# Patient Record
Sex: Female | Born: 1940
Health system: Southern US, Community
[De-identification: ages and names within clinical notes are randomized; demographics above are authoritative.]

## PROBLEM LIST (undated history)

## (undated) DIAGNOSIS — E119 Type 2 diabetes mellitus without complications: Secondary | ICD-10-CM

## (undated) DIAGNOSIS — D649 Anemia, unspecified: Secondary | ICD-10-CM

## (undated) DIAGNOSIS — I2510 Atherosclerotic heart disease of native coronary artery without angina pectoris: Secondary | ICD-10-CM

## (undated) DIAGNOSIS — I219 Acute myocardial infarction, unspecified: Secondary | ICD-10-CM

## (undated) DIAGNOSIS — N186 End stage renal disease: Secondary | ICD-10-CM

## (undated) DIAGNOSIS — E785 Hyperlipidemia, unspecified: Secondary | ICD-10-CM

## (undated) DIAGNOSIS — Z992 Dependence on renal dialysis: Secondary | ICD-10-CM

## (undated) DIAGNOSIS — I10 Essential (primary) hypertension: Secondary | ICD-10-CM

## (undated) DIAGNOSIS — I639 Cerebral infarction, unspecified: Secondary | ICD-10-CM

## (undated) DIAGNOSIS — I509 Heart failure, unspecified: Secondary | ICD-10-CM

## (undated) HISTORY — DX: End stage renal disease: N18.6

## (undated) HISTORY — PX: APPENDECTOMY: SHX54

## (undated) HISTORY — DX: Dependence on renal dialysis: Z99.2

---
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
## 2012-07-17 ENCOUNTER — Inpatient Hospital Stay (HOSPITAL_COMMUNITY): Payer: Medicare Other

## 2012-07-17 ENCOUNTER — Encounter (HOSPITAL_COMMUNITY): Payer: Self-pay | Admitting: Emergency Medicine

## 2012-07-17 ENCOUNTER — Inpatient Hospital Stay (HOSPITAL_COMMUNITY)
Admission: EM | Admit: 2012-07-17 | Discharge: 2012-07-22 | DRG: 481 | Disposition: A | Discharge status: Skilled Nursing Facility | Payer: Medicare Other | Attending: Internal Medicine | Admitting: Internal Medicine

## 2012-07-17 ENCOUNTER — Emergency Department (HOSPITAL_COMMUNITY): Payer: Medicare Other

## 2012-07-17 DIAGNOSIS — I129 Hypertensive chronic kidney disease with stage 1 through stage 4 chronic kidney disease, or unspecified chronic kidney disease: Secondary | ICD-10-CM | POA: Diagnosis present

## 2012-07-17 DIAGNOSIS — N390 Urinary tract infection, site not specified: Secondary | ICD-10-CM | POA: Diagnosis present

## 2012-07-17 DIAGNOSIS — Z8673 Personal history of transient ischemic attack (TIA), and cerebral infarction without residual deficits: Secondary | ICD-10-CM

## 2012-07-17 DIAGNOSIS — S72001A Fracture of unspecified part of neck of right femur, initial encounter for closed fracture: Secondary | ICD-10-CM

## 2012-07-17 DIAGNOSIS — Y92009 Unspecified place in unspecified non-institutional (private) residence as the place of occurrence of the external cause: Secondary | ICD-10-CM

## 2012-07-17 DIAGNOSIS — E876 Hypokalemia: Secondary | ICD-10-CM | POA: Diagnosis not present

## 2012-07-17 DIAGNOSIS — E875 Hyperkalemia: Secondary | ICD-10-CM | POA: Diagnosis present

## 2012-07-17 DIAGNOSIS — D631 Anemia in chronic kidney disease: Secondary | ICD-10-CM | POA: Diagnosis present

## 2012-07-17 DIAGNOSIS — S72141A Displaced intertrochanteric fracture of right femur, initial encounter for closed fracture: Secondary | ICD-10-CM

## 2012-07-17 DIAGNOSIS — R55 Syncope and collapse: Secondary | ICD-10-CM

## 2012-07-17 DIAGNOSIS — W19XXXA Unspecified fall, initial encounter: Secondary | ICD-10-CM | POA: Diagnosis present

## 2012-07-17 DIAGNOSIS — S72143A Displaced intertrochanteric fracture of unspecified femur, initial encounter for closed fracture: Principal | ICD-10-CM | POA: Diagnosis present

## 2012-07-17 DIAGNOSIS — S72009A Fracture of unspecified part of neck of unspecified femur, initial encounter for closed fracture: Secondary | ICD-10-CM

## 2012-07-17 DIAGNOSIS — S72001D Fracture of unspecified part of neck of right femur, subsequent encounter for closed fracture with routine healing: Secondary | ICD-10-CM

## 2012-07-17 DIAGNOSIS — W010XXA Fall on same level from slipping, tripping and stumbling without subsequent striking against object, initial encounter: Secondary | ICD-10-CM

## 2012-07-17 DIAGNOSIS — B961 Klebsiella pneumoniae [K. pneumoniae] as the cause of diseases classified elsewhere: Secondary | ICD-10-CM | POA: Diagnosis present

## 2012-07-17 DIAGNOSIS — I679 Cerebrovascular disease, unspecified: Secondary | ICD-10-CM | POA: Diagnosis present

## 2012-07-17 DIAGNOSIS — I10 Essential (primary) hypertension: Secondary | ICD-10-CM | POA: Diagnosis present

## 2012-07-17 DIAGNOSIS — E119 Type 2 diabetes mellitus without complications: Secondary | ICD-10-CM | POA: Diagnosis present

## 2012-07-17 DIAGNOSIS — N189 Chronic kidney disease, unspecified: Secondary | ICD-10-CM | POA: Diagnosis present

## 2012-07-17 HISTORY — DX: Essential (primary) hypertension: I10

## 2012-07-17 HISTORY — DX: Type 2 diabetes mellitus without complications: E11.9

## 2012-07-17 LAB — CBC
HCT: 29.6 % — ABNORMAL LOW (ref 36.0–46.0)
MCHC: 34.8 g/dL (ref 30.0–36.0)
MCV: 83.4 fL (ref 78.0–100.0)
RDW: 13.2 % (ref 11.5–15.5)
WBC: 6.8 10*3/uL (ref 4.0–10.5)

## 2012-07-17 LAB — PROTIME-INR
INR: 1.03 (ref 0.00–1.49)
Prothrombin Time: 13.4 seconds (ref 11.6–15.2)

## 2012-07-17 LAB — URINALYSIS, ROUTINE W REFLEX MICROSCOPIC
Glucose, UA: NEGATIVE mg/dL
Specific Gravity, Urine: 1.02 (ref 1.005–1.030)

## 2012-07-17 LAB — COMPREHENSIVE METABOLIC PANEL
Albumin: 3.4 g/dL — ABNORMAL LOW (ref 3.5–5.2)
BUN: 20 mg/dL (ref 6–23)
Chloride: 102 mEq/L (ref 96–112)
Creatinine, Ser: 1.75 mg/dL — ABNORMAL HIGH (ref 0.50–1.10)
Total Bilirubin: 0.6 mg/dL (ref 0.3–1.2)

## 2012-07-17 LAB — GLUCOSE, CAPILLARY: Glucose-Capillary: 128 mg/dL — ABNORMAL HIGH (ref 70–99)

## 2012-07-17 LAB — URINE MICROSCOPIC-ADD ON

## 2012-07-17 LAB — TROPONIN I: Troponin I: <0.3 ng/mL (ref <0.30)

## 2012-07-17 MED ORDER — POTASSIUM CHLORIDE IN NACL 20-0.9 MEQ/L-% IV SOLN
INTRAVENOUS | Status: DC
  Administered 2012-07-17 – 2012-07-21 (×5): via INTRAVENOUS

## 2012-07-17 MED ORDER — GLIMEPIRIDE 2 MG PO TABS
2.0000 mg | ORAL_TABLET | Freq: Every day | ORAL | Status: DC
  Administered 2012-07-18 – 2012-07-22 (×5): 2 mg via ORAL
  Filled 2012-07-17 (×8): qty 1

## 2012-07-17 MED ORDER — MORPHINE SULFATE 2 MG/ML IJ SOLN
2.0000 mg | Freq: Once | INTRAMUSCULAR | Status: AC
Start: 2012-07-17 — End: 2012-07-17
  Administered 2012-07-17: 2 mg via INTRAVENOUS
  Filled 2012-07-17: qty 1

## 2012-07-17 MED ORDER — ONDANSETRON HCL 4 MG/2ML IJ SOLN
4.0000 mg | Freq: Once | INTRAMUSCULAR | Status: AC
  Administered 2012-07-17: 4 mg via INTRAVENOUS
  Filled 2012-07-17: qty 2

## 2012-07-17 MED ORDER — HEPARIN SODIUM (PORCINE) 5000 UNIT/ML IJ SOLN
5000.0000 [IU] | Freq: Three times a day (TID) | INTRAMUSCULAR | Status: DC
  Administered 2012-07-17 – 2012-07-18 (×3): 5000 [IU] via SUBCUTANEOUS
  Filled 2012-07-17 (×3): qty 1

## 2012-07-17 MED ORDER — INSULIN ASPART 100 UNIT/ML ~~LOC~~ SOLN: 0.0000 [IU] | Freq: Every day | SUBCUTANEOUS | Status: DC

## 2012-07-17 MED ORDER — AMLODIPINE BESYLATE 5 MG PO TABS
5.0000 mg | ORAL_TABLET | Freq: Every day | ORAL | Status: DC
  Administered 2012-07-18 – 2012-07-22 (×5): 5 mg via ORAL
  Filled 2012-07-17 (×5): qty 1

## 2012-07-17 MED ORDER — LABETALOL HCL 200 MG PO TABS
100.0000 mg | ORAL_TABLET | Freq: Two times a day (BID) | ORAL | Status: DC
  Administered 2012-07-17 – 2012-07-22 (×10): 100 mg via ORAL
  Filled 2012-07-17 (×14): qty 1

## 2012-07-17 MED ORDER — MORPHINE SULFATE 2 MG/ML IJ SOLN
2.0000 mg | Freq: Once | INTRAMUSCULAR | Status: AC
  Administered 2012-07-17: 2 mg via INTRAVENOUS
  Filled 2012-07-17 (×2): qty 1

## 2012-07-17 MED ORDER — OXYCODONE HCL 5 MG PO TABS
5.0000 mg | ORAL_TABLET | ORAL | Status: DC | PRN
  Administered 2012-07-18 (×3): 5 mg via ORAL
  Filled 2012-07-17 (×2): qty 1

## 2012-07-17 MED ORDER — MORPHINE SULFATE 2 MG/ML IJ SOLN
2.0000 mg | INTRAMUSCULAR | Status: DC | PRN
  Administered 2012-07-18 – 2012-07-21 (×8): 2 mg via INTRAVENOUS
  Filled 2012-07-17 (×8): qty 1

## 2012-07-17 MED ORDER — SODIUM CHLORIDE 0.9 % IJ SOLN
3.0000 mL | Freq: Two times a day (BID) | INTRAMUSCULAR | Status: DC
  Administered 2012-07-19 – 2012-07-22 (×6): 3 mL via INTRAVENOUS
  Filled 2012-07-17 (×2): qty 3

## 2012-07-17 MED ORDER — INSULIN ASPART 100 UNIT/ML ~~LOC~~ SOLN
0.0000 [IU] | Freq: Three times a day (TID) | SUBCUTANEOUS | Status: DC
  Administered 2012-07-18: 2 [IU] via SUBCUTANEOUS
  Administered 2012-07-18: 3 [IU] via SUBCUTANEOUS
  Administered 2012-07-19: 2 [IU] via SUBCUTANEOUS
  Administered 2012-07-19: 3 [IU] via SUBCUTANEOUS
  Administered 2012-07-21 – 2012-07-22 (×3): 2 [IU] via SUBCUTANEOUS

## 2012-07-17 NOTE — ED Notes (Signed)
Pt states that she fell this am around 5, unsure of what caused her to fall. C/o pain to right hip area. Cms intact distal.

## 2012-07-17 NOTE — ED Notes (Signed)
Pt states that her pain is starting to return,

## 2012-07-17 NOTE — ED Notes (Signed)
Dr. James Ivanoff at bedside,

## 2012-07-17 NOTE — H&P (Signed)
[facility] History and Physical  Courtney [name] [id number] DOB: Jun 09, 1940 DOA: 07/17/2012  Referring physician: Dr Henreitta Cea PCP: Neale Burly, MD    Chief Complaint: Syncope. Right hip pain.  HPI: Courtney [name] is a 72 y.o. female who had a syncopal episode at 5 AM today. She was in the process of going from her bed to the bathroom when she suddenly lost consciousness. According to her and her son's history, she probably lost consciousness for less than 1 minute. She denies any chest pain, lightheadedness, palpitations with this episode. When she recovered consciousness, she does not remember feeling confused and her son denies that she was confused. She does tell me that she has episodes when she feels her heart stops for a few seconds. She has never had any history of heart disease. 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 She walks with a walker usually. Once she was on the bathroom floor, she was not able to get up to cause she had severe pain in the right hip. She came to the emergency room and x-ray showed fractured right hip. She is diabetic and hypertensive.   Review of Systems: .  Apart from history of present illness, other systems negative.  Past Medical History  Diagnosis Date  . Diabetes mellitus without complication   . Hypertension    Past Surgical History  Procedure Laterality Date  . Appendectomy     Social History:  She never married, lives with her son. She does not drink alcohol. She does not smoke cigarettes. She walks with a walker, seems to be fairly independent.  No Known Allergies  History reviewed. No pertinent family history.  noncontributory.   Prior to Admission medications   Medication Sig Start Date End Date Taking? Authorizing Provider  amLODipine (NORVASC) 5 MG tablet Take 5 mg by mouth daily.   Yes Historical Provider, MD  aspirin 325  MG EC tablet Take 325 mg by mouth daily.   Yes Historical Provider, MD  furosemide (LASIX) 20 MG tablet Take 20 mg by mouth daily.   Yes Historical Provider, MD  glimepiride (AMARYL) 2 MG tablet Take 2 mg by mouth daily.   Yes Historical Provider, MD  labetalol (NORMODYNE) 100 MG tablet Take 100 mg by mouth 2 (two) times daily.   Yes Historical Provider, MD  Omega-3 Fatty Acids (FISH OIL) 1000 MG CAPS Take 1,000 mg by mouth 2 (two) times daily.   Yes Historical Provider, MD   Physical Exam: Filed Vitals:   07/17/12 1519  BP: 172/60  Pulse: 65  Temp: 98.8 F (37.1 C)  TempSrc: Oral  Resp: 18  Height: 5\' 2"  (1.575 m)  Weight: 45.36 kg (100 lb)  SpO2: 100%     General:  She looks systemically well. She is alert.  Eyes: No pallor. No jaundice.  ENT: No abnormalities.  Neck: No lymphadenopathy.  Cardiovascular: Heart sounds are present and in sinus rhythm. There are no murmurs. There is no gallop rhythm. Jugular venous pressure not raised. No carotid bruits.  Respiratory: Lung fields are clear.  Abdomen: Soft, nontender.  Skin: No rash.  Musculoskeletal: Right leg is shortened and externally rotated, consistent with right hip fracture.  Psychiatric: Appropriate affect.  Neurologic: Weakness in the right arm and right leg consistent with previous CVA. No cranial nerve abnormalities.  Labs on Admission:  Basic Metabolic Panel:  Recent Labs Lab 07/17/12  1550  NA 140  K 3.4*  CL 102  CO2 27  GLUCOSE 211*  BUN 20  CREATININE 1.75*  CALCIUM 9.2   Liver Function Tests:  Recent Labs Lab 07/17/12 1550  AST 22  ALT 18  ALKPHOS 111  BILITOT 0.6  PROT 7.2  ALBUMIN 3.4*     CBC:  Recent Labs Lab 07/17/12 1550  WBC 6.8  HGB 10.3*  HCT 29.6*  MCV 83.4  PLT 193      Radiological Exams on Admission: Dg Chest 1 View  07/17/2012   *RADIOLOGY REPORT*  Clinical Data: Right hip fracture from a fall.  CHEST - 1 VIEW  Comparison: None.  Findings: Poor  inspiration.  Mildly enlarged cardiac silhouette with prominent pulmonary vasculature.  Mildly prominent interstitial markings.  The patient's chin is overlying the upper right lung apex.  Thoracic spine degenerative changes.  Diffuse osteopenia.  IMPRESSION:  1.  Pulmonary vascular congestion and mild cardiomegaly. 2.  Mild chronic interstitial lung disease.   Original Report Authenticated By: Claudie Revering, M.D.   Dg Hip Complete Right  07/17/2012   *RADIOLOGY REPORT*  Clinical Data: Right hip pain following a fall.  RIGHT HIP - COMPLETE 2+ VIEW  Comparison: None.  Findings: Right upper intertrochanteric fracture with mild impaction and mild varus angulation.  Right femoral head and neck junction spur formation.  Diffuse osteopenia.  Right pelvic surgical clips.  IMPRESSION:  1.  Right intertrochanteric fracture, as described above. 2.  Mild to moderate right hip degenerative changes.   Original Report Authenticated By: Claudie Revering, M.D.    EKG: Independently reviewed. Normal sinus rhythm, partial right bundle-branch block, nonspecific T wave changes laterally. There are no acute ST-T wave changes such as ST elevation.  Assessment/Plan Active Problems:   Syncope   Closed right hip fracture   HTN (hypertension)   DM (diabetes mellitus)   Cerebrovascular disease   1. Syncope lasting less than 1 minute. Unclear etiology. 2. Right hip fracture. 3. Hypertension. 4. Type 2 diabetes mellitus. 5. Cerebrovascular disease with history of right-sided weakness. 6. Renal insufficiency,? Acute or chronic. 7. Hypokalemia.  Plan: 1. Admit to telemetry. 2.Serial cardiac enzymes. 3.Echocardiogram. 4. Bilateral carotid Dopplers. 5. Hold aspirin for the time being. 6. IV fluids. 7. Replete potassium. 8. Orthopedic consultation. Dr. Aline Brochure, orthopedics, is aware of the patient. Further recommendations will depend on patient's hospital progress.   Code Status: Full code.  Family Communication:  Discussed plan with patient at the bedside.   Disposition Plan: Will likely need disposition to rehabilitation.   Time spent: 45 minutes.  Doree Albee [facility] Pager [phone].  If 7PM-7AM, please contact night-coverage [email or web address] Password [email or web address] 07/17/2012, 5:30 PM

## 2012-07-17 NOTE — ED Provider Notes (Signed)
History    This chart was scribed for Mirna Mires, MD by Ludger Nutting, ED Scribe. This patient was seen in room APA14/[facility]14 and the patient's care was started 3:20 PM.   CSN: [id number]  Arrival date & time 07/17/12  1502   First MD Initiated Contact with Patient 07/17/12 1512      Chief Complaint  Patient presents with  . Fall  . Hip Pain     The history is provided by the patient and a relative. No language interpreter was used.    HPI Comments: Courtney [name] is a 72 y.o. female who presents to the Emergency Department complaining of a fall that occurred this morning at 5 am. States she does not use a walker going from her bedroom to her bathroom. Reports the fall was not witnessed but someone in the house heard her yell out. She complains of R hip pain and swelling. She denies hitting her head or having any LOC. She does not take any blood thinner medication. She denies chest pain, abdominal pain, vomiting. Pt has h/o DM, HTN. Denies faintness or syncope. No neck or back pain. No numbness/weakness.      Past Medical History  Diagnosis Date  . Diabetes mellitus without complication   . Hypertension     Past Surgical History  Procedure Laterality Date  . Appendectomy      History reviewed. No pertinent family history.  History  Substance Use Topics  . Smoking status: Not on file  . Smokeless tobacco: Not on file  . Alcohol Use: No    OB History   Grav Para Term Preterm Abortions TAB SAB Ect Mult Living                  Review of Systems  Constitutional: Negative for fever and chills.  HENT: Negative for neck pain.   Eyes: Negative for visual disturbance.  Respiratory: Negative for shortness of breath.   Cardiovascular: Negative for chest pain.  Gastrointestinal: Negative for vomiting and abdominal pain.  Genitourinary: Negative for flank pain.  Musculoskeletal: Positive for joint swelling and arthralgias (hip pain).  Skin: Negative for wound.   Neurological: Negative for weakness, numbness and headaches.  Hematological: Does not bruise/bleed easily.  Psychiatric/Behavioral: Negative for agitation.  All other systems reviewed and are negative.    Allergies  Review of patient's allergies indicates no known allergies.  Home Medications  No current outpatient prescriptions on file.  There were no vitals taken for this visit.  Physical Exam  Nursing note and vitals reviewed. Constitutional: She is oriented to person, place, and time. She appears well-developed and well-nourished. No distress.  HENT:  Head: Atraumatic.  Mouth/Throat: Oropharynx is clear and moist.  Eyes: Conjunctivae and EOM are normal. Pupils are equal, round, and reactive to light. No scleral icterus.  Neck: Normal range of motion. Neck supple. No tracheal deviation present.  Cardiovascular: Normal rate, regular rhythm, normal heart sounds and intact distal pulses.   Pulmonary/Chest: Effort normal and breath sounds normal. No respiratory distress. She exhibits no tenderness.  Abdominal: Soft. Normal appearance. She exhibits no distension. There is no tenderness.  Genitourinary:  No cva tenderness  Musculoskeletal: Normal range of motion. She exhibits tenderness. She exhibits no edema.  R leg flexed at the hip and shortened. R hip tenderness. Distal pulses palp bil ext.  CTLS spine, non tender, aligned, no step off.   Neurological: She is alert and oriented to person, place, and time.  bil ext,  motor intact, 5/5.   Skin: Skin is warm and dry. No rash noted.  Psychiatric: She has a normal mood and affect.    ED Course  Procedures (including critical care time)  DIAGNOSTIC STUDIES: Oxygen Saturation is 100% on room air, normal by my interpretation.    COORDINATION OF CARE: 3:28 PM Discussed treatment plan with pt at bedside and pt agreed to plan.   Results for orders placed during the hospital encounter of 07/17/12  CBC      Result Value Range    WBC 6.8  4.0 - 10.5 K/uL   RBC 3.55 (*) 3.87 - 5.11 MIL/uL   Hemoglobin 10.3 (*) 12.0 - 15.0 g/dL   HCT 29.6 (*) 36.0 - 46.0 %   MCV 83.4  78.0 - 100.0 fL   MCH 29.0  26.0 - 34.0 pg   MCHC 34.8  30.0 - 36.0 g/dL   RDW 13.2  11.5 - 15.5 %   Platelets 193  150 - 400 K/uL  COMPREHENSIVE METABOLIC PANEL      Result Value Range   Sodium 140  135 - 145 mEq/L   Potassium 3.4 (*) 3.5 - 5.1 mEq/L   Chloride 102  96 - 112 mEq/L   CO2 27  19 - 32 mEq/L   Glucose, Bld 211 (*) 70 - 99 mg/dL   BUN 20  6 - 23 mg/dL   Creatinine, Ser 1.75 (*) 0.50 - 1.10 mg/dL   Calcium 9.2  8.4 - 10.5 mg/dL   Total Protein 7.2  6.0 - 8.3 g/dL   Albumin 3.4 (*) 3.5 - 5.2 g/dL   AST 22  0 - 37 U/L   ALT 18  0 - 35 U/L   Alkaline Phosphatase 111  39 - 117 U/L   Total Bilirubin 0.6  0.3 - 1.2 mg/dL   GFR calc non Af Amer 28 (*) >90 mL/min   GFR calc Af Amer 33 (*) >90 mL/min  PROTIME-INR      Result Value Range   Prothrombin Time 13.4  11.6 - 15.2 seconds   INR 1.03  0.00 - 1.49  TYPE AND SCREEN      Result Value Range   ABO/RH(D) A POS     Antibody Screen NEG     Sample Expiration 07/20/2012     Dg Chest 1 View  07/17/2012   *RADIOLOGY REPORT*  Clinical Data: Right hip fracture from a fall.  CHEST - 1 VIEW  Comparison: None.  Findings: Poor inspiration.  Mildly enlarged cardiac silhouette with prominent pulmonary vasculature.  Mildly prominent interstitial markings.  The patient's chin is overlying the upper right lung apex.  Thoracic spine degenerative changes.  Diffuse osteopenia.  IMPRESSION:  1.  Pulmonary vascular congestion and mild cardiomegaly. 2.  Mild chronic interstitial lung disease.   Original Report Authenticated By: Claudie Revering, M.D.   Dg Hip Complete Right  07/17/2012   *RADIOLOGY REPORT*  Clinical Data: Right hip pain following a fall.  RIGHT HIP - COMPLETE 2+ VIEW  Comparison: None.  Findings: Right upper intertrochanteric fracture with mild impaction and mild varus angulation.  Right femoral  head and neck junction spur formation.  Diffuse osteopenia.  Right pelvic surgical clips.  IMPRESSION:  1.  Right intertrochanteric fracture, as described above. 2.  Mild to moderate right hip degenerative changes.   Original Report Authenticated By: Claudie Revering, M.D.       MDM  I personally performed the services described in this documentation, which was  scribed in my presence. The recorded information has been reviewed and is accurate.  Iv ns.  Morphine iv.   recheck pain improved.  Ortho md called, hospitalist called.   Will admit w hip fx temp order set.   Date: 07/17/2012  Rate: 63  Rhythm: normal sinus rhythm  QRS Axis: normal  Intervals: normal  ST/T Wave abnormalities: nonspecific ST/T changes  Conduction Disutrbances:IRBBB  Narrative Interpretation:   Old EKG Reviewed: none available     Mirna Mires, MD 07/17/12 1651

## 2012-07-17 NOTE — ED Notes (Signed)
Pt states that the pain became worse when she was being moved, is better now that she has returned to er

## 2012-07-17 NOTE — ED Notes (Signed)
Dr Steinl at bedside,  

## 2012-07-17 NOTE — ED Notes (Signed)
When asked about what made pt fall, pt states "everything went black".

## 2012-07-17 NOTE — ED Notes (Signed)
Pt has been unable to walk since fall. Pt just stated " everything went black" then she fell. Pt fell on hardwood. Pedal pulses strong.

## 2012-07-17 NOTE — ED Notes (Signed)
Pt fell in home around 5am. Was not witnessed but someone in house heard her hollar. Pt has small abrasion noted above r eyebrow. Pt c/o r hip pain. Nad. Denies LOC.swelling noted to r lateral upper femur area

## 2012-07-17 NOTE — ED Notes (Signed)
Pt states that she is comfortable at present, states "I think I will hold off on additional pain medication"

## 2012-07-18 ENCOUNTER — Inpatient Hospital Stay (HOSPITAL_COMMUNITY): Payer: Medicare Other

## 2012-07-18 ENCOUNTER — Encounter (HOSPITAL_COMMUNITY): Payer: Self-pay | Admitting: Orthopedic Surgery

## 2012-07-18 DIAGNOSIS — E119 Type 2 diabetes mellitus without complications: Secondary | ICD-10-CM

## 2012-07-18 DIAGNOSIS — S72143A Displaced intertrochanteric fracture of unspecified femur, initial encounter for closed fracture: Principal | ICD-10-CM

## 2012-07-18 LAB — COMPREHENSIVE METABOLIC PANEL
AST: 17 U/L (ref 0–37)
Albumin: 2.9 g/dL — ABNORMAL LOW (ref 3.5–5.2)
BUN: 17 mg/dL (ref 6–23)
CO2: 24 mEq/L (ref 19–32)
Calcium: 8.1 mg/dL — ABNORMAL LOW (ref 8.4–10.5)
Creatinine, Ser: 1.73 mg/dL — ABNORMAL HIGH (ref 0.50–1.10)
GFR calc non Af Amer: 29 mL/min — ABNORMAL LOW (ref >90)

## 2012-07-18 LAB — CBC
Hemoglobin: 9.1 g/dL — ABNORMAL LOW (ref 12.0–15.0)
MCH: 28.9 pg (ref 26.0–34.0)
RBC: 3.15 MIL/uL — ABNORMAL LOW (ref 3.87–5.11)
WBC: 5.1 10*3/uL (ref 4.0–10.5)

## 2012-07-18 LAB — TROPONIN I
Troponin I: <0.3 ng/mL (ref <0.30)
Troponin I: <0.3 ng/mL (ref <0.30)

## 2012-07-18 LAB — GLUCOSE, CAPILLARY
Glucose-Capillary: 85 mg/dL (ref 70–99)
Glucose-Capillary: 138 mg/dL — ABNORMAL HIGH (ref 70–99)
Glucose-Capillary: 154 mg/dL — ABNORMAL HIGH (ref 70–99)
Glucose-Capillary: 149 mg/dL — ABNORMAL HIGH (ref 70–99)

## 2012-07-18 LAB — ABO/RH: ABO/RH(D): A POS

## 2012-07-18 LAB — TSH: TSH: 1.514 u[IU]/mL (ref 0.350–4.500)

## 2012-07-18 MED ORDER — POTASSIUM CHLORIDE CRYS ER 20 MEQ PO TBCR
20.0000 meq | EXTENDED_RELEASE_TABLET | Freq: Two times a day (BID) | ORAL | Status: DC
  Administered 2012-07-18 – 2012-07-19 (×3): 20 meq via ORAL
  Filled 2012-07-18 (×3): qty 1

## 2012-07-18 MED ORDER — HEPARIN SODIUM (PORCINE) 5000 UNIT/ML IJ SOLN
5000.0000 [IU] | Freq: Three times a day (TID) | INTRAMUSCULAR | Status: DC
  Administered 2012-07-18 – 2012-07-19 (×3): 5000 [IU] via SUBCUTANEOUS
  Filled 2012-07-18 (×3): qty 1

## 2012-07-18 MED ORDER — DEXTROSE 5 % IV SOLN
1.0000 g | INTRAVENOUS | Status: DC
  Administered 2012-07-18 – 2012-07-21 (×4): 1 g via INTRAVENOUS
  Filled 2012-07-18 (×5): qty 10

## 2012-07-18 NOTE — Progress Notes (Signed)
Courtney [name] [id number] DOB: 1940-12-03 DOA: 07/17/2012 PCP: Neale Burly, MD   Subjective: This lady has had no further syncopal episodes or loss of consciousness. She has pain in the right hip from her fracture.           Physical Exam: Blood pressure 129/73, pulse 60, temperature 98.1 F (36.7 C), temperature source Oral, resp. rate 18, height 5\' 2"  (1.575 m), weight 45.36 kg (100 lb), SpO2 100.00%. She looks systemically well. Heart sounds are present and normal. Lung fields are clear. She is alert and oriented. The right leg is shortened and externally rotated, consistent with a right hip fracture.   Investigations:  No results found for this or any previous visit (from the past 240 hour(s)).   Basic Metabolic Panel:  Recent Labs  07/17/12 1550 07/18/12 0633  NA 140 139  K 3.4* 3.6  CL 102 106  CO2 27 24  GLUCOSE 211* 106*  BUN 20 17  CREATININE 1.75* 1.73*  CALCIUM 9.2 8.1*   Liver Function Tests:  Recent Labs  07/17/12 1550 07/18/12 0633  AST 22 17  ALT 18 14  ALKPHOS 111 94  BILITOT 0.6 0.6  PROT 7.2 5.8*  ALBUMIN 3.4* 2.9*     CBC:  Recent Labs  07/17/12 1550 07/18/12 0633  WBC 6.8 5.1  HGB 10.3* 9.1*  HCT 29.6* 26.5*  MCV 83.4 84.1  PLT 193 178    Dg Chest 1 View  07/17/2012   *RADIOLOGY REPORT*  Clinical Data: Right hip fracture from a fall.  CHEST - 1 VIEW  Comparison: None.  Findings: Poor inspiration.  Mildly enlarged cardiac silhouette with prominent pulmonary vasculature.  Mildly prominent interstitial markings.  The patient's chin is overlying the upper right lung apex.  Thoracic spine degenerative changes.  Diffuse osteopenia.  IMPRESSION:  1.  Pulmonary vascular congestion and mild cardiomegaly. 2.  Mild chronic interstitial lung disease.   Original Report Authenticated By: Claudie Revering, M.D.   Dg Hip Complete Right  07/17/2012   *RADIOLOGY REPORT*  Clinical Data: Right hip pain following a fall.  RIGHT HIP - COMPLETE 2+  VIEW  Comparison: None.  Findings: Right upper intertrochanteric fracture with mild impaction and mild varus angulation.  Right femoral head and neck junction spur formation.  Diffuse osteopenia.  Right pelvic surgical clips.  IMPRESSION:  1.  Right intertrochanteric fracture, as described above. 2.  Mild to moderate right hip degenerative changes.   Original Report Authenticated By: Claudie Revering, M.D.      Medications: I have reviewed the patient's current medications.  Impression: 1. Syncope, serial cardiac enzymes negative . Further workup is still pending. 2. Possible UTI on urinalysis. Urine culture pending. 3. Closed right hip fracture, awaiting orthopedic consultation. 4. Hypertension. 5. Type 2 diabetes mellitus. 6. History of cerebrovascular disease. No new CVA clinically.  7. Renal insufficiency, acute versus chronic.     Plan: 1. Ultrasound of the kidneys. 2. Continue with IV fluids. 3. Await echocardiogram and carotid Dopplers. 4. Await orthopedic consultation. Consultants:  Orthopedics consulted.   Procedures:  None.   Antibiotics:  Ceftriaxone IV started today, 07/18/2012.                   Code Status: Full code.  Family Communication: Discussed plan with patient at the bedside.   Disposition Plan: Depending on progress.  Time spent: 20 minutes .   LOS: 1 day   Doree Albee Pager [phone]  07/18/2012, 8:17 AM

## 2012-07-18 NOTE — Consult Note (Addendum)
Reason for Consult: Fractured right hip Referring Physician: Dr. [name] is an 72 y.o. female.  HPI: Courtney [name] is a 72 y.o. female who had a syncopal episode at 5 AM today. She was in the process of going from her bed to the bathroom when she suddenly lost consciousness. According to her and her son's history, she probably lost consciousness for less than 1 minute. She denies any chest pain, lightheadedness, palpitations with this episode. When she recovered consciousness, she does not remember feeling confused and her son denies that she was confused. She does tell me that she has episodes when she feels her heart stops for a few seconds. She has never had any history of heart disease. 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 She walks with a walker usually.  Once she was on the bathroom floor, she was not able to get up to cause she had severe pain in the right hip. She came to the emergency room and x-ray showed fractured right hip. She is diabetic and hypertensive.    Past Medical History  Diagnosis Date  . Diabetes mellitus without complication   . Hypertension     Past Surgical History  Procedure Laterality Date  . Appendectomy      Family History  Problem Relation Age of Onset  . Heart disease Mother   . Heart disease Father     Social History:  reports that she has never smoked. She does not have any smokeless tobacco history on file. She reports that she does not drink alcohol or use illicit drugs.  Allergies: No Known Allergies  Medications: I have reviewed the patient's current medications.  Results for orders placed during the hospital encounter of 07/17/12 (from the past 48 hour(s))  CBC     Status: Abnormal   Collection Time    07/17/12  3:50 PM      Result Value Range   WBC 6.8  4.0 - 10.5 K/uL   RBC 3.55 (*) 3.87 - 5.11 MIL/uL   Hemoglobin 10.3  (*) 12.0 - 15.0 g/dL   HCT 29.6 (*) 36.0 - 46.0 %   MCV 83.4  78.0 - 100.0 fL   MCH 29.0  26.0 - 34.0 pg   MCHC 34.8  30.0 - 36.0 g/dL   RDW 13.2  11.5 - 15.5 %   Platelets 193  150 - 400 K/uL  COMPREHENSIVE METABOLIC PANEL     Status: Abnormal   Collection Time    07/17/12  3:50 PM      Result Value Range   Sodium 140  135 - 145 mEq/L   Potassium 3.4 (*) 3.5 - 5.1 mEq/L   Chloride 102  96 - 112 mEq/L   CO2 27  19 - 32 mEq/L   Glucose, Bld 211 (*) 70 - 99 mg/dL   BUN 20  6 - 23 mg/dL   Creatinine, Ser 1.75 (*) 0.50 - 1.10 mg/dL   Calcium 9.2  8.4 - 10.5 mg/dL   Total Protein 7.2  6.0 - 8.3 g/dL   Albumin 3.4 (*) 3.5 - 5.2 g/dL   AST 22  0 - 37 U/L   ALT 18  0 - 35 U/L   Alkaline Phosphatase 111  39 - 117 U/L   Total Bilirubin 0.6  0.3 - 1.2 mg/dL   GFR calc non Af Amer 28 (*) >  90 mL/min   GFR calc Af Amer 33 (*) >90 mL/min   Comment:            The eGFR has been calculated     using the CKD EPI equation.     This calculation has not been     validated in all clinical     situations.     eGFR's persistently     <90 mL/min signify     possible Chronic Kidney Disease.  PROTIME-INR     Status: None   Collection Time    07/17/12  3:50 PM      Result Value Range   Prothrombin Time 13.4  11.6 - 15.2 seconds   INR 1.03  0.00 - 1.49  TYPE AND SCREEN     Status: None   Collection Time    07/17/12  3:50 PM      Result Value Range   ABO/RH(D) A POS     Antibody Screen NEG     Sample Expiration 07/20/2012    URINALYSIS, ROUTINE W REFLEX MICROSCOPIC     Status: Abnormal   Collection Time    07/17/12  5:51 PM      Result Value Range   Color, Urine YELLOW  YELLOW   APPearance CLOUDY (*) CLEAR   Specific Gravity, Urine 1.020  1.005 - 1.030   pH 5.5  5.0 - 8.0   Glucose, UA NEGATIVE  NEGATIVE mg/dL   Hgb urine dipstick SMALL (*) NEGATIVE   Bilirubin Urine NEGATIVE  NEGATIVE   Ketones, ur NEGATIVE  NEGATIVE mg/dL   Protein, ur 30 (*) NEGATIVE mg/dL   Urobilinogen, UA 0.2   0.0 - 1.0 mg/dL   Nitrite NEGATIVE  NEGATIVE   Leukocytes, UA SMALL (*) NEGATIVE  URINE MICROSCOPIC-ADD ON     Status: Abnormal   Collection Time    07/17/12  5:51 PM      Result Value Range   Squamous Epithelial / LPF FEW (*) RARE   WBC, UA TOO NUMEROUS TO COUNT  <3 WBC/hpf   RBC / HPF 7-10  <3 RBC/hpf   Bacteria, UA MANY (*) RARE  TROPONIN I     Status: None   Collection Time    07/17/12  6:50 PM      Result Value Range   Troponin I <0.30  <0.30 ng/mL   Comment:            Due to the release kinetics of cTnI,     a negative result within the first hours     of the onset of symptoms does not rule out     myocardial infarction with certainty.     If myocardial infarction is still suspected,     repeat the test at appropriate intervals.  GLUCOSE, CAPILLARY     Status: Abnormal   Collection Time    07/17/12  8:50 PM      Result Value Range   Glucose-Capillary 128 (*) 70 - 99 mg/dL  TROPONIN I     Status: None   Collection Time    07/17/12 11:16 PM      Result Value Range   Troponin I <0.30  <0.30 ng/mL   Comment:            Due to the release kinetics of cTnI,     a negative result within the first hours     of the onset of symptoms does not rule out     myocardial infarction  with certainty.     If myocardial infarction is still suspected,     repeat the test at appropriate intervals.  TROPONIN I     Status: None   Collection Time    07/18/12  6:33 AM      Result Value Range   Troponin I <0.30  <0.30 ng/mL   Comment:            Due to the release kinetics of cTnI,     a negative result within the first hours     of the onset of symptoms does not rule out     myocardial infarction with certainty.     If myocardial infarction is still suspected,     repeat the test at appropriate intervals.  COMPREHENSIVE METABOLIC PANEL     Status: Abnormal   Collection Time    07/18/12  6:33 AM      Result Value Range   Sodium 139  135 - 145 mEq/L   Potassium 3.6  3.5 - 5.1  mEq/L   Chloride 106  96 - 112 mEq/L   CO2 24  19 - 32 mEq/L   Glucose, Bld 106 (*) 70 - 99 mg/dL   BUN 17  6 - 23 mg/dL   Creatinine, Ser 1.73 (*) 0.50 - 1.10 mg/dL   Calcium 8.1 (*) 8.4 - 10.5 mg/dL   Total Protein 5.8 (*) 6.0 - 8.3 g/dL   Albumin 2.9 (*) 3.5 - 5.2 g/dL   AST 17  0 - 37 U/L   ALT 14  0 - 35 U/L   Alkaline Phosphatase 94  39 - 117 U/L   Total Bilirubin 0.6  0.3 - 1.2 mg/dL   GFR calc non Af Amer 29 (*) >90 mL/min   GFR calc Af Amer 33 (*) >90 mL/min   Comment:            The eGFR has been calculated     using the CKD EPI equation.     This calculation has not been     validated in all clinical     situations.     eGFR's persistently     <90 mL/min signify     possible Chronic Kidney Disease.  CBC     Status: Abnormal   Collection Time    07/18/12  6:33 AM      Result Value Range   WBC 5.1  4.0 - 10.5 K/uL   RBC 3.15 (*) 3.87 - 5.11 MIL/uL   Hemoglobin 9.1 (*) 12.0 - 15.0 g/dL   HCT 26.5 (*) 36.0 - 46.0 %   MCV 84.1  78.0 - 100.0 fL   MCH 28.9  26.0 - 34.0 pg   MCHC 34.3  30.0 - 36.0 g/dL   RDW 13.3  11.5 - 15.5 %   Platelets 178  150 - 400 K/uL  GLUCOSE, CAPILLARY     Status: None   Collection Time    07/18/12  7:44 AM      Result Value Range   Glucose-Capillary 85  70 - 99 mg/dL    Dg Chest 1 View  07/17/2012   *RADIOLOGY REPORT*  Clinical Data: Right hip fracture from a fall.  CHEST - 1 VIEW  Comparison: None.  Findings: Poor inspiration.  Mildly enlarged cardiac silhouette with prominent pulmonary vasculature.  Mildly prominent interstitial markings.  The patient's chin is overlying the upper right lung apex.  Thoracic spine degenerative changes.  Diffuse osteopenia.  IMPRESSION:  1.  Pulmonary vascular congestion and mild cardiomegaly. 2.  Mild chronic interstitial lung disease.   Original Report Authenticated By: Claudie Revering, M.D.   Dg Hip Complete Right  07/17/2012   *RADIOLOGY REPORT*  Clinical Data: Right hip pain following a fall.  RIGHT HIP  - COMPLETE 2+ VIEW  Comparison: None.  Findings: Right upper intertrochanteric fracture with mild impaction and mild varus angulation.  Right femoral head and neck junction spur formation.  Diffuse osteopenia.  Right pelvic surgical clips.  IMPRESSION:  1.  Right intertrochanteric fracture, as described above. 2.  Mild to moderate right hip degenerative changes.   Original Report Authenticated By: Claudie Revering, M.D.   US Carotid Duplex Bilateral  07/18/2012   *RADIOLOGY REPORT*  Clinical Data: Stroke, hypertension, history of diabetes  BILATERAL CAROTID DUPLEX ULTRASOUND  Technique: Pearline Cables scale imaging, color Doppler and duplex ultrasound was performed of bilateral carotid and vertebral arteries in the neck.  Comparison:  None.  Criteria:  Quantification of carotid stenosis is based on velocity parameters that correlate the residual internal carotid diameter with NASCET-based stenosis levels, using the diameter of the distal internal carotid lumen as the denominator for stenosis measurement.  The following velocity measurements were obtained:                   PEAK SYSTOLIC/END DIASTOLIC RIGHT ICA:                        76/17cm/sec CCA:                        [id number] SYSTOLIC ICA/CCA RATIO:     [id number] DIASTOLIC ICA/CCA RATIO:    1.91 ECA:                        119cm/sec  LEFT ICA:                        80/21cm/sec CCA:                        [id number] SYSTOLIC ICA/CCA RATIO:     [id number] DIASTOLIC ICA/CCA RATIO:    1.9 ECA:                        93cm/sec  Findings:  RIGHT CAROTID ARTERY: There is a very minimal amount of intimal Schank thickening within the right carotid bulb (image 17), not resulting elevated peak systolic velocities within the right internal carotid artery to suggest a hemodynamically significant stenosis.  RIGHT VERTEBRAL ARTERY:  Antegrade flow  LEFT CAROTID ARTERY: There is a minimal amount of intimal Parham thickening within the left carotid bulb (images 49 and 51), not resulting in elevated  peak systolic velocities within the left internal carotid artery to suggest a hemodynamically significant stenosis.  LEFT VERTEBRAL ARTERY:  Antegrade flow  Incidental note is made of a sub centimeter (approximately 4 mm) anechoic nodule within the right lobe of the thyroid (image 2).  IMPRESSION:  1.  Minimal amount of bilateral intimal Hoogendoorn thickening, not resulting in hemodynamically significant stenosis. 2.  Incidental note made of a sub centimeter anechoic nodule within the right lobe of the thyroid.  Further evaluation with dedicated thyroid ultrasound may be obtained as clinically indicated.   Original Report Authenticated By: Jake Seats, MD    ROS Blood pressure 129/73, pulse 60,  temperature 98.1 F (36.7 C), temperature source Oral, resp. rate 18, height 5\' 2"  (1.575 m), weight 100 lb (45.36 kg), SpO2 100.00%. Physical Exam General the patient appears to have a small frame and a protuberant abdomen but no distention no tenderness. She has normal development grooming and hygiene her right leg is in external rotation her left leg is normal. Cardiovascular assessment normal pulses without edema swelling or temperature change Skin warm dry and intact without rash lesion or ulceration Lymph no palpable lymph nodes in the groin or axilla Ambulation patient nonambulatory secondary to fractured right hip Neurologic assessment the patient is awake alert and oriented x3 mood and affect are normal gross neurologic function normal as well.  Musculoskeletal. Upper extremity exam  The right and left upper extremity:   Inspection and palpation revealed no abnormalities in the upper extremities.   Range of motion is full without contracture.  Motor exam is normal with grade 5 strength.  The joints are fully reduced without subluxation.  There is no atrophy or tremor and muscle tone is normal.  All joints are stable.   Left Lower extremity exam Inspection and palpation revealed no tenderness  or abnormality in alignment in the lower extremities. Range of motion is full.  Strength is grade 5. All joints are stable.  Right lower extremity is in external rotation. Range of motion cannot assess Muscle tone normal Joint without subluxation the ankle or hip   Assessment/Plan: The patient has a history of cerebrovascular accident, syncope which is currently being worked up. She's currently anemic from her fracture. She has had some hypokalemia.  So our plan at this time is to await the cardiac workup, start blood transfusion to and make sure that there is adequate potassium on board. Anticoagulation should be limited to last dose at 2 PM each day  I will reassess the patient on a daily basis to determine based on her medical workup when she can have surgery which will be gamma nail fixation of the right hip  Arther Abbott 07/18/2012, 9:57 AM

## 2012-07-19 DIAGNOSIS — R55 Syncope and collapse: Secondary | ICD-10-CM

## 2012-07-19 LAB — CBC
MCV: 85.2 fL (ref 78.0–100.0)
Platelets: 170 10*3/uL (ref 150–400)
RBC: 4.2 MIL/uL (ref 3.87–5.11)
RDW: 13.9 % (ref 11.5–15.5)
WBC: 6.4 10*3/uL (ref 4.0–10.5)

## 2012-07-19 LAB — GLUCOSE, CAPILLARY
Glucose-Capillary: 102 mg/dL — ABNORMAL HIGH (ref 70–99)
Glucose-Capillary: 135 mg/dL — ABNORMAL HIGH (ref 70–99)
Glucose-Capillary: 157 mg/dL — ABNORMAL HIGH (ref 70–99)

## 2012-07-19 LAB — BASIC METABOLIC PANEL
CO2: 23 mEq/L (ref 19–32)
Calcium: 8.4 mg/dL (ref 8.4–10.5)
GFR calc Af Amer: 32 mL/min — ABNORMAL LOW (ref >90)
GFR calc non Af Amer: 28 mL/min — ABNORMAL LOW (ref >90)
Sodium: 141 mEq/L (ref 135–145)

## 2012-07-19 MED ORDER — ONDANSETRON HCL 4 MG PO TABS
4.0000 mg | ORAL_TABLET | Freq: Three times a day (TID) | ORAL | Status: DC | PRN
  Administered 2012-07-19: 4 mg via ORAL
  Filled 2012-07-19: qty 1

## 2012-07-19 MED ORDER — POTASSIUM CHLORIDE CRYS ER 10 MEQ PO TBCR
10.0000 meq | EXTENDED_RELEASE_TABLET | Freq: Two times a day (BID) | ORAL | Status: DC
  Administered 2012-07-19 – 2012-07-20 (×3): 10 meq via ORAL
  Filled 2012-07-19 (×3): qty 1

## 2012-07-19 NOTE — Clinical Social Work Placement (Signed)
Clinical Social Work Department CLINICAL SOCIAL WORK PLACEMENT NOTE 07/19/2012  Patient:  ALMADELIA, UMANA  Account Number:  [id number] Admit date:  07/17/2012  Clinical Social Worker:  Benay Pike, LCSW  Date/time:  07/19/2012 03:05 PM  Clinical Social Work is seeking post-discharge placement for this patient at the following level of care:   SKILLED NURSING   (*CSW will update this form in Epic as items are completed)   07/19/2012  Patient/family provided with [facility] Department of Clinical Social Work's list of facilities offering this level of care within the geographic area requested by the patient (or if unable, by the patient's family).  07/19/2012  Patient/family informed of their freedom to choose among providers that offer the needed level of care, that participate in Medicare, Medicaid or managed care program needed by the patient, have an available bed and are willing to accept the patient.  07/19/2012  Patient/family informed of [facility]' ownership interest in [facility], as well as of the fact that they are under no obligation to receive care at this facility.  PASARR submitted to EDS on 07/19/2012 PASARR number received from EDS on 07/19/2012  FL2 transmitted to all facilities in geographic area requested by pt/family on  07/19/2012 FL2 transmitted to all facilities within larger geographic area on   Patient informed that his/her managed care company has contracts with or will negotiate with  certain facilities, including the following:     Patient/family informed of bed offers received:   Patient chooses bed at  Physician recommends and patient chooses bed at    Patient to be transferred to  on   Patient to be transferred to facility by   The following physician request were entered in Epic:   Additional Comments:  Benay Pike, [place]

## 2012-07-19 NOTE — Progress Notes (Signed)
Courtney [name] [id number] DOB: 10/09/1940 DOA: 07/17/2012 PCP: Neale Burly, MD   Subjective: This lady has had no further syncopal episodes or loss of consciousness. She has pain in the right hip from her fracture.           Physical Exam: Blood pressure 153/59, pulse 65, temperature 98 F (36.7 C), temperature source Oral, resp. rate 15, height 5\' 2"  (1.575 m), weight 45.36 kg (100 lb), SpO2 99.00%. She looks systemically well. Heart sounds are present and normal. Lung fields are clear. She is alert and oriented. The right leg is shortened and externally rotated, consistent with a right hip fracture.        Basic Metabolic Panel:  Recent Labs  07/18/12 0633 07/19/12 0619  NA 139 141  K 3.6 4.9  CL 106 111  CO2 24 23  GLUCOSE 106* 109*  BUN 17 16  CREATININE 1.73* 1.78*  CALCIUM 8.1* 8.4   Liver Function Tests:  Recent Labs  07/17/12 1550 07/18/12 0633  AST 22 17  ALT 18 14  ALKPHOS 111 94  BILITOT 0.6 0.6  PROT 7.2 5.8*  ALBUMIN 3.4* 2.9*     CBC:  Recent Labs  07/18/12 0633 07/19/12 0619  WBC 5.1 6.4  HGB 9.1* 12.0  HCT 26.5* 35.8*  MCV 84.1 85.2  PLT 178 170    Dg Chest 1 View  07/17/2012   *RADIOLOGY REPORT*  Clinical Data: Right hip fracture from a fall.  CHEST - 1 VIEW  Comparison: None.  Findings: Poor inspiration.  Mildly enlarged cardiac silhouette with prominent pulmonary vasculature.  Mildly prominent interstitial markings.  The patient's chin is overlying the upper right lung apex.  Thoracic spine degenerative changes.  Diffuse osteopenia.  IMPRESSION:  1.  Pulmonary vascular congestion and mild cardiomegaly. 2.  Mild chronic interstitial lung disease.   Original Report Authenticated By: Claudie Revering, M.D.   Dg Hip Complete Right  07/17/2012   *RADIOLOGY REPORT*  Clinical Data: Right hip pain following a fall.  RIGHT HIP - COMPLETE 2+ VIEW  Comparison: None.  Findings: Right upper intertrochanteric fracture with mild impaction and  mild varus angulation.  Right femoral head and neck junction spur formation.  Diffuse osteopenia.  Right pelvic surgical clips.  IMPRESSION:  1.  Right intertrochanteric fracture, as described above. 2.  Mild to moderate right hip degenerative changes.   Original Report Authenticated By: Claudie Revering, M.D.   US Renal  07/18/2012   *RADIOLOGY REPORT*  Clinical Data: Renal failure, evaluate for hydronephrosis  RENAL/URINARY TRACT ULTRASOUND COMPLETE  Comparison:  None.  Findings:  Right Kidney:  Normal cortical thickness and size, measuring 10.3 cm in length. There is mild diffuse increase echogenicity of the renal parenchymal echotexture.  No focal renal lesions.  No echogenic renal stones.  No urinary obstruction.  Left Kidney:  Normal cortical thickness and size, measuring 8.5 cm in length. There is mild diffuse increased echogenicity of the renal parenchymal echotexture.  No focal renal lesions.  No echogenic renal stones.  No urinary obstruction.  Bladder:  Decompressed with a Foley catheter.  IMPRESSION: Mildly increased echogenicity of the renal parenchymal echotexture suggestive of medical renal disease.  No discrete renal lesions or evidence of urinary obstruction.   Original Report Authenticated By: Jake Seats, MD   US Carotid Duplex Bilateral  07/18/2012   *RADIOLOGY REPORT*  Clinical Data: Stroke, hypertension, history of diabetes  BILATERAL CAROTID DUPLEX ULTRASOUND  Technique: Gray scale imaging, color Doppler and duplex ultrasound  was performed of bilateral carotid and vertebral arteries in the neck.  Comparison:  None.  Criteria:  Quantification of carotid stenosis is based on velocity parameters that correlate the residual internal carotid diameter with NASCET-based stenosis levels, using the diameter of the distal internal carotid lumen as the denominator for stenosis measurement.  The following velocity measurements were obtained:                   PEAK SYSTOLIC/END DIASTOLIC RIGHT ICA:                         76/17cm/sec CCA:                        [id number] SYSTOLIC ICA/CCA RATIO:     [id number] DIASTOLIC ICA/CCA RATIO:    1.91 ECA:                        119cm/sec  LEFT ICA:                        80/21cm/sec CCA:                        [id number] SYSTOLIC ICA/CCA RATIO:     [id number] DIASTOLIC ICA/CCA RATIO:    1.9 ECA:                        93cm/sec  Findings:  RIGHT CAROTID ARTERY: There is a very minimal amount of intimal Erber thickening within the right carotid bulb (image 17), not resulting elevated peak systolic velocities within the right internal carotid artery to suggest a hemodynamically significant stenosis.  RIGHT VERTEBRAL ARTERY:  Antegrade flow  LEFT CAROTID ARTERY: There is a minimal amount of intimal Meints thickening within the left carotid bulb (images 49 and 51), not resulting in elevated peak systolic velocities within the left internal carotid artery to suggest a hemodynamically significant stenosis.  LEFT VERTEBRAL ARTERY:  Antegrade flow  Incidental note is made of a sub centimeter (approximately 4 mm) anechoic nodule within the right lobe of the thyroid (image 2).  IMPRESSION:  1.  Minimal amount of bilateral intimal Sitter thickening, not resulting in hemodynamically significant stenosis. 2.  Incidental note made of a sub centimeter anechoic nodule within the right lobe of the thyroid.  Further evaluation with dedicated thyroid ultrasound may be obtained as clinically indicated.   Original Report Authenticated By: Jake Seats, MD      Medications: I have reviewed the patient's current medications.  Impression: 1. Syncope, serial cardiac enzymes negative . Ultrasound of the carotids did not show any significant stenosis. Renal ultrasound consistent with medical renal disease and chronic kidney disease. Echocardiogram has been done, result is pending. 2. Possible UTI on urinalysis. Urine culture pending. 3. Closed right hip fracture, awaiting orthopedic consultation. 4.  Hypertension. 5. Type 2 diabetes mellitus. 6. History of cerebrovascular disease. No new CVA clinically.  7. Renal insufficiency, acute versus chronic. 8. Anemia, acute blood loss anemia versus anemia from chronic kidney disease, status post blood transfusion.     Plan: 1. Patient is medically cleared for surgery tomorrow. Consultants:  Orthopedics consulted.   Procedures:  None.   Antibiotics:  Ceftriaxone IV started 07/18/2012.                   Code Status: Full  code.  Family Communication: Discussed plan with patient at the bedside.   Disposition Plan: Depending on progress.  Time spent: 20 minutes .   LOS: 2 days   Doree Albee Pager [phone]  07/19/2012, 10:16 AM

## 2012-07-19 NOTE — Clinical Social Work Psychosocial (Signed)
Clinical Social Work Department BRIEF PSYCHOSOCIAL ASSESSMENT 07/19/2012  Patient:  Courtney [name], Courtney [name]     Account Number:  [id number]     Admit date:  07/17/2012  Clinical Social Worker:  Wyatt Haste  Date/Time:  07/19/2012 03:10 PM  Referred by:  CSW  Date Referred:  07/19/2012 Referred for  SNF Placement   Other Referral:   Interview type:  Patient Other interview type:    PSYCHOSOCIAL DATA Living Status:  FAMILY Admitted from facility:   Level of care:   Primary support name:  [name] Primary support relationship to patient:  CHILD, ADULT Degree of support available:   supportive    CURRENT CONCERNS Current Concerns  Post-Acute Placement   Other Concerns:    SOCIAL WORK ASSESSMENT / PLAN CSW met with pt at bedside. Pt alert and oriented and reports she lives with her son who she describes as her best support. Pt admitted due to hip fracture. She reports she was walking to the bathroom at home when she lost consciousness. Her son was home and called pt's niece who helped bring her to the ED. Pt said her son is there most of the time and tries to make sure pt does not have to get up for anything if he leaves for a short while. Pt indicates she is fairly independent in care and her son takes care of all the housework. She uses a cane or walker inside the home and has a wheelchair for when she goes out. CSW discussed SNF with pt and that she will likely require rehab ST. She is open to this if needed and requests [facility] if possible as she lives in [place]. Aware of insurance authorizzation process. SNF list provided. Pt will discuss with her son. Surgery scheduled for tomorrow.   Assessment/plan status:  Psychosocial Support/Ongoing Assessment of Needs Other assessment/ plan:   Information/referral to community resources:   SNF list    PATIENT'S/FAMILY'S RESPONSE TO PLAN OF CARE: Pt open to SNF if needed. CSW will follow up after PT evaluation. Pt reports no further needs at this  time.       Courtney [name], [place]

## 2012-07-19 NOTE — Progress Notes (Signed)
*  PRELIMINARY RESULTS* Echocardiogram 2D Echocardiogram has been performed.  Tera Partridge 07/19/2012, 10:21 AM

## 2012-07-19 NOTE — Progress Notes (Signed)
Subjective:   Recent Labs  07/17/12 1550 07/18/12 0633 07/19/12 0619  HGB 10.3* 9.1* 12.0    Recent Labs  07/18/12 0633 07/19/12 0619  WBC 5.1 6.4  RBC 3.15* 4.20  HCT 26.5* 35.8*  PLT 178 170    Recent Labs  07/18/12 0633 07/19/12 0619  NA 139 141  K 3.6 4.9  CL 106 111  CO2 24 23  BUN 17 16  CREATININE 1.73* 1.78*  GLUCOSE 106* 109*  CALCIUM 8.1* 8.4    Recent Labs  07/17/12 1550  INR 1.03   Assessment/Plan: Surgery tomorrow if cleared by Cardiology/echo completed etc  Hg good, potassium good    Arther Abbott 07/19/2012, 8:17 AM

## 2012-07-19 NOTE — Care Management Note (Signed)
    Page 1 of 1   07/22/2012     3:24:21 PM   CARE MANAGEMENT NOTE 07/22/2012  Patient:  Courtney [name], Courtney [name]   Account Number:  [id number]  Date Initiated:  07/19/2012  Documentation initiated by:  Theophilus Kinds  Subjective/Objective Assessment:   Pt admitted from home s/p syncopal episode and fall with fractured hip. Pt lives with her son and has a daughter who is active in the care of pt. Pt is requiring surgery. Pt has walker, cane, shower chair, w/c for home use.     Action/Plan:   PT is aware that she may need rehab after surgery and is agreeable. Will continue to follow in case pt does discharge home.   Anticipated DC Date:  07/25/2012   Anticipated DC Plan:  SKILLED NURSING FACILITY  In-house referral  Clinical Social Worker      DC Planning Services  CM consult      Choice offered to / List presented to:             Status of service:  Completed, signed off Medicare Important Message given?  YES (If response is "NO", the following Medicare IM given date fields will be blank) Date Medicare IM given:  07/22/2012 Date Additional Medicare IM given:    Discharge Disposition:  [place]  Per UR Regulation:    If discussed at Long Length of Stay Meetings, dates discussed:    Comments:  07/22/12 [place], RN BSN CM Pt discharged to [facility] today. CSW to arrange discharge to facility.  07/19/12 [place], RN BSN CM

## 2012-07-19 NOTE — Progress Notes (Signed)
UR chart review completed.  

## 2012-07-20 ENCOUNTER — Encounter (HOSPITAL_COMMUNITY): Payer: Self-pay | Admitting: *Deleted

## 2012-07-20 ENCOUNTER — Inpatient Hospital Stay (HOSPITAL_COMMUNITY): Payer: Medicare Other | Admitting: Anesthesiology

## 2012-07-20 ENCOUNTER — Encounter (HOSPITAL_COMMUNITY)
Admission: EM | Disposition: A | Discharge status: Skilled Nursing Facility | Payer: Self-pay | Source: Home / Self Care | Attending: Internal Medicine

## 2012-07-20 ENCOUNTER — Inpatient Hospital Stay (HOSPITAL_COMMUNITY): Payer: Medicare Other

## 2012-07-20 ENCOUNTER — Encounter (HOSPITAL_COMMUNITY): Payer: Self-pay | Admitting: Anesthesiology

## 2012-07-20 DIAGNOSIS — N390 Urinary tract infection, site not specified: Secondary | ICD-10-CM

## 2012-07-20 HISTORY — PX: INTRAMEDULLARY (IM) NAIL INTERTROCHANTERIC: SHX5875

## 2012-07-20 LAB — GLUCOSE, CAPILLARY
Glucose-Capillary: 134 mg/dL — ABNORMAL HIGH (ref 70–99)
Glucose-Capillary: 105 mg/dL — ABNORMAL HIGH (ref 70–99)
Glucose-Capillary: 108 mg/dL — ABNORMAL HIGH (ref 70–99)
Glucose-Capillary: 148 mg/dL — ABNORMAL HIGH (ref 70–99)

## 2012-07-20 LAB — URINE CULTURE: Colony Count: >=100000

## 2012-07-20 LAB — SURGICAL PCR SCREEN: MRSA, PCR: NEGATIVE

## 2012-07-20 SURGERY — FIXATION, FRACTURE, INTERTROCHANTERIC, WITH INTRAMEDULLARY ROD
Anesthesia: Spinal | Site: Hip | Laterality: Right | Wound class: Clean

## 2012-07-20 MED ORDER — MIDAZOLAM HCL 2 MG/2ML IJ SOLN
1.0000 mg | INTRAMUSCULAR | Status: DC | PRN
  Administered 2012-07-20: 2 mg via INTRAVENOUS

## 2012-07-20 MED ORDER — BUPIVACAINE IN DEXTROSE 0.75-8.25 % IT SOLN
INTRATHECAL | Status: AC
  Filled 2012-07-20: qty 2

## 2012-07-20 MED ORDER — LACTATED RINGERS IV SOLN
INTRAVENOUS | Status: DC
  Administered 2012-07-20: 1000 mL via INTRAVENOUS

## 2012-07-20 MED ORDER — MENTHOL 3 MG MT LOZG: 1.0000 | LOZENGE | OROMUCOSAL | Status: DC | PRN

## 2012-07-20 MED ORDER — ALUM & MAG HYDROXIDE-SIMETH 200-200-20 MG/5ML PO SUSP: 30.0000 mL | ORAL | Status: DC | PRN

## 2012-07-20 MED ORDER — ONDANSETRON HCL 4 MG/2ML IJ SOLN
4.0000 mg | Freq: Once | INTRAMUSCULAR | Status: AC | PRN
  Administered 2012-07-20: 4 mg via INTRAVENOUS

## 2012-07-20 MED ORDER — MIDAZOLAM HCL 2 MG/2ML IJ SOLN
INTRAMUSCULAR | Status: AC
  Filled 2012-07-20: qty 2

## 2012-07-20 MED ORDER — PHENOL 1.4 % MT LIQD: 1.0000 | OROMUCOSAL | Status: DC | PRN

## 2012-07-20 MED ORDER — METOCLOPRAMIDE HCL 5 MG/ML IJ SOLN: 5.0000 mg | Freq: Three times a day (TID) | INTRAMUSCULAR | Status: DC | PRN

## 2012-07-20 MED ORDER — TRAMADOL HCL 50 MG PO TABS
50.0000 mg | ORAL_TABLET | Freq: Four times a day (QID) | ORAL | Status: DC
  Administered 2012-07-20 – 2012-07-21 (×2): 50 mg via ORAL
  Filled 2012-07-20 (×2): qty 1

## 2012-07-20 MED ORDER — ONDANSETRON HCL 4 MG/2ML IJ SOLN: 4.0000 mg | Freq: Four times a day (QID) | INTRAMUSCULAR | Status: DC | PRN

## 2012-07-20 MED ORDER — TRAMADOL HCL 50 MG PO TABS
50.0000 mg | ORAL_TABLET | Freq: Once | ORAL | Status: AC
  Administered 2012-07-20: 50 mg via ORAL

## 2012-07-20 MED ORDER — ASPIRIN EC 325 MG PO TBEC
325.0000 mg | DELAYED_RELEASE_TABLET | Freq: Every day | ORAL | Status: DC
  Administered 2012-07-21 – 2012-07-22 (×2): 325 mg via ORAL
  Filled 2012-07-20 (×2): qty 1

## 2012-07-20 MED ORDER — MORPHINE SULFATE 2 MG/ML IJ SOLN: 0.5000 mg | INTRAMUSCULAR | Status: DC | PRN

## 2012-07-20 MED ORDER — ACETAMINOPHEN 10 MG/ML IV SOLN
500.0000 mg | Freq: Four times a day (QID) | INTRAVENOUS | Status: DC
  Administered 2012-07-20 – 2012-07-21 (×2): 500 mg via INTRAVENOUS
  Filled 2012-07-20 (×3): qty 50

## 2012-07-20 MED ORDER — TRAMADOL HCL 50 MG PO TABS
ORAL_TABLET | ORAL | Status: AC
  Filled 2012-07-20: qty 1

## 2012-07-20 MED ORDER — ACETAMINOPHEN 10 MG/ML IV SOLN
500.0000 mg | Freq: Once | INTRAVENOUS | Status: DC
  Filled 2012-07-20: qty 100

## 2012-07-20 MED ORDER — ACETAMINOPHEN 10 MG/ML IV SOLN
500.0000 mg | Freq: Once | INTRAVENOUS | Status: AC
  Administered 2012-07-20: 500 mg via INTRAVENOUS
  Filled 2012-07-20: qty 50

## 2012-07-20 MED ORDER — FENTANYL CITRATE 0.05 MG/ML IJ SOLN
INTRAMUSCULAR | Status: AC
  Filled 2012-07-20: qty 2

## 2012-07-20 MED ORDER — MIDAZOLAM HCL 5 MG/5ML IJ SOLN
INTRAMUSCULAR | Status: DC | PRN
  Administered 2012-07-20 (×2): 1 mg via INTRAVENOUS

## 2012-07-20 MED ORDER — DEXTROSE 50 % IV SOLN
INTRAVENOUS | Status: AC
  Filled 2012-07-20: qty 50

## 2012-07-20 MED ORDER — ONDANSETRON HCL 4 MG PO TABS: 4.0000 mg | ORAL_TABLET | Freq: Four times a day (QID) | ORAL | Status: DC | PRN

## 2012-07-20 MED ORDER — ACETAMINOPHEN 10 MG/ML IV SOLN
INTRAVENOUS | Status: AC
  Filled 2012-07-20: qty 100

## 2012-07-20 MED ORDER — ACETAMINOPHEN 10 MG/ML IV SOLN: 1000.0000 mg | Freq: Once | INTRAVENOUS | Status: DC

## 2012-07-20 MED ORDER — PROPOFOL 10 MG/ML IV EMUL
INTRAVENOUS | Status: AC
  Filled 2012-07-20: qty 20

## 2012-07-20 MED ORDER — DEXTROSE 50 % IV SOLN
25.0000 mL | Freq: Once | INTRAVENOUS | Status: AC | PRN
  Administered 2012-07-20: 25 mL via INTRAVENOUS

## 2012-07-20 MED ORDER — PROPOFOL INFUSION 10 MG/ML OPTIME
INTRAVENOUS | Status: DC | PRN
  Administered 2012-07-20: 40 ug/kg/min via INTRAVENOUS

## 2012-07-20 MED ORDER — FENTANYL CITRATE 0.05 MG/ML IJ SOLN: 25.0000 ug | INTRAMUSCULAR | Status: DC | PRN

## 2012-07-20 MED ORDER — DEXTROSE 50 % IV SOLN
12.5000 g | Freq: Once | INTRAVENOUS | Status: AC
  Administered 2012-07-20: 12.5 g via INTRAVENOUS

## 2012-07-20 MED ORDER — ONDANSETRON HCL 4 MG/2ML IJ SOLN
INTRAMUSCULAR | Status: AC
  Filled 2012-07-20: qty 2

## 2012-07-20 MED ORDER — CHLORHEXIDINE GLUCONATE 4 % EX LIQD
60.0000 mL | Freq: Once | CUTANEOUS | Status: AC
  Administered 2012-07-20: 4 via TOPICAL
  Filled 2012-07-20: qty 15

## 2012-07-20 MED ORDER — BUPIVACAINE-EPINEPHRINE PF 0.5-1:200000 % IJ SOLN
INTRAMUSCULAR | Status: DC | PRN
  Administered 2012-07-20: 60 mL

## 2012-07-20 MED ORDER — METOCLOPRAMIDE HCL 10 MG PO TABS: 5.0000 mg | ORAL_TABLET | Freq: Three times a day (TID) | ORAL | Status: DC | PRN

## 2012-07-20 MED ORDER — CEFAZOLIN SODIUM-DEXTROSE 2-3 GM-% IV SOLR
2.0000 g | Freq: Four times a day (QID) | INTRAVENOUS | Status: AC
  Administered 2012-07-20 (×2): 2 g via INTRAVENOUS
  Filled 2012-07-20 (×2): qty 50

## 2012-07-20 MED ORDER — BUPIVACAINE-EPINEPHRINE PF 0.5-1:200000 % IJ SOLN
INTRAMUSCULAR | Status: AC
  Filled 2012-07-20: qty 10

## 2012-07-20 MED ORDER — FENTANYL CITRATE 0.05 MG/ML IJ SOLN
INTRAMUSCULAR | Status: DC | PRN
  Administered 2012-07-20: 25 ug via INTRATHECAL

## 2012-07-20 MED ORDER — SENNOSIDES-DOCUSATE SODIUM 8.6-50 MG PO TABS: 1.0000 | ORAL_TABLET | Freq: Every evening | ORAL | Status: DC | PRN

## 2012-07-20 MED ORDER — BUPIVACAINE IN DEXTROSE 0.75-8.25 % IT SOLN
INTRATHECAL | Status: DC | PRN
  Administered 2012-07-20: 15 mg via INTRATHECAL

## 2012-07-20 MED ORDER — EPHEDRINE SULFATE 50 MG/ML IJ SOLN
INTRAMUSCULAR | Status: AC
  Filled 2012-07-20: qty 1

## 2012-07-20 MED ORDER — FENTANYL CITRATE 0.05 MG/ML IJ SOLN
INTRAMUSCULAR | Status: DC | PRN
  Administered 2012-07-20 (×3): 25 ug via INTRAVENOUS

## 2012-07-20 MED ORDER — SODIUM CHLORIDE 0.9 % IR SOLN
Status: DC | PRN
  Administered 2012-07-20: 1000 mL

## 2012-07-20 SURGICAL SUPPLY — 49 items
BAG HAMPER (MISCELLANEOUS) ×2 IMPLANT
BANDAGE GAUZE ELAST BULKY 4 IN (GAUZE/BANDAGES/DRESSINGS) ×2 IMPLANT
BIT DRILL AO GAMMA 4.2X300 (BIT) ×2 IMPLANT
BLADE SURG SZ10 CARB STEEL (BLADE) ×4 IMPLANT
CHLORAPREP W/TINT 26ML (MISCELLANEOUS) ×2 IMPLANT
CLOTH BEACON ORANGE TIMEOUT ST (SAFETY) ×2 IMPLANT
COVER LIGHT HANDLE STERIS (MISCELLANEOUS) ×4 IMPLANT
COVER MAYO STAND XLG (DRAPE) ×2 IMPLANT
DECANTER SPIKE VIAL GLASS SM (MISCELLANEOUS) ×4 IMPLANT
DRAPE STERI IOBAN 125X83 (DRAPES) ×2 IMPLANT
DRSG MEPILEX BORDER 4X12 (GAUZE/BANDAGES/DRESSINGS) ×2 IMPLANT
ELECT REM PT RETURN 9FT ADLT (ELECTROSURGICAL) ×2
ELECTRODE REM PT RTRN 9FT ADLT (ELECTROSURGICAL) ×1 IMPLANT
GLOVE BIOGEL PI IND STRL 7.0 (GLOVE) ×2 IMPLANT
GLOVE BIOGEL PI INDICATOR 7.0 (GLOVE) ×2
GLOVE ECLIPSE 6.5 STRL STRAW (GLOVE) ×4 IMPLANT
GLOVE ECLIPSE 7.0 STRL STRAW (GLOVE) ×2 IMPLANT
GLOVE SKINSENSE NS SZ8.0 LF (GLOVE) ×1
GLOVE SKINSENSE STRL SZ8.0 LF (GLOVE) ×1 IMPLANT
GLOVE SS N UNI LF 8.5 STRL (GLOVE) ×2 IMPLANT
GOWN STRL REIN XL XLG (GOWN DISPOSABLE) ×6 IMPLANT
GUIDEROD T2 3X1000 (ROD) ×2 IMPLANT
INST SET MAJOR BONE (KITS) ×2 IMPLANT
K-WIRE  3.2X450M STR (WIRE) ×1
K-WIRE 3.2X450M STR (WIRE) ×1
KIT BLADEGUARD II DBL (SET/KITS/TRAYS/PACK) ×2 IMPLANT
KIT ROOM TURNOVER AP CYSTO (KITS) ×2 IMPLANT
KWIRE 3.2X450M STR (WIRE) ×1 IMPLANT
MANIFOLD NEPTUNE II (INSTRUMENTS) ×2 IMPLANT
MARKER SKIN DUAL TIP RULER LAB (MISCELLANEOUS) ×2 IMPLANT
NAIL TROCH GAMMA 11X18 (Nail) ×2 IMPLANT
NEEDLE HYPO 21X1.5 SAFETY (NEEDLE) ×2 IMPLANT
NEEDLE SPNL 18GX3.5 QUINCKE PK (NEEDLE) ×2 IMPLANT
NS IRRIG 1000ML POUR BTL (IV SOLUTION) ×2 IMPLANT
PACK BASIC III (CUSTOM PROCEDURE TRAY) ×1
PACK SRG BSC III STRL LF ECLPS (CUSTOM PROCEDURE TRAY) ×1 IMPLANT
PENCIL HANDSWITCHING (ELECTRODE) ×2 IMPLANT
SCREW LAG GAMMA 3 TI 10.5X85MM (Screw) ×2 IMPLANT
SCREW LOCKING T2 F/T  5MMX30MM (Screw) ×1 IMPLANT
SCREW LOCKING T2 F/T 5MMX30MM (Screw) ×1 IMPLANT
SET BASIN LINEN APH (SET/KITS/TRAYS/PACK) ×2 IMPLANT
SPONGE LAP 18X18 X RAY DECT (DISPOSABLE) ×4 IMPLANT
STAPLER VISISTAT 35W (STAPLE) ×2 IMPLANT
SUT MNCRL 0 VIOLET CTX 36 (SUTURE) ×2 IMPLANT
SUT MON AB 2-0 CT1 36 (SUTURE) ×2 IMPLANT
SUT MONOCRYL 0 CTX 36 (SUTURE) ×2
SYR 30ML LL (SYRINGE) ×2 IMPLANT
SYR BULB IRRIGATION 50ML (SYRINGE) ×4 IMPLANT
YANKAUER SUCT 12FT TUBE ARGYLE (SUCTIONS) ×2 IMPLANT

## 2012-07-20 NOTE — Op Note (Signed)
Preop diagnosis right hip intertrochanteric fracture closed Postoperative diagnosis same Procedure intramedullary nail right hip fracture with gamma nail Surgeon Aline Brochure Spinal anesthetic Two-part fracture nondisplaced Assisted by Molly Maduro EBL 100 cc Implant gamma nail 125 short nail with an 85 mm lag screw, acorn proximally in sliding mode, distal screw 30 mm   After site marking and chart update the patient was taken to the operating room where she received spinal anesthesia. The patient was placed on the fracture table with a peroneal post. The left leg was placed in abduction the right leg was placed in a padded traction device.  The C-arm was brought in and the limb was manipulated with traction and slight internal rotation which gave an excellent stable reduction. The RIGHT  leg was then prepped and draped sterilely. After completing the timeout a lateral incision was made over the greater trochanter extended proximally. Subcutaneous tissues were divided and the subcutaneous hemorrhage was controlled with electrocautery. Further dissection was carried out to the fascia which was split in line with the skin incision and then blunt dissection was carried out in the abductor musculature into the greater trochanter was palpated. A curved awl was passed into the greater trochanter into the femoral canal. This was confirmed by x-ray. A guidewire was placed into the cannulated awl and passed to the knee and confirmed by x-ray. Using manufacturer's recommended technique reaming was performed over the guidewire to the level of the lesser trochanter. 125 nail was passed over the guidewire  A lateral incision was made in preparation for the lag screw. Using the cannula and perforating drill the guidewire was placed across the fracture site into the femoral head. X-rays confirmed position. The pin was then measured. A 85 mm setting was placed on the reamer and the reamer was passed over the  guidewire under C-arm guidance. The 85 lag screw was placed. The acorn was then placed proximally. The acorn was placed in a dynamic mode. The acorn was confirmed to be engaged per Programmer, applications.  Traction was then released.  We then made a second stab wound laterally past the locking bolt cannula , this was followed by making a drill hole for the screw which was measured from the drill bit. A 30 mm screw was passed and confirmed to be in the nail on AP and lateral x-ray   Wounds were then irrigated radiographs confirmed position of the fracture and of the implant.  Layered closure was performed proximally with 0 Monocryl and 2-0 Monocryl followed by reapproximation of skin edges with staples  The 2 distal incisions were closed with staples.  A total of 60 cc of Marcaine was injected into the wound area with 30 cc injected beneath the fascial layer and 30 cc in the subcutaneous layer.  Sterile dressing was applied  The patient was taken to recovery room in stable condition  Postop plan is for full weightbearing.

## 2012-07-20 NOTE — Progress Notes (Signed)
  Courtney [name] [id number] DOB: [birth year] DOA: 07/17/2012 PCP: Neale Burly, MD   Subjective: This lady has had no further syncopal episodes or loss of consciousness. She has pain in the right hip from her fracture. She is due to have surgery today. All investigations for syncope are essentially negative/normal.           Physical Exam: Blood pressure 137/68, pulse 63, temperature 100.5 F (38.1 C), temperature source Oral, resp. rate 18, height 5\' 2"  (1.575 m), weight 45.36 kg (100 lb), SpO2 94.00%. She looks systemically well. Heart sounds are present and normal. Lung fields are clear. She is alert and oriented. The right leg is shortened and externally rotated, consistent with a right hip fracture.        Basic Metabolic Panel:  Recent Labs  07/18/12 0633 07/19/12 0619  NA 139 141  K 3.6 4.9  CL 106 111  CO2 24 23  GLUCOSE 106* 109*  BUN 17 16  CREATININE 1.73* 1.78*  CALCIUM 8.1* 8.4   Liver Function Tests:  Recent Labs  07/17/12 1550 07/18/12 0633  AST 22 17  ALT 18 14  ALKPHOS 111 94  BILITOT 0.6 0.6  PROT 7.2 5.8*  ALBUMIN 3.4* 2.9*     CBC:  Recent Labs  07/18/12 0633 07/19/12 0619  WBC 5.1 6.4  HGB 9.1* 12.0  HCT 26.5* 35.8*  MCV 84.1 85.2  PLT 178 170    No results found.    Medications: I have reviewed the patient's current medications.  Impression: 1. Syncope, serial cardiac enzymes negative . Ultrasound carotids, echocardiogram unremarkable. 2. UTI, gram-negative rods isolated. Await full identification. 3. Closed right hip fracture, surgery today. 4. Hypertension. 5. Type 2 diabetes mellitus. 6. History of cerebrovascular disease. No new CVA clinically.  7. Chronic kidney disease. Baseline creatinine appears to be 1.7-1.8.. 8. Anemia, acute blood loss anemia versus anemia from chronic kidney disease, status post blood transfusion.     Plan: 1. Patient is medically cleared for surgery  today. Consultants:  Orthopedics consulted.   Procedures:  None.   Antibiotics:  Ceftriaxone IV started 07/18/2012.                   Code Status: Full code.  Family Communication: Discussed plan with patient at the bedside.   Disposition Plan: Depending on progress.  Time spent: 20 minutes .   LOS: 3 days   Doree Albee Pager [phone]  07/20/2012, 10:14 AM

## 2012-07-20 NOTE — Transfer of Care (Signed)
Immediate Anesthesia Transfer of Care Note  Patient: Courtney [name]  Procedure(s) Performed: Procedure(s): INTRAMEDULLARY (IM) NAIL INTERTROCHANTRIC (Right)  Patient Location: PACU  Anesthesia Type:Spinal  Level of Consciousness: awake and patient cooperative  Airway & Oxygen Therapy: Patient Spontanous Breathing and Patient connected to face mask oxygen  Post-op Assessment: Report given to PACU RN and Post -op Vital signs reviewed and stable  Post vital signs: Reviewed and stable  Complications: No apparent anesthesia complications

## 2012-07-20 NOTE — Brief Op Note (Signed)
07/17/2012 - 07/20/2012  3:00 PM  PATIENT:  Courtney [name]  72 y.o. female  PRE-OPERATIVE DIAGNOSIS:  intramedullary im nail intertrochanteric right hip  POST-OPERATIVE DIAGNOSIS:  intramedullary im nail intertrochanteric right hip  125 degree short gamma nail with 85 mm lag screw and 30 mm distal locking bolt and acorn proximal screw in dynamic mode   Findings 2 part intertrochanteric fracture right hip  No displacement  PROCEDURE:  Procedure(s): INTRAMEDULLARY (IM) NAIL INTERTROCHANTRIC (Right)  SURGEON:  Surgeon(s) and Role:    * Carole Civil, MD - Primary  PHYSICIAN ASSISTANT:   ASSISTANTS: catherine page    ANESTHESIA:   spinal  EBL:  Total I/O In: [id number] [I.V.:4545] Out: 850 [Urine:750; Blood:100]  BLOOD ADMINISTERED:none  DRAINS: none   LOCAL MEDICATIONS USED:  MARCAINE    and Amount: 60 ml  SPECIMEN:  No Specimen  DISPOSITION OF SPECIMEN:  N/A  COUNTS:  YES  TOURNIQUET:  * No tourniquets in log *  DICTATION: .Dragon Dictation  PLAN OF CARE: Admit to inpatient   PATIENT DISPOSITION:  PACU - hemodynamically stable.   Delay start of Pharmacological VTE agent (>24hrs) due to surgical blood loss or risk of bleeding: yes

## 2012-07-20 NOTE — Clinical Documentation Improvement (Signed)
CKD DOCUMENTATION CLARIFICATION QUERY   THIS DOCUMENT IS NOT A PERMANENT PART OF THE MEDICAL RECORD  TO RESPOND TO THE THIS QUERY, FOLLOW THE INSTRUCTIONS BELOW:  1. If needed, update documentation for the patient's encounter via the notes activity.  2. Access this query again and click edit on the In [place].  3. After updating, or not, click F2 to complete all highlighted (required) fields concerning your review. Select "additional documentation in the medical record" OR "no additional documentation provided".  4. Click Sign note button.  5. The deficiency will fall out of your In Basket *Please let us know if you are not able to complete this workflow by phone or e-mail (listed below).  Please update your documentation within the medical record to reflect your response to this query.                                                                                        07/20/12   Dear Dr.Gosrani/Associates,  In a better effort to capture your patient's severity of illness, reflect appropriate length of stay and utilization of resources, a review of the patient medical record has revealed the following indicators.   Based on your clinical judgment, please clarify and document in a progress note and/or discharge summary the clinical condition associated with the following supporting information: In responding to this query please exercise your independent judgment.  The fact that a query is asked, does not imply that any particular answer is desired or expected.  Please clarify stage of CKD if known. Thank you.  Possible Clinical Conditions?   CKD Stage I -  GFR > OR = 90 CKD Stage II - GFR 60-80 CKD Stage III - GFR 30-59 CKD Stage IV - GFR 15-29 CKD Stage V - GFR < 15 ESRD (End Stage Renal Disease) Other condition_____________ Cannot Clinically determine   Supporting Information:  Risk Factors: History of hypertension & diabetes Per H/P:  "Renal insufficiency,? Acute  or chronic." Per 5/26 & 5/27 progress notes: " Renal insufficiency, acute versus chronic." Per 5/28 progress note: "Chronic kidney disease. Baseline creatinine appears to be 1.7-1.8.".   Diagnostics:  BUN/CR/GFR 5/25 = 20/1.75/33 5/26 = 17/1.73/33 5/27 = 16/1.78/32  Treatment: Monitoring BMP Monitor I&O qshift  You may use possible, probable, or suspect with inpatient documentation. possible, probable, suspected diagnoses MUST be documented at the time of discharge  Reviewed:  no additional documentation provided   Thank You,  Estella Husk  RN, MSN Clinical Documentation Specialist: Office# [phone]  [place]

## 2012-07-20 NOTE — Progress Notes (Signed)
Hypoglycemic Event  CBG: 69  Treatment: D50 IV 25 mL  Symptoms: None  Follow-up CBG: [id number] CBG Result:134  Possible Reasons for Event: Unknown pt NPO after midnight for surgery last night  Comments/MD notified:Dr. Anastasio Champion notified at [phone], after treatment received and CBG rechecked.     Marry Guan  Remember to initiate Hypoglycemia Order Set & complete

## 2012-07-20 NOTE — Anesthesia Procedure Notes (Signed)
Spinal  Patient location during procedure: OR Start time: 07/20/2012 1:28 PM Staffing CRNA/Resident: Javayah Magaw, [place] Checklist Completed: patient identified, site marked, surgical consent, pre-op evaluation, timeout performed, IV checked, risks and benefits discussed and monitors and equipment checked Spinal Block Patient position: right lateral decubitus Prep: Betadine Patient monitoring: heart rate, cardiac monitor, continuous pulse ox and blood pressure Approach: midline Location: L3-4 Injection technique: single-shot Needle Needle type: Spinocan  Needle gauge: 22 G Assessment Sensory level: T6 Additional Notes CSF brisk and clear      [id number]      01/2013

## 2012-07-20 NOTE — Clinical Social Work Placement (Signed)
Clinical Social Work Department CLINICAL SOCIAL WORK PLACEMENT NOTE 07/20/2012  Patient:  Courtney [name], Courtney [name]  Account Number:  [id number] Admit date:  07/17/2012  Clinical Social Worker:  Benay Pike, LCSW  Date/time:  07/19/2012 03:05 PM  Clinical Social Work is seeking post-discharge placement for this patient at the following level of care:   SKILLED NURSING   (*CSW will update this form in Epic as items are completed)   07/19/2012  Patient/family provided with [place] Department of Clinical Social Work's list of facilities offering this level of care within the geographic area requested by the patient (or if unable, by the patient's family).  07/19/2012  Patient/family informed of their freedom to choose among providers that offer the needed level of care, that participate in Medicare, Medicaid or managed care program needed by the patient, have an available bed and are willing to accept the patient.  07/19/2012  Patient/family informed of [facility]' ownership interest in [facility], as well as of the fact that they are under no obligation to receive care at this facility.  PASARR submitted to EDS on 07/19/2012 PASARR number received from EDS on 07/19/2012  FL2 transmitted to all facilities in geographic area requested by pt/family on  07/19/2012 FL2 transmitted to all facilities within larger geographic area on   Patient informed that his/her managed care company has contracts with or will negotiate with  certain facilities, including the following:     Patient/family informed of bed offers received:  07/20/2012 Patient chooses bed at [facility] Physician recommends and patient chooses bed at  [facility]  Patient to be transferred to  on   Patient to be transferred to facility by   The following physician request were entered in Epic:   Additional Comments:  Benay Pike, [place]

## 2012-07-20 NOTE — Clinical Social Work Note (Signed)
Pt scheduled for surgery this afternoon. CSW presented bed offers and pt chooses [facility]. Facility notified. CSW will follow up after PT eval to work with Lovie Macadamia on authorization.  Benay Pike, [place]

## 2012-07-20 NOTE — Anesthesia Postprocedure Evaluation (Signed)
  Anesthesia Post-op Note  Patient: Courtney [name]  Procedure(s) Performed: Procedure(s): INTRAMEDULLARY (IM) NAIL INTERTROCHANTRIC (Right)  Patient Location: PACU  Anesthesia Type:Spinal  Level of Consciousness: awake, alert , oriented and patient cooperative  Airway and Oxygen Therapy: Patient Spontanous Breathing  Post-op Pain: 4 /10, mild  Post-op Assessment: Post-op Vital signs reviewed, Patient's Cardiovascular Status Stable, Respiratory Function Stable, Patent Airway, No signs of Nausea or vomiting and Pain level controlled  Post-op Vital Signs: Reviewed and stable  Complications: No apparent anesthesia complications

## 2012-07-20 NOTE — Anesthesia Preprocedure Evaluation (Addendum)
Anesthesia Evaluation  Patient identified by MRN, date of birth, ID band Patient awake    Reviewed: Allergy & Precautions, H&P , NPO status , Patient's Chart, lab work & pertinent test results  Airway Mallampati: II TM Distance: >3 FB Neck ROM: Full    Dental  (+) Edentulous Upper and Edentulous Lower   Pulmonary neg pulmonary ROS,  breath sounds clear to auscultation        Cardiovascular hypertension, Pt. on medications Rhythm:Regular Rate:Normal  Syncopal episode - fell - Fx'd R hip. No more syncope since admitted.   Neuro/Psych    GI/Hepatic   Endo/Other  diabetes, Type 2, Oral Hypoglycemic Agents  Renal/GU      Musculoskeletal   Abdominal   Peds  Hematology   Anesthesia Other Findings   Reproductive/Obstetrics                           Anesthesia Physical Anesthesia Plan  ASA: III  Anesthesia Plan: Spinal   Post-op Pain Management:    Induction:   Airway Management Planned: Nasal Cannula  Additional Equipment:   Intra-op Plan:   Post-operative Plan:   Informed Consent: I have reviewed the patients History and Physical, chart, labs and discussed the procedure including the risks, benefits and alternatives for the proposed anesthesia with the patient or authorized representative who has indicated his/her understanding and acceptance.     Plan Discussed with:   Anesthesia Plan Comments:         Anesthesia Quick Evaluation

## 2012-07-21 DIAGNOSIS — S72009D Fracture of unspecified part of neck of unspecified femur, subsequent encounter for closed fracture with routine healing: Secondary | ICD-10-CM

## 2012-07-21 DIAGNOSIS — W010XXA Fall on same level from slipping, tripping and stumbling without subsequent striking against object, initial encounter: Secondary | ICD-10-CM

## 2012-07-21 LAB — TYPE AND SCREEN: Unit division: 0

## 2012-07-21 LAB — GLUCOSE, CAPILLARY
Glucose-Capillary: 142 mg/dL — ABNORMAL HIGH (ref 70–99)
Glucose-Capillary: 142 mg/dL — ABNORMAL HIGH (ref 70–99)
Glucose-Capillary: 159 mg/dL — ABNORMAL HIGH (ref 70–99)

## 2012-07-21 LAB — CBC
Hemoglobin: 11.1 g/dL — ABNORMAL LOW (ref 12.0–15.0)
MCH: 29.6 pg (ref 26.0–34.0)
Platelets: 163 10*3/uL (ref 150–400)
RBC: 3.75 MIL/uL — ABNORMAL LOW (ref 3.87–5.11)
WBC: 5.6 10*3/uL (ref 4.0–10.5)

## 2012-07-21 LAB — BASIC METABOLIC PANEL
CO2: 21 mEq/L (ref 19–32)
Chloride: 109 mEq/L (ref 96–112)
Glucose, Bld: 136 mg/dL — ABNORMAL HIGH (ref 70–99)
Potassium: 6.3 mEq/L (ref 3.5–5.1)
Sodium: 136 mEq/L (ref 135–145)

## 2012-07-21 MED ORDER — SODIUM POLYSTYRENE SULFONATE 15 GM/60ML PO SUSP
30.0000 g | Freq: Once | ORAL | Status: AC
  Administered 2012-07-21: 30 g via ORAL
  Filled 2012-07-21: qty 120

## 2012-07-21 MED ORDER — ACETAMINOPHEN 325 MG PO TABS
650.0000 mg | ORAL_TABLET | ORAL | Status: DC | PRN
  Administered 2012-07-21: 650 mg via ORAL
  Filled 2012-07-21: qty 2

## 2012-07-21 MED ORDER — HYDRALAZINE HCL 25 MG PO TABS
25.0000 mg | ORAL_TABLET | Freq: Three times a day (TID) | ORAL | Status: DC
  Administered 2012-07-21 – 2012-07-22 (×4): 25 mg via ORAL
  Filled 2012-07-21 (×4): qty 1

## 2012-07-21 MED ORDER — HYDROCODONE-ACETAMINOPHEN 5-325 MG PO TABS: 1.0000 | ORAL_TABLET | ORAL | Status: DC | PRN

## 2012-07-21 MED ORDER — CIPROFLOXACIN HCL 250 MG PO TABS
500.0000 mg | ORAL_TABLET | Freq: Two times a day (BID) | ORAL | Status: DC
  Administered 2012-07-21 – 2012-07-22 (×2): 500 mg via ORAL
  Filled 2012-07-21 (×2): qty 2

## 2012-07-21 MED ORDER — ENOXAPARIN SODIUM 30 MG/0.3ML ~~LOC~~ SOLN
30.0000 mg | SUBCUTANEOUS | Status: DC
  Administered 2012-07-21: 30 mg via SUBCUTANEOUS
  Filled 2012-07-21: qty 0.3

## 2012-07-21 MED ORDER — SODIUM CHLORIDE 0.9 % IV SOLN
1.0000 g | Freq: Once | INTRAVENOUS | Status: AC
  Administered 2012-07-21: 1 g via INTRAVENOUS
  Filled 2012-07-21: qty 10

## 2012-07-21 NOTE — Progress Notes (Signed)
Subjective: 1 Day Post-Op Procedure(s) (LRB): INTRAMEDULLARY (IM) NAIL INTERTROCHANTRIC (Right) Patient reports pain as mild.    Objective: Vital signs in last 24 hours: Temp:  [98.2 F (36.8 C)-99.3 F (37.4 C)] 98.4 F (36.9 C) (05/29 0350) Pulse Rate:  [56-78] 78 (05/29 0350) Resp:  [11-30] 18 (05/29 0400) BP: (119-180)/(48-99) 143/59 mmHg (05/29 0350) SpO2:  [94 %-100 %] 96 % (05/29 0610) Weight:  [100 lb (45.36 kg)] 100 lb (45.36 kg) (05/28 1206)  Intake/Output from previous day: 05/28 0701 - 05/29 0700 In: 4765 [P.O.:120; I.V.:4645] Out: [id number] [Urine:1750; Stool:1; Blood:100] Intake/Output this shift:     Recent Labs  07/19/12 0619 07/21/12 0601  HGB 12.0 11.1*    Recent Labs  07/19/12 0619 07/21/12 0601  WBC 6.4 5.6  RBC 4.20 3.75*  HCT 35.8* 32.1*  PLT 170 163    Recent Labs  07/19/12 0619 07/21/12 0601  NA 141 136  K 4.9 6.3*  CL 111 109  CO2 23 21  BUN 16 14  CREATININE 1.78* 1.62*  GLUCOSE 109* 136*  CALCIUM 8.4 8.3*   No results found for this basename: LABPT, INR,  in the last 72 hours  Neurologically intact Neurovascular intact Sensation intact distally Intact pulses distally Dorsiflexion/Plantar flexion intact  Assessment/Plan: 1 Day Post-Op Procedure(s) (LRB): INTRAMEDULLARY (IM) NAIL INTERTROCHANTRIC (Right) Advance diet Up with therapy  Courtney [name] 07/21/2012, 7:40 AM

## 2012-07-21 NOTE — Clinical Social Work Note (Signed)
Pt did well with therapy today. Recommendation is for SNF. CSW will fax PT notes to [facility] to begin authorization. Spoke with Mardene Celeste at [facility] who is aware hoping for d/c tomorrow. She will notify CSW if anything else is needed for authorization process.  Benay Pike, [place]

## 2012-07-21 NOTE — Evaluation (Signed)
Clinical/Bedside Swallow Evaluation  Patient Details  Name: Courtney [name] MRN: [id number] Date of Birth: 06/03/1940  Today's Date: 07/21/2012 Time: 1450-1510 SLP Time Calculation (min): 20 min  Past Medical History:  Past Medical History  Diagnosis Date  . Diabetes mellitus without complication   . Hypertension    Past Surgical History:  Past Surgical History  Procedure Laterality Date  . Appendectomy     HPI:  Pt had syncopal episode at home and fell and fractured hip; post-op day 1. Past medical history is significant for stroke. Pt denies dysphagia.   Assessment / Plan / Recommendation Clinical Impression  No signs/symptoms of aspiration at bedside with textures and consistencies presented. Pt occasionally wears her dentures when she eats, however they are at home. She may benefit from mechanical soft diet until dentures can be brought in. No further SLP services indicated. Reconsult as needed.    Aspiration Risk  None    Diet Recommendation Dysphagia 3 (Mechanical Soft);Thin liquid (regular textures if she has dentures)   Liquid Administration via: Straw;Cup Medication Administration: Whole meds with liquid Supervision: Patient able to self feed Postural Changes and/or Swallow Maneuvers: Seated upright 90 degrees;Upright 30-60 min after meal    Other  Recommendations Oral Care Recommendations: Oral care BID Other Recommendations: Clarify dietary restrictions   Follow Up Recommendations  None    Frequency and Duration  N/A          Swallow Study Prior Functional Status       General Date of Onset: 07/17/12 HPI: Pt had syncopal episode at home and fell and fractured hip; post-op day 1. Past medical history is significant for stroke. Pt denies dysphagia. Type of Study: Bedside swallow evaluation Diet Prior to this Study: Regular Criss Rosales) Temperature Spikes Noted: No History of Recent Intubation: Yes Behavior/Cognition: Alert;Cooperative;Pleasant mood Oral Cavity  - Dentition: Edentulous (Pt wears dentures at home, not present) Self-Feeding Abilities: Able to feed self Patient Positioning: Upright in bed Baseline Vocal Quality: Clear Volitional Cough: Strong Volitional Swallow: Able to elicit    Oral/Motor/Sensory Function Overall Oral Motor/Sensory Function: Appears within functional limits for tasks assessed   Ice Chips Ice chips: Within functional limits Presentation: Spoon   Thin Liquid Thin Liquid: Within functional limits Presentation: Cup;Self Fed;Straw    Nectar Thick Nectar Thick Liquid: Not tested   Honey Thick Honey Thick Liquid: Not tested   Puree Puree: Within functional limits Presentation: Spoon   Solid       Solid: Within functional limits Presentation: Self Fed Other Comments: WFL in setting of edentulous status       Ova Meegan 07/21/2012,3:29 PM

## 2012-07-21 NOTE — Progress Notes (Signed)
pts diet advanced to carb modified per order to advance as tolerated. SLP evaluated pt and states that dys 3 diet is appropriate. I will notify night nurse of this recommendation. Marry Guan

## 2012-07-21 NOTE — Progress Notes (Addendum)
0800: Night shift RN notified Dr. Megan Salon of pts critical potassium level of 6.3. Calcium gluconate was ordered. I notified Dr. Roderic Palau this morning that pts IVF containing potassium hadn't been ordered to d/c. Order received to d/c IV fluids altogether, and administer Kayexalate. 1630: Kayexalate was given at ordered time, and patient has had multiple watery BMs since administration.  Marry Guan

## 2012-07-21 NOTE — Progress Notes (Signed)
UR chart review completed.  

## 2012-07-21 NOTE — Evaluation (Signed)
Physical Therapy Evaluation Patient Details Name: Courtney [name] MRN: [id number] DOB: Mar 19, 1940 Today's Date: 07/21/2012 Time: [id number] PT Time Calculation (min): 44 min  PT Assessment / Plan / Recommendation Clinical Impression  Pt was seen for eval/tx following OTIF for R hip fx..  Pt has old R hemiparesis with mild flexor spasticity throughout the R side.  She is alert, oriented and very cooperative, no pain reported at rest or with movement.  She normally uses a walker for gait and  normally does not need assist for ADLs.  We initiated ther ex per protocol but we did not do any isometrics due to RLE spasticity.  She did very well with transfers from bed to chair but is not yet ready for gait.  She is well motivated and should progress well with PT.  She will need SNF at d/c.         PT Assessment  Patient needs continued PT services    Follow Up Recommendations  SNF    Does the patient have the potential to tolerate intense rehabilitation      Barriers to Discharge Inaccessible home environment 4 steps to entry    Equipment Recommendations  None recommended by PT    Recommendations for Other Services OT consult   Frequency Min 6X/week    Precautions / Restrictions Precautions Precautions: Fall Restrictions Weight Bearing Restrictions: No Other Position/Activity Restrictions: WBAT R   Pertinent Vitals/Pain       Mobility  Bed Mobility Bed Mobility: Supine to Sit;Sit to Supine Supine to Sit: 3: Mod assist;HOB elevated Transfers Transfers: Sit to Stand;Stand to Sit;Stand Pivot Transfers Sit to Stand: 4: Min assist;With upper extremity assist;From bed Stand to Sit: 4: Min assist;With upper extremity assist;To chair/3-in-1 Stand Pivot Transfers: 4: Min assist Ambulation/Gait Ambulation/Gait Assistance: Not tested (comment)    Exercises General Exercises - Lower Extremity Ankle Circles/Pumps: AROM;Left;10 reps;Supine Short Arc Quad: AROM;AAROM;Both;10 reps;Supine Heel  Slides: AAROM;AROM;Both;10 reps;Supine Hip ABduction/ADduction: AROM;AAROM;Both;10 reps;Supine   PT Diagnosis: Difficulty walking;Generalized weakness;Hemiplegia dominant side  PT Problem List: Decreased strength;Decreased activity tolerance;Decreased mobility;Decreased range of motion;Impaired tone PT Treatment Interventions: Gait training;Functional mobility training;Therapeutic activities;Therapeutic exercise;Patient/family education   PT Goals Acute Rehab PT Goals PT Goal Formulation: With patient Time For Goal Achievement: 07/28/12 Potential to Achieve Goals: Good Pt will go Supine/Side to Sit: with min assist;with HOB not 0 degrees (comment degree) PT Goal: Supine/Side to Sit - Progress: Goal set today Pt will go Sit to Supine/Side: with min assist;with HOB not 0 degrees (comment degree) PT Goal: Sit to Supine/Side - Progress: Goal set today Pt will go Sit to Stand: with supervision;with upper extremity assist PT Goal: Sit to Stand - Progress: Goal set today Pt will go Stand to Sit: with supervision;with upper extremity assist PT Goal: Stand to Sit - Progress: Goal set today Pt will Transfer Bed to Chair/Chair to Bed: with supervision Pt will Ambulate: 1 - 15 feet;with min assist;with rolling walker PT Goal: Ambulate - Progress: Goal set today  Visit Information  Last PT Received On: 07/21/12    Subjective Data  Subjective: no c/o Patient Stated Goal: return home   Prior Functioning  Home Living Lives With: Son Available Help at Discharge: Family;Available 24 hours/day Type of Home: Mobile home Home Access: Stairs to enter Entrance Stairs-Number of Steps: 4 Entrance Stairs-Rails: None Home Layout: One level Bathroom Shower/Tub: Chiropodist: Standard Home Adaptive Equipment: Walker - rolling;Tub transfer bench;Wheelchair - manual;Straight cane Prior Function Level of Independence: Independent with  assistive device(s) Able to Take Stairs?: Yes  (with son's assist) Driving: No Vocation: Retired Corporate investment banker: No difficulties    Solicitor Arousal/Alertness: Awake/alert Behavior During Therapy: WFL for tasks assessed/performed Overall Cognitive Status: Within Functional Limits for tasks assessed    Extremity/Trunk Assessment Right Lower Extremity Assessment RLE ROM/Strength/Tone: Deficits RLE ROM/Strength/Tone Deficits: old R hemiparesis with min to mod flexor tone...movement of foot sets off spasticity RLE Sensation: WFL - Light Touch Left Lower Extremity Assessment LLE ROM/Strength/Tone: WFL for tasks assessed Trunk Assessment Trunk Assessment: Normal   Balance Balance Balance Assessed: Yes Static Sitting Balance Static Sitting - Balance Support: No upper extremity supported;Feet supported Static Sitting - Level of Assistance: 5: Stand by assistance  End of Session PT - End of Session Equipment Utilized During Treatment: Gait belt Activity Tolerance: Patient tolerated treatment well Patient left: in chair;with call bell/phone within reach  GP     Demetrios Isaacs L 07/21/2012, 10:35 AM

## 2012-07-21 NOTE — Anesthesia Postprocedure Evaluation (Signed)
  Anesthesia Post-op Note  Patient: Courtney [name]  Procedure(s) Performed: Procedure(s): INTRAMEDULLARY (IM) NAIL INTERTROCHANTRIC (Right)  Patient Location: room 334  Anesthesia Type:Spinal  Level of Consciousness: awake, alert , oriented and patient cooperative  Airway and Oxygen Therapy: Patient Spontanous Breathing and Patient connected to nasal cannula oxygen  Post-op Pain: none  Post-op Assessment: Post-op Vital signs reviewed, Patient's Cardiovascular Status Stable, Respiratory Function Stable, Patent Airway, No signs of Nausea or vomiting and Pain level controlled  Post-op Vital Signs: Reviewed and stable  Complications: No apparent anesthesia complications

## 2012-07-21 NOTE — Progress Notes (Signed)
Received call from lab potassium level (am labs) 6.3, text paged Dr. Megan Salon, await response, this writer stopped continuous IVF NS w/ 20 KCL until further notice.

## 2012-07-21 NOTE — Progress Notes (Signed)
See new orders concerning previous nurses note( elevated potassium level), foley catheter removed without difficulty.

## 2012-07-21 NOTE — Progress Notes (Signed)
[facility] PROGRESS NOTE  Courtney [name] [id number] DOB: November 04, 1940 DOA: 07/17/2012 PCP: Neale Burly, MD  Assessment/Plan: 1. Syncope. Workup thus far has been negative. Maybe related to dehydration. 2. UTI. She is on Rocephin. Urine culture positive for Klebsiella. We'll switch to ciprofloxacin to complete the course. Today is day 4 of antibiotics. 3. Right hip fracture status post operative repair. Orthopedics following. Patient be continued physical therapy at skilled nursing facility. 4. Hypertension. Patient is on Norvasc and labetalol. At hydralazine. 5. Type of diabetes. Continue Celexa insulin. 6. Chronic kidney disease. Creatinine appears to be a baseline. 7. Anemia, chronic. Likely related to chronic kidney disease. She may develop an element of acute blood loss due to recent surgery. Continue to follow. She does not appear to need a blood transfusion at this time 8. Hyperkalemia. Likely related to potassium replacement. Patient given a dose of Kayexalate and we will recheck potassium today. 9. DVT prophylaxis. Restart Lovenox.  Code Status: Full code Family Communication: Discussed with patient Disposition Plan: Discharge to skilled nursing facility, hopefully tomorrow   Consultants:  Orthopedics, Dr. Aline Brochure  Procedures: intramedullary nail right hip fracture with gamma nail   Antibiotics:  Rocephin 5/26  HPI/Subjective: Denies any complaints today. Pain appears to be reasonably controlled.  Objective: Filed Vitals:   07/21/12 0610 07/21/12 1053 07/21/12 1114 07/21/12 1116  BP:  171/67 169/51   Pulse:  74  64  Temp:  97.9 F (36.6 C)    TempSrc:      Resp:  20    Height:      Weight:      SpO2: 96% 90%      Intake/Output Summary (Last 24 hours) at 07/21/12 1532 Last data filed at 07/21/12 0820  Gross per 24 hour  Intake   2030 ml  Output   1001 ml  Net   1029 ml   Filed Weights   07/17/12 1519 07/20/12 1206  Weight: 45.36 kg (100 lb)  45.36 kg (100 lb)    Exam:   General: No acute distress, sitting up in chair  Cardiovascular: S1, S2, regular rate and rhythm  Respiratory: Clear to auscultation bilaterally  Abdomen: Soft, nontender, nondistended, bowel sounds active  Musculoskeletal: No pedal edema bilaterally   Data Reviewed: Basic Metabolic Panel:  Recent Labs Lab 07/17/12 1550 07/18/12 0633 07/19/12 0619 07/21/12 0601  NA 140 139 141 136  K 3.4* 3.6 4.9 6.3*  CL 102 106 111 109  CO2 27 24 23 21   GLUCOSE 211* 106* 109* 136*  BUN 20 17 16 14   CREATININE 1.75* 1.73* 1.78* 1.62*  CALCIUM 9.2 8.1* 8.4 8.3*   Liver Function Tests:  Recent Labs Lab 07/17/12 1550 07/18/12 0633  AST 22 17  ALT 18 14  ALKPHOS 111 94  BILITOT 0.6 0.6  PROT 7.2 5.8*  ALBUMIN 3.4* 2.9*   No results found for this basename: LIPASE, AMYLASE,  in the last 168 hours No results found for this basename: AMMONIA,  in the last 168 hours CBC:  Recent Labs Lab 07/17/12 1550 07/18/12 0633 07/19/12 0619 07/21/12 0601  WBC 6.8 5.1 6.4 5.6  HGB 10.3* 9.1* 12.0 11.1*  HCT 29.6* 26.5* 35.8* 32.1*  MCV 83.4 84.1 85.2 85.6  PLT 193 178 170 163   Cardiac Enzymes:  Recent Labs Lab 07/17/12 1850 07/17/12 2316 07/18/12 0633  TROPONINI <0.30 <0.30 <0.30   BNP (last 3 results) No results found for this basename: PROBNP,  in the last 8760 hours CBG:  Recent Labs Lab 07-24-2012 1511 Jul 24, 2012 1702 07/24/2012 2152 07/21/12 0758 07/21/12 1127  GLUCAP 105* 108* 148* 103* 142*    Recent Results (from the past 240 hour(s))  URINE CULTURE     Status: None   Collection Time    07/17/12  5:51 PM      Result Value Range Status   Specimen Description URINE, CATHETERIZED   Final   Special Requests NONE   Final   Culture  Setup Time 07/17/2012 21:33   Final   Colony Count >=100,000 COLONIES/ML   Final   Culture KLEBSIELLA PNEUMONIAE   Final   Report Status Jul 24, 2012 FINAL   Final   Organism ID, Bacteria KLEBSIELLA  PNEUMONIAE   Final  MRSA PCR SCREENING     Status: None   Collection Time    07/19/12 11:28 PM      Result Value Range Status   MRSA by PCR NEGATIVE  NEGATIVE Final   Comment:            The GeneXpert MRSA Assay (FDA     approved for NASAL specimens     only), is one component of a     comprehensive MRSA colonization     surveillance program. It is not     intended to diagnose MRSA     infection nor to guide or     monitor treatment for     MRSA infections.  SURGICAL PCR SCREEN     Status: None   Collection Time    2012-07-24  7:51 AM      Result Value Range Status   MRSA, PCR NEGATIVE  NEGATIVE Final   Staphylococcus aureus NEGATIVE  NEGATIVE Final   Comment:            The Xpert SA Assay (FDA     approved for NASAL specimens     in patients over 11 years of age),     is one component of     a comprehensive surveillance     program.  Test performance has     been validated by [employer] for patients greater     than or equal to 54 year old.     It is not intended     to diagnose infection nor to     guide or monitor treatment.     Studies: Dg Hip Operative Right  07/24/12   *RADIOLOGY REPORT*  Clinical Data: Hip fracture  DG OPERATIVE RIGHT HIP  Comparison: None.  Findings: Multiple intraoperative spot images demonstrate placement of a dynamic compression screw and intramedullary rod transfixing an intertrochanteric fracture.  Anatomic alignment.  No breakage or loosening of the hardware.  IMPRESSION: ORIF right intertrochanteric femur fracture.   Original Report Authenticated By: Marybelle Killings, M.D.    Scheduled Meds: . amLODipine  5 mg Oral Daily  . aspirin EC  325 mg Oral Q breakfast  . cefTRIAXone (ROCEPHIN)  IV  1 g Intravenous Q24H  . glimepiride  2 mg Oral Daily  . insulin aspart  0-15 Units Subcutaneous TID WC  . insulin aspart  0-5 Units Subcutaneous QHS  . labetalol  100 mg Oral BID  . sodium chloride  3 mL Intravenous Q12H   Continuous Infusions:    Active Problems:   Syncope   Closed right hip fracture   HTN (hypertension)   DM (diabetes mellitus)   Cerebrovascular disease    Time spent:     Nykia Turko  [facility] Pager [phone]. If 7PM-7AM, please contact night-coverage at [email or web address], password [facility] 07/21/2012, 3:32 PM  LOS: 4 days

## 2012-07-22 LAB — GLUCOSE, CAPILLARY
Glucose-Capillary: 63 mg/dL — ABNORMAL LOW (ref 70–99)
Glucose-Capillary: 108 mg/dL — ABNORMAL HIGH (ref 70–99)

## 2012-07-22 LAB — BASIC METABOLIC PANEL
BUN: 15 mg/dL (ref 6–23)
Calcium: 8.2 mg/dL — ABNORMAL LOW (ref 8.4–10.5)
GFR calc Af Amer: 34 mL/min — ABNORMAL LOW (ref >90)
GFR calc non Af Amer: 30 mL/min — ABNORMAL LOW (ref >90)
Glucose, Bld: 63 mg/dL — ABNORMAL LOW (ref 70–99)
Potassium: 3.9 mEq/L (ref 3.5–5.1)
Sodium: 141 mEq/L (ref 135–145)

## 2012-07-22 LAB — CBC
Hemoglobin: 10.5 g/dL — ABNORMAL LOW (ref 12.0–15.0)
MCH: 29.2 pg (ref 26.0–34.0)
MCHC: 34.5 g/dL (ref 30.0–36.0)
RDW: 13.5 % (ref 11.5–15.5)

## 2012-07-22 MED ORDER — HYDRALAZINE HCL 25 MG PO TABS: 25.0000 mg | ORAL_TABLET | Freq: Three times a day (TID) | ORAL | Status: DC

## 2012-07-22 MED ORDER — ENOXAPARIN SODIUM 30 MG/0.3ML ~~LOC~~ SOLN: 30.0000 mg | SUBCUTANEOUS | Status: DC

## 2012-07-22 MED ORDER — HYDROCODONE-ACETAMINOPHEN 5-325 MG PO TABS: 1.0000 | ORAL_TABLET | ORAL | Status: DC | PRN

## 2012-07-22 MED ORDER — LOPERAMIDE HCL 2 MG PO CAPS: 2.0000 mg | ORAL_CAPSULE | Freq: Four times a day (QID) | ORAL | Status: DC | PRN

## 2012-07-22 MED ORDER — CIPROFLOXACIN HCL 500 MG PO TABS: 500.0000 mg | ORAL_TABLET | Freq: Two times a day (BID) | ORAL | Status: DC

## 2012-07-22 MED ORDER — LOPERAMIDE HCL 2 MG PO CAPS
2.0000 mg | ORAL_CAPSULE | ORAL | Status: DC | PRN
  Administered 2012-07-22 (×2): 2 mg via ORAL
  Filled 2012-07-22 (×2): qty 1

## 2012-07-22 NOTE — Progress Notes (Signed)
Inpatient Diabetes Program Recommendations  AACE/ADA: New Consensus Statement on Inpatient Glycemic Control (2013)  Target Ranges:  Prepandial:   less than 140 mg/dL      Peak postprandial:   less than 180 mg/dL (1-2 hours)      Critically ill patients:  140 - 180 mg/dL   Results for [name], Courtney [name] (MRN [id number]) as of 07/22/2012 08:31  Ref. Range 07/21/2012 07:58 07/21/2012 11:27 07/21/2012 16:50 07/21/2012 21:11 07/22/2012 07:43  Glucose-Capillary Latest Range: 70-99 mg/dL 103 (H) 142 (H) 142 (H) 159 (H) 63 (L)    Inpatient Diabetes Program Recommendations Correction (SSI): Please consider decreasing Novolog correction to sensitive scale.  Note: Fasting blood glucose this morning was 63 mg/dl.  Please consider decreasing Novolog correction to sensitive scale.  Thanks, Barnie Alderman, RN, MSN, CCRN Diabetes Coordinator Inpatient Diabetes Program [phone]

## 2012-07-22 NOTE — Progress Notes (Signed)
Subjective: 2 Days Post-Op Procedure(s) (LRB): INTRAMEDULLARY (IM) NAIL INTERTROCHANTRIC (Right) Patient reports pain as 0 on 0-10 scale.    Objective: Vital signs in last 24 hours: Temp:  [97.6 F (36.4 C)-101.8 F (38.8 C)] 98.5 F (36.9 C) (05/30 0506) Pulse Rate:  [62-81] 62 (05/30 0506) Resp:  [16-20] 20 (05/30 1129) BP: (146-177)/(64-72) 146/72 mmHg (05/30 0506) SpO2:  [90 %-100 %] 95 % (05/30 1129)  Intake/Output from previous day: 05/29 0701 - 05/30 0700 In: 2290 [P.O.:480; I.V.:1810] Out: 400 [Urine:400] Intake/Output this shift:     Recent Labs  07/21/12 0601 07/22/12 0604  HGB 11.1* 10.5*    Recent Labs  07/21/12 0601 07/22/12 0604  WBC 5.6 5.6  RBC 3.75* 3.59*  HCT 32.1* 30.4*  PLT 163 185    Recent Labs  07/21/12 0601 07/21/12 1639 07/22/12 0604  NA 136  --  141  K 6.3* 4.7 3.9  CL 109  --  109  CO2 21  --  22  BUN 14  --  15  CREATININE 1.62*  --  1.69*  GLUCOSE 136*  --  63*  CALCIUM 8.3*  --  8.2*   No results found for this basename: LABPT, INR,  in the last 72 hours  Neurologically intact Neurovascular intact Sensation intact distally Intact pulses distally Incision: dressing C/D/I Compartment soft  Assessment/Plan: 2 Days Post-Op Procedure(s) (LRB): INTRAMEDULLARY (IM) NAIL INTERTROCHANTRIC (Right) Up with therapy Discharge to SNF  [facility] Orthopaedic Instructions for Gamma Nail  RIGHT  hip fracture   S/P gamma nail   Weight bearing as tolerated   Remove staples POD # 12  F/U in 1 month for x-rays   Lovenox x 28 days    Arther Abbott 07/22/2012, 12:01 PM

## 2012-07-22 NOTE — Progress Notes (Signed)
Physical Therapy Treatment Patient Details Name: Courtney [name] MRN: [id number] DOB: 1940-03-31 Today's Date: 07/22/2012 Time: [id number] PT Time Calculation (min): 56 min  PT Assessment / Plan / Recommendation Comments on Treatment Session  Pt continues to be very cooperative and well motivated, currently limited by frequent bouts of diarrhea.  She is improving with ability to transfer bed to chair, needing less assistance and is now able to use a walker for transfer.      Follow Up Recommendations        Does the patient have the potential to tolerate intense rehabilitation     Barriers to Discharge        Equipment Recommendations       Recommendations for Other Services    Frequency     Plan Discharge plan remains appropriate;Frequency remains appropriate    Precautions / Restrictions     Pertinent Vitals/Pain     Mobility  Bed Mobility Supine to Sit: 3: Mod assist;HOB flat Details for Bed Mobility Assistance: pt was lying in a pool of diarrhea and was limited on how much assistance she could give with mobility because of this Transfers Sit to Stand: 4: Min guard;With upper extremity assist;From bed;From chair/3-in-1 Stand to Sit: 4: Min guard;With upper extremity assist;To chair/3-in-1 Stand Pivot Transfers: 4: Min guard Details for Transfer Assistance: pt had to transfer 3 times to [facility] because of frequent bowel motility...she attempted transfer using a walker, but was unable to off load her body weight onto the walker to take a step...however, she was able to slide each foot in order to transfer successfully from [facility] to chair Ambulation/Gait Ambulation/Gait Assistance: Not tested (comment)    Exercises General Exercises - Lower Extremity Ankle Circles/Pumps: AROM;Left;10 reps;Seated Short Arc Quad: AROM;AAROM;Both;10 reps;Seated Long Arc Quad: AROM;AAROM;Both;10 reps;Seated Heel Slides: AAROM;Right;10 reps;Seated Hip ABduction/ADduction: AAROM;Right;10 reps;Seated    PT Diagnosis:    PT Problem List:   PT Treatment Interventions:     PT Goals Acute Rehab PT Goals PT Goal: Supine/Side to Sit - Progress: Progressing toward goal PT Goal: Sit to Stand - Progress: Progressing toward goal PT Goal: Stand to Sit - Progress: Progressing toward goal PT Transfer Goal: Bed to Chair/Chair to Bed - Progress: Progressing toward goal  Visit Information  Last PT Received On: 07/22/12    Subjective Data  Subjective: very concerned about persistent diarrhea   Cognition       Balance     End of Session PT - End of Session Equipment Utilized During Treatment: Gait belt Activity Tolerance: Patient tolerated treatment well Patient left: in chair;with call bell/phone within reach;with nursing in room Nurse Communication: Mobility status   GP     Courtney [name] 07/22/2012, 10:59 AM

## 2012-07-22 NOTE — Evaluation (Signed)
Occupational Therapy Evaluation Patient Details Name: Courtney [name] MRN: [id number] DOB: 1940-04-25 Today's Date: 07/22/2012 Time: [id number] OT Time Calculation (min): 18 min  OT Assessment / Plan / Recommendation Clinical Impression  Patient is a 72 y/o female s/p syncope presenting to acute OT with deficits below. Pt will benefit from OT services to increase ADL performance, functional transfers, and BUE strength and endurance. Recommend SNF at D/C.    OT Assessment  Patient needs continued OT Services    Follow Up Recommendations  SNF    Barriers to Discharge Inaccessible home environment 4 steps to enter home.  Equipment Recommendations  None recommended by OT       Frequency  Min 2X/week    Precautions / Restrictions Precautions Precautions: Fall   Pertinent Vitals/Pain No complaints.    ADL  Toilet Transfer: Performed;Moderate assistance Toilet Transfer Method: Sit to stand Toilet Transfer Equipment: Bedside commode Toileting - Clothing Manipulation and Hygiene: Performed;+1 Total assistance Where Assessed - Toileting Clothing Manipulation and Hygiene: Standing Transfers/Ambulation Related to ADLs: Pt required Max Assist with follow-through of left foot when ambulating. Pt has increased difficulty weightbearing on right leg.     OT Diagnosis: Acute pain;Generalized weakness  OT Problem List: Decreased strength;Decreased activity tolerance;Impaired balance (sitting and/or standing);Decreased knowledge of use of DME or AE;Pain OT Treatment Interventions: Self-care/ADL training;Therapeutic activities;Therapeutic exercise;DME and/or AE instruction;Manual therapy;Modalities;Balance training;Patient/family education   OT Goals Acute Rehab OT Goals OT Goal Formulation: With patient Time For Goal Achievement: 07/29/12 Potential to Achieve Goals: Good ADL Goals Pt Will Perform Lower Body Bathing: with min assist ADL Goal: Lower Body Bathing - Progress: Goal set today Pt  Will Perform Lower Body Dressing: with min assist ADL Goal: Lower Body Dressing - Progress: Goal set today Pt Will Transfer to Toilet: with min assist ADL Goal: Toilet Transfer - Progress: Goal set today Pt Will Perform Toileting - Clothing Manipulation: with min assist ADL Goal: Toileting - Clothing Manipulation - Progress: Goal set today Pt Will Perform Toileting - Hygiene: with min assist ADL Goal: Toileting - Hygiene - Progress: Goal set today Arm Goals Pt Will Complete Theraband Exer: with supervision, verbal cues required/provided;to maintain strength;Bilateral upper extremities;2 sets;15 reps;Level 2 Theraband Arm Goal: Theraband Exercises - Progress: Goal set today  Visit Information  Last OT Received On: 07/22/12 Assistance Needed: +1    Subjective Data  Subjective: S: I hate to be a burden on people.  Patient Stated Goal: "To go to the bathroom."   Prior Functioning     Home Living Lives With: Son Available Help at Discharge: Family;Available 24 hours/day Type of Home: Mobile home Home Access: Stairs to enter Entrance Stairs-Number of Steps: 4 Entrance Stairs-Rails: None Home Layout: One level Bathroom Shower/Tub: Chiropodist: Standard Home Adaptive Equipment: Walker - rolling;Tub transfer bench;Wheelchair - Corporate investment banker Prior Function Level of Independence: Independent with assistive device(s) Able to Take Stairs?: Yes Driving: No Vocation: Retired Corporate investment banker: No difficulties Dominant Hand: Right         Vision/Perception Vision - History Baseline Vision: Wears glasses all the time Patient Visual Report: No change from baseline   Cognition  Cognition Arousal/Alertness: Awake/alert Behavior During Therapy: WFL for tasks assessed/performed Overall Cognitive Status: Within Functional Limits for tasks assessed    Extremity/Trunk Assessment Right Upper Extremity Assessment RUE ROM/Strength/Tone: WFL for  tasks assessed RUE Coordination: WFL - gross/fine motor Left Upper Extremity Assessment LUE ROM/Strength/Tone: WFL for tasks assessed LUE Coordination: WFL - gross/fine motor  Mobility Bed Mobility Supine to Sit: 3: Mod assist;HOB flat Details for Bed Mobility Assistance: pt was lying in a pool of diarrhea and was limited on how much assistance she could give with mobility because of this Transfers Sit to Stand: 4: Min assist;With upper extremity assist;From chair/3-in-1 Stand to Sit: 4: Min assist;To chair/3-in-1;With upper extremity assist Details for Transfer Assistance: Patient ambulated with RW approx 4 feet with vc's for technique and safety.            End of Session OT - End of Session Equipment Utilized During Treatment: Gait belt Activity Tolerance: Patient tolerated treatment well Patient left: in chair;with call bell/phone within reach;with chair alarm set    Ailene Ravel, OTR/L,CBIS   07/22/2012, 11:48 AM

## 2012-07-22 NOTE — Clinical Social Work Note (Signed)
Pt d/c today to [facility]. Pt and facility aware and agreeable. Insurance authorization received per Mardene Celeste at [facility]. Pt to transfer via facility van. D/C summary faxed. CSW left voicemail for pt's son regarding d/c at her request.  Courtney [name], [place]

## 2012-07-22 NOTE — Anesthesia Postprocedure Evaluation (Signed)
  Anesthesia Post-op Note  Patient: Courtney [name]  Procedure(s) Performed: Procedure(s): INTRAMEDULLARY (IM) NAIL INTERTROCHANTRIC (Right)  Patient Location: Room 334  Anesthesia Type:Spinal  Level of Consciousness: awake, alert , oriented and patient cooperative  Airway and Oxygen Therapy: Patient Spontanous Breathing  Post-op Pain: mild  Post-op Assessment: Post-op Vital signs reviewed, Patient's Cardiovascular Status Stable, Respiratory Function Stable, Patent Airway, No signs of Nausea or vomiting and Adequate PO intake  Post-op Vital Signs: Reviewed and stable  Complications: No apparent anesthesia complications

## 2012-07-22 NOTE — Progress Notes (Signed)
Physical Therapy Treatment Patient Details Name: Courtney [name] MRN: [id number] DOB: Dec 13, 1940 Today's Date: 07/22/2012 Time: [id number] PT Time Calculation (min): 31 min  PT Assessment / Plan / Recommendation Comments on Treatment Session  Pt is making slow progress and was able to walk 5' with a walker and moderate assistance.  Gait is very labored as she has pain in right LE with weight bearing.  This should improve with time.    Follow Up Recommendations        Does the patient have the potential to tolerate intense rehabilitation     Barriers to Discharge        Equipment Recommendations       Recommendations for Other Services    Frequency     Plan Discharge plan remains appropriate;Frequency remains appropriate    Precautions / Restrictions Precautions Precautions: Fall   Pertinent Vitals/Pain     Mobility  Bed Mobility Supine to Sit: 3: Mod assist;HOB flat Sit to Supine: 4: Min assist Details for Bed Mobility Assistance: needs assist to lift right LE into bed Transfers Sit to Stand: 4: Min assist;With upper extremity assist;From chair/3-in-1 Stand to Sit: 4: Min guard;With upper extremity assist;To bed Stand Pivot Transfers: 4: Min guard Details for Transfer Assistance: Patient ambulated with RW approx 4 feet with vc's for technique and safety.  Ambulation/Gait Ambulation/Gait Assistance: 3: Mod assist Ambulation Distance (Feet): 5 Feet Assistive device: Rolling walker Ambulation/Gait Assistance Details: pt usually uses a standard walker and feels more secure with this.Marland KitchenMarland KitchenI did not know this until she was already up and taking steps Gait Pattern: Decreased stance time - right;Decreased hip/knee flexion - left Gait velocity: extremely slow and labored General Gait Details: pt is able to control right LE to take a step and place it on floor, however she was only able to off load weight from left LE to take a step...she had to slide leg forward Stairs: No Wheelchair  Mobility Wheelchair Mobility: No    Exercises General Exercises - Lower Extremity Ankle Circles/Pumps: AROM;Left;10 reps;Seated Short Arc Quad: AROM;AAROM;Both;10 reps;Seated Long Arc Quad: AROM;AAROM;Both;10 reps;Seated Heel Slides: AAROM;Right;10 reps;Seated Hip ABduction/ADduction: AAROM;Right;10 reps;Seated   PT Diagnosis:    PT Problem List:   PT Treatment Interventions:     PT Goals Acute Rehab PT Goals PT Goal: Supine/Side to Sit - Progress: Progressing toward goal PT Goal: Sit to Supine/Side - Progress: Met PT Goal: Sit to Stand - Progress: Progressing toward goal PT Goal: Stand to Sit - Progress: Progressing toward goal PT Transfer Goal: Bed to Chair/Chair to Bed - Progress: Progressing toward goal PT Goal: Ambulate - Progress: Progressing toward goal  Visit Information  Last PT Received On: 07/22/12 Assistance Needed: +1    Subjective Data  Subjective: no c/o   Cognition  Cognition Arousal/Alertness: Awake/alert Behavior During Therapy: WFL for tasks assessed/performed Overall Cognitive Status: Within Functional Limits for tasks assessed    Balance     End of Session PT - End of Session Equipment Utilized During Treatment: Gait belt Activity Tolerance: Patient tolerated treatment well Patient left: in bed;with call bell/phone within reach;with bed alarm set Nurse Communication: Mobility status   GP     Sable Feil 07/22/2012, 2:20 PM

## 2012-07-22 NOTE — Clinical Social Work Placement (Signed)
Clinical Social Work Department CLINICAL SOCIAL WORK PLACEMENT NOTE 07/22/2012  Patient:  Courtney [name], Courtney [name]  Account Number:  [id number] Admit date:  07/17/2012  Clinical Social Worker:  Benay Pike, LCSW  Date/time:  07/19/2012 03:05 PM  Clinical Social Work is seeking post-discharge placement for this patient at the following level of care:   SKILLED NURSING   (*CSW will update this form in Epic as items are completed)   07/19/2012  Patient/family provided with [facility] Department of Clinical Social Work's list of facilities offering this level of care within the geographic area requested by the patient (or if unable, by the patient's family).  07/19/2012  Patient/family informed of their freedom to choose among providers that offer the needed level of care, that participate in Medicare, Medicaid or managed care program needed by the patient, have an available bed and are willing to accept the patient.  07/19/2012  Patient/family informed of [facility]' ownership interest in [facility], as well as of the fact that they are under no obligation to receive care at this facility.  PASARR submitted to EDS on 07/19/2012 PASARR number received from EDS on 07/19/2012  FL2 transmitted to all facilities in geographic area requested by pt/family on  07/19/2012 FL2 transmitted to all facilities within larger geographic area on   Patient informed that his/her managed care company has contracts with or will negotiate with  certain facilities, including the following:     Patient/family informed of bed offers received:  07/20/2012 Patient chooses bed at [facility] Physician recommends and patient chooses bed at  [facility]  Patient to be transferred to [place] on  07/22/2012 Patient to be transferred to facility by [facility]  The following physician request were entered in Epic:   Additional Comments:  Benay Pike, [place]

## 2012-07-22 NOTE — Discharge Summary (Signed)
Physician Discharge Summary  Courtney [name] [id number] DOB: 12-16-1940 DOA: 07/17/2012  PCP: Neale Burly, MD  Admit date: 07/17/2012 Discharge date: 07/22/2012  Time spent: 40 minutes  Recommendations for Outpatient Follow-up:  1. Patient will be discharged to skilled nursing facility for continued physical therapy 2. Follow up with Dr. Aline Brochure in 1 month for xrays 3. Remove staples on 07/30/12 4. Weight bearing as tolearted 5. Follow up with primary care doctor when discharged from SNF  Discharge Diagnoses:  Active Problems:   Syncope   Closed right hip fracture   HTN (hypertension)   DM (diabetes mellitus)   Cerebrovascular disease   Discharge Condition: improved  Diet recommendation: low salt, low carb  Filed Weights   07/17/12 1519 07/20/12 1206  Weight: 45.36 kg (100 lb) 45.36 kg (100 lb)    History of present illness:  Courtney [name] is a 72 y.o. female who had a syncopal episode at 5 AM today. She was in the process of going from her bed to the bathroom when she suddenly lost consciousness. According to her and her son's history, she probably lost consciousness for less than 1 minute. She denies any chest pain, lightheadedness, palpitations with this episode. When she recovered consciousness, she does not remember feeling confused and her son denies that she was confused. She does tell me that she has episodes when she feels her heart stops for a few seconds. She has never had any history of heart disease. 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 She walks with a walker usually.  Once she was on the bathroom floor, she was not able to get up to cause she had severe pain in the right hip. She came to the emergency room and x-ray showed fractured right hip. She is diabetic and hypertensive.   Hospital Course:  This lady was admitted to the hospital after having a syncopal  episode and fall.  Unfortunately, she suffered a right sided hip fracture.  She was seen by Dr. Aline Brochure and underwent operative repair on 5/28.  She tolerated this procedure without any immediate complication.  She has been working with physical therapy and is felt appropriate to discharge to SNF.  She has been cleared for discharge by Orthopedics  She was also diagnosed with a UTI on admission.  She is on day 5/7 of antibiotics.  Urine culture grew klebsiella  Patient did develop diarrhea while in the hospital.  C diff was checked and found to be negative.  This could be due to antibiotics.  She is receiving imodium for this.  She did have a mild fever overnight, but has no signs of ongoing infection.  This may be due to atelectasis, and she has been encouraged to use incentive spirometry  She was found to have significant anemia, which was likely due to her chronic kidney disease.  She was transfused 2 unit prbc during this hospitalization.  She has no signs of ongoing bleeding and hemoglobin is stable.    She will be transferred to SNF today for physical therapy.  Procedures: intramedullary nail right hip fracture with gamma nail   Consultations:  Orthopedics, Dr. Aline Brochure  Discharge Exam: Filed Vitals:   07/21/12 2246 07/22/12 0506 07/22/12 0757 07/22/12 1129  BP:  146/72    Pulse:  62    Temp: 99.1 F (37.3 C) 98.5 F (36.9 C)    TempSrc: Oral  Oral    Resp:  20 16 20   Height:      Weight:      SpO2:  100% 95% 95%    General: NAD Cardiovascular: S1, s2, rrr Respiratory: cta B  Discharge Instructions  Discharge Orders   Future Orders Complete By Expires     Diet - low sodium heart healthy  As directed     Increase activity slowly  As directed         Medication List    TAKE these medications       amLODipine 5 MG tablet  Commonly known as:  NORVASC  Take 5 mg by mouth daily.     aspirin 325 MG EC tablet  Take 325 mg by mouth daily.     ciprofloxacin 500  MG tablet  Commonly known as:  CIPRO  Take 1 tablet (500 mg total) by mouth 2 (two) times daily. Until 6/1     enoxaparin 30 MG/0.3ML injection  Commonly known as:  LOVENOX  Inject 0.3 mLs (30 mg total) into the skin daily. Until 6/25     Fish Oil 1000 MG Caps  Take 1,000 mg by mouth 2 (two) times daily.     furosemide 20 MG tablet  Commonly known as:  LASIX  Take 20 mg by mouth daily.     glimepiride 2 MG tablet  Commonly known as:  AMARYL  Take 2 mg by mouth daily.     hydrALAZINE 25 MG tablet  Commonly known as:  APRESOLINE  Take 1 tablet (25 mg total) by mouth every 8 (eight) hours.     HYDROcodone-acetaminophen 5-325 MG per tablet  Commonly known as:  NORCO/VICODIN  Take 1 tablet by mouth every 4 (four) hours as needed.     labetalol 100 MG tablet  Commonly known as:  NORMODYNE  Take 100 mg by mouth 2 (two) times daily.     loperamide 2 MG capsule  Commonly known as:  IMODIUM  Take 1 capsule (2 mg total) by mouth 4 (four) times daily as needed for diarrhea or loose stools.       No Known Allergies    The results of significant diagnostics from this hospitalization (including imaging, microbiology, ancillary and laboratory) are listed below for reference.    Significant Diagnostic Studies: Dg Chest 1 View  07/17/2012   *RADIOLOGY REPORT*  Clinical Data: Right hip fracture from a fall.  CHEST - 1 VIEW  Comparison: None.  Findings: Poor inspiration.  Mildly enlarged cardiac silhouette with prominent pulmonary vasculature.  Mildly prominent interstitial markings.  The patient's chin is overlying the upper right lung apex.  Thoracic spine degenerative changes.  Diffuse osteopenia.  IMPRESSION:  1.  Pulmonary vascular congestion and mild cardiomegaly. 2.  Mild chronic interstitial lung disease.   Original Report Authenticated By: Claudie Revering, M.D.   Dg Hip Complete Right  07/17/2012   *RADIOLOGY REPORT*  Clinical Data: Right hip pain following a fall.  RIGHT HIP -  COMPLETE 2+ VIEW  Comparison: None.  Findings: Right upper intertrochanteric fracture with mild impaction and mild varus angulation.  Right femoral head and neck junction spur formation.  Diffuse osteopenia.  Right pelvic surgical clips.  IMPRESSION:  1.  Right intertrochanteric fracture, as described above. 2.  Mild to moderate right hip degenerative changes.   Original Report Authenticated By: Claudie Revering, M.D.   Dg Hip Operative Right  07/20/2012   *RADIOLOGY REPORT*  Clinical Data: Hip fracture  DG OPERATIVE RIGHT  HIP  Comparison: None.  Findings: Multiple intraoperative spot images demonstrate placement of a dynamic compression screw and intramedullary rod transfixing an intertrochanteric fracture.  Anatomic alignment.  No breakage or loosening of the hardware.  IMPRESSION: ORIF right intertrochanteric femur fracture.   Original Report Authenticated By: Marybelle Killings, M.D.   US Renal  07/18/2012   *RADIOLOGY REPORT*  Clinical Data: Renal failure, evaluate for hydronephrosis  RENAL/URINARY TRACT ULTRASOUND COMPLETE  Comparison:  None.  Findings:  Right Kidney:  Normal cortical thickness and size, measuring 10.3 cm in length. There is mild diffuse increase echogenicity of the renal parenchymal echotexture.  No focal renal lesions.  No echogenic renal stones.  No urinary obstruction.  Left Kidney:  Normal cortical thickness and size, measuring 8.5 cm in length. There is mild diffuse increased echogenicity of the renal parenchymal echotexture.  No focal renal lesions.  No echogenic renal stones.  No urinary obstruction.  Bladder:  Decompressed with a Foley catheter.  IMPRESSION: Mildly increased echogenicity of the renal parenchymal echotexture suggestive of medical renal disease.  No discrete renal lesions or evidence of urinary obstruction.   Original Report Authenticated By: Jake Seats, MD   US Carotid Duplex Bilateral  07/18/2012   *RADIOLOGY REPORT*  Clinical Data: Stroke, hypertension, history of  diabetes  BILATERAL CAROTID DUPLEX ULTRASOUND  Technique: Gray scale imaging, color Doppler and duplex ultrasound was performed of bilateral carotid and vertebral arteries in the neck.  Comparison:  None.  Criteria:  Quantification of carotid stenosis is based on velocity parameters that correlate the residual internal carotid diameter with NASCET-based stenosis levels, using the diameter of the distal internal carotid lumen as the denominator for stenosis measurement.  The following velocity measurements were obtained:                   PEAK SYSTOLIC/END DIASTOLIC RIGHT ICA:                        76/17cm/sec CCA:                        [id number] SYSTOLIC ICA/CCA RATIO:     [id number] DIASTOLIC ICA/CCA RATIO:    1.91 ECA:                        119cm/sec  LEFT ICA:                        80/21cm/sec CCA:                        [id number] SYSTOLIC ICA/CCA RATIO:     [id number] DIASTOLIC ICA/CCA RATIO:    1.9 ECA:                        93cm/sec  Findings:  RIGHT CAROTID ARTERY: There is a very minimal amount of intimal Otterson thickening within the right carotid bulb (image 17), not resulting elevated peak systolic velocities within the right internal carotid artery to suggest a hemodynamically significant stenosis.  RIGHT VERTEBRAL ARTERY:  Antegrade flow  LEFT CAROTID ARTERY: There is a minimal amount of intimal Buschman thickening within the left carotid bulb (images 49 and 51), not resulting in elevated peak systolic velocities within the left internal carotid artery to suggest a hemodynamically significant stenosis.  LEFT VERTEBRAL ARTERY:  Antegrade flow  Incidental note  is made of a sub centimeter (approximately 4 mm) anechoic nodule within the right lobe of the thyroid (image 2).  IMPRESSION:  1.  Minimal amount of bilateral intimal Kommer thickening, not resulting in hemodynamically significant stenosis. 2.  Incidental note made of a sub centimeter anechoic nodule within the right lobe of the thyroid.  Further evaluation with  dedicated thyroid ultrasound may be obtained as clinically indicated.   Original Report Authenticated By: Jake Seats, MD    Microbiology: Recent Results (from the past 240 hour(s))  URINE CULTURE     Status: None   Collection Time    07/17/12  5:51 PM      Result Value Range Status   Specimen Description URINE, CATHETERIZED   Final   Special Requests NONE   Final   Culture  Setup Time 07/17/2012 21:33   Final   Colony Count >=100,000 COLONIES/ML   Final   Culture KLEBSIELLA PNEUMONIAE   Final   Report Status 07/20/2012 FINAL   Final   Organism ID, Bacteria KLEBSIELLA PNEUMONIAE   Final  MRSA PCR SCREENING     Status: None   Collection Time    07/19/12 11:28 PM      Result Value Range Status   MRSA by PCR NEGATIVE  NEGATIVE Final   Comment:            The GeneXpert MRSA Assay (FDA     approved for NASAL specimens     only), is one component of a     comprehensive MRSA colonization     surveillance program. It is not     intended to diagnose MRSA     infection nor to guide or     monitor treatment for     MRSA infections.  SURGICAL PCR SCREEN     Status: None   Collection Time    07/20/12  7:51 AM      Result Value Range Status   MRSA, PCR NEGATIVE  NEGATIVE Final   Staphylococcus aureus NEGATIVE  NEGATIVE Final   Comment:            The Xpert SA Assay (FDA     approved for NASAL specimens     in patients over 79 years of age),     is one component of     a comprehensive surveillance     program.  Test performance has     been validated by [employer] for patients greater     than or equal to 46 year old.     It is not intended     to diagnose infection nor to     guide or monitor treatment.  CLOSTRIDIUM DIFFICILE BY PCR     Status: None   Collection Time    07/21/12  9:08 PM      Result Value Range Status   C difficile by pcr NEGATIVE  NEGATIVE Final     Labs: Basic Metabolic Panel:  Recent Labs Lab 07/17/12 1550 07/18/12 0633 07/19/12 0619  07/21/12 0601 07/21/12 1639 07/22/12 0604  NA 140 139 141 136  --  141  K 3.4* 3.6 4.9 6.3* 4.7 3.9  CL 102 106 111 109  --  109  CO2 27 24 23 21   --  22  GLUCOSE 211* 106* 109* 136*  --  63*  BUN 20 17 16 14   --  15  CREATININE 1.75* 1.73* 1.78* 1.62*  --  1.69*  CALCIUM 9.2 8.1* 8.4 8.3*  --  8.2*   Liver Function Tests:  Recent Labs Lab 07/17/12 1550 07/18/12 0633  AST 22 17  ALT 18 14  ALKPHOS 111 94  BILITOT 0.6 0.6  PROT 7.2 5.8*  ALBUMIN 3.4* 2.9*   No results found for this basename: LIPASE, AMYLASE,  in the last 168 hours No results found for this basename: AMMONIA,  in the last 168 hours CBC:  Recent Labs Lab 07/17/12 1550 07/18/12 0633 07/19/12 0619 07/21/12 0601 07/22/12 0604  WBC 6.8 5.1 6.4 5.6 5.6  HGB 10.3* 9.1* 12.0 11.1* 10.5*  HCT 29.6* 26.5* 35.8* 32.1* 30.4*  MCV 83.4 84.1 85.2 85.6 84.7  PLT 193 178 170 163 185   Cardiac Enzymes:  Recent Labs Lab 07/17/12 1850 07/17/12 2316 07/18/12 0633  TROPONINI <0.30 <0.30 <0.30   BNP: BNP (last 3 results) No results found for this basename: PROBNP,  in the last 8760 hours CBG:  Recent Labs Lab 07/21/12 1650 07/21/12 2111 07/22/12 0743 07/22/12 0833 07/22/12 1201  GLUCAP 142* 159* 63* 108* 123*       Signed:  MEMON,JEHANZEB  [facility] 07/22/2012, 1:56 PM

## 2012-07-22 NOTE — Progress Notes (Signed)
Report called to Lowry Ram, RN at [facility]. Caregiver verbalized understanding of instructions. Pt is a/o.vss. Up with assist x1. Saline lock removed. Dressing to right hip clean and intact. Pt is Awaiting for transport to go to facility.

## 2012-07-22 NOTE — Progress Notes (Signed)
Patient was having multiple watery  bowel movements all day. C. Diff protocol was put in place to test. Patient came back negative.

## 2012-07-25 ENCOUNTER — Encounter (HOSPITAL_COMMUNITY): Payer: Self-pay | Admitting: Orthopedic Surgery

## 2012-07-25 NOTE — Progress Notes (Signed)
UR chart review completed.  

## 2012-08-16 ENCOUNTER — Encounter: Payer: Self-pay | Admitting: Orthopedic Surgery

## 2012-08-16 ENCOUNTER — Ambulatory Visit (INDEPENDENT_AMBULATORY_CARE_PROVIDER_SITE_OTHER): Payer: Medicare Other | Admitting: Orthopedic Surgery

## 2012-08-16 ENCOUNTER — Ambulatory Visit (INDEPENDENT_AMBULATORY_CARE_PROVIDER_SITE_OTHER): Payer: Medicare Other

## 2012-08-16 VITALS — BP 130/70 | Ht 62.0 in | Wt 100.0 lb

## 2012-08-16 DIAGNOSIS — S72141A Displaced intertrochanteric fracture of right femur, initial encounter for closed fracture: Secondary | ICD-10-CM | POA: Insufficient documentation

## 2012-08-16 DIAGNOSIS — S72009D Fracture of unspecified part of neck of unspecified femur, subsequent encounter for closed fracture with routine healing: Secondary | ICD-10-CM

## 2012-08-16 DIAGNOSIS — S72141D Displaced intertrochanteric fracture of right femur, subsequent encounter for closed fracture with routine healing: Secondary | ICD-10-CM

## 2012-08-16 NOTE — Progress Notes (Signed)
Patient ID: Courtney [name], female   DOB: 07/10/1940, 72 y.o.   MRN: [id number] Chief Complaint  Patient presents with  . Follow-up    follow up right hip fracture DOS 07/20/12   xrays look good  F/u 8 weeks for repeat xrays

## 2012-08-16 NOTE — Patient Instructions (Addendum)
Orthopaedic Instructions for Gamma Nail  Right  hip fracture   S/P gamma nail   Weight bearing as tolerated   Lovenox stop if on this med

## 2012-10-11 ENCOUNTER — Ambulatory Visit (INDEPENDENT_AMBULATORY_CARE_PROVIDER_SITE_OTHER): Payer: Medicare Other | Admitting: Orthopedic Surgery

## 2012-10-11 ENCOUNTER — Ambulatory Visit (INDEPENDENT_AMBULATORY_CARE_PROVIDER_SITE_OTHER): Payer: Medicare Other

## 2012-10-11 VITALS — BP 126/68 | Ht 62.0 in | Wt 100.0 lb

## 2012-10-11 DIAGNOSIS — S72141D Displaced intertrochanteric fracture of right femur, subsequent encounter for closed fracture with routine healing: Secondary | ICD-10-CM

## 2012-10-11 DIAGNOSIS — S72009D Fracture of unspecified part of neck of unspecified femur, subsequent encounter for closed fracture with routine healing: Secondary | ICD-10-CM

## 2012-10-11 NOTE — Patient Instructions (Addendum)
activities as tolerated 

## 2012-10-11 NOTE — Progress Notes (Signed)
Patient ID: Courtney [name], female   DOB: 10/04/1940, 72 y.o.   MRN: [id number]  Chief Complaint  Patient presents with  . Follow-up     follow up for xrays right hip short gamma nail . DOS 07/20/12    BP 126/68  Ht 5\' 2"  (1.575 m)  Wt 100 lb (45.36 kg)  BMI 18.29 kg/m2  Postop gamma nail right hip approximately 12 weeks doing well x-rays show fracture healing implant in good configuration clinical exam is normal  Recommend three-month followup no x-ray needed.

## 2013-01-12 ENCOUNTER — Encounter: Payer: Self-pay | Admitting: Orthopedic Surgery

## 2013-01-12 ENCOUNTER — Ambulatory Visit (INDEPENDENT_AMBULATORY_CARE_PROVIDER_SITE_OTHER): Payer: Medicare Other | Admitting: Orthopedic Surgery

## 2013-01-12 VITALS — BP 132/78 | Ht 62.0 in | Wt 100.0 lb

## 2013-01-12 DIAGNOSIS — S72009D Fracture of unspecified part of neck of unspecified femur, subsequent encounter for closed fracture with routine healing: Secondary | ICD-10-CM

## 2013-01-12 DIAGNOSIS — S72141D Displaced intertrochanteric fracture of right femur, subsequent encounter for closed fracture with routine healing: Secondary | ICD-10-CM

## 2013-01-12 NOTE — Progress Notes (Signed)
Patient ID: Courtney [name], female   DOB: 17-Jul-1940, 72 y.o.   MRN: [id number]  Chief Complaint  Patient presents with  . Follow-up    3 month recheck on right hip surgery. DOS 07-20-12.    6 month assessment patient is short gamma nail placed in her right intertrochanteric fracture she is here for a routine followup visit. Last x-ray show complete fracture healing.  The patient says she is doing well she is "not going to rush it "  Leg flexion normal no pain leg lengths equal  Return as needed

## 2013-06-15 ENCOUNTER — Encounter (HOSPITAL_COMMUNITY): Payer: Self-pay | Admitting: Pharmacy Technician

## 2013-06-20 NOTE — Patient Instructions (Addendum)
Your procedure is scheduled on:  06/29/2013  Report to [facility] at   7:00   AM.  Call this number if you have problems the morning of surgery: [phone]   Remember:   Do not eat or drink :After Midnight.    Take these medicines the morning of surgery with A SIP OF WATER:Amlodipine and Hydralazine and labetolol   Do not wear jewelry, make-up or nail polish.  Do not wear lotions, powders, or perfumes. You may wear deodorant.  Do not shave 48 hours prior to surgery.  Do not bring valuables to the hospital.  Contacts, dentures or bridgework may not be worn into surgery.  Patients discharged the day of surgery will not be allowed to drive home.  Name and phone number of your driver:    Please read over the following fact sheets that you were given: Pain Booklet, Surgical Site Infection Prevention, Anesthesia Post-op Instructions and Care and Recovery After Surgery  Cataract Surgery  A cataract is a clouding of the lens of the eye. When a lens becomes cloudy, vision is reduced based on the degree and nature of the clouding. Surgery may be needed to improve vision. Surgery removes the cloudy lens and usually replaces it with a substitute lens (intraocular lens, IOL). LET YOUR EYE DOCTOR KNOW ABOUT:  Allergies to food or medicine.   Medicines taken including herbs, eyedrops, over-the-counter medicines, and creams.   Use of steroids (by mouth or creams).   Previous problems with anesthetics or numbing medicine.   History of bleeding problems or blood clots.   Previous surgery.   Other health problems, including diabetes and kidney problems.   Possibility of pregnancy, if this applies.  RISKS AND COMPLICATIONS  Infection.   Inflammation of the eyeball (endophthalmitis) that can spread to both eyes (sympathetic ophthalmia).   Poor wound healing.   If an IOL is inserted, it can later fall out of proper position. This is very uncommon.   Clouding of the part of your eye that  holds an IOL in place. This is called an "after-cataract." These are uncommon, but easily treated.  BEFORE THE PROCEDURE  Do not eat or drink anything except small amounts of water for 8 to 12 before your surgery, or as directed by your caregiver.   Unless you are told otherwise, continue any eyedrops you have been prescribed.   Talk to your primary caregiver about all other medicines that you take (both prescription and non-prescription). In some cases, you may need to stop or change medicines near the time of your surgery. This is most important if you are taking blood-thinning medicine.Do not stop medicines unless you are told to do so.   Arrange for someone to drive you to and from the procedure.   Do not put contact lenses in either eye on the day of your surgery.  PROCEDURE There is more than one method for safely removing a cataract. Your doctor can explain the differences and help determine which is best for you. Phacoemulsification surgery is the most common form of cataract surgery.  An injection is given behind the eye or eyedrops are given to make this a painless procedure.   A small cut (incision) is made on the edge of the clear, dome-shaped surface that covers the front of the eye (cornea).   A tiny probe is painlessly inserted into the eye. This device gives off ultrasound waves that soften and break up the cloudy center of the lens. This makes  it easier for the cloudy lens to be removed by suction.   An IOL may be implanted.   The normal lens of the eye is covered by a clear capsule. Part of that capsule is intentionally left in the eye to support the IOL.   Your surgeon may or may not use stitches to close the incision.  There are other forms of cataract surgery that require a larger incision and stiches to close the eye. This approach is taken in cases where the doctor feels that the cataract cannot be easily removed using phacoemulsification. AFTER THE  PROCEDURE  When an IOL is implanted, it does not need care. It becomes a permanent part of your eye and cannot be seen or felt.   Your doctor will schedule follow-up exams to check on your progress.   Review your other medicines with your doctor to see which can be resumed after surgery.   Use eyedrops or take medicine as prescribed by your doctor.  Document Released: 01/29/2011 Document Reviewed: 01/26/2011 [facility] Patient Information 2012 [place].  .Cataract Surgery Care After Refer to this sheet in the next few weeks. These instructions provide you with information on caring for yourself after your procedure. Your caregiver may also give you more specific instructions. Your treatment has been planned according to current medical practices, but problems sometimes occur. Call your caregiver if you have any problems or questions after your procedure.  HOME CARE INSTRUCTIONS   Avoid strenuous activities as directed by your caregiver.   Ask your caregiver when you can resume driving.   Use eyedrops or other medicines to help healing and control pressure inside your eye as directed by your caregiver.   Only take over-the-counter or prescription medicines for pain, discomfort, or fever as directed by your caregiver.   Do not to touch or rub your eyes.   You may be instructed to use a protective shield during the first few days and nights after surgery. If not, wear sunglasses to protect your eyes. This is to protect the eye from pressure or from being accidentally bumped.   Keep the area around your eye clean and dry. Avoid swimming or allowing water to hit you directly in the face while showering. Keep soap and shampoo out of your eyes.   Do not bend or lift heavy objects. Bending increases pressure in the eye. You can walk, climb stairs, and do light household chores.   Do not put a contact lens into the eye that had surgery until your caregiver says it is okay to do so.    Ask your doctor when you can return to work. This will depend on the kind of work that you do. If you work in a dusty environment, you may be advised to wear protective eyewear for a period of time.   Ask your caregiver when it will be safe to engage in sexual activity.   Continue with your regular eye exams as directed by your caregiver.  What to expect:  It is normal to feel itching and mild discomfort for a few days after cataract surgery. Some fluid discharge is also common, and your eye may be sensitive to light and touch.   After 1 to 2 days, even moderate discomfort should disappear. In most cases, healing will take about 6 weeks.   If you received an intraocular lens (IOL), you may notice that colors are very bright or have a blue tinge. Also, if you have been in bright sunlight,  everything may appear reddish for a few hours. If you see these color tinges, it is because your lens is clear and no longer cloudy. Within a few months after receiving an IOL, these extra colors should go away. When you have healed, you will probably need new glasses.  SEEK MEDICAL CARE IF:   You have increased bruising around your eye.   You have discomfort not helped by medicine.  SEEK IMMEDIATE MEDICAL CARE IF:   You have a fever.   You have a worsening or sudden vision loss.   You have redness, swelling, or increasing pain in the eye.   You have a thick discharge from the eye that had surgery.  MAKE SURE YOU:  Understand these instructions.   Will watch your condition.   Will get help right away if you are not doing well or get worse.  Document Released: 08/29/2004 Document Revised: 01/29/2011 Document Reviewed: 10/03/2010 [facility] Patient Information 2012 [place].

## 2013-06-21 ENCOUNTER — Encounter (HOSPITAL_COMMUNITY): Payer: Self-pay

## 2013-06-21 ENCOUNTER — Encounter (HOSPITAL_COMMUNITY)
Admission: RE | Admit: 2013-06-21 | Discharge: 2013-06-21 | Disposition: A | Discharge status: Home / Self Care | Payer: Medicare Other | Source: Ambulatory Visit | Attending: Ophthalmology | Admitting: Ophthalmology

## 2013-06-21 DIAGNOSIS — Z01810 Encounter for preprocedural cardiovascular examination: Secondary | ICD-10-CM | POA: Insufficient documentation

## 2013-06-21 DIAGNOSIS — Z01812 Encounter for preprocedural laboratory examination: Secondary | ICD-10-CM | POA: Insufficient documentation

## 2013-06-21 HISTORY — DX: Cerebral infarction, unspecified: I63.9

## 2013-06-21 LAB — BASIC METABOLIC PANEL
BUN: 30 mg/dL — AB (ref 6–23)
CALCIUM: 9 mg/dL (ref 8.4–10.5)
CO2: 19 meq/L (ref 19–32)
CREATININE: 2.01 mg/dL — AB (ref 0.50–1.10)
Chloride: 105 mEq/L (ref 96–112)
GFR calc Af Amer: 28 mL/min — ABNORMAL LOW (ref >90)
GFR calc non Af Amer: 24 mL/min — ABNORMAL LOW (ref >90)
Glucose, Bld: 226 mg/dL — ABNORMAL HIGH (ref 70–99)
Potassium: 4.5 mEq/L (ref 3.7–5.3)
Sodium: 140 mEq/L (ref 137–147)

## 2013-06-21 LAB — HEMOGLOBIN AND HEMATOCRIT, BLOOD
HEMATOCRIT: 32.4 % — AB (ref 36.0–46.0)
HEMOGLOBIN: 11 g/dL — AB (ref 12.0–15.0)

## 2013-06-21 NOTE — Pre-Procedure Instructions (Signed)
Patient given information to sign up for my chart at home. 

## 2013-06-29 ENCOUNTER — Encounter (HOSPITAL_COMMUNITY): Payer: Self-pay | Admitting: *Deleted

## 2013-06-29 ENCOUNTER — Ambulatory Visit (HOSPITAL_COMMUNITY)
Admission: RE | Admit: 2013-06-29 | Discharge: 2013-06-29 | Disposition: A | Discharge status: Home / Self Care | Payer: Medicare Other | Source: Ambulatory Visit | Attending: Ophthalmology | Admitting: Ophthalmology

## 2013-06-29 ENCOUNTER — Ambulatory Visit (HOSPITAL_COMMUNITY): Payer: Medicare Other | Admitting: Anesthesiology

## 2013-06-29 ENCOUNTER — Encounter (HOSPITAL_COMMUNITY): Payer: Medicare Other | Admitting: Anesthesiology

## 2013-06-29 ENCOUNTER — Encounter (HOSPITAL_COMMUNITY)
Admission: RE | Disposition: A | Discharge status: Home / Self Care | Payer: Self-pay | Source: Ambulatory Visit | Attending: Ophthalmology

## 2013-06-29 DIAGNOSIS — E119 Type 2 diabetes mellitus without complications: Secondary | ICD-10-CM | POA: Insufficient documentation

## 2013-06-29 DIAGNOSIS — Z79899 Other long term (current) drug therapy: Secondary | ICD-10-CM | POA: Insufficient documentation

## 2013-06-29 DIAGNOSIS — H269 Unspecified cataract: Secondary | ICD-10-CM | POA: Insufficient documentation

## 2013-06-29 DIAGNOSIS — Z7982 Long term (current) use of aspirin: Secondary | ICD-10-CM | POA: Insufficient documentation

## 2013-06-29 DIAGNOSIS — I10 Essential (primary) hypertension: Secondary | ICD-10-CM | POA: Insufficient documentation

## 2013-06-29 HISTORY — PX: CATARACT EXTRACTION W/PHACO: SHX586

## 2013-06-29 LAB — GLUCOSE, CAPILLARY: Glucose-Capillary: 146 mg/dL — ABNORMAL HIGH (ref 70–99)

## 2013-06-29 SURGERY — PHACOEMULSIFICATION, CATARACT, WITH IOL INSERTION
Anesthesia: Monitor Anesthesia Care | Site: Eye | Laterality: Left

## 2013-06-29 MED ORDER — LIDOCAINE HCL 3.5 % OP GEL
1.0000 "application " | Freq: Once | OPHTHALMIC | Status: AC
  Administered 2013-06-29: 1 via OPHTHALMIC

## 2013-06-29 MED ORDER — LIDOCAINE HCL 3.5 % OP GEL
OPHTHALMIC | Status: AC
  Filled 2013-06-29: qty 1

## 2013-06-29 MED ORDER — BSS IO SOLN
INTRAOCULAR | Status: DC | PRN
  Administered 2013-06-29: 15 mL via INTRAOCULAR

## 2013-06-29 MED ORDER — POVIDONE-IODINE 5 % OP SOLN
OPHTHALMIC | Status: DC | PRN
  Administered 2013-06-29: 1 via OPHTHALMIC

## 2013-06-29 MED ORDER — PHENYLEPHRINE HCL 2.5 % OP SOLN
OPHTHALMIC | Status: AC
  Filled 2013-06-29: qty 15

## 2013-06-29 MED ORDER — EPINEPHRINE HCL 1 MG/ML IJ SOLN
INTRAMUSCULAR | Status: AC
  Filled 2013-06-29: qty 2

## 2013-06-29 MED ORDER — EPINEPHRINE HCL 1 MG/ML IJ SOLN
INTRAMUSCULAR | Status: DC | PRN
  Administered 2013-06-29: 09:00:00 via OPHTHALMIC

## 2013-06-29 MED ORDER — MIDAZOLAM HCL 2 MG/2ML IJ SOLN
INTRAMUSCULAR | Status: AC
  Filled 2013-06-29: qty 2

## 2013-06-29 MED ORDER — TETRACAINE HCL 0.5 % OP SOLN
1.0000 [drp] | OPHTHALMIC | Status: AC
  Administered 2013-06-29 (×3): 1 [drp] via OPHTHALMIC

## 2013-06-29 MED ORDER — CYCLOPENTOLATE-PHENYLEPHRINE OP SOLN OPTIME - NO CHARGE
OPHTHALMIC | Status: AC
Start: 2013-06-29 — End: 2013-06-29
  Filled 2013-06-29: qty 2

## 2013-06-29 MED ORDER — PHENYLEPHRINE HCL 2.5 % OP SOLN
1.0000 [drp] | OPHTHALMIC | Status: AC
  Administered 2013-06-29 (×3): 1 [drp] via OPHTHALMIC

## 2013-06-29 MED ORDER — PROVISC 10 MG/ML IO SOLN
INTRAOCULAR | Status: DC | PRN
  Administered 2013-06-29: 0.85 mL via INTRAOCULAR

## 2013-06-29 MED ORDER — LIDOCAINE HCL (PF) 1 % IJ SOLN
INTRAMUSCULAR | Status: AC
  Filled 2013-06-29: qty 2

## 2013-06-29 MED ORDER — NEOMYCIN-POLYMYXIN-DEXAMETH 3.5-10000-0.1 OP SUSP
OPHTHALMIC | Status: AC
  Filled 2013-06-29: qty 5

## 2013-06-29 MED ORDER — LACTATED RINGERS IV SOLN
INTRAVENOUS | Status: DC
  Administered 2013-06-29: 08:00:00 via INTRAVENOUS

## 2013-06-29 MED ORDER — MIDAZOLAM HCL 2 MG/2ML IJ SOLN
1.0000 mg | INTRAMUSCULAR | Status: DC | PRN
  Administered 2013-06-29: 2 mg via INTRAVENOUS

## 2013-06-29 MED ORDER — CYCLOPENTOLATE-PHENYLEPHRINE 0.2-1 % OP SOLN
1.0000 [drp] | OPHTHALMIC | Status: AC
  Administered 2013-06-29 (×3): 1 [drp] via OPHTHALMIC

## 2013-06-29 MED ORDER — FENTANYL CITRATE 0.05 MG/ML IJ SOLN
25.0000 ug | INTRAMUSCULAR | Status: AC
  Administered 2013-06-29: 25 ug via INTRAVENOUS

## 2013-06-29 MED ORDER — NEOMYCIN-POLYMYXIN-DEXAMETH 3.5-10000-0.1 OP SUSP
OPHTHALMIC | Status: DC | PRN
  Administered 2013-06-29: 2 [drp] via OPHTHALMIC

## 2013-06-29 MED ORDER — FENTANYL CITRATE 0.05 MG/ML IJ SOLN
INTRAMUSCULAR | Status: AC
  Filled 2013-06-29: qty 2

## 2013-06-29 MED ORDER — TETRACAINE HCL 0.5 % OP SOLN
OPHTHALMIC | Status: AC
  Filled 2013-06-29: qty 2

## 2013-06-29 MED ORDER — EPINEPHRINE HCL 1 MG/ML IJ SOLN
INTRAOCULAR | Status: DC | PRN
  Administered 2013-06-29: 09:00:00

## 2013-06-29 SURGICAL SUPPLY — 33 items
CAPSULAR TENSION RING-AMO (OPHTHALMIC RELATED) IMPLANT
CLOTH BEACON ORANGE TIMEOUT ST (SAFETY) ×3 IMPLANT
EYE SHIELD UNIVERSAL CLEAR (GAUZE/BANDAGES/DRESSINGS) ×3 IMPLANT
GLOVE BIO SURGEON STRL SZ 6.5 (GLOVE) IMPLANT
GLOVE BIO SURGEONS STRL SZ 6.5 (GLOVE)
GLOVE BIOGEL PI IND STRL 6.5 (GLOVE) IMPLANT
GLOVE BIOGEL PI IND STRL 7.0 (GLOVE) IMPLANT
GLOVE BIOGEL PI IND STRL 7.5 (GLOVE) IMPLANT
GLOVE BIOGEL PI INDICATOR 6.5 (GLOVE)
GLOVE BIOGEL PI INDICATOR 7.0 (GLOVE)
GLOVE BIOGEL PI INDICATOR 7.5 (GLOVE)
GLOVE ECLIPSE 6.5 STRL STRAW (GLOVE) IMPLANT
GLOVE ECLIPSE 7.0 STRL STRAW (GLOVE) ×6 IMPLANT
GLOVE ECLIPSE 7.5 STRL STRAW (GLOVE) IMPLANT
GLOVE EXAM NITRILE LRG STRL (GLOVE) IMPLANT
GLOVE EXAM NITRILE MD LF STRL (GLOVE) IMPLANT
GLOVE SKINSENSE NS SZ6.5 (GLOVE)
GLOVE SKINSENSE NS SZ7.0 (GLOVE)
GLOVE SKINSENSE STRL SZ6.5 (GLOVE) IMPLANT
GLOVE SKINSENSE STRL SZ7.0 (GLOVE) IMPLANT
KIT VITRECTOMY (OPHTHALMIC RELATED) IMPLANT
PAD ARMBOARD 7.5X6 YLW CONV (MISCELLANEOUS) ×3 IMPLANT
PROC W NO LENS (INTRAOCULAR LENS)
PROC W SPEC LENS (INTRAOCULAR LENS)
PROCESS W NO LENS (INTRAOCULAR LENS) IMPLANT
PROCESS W SPEC LENS (INTRAOCULAR LENS) IMPLANT
RING MALYGIN (MISCELLANEOUS) IMPLANT
SIGHTPATH CAT PROC W REG LENS (Ophthalmic Related) ×3 IMPLANT
SYR TB 1ML LL NO SAFETY (SYRINGE) ×3 IMPLANT
TAPE SURG TRANSPORE 1 IN (GAUZE/BANDAGES/DRESSINGS) ×1 IMPLANT
TAPE SURGICAL TRANSPORE 1 IN (GAUZE/BANDAGES/DRESSINGS) ×2
VISCOELASTIC ADDITIONAL (OPHTHALMIC RELATED) IMPLANT
WATER STERILE IRR 250ML POUR (IV SOLUTION) ×3 IMPLANT

## 2013-06-29 NOTE — H&P (Signed)
I have reviewed the H&P, the patient was re-examined, and I have identified no interval changes in medical condition and plan of care since the history and physical of record  

## 2013-06-29 NOTE — Discharge Instructions (Signed)

## 2013-06-29 NOTE — Anesthesia Preprocedure Evaluation (Signed)
Anesthesia Evaluation  Patient identified by MRN, date of birth, ID band Patient awake    Reviewed: Allergy & Precautions, H&P , NPO status , Patient's Chart, lab work & pertinent test results  Airway Mallampati: II TM Distance: >3 FB Neck ROM: Full    Dental  (+) Edentulous Upper, Edentulous Lower   Pulmonary neg pulmonary ROS,  breath sounds clear to auscultation        Cardiovascular hypertension, Pt. on medications Rhythm:Regular Rate:Normal  Syncopal episode - fell - Fx'd R hip. No more syncope since admitted.   Neuro/Psych CVA, Residual Symptoms    GI/Hepatic   Endo/Other  diabetes, Type 2, Oral Hypoglycemic Agents  Renal/GU      Musculoskeletal   Abdominal   Peds  Hematology   Anesthesia Other Findings   Reproductive/Obstetrics                           Anesthesia Physical Anesthesia Plan  ASA: III  Anesthesia Plan: MAC   Post-op Pain Management:    Induction:   Airway Management Planned: Nasal Cannula  Additional Equipment:   Intra-op Plan:   Post-operative Plan:   Informed Consent: I have reviewed the patients History and Physical, chart, labs and discussed the procedure including the risks, benefits and alternatives for the proposed anesthesia with the patient or authorized representative who has indicated his/her understanding and acceptance.     Plan Discussed with:   Anesthesia Plan Comments:         Anesthesia Quick Evaluation

## 2013-06-29 NOTE — Op Note (Signed)
Date of Admission: 06/29/2013  Date of Surgery: 06/29/2013   Pre-Op Dx: Cataract Left Eye  Post-Op Dx: Cataract Left  Eye,  Dx Code 366.19  Surgeon: Tonny Branch, M.D.  Assistants: None  Anesthesia: Topical with MAC  Indications: Painless, progressive loss of vision with compromise of daily activities.  Surgery: Cataract Extraction with Intraocular lens Implant Left Eye  Discription: The patient had dilating drops and viscous lidocaine placed into the Left eye in the pre-op holding area. After transfer to the operating room, a time out was performed. The patient was then prepped and draped. Beginning with a 44 degree blade a paracentesis port was made at the surgeon's 2 o'clock position. The anterior chamber was then filled with 1% non-preserved lidocaine. This was followed by filling the anterior chamber with Provisc.  A 2.99mm keratome blade was used to make a clear corneal incision at the temporal limbus.  A bent cystatome needle was used to create a continuous tear capsulotomy. Hydrodissection was performed with balanced salt solution on a Fine canula. The lens nucleus was then removed using the phacoemulsification handpiece. Residual cortex was removed with the I&A handpiece. The anterior chamber and capsular bag were refilled with Provisc. A posterior chamber intraocular lens was placed into the capsular bag with it's injector. The implant was positioned with the Kuglan hook. The Provisc was then removed from the anterior chamber and capsular bag with the I&A handpiece. Stromal hydration of the main incision and paracentesis port was performed with BSS on a Fine canula. The wounds were tested for leak which was negative. The patient tolerated the procedure well. There were no operative complications. The patient was then transferred to the recovery room in stable condition.  Complications: None  Specimen: None  EBL: None  Prosthetic device: Hoya iSert 250, power 25.5 D, SN [id number].

## 2013-06-29 NOTE — Transfer of Care (Signed)
Immediate Anesthesia Transfer of Care Note  Patient: Courtney [name]  Procedure(s) Performed: Procedure(s) with comments: CATARACT EXTRACTION PHACO AND INTRAOCULAR LENS PLACEMENT (IOC) (Left) - CDE 12.25  Patient Location: Short Stay  Anesthesia Type:MAC  Level of Consciousness: awake  Airway & Oxygen Therapy: Patient Spontanous Breathing  Post-op Assessment: Report given to PACU RN  Post vital signs: Reviewed  Complications: No apparent anesthesia complications

## 2013-06-29 NOTE — Anesthesia Postprocedure Evaluation (Signed)
  Anesthesia Post-op Note  Patient: Courtney [name]  Procedure(s) Performed: Procedure(s) with comments: CATARACT EXTRACTION PHACO AND INTRAOCULAR LENS PLACEMENT (IOC) (Left) - CDE 12.25  Patient Location: Short Stay  Anesthesia Type:MAC  Level of Consciousness: awake, alert  and oriented  Airway and Oxygen Therapy: Patient Spontanous Breathing  Post-op Pain: none  Post-op Assessment: Post-op Vital signs reviewed, Patient's Cardiovascular Status Stable, Respiratory Function Stable, Patent Airway and No signs of Nausea or vomiting  Post-op Vital Signs: Reviewed and stable  Last Vitals:  Filed Vitals:   06/29/13 0830  BP: 169/70  Temp:   Resp: 17    Complications: No apparent anesthesia complications

## 2013-06-30 ENCOUNTER — Encounter (HOSPITAL_COMMUNITY): Payer: Self-pay | Admitting: Ophthalmology

## 2013-07-12 ENCOUNTER — Encounter (HOSPITAL_COMMUNITY): Payer: Self-pay | Admitting: Pharmacy Technician

## 2013-07-18 NOTE — Patient Instructions (Signed)
Courtney [name]  07/18/2013   Your procedure is scheduled on: 07/27/2013   Report to [facility] at [place] AM.  Call this number if you have problems the morning of surgery: [phone]   Remember:   Do not eat food or drink liquids after midnight.   Take these medicines the morning of surgery with A SIP OF WATER:     Amlodipine, Labetalol   Do not wear jewelry, make-up or nail polish.  Do not wear lotions, powders, or perfumes. You may wear deodorant.  Do not shave 48 hours prior to surgery. Men may shave face and neck.  Do not bring valuables to the hospital.  [facility] is not responsible for any belongings or valuables.               Contacts, dentures or bridgework may not be worn into surgery.  Leave suitcase in the car. After surgery it may be brought to your room.  For patients admitted to the hospital, discharge time is determined by your                treatment team.               Patients discharged the day of surgery will not be allowed to drive  home.  Name and phone number of your driver:   Special Instructions: N/A   Please read over the following fact sheets that you were given: Surgical Site Infection Prevention, Anesthesia Post-op Instructions and Care and Recovery After Surgery PATIENT INSTRUCTIONS POST-ANESTHESIA  IMMEDIATELY FOLLOWING SURGERY:  Do not drive or operate machinery for the first twenty four hours after surgery.  Do not make any important decisions for twenty four hours after surgery or while taking narcotic pain medications or sedatives.  If you develop intractable nausea and vomiting or a severe headache please notify your doctor immediately.  FOLLOW-UP:  Please make an appointment with your surgeon as instructed. You do not need to follow up with anesthesia unless specifically instructed to do so.  WOUND CARE INSTRUCTIONS (if applicable):  Keep a dry clean dressing on the anesthesia/puncture wound site if there is drainage.  Once the wound has quit  draining you may leave it open to air.  Generally you should leave the bandage intact for twenty four hours unless there is drainage.  If the epidural site drains for more than 36-48 hours please call the anesthesia department.  QUESTIONS?:  Please feel free to call your physician or the hospital operator if you have any questions, and they will be happy to assist you.      Cataract Surgery Care After Refer to this sheet in the next few weeks. These instructions provide you with information on caring for yourself after your procedure. Your caregiver may also give you more specific instructions. Your treatment has been planned according to current medical practices, but problems sometimes occur. Call your caregiver if you have any problems or questions after your procedure.  HOME CARE INSTRUCTIONS   Avoid strenuous activities as directed by your caregiver.  Ask your caregiver when you can resume driving.  Use eyedrops or other medicines to help healing and control pressure inside your eye as directed by your caregiver.  Only take over-the-counter or prescription medicines for pain, discomfort, or fever as directed by your caregiver.  Do not to touch or rub your eyes.  You may be instructed to use a protective shield during the first few days and nights after surgery. If not,  wear sunglasses to protect your eyes. This is to protect the eye from pressure or from being accidentally bumped.  Keep the area around your eye clean and dry. Avoid swimming or allowing water to hit you directly in the face while showering. Keep soap and shampoo out of your eyes.  Do not bend or lift heavy objects. Bending increases pressure in the eye. You can walk, climb stairs, and do light household chores.  Do not put a contact lens into the eye that had surgery until your caregiver says it is okay to do so.  Ask your doctor when you can return to work. This will depend on the kind of work that you do. If you  work in a dusty environment, you may be advised to wear protective eyewear for a period of time.  Ask your caregiver when it will be safe to engage in sexual activity.  Continue with your regular eye exams as directed by your caregiver. What to expect:  It is normal to feel itching and mild discomfort for a few days after cataract surgery. Some fluid discharge is also common, and your eye may be sensitive to light and touch.  After 1 to 2 days, even moderate discomfort should disappear. In most cases, healing will take about 6 weeks.  If you received an intraocular lens (IOL), you may notice that colors are very bright or have a blue tinge. Also, if you have been in bright sunlight, everything may appear reddish for a few hours. If you see these color tinges, it is because your lens is clear and no longer cloudy. Within a few months after receiving an IOL, these extra colors should go away. When you have healed, you will probably need new glasses. SEEK MEDICAL CARE IF:   You have increased bruising around your eye.  You have discomfort not helped by medicine. SEEK IMMEDIATE MEDICAL CARE IF:   You have a fever.  You have a worsening or sudden vision loss.  You have redness, swelling, or increasing pain in the eye.  You have a thick discharge from the eye that had surgery. MAKE SURE YOU:  Understand these instructions.  Will watch your condition.  Will get help right away if you are not doing well or get worse. Document Released: 08/29/2004 Document Revised: 05/04/2011 Document Reviewed: 10/03/2010 [facility] Patient Information 2014 [place], [place]. Cataract A cataract is a clouding of the lens of the eye. When a lens becomes cloudy, vision is reduced based on the degree and nature of the clouding. Many cataracts reduce vision to some degree. Some cataracts make people more near-sighted as they develop. Other cataracts increase glare. Cataracts that are ignored and become worse can  sometimes look white. The white color can be seen through the pupil. CAUSES   Aging. However, cataracts may occur at any age, even in newborns.  Certain drugs.  Trauma to the eye.  Certain diseases such as diabetes.  Specific eye diseases such as chronic inflammation inside the eye or a sudden attack of a rare form of glaucoma.  Inherited or acquired medical problems. SYMPTOMS   Gradual, progressive drop in vision in the affected eye.  Severe, rapid visual loss. This most often happens when trauma is the cause. DIAGNOSIS  To detect a cataract, an eye doctor examines the lens. Cataracts are best diagnosed with an exam of the eyes with the pupils enlarged (dilated) by drops.  TREATMENT  For an early cataract, vision may improve by using different eyeglasses  or stronger lighting. If that does not help your vision, surgery is the only effective treatment. A cataract needs to be surgically removed when vision loss interferes with your everyday activities, such as driving, reading, or watching TV. A cataract may also have to be removed if it prevents examination or treatment of another eye problem. Surgery removes the cloudy lens and usually replaces it with a substitute lens (intraocular lens, IOL).  At a time when both you and your doctor agree, the cataract will be surgically removed. If you have cataracts in both eyes, only one is usually removed at a time. This allows the operated eye to heal and be out of danger from any possible problems after surgery (such as infection or poor wound healing). In rare cases, a cataract may be doing damage to your eye. In these cases, your caregiver may advise surgical removal right away. The vast majority of people who have cataract surgery have better vision afterward. HOME CARE INSTRUCTIONS  If you are not planning surgery, you may be asked to do the following:  Use different eyeglasses.  Use stronger or brighter lighting.  Ask your eye doctor about  reducing your medicine dose or changing medicines if it is thought that a medicine caused your cataract. Changing medicines does not make the cataract go away on its own.  Become familiar with your surroundings. Poor vision can lead to injury. Avoid bumping into things on the affected side. You are at a higher risk for tripping or falling.  Exercise extreme care when driving or operating machinery.  Wear sunglasses if you are sensitive to bright light or experiencing problems with glare. SEEK IMMEDIATE MEDICAL CARE IF:   You have a worsening or sudden vision loss.  You notice redness, swelling, or increasing pain in the eye.  You have a fever. Document Released: 02/09/2005 Document Revised: 05/04/2011 Document Reviewed: 10/03/2010 [facility] Patient Information 2014 [place], [place].

## 2013-07-19 ENCOUNTER — Encounter (HOSPITAL_COMMUNITY)
Admission: RE | Admit: 2013-07-19 | Discharge: 2013-07-19 | Disposition: A | Discharge status: Home / Self Care | Payer: Medicare Other | Source: Ambulatory Visit | Attending: Ophthalmology | Admitting: Ophthalmology

## 2013-07-19 MED ORDER — FENTANYL CITRATE 0.05 MG/ML IJ SOLN: 25.0000 ug | INTRAMUSCULAR | Status: DC | PRN

## 2013-07-19 MED ORDER — ONDANSETRON HCL 4 MG/2ML IJ SOLN: 4.0000 mg | Freq: Once | INTRAMUSCULAR | Status: AC | PRN

## 2013-07-26 MED ORDER — TETRACAINE HCL 0.5 % OP SOLN
OPHTHALMIC | Status: AC
  Filled 2013-07-26: qty 2

## 2013-07-26 MED ORDER — NEOMYCIN-POLYMYXIN-DEXAMETH 3.5-10000-0.1 OP SUSP
OPHTHALMIC | Status: AC
  Filled 2013-07-26: qty 5

## 2013-07-26 MED ORDER — PHENYLEPHRINE HCL 2.5 % OP SOLN
OPHTHALMIC | Status: AC
  Filled 2013-07-26: qty 15

## 2013-07-26 MED ORDER — CYCLOPENTOLATE-PHENYLEPHRINE OP SOLN OPTIME - NO CHARGE
OPHTHALMIC | Status: AC
  Filled 2013-07-26: qty 2

## 2013-07-27 ENCOUNTER — Encounter (HOSPITAL_COMMUNITY)
Admission: RE | Disposition: A | Discharge status: Home / Self Care | Payer: Self-pay | Source: Ambulatory Visit | Attending: Ophthalmology

## 2013-07-27 ENCOUNTER — Encounter (HOSPITAL_COMMUNITY): Payer: Self-pay | Admitting: *Deleted

## 2013-07-27 ENCOUNTER — Ambulatory Visit (HOSPITAL_COMMUNITY)
Admission: RE | Admit: 2013-07-27 | Discharge: 2013-07-27 | Disposition: A | Discharge status: Home / Self Care | Payer: Medicare Other | Source: Ambulatory Visit | Attending: Ophthalmology | Admitting: Ophthalmology

## 2013-07-27 ENCOUNTER — Encounter (HOSPITAL_COMMUNITY): Payer: Medicare Other | Admitting: Anesthesiology

## 2013-07-27 ENCOUNTER — Ambulatory Visit (HOSPITAL_COMMUNITY): Payer: Medicare Other | Admitting: Anesthesiology

## 2013-07-27 DIAGNOSIS — H2589 Other age-related cataract: Secondary | ICD-10-CM | POA: Insufficient documentation

## 2013-07-27 HISTORY — PX: CATARACT EXTRACTION W/PHACO: SHX586

## 2013-07-27 LAB — GLUCOSE, CAPILLARY: Glucose-Capillary: 97 mg/dL (ref 70–99)

## 2013-07-27 SURGERY — PHACOEMULSIFICATION, CATARACT, WITH IOL INSERTION
Anesthesia: Monitor Anesthesia Care | Site: Eye | Laterality: Right

## 2013-07-27 MED ORDER — CYCLOPENTOLATE-PHENYLEPHRINE 0.2-1 % OP SOLN: 1.0000 [drp] | OPHTHALMIC | Status: DC | PRN

## 2013-07-27 MED ORDER — CYCLOPENTOLATE-PHENYLEPHRINE 0.2-1 % OP SOLN
1.0000 [drp] | OPHTHALMIC | Status: AC
  Administered 2013-07-27 (×3): 1 [drp] via OPHTHALMIC

## 2013-07-27 MED ORDER — LIDOCAINE 3.5 % OP GEL OPTIME - NO CHARGE
OPHTHALMIC | Status: DC | PRN
Start: 2013-07-27 — End: 2013-07-27
  Administered 2013-07-27: 1 [drp] via OPHTHALMIC

## 2013-07-27 MED ORDER — EPINEPHRINE HCL 1 MG/ML IJ SOLN
INTRAMUSCULAR | Status: AC
  Filled 2013-07-27: qty 1

## 2013-07-27 MED ORDER — LIDOCAINE HCL (PF) 1 % IJ SOLN
INTRAOCULAR | Status: DC | PRN
  Administered 2013-07-27: 08:00:00 via OPHTHALMIC

## 2013-07-27 MED ORDER — LIDOCAINE HCL (PF) 1 % IJ SOLN
INTRAMUSCULAR | Status: AC
  Filled 2013-07-27: qty 2

## 2013-07-27 MED ORDER — NEOMYCIN-POLYMYXIN-DEXAMETH 3.5-10000-0.1 OP SUSP
OPHTHALMIC | Status: DC | PRN
  Administered 2013-07-27: 2 [drp] via OPHTHALMIC

## 2013-07-27 MED ORDER — TETRACAINE HCL 0.5 % OP SOLN
1.0000 [drp] | OPHTHALMIC | Status: AC
  Administered 2013-07-27 (×3): 1 [drp] via OPHTHALMIC

## 2013-07-27 MED ORDER — LACTATED RINGERS IV SOLN
INTRAVENOUS | Status: DC
  Administered 2013-07-27: 08:00:00 via INTRAVENOUS

## 2013-07-27 MED ORDER — PHENYLEPHRINE HCL 2.5 % OP SOLN
1.0000 [drp] | OPHTHALMIC | Status: AC
  Administered 2013-07-27 (×3): 1 [drp] via OPHTHALMIC

## 2013-07-27 MED ORDER — PROVISC 10 MG/ML IO SOLN
INTRAOCULAR | Status: DC | PRN
  Administered 2013-07-27: 0.85 mL via INTRAOCULAR

## 2013-07-27 MED ORDER — MIDAZOLAM HCL 2 MG/2ML IJ SOLN
INTRAMUSCULAR | Status: AC
  Filled 2013-07-27: qty 2

## 2013-07-27 MED ORDER — POVIDONE-IODINE 5 % OP SOLN
OPHTHALMIC | Status: DC | PRN
  Administered 2013-07-27: 1 via OPHTHALMIC

## 2013-07-27 MED ORDER — FENTANYL CITRATE 0.05 MG/ML IJ SOLN
INTRAMUSCULAR | Status: AC
  Filled 2013-07-27: qty 2

## 2013-07-27 MED ORDER — PHENYLEPHRINE HCL 2.5 % OP SOLN: 1.0000 [drp] | OPHTHALMIC | Status: DC | PRN

## 2013-07-27 MED ORDER — EPINEPHRINE HCL 1 MG/ML IJ SOLN
INTRAOCULAR | Status: DC | PRN
  Administered 2013-07-27: 08:00:00

## 2013-07-27 MED ORDER — MIDAZOLAM HCL 2 MG/2ML IJ SOLN
1.0000 mg | INTRAMUSCULAR | Status: DC | PRN
  Administered 2013-07-27: 2 mg via INTRAVENOUS

## 2013-07-27 MED ORDER — BSS IO SOLN
INTRAOCULAR | Status: DC | PRN
  Administered 2013-07-27: 15 mL via INTRAOCULAR

## 2013-07-27 MED ORDER — LIDOCAINE HCL 3.5 % OP GEL
1.0000 "application " | Freq: Once | OPHTHALMIC | Status: AC
  Administered 2013-07-27: 1 via OPHTHALMIC

## 2013-07-27 MED ORDER — TETRACAINE HCL 0.5 % OP SOLN: 1.0000 [drp] | OPHTHALMIC | Status: DC | PRN

## 2013-07-27 MED ORDER — FENTANYL CITRATE 0.05 MG/ML IJ SOLN
25.0000 ug | INTRAMUSCULAR | Status: AC
  Administered 2013-07-27 (×2): 25 ug via INTRAVENOUS

## 2013-07-27 SURGICAL SUPPLY — 33 items
CAPSULAR TENSION RING-AMO (OPHTHALMIC RELATED) IMPLANT
CLOTH BEACON ORANGE TIMEOUT ST (SAFETY) ×3 IMPLANT
EYE SHIELD UNIVERSAL CLEAR (GAUZE/BANDAGES/DRESSINGS) ×3 IMPLANT
GLOVE BIO SURGEON STRL SZ 6.5 (GLOVE) IMPLANT
GLOVE BIO SURGEONS STRL SZ 6.5 (GLOVE)
GLOVE BIOGEL PI IND STRL 6.5 (GLOVE) ×1 IMPLANT
GLOVE BIOGEL PI IND STRL 7.0 (GLOVE) ×1 IMPLANT
GLOVE BIOGEL PI IND STRL 7.5 (GLOVE) ×1 IMPLANT
GLOVE BIOGEL PI INDICATOR 6.5 (GLOVE) ×2
GLOVE BIOGEL PI INDICATOR 7.0 (GLOVE) ×2
GLOVE BIOGEL PI INDICATOR 7.5 (GLOVE) ×2
GLOVE ECLIPSE 6.5 STRL STRAW (GLOVE) IMPLANT
GLOVE ECLIPSE 7.0 STRL STRAW (GLOVE) IMPLANT
GLOVE ECLIPSE 7.5 STRL STRAW (GLOVE) IMPLANT
GLOVE EXAM NITRILE LRG STRL (GLOVE) IMPLANT
GLOVE EXAM NITRILE MD LF STRL (GLOVE) IMPLANT
GLOVE SKINSENSE NS SZ6.5 (GLOVE)
GLOVE SKINSENSE NS SZ7.0 (GLOVE)
GLOVE SKINSENSE STRL SZ6.5 (GLOVE) IMPLANT
GLOVE SKINSENSE STRL SZ7.0 (GLOVE) IMPLANT
KIT VITRECTOMY (OPHTHALMIC RELATED) IMPLANT
PAD ARMBOARD 7.5X6 YLW CONV (MISCELLANEOUS) ×3 IMPLANT
PROC W NO LENS (INTRAOCULAR LENS)
PROC W SPEC LENS (INTRAOCULAR LENS)
PROCESS W NO LENS (INTRAOCULAR LENS) IMPLANT
PROCESS W SPEC LENS (INTRAOCULAR LENS) IMPLANT
RING MALYGIN (MISCELLANEOUS) IMPLANT
SIGHTPATH CAT PROC W REG LENS (Ophthalmic Related) ×3 IMPLANT
SYR TB 1ML LL NO SAFETY (SYRINGE) ×3 IMPLANT
TAPE SURG TRANSPORE 1 IN (GAUZE/BANDAGES/DRESSINGS) ×1 IMPLANT
TAPE SURGICAL TRANSPORE 1 IN (GAUZE/BANDAGES/DRESSINGS) ×2
VISCOELASTIC ADDITIONAL (OPHTHALMIC RELATED) IMPLANT
WATER STERILE IRR 250ML POUR (IV SOLUTION) ×3 IMPLANT

## 2013-07-27 NOTE — Anesthesia Postprocedure Evaluation (Signed)
  Anesthesia Post-op Note  Patient: Courtney [name]  Procedure(s) Performed: Procedure(s) with comments: CATARACT EXTRACTION PHACO AND INTRAOCULAR LENS PLACEMENT (IOC) (Right) - CDE:7.72  Patient Location: Short Stay  Anesthesia Type:MAC  Level of Consciousness: awake, alert , oriented and patient cooperative  Airway and Oxygen Therapy: Patient Spontanous Breathing  Post-op Pain: none  Post-op Assessment: Post-op Vital signs reviewed, Patient's Cardiovascular Status Stable and Respiratory Function Stable  Post-op Vital Signs: Reviewed and stable  Last Vitals:  Filed Vitals:   07/27/13 0745  BP: 164/51  Pulse:   Temp:   Resp: 16    Complications: No apparent anesthesia complications

## 2013-07-27 NOTE — H&P (Signed)
I have reviewed the H&P, the patient was re-examined, and I have identified no interval changes in medical condition and plan of care since the history and physical of record  

## 2013-07-27 NOTE — Op Note (Signed)
Date of Admission: 07/27/2013  Date of Surgery: 07/27/2013   Pre-Op Dx: Cataract Right Eye  Post-Op Dx: Combined Cataract Right  Eye,  Dx Code 366.19  Surgeon: Tonny Branch, M.D.  Assistants: None  Anesthesia: Topical with MAC  Indications: Painless, progressive loss of vision with compromise of daily activities.  Surgery: Cataract Extraction with Intraocular lens Implant Right Eye  Discription: The patient had dilating drops and viscous lidocaine placed into the Right eye in the pre-op holding area. After transfer to the operating room, a time out was performed. The patient was then prepped and draped. Beginning with a 42 degree blade a paracentesis port was made at the surgeon's 2 o'clock position. The anterior chamber was then filled with 1% non-preserved lidocaine. This was followed by filling the anterior chamber with Provisc.  A 2.60mm keratome blade was used to make a clear corneal incision at the temporal limbus.  A bent cystatome needle was used to create a continuous tear capsulotomy. Hydrodissection was performed with balanced salt solution on a Fine canula. The lens nucleus was then removed using the phacoemulsification handpiece. Residual cortex was removed with the I&A handpiece. The anterior chamber and capsular bag were refilled with Provisc. A posterior chamber intraocular lens was placed into the capsular bag with it's injector. The implant was positioned with the Kuglan hook. The Provisc was then removed from the anterior chamber and capsular bag with the I&A handpiece. Stromal hydration of the main incision and paracentesis port was performed with BSS on a Fine canula. The wounds were tested for leak which was negative. The patient tolerated the procedure well. There were no operative complications. The patient was then transferred to the recovery room in stable condition.  Complications: None  Specimen: None  EBL: None  Prosthetic device: Hoya iSert 250, power 25.5 D, SN  [id number].

## 2013-07-27 NOTE — Anesthesia Preprocedure Evaluation (Signed)
Anesthesia Evaluation  Patient identified by MRN, date of birth, ID band Patient awake    Reviewed: Allergy & Precautions, H&P , NPO status , Patient's Chart, lab work & pertinent test results  Airway Mallampati: II TM Distance: >3 FB Neck ROM: Full    Dental  (+) Edentulous Upper, Edentulous Lower   Pulmonary neg pulmonary ROS,  breath sounds clear to auscultation        Cardiovascular hypertension, Pt. on medications Rhythm:Regular Rate:Normal  Syncopal episode - fell - Fx'd R hip. No more syncope since admitted.   Neuro/Psych CVA, Residual Symptoms    GI/Hepatic   Endo/Other  diabetes, Type 2, Oral Hypoglycemic Agents  Renal/GU      Musculoskeletal   Abdominal   Peds  Hematology   Anesthesia Other Findings   Reproductive/Obstetrics                           Anesthesia Physical Anesthesia Plan  ASA: III  Anesthesia Plan: MAC   Post-op Pain Management:    Induction:   Airway Management Planned: Nasal Cannula  Additional Equipment:   Intra-op Plan:   Post-operative Plan:   Informed Consent: I have reviewed the patients History and Physical, chart, labs and discussed the procedure including the risks, benefits and alternatives for the proposed anesthesia with the patient or authorized representative who has indicated his/her understanding and acceptance.     Plan Discussed with:   Anesthesia Plan Comments:         Anesthesia Quick Evaluation

## 2013-07-27 NOTE — Transfer of Care (Signed)
Immediate Anesthesia Transfer of Care Note  Patient: Courtney [name]  Procedure(s) Performed: Procedure(s) with comments: CATARACT EXTRACTION PHACO AND INTRAOCULAR LENS PLACEMENT (IOC) (Right) - CDE:7.72  Patient Location: Short Stay  Anesthesia Type:MAC  Level of Consciousness: awake, alert , oriented and patient cooperative  Airway & Oxygen Therapy: Patient Spontanous Breathing  Post-op Assessment: Report given to PACU RN, Post -op Vital signs reviewed and stable and Patient moving all extremities  Post vital signs: Reviewed and stable  Complications: No apparent anesthesia complications

## 2013-07-28 ENCOUNTER — Encounter (HOSPITAL_COMMUNITY): Payer: Self-pay | Admitting: Ophthalmology

## 2014-03-02 DIAGNOSIS — R069 Unspecified abnormalities of breathing: Secondary | ICD-10-CM | POA: Diagnosis not present

## 2015-04-16 DIAGNOSIS — N189 Chronic kidney disease, unspecified: Secondary | ICD-10-CM | POA: Diagnosis not present

## 2015-04-16 DIAGNOSIS — Z13220 Encounter for screening for lipoid disorders: Secondary | ICD-10-CM | POA: Diagnosis not present

## 2015-04-16 DIAGNOSIS — E1121 Type 2 diabetes mellitus with diabetic nephropathy: Secondary | ICD-10-CM | POA: Diagnosis not present

## 2015-04-16 DIAGNOSIS — I129 Hypertensive chronic kidney disease with stage 1 through stage 4 chronic kidney disease, or unspecified chronic kidney disease: Secondary | ICD-10-CM | POA: Diagnosis not present

## 2015-04-16 DIAGNOSIS — I10 Essential (primary) hypertension: Secondary | ICD-10-CM | POA: Diagnosis not present

## 2015-06-15 DIAGNOSIS — N179 Acute kidney failure, unspecified: Secondary | ICD-10-CM | POA: Diagnosis not present

## 2015-06-15 DIAGNOSIS — I252 Old myocardial infarction: Secondary | ICD-10-CM | POA: Diagnosis not present

## 2015-06-15 DIAGNOSIS — E860 Dehydration: Secondary | ICD-10-CM | POA: Diagnosis present

## 2015-06-15 DIAGNOSIS — E1122 Type 2 diabetes mellitus with diabetic chronic kidney disease: Secondary | ICD-10-CM | POA: Diagnosis present

## 2015-06-15 DIAGNOSIS — Z79899 Other long term (current) drug therapy: Secondary | ICD-10-CM | POA: Diagnosis not present

## 2015-06-15 DIAGNOSIS — M6281 Muscle weakness (generalized): Secondary | ICD-10-CM | POA: Diagnosis not present

## 2015-06-15 DIAGNOSIS — Z780 Asymptomatic menopausal state: Secondary | ICD-10-CM | POA: Diagnosis not present

## 2015-06-15 DIAGNOSIS — R262 Difficulty in walking, not elsewhere classified: Secondary | ICD-10-CM | POA: Diagnosis not present

## 2015-06-15 DIAGNOSIS — R5383 Other fatigue: Secondary | ICD-10-CM | POA: Diagnosis not present

## 2015-06-15 DIAGNOSIS — N184 Chronic kidney disease, stage 4 (severe): Secondary | ICD-10-CM | POA: Diagnosis present

## 2015-06-15 DIAGNOSIS — I69351 Hemiplegia and hemiparesis following cerebral infarction affecting right dominant side: Secondary | ICD-10-CM | POA: Diagnosis not present

## 2015-06-15 DIAGNOSIS — N189 Chronic kidney disease, unspecified: Secondary | ICD-10-CM | POA: Diagnosis not present

## 2015-06-15 DIAGNOSIS — E876 Hypokalemia: Secondary | ICD-10-CM | POA: Diagnosis not present

## 2015-06-15 DIAGNOSIS — D649 Anemia, unspecified: Secondary | ICD-10-CM | POA: Diagnosis not present

## 2015-06-15 DIAGNOSIS — Z86718 Personal history of other venous thrombosis and embolism: Secondary | ICD-10-CM | POA: Diagnosis not present

## 2015-06-15 DIAGNOSIS — Z7982 Long term (current) use of aspirin: Secondary | ICD-10-CM | POA: Diagnosis not present

## 2015-06-15 DIAGNOSIS — Z87891 Personal history of nicotine dependence: Secondary | ICD-10-CM | POA: Diagnosis not present

## 2015-06-15 DIAGNOSIS — E873 Alkalosis: Secondary | ICD-10-CM | POA: Diagnosis not present

## 2015-06-15 DIAGNOSIS — E785 Hyperlipidemia, unspecified: Secondary | ICD-10-CM | POA: Diagnosis not present

## 2015-06-15 DIAGNOSIS — B9561 Methicillin susceptible Staphylococcus aureus infection as the cause of diseases classified elsewhere: Secondary | ICD-10-CM | POA: Diagnosis present

## 2015-06-15 DIAGNOSIS — D631 Anemia in chronic kidney disease: Secondary | ICD-10-CM | POA: Diagnosis present

## 2015-06-15 DIAGNOSIS — B954 Other streptococcus as the cause of diseases classified elsewhere: Secondary | ICD-10-CM | POA: Diagnosis not present

## 2015-06-15 DIAGNOSIS — I69354 Hemiplegia and hemiparesis following cerebral infarction affecting left non-dominant side: Secondary | ICD-10-CM | POA: Diagnosis not present

## 2015-06-15 DIAGNOSIS — R2689 Other abnormalities of gait and mobility: Secondary | ICD-10-CM | POA: Diagnosis not present

## 2015-06-15 DIAGNOSIS — N390 Urinary tract infection, site not specified: Secondary | ICD-10-CM | POA: Diagnosis present

## 2015-06-15 DIAGNOSIS — N178 Other acute kidney failure: Secondary | ICD-10-CM | POA: Diagnosis not present

## 2015-06-15 DIAGNOSIS — I129 Hypertensive chronic kidney disease with stage 1 through stage 4 chronic kidney disease, or unspecified chronic kidney disease: Secondary | ICD-10-CM | POA: Diagnosis present

## 2015-06-15 DIAGNOSIS — Z91018 Allergy to other foods: Secondary | ICD-10-CM | POA: Diagnosis not present

## 2015-06-19 DIAGNOSIS — I5022 Chronic systolic (congestive) heart failure: Secondary | ICD-10-CM | POA: Diagnosis present

## 2015-06-19 DIAGNOSIS — Z681 Body mass index (BMI) 19 or less, adult: Secondary | ICD-10-CM | POA: Diagnosis not present

## 2015-06-19 DIAGNOSIS — E785 Hyperlipidemia, unspecified: Secondary | ICD-10-CM | POA: Diagnosis not present

## 2015-06-19 DIAGNOSIS — Z86718 Personal history of other venous thrombosis and embolism: Secondary | ICD-10-CM | POA: Diagnosis not present

## 2015-06-19 DIAGNOSIS — B952 Enterococcus as the cause of diseases classified elsewhere: Secondary | ICD-10-CM | POA: Diagnosis present

## 2015-06-19 DIAGNOSIS — Z79899 Other long term (current) drug therapy: Secondary | ICD-10-CM | POA: Diagnosis not present

## 2015-06-19 DIAGNOSIS — I252 Old myocardial infarction: Secondary | ICD-10-CM | POA: Diagnosis not present

## 2015-06-19 DIAGNOSIS — R4182 Altered mental status, unspecified: Secondary | ICD-10-CM | POA: Diagnosis not present

## 2015-06-19 DIAGNOSIS — R9431 Abnormal electrocardiogram [ECG] [EKG]: Secondary | ICD-10-CM | POA: Diagnosis not present

## 2015-06-19 DIAGNOSIS — I129 Hypertensive chronic kidney disease with stage 1 through stage 4 chronic kidney disease, or unspecified chronic kidney disease: Secondary | ICD-10-CM | POA: Diagnosis not present

## 2015-06-19 DIAGNOSIS — I509 Heart failure, unspecified: Secondary | ICD-10-CM | POA: Diagnosis not present

## 2015-06-19 DIAGNOSIS — M6281 Muscle weakness (generalized): Secondary | ICD-10-CM | POA: Diagnosis not present

## 2015-06-19 DIAGNOSIS — N390 Urinary tract infection, site not specified: Secondary | ICD-10-CM | POA: Diagnosis present

## 2015-06-19 DIAGNOSIS — D631 Anemia in chronic kidney disease: Secondary | ICD-10-CM | POA: Diagnosis present

## 2015-06-19 DIAGNOSIS — I130 Hypertensive heart and chronic kidney disease with heart failure and stage 1 through stage 4 chronic kidney disease, or unspecified chronic kidney disease: Secondary | ICD-10-CM | POA: Diagnosis present

## 2015-06-19 DIAGNOSIS — I69354 Hemiplegia and hemiparesis following cerebral infarction affecting left non-dominant side: Secondary | ICD-10-CM | POA: Diagnosis not present

## 2015-06-19 DIAGNOSIS — N189 Chronic kidney disease, unspecified: Secondary | ICD-10-CM | POA: Diagnosis not present

## 2015-06-19 DIAGNOSIS — I69351 Hemiplegia and hemiparesis following cerebral infarction affecting right dominant side: Secondary | ICD-10-CM | POA: Diagnosis not present

## 2015-06-19 DIAGNOSIS — D649 Anemia, unspecified: Secondary | ICD-10-CM | POA: Diagnosis not present

## 2015-06-19 DIAGNOSIS — I132 Hypertensive heart and chronic kidney disease with heart failure and with stage 5 chronic kidney disease, or end stage renal disease: Secondary | ICD-10-CM | POA: Diagnosis not present

## 2015-06-19 DIAGNOSIS — Z780 Asymptomatic menopausal state: Secondary | ICD-10-CM | POA: Diagnosis not present

## 2015-06-19 DIAGNOSIS — R531 Weakness: Secondary | ICD-10-CM | POA: Diagnosis not present

## 2015-06-19 DIAGNOSIS — R262 Difficulty in walking, not elsewhere classified: Secondary | ICD-10-CM | POA: Diagnosis not present

## 2015-06-19 DIAGNOSIS — N179 Acute kidney failure, unspecified: Secondary | ICD-10-CM | POA: Diagnosis present

## 2015-06-19 DIAGNOSIS — F4489 Other dissociative and conversion disorders: Secondary | ICD-10-CM | POA: Diagnosis not present

## 2015-06-19 DIAGNOSIS — N178 Other acute kidney failure: Secondary | ICD-10-CM | POA: Diagnosis not present

## 2015-06-19 DIAGNOSIS — B9561 Methicillin susceptible Staphylococcus aureus infection as the cause of diseases classified elsewhere: Secondary | ICD-10-CM | POA: Diagnosis not present

## 2015-06-19 DIAGNOSIS — R2689 Other abnormalities of gait and mobility: Secondary | ICD-10-CM | POA: Diagnosis not present

## 2015-06-19 DIAGNOSIS — I959 Hypotension, unspecified: Secondary | ICD-10-CM | POA: Diagnosis not present

## 2015-06-19 DIAGNOSIS — E7800 Pure hypercholesterolemia, unspecified: Secondary | ICD-10-CM | POA: Diagnosis not present

## 2015-06-19 DIAGNOSIS — R739 Hyperglycemia, unspecified: Secondary | ICD-10-CM | POA: Diagnosis not present

## 2015-06-19 DIAGNOSIS — Z7401 Bed confinement status: Secondary | ICD-10-CM | POA: Diagnosis not present

## 2015-06-19 DIAGNOSIS — Z8673 Personal history of transient ischemic attack (TIA), and cerebral infarction without residual deficits: Secondary | ICD-10-CM | POA: Diagnosis not present

## 2015-06-19 DIAGNOSIS — R402411 Glasgow coma scale score 13-15, in the field [EMT or ambulance]: Secondary | ICD-10-CM | POA: Diagnosis not present

## 2015-06-19 DIAGNOSIS — R279 Unspecified lack of coordination: Secondary | ICD-10-CM | POA: Diagnosis not present

## 2015-06-19 DIAGNOSIS — N185 Chronic kidney disease, stage 5: Secondary | ICD-10-CM | POA: Diagnosis present

## 2015-06-19 DIAGNOSIS — E1122 Type 2 diabetes mellitus with diabetic chronic kidney disease: Secondary | ICD-10-CM | POA: Diagnosis present

## 2015-06-19 DIAGNOSIS — E876 Hypokalemia: Secondary | ICD-10-CM | POA: Diagnosis present

## 2015-06-19 DIAGNOSIS — E440 Moderate protein-calorie malnutrition: Secondary | ICD-10-CM | POA: Diagnosis present

## 2015-06-19 DIAGNOSIS — E860 Dehydration: Secondary | ICD-10-CM | POA: Diagnosis not present

## 2015-06-19 DIAGNOSIS — E873 Alkalosis: Secondary | ICD-10-CM | POA: Diagnosis not present

## 2015-06-24 DIAGNOSIS — I130 Hypertensive heart and chronic kidney disease with heart failure and stage 1 through stage 4 chronic kidney disease, or unspecified chronic kidney disease: Secondary | ICD-10-CM | POA: Diagnosis not present

## 2015-06-24 DIAGNOSIS — I129 Hypertensive chronic kidney disease with stage 1 through stage 4 chronic kidney disease, or unspecified chronic kidney disease: Secondary | ICD-10-CM | POA: Diagnosis not present

## 2015-06-24 DIAGNOSIS — E1122 Type 2 diabetes mellitus with diabetic chronic kidney disease: Secondary | ICD-10-CM | POA: Diagnosis not present

## 2015-06-24 DIAGNOSIS — I509 Heart failure, unspecified: Secondary | ICD-10-CM | POA: Diagnosis not present

## 2015-06-24 DIAGNOSIS — R4182 Altered mental status, unspecified: Secondary | ICD-10-CM | POA: Diagnosis not present

## 2015-06-24 DIAGNOSIS — N189 Chronic kidney disease, unspecified: Secondary | ICD-10-CM | POA: Diagnosis not present

## 2015-07-04 DIAGNOSIS — I130 Hypertensive heart and chronic kidney disease with heart failure and stage 1 through stage 4 chronic kidney disease, or unspecified chronic kidney disease: Secondary | ICD-10-CM | POA: Diagnosis not present

## 2015-07-04 DIAGNOSIS — R531 Weakness: Secondary | ICD-10-CM | POA: Diagnosis not present

## 2015-07-04 DIAGNOSIS — Z780 Asymptomatic menopausal state: Secondary | ICD-10-CM | POA: Diagnosis not present

## 2015-07-04 DIAGNOSIS — R1312 Dysphagia, oropharyngeal phase: Secondary | ICD-10-CM | POA: Diagnosis not present

## 2015-07-04 DIAGNOSIS — B952 Enterococcus as the cause of diseases classified elsewhere: Secondary | ICD-10-CM | POA: Diagnosis present

## 2015-07-04 DIAGNOSIS — E860 Dehydration: Secondary | ICD-10-CM | POA: Diagnosis not present

## 2015-07-04 DIAGNOSIS — R41841 Cognitive communication deficit: Secondary | ICD-10-CM | POA: Diagnosis not present

## 2015-07-04 DIAGNOSIS — R2689 Other abnormalities of gait and mobility: Secondary | ICD-10-CM | POA: Diagnosis not present

## 2015-07-04 DIAGNOSIS — D631 Anemia in chronic kidney disease: Secondary | ICD-10-CM | POA: Diagnosis present

## 2015-07-04 DIAGNOSIS — N390 Urinary tract infection, site not specified: Secondary | ICD-10-CM | POA: Diagnosis present

## 2015-07-04 DIAGNOSIS — N179 Acute kidney failure, unspecified: Secondary | ICD-10-CM | POA: Diagnosis not present

## 2015-07-04 DIAGNOSIS — R739 Hyperglycemia, unspecified: Secondary | ICD-10-CM | POA: Diagnosis not present

## 2015-07-04 DIAGNOSIS — R9431 Abnormal electrocardiogram [ECG] [EKG]: Secondary | ICD-10-CM | POA: Diagnosis not present

## 2015-07-04 DIAGNOSIS — I5022 Chronic systolic (congestive) heart failure: Secondary | ICD-10-CM | POA: Diagnosis present

## 2015-07-04 DIAGNOSIS — E872 Acidosis: Secondary | ICD-10-CM | POA: Diagnosis not present

## 2015-07-04 DIAGNOSIS — N185 Chronic kidney disease, stage 5: Secondary | ICD-10-CM | POA: Diagnosis present

## 2015-07-04 DIAGNOSIS — Z743 Need for continuous supervision: Secondary | ICD-10-CM | POA: Diagnosis not present

## 2015-07-04 DIAGNOSIS — R4182 Altered mental status, unspecified: Secondary | ICD-10-CM | POA: Diagnosis not present

## 2015-07-04 DIAGNOSIS — Z681 Body mass index (BMI) 19 or less, adult: Secondary | ICD-10-CM | POA: Diagnosis not present

## 2015-07-04 DIAGNOSIS — I10 Essential (primary) hypertension: Secondary | ICD-10-CM | POA: Diagnosis not present

## 2015-07-04 DIAGNOSIS — Z86718 Personal history of other venous thrombosis and embolism: Secondary | ICD-10-CM | POA: Diagnosis not present

## 2015-07-04 DIAGNOSIS — E440 Moderate protein-calorie malnutrition: Secondary | ICD-10-CM | POA: Diagnosis present

## 2015-07-04 DIAGNOSIS — I69351 Hemiplegia and hemiparesis following cerebral infarction affecting right dominant side: Secondary | ICD-10-CM | POA: Diagnosis not present

## 2015-07-04 DIAGNOSIS — I959 Hypotension, unspecified: Secondary | ICD-10-CM | POA: Diagnosis not present

## 2015-07-04 DIAGNOSIS — D649 Anemia, unspecified: Secondary | ICD-10-CM | POA: Diagnosis not present

## 2015-07-04 DIAGNOSIS — I82409 Acute embolism and thrombosis of unspecified deep veins of unspecified lower extremity: Secondary | ICD-10-CM | POA: Diagnosis not present

## 2015-07-04 DIAGNOSIS — I69354 Hemiplegia and hemiparesis following cerebral infarction affecting left non-dominant side: Secondary | ICD-10-CM | POA: Diagnosis not present

## 2015-07-04 DIAGNOSIS — E1122 Type 2 diabetes mellitus with diabetic chronic kidney disease: Secondary | ICD-10-CM | POA: Diagnosis present

## 2015-07-04 DIAGNOSIS — E875 Hyperkalemia: Secondary | ICD-10-CM | POA: Diagnosis not present

## 2015-07-04 DIAGNOSIS — R278 Other lack of coordination: Secondary | ICD-10-CM | POA: Diagnosis not present

## 2015-07-04 DIAGNOSIS — R279 Unspecified lack of coordination: Secondary | ICD-10-CM | POA: Diagnosis not present

## 2015-07-04 DIAGNOSIS — E785 Hyperlipidemia, unspecified: Secondary | ICD-10-CM | POA: Diagnosis not present

## 2015-07-04 DIAGNOSIS — I132 Hypertensive heart and chronic kidney disease with heart failure and with stage 5 chronic kidney disease, or end stage renal disease: Secondary | ICD-10-CM | POA: Diagnosis not present

## 2015-07-04 DIAGNOSIS — N178 Other acute kidney failure: Secondary | ICD-10-CM | POA: Diagnosis not present

## 2015-07-04 DIAGNOSIS — E1129 Type 2 diabetes mellitus with other diabetic kidney complication: Secondary | ICD-10-CM | POA: Diagnosis not present

## 2015-07-04 DIAGNOSIS — N189 Chronic kidney disease, unspecified: Secondary | ICD-10-CM | POA: Diagnosis not present

## 2015-07-04 DIAGNOSIS — N184 Chronic kidney disease, stage 4 (severe): Secondary | ICD-10-CM | POA: Diagnosis not present

## 2015-07-04 DIAGNOSIS — E876 Hypokalemia: Secondary | ICD-10-CM | POA: Diagnosis present

## 2015-07-15 DIAGNOSIS — Z452 Encounter for adjustment and management of vascular access device: Secondary | ICD-10-CM | POA: Diagnosis not present

## 2015-07-15 DIAGNOSIS — R41841 Cognitive communication deficit: Secondary | ICD-10-CM | POA: Diagnosis not present

## 2015-07-15 DIAGNOSIS — Z780 Asymptomatic menopausal state: Secondary | ICD-10-CM | POA: Diagnosis not present

## 2015-07-15 DIAGNOSIS — Z01810 Encounter for preprocedural cardiovascular examination: Secondary | ICD-10-CM | POA: Diagnosis not present

## 2015-07-15 DIAGNOSIS — Z01812 Encounter for preprocedural laboratory examination: Secondary | ICD-10-CM | POA: Diagnosis not present

## 2015-07-15 DIAGNOSIS — Z681 Body mass index (BMI) 19 or less, adult: Secondary | ICD-10-CM | POA: Diagnosis not present

## 2015-07-15 DIAGNOSIS — I69351 Hemiplegia and hemiparesis following cerebral infarction affecting right dominant side: Secondary | ICD-10-CM | POA: Diagnosis not present

## 2015-07-15 DIAGNOSIS — E1122 Type 2 diabetes mellitus with diabetic chronic kidney disease: Secondary | ICD-10-CM | POA: Diagnosis not present

## 2015-07-15 DIAGNOSIS — I120 Hypertensive chronic kidney disease with stage 5 chronic kidney disease or end stage renal disease: Secondary | ICD-10-CM | POA: Diagnosis not present

## 2015-07-15 DIAGNOSIS — Z79818 Long term (current) use of other agents affecting estrogen receptors and estrogen levels: Secondary | ICD-10-CM | POA: Diagnosis not present

## 2015-07-15 DIAGNOSIS — E440 Moderate protein-calorie malnutrition: Secondary | ICD-10-CM | POA: Diagnosis not present

## 2015-07-15 DIAGNOSIS — N390 Urinary tract infection, site not specified: Secondary | ICD-10-CM | POA: Diagnosis not present

## 2015-07-15 DIAGNOSIS — D649 Anemia, unspecified: Secondary | ICD-10-CM | POA: Diagnosis not present

## 2015-07-15 DIAGNOSIS — L600 Ingrowing nail: Secondary | ICD-10-CM | POA: Diagnosis not present

## 2015-07-15 DIAGNOSIS — N186 End stage renal disease: Secondary | ICD-10-CM | POA: Diagnosis not present

## 2015-07-15 DIAGNOSIS — Z4901 Encounter for fitting and adjustment of extracorporeal dialysis catheter: Secondary | ICD-10-CM | POA: Diagnosis not present

## 2015-07-15 DIAGNOSIS — Z743 Need for continuous supervision: Secondary | ICD-10-CM | POA: Diagnosis not present

## 2015-07-15 DIAGNOSIS — H269 Unspecified cataract: Secondary | ICD-10-CM | POA: Diagnosis not present

## 2015-07-15 DIAGNOSIS — I259 Chronic ischemic heart disease, unspecified: Secondary | ICD-10-CM | POA: Diagnosis not present

## 2015-07-15 DIAGNOSIS — I69354 Hemiplegia and hemiparesis following cerebral infarction affecting left non-dominant side: Secondary | ICD-10-CM | POA: Diagnosis not present

## 2015-07-15 DIAGNOSIS — E785 Hyperlipidemia, unspecified: Secondary | ICD-10-CM | POA: Diagnosis not present

## 2015-07-15 DIAGNOSIS — N178 Other acute kidney failure: Secondary | ICD-10-CM | POA: Diagnosis not present

## 2015-07-15 DIAGNOSIS — N189 Chronic kidney disease, unspecified: Secondary | ICD-10-CM | POA: Diagnosis not present

## 2015-07-15 DIAGNOSIS — E1121 Type 2 diabetes mellitus with diabetic nephropathy: Secondary | ICD-10-CM | POA: Diagnosis not present

## 2015-07-15 DIAGNOSIS — Z7982 Long term (current) use of aspirin: Secondary | ICD-10-CM | POA: Diagnosis not present

## 2015-07-15 DIAGNOSIS — N184 Chronic kidney disease, stage 4 (severe): Secondary | ICD-10-CM | POA: Diagnosis not present

## 2015-07-15 DIAGNOSIS — Z8249 Family history of ischemic heart disease and other diseases of the circulatory system: Secondary | ICD-10-CM | POA: Diagnosis not present

## 2015-07-15 DIAGNOSIS — E875 Hyperkalemia: Secondary | ICD-10-CM | POA: Diagnosis not present

## 2015-07-15 DIAGNOSIS — Z1159 Encounter for screening for other viral diseases: Secondary | ICD-10-CM | POA: Diagnosis not present

## 2015-07-15 DIAGNOSIS — Z9842 Cataract extraction status, left eye: Secondary | ICD-10-CM | POA: Diagnosis not present

## 2015-07-15 DIAGNOSIS — E872 Acidosis: Secondary | ICD-10-CM | POA: Diagnosis not present

## 2015-07-15 DIAGNOSIS — R278 Other lack of coordination: Secondary | ICD-10-CM | POA: Diagnosis not present

## 2015-07-15 DIAGNOSIS — D638 Anemia in other chronic diseases classified elsewhere: Secondary | ICD-10-CM | POA: Diagnosis not present

## 2015-07-15 DIAGNOSIS — R1312 Dysphagia, oropharyngeal phase: Secondary | ICD-10-CM | POA: Diagnosis not present

## 2015-07-15 DIAGNOSIS — Z79899 Other long term (current) drug therapy: Secondary | ICD-10-CM | POA: Diagnosis not present

## 2015-07-15 DIAGNOSIS — Z9841 Cataract extraction status, right eye: Secondary | ICD-10-CM | POA: Diagnosis not present

## 2015-07-15 DIAGNOSIS — D509 Iron deficiency anemia, unspecified: Secondary | ICD-10-CM | POA: Diagnosis not present

## 2015-07-15 DIAGNOSIS — I5032 Chronic diastolic (congestive) heart failure: Secondary | ICD-10-CM | POA: Diagnosis not present

## 2015-07-15 DIAGNOSIS — Z86718 Personal history of other venous thrombosis and embolism: Secondary | ICD-10-CM | POA: Diagnosis not present

## 2015-07-15 DIAGNOSIS — B952 Enterococcus as the cause of diseases classified elsewhere: Secondary | ICD-10-CM | POA: Diagnosis not present

## 2015-07-15 DIAGNOSIS — Z992 Dependence on renal dialysis: Secondary | ICD-10-CM | POA: Diagnosis not present

## 2015-07-15 DIAGNOSIS — Z91018 Allergy to other foods: Secondary | ICD-10-CM | POA: Diagnosis not present

## 2015-07-15 DIAGNOSIS — Z01818 Encounter for other preprocedural examination: Secondary | ICD-10-CM | POA: Diagnosis not present

## 2015-07-15 DIAGNOSIS — D631 Anemia in chronic kidney disease: Secondary | ICD-10-CM | POA: Diagnosis not present

## 2015-07-15 DIAGNOSIS — I130 Hypertensive heart and chronic kidney disease with heart failure and stage 1 through stage 4 chronic kidney disease, or unspecified chronic kidney disease: Secondary | ICD-10-CM | POA: Diagnosis not present

## 2015-07-15 DIAGNOSIS — Z794 Long term (current) use of insulin: Secondary | ICD-10-CM | POA: Diagnosis not present

## 2015-07-15 DIAGNOSIS — I132 Hypertensive heart and chronic kidney disease with heart failure and with stage 5 chronic kidney disease, or end stage renal disease: Secondary | ICD-10-CM | POA: Diagnosis not present

## 2015-07-15 DIAGNOSIS — N185 Chronic kidney disease, stage 5: Secondary | ICD-10-CM | POA: Diagnosis not present

## 2015-07-15 DIAGNOSIS — R279 Unspecified lack of coordination: Secondary | ICD-10-CM | POA: Diagnosis not present

## 2015-07-15 DIAGNOSIS — R531 Weakness: Secondary | ICD-10-CM | POA: Diagnosis not present

## 2015-07-15 DIAGNOSIS — I10 Essential (primary) hypertension: Secondary | ICD-10-CM | POA: Diagnosis not present

## 2015-07-15 DIAGNOSIS — E1129 Type 2 diabetes mellitus with other diabetic kidney complication: Secondary | ICD-10-CM | POA: Diagnosis not present

## 2015-07-15 DIAGNOSIS — R2689 Other abnormalities of gait and mobility: Secondary | ICD-10-CM | POA: Diagnosis not present

## 2015-07-15 DIAGNOSIS — N2581 Secondary hyperparathyroidism of renal origin: Secondary | ICD-10-CM | POA: Diagnosis not present

## 2015-07-15 DIAGNOSIS — Z833 Family history of diabetes mellitus: Secondary | ICD-10-CM | POA: Diagnosis not present

## 2015-07-15 DIAGNOSIS — E876 Hypokalemia: Secondary | ICD-10-CM | POA: Diagnosis not present

## 2015-07-15 DIAGNOSIS — Z23 Encounter for immunization: Secondary | ICD-10-CM | POA: Diagnosis not present

## 2015-07-15 DIAGNOSIS — E119 Type 2 diabetes mellitus without complications: Secondary | ICD-10-CM | POA: Diagnosis not present

## 2015-07-15 DIAGNOSIS — N2889 Other specified disorders of kidney and ureter: Secondary | ICD-10-CM | POA: Diagnosis not present

## 2015-07-15 DIAGNOSIS — Z9102 Food additives allergy status: Secondary | ICD-10-CM | POA: Diagnosis not present

## 2015-07-15 DIAGNOSIS — I5022 Chronic systolic (congestive) heart failure: Secondary | ICD-10-CM | POA: Diagnosis not present

## 2015-09-03 DIAGNOSIS — L600 Ingrowing nail: Secondary | ICD-10-CM | POA: Diagnosis not present

## 2015-09-03 DIAGNOSIS — I10 Essential (primary) hypertension: Secondary | ICD-10-CM | POA: Diagnosis not present

## 2015-09-03 DIAGNOSIS — E1121 Type 2 diabetes mellitus with diabetic nephropathy: Secondary | ICD-10-CM | POA: Diagnosis not present

## 2015-09-05 DIAGNOSIS — E872 Acidosis: Secondary | ICD-10-CM | POA: Diagnosis not present

## 2015-09-05 DIAGNOSIS — N185 Chronic kidney disease, stage 5: Secondary | ICD-10-CM | POA: Diagnosis not present

## 2015-09-05 DIAGNOSIS — E875 Hyperkalemia: Secondary | ICD-10-CM | POA: Diagnosis not present

## 2015-09-05 DIAGNOSIS — D638 Anemia in other chronic diseases classified elsewhere: Secondary | ICD-10-CM | POA: Diagnosis not present

## 2015-09-10 ENCOUNTER — Ambulatory Visit (HOSPITAL_COMMUNITY)
Admission: RE | Admit: 2015-09-10 | Discharge: 2015-09-10 | Disposition: A | Discharge status: Home / Self Care | Payer: No Typology Code available for payment source | Source: Ambulatory Visit | Attending: Surgery | Admitting: Surgery

## 2015-09-10 ENCOUNTER — Ambulatory Visit (HOSPITAL_COMMUNITY): Payer: Medicare Other

## 2015-09-10 ENCOUNTER — Encounter (HOSPITAL_COMMUNITY)
Admission: RE | Admit: 2015-09-10 | Discharge: 2015-09-10 | Disposition: A | Discharge status: Home / Self Care | Payer: No Typology Code available for payment source | Source: Ambulatory Visit | Attending: Surgery | Admitting: Surgery

## 2015-09-10 ENCOUNTER — Encounter (HOSPITAL_COMMUNITY): Payer: Self-pay

## 2015-09-10 DIAGNOSIS — Z01812 Encounter for preprocedural laboratory examination: Secondary | ICD-10-CM | POA: Insufficient documentation

## 2015-09-10 DIAGNOSIS — H269 Unspecified cataract: Secondary | ICD-10-CM | POA: Diagnosis not present

## 2015-09-10 DIAGNOSIS — N186 End stage renal disease: Secondary | ICD-10-CM | POA: Diagnosis not present

## 2015-09-10 DIAGNOSIS — Z794 Long term (current) use of insulin: Secondary | ICD-10-CM | POA: Diagnosis not present

## 2015-09-10 DIAGNOSIS — E1122 Type 2 diabetes mellitus with diabetic chronic kidney disease: Secondary | ICD-10-CM | POA: Diagnosis not present

## 2015-09-10 DIAGNOSIS — E875 Hyperkalemia: Secondary | ICD-10-CM | POA: Diagnosis not present

## 2015-09-10 DIAGNOSIS — Z01811 Encounter for preprocedural respiratory examination: Secondary | ICD-10-CM

## 2015-09-10 DIAGNOSIS — Z01810 Encounter for preprocedural cardiovascular examination: Secondary | ICD-10-CM | POA: Insufficient documentation

## 2015-09-10 DIAGNOSIS — Z01818 Encounter for other preprocedural examination: Secondary | ICD-10-CM | POA: Diagnosis not present

## 2015-09-10 NOTE — Patient Instructions (Signed)
Ahja Cegielski Niebla  09/10/2015     @PREFPERIOPPHARMACY @   Your procedure is scheduled on  09/11/2015   Report to [facility] at  720  A.M.  Call this number if you have problems the morning of surgery:  [phone]   Remember:  Do not eat food or drink liquids after midnight.  Take these medicines the morning of surgery with A SIP OF WATER  Norvasc, labetolol.   Do not wear jewelry, make-up or nail polish.  Do not wear lotions, powders, or perfumes.  You may wear deoderant.  Do not shave 48 hours prior to surgery.  Men may shave face and neck.  Do not bring valuables to the hospital.  [facility] is not responsible for any belongings or valuables.  Contacts, dentures or bridgework may not be worn into surgery.  Leave your suitcase in the car.  After surgery it may be brought to your room.  For patients admitted to the hospital, discharge time will be determined by your treatment team.  Patients discharged the day of surgery will not be allowed to drive home.   Name and phone number of your driver:   family Special instructions:  none  Please read over the following fact sheets that you were given. Coughing and Deep Breathing, Surgical Site Infection Prevention, Anesthesia Post-op Instructions and Care and Recovery After Surgery      Tunneled Catheter Insertion Catheters are thin, flexible tubes that are inserted into a vein to provide access to the bloodstream. A tunneled catheter is used when a person's bloodstream needs to be accessed many times over a long period, usually longer than 30 days. The catheter provides a painless method of drawing blood, giving blood products, removing waste products from the blood (hemodialysis), and giving medicines. Tunneled catheters can be placed in different parts of the body depending on how they will be used. These catheters are secure and easy to access. A part of the catheter is tunneled under the skin. This is done to decrease the  risk of infection.  There are various types of tunneled catheters. The specific one used will depend on your needs. The catheter can be used right after insertion. LET [facility] CARE PROVIDER KNOW ABOUT:   Any allergies you have.  All medicines you are taking, including vitamins, herbs, eyedrops, and over-the-counter medicines and creams.   Previous problems you or members of your family have had with the use of anesthetics.   Any blood disorders you have had.  Possibility of pregnancy, if this applies.   Other health problems you have. Also, let your health care provider know if you have a pacemaker. RISKS AND COMPLICATIONS Generally, tunneled catheter insertion is a safe procedure. However, as with any surgical procedure, complications can occur. Possible complications include:  Damage to the blood vessel.   Bruising or bleeding at the site of puncture.   Introduction of the catheter into an artery instead of a vein.   Skin infection at the site of catheter insertion.   Bloodstream infection, especially if your white blood cell count is low.   Developing a kink in the catheter so it does not work properly.   Developing a hole or crack in the catheter.   Blockage of the catheter.   Getting air in the catheter.  Blood clots around the catheter or in the vein near the catheter.  Disturbance in the normal heart rhythm (rare). This is usually temporary.  A collapsed lung during insertion (rare).  BEFORE THE PROCEDURE   You may need to have blood tests done before the day of the procedure.   Do not eat or drink anything for at least 8 hours before the procedure or as directed by your health care provider.   Ask your health care provider about changing or stopping your regular medicines.  Avoid wearing jewelry the day of the procedure.   Make plans to have someone drive you home after the procedure. You should not drive immediately after the procedure.   PROCEDURE  You will be asked to lie on your back.  A regular intravenous (IV) access tube may be put into a vein in your hand or arm. During the procedure, medicine can flow directly into your body through the IV tube.  Small monitors will be put on your body. They are used to check your heart, blood pressure, and oxygen level.  The catheter site is usually shaved, cleaned, and covered with a sterile drape.  You will be given medicine to numb the area where the catheter will be placed (local anesthetic). You may also be given a medicine to help you relax (sedative).  The health care provider will then make a small incision in the skin, usually in the lower neck. Another small incision is made a little lower, usually on the shoulder or upper chest. Ultrasonography may be used so that the health care provider can see the vein and can properly guide the catheter placement.  X-ray equipment may also be used to help ensure that the catheter is inserted safely and is placed where it will function most effectively. This equipment allows the health care provider to watch the catheter on a live display while guiding it into place.  Generally, a small guidewire is put into the vein first. A tunnel is created under the skin. The health care provider guides the movement of the guidewire into a larger vein closer to the heart.  The catheter is pulled through the tunnel and then moved into the larger vein. The cuff on the catheter is located in the tunnel part. The cuff helps to anchor the catheter in place over time. Stitches are used to keep the catheter in place when first put in.  An X-ray may be done to make sure the catheter is in the right place. AFTER THE PROCEDURE   You may stay in a recovery area until the sedation has worn off.  Your heart rate, blood pressure and oxygen level will be monitored.  You may have some pain and swelling in the neck or shoulder. You will likely be given medicine  to control this.  If this was done as an outpatient procedure, you may be able to go home the same day.   This information is not intended to replace advice given to you by your health care provider. Make sure you discuss any questions you have with your health care provider.   Document Released: 03/01/2007 Document Revised: 03/02/2014 Document Reviewed: 12/23/2011 Elsevier Interactive Patient Education 2016 [employer]. , Care After Refer to this sheet in the next few weeks. These instructions provide you with information on caring for yourself after your procedure. Your caregiver may also give you more specific instructions. Your treatment has been planned according to current medical practices, but problems sometimes occur. Call your caregiver if you have any problems or questions after your procedure.  HOME CARE INSTRUCTIONS  Rest at home the day of the  procedure. You will likely be able to return to normal activities the following day.  Follow your caregiver's specific instructions for the type of device that you have.  Only take over-the-counter or prescription medicines as directed by your caregiver.  Keep the insertion site of the catheter clean and dry at all times.  Change the bandages (dressings) over the catheter site as directed by your caregiver.  Wash the area around the catheter site during each dressing change. Sponge bathe the area using a germ-killing (antiseptic) solution as directed by your caregiver.  Look for redness or swelling at the insertion site during each dressing change.  Apply an antibiotic ointment as directed by your caregiver.  Flush your catheter as directed to keep it from becoming clogged.  Always wash your hands thoroughly before changing dressings or flushing the catheter.  Do notlet air enter the catheter.  Never open the cap at the catheter tip.  Always make sure there is no air in the syringe or in the tubing for infusions.   Do  notlift anything heavy.  Do not drive until your caregiver approves.  Do not shower or bathe until your caregiver approves. When you shower or bathe, place a piece of plastic wrap over the catheter site. Do not allow the catheter site or the dressing to get wet. If taking a bath, do not allow the catheter to get submerged in the water. If the catheter was inserted through an arm vein:  Avoid wearing tight clothes or jewelry on the arm that has the catheter.   Do not sleep with your head on the arm that has the catheter.   Do not allow use of a blood pressure cuff on the arm that has the catheter.   Do not let anyone draw blood from the arm that has the catheter, except through the catheter itself. SEEK MEDICAL CARE IF:  You have bleeding at the insertion site of the catheter.   You feel weak or nauseous.   Your catheter is not working properly.   You have redness, pain, swelling, and warmth at the insertion site.   You notice fluid draining from the insertion site.  SEEK IMMEDIATE MEDICAL CARE IF:  Your catheter breaks or has a hole in it.   Your catheter comes loose or gets pulled completely out. If this happens, hold firm pressure over the area with your hand or a clean cloth.   You have a fever.  You have chills.   Your catheter becomes totally blocked.   You have swelling in your arm, shoulder, neck, or face.   You have bleeding from the insertion site that does not stop.   You develop chest pain or have trouble breathing.   You feel dizzy or faint.  MAKE SURE YOU:  Understand these instructions.  Will watch your condition.  Will get help right away if you are not doing well or get worse.   This information is not intended to replace advice given to you by your health care provider. Make sure you discuss any questions you have with your health care provider.   Document Released: 01/27/2012 Document Revised: 10/12/2012 Document Reviewed:  01/27/2012 Elsevier Interactive Patient Education 2016 [employer] PATIENT INSTRUCTIONS POST-ANESTHESIA  IMMEDIATELY FOLLOWING SURGERY:  Do not drive or operate machinery for the first twenty four hours after surgery.  Do not make any important decisions for twenty four hours after surgery or while taking narcotic pain medications or sedatives.  If you develop intractable  nausea and vomiting or a severe headache please notify your doctor immediately.  FOLLOW-UP:  Please make an appointment with your surgeon as instructed. You do not need to follow up with anesthesia unless specifically instructed to do so.  WOUND CARE INSTRUCTIONS (if applicable):  Keep a dry clean dressing on the anesthesia/puncture wound site if there is drainage.  Once the wound has quit draining you may leave it open to air.  Generally you should leave the bandage intact for twenty four hours unless there is drainage.  If the epidural site drains for more than 36-48 hours please call the anesthesia department.  QUESTIONS?:  Please feel free to call your physician or the hospital operator if you have any questions, and they will be happy to assist you.

## 2015-09-11 ENCOUNTER — Ambulatory Visit (HOSPITAL_COMMUNITY): Admission: RE | Admit: 2015-09-11 | Payer: Medicare Other | Source: Ambulatory Visit | Admitting: Surgery

## 2015-09-11 ENCOUNTER — Encounter (HOSPITAL_COMMUNITY)
Admission: RE | Disposition: A | Discharge status: Skilled Nursing Facility | Payer: Self-pay | Source: Ambulatory Visit | Attending: Surgery

## 2015-09-11 ENCOUNTER — Ambulatory Visit (HOSPITAL_COMMUNITY)
Admission: RE | Admit: 2015-09-11 | Discharge: 2015-09-11 | Disposition: A | Discharge status: Skilled Nursing Facility | Payer: Medicare Other | Source: Ambulatory Visit | Attending: Surgery | Admitting: Surgery

## 2015-09-11 ENCOUNTER — Ambulatory Visit (HOSPITAL_COMMUNITY): Payer: Medicare Other

## 2015-09-11 ENCOUNTER — Ambulatory Visit (HOSPITAL_COMMUNITY): Payer: Medicare Other | Admitting: Anesthesiology

## 2015-09-11 ENCOUNTER — Encounter (HOSPITAL_COMMUNITY): Payer: Self-pay | Admitting: *Deleted

## 2015-09-11 ENCOUNTER — Ambulatory Visit (HOSPITAL_COMMUNITY)
Admission: RE | Admit: 2015-09-11 | Discharge: 2015-09-11 | Disposition: A | Discharge status: Home / Self Care | Payer: Medicare Other | Source: Ambulatory Visit | Attending: Surgery | Admitting: Surgery

## 2015-09-11 DIAGNOSIS — I69351 Hemiplegia and hemiparesis following cerebral infarction affecting right dominant side: Secondary | ICD-10-CM | POA: Diagnosis not present

## 2015-09-11 DIAGNOSIS — N2889 Other specified disorders of kidney and ureter: Secondary | ICD-10-CM | POA: Diagnosis not present

## 2015-09-11 DIAGNOSIS — Z7982 Long term (current) use of aspirin: Secondary | ICD-10-CM | POA: Insufficient documentation

## 2015-09-11 DIAGNOSIS — Z79899 Other long term (current) drug therapy: Secondary | ICD-10-CM | POA: Insufficient documentation

## 2015-09-11 DIAGNOSIS — Z452 Encounter for adjustment and management of vascular access device: Secondary | ICD-10-CM | POA: Diagnosis not present

## 2015-09-11 DIAGNOSIS — E1122 Type 2 diabetes mellitus with diabetic chronic kidney disease: Secondary | ICD-10-CM | POA: Diagnosis not present

## 2015-09-11 DIAGNOSIS — Z992 Dependence on renal dialysis: Secondary | ICD-10-CM

## 2015-09-11 DIAGNOSIS — N186 End stage renal disease: Secondary | ICD-10-CM | POA: Diagnosis not present

## 2015-09-11 DIAGNOSIS — I120 Hypertensive chronic kidney disease with stage 5 chronic kidney disease or end stage renal disease: Secondary | ICD-10-CM | POA: Diagnosis not present

## 2015-09-11 DIAGNOSIS — Z95828 Presence of other vascular implants and grafts: Secondary | ICD-10-CM

## 2015-09-11 HISTORY — PX: INSERTION OF DIALYSIS CATHETER: SHX1324

## 2015-09-11 LAB — GLUCOSE, CAPILLARY: Glucose-Capillary: 86 mg/dL (ref 65–99)

## 2015-09-11 SURGERY — INSERTION OF DIALYSIS CATHETER
Anesthesia: Monitor Anesthesia Care | Site: Neck | Laterality: Right

## 2015-09-11 MED ORDER — ONDANSETRON HCL 4 MG/2ML IJ SOLN: 4.0000 mg | Freq: Once | INTRAMUSCULAR | Status: DC | PRN

## 2015-09-11 MED ORDER — FENTANYL CITRATE (PF) 100 MCG/2ML IJ SOLN
INTRAMUSCULAR | Status: AC
  Filled 2015-09-11: qty 2

## 2015-09-11 MED ORDER — LIDOCAINE HCL (PF) 1 % IJ SOLN
INTRAMUSCULAR | Status: DC | PRN
  Administered 2015-09-11: 5 mL

## 2015-09-11 MED ORDER — SODIUM CHLORIDE 0.9 % IV SOLN
INTRAVENOUS | Status: DC | PRN
  Administered 2015-09-11: 500 mL via INTRAVENOUS

## 2015-09-11 MED ORDER — BUPIVACAINE HCL (PF) 0.5 % IJ SOLN
INTRAMUSCULAR | Status: DC | PRN
  Administered 2015-09-11: 5 mL

## 2015-09-11 MED ORDER — SODIUM CHLORIDE 0.9 % IV SOLN
INTRAVENOUS | Status: DC
  Administered 2015-09-11: 09:00:00 via INTRAVENOUS

## 2015-09-11 MED ORDER — LIDOCAINE HCL (PF) 1 % IJ SOLN
INTRAMUSCULAR | Status: AC
  Filled 2015-09-11: qty 30

## 2015-09-11 MED ORDER — FENTANYL CITRATE (PF) 100 MCG/2ML IJ SOLN
25.0000 ug | Freq: Once | INTRAMUSCULAR | Status: AC
  Administered 2015-09-11: 25 ug via INTRAVENOUS

## 2015-09-11 MED ORDER — CEFAZOLIN SODIUM-DEXTROSE 2-4 GM/100ML-% IV SOLN
2.0000 g | INTRAVENOUS | Status: AC
  Administered 2015-09-11: 2 g via INTRAVENOUS

## 2015-09-11 MED ORDER — HEPARIN SODIUM (PORCINE) 1000 UNIT/ML IJ SOLN
INTRAMUSCULAR | Status: AC
  Filled 2015-09-11: qty 4

## 2015-09-11 MED ORDER — OXYCODONE-ACETAMINOPHEN 5-325 MG PO TABS
1.0000 | ORAL_TABLET | Freq: Once | ORAL | Status: AC
  Administered 2015-09-11: 1 via ORAL

## 2015-09-11 MED ORDER — FENTANYL CITRATE (PF) 100 MCG/2ML IJ SOLN: 25.0000 ug | INTRAMUSCULAR | Status: DC | PRN

## 2015-09-11 MED ORDER — LACTATED RINGERS IV SOLN: INTRAVENOUS | Status: DC

## 2015-09-11 MED ORDER — CHLORHEXIDINE GLUCONATE CLOTH 2 % EX PADS: 6.0000 | MEDICATED_PAD | Freq: Once | CUTANEOUS | Status: DC

## 2015-09-11 MED ORDER — HEPARIN SODIUM (PORCINE) 1000 UNIT/ML IJ SOLN
INTRAMUSCULAR | Status: DC | PRN
  Administered 2015-09-11: 4000 [IU] via INTRAVENOUS

## 2015-09-11 MED ORDER — CHLORHEXIDINE GLUCONATE CLOTH 2 % EX PADS
6.0000 | MEDICATED_PAD | Freq: Once | CUTANEOUS | Status: DC
Start: 2015-09-11 — End: 2015-09-13

## 2015-09-11 MED ORDER — MIDAZOLAM HCL 2 MG/2ML IJ SOLN
INTRAMUSCULAR | Status: AC
  Filled 2015-09-11: qty 2

## 2015-09-11 MED ORDER — PROPOFOL 10 MG/ML IV BOLUS
INTRAVENOUS | Status: AC
  Filled 2015-09-11: qty 20

## 2015-09-11 MED ORDER — PROPOFOL 500 MG/50ML IV EMUL
INTRAVENOUS | Status: DC | PRN
  Administered 2015-09-11: 25 ug/kg/min via INTRAVENOUS

## 2015-09-11 MED ORDER — MIDAZOLAM HCL 2 MG/2ML IJ SOLN
1.0000 mg | INTRAMUSCULAR | Status: DC | PRN
  Administered 2015-09-11: 1 mg via INTRAVENOUS

## 2015-09-11 MED ORDER — BUPIVACAINE HCL (PF) 0.5 % IJ SOLN
INTRAMUSCULAR | Status: AC
  Filled 2015-09-11: qty 30

## 2015-09-11 MED ORDER — MIDAZOLAM HCL 5 MG/5ML IJ SOLN
INTRAMUSCULAR | Status: DC | PRN
  Administered 2015-09-11 (×2): 0.5 mg via INTRAVENOUS

## 2015-09-11 MED ORDER — CEFAZOLIN SODIUM-DEXTROSE 2-4 GM/100ML-% IV SOLN
INTRAVENOUS | Status: AC
  Filled 2015-09-11: qty 100

## 2015-09-11 MED ORDER — OXYCODONE-ACETAMINOPHEN 5-325 MG PO TABS
ORAL_TABLET | ORAL | Status: AC
  Filled 2015-09-11: qty 1

## 2015-09-11 SURGICAL SUPPLY — 45 items
BAG DECANTER FOR FLEXI CONT (MISCELLANEOUS) ×3 IMPLANT
BAG HAMPER (MISCELLANEOUS) ×3 IMPLANT
BENZOIN TINCTURE PRP APPL 2/3 (GAUZE/BANDAGES/DRESSINGS) IMPLANT
BIOPATCH RED 1 DISK 7.0 (GAUZE/BANDAGES/DRESSINGS) ×2 IMPLANT
BIOPATCH RED 1IN DISK 7.0MM (GAUZE/BANDAGES/DRESSINGS) ×1
CATH PALINDROME RT-P 15FX19CM (CATHETERS) IMPLANT
CATH PALINDROME RT-P 15FX23CM (CATHETERS) IMPLANT
CATH PALINDROME RT-P 15FX28CM (CATHETERS) IMPLANT
CATH PALINDROME RT-P 15FX55CM (CATHETERS) IMPLANT
CATH PALINDROME-P 19CM W/VT (CATHETERS) ×3 IMPLANT
CLOSURE STERI-STRIP 1/2X4 (GAUZE/BANDAGES/DRESSINGS) ×1
CLOSURE WOUND 1/2 X4 (GAUZE/BANDAGES/DRESSINGS)
CLSR STERI-STRIP ANTIMIC 1/2X4 (GAUZE/BANDAGES/DRESSINGS) ×2 IMPLANT
COVER PROBE W GEL 5X96 (DRAPES) IMPLANT
DECANTER SPIKE VIAL GLASS SM (MISCELLANEOUS) ×3 IMPLANT
DRAPE C-ARM 42X72 X-RAY (DRAPES) ×3 IMPLANT
DRAPE C-ARMOR (DRAPES) ×3 IMPLANT
DRAPE CHEST BREAST 15X10 FENES (DRAPES) ×3 IMPLANT
DRSG SORBAVIEW 3.5X5-5/16 MED (GAUZE/BANDAGES/DRESSINGS) ×6 IMPLANT
GAUZE SPONGE 2X2 8PLY STRL LF (GAUZE/BANDAGES/DRESSINGS) ×1 IMPLANT
GAUZE SPONGE 4X4 16PLY XRAY LF (GAUZE/BANDAGES/DRESSINGS) ×3 IMPLANT
GLOVE BIOGEL PI IND STRL 7.5 (GLOVE) ×2 IMPLANT
GLOVE BIOGEL PI INDICATOR 7.5 (GLOVE) ×4
GLOVE ECLIPSE 7.0 STRL STRAW (GLOVE) ×3 IMPLANT
GOWN STRL REUS W/ TWL LRG LVL3 (GOWN DISPOSABLE) ×2 IMPLANT
GOWN STRL REUS W/TWL LRG LVL3 (GOWN DISPOSABLE) ×10 IMPLANT
GOWN STRL REUS W/TWL XL LVL3 (GOWN DISPOSABLE) ×3 IMPLANT
KIT ROOM TURNOVER APOR (KITS) ×3 IMPLANT
NEEDLE 18GX1X1/2 (RX/OR ONLY) (NEEDLE) ×3 IMPLANT
NEEDLE 22X1 1/2 (OR ONLY) (NEEDLE) ×3 IMPLANT
NEEDLE HYPO 25GX1X1/2 BEV (NEEDLE) ×3 IMPLANT
NS IRRIG 1000ML POUR BTL (IV SOLUTION) IMPLANT
PACK MINOR (CUSTOM PROCEDURE TRAY) ×3 IMPLANT
PACK SURGICAL SETUP 50X90 (CUSTOM PROCEDURE TRAY) ×3 IMPLANT
PAD ARMBOARD 7.5X6 YLW CONV (MISCELLANEOUS) ×6 IMPLANT
SET BASIN LINEN APH (SET/KITS/TRAYS/PACK) ×3 IMPLANT
SPONGE GAUZE 2X2 STER 10/PKG (GAUZE/BANDAGES/DRESSINGS) ×2
STRIP CLOSURE SKIN 1/2X4 (GAUZE/BANDAGES/DRESSINGS) IMPLANT
SUT MNCRL AB 4-0 PS2 18 (SUTURE) ×3 IMPLANT
SUT SILK 2 0 FSL 18 (SUTURE) IMPLANT
SYR 20CC LL (SYRINGE) ×3 IMPLANT
SYR 5ML LL (SYRINGE) ×6 IMPLANT
SYR CONTROL 10ML LL (SYRINGE) ×3 IMPLANT
SYRINGE 10CC LL (SYRINGE) ×3 IMPLANT
WATER STERILE IRR 1000ML POUR (IV SOLUTION) IMPLANT

## 2015-09-11 NOTE — Progress Notes (Signed)
Report called to [place] LPN at 075-[id number].  Advised patient was transported to Radiology for U/S before coming back to [facility]. Daughter [name] with patient.

## 2015-09-11 NOTE — H&P (Signed)
SURGICAL HISTORY & PHYSICAL   HISTORY OF PRESENT ILLNESS (HPI):  75 y.o. right-handed female presents (along with her nursing facility caregiver - Wells Guiles) for hemodialysis access after recently presenting to her nephrologist with hyperkalemia (k+: 8) on routine labs 7/11 in the context of CKD attributed to poorly controlled DM and HTN, for which she's been admitted repeatedly. Despite K+ of 8 on 7/11, patient denies any palpitations or SOB during physical therapy, though reports decreased appetite and reduced energy levels. She was started on Kayexalate and referred for hemodialysis access.  PAST MEDICAL HISTORY (PMH):  Past Medical History  Diagnosis Date  . Diabetes mellitus without complication ([place])   . Hypertension   . Stroke [facility])     2-3 yrs ago- right sided weakness    Reviewed. Otherwise negative.   PAST SURGICAL HISTORY [facility]):  Past Surgical History  Procedure Laterality Date  . Appendectomy    . Intramedullary (im) nail intertrochanteric Right 07/20/2012    Procedure: INTRAMEDULLARY (IM) NAIL INTERTROCHANTRIC;  Surgeon: Carole Civil, MD;  Location: [facility] ORS;  Service: Orthopedics;  Laterality: Right;  . Cataract extraction w/phaco Left 06/29/2013    Procedure: CATARACT EXTRACTION PHACO AND INTRAOCULAR LENS PLACEMENT (IOC);  Surgeon: Tonny Branch, MD;  Location: [facility] ORS;  Service: Ophthalmology;  Laterality: Left;  CDE 12.25  . Cataract extraction w/phaco Right 07/27/2013    Procedure: CATARACT EXTRACTION PHACO AND INTRAOCULAR LENS PLACEMENT (IOC);  Surgeon: Tonny Branch, MD;  Location: [facility]S;  Service: Ophthalmology;  Laterality: Right;  CDE:7.72    Reviewed. Otherwise negative.   MEDICATIONS:  Prior to Admission medications   Medication Sig Start Date End Date Taking? Authorizing Provider  aspirin EC 81 MG tablet Take 81 mg by mouth daily.   Yes Historical Provider, MD  atorvastatin (LIPITOR) 20 MG tablet Take 20 mg by mouth daily.   Yes Historical Provider, MD   carvedilol (COREG) 6.25 MG tablet Take 6.25 mg by mouth 2 (two) times daily with a meal.   Yes Historical Provider, MD  hydrALAZINE (APRESOLINE) 25 MG tablet Take 25 mg by mouth 3 (three) times daily. 07/22/12  Yes Kathie Dike, MD  isosorbide mononitrate (IMDUR) 30 MG 24 hr tablet Take 30 mg by mouth daily.   Yes Historical Provider, MD  loperamide (IMODIUM A-D) 2 MG tablet Take 2 mg by mouth every 6 (six) hours as needed for diarrhea or loose stools.   Yes Historical Provider, MD  Multiple Vitamin (MULTIVITAMIN WITH MINERALS) TABS tablet Take 1 tablet by mouth daily.   Yes Historical Provider, MD  sodium bicarbonate 650 MG tablet Take 650 mg by mouth 3 (three) times daily.   Yes Historical Provider, MD     ALLERGIES:  Allergies  Allergen Reactions  . Grapefruit Flavor [Flavoring Agent]     Reaction unknown     SOCIAL HISTORY:  Social History   Social History  . Marital Status: Single    Spouse Name: N/A  . Number of Children: N/A  . Years of Education: N/A   Occupational History  . Not on file.   Social History Main Topics  . Smoking status: Never Smoker   . Smokeless tobacco: Not on file  . Alcohol Use: No  . Drug Use: No  . Sexual Activity: No   Other Topics Concern  . Not on file   Social History Narrative    The patient currently resides (home / rehab facility / nursing home): Acute care nursing facility  The patient normally is (ambulatory /  bedbound) : Ambulatory with assist (physical therapy)   FAMILY HISTORY:  Family History  Problem Relation Age of Onset  . Heart disease Mother   . Heart disease Father     Otherwise negative.   REVIEW OF SYSTEMS:  Constitutional: denies any other weight loss, fever, chills, or sweats  Eyes: denies any other vision changes, history of eye injury  ENT: denies sore throat, hearing problems  Respiratory: denies shortness of breath, wheezing  Cardiovascular: denies chest pain, palpitations  Gastrointestinal: denies  N/V, diarrhea  Musculoskeletal: denies any other joint pains or cramps  Skin: Denies any other rashes or skin discolorations  Neurological: denies any other headache, dizziness, weakness  Psychiatric: denies any other depression, anxiety   All other review of systems were otherwise negative.  VITAL SIGNS:  Temp:  [98.2 F (36.8 C)] 98.2 F (36.8 C) (07/18 1556) Pulse Rate:  [60] 60 (07/18 1556) Resp:  [18] 18 (07/18 1542) SpO2:  [100 %] 100 % (07/18 1556) Weight:  [36.288 kg (80 lb)] 36.288 kg (80 lb) (07/18 1556)             INTAKE/OUTPUT:  This shift:    Last 2 shifts: @IOLAST2SHIFTS @  PHYSICAL EXAM:  Constitutional:  -- Normal thin body habitus  -- Awake, alert, and oriented x3  Eyes:  -- Pupils equally round and reactive to light  -- No scleral icterus  Ear, nose, throat:  -- No jugular venous distension  Pulmonary:  -- No crackles  -- Equal breath sounds bilaterally  Cardiovascular:  -- S1, S2 present  -- No pericardial rubs  Gastrointestinal:  -- Soft, nontender, nondistended, no guarding/rebound  -- No abdominal masses appreciated, pulsatile or otherwise  Musculoskeletal / Integumentary:  -- Wounds or skin discoloration: None appreciated -- Extremities: B/L UE and LE FROM, hands and feet warm, no edema, B/L radial artery pulses palpable Neurologic:  -- Motor function: Intact and symmetric -- Sensation: Intact and symmetric  Labs:  CBC:  Lab Results  Component Value Date   WBC 5.6 07/22/2012   RBC 3.59* 07/22/2012   BMP:  Lab Results  Component Value Date   GLUCOSE 226* 06/21/2013   CO2 19 06/21/2013   BUN 30* 06/21/2013   CREATININE 2.01* 06/21/2013   CALCIUM 9.0 06/21/2013   Coagulation:  Lab Results  Component Value Date   INR 1.03 07/17/2012     Imaging studies:  No recent pertinent imaging studies   Assessment/Plan:  75 y.o. right-handed female with CKD/ESRD attributed to poorly controlled DM and HTN, complicated by recently diagnosed  hyperkalemia (K+: 8), for which she was given kayexalate and referred for hemodialysis access, and pertinent comorbidities including a history of stroke with mild residual RUE weakness.    - will plan for placement of tunneled hemodialysis catheter tomorrow   - discussed already with anesthesia, will be seen today for pre-op eval immediately following her surgical appt   - all risks, benefits, and alternatives to above procedures were discussed, all of the patient's questions were answered to her expressed satisfaction, and informed consent was obtained  - patient will need and be ordered/scheduled B/L upper extremity vein mapping for AV fistula  - dialysis scheduled to start Thursday 7/20 at [facility] in [place] per nephrology  All of the above findings and recommendations were discussed with the patient , and all of her questions were answered to her expressed satisfaction.  -- Marilynne Drivers Rosana Hoes, MD, [place]: [place] [facility] Office: [phone]

## 2015-09-11 NOTE — Progress Notes (Signed)
CXR done

## 2015-09-11 NOTE — Discharge Instructions (Addendum)
In addition to included general post-operative instructions for Hemodialysis Catheter insertion,  Diet: Resume home heart healthy renal diabetic diet.  Activity: No heavy lifting (children, pets, laundry) or strenuous activity until follow-up, but light activity and walking are encouraged. Do not drive or drink alcohol if taking narcotic pain medications.   Wound care: Do not get incision wet. May provide sponge baths, etc, but keep dressings and incision clean and dry.   Medications: Resume all home medications. For mild to moderate pain: acetaminophen (Tylenol) or ibuprofen (if no kidney disease). Narcotic pain medications, if prescribed, can be used for severe pain, though may cause nausea, constipation, and drowsiness. Do not combine Tylenol and Percocet within a 6 hour period as Percocet contains Tylenol.  Call office [phone]) at any time if any questions, worsening pain, fevers/chills, bleeding, drainage from incision site, or other concerns.   PATIENT INSTRUCTIONS POST-ANESTHESIA  IMMEDIATELY FOLLOWING SURGERY:  Do not drive or operate machinery for the first twenty four hours after surgery.  Do not make any important decisions for twenty four hours after surgery or while taking narcotic pain medications or sedatives.  If you develop intractable nausea and vomiting or a severe headache please notify your doctor immediately.  FOLLOW-UP:  Please make an appointment with your surgeon as instructed. You do not need to follow up with anesthesia unless specifically instructed to do so.  WOUND CARE INSTRUCTIONS (if applicable):  Keep a dry clean dressing on the anesthesia/puncture wound site if there is drainage.  Once the wound has quit draining you may leave it open to air.  Generally you should leave the bandage intact for twenty four hours unless there is drainage.  If the epidural site drains for more than 36-48 hours please call the anesthesia department.  QUESTIONS?:  Please feel  free to call your physician or the hospital operator if you have any questions, and they will be happy to assist you.       Central Line Dialysis Access Placement, Care After Refer to this sheet after your procedure. These instructions provide you with information on caring for yourself after your procedure. Your health care provider may also give you more specific instructions. Your treatment has been planned according to current medical practices, but problems sometimes occur. Call your health care provider if you have any problems or questions after your procedure.  WHAT TO EXPECT AFTER THE PROCEDURE  You may feel some discomfort after the local anesthetic wears off. Your discomfort should gradually improve over the next several days. Ask your health care provider if you can take pain medicines.  You may notice some redness, mild pain, swelling, bruising, or light bleeding at the site where the thin, flexible tube (catheter) was placed. These should improve over the next several days. HOME CARE INSTRUCTIONS   Rest for the remainder of the day.  Avoid any heavy lifting (more than 10 lb [4.5 kg]) for at least 3 days.   If your catheter bandage (dressing) becomes soaked with blood, apply firm and direct pressure on the insertion site and sit up for 20 minutes. Your dialysis nurses will change your dressing during your next visit or at your next dialysis treatment.  Keep the dressing around the insertion site dry. Avoid using the shower. You may take a bath after 24 hours. Bathe in a tub and keep the dressing and catheter above the level of the water. Do not submerge catheter underwater, such as by swimming.  You may resume your usual diet.  Do not operate heavy machinery, drive, or make legal decisions for the first 24 hours after the procedure if you were given sedatives or other medicines to help you relax.  SEEK MEDICAL CARE IF:  The catheter gets pulled or comes out.  You develop  any signs of infection around the insertion site such as:   Bleeding that does not stop even after applying pressure as instructed.  Increased pain.  Unusual drainage such as pus.   You develop a fever or chills.   You have any other questions or concerns related to your procedure or the care of your central line.  SEEK IMMEDIATE MEDICAL CARE IF:  You develop lightheadedness or dizziness.   You faint.   You develop shortness of breath or difficulty breathing.   You have any symptoms of an allergic reaction, such as:  Itching.   Rash at the site of insertion.   This information is not intended to replace advice given to you by your health care provider. Make sure you discuss any questions you have with your health care provider.   Document Released: 09/24/2003 Document Revised: 02/14/2013 Document Reviewed: 11/19/2011 Elsevier Interactive Patient Education [employer].

## 2015-09-11 NOTE — Anesthesia Postprocedure Evaluation (Signed)
Anesthesia Post Note  Patient: Courtney [name]  Procedure(s) Performed: Procedure(s) (LRB): INSERTION OF TUNNELED DIALYSIS CATHETER (Right)  Patient location during evaluation: PACU Anesthesia Type: MAC Level of consciousness: awake, oriented and patient cooperative Pain management: pain level controlled Vital Signs Assessment: post-procedure vital signs reviewed and stable Respiratory status: spontaneous breathing, nonlabored ventilation and respiratory function stable Cardiovascular status: blood pressure returned to baseline Postop Assessment: no signs of nausea or vomiting Anesthetic complications: no    Last Vitals:  Filed Vitals:   09/11/15 1127 09/11/15 1130  BP: 174/72 190/69  Pulse: 54 52  Temp: 36.5 C   Resp: 14 13    Last Pain: There were no vitals filed for this visit.               Lenis Nettleton J

## 2015-09-11 NOTE — Op Note (Signed)
SURGICAL PROCEDURE REPORT  DATE OF PROCEDURE: 09/11/2015   ATTENDING SURGEON: Corene Cornea E. Rosana Hoes, MD   ANESTHESIA: Local with light IV sedation   PRE-OPERATIVE DIAGNOSIS: ESRD requiring functional hemodialysis access   POST-OPERATIVE DIAGNOSIS: ESRD requiring functional hemodialysis access   PROCEDURE(S):  1.) Percutaneous access of Right internal jugular vein under ultrasound guidance  2.) Insertion of Right internal jugular cuffed tunneled Covidien Palendrome hemodialysis catheter  INTRAOPERATIVE FINDINGS: Patent easily compressible Right internal jugular vein with appropriate respiratory variations and well-secured cuffed tunneled hemodialysis catheter at completion of the procedure  INTRAOPERATIVE FLUIDS: 150 mL crystalloid, 0 mL contrast used   FLUOROSCOPY: 3 seconds  ESTIMATED BLOOD LOSS: Minimal (<20 mL)   SPECIMENS: None   IMPLANTS: 19 cm 14.55F cuffed tunneled Covidien Palendrome hemodialysis catheter  DRAINS: None   COMPLICATIONS: None apparent   CONDITION AT COMPLETION: Hemodynamically stable, awake   DISPOSITION: Recovery   INDICATION(S) FOR PROCEDURE:  Patient is a 75 y.o. right-handed female presented for hemodialysis access after recently presenting to her nephrologist with hyperkalemia (k+: 8) on routine labs 7/11 in the context of CKD attributed to poorly controlled DM and HTN, for which she's been admitted repeatedly. Despite K+ of 8 on 7/11, patient denies any palpitations or SOB during physical therapy, though reports decreased appetite and reduced energy levels. She was started on Kayexalate and referred for hemodialysis access. All risks, benefits, and alternatives to above elective procedures were discussed with the patient, who elected to proceed, and informed consent was accordingly obtained at that time.  DETAILS OF PROCEDURE:  Patient was brought to the operative suite and appropriately identified. In Trendelenburg position, Right IJ venous access site  was prepped and draped in the usual sterile fashion, and following a brief timeout, limited duplex evaluation of Right internal jugular vein was performed. Percutaneous Right IJ venous access was obtained under ultrasound guidance using Seldinger technique, by which local anesthetic was injected over the Right IJ vein, and access needle was inserted into the Right IJ vein, through which soft guidewire was advanced, over which access needle was withdrawn. Guidewire was secured, attention was directed to injection of local anesthetic along the planned tunnel site, and hemodialysis catheter was tunneled retrograde from a small incision over the Right chest to the Right IJ access site. Sequential dilators were advanced over guidewire and withdrawn, followed by insertion of sheath for tunneled hemodialysis catheter. Catheter was then advanced through the sheath into Right internal jugular vein and under direct fluoroscopic visualization into the SVC and proximal IVC. Cuffed tunneled hemodialysis catheter was then secured to skin with 2-0 silk suture, and skin at insertion site was re-approximated with buried interrupted 4-0 Vicryl suture. Skin was cleaned, dried, and sterile occlusive dressing was applied. Patient was then safely transferred to PACU.   I was present for all aspects of the procedures, and there were no intraprocedural complications apparent.

## 2015-09-11 NOTE — Transfer of Care (Signed)
Immediate Anesthesia Transfer of Care Note  Patient: Courtney [name]  Procedure(s) Performed: Procedure(s): INSERTION OF TUNNELED DIALYSIS CATHETER (Right)  Patient Location: PACU  Anesthesia Type:MAC  Level of Consciousness: awake and patient cooperative  Airway & Oxygen Therapy: Patient Spontanous Breathing and Patient connected to face mask oxygen  Post-op Assessment: Report given to RN, Post -op Vital signs reviewed and stable and Patient moving all extremities  Post vital signs: Reviewed and stable  Last Vitals:  Filed Vitals:   09/11/15 0855 09/11/15 0915  BP:    Pulse:    Temp:    Resp: 0 14    Last Pain: There were no vitals filed for this visit.       Complications: No apparent anesthesia complications

## 2015-09-11 NOTE — Interval H&P Note (Signed)
History and Physical Interval Note:  09/11/2015 9:40 AM  Courtney [name]  has presented today for surgery, with the diagnosis of end stage renal disease  The various methods of treatment have been discussed with the patient and family. After consideration of risks, benefits and other options for treatment, the patient has consented to  Procedure(s): INSERTION OF TUNNELED DIALYSIS CATHETER (N/A) as a surgical intervention .  The patient's history has been reviewed, patient examined, no change in status, stable for surgery.  I have reviewed the patient's chart and labs.  Questions were answered to the patient's satisfaction.     Vickie Epley

## 2015-09-11 NOTE — Progress Notes (Signed)
Awake. Dressed in PACU and transferred to her own w/c. Tolerated well.

## 2015-09-11 NOTE — Progress Notes (Signed)
Dr. Rosana Hoes in to speak with patient and daughter.

## 2015-09-11 NOTE — Anesthesia Preprocedure Evaluation (Signed)
Anesthesia Evaluation  Patient identified by MRN, date of birth, ID band Patient awake    Reviewed: Allergy & Precautions, H&P , NPO status , Patient's Chart, lab work & pertinent test results  Airway Mallampati: II  TM Distance: >3 FB Neck ROM: Full    Dental  (+) Edentulous Upper, Edentulous Lower   Pulmonary neg pulmonary ROS,    breath sounds clear to auscultation       Cardiovascular hypertension, Pt. on medications  Rhythm:Regular Rate:Normal     Neuro/Psych CVA, Residual Symptoms    GI/Hepatic   Endo/Other  diabetes, Type 2, Oral Hypoglycemic Agents  Renal/GU ESRFRenal disease     Musculoskeletal   Abdominal   Peds  Hematology   Anesthesia Other Findings   Reproductive/Obstetrics                             Anesthesia Physical Anesthesia Plan  ASA: III  Anesthesia Plan: MAC   Post-op Pain Management:    Induction: Intravenous  Airway Management Planned: Simple Face Mask  Additional Equipment:   Intra-op Plan:   Post-operative Plan:   Informed Consent: I have reviewed the patients History and Physical, chart, labs and discussed the procedure including the risks, benefits and alternatives for the proposed anesthesia with the patient or authorized representative who has indicated his/her understanding and acceptance.     Plan Discussed with:   Anesthesia Plan Comments:         Anesthesia Quick Evaluation

## 2015-09-12 DIAGNOSIS — N2581 Secondary hyperparathyroidism of renal origin: Secondary | ICD-10-CM | POA: Diagnosis not present

## 2015-09-12 DIAGNOSIS — Z992 Dependence on renal dialysis: Secondary | ICD-10-CM | POA: Diagnosis not present

## 2015-09-12 DIAGNOSIS — N186 End stage renal disease: Secondary | ICD-10-CM | POA: Diagnosis not present

## 2015-09-12 DIAGNOSIS — D509 Iron deficiency anemia, unspecified: Secondary | ICD-10-CM | POA: Diagnosis not present

## 2015-09-12 DIAGNOSIS — D631 Anemia in chronic kidney disease: Secondary | ICD-10-CM | POA: Diagnosis not present

## 2015-09-12 DIAGNOSIS — Z23 Encounter for immunization: Secondary | ICD-10-CM | POA: Diagnosis not present

## 2015-09-13 DIAGNOSIS — N185 Chronic kidney disease, stage 5: Secondary | ICD-10-CM | POA: Diagnosis not present

## 2015-09-13 DIAGNOSIS — I5022 Chronic systolic (congestive) heart failure: Secondary | ICD-10-CM | POA: Diagnosis not present

## 2015-09-13 DIAGNOSIS — I10 Essential (primary) hypertension: Secondary | ICD-10-CM | POA: Diagnosis not present

## 2015-09-13 DIAGNOSIS — E119 Type 2 diabetes mellitus without complications: Secondary | ICD-10-CM | POA: Diagnosis not present

## 2015-09-14 DIAGNOSIS — D509 Iron deficiency anemia, unspecified: Secondary | ICD-10-CM | POA: Diagnosis not present

## 2015-09-14 DIAGNOSIS — N2581 Secondary hyperparathyroidism of renal origin: Secondary | ICD-10-CM | POA: Diagnosis not present

## 2015-09-14 DIAGNOSIS — Z992 Dependence on renal dialysis: Secondary | ICD-10-CM | POA: Diagnosis not present

## 2015-09-14 DIAGNOSIS — N186 End stage renal disease: Secondary | ICD-10-CM | POA: Diagnosis not present

## 2015-09-14 DIAGNOSIS — Z23 Encounter for immunization: Secondary | ICD-10-CM | POA: Diagnosis not present

## 2015-09-14 DIAGNOSIS — D631 Anemia in chronic kidney disease: Secondary | ICD-10-CM | POA: Diagnosis not present

## 2015-09-17 ENCOUNTER — Encounter (HOSPITAL_COMMUNITY): Payer: Self-pay | Admitting: Surgery

## 2015-09-17 DIAGNOSIS — N2581 Secondary hyperparathyroidism of renal origin: Secondary | ICD-10-CM | POA: Diagnosis not present

## 2015-09-17 DIAGNOSIS — N186 End stage renal disease: Secondary | ICD-10-CM | POA: Diagnosis not present

## 2015-09-17 DIAGNOSIS — D631 Anemia in chronic kidney disease: Secondary | ICD-10-CM | POA: Diagnosis not present

## 2015-09-17 DIAGNOSIS — Z992 Dependence on renal dialysis: Secondary | ICD-10-CM | POA: Diagnosis not present

## 2015-09-17 DIAGNOSIS — D509 Iron deficiency anemia, unspecified: Secondary | ICD-10-CM | POA: Diagnosis not present

## 2015-09-17 DIAGNOSIS — Z23 Encounter for immunization: Secondary | ICD-10-CM | POA: Diagnosis not present

## 2015-09-19 ENCOUNTER — Encounter (HOSPITAL_COMMUNITY)
Admission: RE | Admit: 2015-09-19 | Discharge: 2015-09-19 | Disposition: A | Discharge status: Home / Self Care | Payer: Medicare Other | Source: Ambulatory Visit | Attending: Surgery | Admitting: Surgery

## 2015-09-19 ENCOUNTER — Encounter (HOSPITAL_COMMUNITY): Payer: Self-pay

## 2015-09-19 DIAGNOSIS — Z23 Encounter for immunization: Secondary | ICD-10-CM | POA: Diagnosis not present

## 2015-09-19 DIAGNOSIS — D631 Anemia in chronic kidney disease: Secondary | ICD-10-CM | POA: Diagnosis not present

## 2015-09-19 DIAGNOSIS — N2581 Secondary hyperparathyroidism of renal origin: Secondary | ICD-10-CM | POA: Diagnosis not present

## 2015-09-19 DIAGNOSIS — N186 End stage renal disease: Secondary | ICD-10-CM | POA: Diagnosis not present

## 2015-09-19 DIAGNOSIS — Z992 Dependence on renal dialysis: Secondary | ICD-10-CM | POA: Diagnosis not present

## 2015-09-19 DIAGNOSIS — D509 Iron deficiency anemia, unspecified: Secondary | ICD-10-CM | POA: Diagnosis not present

## 2015-09-20 ENCOUNTER — Encounter (HOSPITAL_COMMUNITY)
Admission: RE | Disposition: A | Discharge status: Skilled Nursing Facility | Payer: Self-pay | Source: Ambulatory Visit | Attending: Surgery

## 2015-09-20 ENCOUNTER — Encounter (HOSPITAL_COMMUNITY): Payer: Self-pay | Admitting: *Deleted

## 2015-09-20 ENCOUNTER — Ambulatory Visit (HOSPITAL_COMMUNITY): Payer: Medicare Other | Admitting: Anesthesiology

## 2015-09-20 ENCOUNTER — Ambulatory Visit (HOSPITAL_COMMUNITY)
Admission: RE | Admit: 2015-09-20 | Discharge: 2015-09-20 | Disposition: A | Discharge status: Skilled Nursing Facility | Payer: Medicare Other | Source: Ambulatory Visit | Attending: Surgery | Admitting: Surgery

## 2015-09-20 DIAGNOSIS — Z9842 Cataract extraction status, left eye: Secondary | ICD-10-CM | POA: Diagnosis not present

## 2015-09-20 DIAGNOSIS — Z992 Dependence on renal dialysis: Secondary | ICD-10-CM | POA: Diagnosis not present

## 2015-09-20 DIAGNOSIS — N186 End stage renal disease: Secondary | ICD-10-CM | POA: Diagnosis not present

## 2015-09-20 DIAGNOSIS — Z9841 Cataract extraction status, right eye: Secondary | ICD-10-CM | POA: Insufficient documentation

## 2015-09-20 DIAGNOSIS — E1122 Type 2 diabetes mellitus with diabetic chronic kidney disease: Secondary | ICD-10-CM | POA: Diagnosis not present

## 2015-09-20 DIAGNOSIS — I69351 Hemiplegia and hemiparesis following cerebral infarction affecting right dominant side: Secondary | ICD-10-CM | POA: Diagnosis not present

## 2015-09-20 DIAGNOSIS — Z79899 Other long term (current) drug therapy: Secondary | ICD-10-CM | POA: Insufficient documentation

## 2015-09-20 DIAGNOSIS — I120 Hypertensive chronic kidney disease with stage 5 chronic kidney disease or end stage renal disease: Secondary | ICD-10-CM | POA: Diagnosis not present

## 2015-09-20 DIAGNOSIS — Z7982 Long term (current) use of aspirin: Secondary | ICD-10-CM | POA: Diagnosis not present

## 2015-09-20 DIAGNOSIS — Z8249 Family history of ischemic heart disease and other diseases of the circulatory system: Secondary | ICD-10-CM | POA: Insufficient documentation

## 2015-09-20 DIAGNOSIS — Z9102 Food additives allergy status: Secondary | ICD-10-CM | POA: Insufficient documentation

## 2015-09-20 HISTORY — PX: AV FISTULA PLACEMENT: SHX1204

## 2015-09-20 LAB — CBC WITH DIFFERENTIAL/PLATELET
Basophils Absolute: 0 10*3/uL (ref 0.0–0.1)
Basophils Relative: 0 %
EOS ABS: 0.2 10*3/uL (ref 0.0–0.7)
Eosinophils Relative: 3 %
HEMATOCRIT: 26.3 % — AB (ref 36.0–46.0)
HEMOGLOBIN: 8.4 g/dL — AB (ref 12.0–15.0)
LYMPHS ABS: 1.4 10*3/uL (ref 0.7–4.0)
LYMPHS PCT: 23 %
MCH: 29.1 pg (ref 26.0–34.0)
MCHC: 31.9 g/dL (ref 30.0–36.0)
MCV: 91 fL (ref 78.0–100.0)
MONOS PCT: 10 %
Monocytes Absolute: 0.6 10*3/uL (ref 0.1–1.0)
NEUTROS ABS: 3.8 10*3/uL (ref 1.7–7.7)
NEUTROS PCT: 64 %
Platelets: 164 10*3/uL (ref 150–400)
RBC: 2.89 MIL/uL — AB (ref 3.87–5.11)
RDW: 14.4 % (ref 11.5–15.5)
WBC: 5.9 10*3/uL (ref 4.0–10.5)

## 2015-09-20 LAB — BASIC METABOLIC PANEL
ANION GAP: 4 — AB (ref 5–15)
BUN: 12 mg/dL (ref 6–20)
CHLORIDE: 107 mmol/L (ref 101–111)
CO2: 28 mmol/L (ref 22–32)
Calcium: 8.3 mg/dL — ABNORMAL LOW (ref 8.9–10.3)
Creatinine, Ser: 2.41 mg/dL — ABNORMAL HIGH (ref 0.44–1.00)
GFR calc non Af Amer: 19 mL/min — ABNORMAL LOW (ref >60)
GFR, EST AFRICAN AMERICAN: 22 mL/min — AB (ref >60)
Glucose, Bld: 88 mg/dL (ref 65–99)
POTASSIUM: 3.7 mmol/L (ref 3.5–5.1)
SODIUM: 139 mmol/L (ref 135–145)

## 2015-09-20 LAB — GLUCOSE, CAPILLARY
GLUCOSE-CAPILLARY: 77 mg/dL (ref 65–99)
GLUCOSE-CAPILLARY: 102 mg/dL — AB (ref 65–99)
Glucose-Capillary: 106 mg/dL — ABNORMAL HIGH (ref 65–99)

## 2015-09-20 SURGERY — INSERTION OF ARTERIOVENOUS (AV) GORE-TEX GRAFT ARM
Anesthesia: Monitor Anesthesia Care | Site: Arm Upper | Laterality: Right

## 2015-09-20 MED ORDER — PROPOFOL 10 MG/ML IV BOLUS
INTRAVENOUS | Status: AC
  Filled 2015-09-20: qty 20

## 2015-09-20 MED ORDER — CEFAZOLIN SODIUM-DEXTROSE 2-4 GM/100ML-% IV SOLN
INTRAVENOUS | Status: AC
  Filled 2015-09-20: qty 100

## 2015-09-20 MED ORDER — HEPARIN (PORCINE) IN NACL 6000-0.9 UNIT/L-% IV SOLN
INTRAVENOUS | Status: DC | PRN
  Administered 2015-09-20: 500 mL

## 2015-09-20 MED ORDER — FENTANYL CITRATE (PF) 100 MCG/2ML IJ SOLN
INTRAMUSCULAR | Status: AC
  Filled 2015-09-20: qty 2

## 2015-09-20 MED ORDER — OXYCODONE-ACETAMINOPHEN 5-325 MG PO TABS: 1.0000 | ORAL_TABLET | Freq: Four times a day (QID) | ORAL | 0 refills | Status: DC | PRN

## 2015-09-20 MED ORDER — HEMOSTATIC AGENTS (NO CHARGE) OPTIME
TOPICAL | Status: DC | PRN
  Administered 2015-09-20 (×2): 1 via TOPICAL

## 2015-09-20 MED ORDER — THROMBIN 5000 UNITS EX SOLR
CUTANEOUS | Status: AC
  Filled 2015-09-20: qty 5000

## 2015-09-20 MED ORDER — HEPARIN SODIUM (PORCINE) 1000 UNIT/ML IJ SOLN
INTRAMUSCULAR | Status: DC | PRN
  Administered 2015-09-20: 3 mL via INTRAVENOUS
  Administered 2015-09-20: 2 mL via INTRAVENOUS

## 2015-09-20 MED ORDER — FENTANYL CITRATE (PF) 100 MCG/2ML IJ SOLN: 25.0000 ug | INTRAMUSCULAR | Status: DC | PRN

## 2015-09-20 MED ORDER — LIDOCAINE HCL 1 % IJ SOLN
INTRAMUSCULAR | Status: DC | PRN
  Administered 2015-09-20: 9 mL
  Administered 2015-09-20: 18 mL

## 2015-09-20 MED ORDER — MIDAZOLAM HCL 2 MG/2ML IJ SOLN
INTRAMUSCULAR | Status: AC
  Filled 2015-09-20: qty 2

## 2015-09-20 MED ORDER — FENTANYL CITRATE (PF) 100 MCG/2ML IJ SOLN
25.0000 ug | INTRAMUSCULAR | Status: DC | PRN
  Administered 2015-09-20: 25 ug via INTRAVENOUS

## 2015-09-20 MED ORDER — HEPARIN SODIUM (PORCINE) 1000 UNIT/ML IJ SOLN
1000.0000 [IU] | Freq: Once | INTRAMUSCULAR | Status: DC
  Filled 2015-09-20: qty 1

## 2015-09-20 MED ORDER — DEXTROSE 50 % IV SOLN
INTRAVENOUS | Status: DC | PRN
  Administered 2015-09-20: 12.5 via INTRAVENOUS

## 2015-09-20 MED ORDER — ONDANSETRON HCL 4 MG/2ML IJ SOLN: 4.0000 mg | Freq: Once | INTRAMUSCULAR | Status: DC | PRN

## 2015-09-20 MED ORDER — CEFAZOLIN SODIUM-DEXTROSE 2-4 GM/100ML-% IV SOLN
2.0000 g | INTRAVENOUS | Status: AC
  Administered 2015-09-20: 2 g via INTRAVENOUS

## 2015-09-20 MED ORDER — SODIUM CHLORIDE 0.9 % IV SOLN
INTRAVENOUS | Status: DC
  Administered 2015-09-20: 10:00:00 via INTRAVENOUS

## 2015-09-20 MED ORDER — HEPARIN SODIUM (PORCINE) 1000 UNIT/ML IJ SOLN
INTRAMUSCULAR | Status: AC
  Filled 2015-09-20: qty 3

## 2015-09-20 MED ORDER — BUPIVACAINE HCL (PF) 0.5 % IJ SOLN
INTRAMUSCULAR | Status: AC
  Filled 2015-09-20: qty 30

## 2015-09-20 MED ORDER — MIDAZOLAM HCL 2 MG/2ML IJ SOLN
1.0000 mg | INTRAMUSCULAR | Status: DC | PRN
  Administered 2015-09-20: 2 mg via INTRAVENOUS

## 2015-09-20 MED ORDER — LIDOCAINE HCL (PF) 1 % IJ SOLN
INTRAMUSCULAR | Status: AC
  Filled 2015-09-20: qty 30

## 2015-09-20 MED ORDER — DEXTROSE 50 % IV SOLN
INTRAVENOUS | Status: AC
  Filled 2015-09-20: qty 50

## 2015-09-20 MED ORDER — HEPARIN SOD (PORK) LOCK FLUSH 100 UNIT/ML IV SOLN
INTRAVENOUS | Status: AC
Start: 2015-09-20 — End: 2015-09-20
  Filled 2015-09-20: qty 5

## 2015-09-20 MED ORDER — PROPOFOL 500 MG/50ML IV EMUL
INTRAVENOUS | Status: DC | PRN
  Administered 2015-09-20: 15:00:00 via INTRAVENOUS
  Administered 2015-09-20: 25 ug/kg/min via INTRAVENOUS
  Administered 2015-09-20: 13:00:00 via INTRAVENOUS

## 2015-09-20 MED ORDER — HEPARIN SODIUM (PORCINE) 1000 UNIT/ML IJ SOLN
1600.0000 [IU] | Freq: Once | INTRAMUSCULAR | Status: AC
  Administered 2015-09-20: 1600 [IU] via INTRAVENOUS
  Filled 2015-09-20: qty 1.6

## 2015-09-20 MED ORDER — CHLORHEXIDINE GLUCONATE 4 % EX LIQD: 1.0000 "application " | Freq: Once | CUTANEOUS | Status: DC

## 2015-09-20 MED ORDER — HEPARIN SODIUM (PORCINE) 5000 UNIT/ML IJ SOLN
Freq: Once | INTRAMUSCULAR | Status: DC
  Filled 2015-09-20: qty 1.2

## 2015-09-20 SURGICAL SUPPLY — 44 items
BAG HAMPER (MISCELLANEOUS) ×3 IMPLANT
BNDG CONFORM 2 STRL LF (GAUZE/BANDAGES/DRESSINGS) ×3 IMPLANT
BNDG CONFORM 3 STRL LF (GAUZE/BANDAGES/DRESSINGS) ×3 IMPLANT
CATH EMB 2FR 60CM (CATHETERS) IMPLANT
CATH EMB 3FR 40CM (CATHETERS) IMPLANT
CATH EMB 4FR 40CM (CATHETERS) IMPLANT
CHLORAPREP W/TINT 26ML (MISCELLANEOUS) ×6 IMPLANT
CLIP TI MEDIUM 6 (CLIP) IMPLANT
CLIP TI WIDE RED SMALL 6 (CLIP) IMPLANT
CLOTH BEACON ORANGE TIMEOUT ST (SAFETY) ×3 IMPLANT
COVER LIGHT HANDLE STERIS (MISCELLANEOUS) ×6 IMPLANT
DECANTER SPIKE VIAL GLASS SM (MISCELLANEOUS) ×6 IMPLANT
DERMABOND ADVANCED (GAUZE/BANDAGES/DRESSINGS) ×2
DERMABOND ADVANCED .7 DNX12 (GAUZE/BANDAGES/DRESSINGS) ×1 IMPLANT
DRAPE ORTHO 2.5IN SPLIT 77X108 (DRAPES) ×1 IMPLANT
DRAPE ORTHO SPLIT 77X108 STRL (DRAPES) ×2
DRAPE PROXIMA HALF (DRAPES) ×6 IMPLANT
GAUZE SPONGE 4X4 16PLY XRAY LF (GAUZE/BANDAGES/DRESSINGS) ×3 IMPLANT
GLOVE BIOGEL PI IND STRL 7.0 (GLOVE) ×2 IMPLANT
GLOVE BIOGEL PI IND STRL 7.5 (GLOVE) ×1 IMPLANT
GLOVE BIOGEL PI INDICATOR 7.0 (GLOVE) ×4
GLOVE BIOGEL PI INDICATOR 7.5 (GLOVE) ×2
GLOVE ECLIPSE 6.5 STRL STRAW (GLOVE) ×3 IMPLANT
GLOVE ECLIPSE 7.0 STRL STRAW (GLOVE) ×3 IMPLANT
GOWN STRL REUS W/TWL LRG LVL3 (GOWN DISPOSABLE) ×9 IMPLANT
HEMOSTAT SURGICEL 4X8 (HEMOSTASIS) ×6 IMPLANT
IV CATH 18G SAFETY (IV SOLUTION) ×3 IMPLANT
KIT BLADEGUARD II DBL (SET/KITS/TRAYS/PACK) ×3 IMPLANT
KIT ROOM TURNOVER APOR (KITS) ×3 IMPLANT
MANIFOLD NEPTUNE II (INSTRUMENTS) ×3 IMPLANT
MARKER SKIN DUAL TIP RULER LAB (MISCELLANEOUS) ×6 IMPLANT
PACK CV ACCESS (CUSTOM PROCEDURE TRAY) ×3 IMPLANT
PAD ARMBOARD 7.5X6 YLW CONV (MISCELLANEOUS) ×3 IMPLANT
SET BASIN LINEN APH (SET/KITS/TRAYS/PACK) ×3 IMPLANT
SPONGE SURGIFOAM ABS GEL 100 (HEMOSTASIS) IMPLANT
SUT PROLENE 6 0 C 1 30 (SUTURE) ×6 IMPLANT
SUT PROLENE 7 0 BV 1 (SUTURE) IMPLANT
SUT VIC AB 3-0 SH 27 (SUTURE) ×2
SUT VIC AB 3-0 SH 27X BRD (SUTURE) ×1 IMPLANT
SUT VIC AB 4-0 PS2 27 (SUTURE) ×6 IMPLANT
SYRINGE 10CC LL (SYRINGE) ×3 IMPLANT
VESSEL LOOPS MAXI  LUE (MISCELLANEOUS) ×3 IMPLANT
VESSEL LOOPS MAXI RED (MISCELLANEOUS) ×3 IMPLANT
Vascular Graft Wall Stretch 4-7mm ×3 IMPLANT

## 2015-09-20 NOTE — Discharge Instructions (Addendum)
In addition to included general post-operative instructions for Placement of Arterio-Venous Graft for Hemodialysis Access,  Diet: Resume home heart healthy Renal diet.   Activity: No heavy lifting (children, pets, laundry) or strenuous activity until follow-up, but light activity and walking are encouraged. Do not drive or drink alcohol if taking narcotic pain medications.   Wound care: 2 days after surgery (Sunday, 7/30), may get incision wet with soapy water and pat dry (do not rub incisions), but keep tunneled Right IJ hemodialysis catheter and its dressing clean and dry with no showers and no baths or submerging incision underwater until follow-up.  Medications: Resume all home medications. For mild to moderate pain: acetaminophen (Tylenol). Narcotic pain medications, if prescribed, can be used for severe pain, though may cause nausea, constipation, and drowsiness. Do not combine Tylenol and Percocet within a 6 hour period as Percocet contains Tylenol.  Call office [phone]) at any time if any questions, worsening pain, fevers/chills, bleeding, drainage from incision site, or other concerns.

## 2015-09-20 NOTE — Anesthesia Postprocedure Evaluation (Signed)
Anesthesia Post Note  Patient: Courtney [name]  Procedure(s) Performed: Procedure(s) (LRB): PLACEMENT OF RIGHT UPPER EXTREMITY ARTERIOVENOUS GORE-TEX GRAFT FOR HEMODIALYSIS ACCESS (Right)  Patient location during evaluation: PACU Anesthesia Type: MAC Level of consciousness: awake and patient cooperative Pain management: pain level controlled Vital Signs Assessment: post-procedure vital signs reviewed and stable Respiratory status: spontaneous breathing, nonlabored ventilation and respiratory function stable Cardiovascular status: blood pressure returned to baseline Postop Assessment: no signs of nausea or vomiting Anesthetic complications: no    Last Vitals:  Vitals:   09/20/15 1600 09/20/15 1615  BP: (!) 100/45 107/67  Pulse: (!) 47 (!) 50  Resp: 19 17  Temp:      Last Pain:  Vitals:   09/20/15 1554  TempSrc:   PainSc: Asleep                 Heru Montz J

## 2015-09-20 NOTE — Interval H&P Note (Signed)
History and Physical Interval Note:  09/20/2015 10:14 AM  Courtney [name]  has presented today for surgery, with the diagnosis of end stage renal disease  The various methods of treatment have been discussed with the patient and family. After consideration of risks, benefits and other options for treatment, the patient has consented to  Procedure(s): PLACEMENT OF RIGHT UPPER EXTREMITY ARTERIOVENOUS GORE-TEX GRAFT FOR HEMODIALYSIS ACCESS (Right) as a surgical intervention .  The patient's history has been reviewed, patient examined, no change in status, stable for surgery.  I have reviewed the patient's chart and labs.  Questions were answered to the patient's satisfaction.     Vickie Epley

## 2015-09-20 NOTE — Transfer of Care (Signed)
Immediate Anesthesia Transfer of Care Note  Patient: Courtney [name]  Procedure(s) Performed: Procedure(s): PLACEMENT OF RIGHT UPPER EXTREMITY ARTERIOVENOUS GORE-TEX GRAFT FOR HEMODIALYSIS ACCESS (Right)  Patient Location: PACU  Anesthesia Type:MAC  Level of Consciousness: sedated  Airway & Oxygen Therapy: Patient Spontanous Breathing and Patient connected to face mask oxygen  Post-op Assessment: Report given to RN and Post -op Vital signs reviewed and stable  Post vital signs: Reviewed and stable  Last Vitals:  Vitals:   09/20/15 0939  Pulse: 74  Temp: 36.7 C    Last Pain:  Vitals:   09/20/15 0939  TempSrc: Oral         Complications: No apparent anesthesia complications

## 2015-09-20 NOTE — Anesthesia Preprocedure Evaluation (Signed)
Anesthesia Evaluation  Patient identified by MRN, date of birth, ID band Patient awake    Reviewed: Allergy & Precautions, H&P , NPO status , Patient's Chart, lab work & pertinent test results  Airway Mallampati: II  TM Distance: >3 FB Neck ROM: Full    Dental  (+) Edentulous Upper, Edentulous Lower   Pulmonary neg pulmonary ROS,    breath sounds clear to auscultation       Cardiovascular hypertension, Pt. on medications  Rhythm:Regular Rate:Normal     Neuro/Psych CVA, Residual Symptoms    GI/Hepatic   Endo/Other  diabetes, Type 2, Oral Hypoglycemic Agents  Renal/GU ESRFRenal disease     Musculoskeletal   Abdominal   Peds  Hematology   Anesthesia Other Findings   Reproductive/Obstetrics                             Anesthesia Physical Anesthesia Plan  ASA: III  Anesthesia Plan: MAC   Post-op Pain Management:    Induction: Intravenous  Airway Management Planned: Simple Face Mask  Additional Equipment:   Intra-op Plan:   Post-operative Plan:   Informed Consent: I have reviewed the patients History and Physical, chart, labs and discussed the procedure including the risks, benefits and alternatives for the proposed anesthesia with the patient or authorized representative who has indicated his/her understanding and acceptance.     Plan Discussed with:   Anesthesia Plan Comments:         Anesthesia Quick Evaluation

## 2015-09-20 NOTE — Op Note (Signed)
SURGICAL OPERATIVE REPORT   DATE OF PROCEDURE: 09/20/2015   ATTENDING SURGEON: Corene Cornea E. Rosana Hoes, MD  ANESTHESIA: Local with light IV sedation   PRE-OPERATIVE DIAGNOSIS: End-stage kidney disease requiring durable hemodialysis access   POST-OPERATIVE DIAGNOSIS: End-stage kidney disease requiring durable hemodialysis access   PROCEDURE(S):  1.) Placement of RUE brachial-axillary AV Graft for durable hemodialysis access   INTRAOPERATIVE FINDINGS: Brachial artery and brachial-axillary vein suitable for placement of AV Graft   INTRAOPERATIVE FLUIDS: 75 mL crystalloid   ESTIMATED BLOOD LOSS: 75 mL   URINE OUTPUT: No foley   SPECIMENS: None   IMPLANTS: Gore 4 mm - 7 mm Propaten heparin-bonded PTFE graft   DRAINS: None   COMPLICATIONS: None apparent   CONDITION AT COMPLETION: Hemodynamically stable and awake   PULSE/DOPPLER EXAM AT END OF PROCEDURE:   (p=palpable; d=doppler signals; 0=none)      Right  AVG  Easily palpable non-pulsatile thrill   Brach   palpable   Rad      weakly palpable  DISPOSITION: PACU   INDICATION(S) FOR PROCEDURE:  Patient is a Left-handed 75 y.o. female with end-stage renal disease currently undergoing hemodialysis via a Right IJ tunneled catheter, requiring durable hemodialysis access. Vein mapping suggested no suitable veins for creation of an autogenous AV fistula in either arm. Bilateral upper extremity duplex vein mapping was repeated upon administration of anesthesia with essentially the same findings. All risks, benefits, and alternatives to above elective procedures were discussed with the patient and patient's family, who elected to proceed, and informed consent was accordingly obtained at that time.   DETAILS OF PROCEDURE: Patient was brought to the operating suite and appropriately identified. IV sedation was administered along with prophylactic peri-operative IV antibiotics, and light sedation was administered by anesthesiologist. In supine  position, operative site was prepped and draped in the usual sterile fashion, and following a brief time out, an initial 2 - 3 cm longitudinal skin incision over the axial vein using a #15 blade scalpel and extended deep through subcutaneous tissues. Brachial-axillary vein was identified and found to be suitable for placement of Brachial artery to Axillary vein PTFE AV graft, and vein was encircled with vessel loops. Attention was then directed to exposure of the brachial artery with fascia opened using electrocaudery and sharp dissection, which were used to circumferentially dissect the visualized brachial artery free from surrounding tissues, and silastic vessel loops were used to encircle the brachial artery, which was confirmed to be soft, pulsatile, and suitable for placement of a brachial artery to axillary vein AV graft. Graft tunnelers were selected and used to mark the course of the arm graft using selected 4 mm - 7 mm tapered heparin-bonded PTFE graft, A subcutaneous tunnel from the antecubital incision to the axilla was created, through which PTFE graft was passed taking care to avoid any kinks or twisting of the graft.   3000 Units of heparin was administered intravenously by anesthesiologist, and silastic vessel loops and atraumatic vascular clamps were used to secure proximal and distal control of the artery. Brachial arteriotomy was made using a #11 blade scalpel and extended proximally and distally using Potts scissors. Coronary dilators were passed antegrade and retrograde without any resistance and dilated appropriately with brisk inflow. Artery was then flushed with heparinized saline, and running continuous anastomosis was then completed using 6-0 Prolene suture. Hemostasis was confirmed, and attention was directed to the axillary vein, in which an anterior venotomy was created using a #11 blade and extended proximally and distally using  Potts scissors. Coronary dilators were passed similar to  as described for the artery, and anastomosis was performed as described above using 6-0 Gore suture. Prior to completion of the anastomosis, outflow was backward bled and inflow was allowed to forward bleed, anastomosis was flushed with heparinized saline, and anastomotic sutures were tied. Hemostasis was achieved with topical hemostatic agents as needed. All instrument and sponge counts were confirmed, and incisions were reapproximated in layers with interrupted 3-0 Vicryl suture for soft tissue, followed by running subcuticular 4-0 Monocryl suture for skin. Skin was then cleaned, dried, and sterile skin glue was applied. Patient was then safely able to be awakened and transferred to PACU for post-operative monitoring and care.   I was present for all aspects of procedure, and there were no intraoperative complications apparent.

## 2015-09-20 NOTE — H&P (Signed)
SURGICAL HISTORY & PHYSICAL   HISTORY OF PRESENT ILLNESS (HPI):  75 y.o. female presented for post-operative follow-up s/p insertion of Right IJ tunneled hemodialysis catheter. Patient had no  significant complaints, but hemodialysis clinic reported difficulty using drawing from the venous port.Patient denies any pain, fever/chills, unilateral swelling of an extremity, SOB, or CP. Catheter was repositioned and easily drawn from and flushed with sterile technique in the office. Patient then underwent hemodialysis yesterday without any reported catheter difficulty.  PAST MEDICAL HISTORY (PMH):  Past Medical History:  Diagnosis Date  . Diabetes mellitus without complication ([place])   . Hypertension   . Stroke [facility])    2-3 yrs ago- right sided weakness    Reviewed. Otherwise negative.   PAST SURGICAL HISTORY ([place]):  Past Surgical History:  Procedure Laterality Date  . APPENDECTOMY    . CATARACT EXTRACTION W/PHACO Left 06/29/2013   Procedure: CATARACT EXTRACTION PHACO AND INTRAOCULAR LENS PLACEMENT (IOC);  Surgeon: Tonny Branch, MD;  Location: [facility];  Service: Ophthalmology;  Laterality: Left;  CDE 12.25  . CATARACT EXTRACTION W/PHACO Right 07/27/2013   Procedure: CATARACT EXTRACTION PHACO AND INTRAOCULAR LENS PLACEMENT (IOC);  Surgeon: Tonny Branch, MD;  Location: [facility];  Service: Ophthalmology;  Laterality: Right;  CDE:7.72  . INSERTION OF DIALYSIS CATHETER Right 09/11/2015   Procedure: INSERTION OF TUNNELED DIALYSIS CATHETER;  Surgeon: Vickie Epley, MD;  Location: [facility];  Service: Vascular;  Laterality: Right;  . INTRAMEDULLARY (IM) NAIL INTERTROCHANTERIC Right 07/20/2012   Procedure: INTRAMEDULLARY (IM) NAIL INTERTROCHANTRIC;  Surgeon: Carole Civil, MD;  Location: [facility];  Service: Orthopedics;  Laterality: Right;    Reviewed. Otherwise negative.   MEDICATIONS:  Prior to Admission medications   Medication Sig Start Date End Date Taking? Authorizing Provider  aspirin EC 81 MG  tablet Take 81 mg by mouth daily.   Yes Historical Provider, MD  atorvastatin (LIPITOR) 20 MG tablet Take 20 mg by mouth daily.   Yes Historical Provider, MD  carvedilol (COREG) 6.25 MG tablet Take 6.25 mg by mouth 2 (two) times daily with a meal.   Yes Historical Provider, MD  hydrALAZINE (APRESOLINE) 25 MG tablet Take 25 mg by mouth 3 (three) times daily. 07/22/12  Yes Kathie Dike, MD  isosorbide mononitrate (IMDUR) 30 MG 24 hr tablet Take 30 mg by mouth daily.   Yes Historical Provider, MD  loperamide (IMODIUM A-D) 2 MG tablet Take 2 mg by mouth every 6 (six) hours as needed for diarrhea or loose stools.   Yes Historical Provider, MD  Multiple Vitamin (MULTIVITAMIN WITH MINERALS) TABS tablet Take 1 tablet by mouth daily.   Yes Historical Provider, MD  sodium bicarbonate 650 MG tablet Take 650 mg by mouth 3 (three) times daily.   Yes Historical Provider, MD     ALLERGIES:  Allergies  Allergen Reactions  . Grapefruit Flavor [Flavoring Agent]     Reaction unknown     SOCIAL HISTORY:  Social History   Social History  . Marital status: Single    Spouse name: N/A  . Number of children: N/A  . Years of education: N/A   Occupational History  . Not on file.   Social History Main Topics  . Smoking status: Never Smoker  . Smokeless tobacco: Never Used  . Alcohol use No  . Drug use: No  . Sexual activity: No   Other Topics Concern  . Not on file   Social History Narrative  . No narrative on file    The  patient currently resides (home / rehab facility / nursing home): Nursing facility  The patient normally is (ambulatory / bedbound): Minimally to non-ambulatory   FAMILY HISTORY:  Family History  Problem Relation Age of Onset  . Heart disease Mother   . Heart disease Father     Otherwise negative.   REVIEW OF SYSTEMS:  Constitutional: denies any other weight loss, fever, chills, or sweats  Eyes: denies any other vision changes, history of eye injury  ENT: denies sore  throat, hearing problems  Respiratory: denies shortness of breath, wheezing  Cardiovascular: denies chest pain, palpitations  Gastrointestinal: denies N/V, diarrhea  Musculoskeletal: denies any other joint pains or cramps  Skin: Denies any other rashes or skin discolorations  Neurological: denies any other headache, dizziness, weakness  Psychiatric: denies any other depression, anxiety   All other review of systems were otherwise negative.  VITAL SIGNS:  Temp:  [98 F (36.7 C)] 98 F (36.7 C) (07/28 0939) Pulse Rate:  [74] 74 (07/28 0939)             INTAKE/OUTPUT:  This shift: No intake/output data recorded.  Last 2 shifts: @IOLAST2SHIFTS @  PHYSICAL EXAM:  Constitutional:  -- Normal, albeit frail/thin body habitus  -- Awake, alert, and oriented x3  Eyes:  -- Pupils equally round and reactive to light  -- No scleral icterus  Ear, nose, throat:  -- No jugular venous distension  Pulmonary:  -- No crackles  -- Equal breath sounds bilaterally  Cardiovascular:  -- S1, S2 present  -- No pericardial rubs  Gastrointestinal:  -- Soft, nontender, nondistended, no guarding/rebound  -- No abdominal masses appreciated, pulsatile or otherwise  Musculoskeletal / Integumentary:  -- Wounds or skin discoloration: Tunneled Right IJ hemodialysis catheter well-secured without peri-catheter erythema or drainage, catheter c/d/i -- Extremities: B/L UE and LE FROM, hands and feet warm, no edema  Neurologic:  -- Motor function: Intact and symmetric -- Sensation: Intact and symmetric  Pulse/Doppler Exam: (p=palpable; d=doppler signals; 0=none)    Right   Left   Brach  p   p   Rad  p   p   Labs:  CBC:  Lab Results  Component Value Date   WBC 5.9 09/20/2015   RBC 2.89 (L) 09/20/2015   BMP:  Lab Results  Component Value Date   GLUCOSE 88 09/20/2015   CO2 28 09/20/2015   BUN 12 09/20/2015   CREATININE 2.41 (H) 09/20/2015   CALCIUM 8.3 (L) 09/20/2015     Imaging studies:  B/L  upper extremity vein mapping (09/13/2015) - personally reviewed beyond the scope of radiology interpretation Bilateral upper extremity veins not suitable for creation of autogenous arteriovenous fistula for durable hemodialysis access  Assessment/Plan:  75 y.o. female with ESRD doing overall well s/p insertion of tunneled Right IJ hemodialysis catheter, requiring more durable (non-catheter) hemodialysis access, complicated by pertinent comorbidities including DM, HTN, and a history of stroke.    - all risks, benefits, and alternatives to placement of arteriovenous graft for hemodialysis access were explained and discussed with the patient and her family (daughter) and caregivers, and all of their questions were answered to their expressed satisfaction, and informed consent was obtained   - patient marked, informed consent confirmed in chart   - DVT prophylaxis  All of the above findings and recommendations were discussed with the patient and her family, and all of her and family's questions were answered to their expressed satisfaction.  -- Marilynne Drivers Rosana Hoes, MD, [place]: [place]  [facility] Office: [phone]

## 2015-09-21 DIAGNOSIS — D509 Iron deficiency anemia, unspecified: Secondary | ICD-10-CM | POA: Diagnosis not present

## 2015-09-21 DIAGNOSIS — Z23 Encounter for immunization: Secondary | ICD-10-CM | POA: Diagnosis not present

## 2015-09-21 DIAGNOSIS — Z992 Dependence on renal dialysis: Secondary | ICD-10-CM | POA: Diagnosis not present

## 2015-09-21 DIAGNOSIS — N186 End stage renal disease: Secondary | ICD-10-CM | POA: Diagnosis not present

## 2015-09-21 DIAGNOSIS — N2581 Secondary hyperparathyroidism of renal origin: Secondary | ICD-10-CM | POA: Diagnosis not present

## 2015-09-21 DIAGNOSIS — D631 Anemia in chronic kidney disease: Secondary | ICD-10-CM | POA: Diagnosis not present

## 2015-09-21 NOTE — Progress Notes (Signed)
Late entry: 09/20/15 @ 1815 Report called to [place] center LPN receiving patient. Discharge instructions printed after Dr. Rosana Hoes entered. Prescription and instructions given to transporter from [facility]. Pt transported via wheelchair in stable condition. Family updated.

## 2015-09-23 MED FILL — Heparin Sodium (Porcine) Inj 5000 Unit/ML: INTRAMUSCULAR | Qty: 6000 | Status: AC

## 2015-09-24 ENCOUNTER — Other Ambulatory Visit (HOSPITAL_COMMUNITY): Payer: Self-pay | Admitting: Nephrology

## 2015-09-24 DIAGNOSIS — Z79818 Long term (current) use of other agents affecting estrogen receptors and estrogen levels: Secondary | ICD-10-CM | POA: Diagnosis not present

## 2015-09-24 DIAGNOSIS — D649 Anemia, unspecified: Secondary | ICD-10-CM | POA: Diagnosis not present

## 2015-09-24 DIAGNOSIS — Z7982 Long term (current) use of aspirin: Secondary | ICD-10-CM | POA: Diagnosis not present

## 2015-09-24 DIAGNOSIS — R2689 Other abnormalities of gait and mobility: Secondary | ICD-10-CM | POA: Diagnosis not present

## 2015-09-24 DIAGNOSIS — Z992 Dependence on renal dialysis: Secondary | ICD-10-CM | POA: Diagnosis not present

## 2015-09-24 DIAGNOSIS — I120 Hypertensive chronic kidney disease with stage 5 chronic kidney disease or end stage renal disease: Secondary | ICD-10-CM | POA: Diagnosis not present

## 2015-09-24 DIAGNOSIS — R1312 Dysphagia, oropharyngeal phase: Secondary | ICD-10-CM | POA: Diagnosis not present

## 2015-09-24 DIAGNOSIS — E440 Moderate protein-calorie malnutrition: Secondary | ICD-10-CM | POA: Diagnosis not present

## 2015-09-24 DIAGNOSIS — I69354 Hemiplegia and hemiparesis following cerebral infarction affecting left non-dominant side: Secondary | ICD-10-CM | POA: Diagnosis not present

## 2015-09-24 DIAGNOSIS — Z452 Encounter for adjustment and management of vascular access device: Secondary | ICD-10-CM | POA: Diagnosis present

## 2015-09-24 DIAGNOSIS — I132 Hypertensive heart and chronic kidney disease with heart failure and with stage 5 chronic kidney disease, or end stage renal disease: Secondary | ICD-10-CM | POA: Diagnosis not present

## 2015-09-24 DIAGNOSIS — I5032 Chronic diastolic (congestive) heart failure: Secondary | ICD-10-CM | POA: Diagnosis not present

## 2015-09-24 DIAGNOSIS — Z8249 Family history of ischemic heart disease and other diseases of the circulatory system: Secondary | ICD-10-CM | POA: Diagnosis not present

## 2015-09-24 DIAGNOSIS — R278 Other lack of coordination: Secondary | ICD-10-CM | POA: Diagnosis not present

## 2015-09-24 DIAGNOSIS — E1122 Type 2 diabetes mellitus with diabetic chronic kidney disease: Secondary | ICD-10-CM | POA: Diagnosis not present

## 2015-09-24 DIAGNOSIS — Z86718 Personal history of other venous thrombosis and embolism: Secondary | ICD-10-CM | POA: Diagnosis not present

## 2015-09-24 DIAGNOSIS — N186 End stage renal disease: Secondary | ICD-10-CM

## 2015-09-24 DIAGNOSIS — E785 Hyperlipidemia, unspecified: Secondary | ICD-10-CM | POA: Diagnosis not present

## 2015-09-24 DIAGNOSIS — R41841 Cognitive communication deficit: Secondary | ICD-10-CM | POA: Diagnosis not present

## 2015-09-24 DIAGNOSIS — Z4901 Encounter for fitting and adjustment of extracorporeal dialysis catheter: Secondary | ICD-10-CM | POA: Diagnosis not present

## 2015-09-24 DIAGNOSIS — E876 Hypokalemia: Secondary | ICD-10-CM | POA: Diagnosis not present

## 2015-09-24 DIAGNOSIS — Z23 Encounter for immunization: Secondary | ICD-10-CM | POA: Diagnosis not present

## 2015-09-24 DIAGNOSIS — Z91018 Allergy to other foods: Secondary | ICD-10-CM | POA: Diagnosis not present

## 2015-09-24 DIAGNOSIS — Z833 Family history of diabetes mellitus: Secondary | ICD-10-CM | POA: Diagnosis not present

## 2015-09-24 DIAGNOSIS — N184 Chronic kidney disease, stage 4 (severe): Secondary | ICD-10-CM | POA: Diagnosis not present

## 2015-09-24 DIAGNOSIS — D509 Iron deficiency anemia, unspecified: Secondary | ICD-10-CM | POA: Diagnosis not present

## 2015-09-24 DIAGNOSIS — Z79899 Other long term (current) drug therapy: Secondary | ICD-10-CM | POA: Diagnosis not present

## 2015-09-24 DIAGNOSIS — N189 Chronic kidney disease, unspecified: Secondary | ICD-10-CM | POA: Diagnosis not present

## 2015-09-24 DIAGNOSIS — Z681 Body mass index (BMI) 19 or less, adult: Secondary | ICD-10-CM | POA: Diagnosis not present

## 2015-09-24 DIAGNOSIS — D631 Anemia in chronic kidney disease: Secondary | ICD-10-CM | POA: Diagnosis not present

## 2015-09-24 DIAGNOSIS — Z780 Asymptomatic menopausal state: Secondary | ICD-10-CM | POA: Diagnosis not present

## 2015-09-24 DIAGNOSIS — I5022 Chronic systolic (congestive) heart failure: Secondary | ICD-10-CM | POA: Diagnosis not present

## 2015-09-24 DIAGNOSIS — B952 Enterococcus as the cause of diseases classified elsewhere: Secondary | ICD-10-CM | POA: Diagnosis not present

## 2015-09-24 DIAGNOSIS — I69351 Hemiplegia and hemiparesis following cerebral infarction affecting right dominant side: Secondary | ICD-10-CM | POA: Diagnosis not present

## 2015-09-24 DIAGNOSIS — N2581 Secondary hyperparathyroidism of renal origin: Secondary | ICD-10-CM | POA: Diagnosis not present

## 2015-09-24 DIAGNOSIS — R531 Weakness: Secondary | ICD-10-CM | POA: Diagnosis not present

## 2015-09-24 DIAGNOSIS — I130 Hypertensive heart and chronic kidney disease with heart failure and stage 1 through stage 4 chronic kidney disease, or unspecified chronic kidney disease: Secondary | ICD-10-CM | POA: Diagnosis not present

## 2015-09-24 DIAGNOSIS — N390 Urinary tract infection, site not specified: Secondary | ICD-10-CM | POA: Diagnosis not present

## 2015-09-25 ENCOUNTER — Other Ambulatory Visit (HOSPITAL_COMMUNITY): Payer: Self-pay | Admitting: Nephrology

## 2015-09-25 ENCOUNTER — Encounter (HOSPITAL_COMMUNITY): Payer: Self-pay

## 2015-09-25 ENCOUNTER — Ambulatory Visit (HOSPITAL_COMMUNITY)
Admission: RE | Admit: 2015-09-25 | Discharge: 2015-09-25 | Disposition: A | Discharge status: Home / Self Care | Payer: Medicare Other | Source: Ambulatory Visit | Attending: Nephrology | Admitting: Nephrology

## 2015-09-25 ENCOUNTER — Other Ambulatory Visit: Payer: Self-pay | Admitting: Radiology

## 2015-09-25 DIAGNOSIS — I5032 Chronic diastolic (congestive) heart failure: Secondary | ICD-10-CM | POA: Diagnosis not present

## 2015-09-25 DIAGNOSIS — I69351 Hemiplegia and hemiparesis following cerebral infarction affecting right dominant side: Secondary | ICD-10-CM | POA: Insufficient documentation

## 2015-09-25 DIAGNOSIS — E1122 Type 2 diabetes mellitus with diabetic chronic kidney disease: Secondary | ICD-10-CM | POA: Diagnosis not present

## 2015-09-25 DIAGNOSIS — I120 Hypertensive chronic kidney disease with stage 5 chronic kidney disease or end stage renal disease: Secondary | ICD-10-CM | POA: Insufficient documentation

## 2015-09-25 DIAGNOSIS — I132 Hypertensive heart and chronic kidney disease with heart failure and with stage 5 chronic kidney disease, or end stage renal disease: Secondary | ICD-10-CM | POA: Diagnosis not present

## 2015-09-25 DIAGNOSIS — N186 End stage renal disease: Secondary | ICD-10-CM | POA: Diagnosis not present

## 2015-09-25 DIAGNOSIS — E440 Moderate protein-calorie malnutrition: Secondary | ICD-10-CM | POA: Diagnosis not present

## 2015-09-25 DIAGNOSIS — Z7982 Long term (current) use of aspirin: Secondary | ICD-10-CM | POA: Diagnosis not present

## 2015-09-25 DIAGNOSIS — D649 Anemia, unspecified: Secondary | ICD-10-CM | POA: Diagnosis not present

## 2015-09-25 DIAGNOSIS — R531 Weakness: Secondary | ICD-10-CM | POA: Diagnosis not present

## 2015-09-25 DIAGNOSIS — Z452 Encounter for adjustment and management of vascular access device: Secondary | ICD-10-CM | POA: Insufficient documentation

## 2015-09-25 DIAGNOSIS — D631 Anemia in chronic kidney disease: Secondary | ICD-10-CM | POA: Diagnosis not present

## 2015-09-25 DIAGNOSIS — Z8249 Family history of ischemic heart disease and other diseases of the circulatory system: Secondary | ICD-10-CM | POA: Insufficient documentation

## 2015-09-25 DIAGNOSIS — N184 Chronic kidney disease, stage 4 (severe): Secondary | ICD-10-CM | POA: Diagnosis not present

## 2015-09-25 DIAGNOSIS — Z992 Dependence on renal dialysis: Secondary | ICD-10-CM | POA: Insufficient documentation

## 2015-09-25 DIAGNOSIS — Z4901 Encounter for fitting and adjustment of extracorporeal dialysis catheter: Secondary | ICD-10-CM | POA: Diagnosis not present

## 2015-09-25 HISTORY — PX: IR GENERIC HISTORICAL: IMG1180011

## 2015-09-25 LAB — GLUCOSE, CAPILLARY: GLUCOSE-CAPILLARY: 83 mg/dL (ref 65–99)

## 2015-09-25 MED ORDER — SODIUM CHLORIDE 0.9 % IV SOLN: INTRAVENOUS | Status: DC

## 2015-09-25 MED ORDER — FENTANYL CITRATE (PF) 100 MCG/2ML IJ SOLN
INTRAMUSCULAR | Status: AC | PRN
  Administered 2015-09-25: 25 ug via INTRAVENOUS

## 2015-09-25 MED ORDER — HEPARIN SODIUM (PORCINE) 1000 UNIT/ML IJ SOLN
INTRAMUSCULAR | Status: AC
  Administered 2015-09-25: 3.2 [IU]
  Filled 2015-09-25: qty 1

## 2015-09-25 MED ORDER — CHLORHEXIDINE GLUCONATE 4 % EX LIQD
CUTANEOUS | Status: DC
Start: 2015-09-25 — End: 2015-09-26
  Filled 2015-09-25: qty 15

## 2015-09-25 MED ORDER — MIDAZOLAM HCL 2 MG/2ML IJ SOLN
INTRAMUSCULAR | Status: AC
  Filled 2015-09-25: qty 2

## 2015-09-25 MED ORDER — MIDAZOLAM HCL 2 MG/2ML IJ SOLN
INTRAMUSCULAR | Status: AC | PRN
  Administered 2015-09-25: 1 mg via INTRAVENOUS

## 2015-09-25 MED ORDER — CEFAZOLIN SODIUM-DEXTROSE 2-4 GM/100ML-% IV SOLN
2.0000 g | INTRAVENOUS | Status: AC
  Administered 2015-09-25: 2 g via INTRAVENOUS

## 2015-09-25 MED ORDER — FENTANYL CITRATE (PF) 100 MCG/2ML IJ SOLN
INTRAMUSCULAR | Status: AC
  Filled 2015-09-25: qty 2

## 2015-09-25 MED ORDER — CEFAZOLIN SODIUM-DEXTROSE 2-4 GM/100ML-% IV SOLN
INTRAVENOUS | Status: AC
  Filled 2015-09-25: qty 100

## 2015-09-25 MED ORDER — LIDOCAINE HCL 1 % IJ SOLN
INTRAMUSCULAR | Status: AC
  Administered 2015-09-25: 10 mL
  Filled 2015-09-25: qty 20

## 2015-09-25 NOTE — Sedation Documentation (Signed)
Pt is resting at this time, no complaints 

## 2015-09-25 NOTE — Progress Notes (Signed)
Dialysis center called to state pt has 5.7 Hgb.  This result is from 09/24/2015.  Jannifer Franklin notified .  Regular MD wants pt sent to ER.  Jannifer Franklin notified and [facility] at [facility] notified and discharge instructions given.  PA states OK to leave IV in for ER since pt is a very difficult stick.  [place] transportation to take pt to [facility] ER.  Pt and daughter understand instructions.

## 2015-09-25 NOTE — Sedation Documentation (Signed)
Patient is resting comfortably. No sedation at this time.

## 2015-09-25 NOTE — Sedation Documentation (Signed)
Vital signs stable. 

## 2015-09-25 NOTE — Sedation Documentation (Signed)
Pt complains of pain 

## 2015-09-25 NOTE — Procedures (Signed)
Successful removal of existing R IJ HD Catheter and placement of a new R IJ approach tunneled HD catheter with tips terminating within the superior aspect of the right atrium.    EBL: Minimal   The patient tolerated the procedure well without immediate post procedural complication.   The catheter is ready for immediate use.   Ronny Bacon, MD Pager #: [phone]

## 2015-09-25 NOTE — Discharge Instructions (Signed)
Tunneled Catheter Insertion Catheters are thin, flexible tubes that are inserted into a vein to provide access to the bloodstream. A tunneled catheter is used when a person's bloodstream needs to be accessed many times over a long period, usually longer than 30 days. The catheter provides a painless method of drawing blood, giving blood products, removing waste products from the blood (hemodialysis), and giving medicines. Tunneled catheters can be placed in different parts of the body depending on how they will be used. These catheters are secure and easy to access. A part of the catheter is tunneled under the skin. This is done to decrease the risk of infection.  There are various types of tunneled catheters. The specific one used will depend on your needs. The catheter can be used right after insertion. LET [facility] CARE PROVIDER KNOW ABOUT:   Any allergies you have.  All medicines you are taking, including vitamins, herbs, eyedrops, and over-the-counter medicines and creams.   Previous problems you or members of your family have had with the use of anesthetics.   Any blood disorders you have had.  Possibility of pregnancy, if this applies.   Other health problems you have. Also, let your health care provider know if you have a pacemaker. RISKS AND COMPLICATIONS Generally, tunneled catheter insertion is a safe procedure. However, as with any surgical procedure, complications can occur. Possible complications include:  Damage to the blood vessel.   Bruising or bleeding at the site of puncture.   Introduction of the catheter into an artery instead of a vein.   Skin infection at the site of catheter insertion.   Bloodstream infection, especially if your white blood cell count is low.   Developing a kink in the catheter so it does not work properly.   Developing a hole or crack in the catheter.   Blockage of the catheter.   Getting air in the catheter.  Blood clots  around the catheter or in the vein near the catheter.  Disturbance in the normal heart rhythm (rare). This is usually temporary.   A collapsed lung during insertion (rare).  BEFORE THE PROCEDURE   You may need to have blood tests done before the day of the procedure.   Do not eat or drink anything for at least 8 hours before the procedure or as directed by your health care provider.   Ask your health care provider about changing or stopping your regular medicines.  Avoid wearing jewelry the day of the procedure.   Make plans to have someone drive you home after the procedure. You should not drive immediately after the procedure.  PROCEDURE  You will be asked to lie on your back.  A regular intravenous (IV) access tube may be put into a vein in your hand or arm. During the procedure, medicine can flow directly into your body through the IV tube.  Small monitors will be put on your body. They are used to check your heart, blood pressure, and oxygen level.  The catheter site is usually shaved, cleaned, and covered with a sterile drape.  You will be given medicine to numb the area where the catheter will be placed (local anesthetic). You may also be given a medicine to help you relax (sedative).  The health care provider will then make a small incision in the skin, usually in the lower neck. Another small incision is made a little lower, usually on the shoulder or upper chest. Ultrasonography may be used so that the health  care provider can see the vein and can properly guide the catheter placement.  X-ray equipment may also be used to help ensure that the catheter is inserted safely and is placed where it will function most effectively. This equipment allows the health care provider to watch the catheter on a live display while guiding it into place.  Generally, a small guidewire is put into the vein first. A tunnel is created under the skin. The health care provider guides the  movement of the guidewire into a larger vein closer to the heart.  The catheter is pulled through the tunnel and then moved into the larger vein. The cuff on the catheter is located in the tunnel part. The cuff helps to anchor the catheter in place over time. Stitches are used to keep the catheter in place when first put in.  An X-ray may be done to make sure the catheter is in the right place. AFTER THE PROCEDURE   You may stay in a recovery area until the sedation has worn off.  Your heart rate, blood pressure and oxygen level will be monitored.  You may have some pain and swelling in the neck or shoulder. You will likely be given medicine to control this.  If this was done as an outpatient procedure, you may be able to go home the same day.   This information is not intended to replace advice given to you by your health care provider. Make sure you discuss any questions you have with your health care provider.   Document Released: 03/01/2007 Document Revised: 03/02/2014 Document Reviewed: 12/23/2011 Elsevier Interactive Patient Education [employer].

## 2015-09-25 NOTE — Progress Notes (Signed)
[place] transportation here to transport pt.  Daughter understands all instructions.  Family glad pt is going to [employer] [facility].  IV in place for transport.  Pt states she feels fine.

## 2015-09-25 NOTE — H&P (Signed)
Chief Complaint: Patient was seen in consultation today for right IJ tunneled dialysis catheter exchange vs new placement at the request of [place]  Referring Physician(s): Fran Lowes  Supervising Physician: Sandi Mariscal  Patient Status: Outpatient  History of Present Illness: Courtney [name] is a 75 y.o. female   Pt living at [facility] after CVA Using Rt IJ dialysis for weeks New placement of Rt arm dialysis graft 09/20/15 with Dr Rosana Hoes in [place] Last use of catheter was Sat 7/29 - without issues Attempt 8/1 -- malfunction- unable to dialyze Now request for tunneled dialysis catheter exchange vs replacement  Dtr shows concerns of swelling in patients Rt arm after graft placement Noted swelling and numbness just since Sun pm Recommend seeing Dr Rosana Hoes for evaluation   Past Medical History:  Diagnosis Date  . Diabetes mellitus without complication ([place])   . Hypertension   . Stroke [facility])    2-3 yrs ago- right sided weakness    Past Surgical History:  Procedure Laterality Date  . APPENDECTOMY    . CATARACT EXTRACTION W/PHACO Left 06/29/2013   Procedure: CATARACT EXTRACTION PHACO AND INTRAOCULAR LENS PLACEMENT (IOC);  Surgeon: Tonny Branch, MD;  Location: [facility];  Service: Ophthalmology;  Laterality: Left;  CDE 12.25  . CATARACT EXTRACTION W/PHACO Right 07/27/2013   Procedure: CATARACT EXTRACTION PHACO AND INTRAOCULAR LENS PLACEMENT (IOC);  Surgeon: Tonny Branch, MD;  Location: [facility];  Service: Ophthalmology;  Laterality: Right;  CDE:7.72  . INSERTION OF DIALYSIS CATHETER Right 09/11/2015   Procedure: INSERTION OF TUNNELED DIALYSIS CATHETER;  Surgeon: Vickie Epley, MD;  Location: [facility];  Service: Vascular;  Laterality: Right;  . INTRAMEDULLARY (IM) NAIL INTERTROCHANTERIC Right 07/20/2012   Procedure: INTRAMEDULLARY (IM) NAIL INTERTROCHANTRIC;  Surgeon: Carole Civil, MD;  Location: [facility];  Service: Orthopedics;  Laterality: Right;     Allergies: Grapefruit flavor [flavoring agent]  Medications: Prior to Admission medications   Medication Sig Start Date End Date Taking? Authorizing Provider  aspirin EC 81 MG tablet Take 81 mg by mouth daily.   Yes Historical Provider, MD  atorvastatin (LIPITOR) 20 MG tablet Take 20 mg by mouth daily.   Yes Historical Provider, MD  carvedilol (COREG) 6.25 MG tablet Take 6.25 mg by mouth 2 (two) times daily with a meal.   Yes Historical Provider, MD  hydrALAZINE (APRESOLINE) 25 MG tablet Take 25 mg by mouth 3 (three) times daily. 07/22/12  Yes Courtney Dike, MD  isosorbide mononitrate (IMDUR) 30 MG 24 hr tablet Take 30 mg by mouth daily.   Yes Historical Provider, MD  loperamide (IMODIUM A-D) 2 MG tablet Take 2 mg by mouth every 6 (six) hours as needed for diarrhea or loose stools.   Yes Historical Provider, MD  Multiple Vitamin (MULTIVITAMIN WITH MINERALS) TABS tablet Take 1 tablet by mouth daily.   Yes Historical Provider, MD  oxyCODONE-acetaminophen (ROXICET) 5-325 MG tablet Take 1 tablet by mouth every 6 (six) hours as needed for severe pain. 09/20/15  Yes Vickie Epley, MD  sodium bicarbonate 650 MG tablet Take 650 mg by mouth 3 (three) times daily.   Yes Historical Provider, MD     Family History  Problem Relation Age of Onset  . Heart disease Mother   . Heart disease Father     Social History   Social History  . Marital status: Single    Spouse name: N/A  . Number of children: N/A  . Years of education: N/A   Social History  Main Topics  . Smoking status: Never Smoker  . Smokeless tobacco: Never Used  . Alcohol use No  . Drug use: No  . Sexual activity: No   Other Topics Concern  . None   Social History Narrative  . None     Review of Systems: A 12 point ROS discussed and pertinent positives are indicated in the HPI above.  All other systems are negative.  Review of Systems  Constitutional: Negative for activity change, appetite change, fatigue and  fever.  Respiratory: Negative for shortness of breath.   Neurological: Positive for weakness.  Psychiatric/Behavioral: Negative for behavioral problems and confusion.    Vital Signs: BP (!) 142/42   Pulse 60   Temp 98.3 F (36.8 C) (Oral)   Resp 18   Ht 4\' 11"  (1.499 m)   Wt 80 lb (36.3 kg)   SpO2 100%   BMI 16.16 kg/m   Physical Exam  Constitutional: She is oriented to person, place, and time.  Thin, frail  Cardiovascular: Normal rate, regular rhythm and normal heart sounds.   Pulmonary/Chest: Effort normal and breath sounds normal. She has no wheezes.  Abdominal: Soft. Bowel sounds are normal. There is no tenderness.  Musculoskeletal: Normal range of motion. She exhibits edema and tenderness.  Neurological: She is alert and oriented to person, place, and time.  Skin: Skin is warm and dry.  ecchymotic at surgical sites at Rt arm dialysis graft  swelling of Rt arm noted Tender to palpate most of arm + thrill in new graft palpated  Nursing note and vitals reviewed.   Mallampati Score:  MD Evaluation Airway: WNL Heart: WNL Abdomen: WNL Chest/ Lungs: WNL ASA  Classification: 3 Mallampati/Airway Score: One  Imaging: Chest 2 View  Result Date: 09/10/2015 CLINICAL DATA:  Preoperative exam for dialysis catheter insertion. Hypertension, renal disease EXAM: CHEST  2 VIEW COMPARISON:  07/04/2015 FINDINGS: Stable cardiomegaly without CHF or focal pneumonia. Nipple shadows noted bilaterally over the chest. No edema, effusion or pneumothorax. Trachea midline. Minor scoliosis of the thoracic spine. IMPRESSION: Stable chest exam.  No superimposed acute process. Electronically Signed   By: Jerilynn Mages.  Shick M.D.   On: 09/10/2015 19:48   [place] Intraoperative  Result Date: 09/11/2015 CLINICAL DATA:  Ultrasound was provided for use by the ordering physician, and a technical charge was applied by the performing facility.  No radiologist interpretation/professional services rendered.   [place] Ue  Vein Mapping  Result Date: 09/11/2015 CLINICAL DATA:  75 year old female with end-stage renal disease in need of hemodialysis. This is a bilateral upper extremity venous mapping study prior to arteriovenous fistula creation. EXAM: BILATERAL UPPER EXTREMITY VENOUS AND LIMITED ARTERIAL DOPPLER ULTRASOUND TECHNIQUE: Gray-scale sonography with color Doppler and duplex ultrasound were performed to evaluate the bilateral upper extremity venous systems. COMPARISON:  None. FINDINGS: RIGHT UPPER EXTREMITY Internal Jugular Vein: No evidence of thrombus. Normal compressibility, respiratory phasicity and response to augmentation. There is currently a right-sided tunneled hemodialysis catheter via the right internal jugular vein. Subclavian Vein: No evidence of thrombus. Normal compressibility, respiratory phasicity and response to augmentation. Axillary Vein: No evidence of thrombus. Normal compressibility, respiratory phasicity and response to augmentation. Cephalic Vein: No evidence of thrombus. Normal compressibility, respiratory phasicity and response to augmentation. The vessel measures 0.9- 1.9 mm in diameter. Basilic Vein: No evidence of thrombus. Normal compressibility, respiratory phasicity and response to augmentation. The vessel measures 1.7-2.4 mm in diameter. Brachial Veins: No evidence of thrombus. Normal compressibility, respiratory phasicity and response to augmentation. The brachial  artery measures 4.4 mm in demonstrates normal triphasic waveforms. Radial Veins: No evidence of thrombus. Normal compressibility, respiratory phasicity and response to augmentation. The radial artery measures 3.2 mm in demonstrates triphasic flow. Ulnar Veins: No evidence of thrombus. Normal compressibility, respiratory phasicity and response to augmentation. Venous Reflux:  None. Other Findings:  None. LEFT UPPER EXTREMITY Internal Jugular Vein: No evidence of thrombus. Normal compressibility, respiratory phasicity and response  to augmentation. Subclavian Vein: No evidence of thrombus. Normal compressibility, respiratory phasicity and response to augmentation. Axillary Vein: No evidence of thrombus. Normal compressibility, respiratory phasicity and response to augmentation. Cephalic Vein: No evidence of thrombus. Normal compressibility, respiratory phasicity and response to augmentation. The vein measures 0.6-0.7 mm. Basilic Vein: No evidence of thrombus. Normal compressibility, respiratory phasicity and response to augmentation. Vein measures 0.9-2.0 mm. Brachial Veins: No evidence of thrombus. Normal compressibility, respiratory phasicity and response to augmentation. The veins measure 2-4 mm. The brachial artery is 4.1 mm in diameter and demonstrates triphasic flow. Radial Veins: No evidence of thrombus. Normal compressibility, respiratory phasicity and response to augmentation. The radial artery measures 2.8 mm in diameter and demonstrates triphasic flow. Ulnar Veins: No evidence of thrombus. Normal compressibility, respiratory phasicity and response to augmentation. Venous Reflux:  None. Other Findings:  None. IMPRESSION: 1. No evidence of DVT in the bilateral upper extremities. 2. Upper extremity venous and arterial measurements as above. 3. Right IJ tunneled hemodialysis catheter. Signed, Criselda Peaches, MD [facility] Electronically Signed   By: Jacqulynn Cadet M.D.   On: 09/11/2015 16:01   [place] U[employer]  Result Date: 09/11/2015 CLINICAL DATA:  75 year old female with a history of end-stage renal disease. She has been referred for upper extremity venous mapping for creation of arterial venous fistula. EXAM: BILATERAL UPPER EXTREMITY VENOUS DOPPLER ULTRASOUND TECHNIQUE: Gray-scale sonography with graded compression, as well as color Doppler and duplex ultrasound were performed to evaluate the bilateral upper extremity deep venous systems from the level of  the subclavian vein and including the jugular, axillary, basilic, radial, ulnar and upper cephalic vein. Spectral Doppler was utilized to evaluate flow at rest and with distal augmentation maneuvers. The ultrasound technologist has included venous map of bilateral upper extremity venous anatomy. COMPARISON:  None. FINDINGS: RIGHT UPPER EXTREMITY Internal Jugular Vein: Dialysis catheter within the right internal jugular. Subclavian Vein: No evidence of thrombus. Normal compressibility, respiratory phasicity and response to augmentation. Axillary Vein: No evidence of thrombus. Normal compressibility, respiratory phasicity and response to augmentation. Cephalic Vein: No evidence of thrombus. Normal compressibility, respiratory phasicity and response to augmentation. Basilic Vein: No evidence of thrombus. Normal compressibility, respiratory phasicity and response to augmentation. Brachial Veins: No evidence of thrombus. Normal compressibility, respiratory phasicity and response to augmentation. Radial Veins: No evidence of thrombus. Normal compressibility, respiratory phasicity and response to augmentation. Other Findings:  None. LEFT UPPER EXTREMITY Subclavian Vein: No evidence of thrombus. Normal compressibility, respiratory phasicity and response to augmentation. Axillary Vein: No evidence of thrombus. Normal compressibility, respiratory phasicity and response to augmentation. Cephalic Vein: No evidence of thrombus. Normal compressibility, respiratory phasicity and response to augmentation. Basilic Vein: No evidence of thrombus. Normal compressibility, respiratory phasicity and response to augmentation. Brachial Veins: No evidence of thrombus. Normal compressibility, respiratory phasicity and response to augmentation. Radial Veins: No evidence of thrombus. Normal compressibility, respiratory phasicity and response to augmentation. Other Findings:  None. IMPRESSION: Sonographic survey and mapping of the bilateral  upper extremities demonstrates no evidence of DVT. Signed,  Dulcy Fanny. Earleen Newport, DO [facility] Electronically Signed   By: Corrie Mckusick D.O.   On: 09/11/2015 15:49   Dg Chest Port 1 View  Result Date: 09/11/2015 CLINICAL DATA:  75 year old female status post tunneled dialysis catheter placement. Initial encounter. EXAM: PORTABLE CHEST 1 VIEW COMPARISON:  09/10/2015. FINDINGS: Portable AP upright view at 1156 hours. Right chest IJ approach dual lumen dialysis type catheter now in place. The entire catheter is not included on this image (the portion above the clavicle at the venous puncture site). The catheter tip projects at the lower SVC level just below the carina. No pneumothorax. Mildly lower lung volumes. Stable cardiac size and mediastinal contours. No acute pulmonary opacity. IMPRESSION: No pneumothorax or acute pulmonary findings following right IJ approach dialysis catheter placement. The superior most course of the catheter is not included, but otherwise no adverse features are identified. Electronically Signed   By: Genevie Ann M.D.   On: 09/11/2015 12:24   Dg C-arm 1-60 Min-no Report  Result Date: 09/11/2015 CLINICAL DATA: hemodialysis catheter C-ARM 1-60 MINUTES Fluoroscopy was utilized by the requesting physician.  No radiographic interpretation.    Labs:  CBC:  Recent Labs  09/20/15 0902  WBC 5.9  HGB 8.4*  HCT 26.3*  PLT 164    COAGS: No results for input(s): INR, APTT in the last 8760 hours.  BMP:  Recent Labs  09/20/15 0902  NA 139  K 3.7  CL 107  CO2 28  GLUCOSE 88  BUN 12  CALCIUM 8.3*  CREATININE 2.41*  GFRNONAA 19*  GFRAA 22*    LIVER FUNCTION TESTS: No results for input(s): BILITOT, AST, ALT, ALKPHOS, PROT, ALBUMIN in the last 8760 hours.  TUMOR MARKERS: No results for input(s): AFPTM, CEA, CA199, CHROMGRNA in the last 8760 hours.  Assessment and Plan:  Malfunction Rt IJ dialysis  catheter Noted 09/24/15 Now scheduled for catheter exchange vs replacement Risks and Benefits discussed with the patient including, but not limited to bleeding, infection, vascular injury, pneumothorax which may require chest tube placement, air embolism or even death All of the patient's questions were answered, patient is agreeable to proceed. Consent signed and in chart.   Thank you for this interesting consult.  I greatly enjoyed meeting Courtney [name] and look forward to participating in their care.  A copy of this report was sent to the requesting provider on this date.  Electronically Signed: Calyb Mcquarrie A 09/25/2015, 11:01 AM   I spent a total of   30 minutes  in face to face in clinical consultation, greater than 50% of which was counseling/coordinating care for Rt IJ tunneled dialysis catheter exch vs replacement

## 2015-09-25 NOTE — Sedation Documentation (Signed)
Patient is resting comfortably. No complaints at this time, vital signs stable.

## 2015-09-26 ENCOUNTER — Encounter (HOSPITAL_COMMUNITY): Payer: Self-pay | Admitting: Surgery

## 2015-09-26 DIAGNOSIS — I5032 Chronic diastolic (congestive) heart failure: Secondary | ICD-10-CM | POA: Diagnosis not present

## 2015-09-26 DIAGNOSIS — I132 Hypertensive heart and chronic kidney disease with heart failure and with stage 5 chronic kidney disease, or end stage renal disease: Secondary | ICD-10-CM | POA: Diagnosis not present

## 2015-09-26 DIAGNOSIS — I10 Essential (primary) hypertension: Secondary | ICD-10-CM | POA: Diagnosis not present

## 2015-09-26 DIAGNOSIS — E1129 Type 2 diabetes mellitus with other diabetic kidney complication: Secondary | ICD-10-CM | POA: Diagnosis not present

## 2015-09-26 DIAGNOSIS — D631 Anemia in chronic kidney disease: Secondary | ICD-10-CM | POA: Diagnosis not present

## 2015-09-26 DIAGNOSIS — E1122 Type 2 diabetes mellitus with diabetic chronic kidney disease: Secondary | ICD-10-CM | POA: Diagnosis not present

## 2015-09-26 DIAGNOSIS — N186 End stage renal disease: Secondary | ICD-10-CM | POA: Diagnosis not present

## 2015-09-26 DIAGNOSIS — Z992 Dependence on renal dialysis: Secondary | ICD-10-CM | POA: Diagnosis not present

## 2015-09-26 NOTE — Addendum Note (Signed)
Addendum  created 09/26/15 0810 by Vista Deck, CRNA   Anesthesia Event edited, Anesthesia Intra Meds edited

## 2015-09-27 DIAGNOSIS — Z992 Dependence on renal dialysis: Secondary | ICD-10-CM | POA: Diagnosis not present

## 2015-09-27 DIAGNOSIS — I5032 Chronic diastolic (congestive) heart failure: Secondary | ICD-10-CM | POA: Diagnosis not present

## 2015-09-27 DIAGNOSIS — Z86718 Personal history of other venous thrombosis and embolism: Secondary | ICD-10-CM | POA: Diagnosis not present

## 2015-09-27 DIAGNOSIS — N2581 Secondary hyperparathyroidism of renal origin: Secondary | ICD-10-CM | POA: Diagnosis not present

## 2015-09-27 DIAGNOSIS — E876 Hypokalemia: Secondary | ICD-10-CM | POA: Diagnosis not present

## 2015-09-27 DIAGNOSIS — R2689 Other abnormalities of gait and mobility: Secondary | ICD-10-CM | POA: Diagnosis not present

## 2015-09-27 DIAGNOSIS — I10 Essential (primary) hypertension: Secondary | ICD-10-CM | POA: Diagnosis not present

## 2015-09-27 DIAGNOSIS — D649 Anemia, unspecified: Secondary | ICD-10-CM | POA: Diagnosis not present

## 2015-09-27 DIAGNOSIS — Z780 Asymptomatic menopausal state: Secondary | ICD-10-CM | POA: Diagnosis not present

## 2015-09-27 DIAGNOSIS — I130 Hypertensive heart and chronic kidney disease with heart failure and stage 1 through stage 4 chronic kidney disease, or unspecified chronic kidney disease: Secondary | ICD-10-CM | POA: Diagnosis not present

## 2015-09-27 DIAGNOSIS — E1129 Type 2 diabetes mellitus with other diabetic kidney complication: Secondary | ICD-10-CM | POA: Diagnosis not present

## 2015-09-27 DIAGNOSIS — N390 Urinary tract infection, site not specified: Secondary | ICD-10-CM | POA: Diagnosis not present

## 2015-09-27 DIAGNOSIS — D631 Anemia in chronic kidney disease: Secondary | ICD-10-CM | POA: Diagnosis not present

## 2015-09-27 DIAGNOSIS — R1312 Dysphagia, oropharyngeal phase: Secondary | ICD-10-CM | POA: Diagnosis not present

## 2015-09-27 DIAGNOSIS — Z23 Encounter for immunization: Secondary | ICD-10-CM | POA: Diagnosis not present

## 2015-09-27 DIAGNOSIS — I132 Hypertensive heart and chronic kidney disease with heart failure and with stage 5 chronic kidney disease, or end stage renal disease: Secondary | ICD-10-CM | POA: Diagnosis not present

## 2015-09-27 DIAGNOSIS — N186 End stage renal disease: Secondary | ICD-10-CM | POA: Diagnosis not present

## 2015-09-27 DIAGNOSIS — D509 Iron deficiency anemia, unspecified: Secondary | ICD-10-CM | POA: Diagnosis not present

## 2015-09-27 DIAGNOSIS — E440 Moderate protein-calorie malnutrition: Secondary | ICD-10-CM | POA: Diagnosis not present

## 2015-09-27 DIAGNOSIS — B952 Enterococcus as the cause of diseases classified elsewhere: Secondary | ICD-10-CM | POA: Diagnosis not present

## 2015-09-27 DIAGNOSIS — E1122 Type 2 diabetes mellitus with diabetic chronic kidney disease: Secondary | ICD-10-CM | POA: Diagnosis not present

## 2015-09-27 DIAGNOSIS — I5022 Chronic systolic (congestive) heart failure: Secondary | ICD-10-CM | POA: Diagnosis not present

## 2015-09-27 DIAGNOSIS — I69351 Hemiplegia and hemiparesis following cerebral infarction affecting right dominant side: Secondary | ICD-10-CM | POA: Diagnosis not present

## 2015-09-27 DIAGNOSIS — N189 Chronic kidney disease, unspecified: Secondary | ICD-10-CM | POA: Diagnosis not present

## 2015-09-27 DIAGNOSIS — R41841 Cognitive communication deficit: Secondary | ICD-10-CM | POA: Diagnosis not present

## 2015-09-27 DIAGNOSIS — I69354 Hemiplegia and hemiparesis following cerebral infarction affecting left non-dominant side: Secondary | ICD-10-CM | POA: Diagnosis not present

## 2015-09-27 DIAGNOSIS — R278 Other lack of coordination: Secondary | ICD-10-CM | POA: Diagnosis not present

## 2015-09-27 DIAGNOSIS — E785 Hyperlipidemia, unspecified: Secondary | ICD-10-CM | POA: Diagnosis not present

## 2015-09-28 DIAGNOSIS — N2581 Secondary hyperparathyroidism of renal origin: Secondary | ICD-10-CM | POA: Diagnosis not present

## 2015-09-28 DIAGNOSIS — Z992 Dependence on renal dialysis: Secondary | ICD-10-CM | POA: Diagnosis not present

## 2015-09-28 DIAGNOSIS — D509 Iron deficiency anemia, unspecified: Secondary | ICD-10-CM | POA: Diagnosis not present

## 2015-09-28 DIAGNOSIS — Z23 Encounter for immunization: Secondary | ICD-10-CM | POA: Diagnosis not present

## 2015-09-28 DIAGNOSIS — N186 End stage renal disease: Secondary | ICD-10-CM | POA: Diagnosis not present

## 2015-09-28 DIAGNOSIS — D631 Anemia in chronic kidney disease: Secondary | ICD-10-CM | POA: Diagnosis not present

## 2015-09-30 NOTE — Addendum Note (Signed)
Addendum  created 09/30/15 1129 by Charmaine Downs, CRNA   Anesthesia Event edited

## 2015-10-01 DIAGNOSIS — N186 End stage renal disease: Secondary | ICD-10-CM | POA: Diagnosis not present

## 2015-10-01 DIAGNOSIS — N2581 Secondary hyperparathyroidism of renal origin: Secondary | ICD-10-CM | POA: Diagnosis not present

## 2015-10-01 DIAGNOSIS — Z992 Dependence on renal dialysis: Secondary | ICD-10-CM | POA: Diagnosis not present

## 2015-10-01 DIAGNOSIS — Z23 Encounter for immunization: Secondary | ICD-10-CM | POA: Diagnosis not present

## 2015-10-01 DIAGNOSIS — D509 Iron deficiency anemia, unspecified: Secondary | ICD-10-CM | POA: Diagnosis not present

## 2015-10-01 DIAGNOSIS — D631 Anemia in chronic kidney disease: Secondary | ICD-10-CM | POA: Diagnosis not present

## 2015-10-03 DIAGNOSIS — Z992 Dependence on renal dialysis: Secondary | ICD-10-CM | POA: Diagnosis not present

## 2015-10-03 DIAGNOSIS — N2581 Secondary hyperparathyroidism of renal origin: Secondary | ICD-10-CM | POA: Diagnosis not present

## 2015-10-03 DIAGNOSIS — Z23 Encounter for immunization: Secondary | ICD-10-CM | POA: Diagnosis not present

## 2015-10-03 DIAGNOSIS — N186 End stage renal disease: Secondary | ICD-10-CM | POA: Diagnosis not present

## 2015-10-03 DIAGNOSIS — D631 Anemia in chronic kidney disease: Secondary | ICD-10-CM | POA: Diagnosis not present

## 2015-10-03 DIAGNOSIS — D509 Iron deficiency anemia, unspecified: Secondary | ICD-10-CM | POA: Diagnosis not present

## 2015-10-05 DIAGNOSIS — D631 Anemia in chronic kidney disease: Secondary | ICD-10-CM | POA: Diagnosis not present

## 2015-10-05 DIAGNOSIS — D509 Iron deficiency anemia, unspecified: Secondary | ICD-10-CM | POA: Diagnosis not present

## 2015-10-05 DIAGNOSIS — Z992 Dependence on renal dialysis: Secondary | ICD-10-CM | POA: Diagnosis not present

## 2015-10-05 DIAGNOSIS — N2581 Secondary hyperparathyroidism of renal origin: Secondary | ICD-10-CM | POA: Diagnosis not present

## 2015-10-05 DIAGNOSIS — N186 End stage renal disease: Secondary | ICD-10-CM | POA: Diagnosis not present

## 2015-10-05 DIAGNOSIS — Z23 Encounter for immunization: Secondary | ICD-10-CM | POA: Diagnosis not present

## 2015-10-08 DIAGNOSIS — N186 End stage renal disease: Secondary | ICD-10-CM | POA: Diagnosis not present

## 2015-10-08 DIAGNOSIS — D509 Iron deficiency anemia, unspecified: Secondary | ICD-10-CM | POA: Diagnosis not present

## 2015-10-08 DIAGNOSIS — Z992 Dependence on renal dialysis: Secondary | ICD-10-CM | POA: Diagnosis not present

## 2015-10-08 DIAGNOSIS — N2581 Secondary hyperparathyroidism of renal origin: Secondary | ICD-10-CM | POA: Diagnosis not present

## 2015-10-08 DIAGNOSIS — D631 Anemia in chronic kidney disease: Secondary | ICD-10-CM | POA: Diagnosis not present

## 2015-10-08 DIAGNOSIS — Z23 Encounter for immunization: Secondary | ICD-10-CM | POA: Diagnosis not present

## 2015-10-10 DIAGNOSIS — Z992 Dependence on renal dialysis: Secondary | ICD-10-CM | POA: Diagnosis not present

## 2015-10-10 DIAGNOSIS — N186 End stage renal disease: Secondary | ICD-10-CM | POA: Diagnosis not present

## 2015-10-10 DIAGNOSIS — D509 Iron deficiency anemia, unspecified: Secondary | ICD-10-CM | POA: Diagnosis not present

## 2015-10-10 DIAGNOSIS — N2581 Secondary hyperparathyroidism of renal origin: Secondary | ICD-10-CM | POA: Diagnosis not present

## 2015-10-10 DIAGNOSIS — D631 Anemia in chronic kidney disease: Secondary | ICD-10-CM | POA: Diagnosis not present

## 2015-10-10 DIAGNOSIS — Z23 Encounter for immunization: Secondary | ICD-10-CM | POA: Diagnosis not present

## 2015-10-11 DIAGNOSIS — R1312 Dysphagia, oropharyngeal phase: Secondary | ICD-10-CM | POA: Diagnosis not present

## 2015-10-11 DIAGNOSIS — Z992 Dependence on renal dialysis: Secondary | ICD-10-CM | POA: Diagnosis not present

## 2015-10-11 DIAGNOSIS — E1122 Type 2 diabetes mellitus with diabetic chronic kidney disease: Secondary | ICD-10-CM | POA: Diagnosis not present

## 2015-10-11 DIAGNOSIS — Z7982 Long term (current) use of aspirin: Secondary | ICD-10-CM | POA: Diagnosis not present

## 2015-10-11 DIAGNOSIS — Z452 Encounter for adjustment and management of vascular access device: Secondary | ICD-10-CM | POA: Diagnosis not present

## 2015-10-11 DIAGNOSIS — E876 Hypokalemia: Secondary | ICD-10-CM | POA: Diagnosis not present

## 2015-10-11 DIAGNOSIS — E46 Unspecified protein-calorie malnutrition: Secondary | ICD-10-CM | POA: Diagnosis not present

## 2015-10-11 DIAGNOSIS — F329 Major depressive disorder, single episode, unspecified: Secondary | ICD-10-CM | POA: Diagnosis not present

## 2015-10-11 DIAGNOSIS — E785 Hyperlipidemia, unspecified: Secondary | ICD-10-CM | POA: Diagnosis not present

## 2015-10-11 DIAGNOSIS — I69351 Hemiplegia and hemiparesis following cerebral infarction affecting right dominant side: Secondary | ICD-10-CM | POA: Diagnosis not present

## 2015-10-11 DIAGNOSIS — Z780 Asymptomatic menopausal state: Secondary | ICD-10-CM | POA: Diagnosis not present

## 2015-10-11 DIAGNOSIS — N186 End stage renal disease: Secondary | ICD-10-CM | POA: Diagnosis not present

## 2015-10-11 DIAGNOSIS — I120 Hypertensive chronic kidney disease with stage 5 chronic kidney disease or end stage renal disease: Secondary | ICD-10-CM | POA: Diagnosis not present

## 2015-10-11 DIAGNOSIS — D631 Anemia in chronic kidney disease: Secondary | ICD-10-CM | POA: Diagnosis not present

## 2015-10-13 DIAGNOSIS — D509 Iron deficiency anemia, unspecified: Secondary | ICD-10-CM | POA: Diagnosis not present

## 2015-10-13 DIAGNOSIS — N2581 Secondary hyperparathyroidism of renal origin: Secondary | ICD-10-CM | POA: Diagnosis not present

## 2015-10-13 DIAGNOSIS — Z23 Encounter for immunization: Secondary | ICD-10-CM | POA: Diagnosis not present

## 2015-10-13 DIAGNOSIS — N186 End stage renal disease: Secondary | ICD-10-CM | POA: Diagnosis not present

## 2015-10-13 DIAGNOSIS — Z992 Dependence on renal dialysis: Secondary | ICD-10-CM | POA: Diagnosis not present

## 2015-10-13 DIAGNOSIS — D631 Anemia in chronic kidney disease: Secondary | ICD-10-CM | POA: Diagnosis not present

## 2015-10-14 DIAGNOSIS — N186 End stage renal disease: Secondary | ICD-10-CM | POA: Diagnosis not present

## 2015-10-14 DIAGNOSIS — I69351 Hemiplegia and hemiparesis following cerebral infarction affecting right dominant side: Secondary | ICD-10-CM | POA: Diagnosis not present

## 2015-10-14 DIAGNOSIS — E46 Unspecified protein-calorie malnutrition: Secondary | ICD-10-CM | POA: Diagnosis not present

## 2015-10-14 DIAGNOSIS — I120 Hypertensive chronic kidney disease with stage 5 chronic kidney disease or end stage renal disease: Secondary | ICD-10-CM | POA: Diagnosis not present

## 2015-10-14 DIAGNOSIS — D631 Anemia in chronic kidney disease: Secondary | ICD-10-CM | POA: Diagnosis not present

## 2015-10-14 DIAGNOSIS — E1122 Type 2 diabetes mellitus with diabetic chronic kidney disease: Secondary | ICD-10-CM | POA: Diagnosis not present

## 2015-10-15 DIAGNOSIS — Z23 Encounter for immunization: Secondary | ICD-10-CM | POA: Diagnosis not present

## 2015-10-15 DIAGNOSIS — I69351 Hemiplegia and hemiparesis following cerebral infarction affecting right dominant side: Secondary | ICD-10-CM | POA: Diagnosis not present

## 2015-10-15 DIAGNOSIS — N2581 Secondary hyperparathyroidism of renal origin: Secondary | ICD-10-CM | POA: Diagnosis not present

## 2015-10-15 DIAGNOSIS — E1122 Type 2 diabetes mellitus with diabetic chronic kidney disease: Secondary | ICD-10-CM | POA: Diagnosis not present

## 2015-10-15 DIAGNOSIS — Z992 Dependence on renal dialysis: Secondary | ICD-10-CM | POA: Diagnosis not present

## 2015-10-15 DIAGNOSIS — N186 End stage renal disease: Secondary | ICD-10-CM | POA: Diagnosis not present

## 2015-10-15 DIAGNOSIS — D631 Anemia in chronic kidney disease: Secondary | ICD-10-CM | POA: Diagnosis not present

## 2015-10-15 DIAGNOSIS — E46 Unspecified protein-calorie malnutrition: Secondary | ICD-10-CM | POA: Diagnosis not present

## 2015-10-15 DIAGNOSIS — D509 Iron deficiency anemia, unspecified: Secondary | ICD-10-CM | POA: Diagnosis not present

## 2015-10-15 DIAGNOSIS — I120 Hypertensive chronic kidney disease with stage 5 chronic kidney disease or end stage renal disease: Secondary | ICD-10-CM | POA: Diagnosis not present

## 2015-10-16 DIAGNOSIS — D631 Anemia in chronic kidney disease: Secondary | ICD-10-CM | POA: Diagnosis not present

## 2015-10-16 DIAGNOSIS — I120 Hypertensive chronic kidney disease with stage 5 chronic kidney disease or end stage renal disease: Secondary | ICD-10-CM | POA: Diagnosis not present

## 2015-10-16 DIAGNOSIS — E46 Unspecified protein-calorie malnutrition: Secondary | ICD-10-CM | POA: Diagnosis not present

## 2015-10-16 DIAGNOSIS — I69351 Hemiplegia and hemiparesis following cerebral infarction affecting right dominant side: Secondary | ICD-10-CM | POA: Diagnosis not present

## 2015-10-16 DIAGNOSIS — E1122 Type 2 diabetes mellitus with diabetic chronic kidney disease: Secondary | ICD-10-CM | POA: Diagnosis not present

## 2015-10-16 DIAGNOSIS — N186 End stage renal disease: Secondary | ICD-10-CM | POA: Diagnosis not present

## 2015-10-17 DIAGNOSIS — D631 Anemia in chronic kidney disease: Secondary | ICD-10-CM | POA: Diagnosis not present

## 2015-10-17 DIAGNOSIS — D509 Iron deficiency anemia, unspecified: Secondary | ICD-10-CM | POA: Diagnosis not present

## 2015-10-17 DIAGNOSIS — N2581 Secondary hyperparathyroidism of renal origin: Secondary | ICD-10-CM | POA: Diagnosis not present

## 2015-10-17 DIAGNOSIS — N186 End stage renal disease: Secondary | ICD-10-CM | POA: Diagnosis not present

## 2015-10-17 DIAGNOSIS — Z992 Dependence on renal dialysis: Secondary | ICD-10-CM | POA: Diagnosis not present

## 2015-10-17 DIAGNOSIS — Z23 Encounter for immunization: Secondary | ICD-10-CM | POA: Diagnosis not present

## 2015-10-18 DIAGNOSIS — Z1389 Encounter for screening for other disorder: Secondary | ICD-10-CM | POA: Diagnosis not present

## 2015-10-18 DIAGNOSIS — E1122 Type 2 diabetes mellitus with diabetic chronic kidney disease: Secondary | ICD-10-CM | POA: Diagnosis not present

## 2015-10-18 DIAGNOSIS — E1121 Type 2 diabetes mellitus with diabetic nephropathy: Secondary | ICD-10-CM | POA: Diagnosis not present

## 2015-10-18 DIAGNOSIS — D631 Anemia in chronic kidney disease: Secondary | ICD-10-CM | POA: Diagnosis not present

## 2015-10-18 DIAGNOSIS — E46 Unspecified protein-calorie malnutrition: Secondary | ICD-10-CM | POA: Diagnosis not present

## 2015-10-18 DIAGNOSIS — I69351 Hemiplegia and hemiparesis following cerebral infarction affecting right dominant side: Secondary | ICD-10-CM | POA: Diagnosis not present

## 2015-10-18 DIAGNOSIS — F3289 Other specified depressive episodes: Secondary | ICD-10-CM | POA: Diagnosis not present

## 2015-10-18 DIAGNOSIS — I120 Hypertensive chronic kidney disease with stage 5 chronic kidney disease or end stage renal disease: Secondary | ICD-10-CM | POA: Diagnosis not present

## 2015-10-18 DIAGNOSIS — N186 End stage renal disease: Secondary | ICD-10-CM | POA: Diagnosis not present

## 2015-10-18 DIAGNOSIS — Z0000 Encounter for general adult medical examination without abnormal findings: Secondary | ICD-10-CM | POA: Diagnosis not present

## 2015-10-19 DIAGNOSIS — D509 Iron deficiency anemia, unspecified: Secondary | ICD-10-CM | POA: Diagnosis not present

## 2015-10-19 DIAGNOSIS — Z992 Dependence on renal dialysis: Secondary | ICD-10-CM | POA: Diagnosis not present

## 2015-10-19 DIAGNOSIS — Z23 Encounter for immunization: Secondary | ICD-10-CM | POA: Diagnosis not present

## 2015-10-19 DIAGNOSIS — N186 End stage renal disease: Secondary | ICD-10-CM | POA: Diagnosis not present

## 2015-10-19 DIAGNOSIS — D631 Anemia in chronic kidney disease: Secondary | ICD-10-CM | POA: Diagnosis not present

## 2015-10-19 DIAGNOSIS — N2581 Secondary hyperparathyroidism of renal origin: Secondary | ICD-10-CM | POA: Diagnosis not present

## 2015-10-21 DIAGNOSIS — E46 Unspecified protein-calorie malnutrition: Secondary | ICD-10-CM | POA: Diagnosis not present

## 2015-10-21 DIAGNOSIS — D631 Anemia in chronic kidney disease: Secondary | ICD-10-CM | POA: Diagnosis not present

## 2015-10-21 DIAGNOSIS — I120 Hypertensive chronic kidney disease with stage 5 chronic kidney disease or end stage renal disease: Secondary | ICD-10-CM | POA: Diagnosis not present

## 2015-10-21 DIAGNOSIS — I69351 Hemiplegia and hemiparesis following cerebral infarction affecting right dominant side: Secondary | ICD-10-CM | POA: Diagnosis not present

## 2015-10-21 DIAGNOSIS — E1122 Type 2 diabetes mellitus with diabetic chronic kidney disease: Secondary | ICD-10-CM | POA: Diagnosis not present

## 2015-10-21 DIAGNOSIS — N186 End stage renal disease: Secondary | ICD-10-CM | POA: Diagnosis not present

## 2015-10-22 DIAGNOSIS — N2581 Secondary hyperparathyroidism of renal origin: Secondary | ICD-10-CM | POA: Diagnosis not present

## 2015-10-22 DIAGNOSIS — D509 Iron deficiency anemia, unspecified: Secondary | ICD-10-CM | POA: Diagnosis not present

## 2015-10-22 DIAGNOSIS — N186 End stage renal disease: Secondary | ICD-10-CM | POA: Diagnosis not present

## 2015-10-22 DIAGNOSIS — Z992 Dependence on renal dialysis: Secondary | ICD-10-CM | POA: Diagnosis not present

## 2015-10-22 DIAGNOSIS — Z23 Encounter for immunization: Secondary | ICD-10-CM | POA: Diagnosis not present

## 2015-10-22 DIAGNOSIS — D631 Anemia in chronic kidney disease: Secondary | ICD-10-CM | POA: Diagnosis not present

## 2015-10-23 DIAGNOSIS — D631 Anemia in chronic kidney disease: Secondary | ICD-10-CM | POA: Diagnosis not present

## 2015-10-23 DIAGNOSIS — I69351 Hemiplegia and hemiparesis following cerebral infarction affecting right dominant side: Secondary | ICD-10-CM | POA: Diagnosis not present

## 2015-10-23 DIAGNOSIS — N186 End stage renal disease: Secondary | ICD-10-CM | POA: Diagnosis not present

## 2015-10-23 DIAGNOSIS — E1122 Type 2 diabetes mellitus with diabetic chronic kidney disease: Secondary | ICD-10-CM | POA: Diagnosis not present

## 2015-10-23 DIAGNOSIS — E46 Unspecified protein-calorie malnutrition: Secondary | ICD-10-CM | POA: Diagnosis not present

## 2015-10-23 DIAGNOSIS — I120 Hypertensive chronic kidney disease with stage 5 chronic kidney disease or end stage renal disease: Secondary | ICD-10-CM | POA: Diagnosis not present

## 2015-10-24 DIAGNOSIS — D509 Iron deficiency anemia, unspecified: Secondary | ICD-10-CM | POA: Diagnosis not present

## 2015-10-24 DIAGNOSIS — N186 End stage renal disease: Secondary | ICD-10-CM | POA: Diagnosis not present

## 2015-10-24 DIAGNOSIS — N2581 Secondary hyperparathyroidism of renal origin: Secondary | ICD-10-CM | POA: Diagnosis not present

## 2015-10-24 DIAGNOSIS — Z992 Dependence on renal dialysis: Secondary | ICD-10-CM | POA: Diagnosis not present

## 2015-10-24 DIAGNOSIS — D631 Anemia in chronic kidney disease: Secondary | ICD-10-CM | POA: Diagnosis not present

## 2015-10-24 DIAGNOSIS — Z23 Encounter for immunization: Secondary | ICD-10-CM | POA: Diagnosis not present

## 2015-10-25 DIAGNOSIS — E1122 Type 2 diabetes mellitus with diabetic chronic kidney disease: Secondary | ICD-10-CM | POA: Diagnosis not present

## 2015-10-25 DIAGNOSIS — I120 Hypertensive chronic kidney disease with stage 5 chronic kidney disease or end stage renal disease: Secondary | ICD-10-CM | POA: Diagnosis not present

## 2015-10-25 DIAGNOSIS — N186 End stage renal disease: Secondary | ICD-10-CM | POA: Diagnosis not present

## 2015-10-25 DIAGNOSIS — E46 Unspecified protein-calorie malnutrition: Secondary | ICD-10-CM | POA: Diagnosis not present

## 2015-10-25 DIAGNOSIS — I69351 Hemiplegia and hemiparesis following cerebral infarction affecting right dominant side: Secondary | ICD-10-CM | POA: Diagnosis not present

## 2015-10-25 DIAGNOSIS — D631 Anemia in chronic kidney disease: Secondary | ICD-10-CM | POA: Diagnosis not present

## 2015-10-26 DIAGNOSIS — N2581 Secondary hyperparathyroidism of renal origin: Secondary | ICD-10-CM | POA: Diagnosis not present

## 2015-10-26 DIAGNOSIS — D631 Anemia in chronic kidney disease: Secondary | ICD-10-CM | POA: Diagnosis not present

## 2015-10-26 DIAGNOSIS — Z23 Encounter for immunization: Secondary | ICD-10-CM | POA: Diagnosis not present

## 2015-10-26 DIAGNOSIS — D509 Iron deficiency anemia, unspecified: Secondary | ICD-10-CM | POA: Diagnosis not present

## 2015-10-26 DIAGNOSIS — N186 End stage renal disease: Secondary | ICD-10-CM | POA: Diagnosis not present

## 2015-10-26 DIAGNOSIS — Z992 Dependence on renal dialysis: Secondary | ICD-10-CM | POA: Diagnosis not present

## 2015-10-28 DIAGNOSIS — N186 End stage renal disease: Secondary | ICD-10-CM | POA: Diagnosis not present

## 2015-10-28 DIAGNOSIS — I69351 Hemiplegia and hemiparesis following cerebral infarction affecting right dominant side: Secondary | ICD-10-CM | POA: Diagnosis not present

## 2015-10-28 DIAGNOSIS — D631 Anemia in chronic kidney disease: Secondary | ICD-10-CM | POA: Diagnosis not present

## 2015-10-28 DIAGNOSIS — E46 Unspecified protein-calorie malnutrition: Secondary | ICD-10-CM | POA: Diagnosis not present

## 2015-10-28 DIAGNOSIS — I120 Hypertensive chronic kidney disease with stage 5 chronic kidney disease or end stage renal disease: Secondary | ICD-10-CM | POA: Diagnosis not present

## 2015-10-28 DIAGNOSIS — E1122 Type 2 diabetes mellitus with diabetic chronic kidney disease: Secondary | ICD-10-CM | POA: Diagnosis not present

## 2015-10-29 DIAGNOSIS — N186 End stage renal disease: Secondary | ICD-10-CM | POA: Diagnosis not present

## 2015-10-29 DIAGNOSIS — D509 Iron deficiency anemia, unspecified: Secondary | ICD-10-CM | POA: Diagnosis not present

## 2015-10-29 DIAGNOSIS — Z23 Encounter for immunization: Secondary | ICD-10-CM | POA: Diagnosis not present

## 2015-10-29 DIAGNOSIS — N2581 Secondary hyperparathyroidism of renal origin: Secondary | ICD-10-CM | POA: Diagnosis not present

## 2015-10-29 DIAGNOSIS — Z992 Dependence on renal dialysis: Secondary | ICD-10-CM | POA: Diagnosis not present

## 2015-10-29 DIAGNOSIS — D631 Anemia in chronic kidney disease: Secondary | ICD-10-CM | POA: Diagnosis not present

## 2015-10-30 DIAGNOSIS — I69351 Hemiplegia and hemiparesis following cerebral infarction affecting right dominant side: Secondary | ICD-10-CM | POA: Diagnosis not present

## 2015-10-30 DIAGNOSIS — E1122 Type 2 diabetes mellitus with diabetic chronic kidney disease: Secondary | ICD-10-CM | POA: Diagnosis not present

## 2015-10-30 DIAGNOSIS — E46 Unspecified protein-calorie malnutrition: Secondary | ICD-10-CM | POA: Diagnosis not present

## 2015-10-30 DIAGNOSIS — D631 Anemia in chronic kidney disease: Secondary | ICD-10-CM | POA: Diagnosis not present

## 2015-10-30 DIAGNOSIS — I120 Hypertensive chronic kidney disease with stage 5 chronic kidney disease or end stage renal disease: Secondary | ICD-10-CM | POA: Diagnosis not present

## 2015-10-30 DIAGNOSIS — N186 End stage renal disease: Secondary | ICD-10-CM | POA: Diagnosis not present

## 2015-10-31 DIAGNOSIS — Z23 Encounter for immunization: Secondary | ICD-10-CM | POA: Diagnosis not present

## 2015-10-31 DIAGNOSIS — Z992 Dependence on renal dialysis: Secondary | ICD-10-CM | POA: Diagnosis not present

## 2015-10-31 DIAGNOSIS — D631 Anemia in chronic kidney disease: Secondary | ICD-10-CM | POA: Diagnosis not present

## 2015-10-31 DIAGNOSIS — D509 Iron deficiency anemia, unspecified: Secondary | ICD-10-CM | POA: Diagnosis not present

## 2015-10-31 DIAGNOSIS — N2581 Secondary hyperparathyroidism of renal origin: Secondary | ICD-10-CM | POA: Diagnosis not present

## 2015-10-31 DIAGNOSIS — N186 End stage renal disease: Secondary | ICD-10-CM | POA: Diagnosis not present

## 2015-11-01 DIAGNOSIS — D631 Anemia in chronic kidney disease: Secondary | ICD-10-CM | POA: Diagnosis not present

## 2015-11-01 DIAGNOSIS — N186 End stage renal disease: Secondary | ICD-10-CM | POA: Diagnosis not present

## 2015-11-01 DIAGNOSIS — I69351 Hemiplegia and hemiparesis following cerebral infarction affecting right dominant side: Secondary | ICD-10-CM | POA: Diagnosis not present

## 2015-11-01 DIAGNOSIS — E1122 Type 2 diabetes mellitus with diabetic chronic kidney disease: Secondary | ICD-10-CM | POA: Diagnosis not present

## 2015-11-01 DIAGNOSIS — E46 Unspecified protein-calorie malnutrition: Secondary | ICD-10-CM | POA: Diagnosis not present

## 2015-11-01 DIAGNOSIS — I120 Hypertensive chronic kidney disease with stage 5 chronic kidney disease or end stage renal disease: Secondary | ICD-10-CM | POA: Diagnosis not present

## 2015-11-02 DIAGNOSIS — Z992 Dependence on renal dialysis: Secondary | ICD-10-CM | POA: Diagnosis not present

## 2015-11-02 DIAGNOSIS — D631 Anemia in chronic kidney disease: Secondary | ICD-10-CM | POA: Diagnosis not present

## 2015-11-02 DIAGNOSIS — D509 Iron deficiency anemia, unspecified: Secondary | ICD-10-CM | POA: Diagnosis not present

## 2015-11-02 DIAGNOSIS — N186 End stage renal disease: Secondary | ICD-10-CM | POA: Diagnosis not present

## 2015-11-02 DIAGNOSIS — Z23 Encounter for immunization: Secondary | ICD-10-CM | POA: Diagnosis not present

## 2015-11-02 DIAGNOSIS — N2581 Secondary hyperparathyroidism of renal origin: Secondary | ICD-10-CM | POA: Diagnosis not present

## 2015-11-04 DIAGNOSIS — N2581 Secondary hyperparathyroidism of renal origin: Secondary | ICD-10-CM | POA: Diagnosis not present

## 2015-11-04 DIAGNOSIS — Z992 Dependence on renal dialysis: Secondary | ICD-10-CM | POA: Diagnosis not present

## 2015-11-04 DIAGNOSIS — Z23 Encounter for immunization: Secondary | ICD-10-CM | POA: Diagnosis not present

## 2015-11-04 DIAGNOSIS — D631 Anemia in chronic kidney disease: Secondary | ICD-10-CM | POA: Diagnosis not present

## 2015-11-04 DIAGNOSIS — D509 Iron deficiency anemia, unspecified: Secondary | ICD-10-CM | POA: Diagnosis not present

## 2015-11-04 DIAGNOSIS — N186 End stage renal disease: Secondary | ICD-10-CM | POA: Diagnosis not present

## 2015-11-05 DIAGNOSIS — I69351 Hemiplegia and hemiparesis following cerebral infarction affecting right dominant side: Secondary | ICD-10-CM | POA: Diagnosis not present

## 2015-11-05 DIAGNOSIS — N186 End stage renal disease: Secondary | ICD-10-CM | POA: Diagnosis not present

## 2015-11-05 DIAGNOSIS — I120 Hypertensive chronic kidney disease with stage 5 chronic kidney disease or end stage renal disease: Secondary | ICD-10-CM | POA: Diagnosis not present

## 2015-11-05 DIAGNOSIS — D631 Anemia in chronic kidney disease: Secondary | ICD-10-CM | POA: Diagnosis not present

## 2015-11-05 DIAGNOSIS — E1122 Type 2 diabetes mellitus with diabetic chronic kidney disease: Secondary | ICD-10-CM | POA: Diagnosis not present

## 2015-11-05 DIAGNOSIS — E46 Unspecified protein-calorie malnutrition: Secondary | ICD-10-CM | POA: Diagnosis not present

## 2015-11-06 DIAGNOSIS — I120 Hypertensive chronic kidney disease with stage 5 chronic kidney disease or end stage renal disease: Secondary | ICD-10-CM | POA: Diagnosis not present

## 2015-11-06 DIAGNOSIS — E1122 Type 2 diabetes mellitus with diabetic chronic kidney disease: Secondary | ICD-10-CM | POA: Diagnosis not present

## 2015-11-06 DIAGNOSIS — N186 End stage renal disease: Secondary | ICD-10-CM | POA: Diagnosis not present

## 2015-11-06 DIAGNOSIS — E46 Unspecified protein-calorie malnutrition: Secondary | ICD-10-CM | POA: Diagnosis not present

## 2015-11-06 DIAGNOSIS — D631 Anemia in chronic kidney disease: Secondary | ICD-10-CM | POA: Diagnosis not present

## 2015-11-06 DIAGNOSIS — I69351 Hemiplegia and hemiparesis following cerebral infarction affecting right dominant side: Secondary | ICD-10-CM | POA: Diagnosis not present

## 2015-11-07 DIAGNOSIS — D509 Iron deficiency anemia, unspecified: Secondary | ICD-10-CM | POA: Diagnosis not present

## 2015-11-07 DIAGNOSIS — D631 Anemia in chronic kidney disease: Secondary | ICD-10-CM | POA: Diagnosis not present

## 2015-11-07 DIAGNOSIS — Z23 Encounter for immunization: Secondary | ICD-10-CM | POA: Diagnosis not present

## 2015-11-07 DIAGNOSIS — Z992 Dependence on renal dialysis: Secondary | ICD-10-CM | POA: Diagnosis not present

## 2015-11-07 DIAGNOSIS — N186 End stage renal disease: Secondary | ICD-10-CM | POA: Diagnosis not present

## 2015-11-07 DIAGNOSIS — N2581 Secondary hyperparathyroidism of renal origin: Secondary | ICD-10-CM | POA: Diagnosis not present

## 2015-11-08 DIAGNOSIS — E1122 Type 2 diabetes mellitus with diabetic chronic kidney disease: Secondary | ICD-10-CM | POA: Diagnosis not present

## 2015-11-08 DIAGNOSIS — I120 Hypertensive chronic kidney disease with stage 5 chronic kidney disease or end stage renal disease: Secondary | ICD-10-CM | POA: Diagnosis not present

## 2015-11-08 DIAGNOSIS — D631 Anemia in chronic kidney disease: Secondary | ICD-10-CM | POA: Diagnosis not present

## 2015-11-08 DIAGNOSIS — N186 End stage renal disease: Secondary | ICD-10-CM | POA: Diagnosis not present

## 2015-11-08 DIAGNOSIS — I69351 Hemiplegia and hemiparesis following cerebral infarction affecting right dominant side: Secondary | ICD-10-CM | POA: Diagnosis not present

## 2015-11-08 DIAGNOSIS — E46 Unspecified protein-calorie malnutrition: Secondary | ICD-10-CM | POA: Diagnosis not present

## 2015-11-09 DIAGNOSIS — N186 End stage renal disease: Secondary | ICD-10-CM | POA: Diagnosis not present

## 2015-11-09 DIAGNOSIS — D509 Iron deficiency anemia, unspecified: Secondary | ICD-10-CM | POA: Diagnosis not present

## 2015-11-09 DIAGNOSIS — N2581 Secondary hyperparathyroidism of renal origin: Secondary | ICD-10-CM | POA: Diagnosis not present

## 2015-11-09 DIAGNOSIS — D631 Anemia in chronic kidney disease: Secondary | ICD-10-CM | POA: Diagnosis not present

## 2015-11-09 DIAGNOSIS — Z992 Dependence on renal dialysis: Secondary | ICD-10-CM | POA: Diagnosis not present

## 2015-11-09 DIAGNOSIS — Z23 Encounter for immunization: Secondary | ICD-10-CM | POA: Diagnosis not present

## 2015-11-10 DIAGNOSIS — E46 Unspecified protein-calorie malnutrition: Secondary | ICD-10-CM | POA: Diagnosis not present

## 2015-11-10 DIAGNOSIS — I69351 Hemiplegia and hemiparesis following cerebral infarction affecting right dominant side: Secondary | ICD-10-CM | POA: Diagnosis not present

## 2015-11-10 DIAGNOSIS — D631 Anemia in chronic kidney disease: Secondary | ICD-10-CM | POA: Diagnosis not present

## 2015-11-10 DIAGNOSIS — N186 End stage renal disease: Secondary | ICD-10-CM | POA: Diagnosis not present

## 2015-11-10 DIAGNOSIS — E1122 Type 2 diabetes mellitus with diabetic chronic kidney disease: Secondary | ICD-10-CM | POA: Diagnosis not present

## 2015-11-10 DIAGNOSIS — I120 Hypertensive chronic kidney disease with stage 5 chronic kidney disease or end stage renal disease: Secondary | ICD-10-CM | POA: Diagnosis not present

## 2015-11-12 DIAGNOSIS — Z23 Encounter for immunization: Secondary | ICD-10-CM | POA: Diagnosis not present

## 2015-11-12 DIAGNOSIS — D509 Iron deficiency anemia, unspecified: Secondary | ICD-10-CM | POA: Diagnosis not present

## 2015-11-12 DIAGNOSIS — N2581 Secondary hyperparathyroidism of renal origin: Secondary | ICD-10-CM | POA: Diagnosis not present

## 2015-11-12 DIAGNOSIS — D631 Anemia in chronic kidney disease: Secondary | ICD-10-CM | POA: Diagnosis not present

## 2015-11-12 DIAGNOSIS — Z992 Dependence on renal dialysis: Secondary | ICD-10-CM | POA: Diagnosis not present

## 2015-11-12 DIAGNOSIS — N186 End stage renal disease: Secondary | ICD-10-CM | POA: Diagnosis not present

## 2015-11-14 DIAGNOSIS — N2581 Secondary hyperparathyroidism of renal origin: Secondary | ICD-10-CM | POA: Diagnosis not present

## 2015-11-14 DIAGNOSIS — N186 End stage renal disease: Secondary | ICD-10-CM | POA: Diagnosis not present

## 2015-11-14 DIAGNOSIS — Z23 Encounter for immunization: Secondary | ICD-10-CM | POA: Diagnosis not present

## 2015-11-14 DIAGNOSIS — D631 Anemia in chronic kidney disease: Secondary | ICD-10-CM | POA: Diagnosis not present

## 2015-11-14 DIAGNOSIS — D509 Iron deficiency anemia, unspecified: Secondary | ICD-10-CM | POA: Diagnosis not present

## 2015-11-14 DIAGNOSIS — Z992 Dependence on renal dialysis: Secondary | ICD-10-CM | POA: Diagnosis not present

## 2015-11-16 DIAGNOSIS — D509 Iron deficiency anemia, unspecified: Secondary | ICD-10-CM | POA: Diagnosis not present

## 2015-11-16 DIAGNOSIS — D631 Anemia in chronic kidney disease: Secondary | ICD-10-CM | POA: Diagnosis not present

## 2015-11-16 DIAGNOSIS — Z992 Dependence on renal dialysis: Secondary | ICD-10-CM | POA: Diagnosis not present

## 2015-11-16 DIAGNOSIS — N2581 Secondary hyperparathyroidism of renal origin: Secondary | ICD-10-CM | POA: Diagnosis not present

## 2015-11-16 DIAGNOSIS — Z23 Encounter for immunization: Secondary | ICD-10-CM | POA: Diagnosis not present

## 2015-11-16 DIAGNOSIS — N186 End stage renal disease: Secondary | ICD-10-CM | POA: Diagnosis not present

## 2015-11-19 DIAGNOSIS — Z23 Encounter for immunization: Secondary | ICD-10-CM | POA: Diagnosis not present

## 2015-11-19 DIAGNOSIS — D509 Iron deficiency anemia, unspecified: Secondary | ICD-10-CM | POA: Diagnosis not present

## 2015-11-19 DIAGNOSIS — N186 End stage renal disease: Secondary | ICD-10-CM | POA: Diagnosis not present

## 2015-11-19 DIAGNOSIS — Z992 Dependence on renal dialysis: Secondary | ICD-10-CM | POA: Diagnosis not present

## 2015-11-19 DIAGNOSIS — D631 Anemia in chronic kidney disease: Secondary | ICD-10-CM | POA: Diagnosis not present

## 2015-11-19 DIAGNOSIS — N2581 Secondary hyperparathyroidism of renal origin: Secondary | ICD-10-CM | POA: Diagnosis not present

## 2015-11-21 DIAGNOSIS — N186 End stage renal disease: Secondary | ICD-10-CM | POA: Diagnosis not present

## 2015-11-21 DIAGNOSIS — Z992 Dependence on renal dialysis: Secondary | ICD-10-CM | POA: Diagnosis not present

## 2015-11-21 DIAGNOSIS — N2581 Secondary hyperparathyroidism of renal origin: Secondary | ICD-10-CM | POA: Diagnosis not present

## 2015-11-21 DIAGNOSIS — Z23 Encounter for immunization: Secondary | ICD-10-CM | POA: Diagnosis not present

## 2015-11-21 DIAGNOSIS — D631 Anemia in chronic kidney disease: Secondary | ICD-10-CM | POA: Diagnosis not present

## 2015-11-21 DIAGNOSIS — D509 Iron deficiency anemia, unspecified: Secondary | ICD-10-CM | POA: Diagnosis not present

## 2015-11-23 DIAGNOSIS — D509 Iron deficiency anemia, unspecified: Secondary | ICD-10-CM | POA: Diagnosis not present

## 2015-11-23 DIAGNOSIS — D631 Anemia in chronic kidney disease: Secondary | ICD-10-CM | POA: Diagnosis not present

## 2015-11-23 DIAGNOSIS — N186 End stage renal disease: Secondary | ICD-10-CM | POA: Diagnosis not present

## 2015-11-23 DIAGNOSIS — N2581 Secondary hyperparathyroidism of renal origin: Secondary | ICD-10-CM | POA: Diagnosis not present

## 2015-11-23 DIAGNOSIS — Z23 Encounter for immunization: Secondary | ICD-10-CM | POA: Diagnosis not present

## 2015-11-23 DIAGNOSIS — Z992 Dependence on renal dialysis: Secondary | ICD-10-CM | POA: Diagnosis not present

## 2015-11-26 DIAGNOSIS — N2581 Secondary hyperparathyroidism of renal origin: Secondary | ICD-10-CM | POA: Diagnosis not present

## 2015-11-26 DIAGNOSIS — Z992 Dependence on renal dialysis: Secondary | ICD-10-CM | POA: Diagnosis not present

## 2015-11-26 DIAGNOSIS — N186 End stage renal disease: Secondary | ICD-10-CM | POA: Diagnosis not present

## 2015-11-26 DIAGNOSIS — D509 Iron deficiency anemia, unspecified: Secondary | ICD-10-CM | POA: Diagnosis not present

## 2015-11-26 DIAGNOSIS — D631 Anemia in chronic kidney disease: Secondary | ICD-10-CM | POA: Diagnosis not present

## 2015-11-27 DIAGNOSIS — D631 Anemia in chronic kidney disease: Secondary | ICD-10-CM | POA: Diagnosis not present

## 2015-11-27 DIAGNOSIS — I120 Hypertensive chronic kidney disease with stage 5 chronic kidney disease or end stage renal disease: Secondary | ICD-10-CM | POA: Diagnosis not present

## 2015-11-27 DIAGNOSIS — E46 Unspecified protein-calorie malnutrition: Secondary | ICD-10-CM | POA: Diagnosis not present

## 2015-11-27 DIAGNOSIS — I69351 Hemiplegia and hemiparesis following cerebral infarction affecting right dominant side: Secondary | ICD-10-CM | POA: Diagnosis not present

## 2015-11-27 DIAGNOSIS — N186 End stage renal disease: Secondary | ICD-10-CM | POA: Diagnosis not present

## 2015-11-27 DIAGNOSIS — E1122 Type 2 diabetes mellitus with diabetic chronic kidney disease: Secondary | ICD-10-CM | POA: Diagnosis not present

## 2015-11-28 DIAGNOSIS — D509 Iron deficiency anemia, unspecified: Secondary | ICD-10-CM | POA: Diagnosis not present

## 2015-11-28 DIAGNOSIS — Z992 Dependence on renal dialysis: Secondary | ICD-10-CM | POA: Diagnosis not present

## 2015-11-28 DIAGNOSIS — N2581 Secondary hyperparathyroidism of renal origin: Secondary | ICD-10-CM | POA: Diagnosis not present

## 2015-11-28 DIAGNOSIS — N186 End stage renal disease: Secondary | ICD-10-CM | POA: Diagnosis not present

## 2015-11-28 DIAGNOSIS — D631 Anemia in chronic kidney disease: Secondary | ICD-10-CM | POA: Diagnosis not present

## 2015-11-30 DIAGNOSIS — N186 End stage renal disease: Secondary | ICD-10-CM | POA: Diagnosis not present

## 2015-11-30 DIAGNOSIS — D631 Anemia in chronic kidney disease: Secondary | ICD-10-CM | POA: Diagnosis not present

## 2015-11-30 DIAGNOSIS — N2581 Secondary hyperparathyroidism of renal origin: Secondary | ICD-10-CM | POA: Diagnosis not present

## 2015-11-30 DIAGNOSIS — Z992 Dependence on renal dialysis: Secondary | ICD-10-CM | POA: Diagnosis not present

## 2015-11-30 DIAGNOSIS — D509 Iron deficiency anemia, unspecified: Secondary | ICD-10-CM | POA: Diagnosis not present

## 2015-12-03 DIAGNOSIS — N186 End stage renal disease: Secondary | ICD-10-CM | POA: Diagnosis not present

## 2015-12-03 DIAGNOSIS — D509 Iron deficiency anemia, unspecified: Secondary | ICD-10-CM | POA: Diagnosis not present

## 2015-12-03 DIAGNOSIS — Z992 Dependence on renal dialysis: Secondary | ICD-10-CM | POA: Diagnosis not present

## 2015-12-03 DIAGNOSIS — N2581 Secondary hyperparathyroidism of renal origin: Secondary | ICD-10-CM | POA: Diagnosis not present

## 2015-12-03 DIAGNOSIS — D631 Anemia in chronic kidney disease: Secondary | ICD-10-CM | POA: Diagnosis not present

## 2015-12-05 DIAGNOSIS — Z992 Dependence on renal dialysis: Secondary | ICD-10-CM | POA: Diagnosis not present

## 2015-12-05 DIAGNOSIS — N2581 Secondary hyperparathyroidism of renal origin: Secondary | ICD-10-CM | POA: Diagnosis not present

## 2015-12-05 DIAGNOSIS — D509 Iron deficiency anemia, unspecified: Secondary | ICD-10-CM | POA: Diagnosis not present

## 2015-12-05 DIAGNOSIS — D631 Anemia in chronic kidney disease: Secondary | ICD-10-CM | POA: Diagnosis not present

## 2015-12-05 DIAGNOSIS — N186 End stage renal disease: Secondary | ICD-10-CM | POA: Diagnosis not present

## 2015-12-07 DIAGNOSIS — N186 End stage renal disease: Secondary | ICD-10-CM | POA: Diagnosis not present

## 2015-12-07 DIAGNOSIS — N2581 Secondary hyperparathyroidism of renal origin: Secondary | ICD-10-CM | POA: Diagnosis not present

## 2015-12-07 DIAGNOSIS — Z992 Dependence on renal dialysis: Secondary | ICD-10-CM | POA: Diagnosis not present

## 2015-12-07 DIAGNOSIS — D509 Iron deficiency anemia, unspecified: Secondary | ICD-10-CM | POA: Diagnosis not present

## 2015-12-07 DIAGNOSIS — D631 Anemia in chronic kidney disease: Secondary | ICD-10-CM | POA: Diagnosis not present

## 2015-12-09 DIAGNOSIS — D631 Anemia in chronic kidney disease: Secondary | ICD-10-CM | POA: Diagnosis not present

## 2015-12-09 DIAGNOSIS — E1122 Type 2 diabetes mellitus with diabetic chronic kidney disease: Secondary | ICD-10-CM | POA: Diagnosis not present

## 2015-12-09 DIAGNOSIS — I120 Hypertensive chronic kidney disease with stage 5 chronic kidney disease or end stage renal disease: Secondary | ICD-10-CM | POA: Diagnosis not present

## 2015-12-09 DIAGNOSIS — I69351 Hemiplegia and hemiparesis following cerebral infarction affecting right dominant side: Secondary | ICD-10-CM | POA: Diagnosis not present

## 2015-12-09 DIAGNOSIS — N186 End stage renal disease: Secondary | ICD-10-CM | POA: Diagnosis not present

## 2015-12-09 DIAGNOSIS — E46 Unspecified protein-calorie malnutrition: Secondary | ICD-10-CM | POA: Diagnosis not present

## 2015-12-10 DIAGNOSIS — N2581 Secondary hyperparathyroidism of renal origin: Secondary | ICD-10-CM | POA: Diagnosis not present

## 2015-12-10 DIAGNOSIS — Z992 Dependence on renal dialysis: Secondary | ICD-10-CM | POA: Diagnosis not present

## 2015-12-10 DIAGNOSIS — D509 Iron deficiency anemia, unspecified: Secondary | ICD-10-CM | POA: Diagnosis not present

## 2015-12-10 DIAGNOSIS — D631 Anemia in chronic kidney disease: Secondary | ICD-10-CM | POA: Diagnosis not present

## 2015-12-10 DIAGNOSIS — N186 End stage renal disease: Secondary | ICD-10-CM | POA: Diagnosis not present

## 2015-12-12 DIAGNOSIS — N186 End stage renal disease: Secondary | ICD-10-CM | POA: Diagnosis not present

## 2015-12-12 DIAGNOSIS — Z992 Dependence on renal dialysis: Secondary | ICD-10-CM | POA: Diagnosis not present

## 2015-12-12 DIAGNOSIS — D509 Iron deficiency anemia, unspecified: Secondary | ICD-10-CM | POA: Diagnosis not present

## 2015-12-12 DIAGNOSIS — D631 Anemia in chronic kidney disease: Secondary | ICD-10-CM | POA: Diagnosis not present

## 2015-12-12 DIAGNOSIS — N2581 Secondary hyperparathyroidism of renal origin: Secondary | ICD-10-CM | POA: Diagnosis not present

## 2015-12-14 DIAGNOSIS — N186 End stage renal disease: Secondary | ICD-10-CM | POA: Diagnosis not present

## 2015-12-14 DIAGNOSIS — N2581 Secondary hyperparathyroidism of renal origin: Secondary | ICD-10-CM | POA: Diagnosis not present

## 2015-12-14 DIAGNOSIS — Z992 Dependence on renal dialysis: Secondary | ICD-10-CM | POA: Diagnosis not present

## 2015-12-14 DIAGNOSIS — D631 Anemia in chronic kidney disease: Secondary | ICD-10-CM | POA: Diagnosis not present

## 2015-12-14 DIAGNOSIS — D509 Iron deficiency anemia, unspecified: Secondary | ICD-10-CM | POA: Diagnosis not present

## 2015-12-17 DIAGNOSIS — D631 Anemia in chronic kidney disease: Secondary | ICD-10-CM | POA: Diagnosis not present

## 2015-12-17 DIAGNOSIS — D509 Iron deficiency anemia, unspecified: Secondary | ICD-10-CM | POA: Diagnosis not present

## 2015-12-17 DIAGNOSIS — Z992 Dependence on renal dialysis: Secondary | ICD-10-CM | POA: Diagnosis not present

## 2015-12-17 DIAGNOSIS — N2581 Secondary hyperparathyroidism of renal origin: Secondary | ICD-10-CM | POA: Diagnosis not present

## 2015-12-17 DIAGNOSIS — N186 End stage renal disease: Secondary | ICD-10-CM | POA: Diagnosis not present

## 2015-12-19 DIAGNOSIS — N186 End stage renal disease: Secondary | ICD-10-CM | POA: Diagnosis not present

## 2015-12-19 DIAGNOSIS — Z992 Dependence on renal dialysis: Secondary | ICD-10-CM | POA: Diagnosis not present

## 2015-12-19 DIAGNOSIS — D631 Anemia in chronic kidney disease: Secondary | ICD-10-CM | POA: Diagnosis not present

## 2015-12-19 DIAGNOSIS — D509 Iron deficiency anemia, unspecified: Secondary | ICD-10-CM | POA: Diagnosis not present

## 2015-12-19 DIAGNOSIS — N2581 Secondary hyperparathyroidism of renal origin: Secondary | ICD-10-CM | POA: Diagnosis not present

## 2015-12-21 DIAGNOSIS — Z992 Dependence on renal dialysis: Secondary | ICD-10-CM | POA: Diagnosis not present

## 2015-12-21 DIAGNOSIS — N2581 Secondary hyperparathyroidism of renal origin: Secondary | ICD-10-CM | POA: Diagnosis not present

## 2015-12-21 DIAGNOSIS — N186 End stage renal disease: Secondary | ICD-10-CM | POA: Diagnosis not present

## 2015-12-21 DIAGNOSIS — D509 Iron deficiency anemia, unspecified: Secondary | ICD-10-CM | POA: Diagnosis not present

## 2015-12-21 DIAGNOSIS — D631 Anemia in chronic kidney disease: Secondary | ICD-10-CM | POA: Diagnosis not present

## 2015-12-24 DIAGNOSIS — N2581 Secondary hyperparathyroidism of renal origin: Secondary | ICD-10-CM | POA: Diagnosis not present

## 2015-12-24 DIAGNOSIS — Z992 Dependence on renal dialysis: Secondary | ICD-10-CM | POA: Diagnosis not present

## 2015-12-24 DIAGNOSIS — D509 Iron deficiency anemia, unspecified: Secondary | ICD-10-CM | POA: Diagnosis not present

## 2015-12-24 DIAGNOSIS — D631 Anemia in chronic kidney disease: Secondary | ICD-10-CM | POA: Diagnosis not present

## 2015-12-24 DIAGNOSIS — N186 End stage renal disease: Secondary | ICD-10-CM | POA: Diagnosis not present

## 2015-12-26 DIAGNOSIS — N186 End stage renal disease: Secondary | ICD-10-CM | POA: Diagnosis not present

## 2015-12-26 DIAGNOSIS — D509 Iron deficiency anemia, unspecified: Secondary | ICD-10-CM | POA: Diagnosis not present

## 2015-12-26 DIAGNOSIS — N2581 Secondary hyperparathyroidism of renal origin: Secondary | ICD-10-CM | POA: Diagnosis not present

## 2015-12-26 DIAGNOSIS — Z992 Dependence on renal dialysis: Secondary | ICD-10-CM | POA: Diagnosis not present

## 2015-12-26 DIAGNOSIS — D631 Anemia in chronic kidney disease: Secondary | ICD-10-CM | POA: Diagnosis not present

## 2015-12-28 DIAGNOSIS — N2581 Secondary hyperparathyroidism of renal origin: Secondary | ICD-10-CM | POA: Diagnosis not present

## 2015-12-28 DIAGNOSIS — D631 Anemia in chronic kidney disease: Secondary | ICD-10-CM | POA: Diagnosis not present

## 2015-12-28 DIAGNOSIS — D509 Iron deficiency anemia, unspecified: Secondary | ICD-10-CM | POA: Diagnosis not present

## 2015-12-28 DIAGNOSIS — Z992 Dependence on renal dialysis: Secondary | ICD-10-CM | POA: Diagnosis not present

## 2015-12-28 DIAGNOSIS — N186 End stage renal disease: Secondary | ICD-10-CM | POA: Diagnosis not present

## 2015-12-31 DIAGNOSIS — D631 Anemia in chronic kidney disease: Secondary | ICD-10-CM | POA: Diagnosis not present

## 2015-12-31 DIAGNOSIS — D509 Iron deficiency anemia, unspecified: Secondary | ICD-10-CM | POA: Diagnosis not present

## 2015-12-31 DIAGNOSIS — N2581 Secondary hyperparathyroidism of renal origin: Secondary | ICD-10-CM | POA: Diagnosis not present

## 2015-12-31 DIAGNOSIS — N186 End stage renal disease: Secondary | ICD-10-CM | POA: Diagnosis not present

## 2015-12-31 DIAGNOSIS — Z992 Dependence on renal dialysis: Secondary | ICD-10-CM | POA: Diagnosis not present

## 2016-01-02 DIAGNOSIS — Z992 Dependence on renal dialysis: Secondary | ICD-10-CM | POA: Diagnosis not present

## 2016-01-02 DIAGNOSIS — N186 End stage renal disease: Secondary | ICD-10-CM | POA: Diagnosis not present

## 2016-01-02 DIAGNOSIS — D509 Iron deficiency anemia, unspecified: Secondary | ICD-10-CM | POA: Diagnosis not present

## 2016-01-02 DIAGNOSIS — D631 Anemia in chronic kidney disease: Secondary | ICD-10-CM | POA: Diagnosis not present

## 2016-01-02 DIAGNOSIS — N2581 Secondary hyperparathyroidism of renal origin: Secondary | ICD-10-CM | POA: Diagnosis not present

## 2016-01-04 DIAGNOSIS — D509 Iron deficiency anemia, unspecified: Secondary | ICD-10-CM | POA: Diagnosis not present

## 2016-01-04 DIAGNOSIS — N2581 Secondary hyperparathyroidism of renal origin: Secondary | ICD-10-CM | POA: Diagnosis not present

## 2016-01-04 DIAGNOSIS — D631 Anemia in chronic kidney disease: Secondary | ICD-10-CM | POA: Diagnosis not present

## 2016-01-04 DIAGNOSIS — N186 End stage renal disease: Secondary | ICD-10-CM | POA: Diagnosis not present

## 2016-01-04 DIAGNOSIS — Z992 Dependence on renal dialysis: Secondary | ICD-10-CM | POA: Diagnosis not present

## 2016-01-07 DIAGNOSIS — N186 End stage renal disease: Secondary | ICD-10-CM | POA: Diagnosis not present

## 2016-01-07 DIAGNOSIS — D631 Anemia in chronic kidney disease: Secondary | ICD-10-CM | POA: Diagnosis not present

## 2016-01-07 DIAGNOSIS — Z992 Dependence on renal dialysis: Secondary | ICD-10-CM | POA: Diagnosis not present

## 2016-01-07 DIAGNOSIS — N2581 Secondary hyperparathyroidism of renal origin: Secondary | ICD-10-CM | POA: Diagnosis not present

## 2016-01-07 DIAGNOSIS — D509 Iron deficiency anemia, unspecified: Secondary | ICD-10-CM | POA: Diagnosis not present

## 2016-01-09 DIAGNOSIS — D509 Iron deficiency anemia, unspecified: Secondary | ICD-10-CM | POA: Diagnosis not present

## 2016-01-09 DIAGNOSIS — D631 Anemia in chronic kidney disease: Secondary | ICD-10-CM | POA: Diagnosis not present

## 2016-01-09 DIAGNOSIS — N186 End stage renal disease: Secondary | ICD-10-CM | POA: Diagnosis not present

## 2016-01-09 DIAGNOSIS — Z992 Dependence on renal dialysis: Secondary | ICD-10-CM | POA: Diagnosis not present

## 2016-01-09 DIAGNOSIS — N2581 Secondary hyperparathyroidism of renal origin: Secondary | ICD-10-CM | POA: Diagnosis not present

## 2016-01-11 DIAGNOSIS — N186 End stage renal disease: Secondary | ICD-10-CM | POA: Diagnosis not present

## 2016-01-11 DIAGNOSIS — Z992 Dependence on renal dialysis: Secondary | ICD-10-CM | POA: Diagnosis not present

## 2016-01-11 DIAGNOSIS — D631 Anemia in chronic kidney disease: Secondary | ICD-10-CM | POA: Diagnosis not present

## 2016-01-11 DIAGNOSIS — D509 Iron deficiency anemia, unspecified: Secondary | ICD-10-CM | POA: Diagnosis not present

## 2016-01-11 DIAGNOSIS — N2581 Secondary hyperparathyroidism of renal origin: Secondary | ICD-10-CM | POA: Diagnosis not present

## 2016-01-14 DIAGNOSIS — N186 End stage renal disease: Secondary | ICD-10-CM | POA: Diagnosis not present

## 2016-01-14 DIAGNOSIS — N2581 Secondary hyperparathyroidism of renal origin: Secondary | ICD-10-CM | POA: Diagnosis not present

## 2016-01-14 DIAGNOSIS — D631 Anemia in chronic kidney disease: Secondary | ICD-10-CM | POA: Diagnosis not present

## 2016-01-14 DIAGNOSIS — Z992 Dependence on renal dialysis: Secondary | ICD-10-CM | POA: Diagnosis not present

## 2016-01-14 DIAGNOSIS — D509 Iron deficiency anemia, unspecified: Secondary | ICD-10-CM | POA: Diagnosis not present

## 2016-01-16 DIAGNOSIS — N186 End stage renal disease: Secondary | ICD-10-CM | POA: Diagnosis not present

## 2016-01-16 DIAGNOSIS — D509 Iron deficiency anemia, unspecified: Secondary | ICD-10-CM | POA: Diagnosis not present

## 2016-01-16 DIAGNOSIS — N2581 Secondary hyperparathyroidism of renal origin: Secondary | ICD-10-CM | POA: Diagnosis not present

## 2016-01-16 DIAGNOSIS — D631 Anemia in chronic kidney disease: Secondary | ICD-10-CM | POA: Diagnosis not present

## 2016-01-16 DIAGNOSIS — Z992 Dependence on renal dialysis: Secondary | ICD-10-CM | POA: Diagnosis not present

## 2016-01-18 DIAGNOSIS — N2581 Secondary hyperparathyroidism of renal origin: Secondary | ICD-10-CM | POA: Diagnosis not present

## 2016-01-18 DIAGNOSIS — D631 Anemia in chronic kidney disease: Secondary | ICD-10-CM | POA: Diagnosis not present

## 2016-01-18 DIAGNOSIS — Z992 Dependence on renal dialysis: Secondary | ICD-10-CM | POA: Diagnosis not present

## 2016-01-18 DIAGNOSIS — N186 End stage renal disease: Secondary | ICD-10-CM | POA: Diagnosis not present

## 2016-01-18 DIAGNOSIS — D509 Iron deficiency anemia, unspecified: Secondary | ICD-10-CM | POA: Diagnosis not present

## 2016-01-21 DIAGNOSIS — N2581 Secondary hyperparathyroidism of renal origin: Secondary | ICD-10-CM | POA: Diagnosis not present

## 2016-01-21 DIAGNOSIS — N186 End stage renal disease: Secondary | ICD-10-CM | POA: Diagnosis not present

## 2016-01-21 DIAGNOSIS — D631 Anemia in chronic kidney disease: Secondary | ICD-10-CM | POA: Diagnosis not present

## 2016-01-21 DIAGNOSIS — D509 Iron deficiency anemia, unspecified: Secondary | ICD-10-CM | POA: Diagnosis not present

## 2016-01-21 DIAGNOSIS — Z992 Dependence on renal dialysis: Secondary | ICD-10-CM | POA: Diagnosis not present

## 2016-01-23 DIAGNOSIS — D509 Iron deficiency anemia, unspecified: Secondary | ICD-10-CM | POA: Diagnosis not present

## 2016-01-23 DIAGNOSIS — N186 End stage renal disease: Secondary | ICD-10-CM | POA: Diagnosis not present

## 2016-01-23 DIAGNOSIS — N2581 Secondary hyperparathyroidism of renal origin: Secondary | ICD-10-CM | POA: Diagnosis not present

## 2016-01-23 DIAGNOSIS — D631 Anemia in chronic kidney disease: Secondary | ICD-10-CM | POA: Diagnosis not present

## 2016-01-23 DIAGNOSIS — Z992 Dependence on renal dialysis: Secondary | ICD-10-CM | POA: Diagnosis not present

## 2016-01-24 DIAGNOSIS — I10 Essential (primary) hypertension: Secondary | ICD-10-CM | POA: Diagnosis not present

## 2016-01-24 DIAGNOSIS — F3289 Other specified depressive episodes: Secondary | ICD-10-CM | POA: Diagnosis not present

## 2016-01-24 DIAGNOSIS — E1121 Type 2 diabetes mellitus with diabetic nephropathy: Secondary | ICD-10-CM | POA: Diagnosis not present

## 2016-01-25 DIAGNOSIS — N2581 Secondary hyperparathyroidism of renal origin: Secondary | ICD-10-CM | POA: Diagnosis not present

## 2016-01-25 DIAGNOSIS — Z992 Dependence on renal dialysis: Secondary | ICD-10-CM | POA: Diagnosis not present

## 2016-01-25 DIAGNOSIS — D631 Anemia in chronic kidney disease: Secondary | ICD-10-CM | POA: Diagnosis not present

## 2016-01-25 DIAGNOSIS — N186 End stage renal disease: Secondary | ICD-10-CM | POA: Diagnosis not present

## 2016-01-25 DIAGNOSIS — D509 Iron deficiency anemia, unspecified: Secondary | ICD-10-CM | POA: Diagnosis not present

## 2016-01-28 DIAGNOSIS — N2581 Secondary hyperparathyroidism of renal origin: Secondary | ICD-10-CM | POA: Diagnosis not present

## 2016-01-28 DIAGNOSIS — D509 Iron deficiency anemia, unspecified: Secondary | ICD-10-CM | POA: Diagnosis not present

## 2016-01-28 DIAGNOSIS — Z992 Dependence on renal dialysis: Secondary | ICD-10-CM | POA: Diagnosis not present

## 2016-01-28 DIAGNOSIS — D631 Anemia in chronic kidney disease: Secondary | ICD-10-CM | POA: Diagnosis not present

## 2016-01-28 DIAGNOSIS — N186 End stage renal disease: Secondary | ICD-10-CM | POA: Diagnosis not present

## 2016-01-30 DIAGNOSIS — D631 Anemia in chronic kidney disease: Secondary | ICD-10-CM | POA: Diagnosis not present

## 2016-01-30 DIAGNOSIS — N2581 Secondary hyperparathyroidism of renal origin: Secondary | ICD-10-CM | POA: Diagnosis not present

## 2016-01-30 DIAGNOSIS — N186 End stage renal disease: Secondary | ICD-10-CM | POA: Diagnosis not present

## 2016-01-30 DIAGNOSIS — D509 Iron deficiency anemia, unspecified: Secondary | ICD-10-CM | POA: Diagnosis not present

## 2016-01-30 DIAGNOSIS — Z992 Dependence on renal dialysis: Secondary | ICD-10-CM | POA: Diagnosis not present

## 2016-02-01 DIAGNOSIS — D631 Anemia in chronic kidney disease: Secondary | ICD-10-CM | POA: Diagnosis not present

## 2016-02-01 DIAGNOSIS — N186 End stage renal disease: Secondary | ICD-10-CM | POA: Diagnosis not present

## 2016-02-01 DIAGNOSIS — Z992 Dependence on renal dialysis: Secondary | ICD-10-CM | POA: Diagnosis not present

## 2016-02-01 DIAGNOSIS — D509 Iron deficiency anemia, unspecified: Secondary | ICD-10-CM | POA: Diagnosis not present

## 2016-02-01 DIAGNOSIS — N2581 Secondary hyperparathyroidism of renal origin: Secondary | ICD-10-CM | POA: Diagnosis not present

## 2016-02-04 DIAGNOSIS — N2581 Secondary hyperparathyroidism of renal origin: Secondary | ICD-10-CM | POA: Diagnosis not present

## 2016-02-04 DIAGNOSIS — Z992 Dependence on renal dialysis: Secondary | ICD-10-CM | POA: Diagnosis not present

## 2016-02-04 DIAGNOSIS — D509 Iron deficiency anemia, unspecified: Secondary | ICD-10-CM | POA: Diagnosis not present

## 2016-02-04 DIAGNOSIS — D631 Anemia in chronic kidney disease: Secondary | ICD-10-CM | POA: Diagnosis not present

## 2016-02-04 DIAGNOSIS — N186 End stage renal disease: Secondary | ICD-10-CM | POA: Diagnosis not present

## 2016-02-06 DIAGNOSIS — N2581 Secondary hyperparathyroidism of renal origin: Secondary | ICD-10-CM | POA: Diagnosis not present

## 2016-02-06 DIAGNOSIS — D509 Iron deficiency anemia, unspecified: Secondary | ICD-10-CM | POA: Diagnosis not present

## 2016-02-06 DIAGNOSIS — Z992 Dependence on renal dialysis: Secondary | ICD-10-CM | POA: Diagnosis not present

## 2016-02-06 DIAGNOSIS — N186 End stage renal disease: Secondary | ICD-10-CM | POA: Diagnosis not present

## 2016-02-06 DIAGNOSIS — D631 Anemia in chronic kidney disease: Secondary | ICD-10-CM | POA: Diagnosis not present

## 2016-02-08 DIAGNOSIS — N186 End stage renal disease: Secondary | ICD-10-CM | POA: Diagnosis not present

## 2016-02-08 DIAGNOSIS — D631 Anemia in chronic kidney disease: Secondary | ICD-10-CM | POA: Diagnosis not present

## 2016-02-08 DIAGNOSIS — D509 Iron deficiency anemia, unspecified: Secondary | ICD-10-CM | POA: Diagnosis not present

## 2016-02-08 DIAGNOSIS — Z992 Dependence on renal dialysis: Secondary | ICD-10-CM | POA: Diagnosis not present

## 2016-02-08 DIAGNOSIS — N2581 Secondary hyperparathyroidism of renal origin: Secondary | ICD-10-CM | POA: Diagnosis not present

## 2016-02-11 DIAGNOSIS — N186 End stage renal disease: Secondary | ICD-10-CM | POA: Diagnosis not present

## 2016-02-11 DIAGNOSIS — D631 Anemia in chronic kidney disease: Secondary | ICD-10-CM | POA: Diagnosis not present

## 2016-02-11 DIAGNOSIS — Z992 Dependence on renal dialysis: Secondary | ICD-10-CM | POA: Diagnosis not present

## 2016-02-11 DIAGNOSIS — D509 Iron deficiency anemia, unspecified: Secondary | ICD-10-CM | POA: Diagnosis not present

## 2016-02-11 DIAGNOSIS — N2581 Secondary hyperparathyroidism of renal origin: Secondary | ICD-10-CM | POA: Diagnosis not present

## 2016-02-13 DIAGNOSIS — Z992 Dependence on renal dialysis: Secondary | ICD-10-CM | POA: Diagnosis not present

## 2016-02-13 DIAGNOSIS — N186 End stage renal disease: Secondary | ICD-10-CM | POA: Diagnosis not present

## 2016-02-13 DIAGNOSIS — D509 Iron deficiency anemia, unspecified: Secondary | ICD-10-CM | POA: Diagnosis not present

## 2016-02-13 DIAGNOSIS — E119 Type 2 diabetes mellitus without complications: Secondary | ICD-10-CM | POA: Diagnosis not present

## 2016-02-13 DIAGNOSIS — D631 Anemia in chronic kidney disease: Secondary | ICD-10-CM | POA: Diagnosis not present

## 2016-02-13 DIAGNOSIS — N2581 Secondary hyperparathyroidism of renal origin: Secondary | ICD-10-CM | POA: Diagnosis not present

## 2016-02-15 DIAGNOSIS — N2581 Secondary hyperparathyroidism of renal origin: Secondary | ICD-10-CM | POA: Diagnosis not present

## 2016-02-15 DIAGNOSIS — D631 Anemia in chronic kidney disease: Secondary | ICD-10-CM | POA: Diagnosis not present

## 2016-02-15 DIAGNOSIS — D509 Iron deficiency anemia, unspecified: Secondary | ICD-10-CM | POA: Diagnosis not present

## 2016-02-15 DIAGNOSIS — N186 End stage renal disease: Secondary | ICD-10-CM | POA: Diagnosis not present

## 2016-02-15 DIAGNOSIS — Z992 Dependence on renal dialysis: Secondary | ICD-10-CM | POA: Diagnosis not present

## 2016-02-18 DIAGNOSIS — N186 End stage renal disease: Secondary | ICD-10-CM | POA: Diagnosis not present

## 2016-02-18 DIAGNOSIS — D509 Iron deficiency anemia, unspecified: Secondary | ICD-10-CM | POA: Diagnosis not present

## 2016-02-18 DIAGNOSIS — D631 Anemia in chronic kidney disease: Secondary | ICD-10-CM | POA: Diagnosis not present

## 2016-02-18 DIAGNOSIS — N2581 Secondary hyperparathyroidism of renal origin: Secondary | ICD-10-CM | POA: Diagnosis not present

## 2016-02-18 DIAGNOSIS — Z992 Dependence on renal dialysis: Secondary | ICD-10-CM | POA: Diagnosis not present

## 2016-02-20 DIAGNOSIS — D631 Anemia in chronic kidney disease: Secondary | ICD-10-CM | POA: Diagnosis not present

## 2016-02-20 DIAGNOSIS — D509 Iron deficiency anemia, unspecified: Secondary | ICD-10-CM | POA: Diagnosis not present

## 2016-02-20 DIAGNOSIS — Z992 Dependence on renal dialysis: Secondary | ICD-10-CM | POA: Diagnosis not present

## 2016-02-20 DIAGNOSIS — N2581 Secondary hyperparathyroidism of renal origin: Secondary | ICD-10-CM | POA: Diagnosis not present

## 2016-02-20 DIAGNOSIS — N186 End stage renal disease: Secondary | ICD-10-CM | POA: Diagnosis not present

## 2016-02-22 DIAGNOSIS — D631 Anemia in chronic kidney disease: Secondary | ICD-10-CM | POA: Diagnosis not present

## 2016-02-22 DIAGNOSIS — D509 Iron deficiency anemia, unspecified: Secondary | ICD-10-CM | POA: Diagnosis not present

## 2016-02-22 DIAGNOSIS — Z992 Dependence on renal dialysis: Secondary | ICD-10-CM | POA: Diagnosis not present

## 2016-02-22 DIAGNOSIS — N186 End stage renal disease: Secondary | ICD-10-CM | POA: Diagnosis not present

## 2016-02-22 DIAGNOSIS — N2581 Secondary hyperparathyroidism of renal origin: Secondary | ICD-10-CM | POA: Diagnosis not present

## 2016-02-23 DIAGNOSIS — N186 End stage renal disease: Secondary | ICD-10-CM | POA: Diagnosis not present

## 2016-02-23 DIAGNOSIS — Z992 Dependence on renal dialysis: Secondary | ICD-10-CM | POA: Diagnosis not present

## 2016-02-25 DIAGNOSIS — D631 Anemia in chronic kidney disease: Secondary | ICD-10-CM | POA: Diagnosis not present

## 2016-02-25 DIAGNOSIS — Z23 Encounter for immunization: Secondary | ICD-10-CM | POA: Diagnosis not present

## 2016-02-25 DIAGNOSIS — D509 Iron deficiency anemia, unspecified: Secondary | ICD-10-CM | POA: Diagnosis not present

## 2016-02-25 DIAGNOSIS — Z992 Dependence on renal dialysis: Secondary | ICD-10-CM | POA: Diagnosis not present

## 2016-02-25 DIAGNOSIS — N2581 Secondary hyperparathyroidism of renal origin: Secondary | ICD-10-CM | POA: Diagnosis not present

## 2016-02-25 DIAGNOSIS — N186 End stage renal disease: Secondary | ICD-10-CM | POA: Diagnosis not present

## 2016-02-27 DIAGNOSIS — D631 Anemia in chronic kidney disease: Secondary | ICD-10-CM | POA: Diagnosis not present

## 2016-02-27 DIAGNOSIS — Z23 Encounter for immunization: Secondary | ICD-10-CM | POA: Diagnosis not present

## 2016-02-27 DIAGNOSIS — Z992 Dependence on renal dialysis: Secondary | ICD-10-CM | POA: Diagnosis not present

## 2016-02-27 DIAGNOSIS — N2581 Secondary hyperparathyroidism of renal origin: Secondary | ICD-10-CM | POA: Diagnosis not present

## 2016-02-27 DIAGNOSIS — D509 Iron deficiency anemia, unspecified: Secondary | ICD-10-CM | POA: Diagnosis not present

## 2016-02-27 DIAGNOSIS — N186 End stage renal disease: Secondary | ICD-10-CM | POA: Diagnosis not present

## 2016-02-29 DIAGNOSIS — D631 Anemia in chronic kidney disease: Secondary | ICD-10-CM | POA: Diagnosis not present

## 2016-02-29 DIAGNOSIS — D509 Iron deficiency anemia, unspecified: Secondary | ICD-10-CM | POA: Diagnosis not present

## 2016-02-29 DIAGNOSIS — N186 End stage renal disease: Secondary | ICD-10-CM | POA: Diagnosis not present

## 2016-02-29 DIAGNOSIS — N2581 Secondary hyperparathyroidism of renal origin: Secondary | ICD-10-CM | POA: Diagnosis not present

## 2016-02-29 DIAGNOSIS — Z992 Dependence on renal dialysis: Secondary | ICD-10-CM | POA: Diagnosis not present

## 2016-02-29 DIAGNOSIS — Z23 Encounter for immunization: Secondary | ICD-10-CM | POA: Diagnosis not present

## 2016-03-03 DIAGNOSIS — Z992 Dependence on renal dialysis: Secondary | ICD-10-CM | POA: Diagnosis not present

## 2016-03-03 DIAGNOSIS — N2581 Secondary hyperparathyroidism of renal origin: Secondary | ICD-10-CM | POA: Diagnosis not present

## 2016-03-03 DIAGNOSIS — N186 End stage renal disease: Secondary | ICD-10-CM | POA: Diagnosis not present

## 2016-03-03 DIAGNOSIS — Z23 Encounter for immunization: Secondary | ICD-10-CM | POA: Diagnosis not present

## 2016-03-03 DIAGNOSIS — D509 Iron deficiency anemia, unspecified: Secondary | ICD-10-CM | POA: Diagnosis not present

## 2016-03-03 DIAGNOSIS — D631 Anemia in chronic kidney disease: Secondary | ICD-10-CM | POA: Diagnosis not present

## 2016-03-05 DIAGNOSIS — D509 Iron deficiency anemia, unspecified: Secondary | ICD-10-CM | POA: Diagnosis not present

## 2016-03-05 DIAGNOSIS — N186 End stage renal disease: Secondary | ICD-10-CM | POA: Diagnosis not present

## 2016-03-05 DIAGNOSIS — D631 Anemia in chronic kidney disease: Secondary | ICD-10-CM | POA: Diagnosis not present

## 2016-03-05 DIAGNOSIS — N2581 Secondary hyperparathyroidism of renal origin: Secondary | ICD-10-CM | POA: Diagnosis not present

## 2016-03-05 DIAGNOSIS — Z23 Encounter for immunization: Secondary | ICD-10-CM | POA: Diagnosis not present

## 2016-03-05 DIAGNOSIS — Z992 Dependence on renal dialysis: Secondary | ICD-10-CM | POA: Diagnosis not present

## 2016-03-07 DIAGNOSIS — D509 Iron deficiency anemia, unspecified: Secondary | ICD-10-CM | POA: Diagnosis not present

## 2016-03-07 DIAGNOSIS — N186 End stage renal disease: Secondary | ICD-10-CM | POA: Diagnosis not present

## 2016-03-07 DIAGNOSIS — Z23 Encounter for immunization: Secondary | ICD-10-CM | POA: Diagnosis not present

## 2016-03-07 DIAGNOSIS — N2581 Secondary hyperparathyroidism of renal origin: Secondary | ICD-10-CM | POA: Diagnosis not present

## 2016-03-07 DIAGNOSIS — D631 Anemia in chronic kidney disease: Secondary | ICD-10-CM | POA: Diagnosis not present

## 2016-03-07 DIAGNOSIS — Z992 Dependence on renal dialysis: Secondary | ICD-10-CM | POA: Diagnosis not present

## 2016-03-10 DIAGNOSIS — D509 Iron deficiency anemia, unspecified: Secondary | ICD-10-CM | POA: Diagnosis not present

## 2016-03-10 DIAGNOSIS — D631 Anemia in chronic kidney disease: Secondary | ICD-10-CM | POA: Diagnosis not present

## 2016-03-10 DIAGNOSIS — Z992 Dependence on renal dialysis: Secondary | ICD-10-CM | POA: Diagnosis not present

## 2016-03-10 DIAGNOSIS — N186 End stage renal disease: Secondary | ICD-10-CM | POA: Diagnosis not present

## 2016-03-10 DIAGNOSIS — N2581 Secondary hyperparathyroidism of renal origin: Secondary | ICD-10-CM | POA: Diagnosis not present

## 2016-03-10 DIAGNOSIS — Z23 Encounter for immunization: Secondary | ICD-10-CM | POA: Diagnosis not present

## 2016-03-12 DIAGNOSIS — D631 Anemia in chronic kidney disease: Secondary | ICD-10-CM | POA: Diagnosis not present

## 2016-03-12 DIAGNOSIS — D509 Iron deficiency anemia, unspecified: Secondary | ICD-10-CM | POA: Diagnosis not present

## 2016-03-12 DIAGNOSIS — N186 End stage renal disease: Secondary | ICD-10-CM | POA: Diagnosis not present

## 2016-03-12 DIAGNOSIS — Z992 Dependence on renal dialysis: Secondary | ICD-10-CM | POA: Diagnosis not present

## 2016-03-12 DIAGNOSIS — N2581 Secondary hyperparathyroidism of renal origin: Secondary | ICD-10-CM | POA: Diagnosis not present

## 2016-03-12 DIAGNOSIS — Z23 Encounter for immunization: Secondary | ICD-10-CM | POA: Diagnosis not present

## 2016-03-14 DIAGNOSIS — Z23 Encounter for immunization: Secondary | ICD-10-CM | POA: Diagnosis not present

## 2016-03-14 DIAGNOSIS — Z992 Dependence on renal dialysis: Secondary | ICD-10-CM | POA: Diagnosis not present

## 2016-03-14 DIAGNOSIS — N2581 Secondary hyperparathyroidism of renal origin: Secondary | ICD-10-CM | POA: Diagnosis not present

## 2016-03-14 DIAGNOSIS — D509 Iron deficiency anemia, unspecified: Secondary | ICD-10-CM | POA: Diagnosis not present

## 2016-03-14 DIAGNOSIS — D631 Anemia in chronic kidney disease: Secondary | ICD-10-CM | POA: Diagnosis not present

## 2016-03-14 DIAGNOSIS — N186 End stage renal disease: Secondary | ICD-10-CM | POA: Diagnosis not present

## 2016-03-17 DIAGNOSIS — D509 Iron deficiency anemia, unspecified: Secondary | ICD-10-CM | POA: Diagnosis not present

## 2016-03-17 DIAGNOSIS — N2581 Secondary hyperparathyroidism of renal origin: Secondary | ICD-10-CM | POA: Diagnosis not present

## 2016-03-17 DIAGNOSIS — Z23 Encounter for immunization: Secondary | ICD-10-CM | POA: Diagnosis not present

## 2016-03-17 DIAGNOSIS — D631 Anemia in chronic kidney disease: Secondary | ICD-10-CM | POA: Diagnosis not present

## 2016-03-17 DIAGNOSIS — Z992 Dependence on renal dialysis: Secondary | ICD-10-CM | POA: Diagnosis not present

## 2016-03-17 DIAGNOSIS — N186 End stage renal disease: Secondary | ICD-10-CM | POA: Diagnosis not present

## 2016-03-19 DIAGNOSIS — D631 Anemia in chronic kidney disease: Secondary | ICD-10-CM | POA: Diagnosis not present

## 2016-03-19 DIAGNOSIS — N2581 Secondary hyperparathyroidism of renal origin: Secondary | ICD-10-CM | POA: Diagnosis not present

## 2016-03-19 DIAGNOSIS — N186 End stage renal disease: Secondary | ICD-10-CM | POA: Diagnosis not present

## 2016-03-19 DIAGNOSIS — Z992 Dependence on renal dialysis: Secondary | ICD-10-CM | POA: Diagnosis not present

## 2016-03-19 DIAGNOSIS — Z23 Encounter for immunization: Secondary | ICD-10-CM | POA: Diagnosis not present

## 2016-03-19 DIAGNOSIS — D509 Iron deficiency anemia, unspecified: Secondary | ICD-10-CM | POA: Diagnosis not present

## 2016-03-21 DIAGNOSIS — D631 Anemia in chronic kidney disease: Secondary | ICD-10-CM | POA: Diagnosis not present

## 2016-03-21 DIAGNOSIS — Z992 Dependence on renal dialysis: Secondary | ICD-10-CM | POA: Diagnosis not present

## 2016-03-21 DIAGNOSIS — Z23 Encounter for immunization: Secondary | ICD-10-CM | POA: Diagnosis not present

## 2016-03-21 DIAGNOSIS — D509 Iron deficiency anemia, unspecified: Secondary | ICD-10-CM | POA: Diagnosis not present

## 2016-03-21 DIAGNOSIS — N186 End stage renal disease: Secondary | ICD-10-CM | POA: Diagnosis not present

## 2016-03-21 DIAGNOSIS — N2581 Secondary hyperparathyroidism of renal origin: Secondary | ICD-10-CM | POA: Diagnosis not present

## 2016-03-24 DIAGNOSIS — Z23 Encounter for immunization: Secondary | ICD-10-CM | POA: Diagnosis not present

## 2016-03-24 DIAGNOSIS — D631 Anemia in chronic kidney disease: Secondary | ICD-10-CM | POA: Diagnosis not present

## 2016-03-24 DIAGNOSIS — Z992 Dependence on renal dialysis: Secondary | ICD-10-CM | POA: Diagnosis not present

## 2016-03-24 DIAGNOSIS — D509 Iron deficiency anemia, unspecified: Secondary | ICD-10-CM | POA: Diagnosis not present

## 2016-03-24 DIAGNOSIS — N2581 Secondary hyperparathyroidism of renal origin: Secondary | ICD-10-CM | POA: Diagnosis not present

## 2016-03-24 DIAGNOSIS — N186 End stage renal disease: Secondary | ICD-10-CM | POA: Diagnosis not present

## 2016-03-25 DIAGNOSIS — N186 End stage renal disease: Secondary | ICD-10-CM | POA: Diagnosis not present

## 2016-03-25 DIAGNOSIS — Z992 Dependence on renal dialysis: Secondary | ICD-10-CM | POA: Diagnosis not present

## 2016-03-26 DIAGNOSIS — N186 End stage renal disease: Secondary | ICD-10-CM | POA: Diagnosis not present

## 2016-03-26 DIAGNOSIS — N2581 Secondary hyperparathyroidism of renal origin: Secondary | ICD-10-CM | POA: Diagnosis not present

## 2016-03-26 DIAGNOSIS — D509 Iron deficiency anemia, unspecified: Secondary | ICD-10-CM | POA: Diagnosis not present

## 2016-03-26 DIAGNOSIS — Z992 Dependence on renal dialysis: Secondary | ICD-10-CM | POA: Diagnosis not present

## 2016-03-26 DIAGNOSIS — D631 Anemia in chronic kidney disease: Secondary | ICD-10-CM | POA: Diagnosis not present

## 2016-03-28 DIAGNOSIS — N186 End stage renal disease: Secondary | ICD-10-CM | POA: Diagnosis not present

## 2016-03-28 DIAGNOSIS — D509 Iron deficiency anemia, unspecified: Secondary | ICD-10-CM | POA: Diagnosis not present

## 2016-03-28 DIAGNOSIS — N2581 Secondary hyperparathyroidism of renal origin: Secondary | ICD-10-CM | POA: Diagnosis not present

## 2016-03-28 DIAGNOSIS — D631 Anemia in chronic kidney disease: Secondary | ICD-10-CM | POA: Diagnosis not present

## 2016-03-28 DIAGNOSIS — Z992 Dependence on renal dialysis: Secondary | ICD-10-CM | POA: Diagnosis not present

## 2016-03-31 DIAGNOSIS — D509 Iron deficiency anemia, unspecified: Secondary | ICD-10-CM | POA: Diagnosis not present

## 2016-03-31 DIAGNOSIS — Z992 Dependence on renal dialysis: Secondary | ICD-10-CM | POA: Diagnosis not present

## 2016-03-31 DIAGNOSIS — N2581 Secondary hyperparathyroidism of renal origin: Secondary | ICD-10-CM | POA: Diagnosis not present

## 2016-03-31 DIAGNOSIS — N186 End stage renal disease: Secondary | ICD-10-CM | POA: Diagnosis not present

## 2016-03-31 DIAGNOSIS — D631 Anemia in chronic kidney disease: Secondary | ICD-10-CM | POA: Diagnosis not present

## 2016-04-02 DIAGNOSIS — N2581 Secondary hyperparathyroidism of renal origin: Secondary | ICD-10-CM | POA: Diagnosis not present

## 2016-04-02 DIAGNOSIS — D631 Anemia in chronic kidney disease: Secondary | ICD-10-CM | POA: Diagnosis not present

## 2016-04-02 DIAGNOSIS — Z992 Dependence on renal dialysis: Secondary | ICD-10-CM | POA: Diagnosis not present

## 2016-04-02 DIAGNOSIS — D509 Iron deficiency anemia, unspecified: Secondary | ICD-10-CM | POA: Diagnosis not present

## 2016-04-02 DIAGNOSIS — N186 End stage renal disease: Secondary | ICD-10-CM | POA: Diagnosis not present

## 2016-04-04 DIAGNOSIS — D631 Anemia in chronic kidney disease: Secondary | ICD-10-CM | POA: Diagnosis not present

## 2016-04-04 DIAGNOSIS — N186 End stage renal disease: Secondary | ICD-10-CM | POA: Diagnosis not present

## 2016-04-04 DIAGNOSIS — Z992 Dependence on renal dialysis: Secondary | ICD-10-CM | POA: Diagnosis not present

## 2016-04-04 DIAGNOSIS — N2581 Secondary hyperparathyroidism of renal origin: Secondary | ICD-10-CM | POA: Diagnosis not present

## 2016-04-04 DIAGNOSIS — D509 Iron deficiency anemia, unspecified: Secondary | ICD-10-CM | POA: Diagnosis not present

## 2016-04-07 DIAGNOSIS — N2581 Secondary hyperparathyroidism of renal origin: Secondary | ICD-10-CM | POA: Diagnosis not present

## 2016-04-07 DIAGNOSIS — D509 Iron deficiency anemia, unspecified: Secondary | ICD-10-CM | POA: Diagnosis not present

## 2016-04-07 DIAGNOSIS — D631 Anemia in chronic kidney disease: Secondary | ICD-10-CM | POA: Diagnosis not present

## 2016-04-07 DIAGNOSIS — N186 End stage renal disease: Secondary | ICD-10-CM | POA: Diagnosis not present

## 2016-04-07 DIAGNOSIS — Z992 Dependence on renal dialysis: Secondary | ICD-10-CM | POA: Diagnosis not present

## 2016-04-09 DIAGNOSIS — N186 End stage renal disease: Secondary | ICD-10-CM | POA: Diagnosis not present

## 2016-04-09 DIAGNOSIS — D509 Iron deficiency anemia, unspecified: Secondary | ICD-10-CM | POA: Diagnosis not present

## 2016-04-09 DIAGNOSIS — D631 Anemia in chronic kidney disease: Secondary | ICD-10-CM | POA: Diagnosis not present

## 2016-04-09 DIAGNOSIS — N2581 Secondary hyperparathyroidism of renal origin: Secondary | ICD-10-CM | POA: Diagnosis not present

## 2016-04-09 DIAGNOSIS — Z992 Dependence on renal dialysis: Secondary | ICD-10-CM | POA: Diagnosis not present

## 2016-04-11 DIAGNOSIS — N186 End stage renal disease: Secondary | ICD-10-CM | POA: Diagnosis not present

## 2016-04-11 DIAGNOSIS — D631 Anemia in chronic kidney disease: Secondary | ICD-10-CM | POA: Diagnosis not present

## 2016-04-11 DIAGNOSIS — N2581 Secondary hyperparathyroidism of renal origin: Secondary | ICD-10-CM | POA: Diagnosis not present

## 2016-04-11 DIAGNOSIS — Z992 Dependence on renal dialysis: Secondary | ICD-10-CM | POA: Diagnosis not present

## 2016-04-11 DIAGNOSIS — D509 Iron deficiency anemia, unspecified: Secondary | ICD-10-CM | POA: Diagnosis not present

## 2016-04-14 DIAGNOSIS — Z992 Dependence on renal dialysis: Secondary | ICD-10-CM | POA: Diagnosis not present

## 2016-04-14 DIAGNOSIS — D509 Iron deficiency anemia, unspecified: Secondary | ICD-10-CM | POA: Diagnosis not present

## 2016-04-14 DIAGNOSIS — N186 End stage renal disease: Secondary | ICD-10-CM | POA: Diagnosis not present

## 2016-04-14 DIAGNOSIS — D631 Anemia in chronic kidney disease: Secondary | ICD-10-CM | POA: Diagnosis not present

## 2016-04-14 DIAGNOSIS — N2581 Secondary hyperparathyroidism of renal origin: Secondary | ICD-10-CM | POA: Diagnosis not present

## 2016-04-16 DIAGNOSIS — Z992 Dependence on renal dialysis: Secondary | ICD-10-CM | POA: Diagnosis not present

## 2016-04-16 DIAGNOSIS — N186 End stage renal disease: Secondary | ICD-10-CM | POA: Diagnosis not present

## 2016-04-16 DIAGNOSIS — D631 Anemia in chronic kidney disease: Secondary | ICD-10-CM | POA: Diagnosis not present

## 2016-04-16 DIAGNOSIS — N2581 Secondary hyperparathyroidism of renal origin: Secondary | ICD-10-CM | POA: Diagnosis not present

## 2016-04-16 DIAGNOSIS — D509 Iron deficiency anemia, unspecified: Secondary | ICD-10-CM | POA: Diagnosis not present

## 2016-04-18 DIAGNOSIS — D631 Anemia in chronic kidney disease: Secondary | ICD-10-CM | POA: Diagnosis not present

## 2016-04-18 DIAGNOSIS — D509 Iron deficiency anemia, unspecified: Secondary | ICD-10-CM | POA: Diagnosis not present

## 2016-04-18 DIAGNOSIS — N186 End stage renal disease: Secondary | ICD-10-CM | POA: Diagnosis not present

## 2016-04-18 DIAGNOSIS — N2581 Secondary hyperparathyroidism of renal origin: Secondary | ICD-10-CM | POA: Diagnosis not present

## 2016-04-18 DIAGNOSIS — Z992 Dependence on renal dialysis: Secondary | ICD-10-CM | POA: Diagnosis not present

## 2016-04-21 DIAGNOSIS — D509 Iron deficiency anemia, unspecified: Secondary | ICD-10-CM | POA: Diagnosis not present

## 2016-04-21 DIAGNOSIS — Z992 Dependence on renal dialysis: Secondary | ICD-10-CM | POA: Diagnosis not present

## 2016-04-21 DIAGNOSIS — N186 End stage renal disease: Secondary | ICD-10-CM | POA: Diagnosis not present

## 2016-04-21 DIAGNOSIS — D631 Anemia in chronic kidney disease: Secondary | ICD-10-CM | POA: Diagnosis not present

## 2016-04-21 DIAGNOSIS — N2581 Secondary hyperparathyroidism of renal origin: Secondary | ICD-10-CM | POA: Diagnosis not present

## 2016-04-22 DIAGNOSIS — N186 End stage renal disease: Secondary | ICD-10-CM | POA: Diagnosis not present

## 2016-04-22 DIAGNOSIS — Z992 Dependence on renal dialysis: Secondary | ICD-10-CM | POA: Diagnosis not present

## 2016-04-23 DIAGNOSIS — N2581 Secondary hyperparathyroidism of renal origin: Secondary | ICD-10-CM | POA: Diagnosis not present

## 2016-04-23 DIAGNOSIS — Z992 Dependence on renal dialysis: Secondary | ICD-10-CM | POA: Diagnosis not present

## 2016-04-23 DIAGNOSIS — N186 End stage renal disease: Secondary | ICD-10-CM | POA: Diagnosis not present

## 2016-04-23 DIAGNOSIS — D509 Iron deficiency anemia, unspecified: Secondary | ICD-10-CM | POA: Diagnosis not present

## 2016-04-23 DIAGNOSIS — D631 Anemia in chronic kidney disease: Secondary | ICD-10-CM | POA: Diagnosis not present

## 2016-04-25 DIAGNOSIS — N2581 Secondary hyperparathyroidism of renal origin: Secondary | ICD-10-CM | POA: Diagnosis not present

## 2016-04-25 DIAGNOSIS — Z992 Dependence on renal dialysis: Secondary | ICD-10-CM | POA: Diagnosis not present

## 2016-04-25 DIAGNOSIS — D509 Iron deficiency anemia, unspecified: Secondary | ICD-10-CM | POA: Diagnosis not present

## 2016-04-25 DIAGNOSIS — D631 Anemia in chronic kidney disease: Secondary | ICD-10-CM | POA: Diagnosis not present

## 2016-04-25 DIAGNOSIS — N186 End stage renal disease: Secondary | ICD-10-CM | POA: Diagnosis not present

## 2016-04-27 DIAGNOSIS — E1121 Type 2 diabetes mellitus with diabetic nephropathy: Secondary | ICD-10-CM | POA: Diagnosis not present

## 2016-04-27 DIAGNOSIS — I2510 Atherosclerotic heart disease of native coronary artery without angina pectoris: Secondary | ICD-10-CM | POA: Diagnosis not present

## 2016-04-27 DIAGNOSIS — I10 Essential (primary) hypertension: Secondary | ICD-10-CM | POA: Diagnosis not present

## 2016-04-27 DIAGNOSIS — F3289 Other specified depressive episodes: Secondary | ICD-10-CM | POA: Diagnosis not present

## 2016-04-28 DIAGNOSIS — D509 Iron deficiency anemia, unspecified: Secondary | ICD-10-CM | POA: Diagnosis not present

## 2016-04-28 DIAGNOSIS — Z992 Dependence on renal dialysis: Secondary | ICD-10-CM | POA: Diagnosis not present

## 2016-04-28 DIAGNOSIS — N186 End stage renal disease: Secondary | ICD-10-CM | POA: Diagnosis not present

## 2016-04-28 DIAGNOSIS — D631 Anemia in chronic kidney disease: Secondary | ICD-10-CM | POA: Diagnosis not present

## 2016-04-28 DIAGNOSIS — N2581 Secondary hyperparathyroidism of renal origin: Secondary | ICD-10-CM | POA: Diagnosis not present

## 2016-04-30 DIAGNOSIS — D631 Anemia in chronic kidney disease: Secondary | ICD-10-CM | POA: Diagnosis not present

## 2016-04-30 DIAGNOSIS — N2581 Secondary hyperparathyroidism of renal origin: Secondary | ICD-10-CM | POA: Diagnosis not present

## 2016-04-30 DIAGNOSIS — D509 Iron deficiency anemia, unspecified: Secondary | ICD-10-CM | POA: Diagnosis not present

## 2016-04-30 DIAGNOSIS — Z992 Dependence on renal dialysis: Secondary | ICD-10-CM | POA: Diagnosis not present

## 2016-04-30 DIAGNOSIS — N186 End stage renal disease: Secondary | ICD-10-CM | POA: Diagnosis not present

## 2016-05-02 DIAGNOSIS — N186 End stage renal disease: Secondary | ICD-10-CM | POA: Diagnosis not present

## 2016-05-02 DIAGNOSIS — D509 Iron deficiency anemia, unspecified: Secondary | ICD-10-CM | POA: Diagnosis not present

## 2016-05-02 DIAGNOSIS — N2581 Secondary hyperparathyroidism of renal origin: Secondary | ICD-10-CM | POA: Diagnosis not present

## 2016-05-02 DIAGNOSIS — D631 Anemia in chronic kidney disease: Secondary | ICD-10-CM | POA: Diagnosis not present

## 2016-05-02 DIAGNOSIS — Z992 Dependence on renal dialysis: Secondary | ICD-10-CM | POA: Diagnosis not present

## 2016-05-05 DIAGNOSIS — D631 Anemia in chronic kidney disease: Secondary | ICD-10-CM | POA: Diagnosis not present

## 2016-05-05 DIAGNOSIS — N2581 Secondary hyperparathyroidism of renal origin: Secondary | ICD-10-CM | POA: Diagnosis not present

## 2016-05-05 DIAGNOSIS — N186 End stage renal disease: Secondary | ICD-10-CM | POA: Diagnosis not present

## 2016-05-05 DIAGNOSIS — Z992 Dependence on renal dialysis: Secondary | ICD-10-CM | POA: Diagnosis not present

## 2016-05-05 DIAGNOSIS — D509 Iron deficiency anemia, unspecified: Secondary | ICD-10-CM | POA: Diagnosis not present

## 2016-05-07 DIAGNOSIS — D509 Iron deficiency anemia, unspecified: Secondary | ICD-10-CM | POA: Diagnosis not present

## 2016-05-07 DIAGNOSIS — N186 End stage renal disease: Secondary | ICD-10-CM | POA: Diagnosis not present

## 2016-05-07 DIAGNOSIS — Z992 Dependence on renal dialysis: Secondary | ICD-10-CM | POA: Diagnosis not present

## 2016-05-07 DIAGNOSIS — N2581 Secondary hyperparathyroidism of renal origin: Secondary | ICD-10-CM | POA: Diagnosis not present

## 2016-05-07 DIAGNOSIS — D631 Anemia in chronic kidney disease: Secondary | ICD-10-CM | POA: Diagnosis not present

## 2016-05-09 DIAGNOSIS — N186 End stage renal disease: Secondary | ICD-10-CM | POA: Diagnosis not present

## 2016-05-09 DIAGNOSIS — D631 Anemia in chronic kidney disease: Secondary | ICD-10-CM | POA: Diagnosis not present

## 2016-05-09 DIAGNOSIS — D509 Iron deficiency anemia, unspecified: Secondary | ICD-10-CM | POA: Diagnosis not present

## 2016-05-09 DIAGNOSIS — N2581 Secondary hyperparathyroidism of renal origin: Secondary | ICD-10-CM | POA: Diagnosis not present

## 2016-05-09 DIAGNOSIS — Z992 Dependence on renal dialysis: Secondary | ICD-10-CM | POA: Diagnosis not present

## 2016-05-12 DIAGNOSIS — N2581 Secondary hyperparathyroidism of renal origin: Secondary | ICD-10-CM | POA: Diagnosis not present

## 2016-05-12 DIAGNOSIS — D509 Iron deficiency anemia, unspecified: Secondary | ICD-10-CM | POA: Diagnosis not present

## 2016-05-12 DIAGNOSIS — D631 Anemia in chronic kidney disease: Secondary | ICD-10-CM | POA: Diagnosis not present

## 2016-05-12 DIAGNOSIS — Z992 Dependence on renal dialysis: Secondary | ICD-10-CM | POA: Diagnosis not present

## 2016-05-12 DIAGNOSIS — N186 End stage renal disease: Secondary | ICD-10-CM | POA: Diagnosis not present

## 2016-05-14 DIAGNOSIS — N186 End stage renal disease: Secondary | ICD-10-CM | POA: Diagnosis not present

## 2016-05-14 DIAGNOSIS — D631 Anemia in chronic kidney disease: Secondary | ICD-10-CM | POA: Diagnosis not present

## 2016-05-14 DIAGNOSIS — Z992 Dependence on renal dialysis: Secondary | ICD-10-CM | POA: Diagnosis not present

## 2016-05-14 DIAGNOSIS — N2581 Secondary hyperparathyroidism of renal origin: Secondary | ICD-10-CM | POA: Diagnosis not present

## 2016-05-14 DIAGNOSIS — D509 Iron deficiency anemia, unspecified: Secondary | ICD-10-CM | POA: Diagnosis not present

## 2016-05-16 DIAGNOSIS — Z992 Dependence on renal dialysis: Secondary | ICD-10-CM | POA: Diagnosis not present

## 2016-05-16 DIAGNOSIS — N2581 Secondary hyperparathyroidism of renal origin: Secondary | ICD-10-CM | POA: Diagnosis not present

## 2016-05-16 DIAGNOSIS — N186 End stage renal disease: Secondary | ICD-10-CM | POA: Diagnosis not present

## 2016-05-16 DIAGNOSIS — D631 Anemia in chronic kidney disease: Secondary | ICD-10-CM | POA: Diagnosis not present

## 2016-05-16 DIAGNOSIS — D509 Iron deficiency anemia, unspecified: Secondary | ICD-10-CM | POA: Diagnosis not present

## 2016-05-19 DIAGNOSIS — Z992 Dependence on renal dialysis: Secondary | ICD-10-CM | POA: Diagnosis not present

## 2016-05-19 DIAGNOSIS — E119 Type 2 diabetes mellitus without complications: Secondary | ICD-10-CM | POA: Diagnosis not present

## 2016-05-19 DIAGNOSIS — N186 End stage renal disease: Secondary | ICD-10-CM | POA: Diagnosis not present

## 2016-05-19 DIAGNOSIS — D509 Iron deficiency anemia, unspecified: Secondary | ICD-10-CM | POA: Diagnosis not present

## 2016-05-19 DIAGNOSIS — N2581 Secondary hyperparathyroidism of renal origin: Secondary | ICD-10-CM | POA: Diagnosis not present

## 2016-05-19 DIAGNOSIS — D631 Anemia in chronic kidney disease: Secondary | ICD-10-CM | POA: Diagnosis not present

## 2016-05-21 DIAGNOSIS — N186 End stage renal disease: Secondary | ICD-10-CM | POA: Diagnosis not present

## 2016-05-21 DIAGNOSIS — D631 Anemia in chronic kidney disease: Secondary | ICD-10-CM | POA: Diagnosis not present

## 2016-05-21 DIAGNOSIS — D509 Iron deficiency anemia, unspecified: Secondary | ICD-10-CM | POA: Diagnosis not present

## 2016-05-21 DIAGNOSIS — N2581 Secondary hyperparathyroidism of renal origin: Secondary | ICD-10-CM | POA: Diagnosis not present

## 2016-05-21 DIAGNOSIS — Z992 Dependence on renal dialysis: Secondary | ICD-10-CM | POA: Diagnosis not present

## 2016-05-23 DIAGNOSIS — D509 Iron deficiency anemia, unspecified: Secondary | ICD-10-CM | POA: Diagnosis not present

## 2016-05-23 DIAGNOSIS — D631 Anemia in chronic kidney disease: Secondary | ICD-10-CM | POA: Diagnosis not present

## 2016-05-23 DIAGNOSIS — Z992 Dependence on renal dialysis: Secondary | ICD-10-CM | POA: Diagnosis not present

## 2016-05-23 DIAGNOSIS — N2581 Secondary hyperparathyroidism of renal origin: Secondary | ICD-10-CM | POA: Diagnosis not present

## 2016-05-23 DIAGNOSIS — N186 End stage renal disease: Secondary | ICD-10-CM | POA: Diagnosis not present

## 2016-05-26 DIAGNOSIS — D631 Anemia in chronic kidney disease: Secondary | ICD-10-CM | POA: Diagnosis not present

## 2016-05-26 DIAGNOSIS — N186 End stage renal disease: Secondary | ICD-10-CM | POA: Diagnosis not present

## 2016-05-26 DIAGNOSIS — Z992 Dependence on renal dialysis: Secondary | ICD-10-CM | POA: Diagnosis not present

## 2016-05-26 DIAGNOSIS — N2581 Secondary hyperparathyroidism of renal origin: Secondary | ICD-10-CM | POA: Diagnosis not present

## 2016-05-26 DIAGNOSIS — D509 Iron deficiency anemia, unspecified: Secondary | ICD-10-CM | POA: Diagnosis not present

## 2016-05-26 DIAGNOSIS — Z23 Encounter for immunization: Secondary | ICD-10-CM | POA: Diagnosis not present

## 2016-05-28 DIAGNOSIS — Z23 Encounter for immunization: Secondary | ICD-10-CM | POA: Diagnosis not present

## 2016-05-28 DIAGNOSIS — N2581 Secondary hyperparathyroidism of renal origin: Secondary | ICD-10-CM | POA: Diagnosis not present

## 2016-05-28 DIAGNOSIS — D509 Iron deficiency anemia, unspecified: Secondary | ICD-10-CM | POA: Diagnosis not present

## 2016-05-28 DIAGNOSIS — Z992 Dependence on renal dialysis: Secondary | ICD-10-CM | POA: Diagnosis not present

## 2016-05-28 DIAGNOSIS — N186 End stage renal disease: Secondary | ICD-10-CM | POA: Diagnosis not present

## 2016-05-28 DIAGNOSIS — D631 Anemia in chronic kidney disease: Secondary | ICD-10-CM | POA: Diagnosis not present

## 2016-05-30 DIAGNOSIS — D631 Anemia in chronic kidney disease: Secondary | ICD-10-CM | POA: Diagnosis not present

## 2016-05-30 DIAGNOSIS — Z992 Dependence on renal dialysis: Secondary | ICD-10-CM | POA: Diagnosis not present

## 2016-05-30 DIAGNOSIS — D509 Iron deficiency anemia, unspecified: Secondary | ICD-10-CM | POA: Diagnosis not present

## 2016-05-30 DIAGNOSIS — N2581 Secondary hyperparathyroidism of renal origin: Secondary | ICD-10-CM | POA: Diagnosis not present

## 2016-05-30 DIAGNOSIS — Z23 Encounter for immunization: Secondary | ICD-10-CM | POA: Diagnosis not present

## 2016-05-30 DIAGNOSIS — N186 End stage renal disease: Secondary | ICD-10-CM | POA: Diagnosis not present

## 2016-06-02 DIAGNOSIS — N2581 Secondary hyperparathyroidism of renal origin: Secondary | ICD-10-CM | POA: Diagnosis not present

## 2016-06-02 DIAGNOSIS — Z992 Dependence on renal dialysis: Secondary | ICD-10-CM | POA: Diagnosis not present

## 2016-06-02 DIAGNOSIS — Z23 Encounter for immunization: Secondary | ICD-10-CM | POA: Diagnosis not present

## 2016-06-02 DIAGNOSIS — D631 Anemia in chronic kidney disease: Secondary | ICD-10-CM | POA: Diagnosis not present

## 2016-06-02 DIAGNOSIS — N186 End stage renal disease: Secondary | ICD-10-CM | POA: Diagnosis not present

## 2016-06-02 DIAGNOSIS — D509 Iron deficiency anemia, unspecified: Secondary | ICD-10-CM | POA: Diagnosis not present

## 2016-06-04 DIAGNOSIS — Z992 Dependence on renal dialysis: Secondary | ICD-10-CM | POA: Diagnosis not present

## 2016-06-04 DIAGNOSIS — N2581 Secondary hyperparathyroidism of renal origin: Secondary | ICD-10-CM | POA: Diagnosis not present

## 2016-06-04 DIAGNOSIS — Z23 Encounter for immunization: Secondary | ICD-10-CM | POA: Diagnosis not present

## 2016-06-04 DIAGNOSIS — D631 Anemia in chronic kidney disease: Secondary | ICD-10-CM | POA: Diagnosis not present

## 2016-06-04 DIAGNOSIS — N186 End stage renal disease: Secondary | ICD-10-CM | POA: Diagnosis not present

## 2016-06-04 DIAGNOSIS — D509 Iron deficiency anemia, unspecified: Secondary | ICD-10-CM | POA: Diagnosis not present

## 2016-06-06 DIAGNOSIS — Z992 Dependence on renal dialysis: Secondary | ICD-10-CM | POA: Diagnosis not present

## 2016-06-06 DIAGNOSIS — N2581 Secondary hyperparathyroidism of renal origin: Secondary | ICD-10-CM | POA: Diagnosis not present

## 2016-06-06 DIAGNOSIS — Z23 Encounter for immunization: Secondary | ICD-10-CM | POA: Diagnosis not present

## 2016-06-06 DIAGNOSIS — N186 End stage renal disease: Secondary | ICD-10-CM | POA: Diagnosis not present

## 2016-06-06 DIAGNOSIS — D509 Iron deficiency anemia, unspecified: Secondary | ICD-10-CM | POA: Diagnosis not present

## 2016-06-06 DIAGNOSIS — D631 Anemia in chronic kidney disease: Secondary | ICD-10-CM | POA: Diagnosis not present

## 2016-06-09 DIAGNOSIS — D509 Iron deficiency anemia, unspecified: Secondary | ICD-10-CM | POA: Diagnosis not present

## 2016-06-09 DIAGNOSIS — Z992 Dependence on renal dialysis: Secondary | ICD-10-CM | POA: Diagnosis not present

## 2016-06-09 DIAGNOSIS — Z23 Encounter for immunization: Secondary | ICD-10-CM | POA: Diagnosis not present

## 2016-06-09 DIAGNOSIS — N2581 Secondary hyperparathyroidism of renal origin: Secondary | ICD-10-CM | POA: Diagnosis not present

## 2016-06-09 DIAGNOSIS — D631 Anemia in chronic kidney disease: Secondary | ICD-10-CM | POA: Diagnosis not present

## 2016-06-09 DIAGNOSIS — N186 End stage renal disease: Secondary | ICD-10-CM | POA: Diagnosis not present

## 2016-06-11 DIAGNOSIS — D509 Iron deficiency anemia, unspecified: Secondary | ICD-10-CM | POA: Diagnosis not present

## 2016-06-11 DIAGNOSIS — Z992 Dependence on renal dialysis: Secondary | ICD-10-CM | POA: Diagnosis not present

## 2016-06-11 DIAGNOSIS — D631 Anemia in chronic kidney disease: Secondary | ICD-10-CM | POA: Diagnosis not present

## 2016-06-11 DIAGNOSIS — N186 End stage renal disease: Secondary | ICD-10-CM | POA: Diagnosis not present

## 2016-06-11 DIAGNOSIS — N2581 Secondary hyperparathyroidism of renal origin: Secondary | ICD-10-CM | POA: Diagnosis not present

## 2016-06-11 DIAGNOSIS — Z23 Encounter for immunization: Secondary | ICD-10-CM | POA: Diagnosis not present

## 2016-06-13 DIAGNOSIS — Z23 Encounter for immunization: Secondary | ICD-10-CM | POA: Diagnosis not present

## 2016-06-13 DIAGNOSIS — D509 Iron deficiency anemia, unspecified: Secondary | ICD-10-CM | POA: Diagnosis not present

## 2016-06-13 DIAGNOSIS — D631 Anemia in chronic kidney disease: Secondary | ICD-10-CM | POA: Diagnosis not present

## 2016-06-13 DIAGNOSIS — N186 End stage renal disease: Secondary | ICD-10-CM | POA: Diagnosis not present

## 2016-06-13 DIAGNOSIS — N2581 Secondary hyperparathyroidism of renal origin: Secondary | ICD-10-CM | POA: Diagnosis not present

## 2016-06-13 DIAGNOSIS — Z992 Dependence on renal dialysis: Secondary | ICD-10-CM | POA: Diagnosis not present

## 2016-06-16 DIAGNOSIS — D509 Iron deficiency anemia, unspecified: Secondary | ICD-10-CM | POA: Diagnosis not present

## 2016-06-16 DIAGNOSIS — D631 Anemia in chronic kidney disease: Secondary | ICD-10-CM | POA: Diagnosis not present

## 2016-06-16 DIAGNOSIS — Z23 Encounter for immunization: Secondary | ICD-10-CM | POA: Diagnosis not present

## 2016-06-16 DIAGNOSIS — N186 End stage renal disease: Secondary | ICD-10-CM | POA: Diagnosis not present

## 2016-06-16 DIAGNOSIS — N2581 Secondary hyperparathyroidism of renal origin: Secondary | ICD-10-CM | POA: Diagnosis not present

## 2016-06-16 DIAGNOSIS — Z992 Dependence on renal dialysis: Secondary | ICD-10-CM | POA: Diagnosis not present

## 2016-06-18 DIAGNOSIS — D509 Iron deficiency anemia, unspecified: Secondary | ICD-10-CM | POA: Diagnosis not present

## 2016-06-18 DIAGNOSIS — Z992 Dependence on renal dialysis: Secondary | ICD-10-CM | POA: Diagnosis not present

## 2016-06-18 DIAGNOSIS — N2581 Secondary hyperparathyroidism of renal origin: Secondary | ICD-10-CM | POA: Diagnosis not present

## 2016-06-18 DIAGNOSIS — N186 End stage renal disease: Secondary | ICD-10-CM | POA: Diagnosis not present

## 2016-06-18 DIAGNOSIS — Z23 Encounter for immunization: Secondary | ICD-10-CM | POA: Diagnosis not present

## 2016-06-18 DIAGNOSIS — D631 Anemia in chronic kidney disease: Secondary | ICD-10-CM | POA: Diagnosis not present

## 2016-06-20 DIAGNOSIS — Z992 Dependence on renal dialysis: Secondary | ICD-10-CM | POA: Diagnosis not present

## 2016-06-20 DIAGNOSIS — Z23 Encounter for immunization: Secondary | ICD-10-CM | POA: Diagnosis not present

## 2016-06-20 DIAGNOSIS — D631 Anemia in chronic kidney disease: Secondary | ICD-10-CM | POA: Diagnosis not present

## 2016-06-20 DIAGNOSIS — N2581 Secondary hyperparathyroidism of renal origin: Secondary | ICD-10-CM | POA: Diagnosis not present

## 2016-06-20 DIAGNOSIS — D509 Iron deficiency anemia, unspecified: Secondary | ICD-10-CM | POA: Diagnosis not present

## 2016-06-20 DIAGNOSIS — N186 End stage renal disease: Secondary | ICD-10-CM | POA: Diagnosis not present

## 2016-06-22 DIAGNOSIS — Z992 Dependence on renal dialysis: Secondary | ICD-10-CM | POA: Diagnosis not present

## 2016-06-22 DIAGNOSIS — N186 End stage renal disease: Secondary | ICD-10-CM | POA: Diagnosis not present

## 2016-06-23 DIAGNOSIS — D509 Iron deficiency anemia, unspecified: Secondary | ICD-10-CM | POA: Diagnosis not present

## 2016-06-23 DIAGNOSIS — N2581 Secondary hyperparathyroidism of renal origin: Secondary | ICD-10-CM | POA: Diagnosis not present

## 2016-06-23 DIAGNOSIS — N186 End stage renal disease: Secondary | ICD-10-CM | POA: Diagnosis not present

## 2016-06-23 DIAGNOSIS — D631 Anemia in chronic kidney disease: Secondary | ICD-10-CM | POA: Diagnosis not present

## 2016-06-23 DIAGNOSIS — Z992 Dependence on renal dialysis: Secondary | ICD-10-CM | POA: Diagnosis not present

## 2016-06-23 DIAGNOSIS — Z23 Encounter for immunization: Secondary | ICD-10-CM | POA: Diagnosis not present

## 2016-06-25 DIAGNOSIS — D509 Iron deficiency anemia, unspecified: Secondary | ICD-10-CM | POA: Diagnosis not present

## 2016-06-25 DIAGNOSIS — Z992 Dependence on renal dialysis: Secondary | ICD-10-CM | POA: Diagnosis not present

## 2016-06-25 DIAGNOSIS — D631 Anemia in chronic kidney disease: Secondary | ICD-10-CM | POA: Diagnosis not present

## 2016-06-25 DIAGNOSIS — Z23 Encounter for immunization: Secondary | ICD-10-CM | POA: Diagnosis not present

## 2016-06-25 DIAGNOSIS — N2581 Secondary hyperparathyroidism of renal origin: Secondary | ICD-10-CM | POA: Diagnosis not present

## 2016-06-25 DIAGNOSIS — N186 End stage renal disease: Secondary | ICD-10-CM | POA: Diagnosis not present

## 2016-06-27 DIAGNOSIS — N2581 Secondary hyperparathyroidism of renal origin: Secondary | ICD-10-CM | POA: Diagnosis not present

## 2016-06-27 DIAGNOSIS — Z992 Dependence on renal dialysis: Secondary | ICD-10-CM | POA: Diagnosis not present

## 2016-06-27 DIAGNOSIS — D631 Anemia in chronic kidney disease: Secondary | ICD-10-CM | POA: Diagnosis not present

## 2016-06-27 DIAGNOSIS — N186 End stage renal disease: Secondary | ICD-10-CM | POA: Diagnosis not present

## 2016-06-27 DIAGNOSIS — Z23 Encounter for immunization: Secondary | ICD-10-CM | POA: Diagnosis not present

## 2016-06-27 DIAGNOSIS — D509 Iron deficiency anemia, unspecified: Secondary | ICD-10-CM | POA: Diagnosis not present

## 2016-06-30 DIAGNOSIS — Z992 Dependence on renal dialysis: Secondary | ICD-10-CM | POA: Diagnosis not present

## 2016-06-30 DIAGNOSIS — D509 Iron deficiency anemia, unspecified: Secondary | ICD-10-CM | POA: Diagnosis not present

## 2016-06-30 DIAGNOSIS — N2581 Secondary hyperparathyroidism of renal origin: Secondary | ICD-10-CM | POA: Diagnosis not present

## 2016-06-30 DIAGNOSIS — Z23 Encounter for immunization: Secondary | ICD-10-CM | POA: Diagnosis not present

## 2016-06-30 DIAGNOSIS — N186 End stage renal disease: Secondary | ICD-10-CM | POA: Diagnosis not present

## 2016-06-30 DIAGNOSIS — D631 Anemia in chronic kidney disease: Secondary | ICD-10-CM | POA: Diagnosis not present

## 2016-07-02 DIAGNOSIS — N186 End stage renal disease: Secondary | ICD-10-CM | POA: Diagnosis not present

## 2016-07-02 DIAGNOSIS — Z992 Dependence on renal dialysis: Secondary | ICD-10-CM | POA: Diagnosis not present

## 2016-07-02 DIAGNOSIS — D509 Iron deficiency anemia, unspecified: Secondary | ICD-10-CM | POA: Diagnosis not present

## 2016-07-02 DIAGNOSIS — D631 Anemia in chronic kidney disease: Secondary | ICD-10-CM | POA: Diagnosis not present

## 2016-07-02 DIAGNOSIS — Z23 Encounter for immunization: Secondary | ICD-10-CM | POA: Diagnosis not present

## 2016-07-02 DIAGNOSIS — N2581 Secondary hyperparathyroidism of renal origin: Secondary | ICD-10-CM | POA: Diagnosis not present

## 2016-07-04 DIAGNOSIS — N2581 Secondary hyperparathyroidism of renal origin: Secondary | ICD-10-CM | POA: Diagnosis not present

## 2016-07-04 DIAGNOSIS — D509 Iron deficiency anemia, unspecified: Secondary | ICD-10-CM | POA: Diagnosis not present

## 2016-07-04 DIAGNOSIS — N186 End stage renal disease: Secondary | ICD-10-CM | POA: Diagnosis not present

## 2016-07-04 DIAGNOSIS — Z992 Dependence on renal dialysis: Secondary | ICD-10-CM | POA: Diagnosis not present

## 2016-07-04 DIAGNOSIS — Z23 Encounter for immunization: Secondary | ICD-10-CM | POA: Diagnosis not present

## 2016-07-04 DIAGNOSIS — D631 Anemia in chronic kidney disease: Secondary | ICD-10-CM | POA: Diagnosis not present

## 2016-07-07 DIAGNOSIS — D631 Anemia in chronic kidney disease: Secondary | ICD-10-CM | POA: Diagnosis not present

## 2016-07-07 DIAGNOSIS — D509 Iron deficiency anemia, unspecified: Secondary | ICD-10-CM | POA: Diagnosis not present

## 2016-07-07 DIAGNOSIS — N186 End stage renal disease: Secondary | ICD-10-CM | POA: Diagnosis not present

## 2016-07-07 DIAGNOSIS — Z23 Encounter for immunization: Secondary | ICD-10-CM | POA: Diagnosis not present

## 2016-07-07 DIAGNOSIS — Z992 Dependence on renal dialysis: Secondary | ICD-10-CM | POA: Diagnosis not present

## 2016-07-07 DIAGNOSIS — N2581 Secondary hyperparathyroidism of renal origin: Secondary | ICD-10-CM | POA: Diagnosis not present

## 2016-07-09 DIAGNOSIS — D631 Anemia in chronic kidney disease: Secondary | ICD-10-CM | POA: Diagnosis not present

## 2016-07-09 DIAGNOSIS — Z992 Dependence on renal dialysis: Secondary | ICD-10-CM | POA: Diagnosis not present

## 2016-07-09 DIAGNOSIS — Z23 Encounter for immunization: Secondary | ICD-10-CM | POA: Diagnosis not present

## 2016-07-09 DIAGNOSIS — N2581 Secondary hyperparathyroidism of renal origin: Secondary | ICD-10-CM | POA: Diagnosis not present

## 2016-07-09 DIAGNOSIS — D509 Iron deficiency anemia, unspecified: Secondary | ICD-10-CM | POA: Diagnosis not present

## 2016-07-09 DIAGNOSIS — N186 End stage renal disease: Secondary | ICD-10-CM | POA: Diagnosis not present

## 2016-07-11 DIAGNOSIS — N2581 Secondary hyperparathyroidism of renal origin: Secondary | ICD-10-CM | POA: Diagnosis not present

## 2016-07-11 DIAGNOSIS — D631 Anemia in chronic kidney disease: Secondary | ICD-10-CM | POA: Diagnosis not present

## 2016-07-11 DIAGNOSIS — N186 End stage renal disease: Secondary | ICD-10-CM | POA: Diagnosis not present

## 2016-07-11 DIAGNOSIS — D509 Iron deficiency anemia, unspecified: Secondary | ICD-10-CM | POA: Diagnosis not present

## 2016-07-11 DIAGNOSIS — Z992 Dependence on renal dialysis: Secondary | ICD-10-CM | POA: Diagnosis not present

## 2016-07-11 DIAGNOSIS — Z23 Encounter for immunization: Secondary | ICD-10-CM | POA: Diagnosis not present

## 2016-07-14 DIAGNOSIS — N186 End stage renal disease: Secondary | ICD-10-CM | POA: Diagnosis not present

## 2016-07-14 DIAGNOSIS — D509 Iron deficiency anemia, unspecified: Secondary | ICD-10-CM | POA: Diagnosis not present

## 2016-07-14 DIAGNOSIS — D631 Anemia in chronic kidney disease: Secondary | ICD-10-CM | POA: Diagnosis not present

## 2016-07-14 DIAGNOSIS — N2581 Secondary hyperparathyroidism of renal origin: Secondary | ICD-10-CM | POA: Diagnosis not present

## 2016-07-14 DIAGNOSIS — Z23 Encounter for immunization: Secondary | ICD-10-CM | POA: Diagnosis not present

## 2016-07-14 DIAGNOSIS — Z992 Dependence on renal dialysis: Secondary | ICD-10-CM | POA: Diagnosis not present

## 2016-07-16 DIAGNOSIS — Z992 Dependence on renal dialysis: Secondary | ICD-10-CM | POA: Diagnosis not present

## 2016-07-16 DIAGNOSIS — Z23 Encounter for immunization: Secondary | ICD-10-CM | POA: Diagnosis not present

## 2016-07-16 DIAGNOSIS — N186 End stage renal disease: Secondary | ICD-10-CM | POA: Diagnosis not present

## 2016-07-16 DIAGNOSIS — D509 Iron deficiency anemia, unspecified: Secondary | ICD-10-CM | POA: Diagnosis not present

## 2016-07-16 DIAGNOSIS — N2581 Secondary hyperparathyroidism of renal origin: Secondary | ICD-10-CM | POA: Diagnosis not present

## 2016-07-16 DIAGNOSIS — D631 Anemia in chronic kidney disease: Secondary | ICD-10-CM | POA: Diagnosis not present

## 2016-07-18 DIAGNOSIS — N186 End stage renal disease: Secondary | ICD-10-CM | POA: Diagnosis not present

## 2016-07-18 DIAGNOSIS — Z992 Dependence on renal dialysis: Secondary | ICD-10-CM | POA: Diagnosis not present

## 2016-07-18 DIAGNOSIS — D509 Iron deficiency anemia, unspecified: Secondary | ICD-10-CM | POA: Diagnosis not present

## 2016-07-18 DIAGNOSIS — Z23 Encounter for immunization: Secondary | ICD-10-CM | POA: Diagnosis not present

## 2016-07-18 DIAGNOSIS — N2581 Secondary hyperparathyroidism of renal origin: Secondary | ICD-10-CM | POA: Diagnosis not present

## 2016-07-18 DIAGNOSIS — D631 Anemia in chronic kidney disease: Secondary | ICD-10-CM | POA: Diagnosis not present

## 2016-07-21 DIAGNOSIS — Z992 Dependence on renal dialysis: Secondary | ICD-10-CM | POA: Diagnosis not present

## 2016-07-21 DIAGNOSIS — Z23 Encounter for immunization: Secondary | ICD-10-CM | POA: Diagnosis not present

## 2016-07-21 DIAGNOSIS — D509 Iron deficiency anemia, unspecified: Secondary | ICD-10-CM | POA: Diagnosis not present

## 2016-07-21 DIAGNOSIS — N2581 Secondary hyperparathyroidism of renal origin: Secondary | ICD-10-CM | POA: Diagnosis not present

## 2016-07-21 DIAGNOSIS — N186 End stage renal disease: Secondary | ICD-10-CM | POA: Diagnosis not present

## 2016-07-21 DIAGNOSIS — D631 Anemia in chronic kidney disease: Secondary | ICD-10-CM | POA: Diagnosis not present

## 2016-07-23 DIAGNOSIS — N186 End stage renal disease: Secondary | ICD-10-CM | POA: Diagnosis not present

## 2016-07-23 DIAGNOSIS — N2581 Secondary hyperparathyroidism of renal origin: Secondary | ICD-10-CM | POA: Diagnosis not present

## 2016-07-23 DIAGNOSIS — D509 Iron deficiency anemia, unspecified: Secondary | ICD-10-CM | POA: Diagnosis not present

## 2016-07-23 DIAGNOSIS — D631 Anemia in chronic kidney disease: Secondary | ICD-10-CM | POA: Diagnosis not present

## 2016-07-23 DIAGNOSIS — Z992 Dependence on renal dialysis: Secondary | ICD-10-CM | POA: Diagnosis not present

## 2016-07-23 DIAGNOSIS — Z23 Encounter for immunization: Secondary | ICD-10-CM | POA: Diagnosis not present

## 2016-07-25 DIAGNOSIS — D631 Anemia in chronic kidney disease: Secondary | ICD-10-CM | POA: Diagnosis not present

## 2016-07-25 DIAGNOSIS — N2581 Secondary hyperparathyroidism of renal origin: Secondary | ICD-10-CM | POA: Diagnosis not present

## 2016-07-25 DIAGNOSIS — D509 Iron deficiency anemia, unspecified: Secondary | ICD-10-CM | POA: Diagnosis not present

## 2016-07-25 DIAGNOSIS — N186 End stage renal disease: Secondary | ICD-10-CM | POA: Diagnosis not present

## 2016-07-25 DIAGNOSIS — Z23 Encounter for immunization: Secondary | ICD-10-CM | POA: Diagnosis not present

## 2016-07-25 DIAGNOSIS — Z992 Dependence on renal dialysis: Secondary | ICD-10-CM | POA: Diagnosis not present

## 2016-07-28 DIAGNOSIS — D509 Iron deficiency anemia, unspecified: Secondary | ICD-10-CM | POA: Diagnosis not present

## 2016-07-28 DIAGNOSIS — Z23 Encounter for immunization: Secondary | ICD-10-CM | POA: Diagnosis not present

## 2016-07-28 DIAGNOSIS — Z992 Dependence on renal dialysis: Secondary | ICD-10-CM | POA: Diagnosis not present

## 2016-07-28 DIAGNOSIS — D631 Anemia in chronic kidney disease: Secondary | ICD-10-CM | POA: Diagnosis not present

## 2016-07-28 DIAGNOSIS — N186 End stage renal disease: Secondary | ICD-10-CM | POA: Diagnosis not present

## 2016-07-28 DIAGNOSIS — N2581 Secondary hyperparathyroidism of renal origin: Secondary | ICD-10-CM | POA: Diagnosis not present

## 2016-07-30 DIAGNOSIS — Z992 Dependence on renal dialysis: Secondary | ICD-10-CM | POA: Diagnosis not present

## 2016-07-30 DIAGNOSIS — D509 Iron deficiency anemia, unspecified: Secondary | ICD-10-CM | POA: Diagnosis not present

## 2016-07-30 DIAGNOSIS — Z23 Encounter for immunization: Secondary | ICD-10-CM | POA: Diagnosis not present

## 2016-07-30 DIAGNOSIS — N2581 Secondary hyperparathyroidism of renal origin: Secondary | ICD-10-CM | POA: Diagnosis not present

## 2016-07-30 DIAGNOSIS — N186 End stage renal disease: Secondary | ICD-10-CM | POA: Diagnosis not present

## 2016-07-30 DIAGNOSIS — D631 Anemia in chronic kidney disease: Secondary | ICD-10-CM | POA: Diagnosis not present

## 2016-07-31 DIAGNOSIS — I10 Essential (primary) hypertension: Secondary | ICD-10-CM | POA: Diagnosis not present

## 2016-07-31 DIAGNOSIS — I2510 Atherosclerotic heart disease of native coronary artery without angina pectoris: Secondary | ICD-10-CM | POA: Diagnosis not present

## 2016-07-31 DIAGNOSIS — F3289 Other specified depressive episodes: Secondary | ICD-10-CM | POA: Diagnosis not present

## 2016-07-31 DIAGNOSIS — E1121 Type 2 diabetes mellitus with diabetic nephropathy: Secondary | ICD-10-CM | POA: Diagnosis not present

## 2016-08-01 DIAGNOSIS — Z23 Encounter for immunization: Secondary | ICD-10-CM | POA: Diagnosis not present

## 2016-08-01 DIAGNOSIS — D631 Anemia in chronic kidney disease: Secondary | ICD-10-CM | POA: Diagnosis not present

## 2016-08-01 DIAGNOSIS — Z992 Dependence on renal dialysis: Secondary | ICD-10-CM | POA: Diagnosis not present

## 2016-08-01 DIAGNOSIS — N186 End stage renal disease: Secondary | ICD-10-CM | POA: Diagnosis not present

## 2016-08-01 DIAGNOSIS — D509 Iron deficiency anemia, unspecified: Secondary | ICD-10-CM | POA: Diagnosis not present

## 2016-08-01 DIAGNOSIS — N2581 Secondary hyperparathyroidism of renal origin: Secondary | ICD-10-CM | POA: Diagnosis not present

## 2016-08-04 DIAGNOSIS — D509 Iron deficiency anemia, unspecified: Secondary | ICD-10-CM | POA: Diagnosis not present

## 2016-08-04 DIAGNOSIS — N186 End stage renal disease: Secondary | ICD-10-CM | POA: Diagnosis not present

## 2016-08-04 DIAGNOSIS — D631 Anemia in chronic kidney disease: Secondary | ICD-10-CM | POA: Diagnosis not present

## 2016-08-04 DIAGNOSIS — N2581 Secondary hyperparathyroidism of renal origin: Secondary | ICD-10-CM | POA: Diagnosis not present

## 2016-08-04 DIAGNOSIS — Z992 Dependence on renal dialysis: Secondary | ICD-10-CM | POA: Diagnosis not present

## 2016-08-04 DIAGNOSIS — Z23 Encounter for immunization: Secondary | ICD-10-CM | POA: Diagnosis not present

## 2016-08-06 DIAGNOSIS — Z992 Dependence on renal dialysis: Secondary | ICD-10-CM | POA: Diagnosis not present

## 2016-08-06 DIAGNOSIS — N2581 Secondary hyperparathyroidism of renal origin: Secondary | ICD-10-CM | POA: Diagnosis not present

## 2016-08-06 DIAGNOSIS — N186 End stage renal disease: Secondary | ICD-10-CM | POA: Diagnosis not present

## 2016-08-06 DIAGNOSIS — D631 Anemia in chronic kidney disease: Secondary | ICD-10-CM | POA: Diagnosis not present

## 2016-08-06 DIAGNOSIS — D509 Iron deficiency anemia, unspecified: Secondary | ICD-10-CM | POA: Diagnosis not present

## 2016-08-06 DIAGNOSIS — Z23 Encounter for immunization: Secondary | ICD-10-CM | POA: Diagnosis not present

## 2016-08-08 DIAGNOSIS — D509 Iron deficiency anemia, unspecified: Secondary | ICD-10-CM | POA: Diagnosis not present

## 2016-08-08 DIAGNOSIS — D631 Anemia in chronic kidney disease: Secondary | ICD-10-CM | POA: Diagnosis not present

## 2016-08-08 DIAGNOSIS — N2581 Secondary hyperparathyroidism of renal origin: Secondary | ICD-10-CM | POA: Diagnosis not present

## 2016-08-08 DIAGNOSIS — N186 End stage renal disease: Secondary | ICD-10-CM | POA: Diagnosis not present

## 2016-08-08 DIAGNOSIS — Z23 Encounter for immunization: Secondary | ICD-10-CM | POA: Diagnosis not present

## 2016-08-08 DIAGNOSIS — Z992 Dependence on renal dialysis: Secondary | ICD-10-CM | POA: Diagnosis not present

## 2016-08-11 DIAGNOSIS — N2581 Secondary hyperparathyroidism of renal origin: Secondary | ICD-10-CM | POA: Diagnosis not present

## 2016-08-11 DIAGNOSIS — Z23 Encounter for immunization: Secondary | ICD-10-CM | POA: Diagnosis not present

## 2016-08-11 DIAGNOSIS — N186 End stage renal disease: Secondary | ICD-10-CM | POA: Diagnosis not present

## 2016-08-11 DIAGNOSIS — D631 Anemia in chronic kidney disease: Secondary | ICD-10-CM | POA: Diagnosis not present

## 2016-08-11 DIAGNOSIS — Z992 Dependence on renal dialysis: Secondary | ICD-10-CM | POA: Diagnosis not present

## 2016-08-11 DIAGNOSIS — D509 Iron deficiency anemia, unspecified: Secondary | ICD-10-CM | POA: Diagnosis not present

## 2016-08-13 DIAGNOSIS — D509 Iron deficiency anemia, unspecified: Secondary | ICD-10-CM | POA: Diagnosis not present

## 2016-08-13 DIAGNOSIS — Z23 Encounter for immunization: Secondary | ICD-10-CM | POA: Diagnosis not present

## 2016-08-13 DIAGNOSIS — N2581 Secondary hyperparathyroidism of renal origin: Secondary | ICD-10-CM | POA: Diagnosis not present

## 2016-08-13 DIAGNOSIS — D631 Anemia in chronic kidney disease: Secondary | ICD-10-CM | POA: Diagnosis not present

## 2016-08-13 DIAGNOSIS — N186 End stage renal disease: Secondary | ICD-10-CM | POA: Diagnosis not present

## 2016-08-13 DIAGNOSIS — Z992 Dependence on renal dialysis: Secondary | ICD-10-CM | POA: Diagnosis not present

## 2016-08-15 DIAGNOSIS — N186 End stage renal disease: Secondary | ICD-10-CM | POA: Diagnosis not present

## 2016-08-15 DIAGNOSIS — D509 Iron deficiency anemia, unspecified: Secondary | ICD-10-CM | POA: Diagnosis not present

## 2016-08-15 DIAGNOSIS — D631 Anemia in chronic kidney disease: Secondary | ICD-10-CM | POA: Diagnosis not present

## 2016-08-15 DIAGNOSIS — Z992 Dependence on renal dialysis: Secondary | ICD-10-CM | POA: Diagnosis not present

## 2016-08-15 DIAGNOSIS — Z23 Encounter for immunization: Secondary | ICD-10-CM | POA: Diagnosis not present

## 2016-08-15 DIAGNOSIS — N2581 Secondary hyperparathyroidism of renal origin: Secondary | ICD-10-CM | POA: Diagnosis not present

## 2016-08-18 DIAGNOSIS — D631 Anemia in chronic kidney disease: Secondary | ICD-10-CM | POA: Diagnosis not present

## 2016-08-18 DIAGNOSIS — N186 End stage renal disease: Secondary | ICD-10-CM | POA: Diagnosis not present

## 2016-08-18 DIAGNOSIS — D509 Iron deficiency anemia, unspecified: Secondary | ICD-10-CM | POA: Diagnosis not present

## 2016-08-18 DIAGNOSIS — E119 Type 2 diabetes mellitus without complications: Secondary | ICD-10-CM | POA: Diagnosis not present

## 2016-08-18 DIAGNOSIS — Z23 Encounter for immunization: Secondary | ICD-10-CM | POA: Diagnosis not present

## 2016-08-18 DIAGNOSIS — Z992 Dependence on renal dialysis: Secondary | ICD-10-CM | POA: Diagnosis not present

## 2016-08-18 DIAGNOSIS — N2581 Secondary hyperparathyroidism of renal origin: Secondary | ICD-10-CM | POA: Diagnosis not present

## 2016-08-18 DIAGNOSIS — I259 Chronic ischemic heart disease, unspecified: Secondary | ICD-10-CM | POA: Diagnosis not present

## 2016-08-20 DIAGNOSIS — N2581 Secondary hyperparathyroidism of renal origin: Secondary | ICD-10-CM | POA: Diagnosis not present

## 2016-08-20 DIAGNOSIS — D509 Iron deficiency anemia, unspecified: Secondary | ICD-10-CM | POA: Diagnosis not present

## 2016-08-20 DIAGNOSIS — N186 End stage renal disease: Secondary | ICD-10-CM | POA: Diagnosis not present

## 2016-08-20 DIAGNOSIS — D631 Anemia in chronic kidney disease: Secondary | ICD-10-CM | POA: Diagnosis not present

## 2016-08-20 DIAGNOSIS — Z992 Dependence on renal dialysis: Secondary | ICD-10-CM | POA: Diagnosis not present

## 2016-08-20 DIAGNOSIS — Z23 Encounter for immunization: Secondary | ICD-10-CM | POA: Diagnosis not present

## 2016-08-22 DIAGNOSIS — Z992 Dependence on renal dialysis: Secondary | ICD-10-CM | POA: Diagnosis not present

## 2016-08-22 DIAGNOSIS — Z23 Encounter for immunization: Secondary | ICD-10-CM | POA: Diagnosis not present

## 2016-08-22 DIAGNOSIS — D631 Anemia in chronic kidney disease: Secondary | ICD-10-CM | POA: Diagnosis not present

## 2016-08-22 DIAGNOSIS — N2581 Secondary hyperparathyroidism of renal origin: Secondary | ICD-10-CM | POA: Diagnosis not present

## 2016-08-22 DIAGNOSIS — N186 End stage renal disease: Secondary | ICD-10-CM | POA: Diagnosis not present

## 2016-08-22 DIAGNOSIS — D509 Iron deficiency anemia, unspecified: Secondary | ICD-10-CM | POA: Diagnosis not present

## 2016-08-25 DIAGNOSIS — D509 Iron deficiency anemia, unspecified: Secondary | ICD-10-CM | POA: Diagnosis not present

## 2016-08-25 DIAGNOSIS — D631 Anemia in chronic kidney disease: Secondary | ICD-10-CM | POA: Diagnosis not present

## 2016-08-25 DIAGNOSIS — N186 End stage renal disease: Secondary | ICD-10-CM | POA: Diagnosis not present

## 2016-08-25 DIAGNOSIS — N2581 Secondary hyperparathyroidism of renal origin: Secondary | ICD-10-CM | POA: Diagnosis not present

## 2016-08-25 DIAGNOSIS — Z992 Dependence on renal dialysis: Secondary | ICD-10-CM | POA: Diagnosis not present

## 2016-08-27 DIAGNOSIS — N186 End stage renal disease: Secondary | ICD-10-CM | POA: Diagnosis not present

## 2016-08-27 DIAGNOSIS — D509 Iron deficiency anemia, unspecified: Secondary | ICD-10-CM | POA: Diagnosis not present

## 2016-08-27 DIAGNOSIS — Z992 Dependence on renal dialysis: Secondary | ICD-10-CM | POA: Diagnosis not present

## 2016-08-27 DIAGNOSIS — N2581 Secondary hyperparathyroidism of renal origin: Secondary | ICD-10-CM | POA: Diagnosis not present

## 2016-08-27 DIAGNOSIS — D631 Anemia in chronic kidney disease: Secondary | ICD-10-CM | POA: Diagnosis not present

## 2016-08-29 DIAGNOSIS — D631 Anemia in chronic kidney disease: Secondary | ICD-10-CM | POA: Diagnosis not present

## 2016-08-29 DIAGNOSIS — D509 Iron deficiency anemia, unspecified: Secondary | ICD-10-CM | POA: Diagnosis not present

## 2016-08-29 DIAGNOSIS — Z992 Dependence on renal dialysis: Secondary | ICD-10-CM | POA: Diagnosis not present

## 2016-08-29 DIAGNOSIS — N2581 Secondary hyperparathyroidism of renal origin: Secondary | ICD-10-CM | POA: Diagnosis not present

## 2016-08-29 DIAGNOSIS — N186 End stage renal disease: Secondary | ICD-10-CM | POA: Diagnosis not present

## 2016-09-01 DIAGNOSIS — N186 End stage renal disease: Secondary | ICD-10-CM | POA: Diagnosis not present

## 2016-09-01 DIAGNOSIS — N2581 Secondary hyperparathyroidism of renal origin: Secondary | ICD-10-CM | POA: Diagnosis not present

## 2016-09-01 DIAGNOSIS — D631 Anemia in chronic kidney disease: Secondary | ICD-10-CM | POA: Diagnosis not present

## 2016-09-01 DIAGNOSIS — D509 Iron deficiency anemia, unspecified: Secondary | ICD-10-CM | POA: Diagnosis not present

## 2016-09-01 DIAGNOSIS — Z992 Dependence on renal dialysis: Secondary | ICD-10-CM | POA: Diagnosis not present

## 2016-09-03 DIAGNOSIS — D509 Iron deficiency anemia, unspecified: Secondary | ICD-10-CM | POA: Diagnosis not present

## 2016-09-03 DIAGNOSIS — D631 Anemia in chronic kidney disease: Secondary | ICD-10-CM | POA: Diagnosis not present

## 2016-09-03 DIAGNOSIS — Z992 Dependence on renal dialysis: Secondary | ICD-10-CM | POA: Diagnosis not present

## 2016-09-03 DIAGNOSIS — N186 End stage renal disease: Secondary | ICD-10-CM | POA: Diagnosis not present

## 2016-09-03 DIAGNOSIS — N2581 Secondary hyperparathyroidism of renal origin: Secondary | ICD-10-CM | POA: Diagnosis not present

## 2016-09-05 DIAGNOSIS — N2581 Secondary hyperparathyroidism of renal origin: Secondary | ICD-10-CM | POA: Diagnosis not present

## 2016-09-05 DIAGNOSIS — D631 Anemia in chronic kidney disease: Secondary | ICD-10-CM | POA: Diagnosis not present

## 2016-09-05 DIAGNOSIS — D509 Iron deficiency anemia, unspecified: Secondary | ICD-10-CM | POA: Diagnosis not present

## 2016-09-05 DIAGNOSIS — Z992 Dependence on renal dialysis: Secondary | ICD-10-CM | POA: Diagnosis not present

## 2016-09-05 DIAGNOSIS — N186 End stage renal disease: Secondary | ICD-10-CM | POA: Diagnosis not present

## 2016-09-08 DIAGNOSIS — N2581 Secondary hyperparathyroidism of renal origin: Secondary | ICD-10-CM | POA: Diagnosis not present

## 2016-09-08 DIAGNOSIS — Z992 Dependence on renal dialysis: Secondary | ICD-10-CM | POA: Diagnosis not present

## 2016-09-08 DIAGNOSIS — D631 Anemia in chronic kidney disease: Secondary | ICD-10-CM | POA: Diagnosis not present

## 2016-09-08 DIAGNOSIS — D509 Iron deficiency anemia, unspecified: Secondary | ICD-10-CM | POA: Diagnosis not present

## 2016-09-08 DIAGNOSIS — N186 End stage renal disease: Secondary | ICD-10-CM | POA: Diagnosis not present

## 2016-09-10 DIAGNOSIS — D509 Iron deficiency anemia, unspecified: Secondary | ICD-10-CM | POA: Diagnosis not present

## 2016-09-10 DIAGNOSIS — N2581 Secondary hyperparathyroidism of renal origin: Secondary | ICD-10-CM | POA: Diagnosis not present

## 2016-09-10 DIAGNOSIS — N186 End stage renal disease: Secondary | ICD-10-CM | POA: Diagnosis not present

## 2016-09-10 DIAGNOSIS — Z992 Dependence on renal dialysis: Secondary | ICD-10-CM | POA: Diagnosis not present

## 2016-09-10 DIAGNOSIS — D631 Anemia in chronic kidney disease: Secondary | ICD-10-CM | POA: Diagnosis not present

## 2016-09-12 DIAGNOSIS — Z992 Dependence on renal dialysis: Secondary | ICD-10-CM | POA: Diagnosis not present

## 2016-09-12 DIAGNOSIS — D631 Anemia in chronic kidney disease: Secondary | ICD-10-CM | POA: Diagnosis not present

## 2016-09-12 DIAGNOSIS — N186 End stage renal disease: Secondary | ICD-10-CM | POA: Diagnosis not present

## 2016-09-12 DIAGNOSIS — N2581 Secondary hyperparathyroidism of renal origin: Secondary | ICD-10-CM | POA: Diagnosis not present

## 2016-09-12 DIAGNOSIS — D509 Iron deficiency anemia, unspecified: Secondary | ICD-10-CM | POA: Diagnosis not present

## 2016-09-15 DIAGNOSIS — D509 Iron deficiency anemia, unspecified: Secondary | ICD-10-CM | POA: Diagnosis not present

## 2016-09-15 DIAGNOSIS — Z992 Dependence on renal dialysis: Secondary | ICD-10-CM | POA: Diagnosis not present

## 2016-09-15 DIAGNOSIS — N2581 Secondary hyperparathyroidism of renal origin: Secondary | ICD-10-CM | POA: Diagnosis not present

## 2016-09-15 DIAGNOSIS — D631 Anemia in chronic kidney disease: Secondary | ICD-10-CM | POA: Diagnosis not present

## 2016-09-15 DIAGNOSIS — N186 End stage renal disease: Secondary | ICD-10-CM | POA: Diagnosis not present

## 2016-09-17 DIAGNOSIS — Z992 Dependence on renal dialysis: Secondary | ICD-10-CM | POA: Diagnosis not present

## 2016-09-17 DIAGNOSIS — D631 Anemia in chronic kidney disease: Secondary | ICD-10-CM | POA: Diagnosis not present

## 2016-09-17 DIAGNOSIS — N2581 Secondary hyperparathyroidism of renal origin: Secondary | ICD-10-CM | POA: Diagnosis not present

## 2016-09-17 DIAGNOSIS — D509 Iron deficiency anemia, unspecified: Secondary | ICD-10-CM | POA: Diagnosis not present

## 2016-09-17 DIAGNOSIS — N186 End stage renal disease: Secondary | ICD-10-CM | POA: Diagnosis not present

## 2016-09-19 DIAGNOSIS — D631 Anemia in chronic kidney disease: Secondary | ICD-10-CM | POA: Diagnosis not present

## 2016-09-19 DIAGNOSIS — Z992 Dependence on renal dialysis: Secondary | ICD-10-CM | POA: Diagnosis not present

## 2016-09-19 DIAGNOSIS — N186 End stage renal disease: Secondary | ICD-10-CM | POA: Diagnosis not present

## 2016-09-19 DIAGNOSIS — N2581 Secondary hyperparathyroidism of renal origin: Secondary | ICD-10-CM | POA: Diagnosis not present

## 2016-09-19 DIAGNOSIS — D509 Iron deficiency anemia, unspecified: Secondary | ICD-10-CM | POA: Diagnosis not present

## 2016-09-22 DIAGNOSIS — D631 Anemia in chronic kidney disease: Secondary | ICD-10-CM | POA: Diagnosis not present

## 2016-09-22 DIAGNOSIS — N186 End stage renal disease: Secondary | ICD-10-CM | POA: Diagnosis not present

## 2016-09-22 DIAGNOSIS — Z992 Dependence on renal dialysis: Secondary | ICD-10-CM | POA: Diagnosis not present

## 2016-09-22 DIAGNOSIS — N2581 Secondary hyperparathyroidism of renal origin: Secondary | ICD-10-CM | POA: Diagnosis not present

## 2016-09-22 DIAGNOSIS — D509 Iron deficiency anemia, unspecified: Secondary | ICD-10-CM | POA: Diagnosis not present

## 2016-09-24 DIAGNOSIS — N186 End stage renal disease: Secondary | ICD-10-CM | POA: Diagnosis not present

## 2016-09-24 DIAGNOSIS — N2581 Secondary hyperparathyroidism of renal origin: Secondary | ICD-10-CM | POA: Diagnosis not present

## 2016-09-24 DIAGNOSIS — D631 Anemia in chronic kidney disease: Secondary | ICD-10-CM | POA: Diagnosis not present

## 2016-09-24 DIAGNOSIS — D509 Iron deficiency anemia, unspecified: Secondary | ICD-10-CM | POA: Diagnosis not present

## 2016-09-24 DIAGNOSIS — Z992 Dependence on renal dialysis: Secondary | ICD-10-CM | POA: Diagnosis not present

## 2016-09-26 DIAGNOSIS — D631 Anemia in chronic kidney disease: Secondary | ICD-10-CM | POA: Diagnosis not present

## 2016-09-26 DIAGNOSIS — D509 Iron deficiency anemia, unspecified: Secondary | ICD-10-CM | POA: Diagnosis not present

## 2016-09-26 DIAGNOSIS — N2581 Secondary hyperparathyroidism of renal origin: Secondary | ICD-10-CM | POA: Diagnosis not present

## 2016-09-26 DIAGNOSIS — Z992 Dependence on renal dialysis: Secondary | ICD-10-CM | POA: Diagnosis not present

## 2016-09-26 DIAGNOSIS — N186 End stage renal disease: Secondary | ICD-10-CM | POA: Diagnosis not present

## 2016-09-29 DIAGNOSIS — N186 End stage renal disease: Secondary | ICD-10-CM | POA: Diagnosis not present

## 2016-09-29 DIAGNOSIS — D509 Iron deficiency anemia, unspecified: Secondary | ICD-10-CM | POA: Diagnosis not present

## 2016-09-29 DIAGNOSIS — Z992 Dependence on renal dialysis: Secondary | ICD-10-CM | POA: Diagnosis not present

## 2016-09-29 DIAGNOSIS — D631 Anemia in chronic kidney disease: Secondary | ICD-10-CM | POA: Diagnosis not present

## 2016-09-29 DIAGNOSIS — N2581 Secondary hyperparathyroidism of renal origin: Secondary | ICD-10-CM | POA: Diagnosis not present

## 2016-10-01 DIAGNOSIS — Z992 Dependence on renal dialysis: Secondary | ICD-10-CM | POA: Diagnosis not present

## 2016-10-01 DIAGNOSIS — N2581 Secondary hyperparathyroidism of renal origin: Secondary | ICD-10-CM | POA: Diagnosis not present

## 2016-10-01 DIAGNOSIS — D509 Iron deficiency anemia, unspecified: Secondary | ICD-10-CM | POA: Diagnosis not present

## 2016-10-01 DIAGNOSIS — N186 End stage renal disease: Secondary | ICD-10-CM | POA: Diagnosis not present

## 2016-10-01 DIAGNOSIS — D631 Anemia in chronic kidney disease: Secondary | ICD-10-CM | POA: Diagnosis not present

## 2016-10-03 DIAGNOSIS — Z992 Dependence on renal dialysis: Secondary | ICD-10-CM | POA: Diagnosis not present

## 2016-10-03 DIAGNOSIS — N2581 Secondary hyperparathyroidism of renal origin: Secondary | ICD-10-CM | POA: Diagnosis not present

## 2016-10-03 DIAGNOSIS — D631 Anemia in chronic kidney disease: Secondary | ICD-10-CM | POA: Diagnosis not present

## 2016-10-03 DIAGNOSIS — N186 End stage renal disease: Secondary | ICD-10-CM | POA: Diagnosis not present

## 2016-10-03 DIAGNOSIS — D509 Iron deficiency anemia, unspecified: Secondary | ICD-10-CM | POA: Diagnosis not present

## 2016-10-06 DIAGNOSIS — Z992 Dependence on renal dialysis: Secondary | ICD-10-CM | POA: Diagnosis not present

## 2016-10-06 DIAGNOSIS — D631 Anemia in chronic kidney disease: Secondary | ICD-10-CM | POA: Diagnosis not present

## 2016-10-06 DIAGNOSIS — N2581 Secondary hyperparathyroidism of renal origin: Secondary | ICD-10-CM | POA: Diagnosis not present

## 2016-10-06 DIAGNOSIS — D509 Iron deficiency anemia, unspecified: Secondary | ICD-10-CM | POA: Diagnosis not present

## 2016-10-06 DIAGNOSIS — N186 End stage renal disease: Secondary | ICD-10-CM | POA: Diagnosis not present

## 2016-10-08 DIAGNOSIS — N2581 Secondary hyperparathyroidism of renal origin: Secondary | ICD-10-CM | POA: Diagnosis not present

## 2016-10-08 DIAGNOSIS — D509 Iron deficiency anemia, unspecified: Secondary | ICD-10-CM | POA: Diagnosis not present

## 2016-10-08 DIAGNOSIS — D631 Anemia in chronic kidney disease: Secondary | ICD-10-CM | POA: Diagnosis not present

## 2016-10-08 DIAGNOSIS — N186 End stage renal disease: Secondary | ICD-10-CM | POA: Diagnosis not present

## 2016-10-08 DIAGNOSIS — Z992 Dependence on renal dialysis: Secondary | ICD-10-CM | POA: Diagnosis not present

## 2016-10-12 DIAGNOSIS — D631 Anemia in chronic kidney disease: Secondary | ICD-10-CM | POA: Diagnosis not present

## 2016-10-12 DIAGNOSIS — N2581 Secondary hyperparathyroidism of renal origin: Secondary | ICD-10-CM | POA: Diagnosis not present

## 2016-10-12 DIAGNOSIS — N186 End stage renal disease: Secondary | ICD-10-CM | POA: Diagnosis not present

## 2016-10-12 DIAGNOSIS — D509 Iron deficiency anemia, unspecified: Secondary | ICD-10-CM | POA: Diagnosis not present

## 2016-10-12 DIAGNOSIS — Z992 Dependence on renal dialysis: Secondary | ICD-10-CM | POA: Diagnosis not present

## 2016-10-15 DIAGNOSIS — N186 End stage renal disease: Secondary | ICD-10-CM | POA: Diagnosis not present

## 2016-10-15 DIAGNOSIS — D631 Anemia in chronic kidney disease: Secondary | ICD-10-CM | POA: Diagnosis not present

## 2016-10-15 DIAGNOSIS — D509 Iron deficiency anemia, unspecified: Secondary | ICD-10-CM | POA: Diagnosis not present

## 2016-10-15 DIAGNOSIS — N2581 Secondary hyperparathyroidism of renal origin: Secondary | ICD-10-CM | POA: Diagnosis not present

## 2016-10-15 DIAGNOSIS — Z992 Dependence on renal dialysis: Secondary | ICD-10-CM | POA: Diagnosis not present

## 2016-10-17 DIAGNOSIS — Z992 Dependence on renal dialysis: Secondary | ICD-10-CM | POA: Diagnosis not present

## 2016-10-17 DIAGNOSIS — D509 Iron deficiency anemia, unspecified: Secondary | ICD-10-CM | POA: Diagnosis not present

## 2016-10-17 DIAGNOSIS — D631 Anemia in chronic kidney disease: Secondary | ICD-10-CM | POA: Diagnosis not present

## 2016-10-17 DIAGNOSIS — N2581 Secondary hyperparathyroidism of renal origin: Secondary | ICD-10-CM | POA: Diagnosis not present

## 2016-10-17 DIAGNOSIS — N186 End stage renal disease: Secondary | ICD-10-CM | POA: Diagnosis not present

## 2016-10-21 DIAGNOSIS — D509 Iron deficiency anemia, unspecified: Secondary | ICD-10-CM | POA: Diagnosis not present

## 2016-10-21 DIAGNOSIS — D631 Anemia in chronic kidney disease: Secondary | ICD-10-CM | POA: Diagnosis not present

## 2016-10-21 DIAGNOSIS — N186 End stage renal disease: Secondary | ICD-10-CM | POA: Diagnosis not present

## 2016-10-21 DIAGNOSIS — N2581 Secondary hyperparathyroidism of renal origin: Secondary | ICD-10-CM | POA: Diagnosis not present

## 2016-10-21 DIAGNOSIS — Z992 Dependence on renal dialysis: Secondary | ICD-10-CM | POA: Diagnosis not present

## 2016-10-22 DIAGNOSIS — N186 End stage renal disease: Secondary | ICD-10-CM | POA: Diagnosis not present

## 2016-10-22 DIAGNOSIS — Z992 Dependence on renal dialysis: Secondary | ICD-10-CM | POA: Diagnosis not present

## 2016-10-22 DIAGNOSIS — D509 Iron deficiency anemia, unspecified: Secondary | ICD-10-CM | POA: Diagnosis not present

## 2016-10-22 DIAGNOSIS — N2581 Secondary hyperparathyroidism of renal origin: Secondary | ICD-10-CM | POA: Diagnosis not present

## 2016-10-22 DIAGNOSIS — D631 Anemia in chronic kidney disease: Secondary | ICD-10-CM | POA: Diagnosis not present

## 2016-10-24 DIAGNOSIS — N186 End stage renal disease: Secondary | ICD-10-CM | POA: Diagnosis not present

## 2016-10-24 DIAGNOSIS — Z23 Encounter for immunization: Secondary | ICD-10-CM | POA: Diagnosis not present

## 2016-10-24 DIAGNOSIS — D631 Anemia in chronic kidney disease: Secondary | ICD-10-CM | POA: Diagnosis not present

## 2016-10-24 DIAGNOSIS — Z992 Dependence on renal dialysis: Secondary | ICD-10-CM | POA: Diagnosis not present

## 2016-10-24 DIAGNOSIS — N2581 Secondary hyperparathyroidism of renal origin: Secondary | ICD-10-CM | POA: Diagnosis not present

## 2016-10-24 DIAGNOSIS — D509 Iron deficiency anemia, unspecified: Secondary | ICD-10-CM | POA: Diagnosis not present

## 2016-10-28 DIAGNOSIS — D631 Anemia in chronic kidney disease: Secondary | ICD-10-CM | POA: Diagnosis not present

## 2016-10-28 DIAGNOSIS — N2581 Secondary hyperparathyroidism of renal origin: Secondary | ICD-10-CM | POA: Diagnosis not present

## 2016-10-28 DIAGNOSIS — D509 Iron deficiency anemia, unspecified: Secondary | ICD-10-CM | POA: Diagnosis not present

## 2016-10-28 DIAGNOSIS — Z992 Dependence on renal dialysis: Secondary | ICD-10-CM | POA: Diagnosis not present

## 2016-10-28 DIAGNOSIS — N186 End stage renal disease: Secondary | ICD-10-CM | POA: Diagnosis not present

## 2016-10-28 DIAGNOSIS — Z23 Encounter for immunization: Secondary | ICD-10-CM | POA: Diagnosis not present

## 2016-10-29 DIAGNOSIS — D509 Iron deficiency anemia, unspecified: Secondary | ICD-10-CM | POA: Diagnosis not present

## 2016-10-29 DIAGNOSIS — N2581 Secondary hyperparathyroidism of renal origin: Secondary | ICD-10-CM | POA: Diagnosis not present

## 2016-10-29 DIAGNOSIS — Z23 Encounter for immunization: Secondary | ICD-10-CM | POA: Diagnosis not present

## 2016-10-29 DIAGNOSIS — N186 End stage renal disease: Secondary | ICD-10-CM | POA: Diagnosis not present

## 2016-10-29 DIAGNOSIS — D631 Anemia in chronic kidney disease: Secondary | ICD-10-CM | POA: Diagnosis not present

## 2016-10-29 DIAGNOSIS — Z992 Dependence on renal dialysis: Secondary | ICD-10-CM | POA: Diagnosis not present

## 2016-10-31 DIAGNOSIS — D509 Iron deficiency anemia, unspecified: Secondary | ICD-10-CM | POA: Diagnosis not present

## 2016-10-31 DIAGNOSIS — Z23 Encounter for immunization: Secondary | ICD-10-CM | POA: Diagnosis not present

## 2016-10-31 DIAGNOSIS — N186 End stage renal disease: Secondary | ICD-10-CM | POA: Diagnosis not present

## 2016-10-31 DIAGNOSIS — Z992 Dependence on renal dialysis: Secondary | ICD-10-CM | POA: Diagnosis not present

## 2016-10-31 DIAGNOSIS — N2581 Secondary hyperparathyroidism of renal origin: Secondary | ICD-10-CM | POA: Diagnosis not present

## 2016-10-31 DIAGNOSIS — D631 Anemia in chronic kidney disease: Secondary | ICD-10-CM | POA: Diagnosis not present

## 2016-11-03 DIAGNOSIS — N2581 Secondary hyperparathyroidism of renal origin: Secondary | ICD-10-CM | POA: Diagnosis not present

## 2016-11-03 DIAGNOSIS — D631 Anemia in chronic kidney disease: Secondary | ICD-10-CM | POA: Diagnosis not present

## 2016-11-03 DIAGNOSIS — Z23 Encounter for immunization: Secondary | ICD-10-CM | POA: Diagnosis not present

## 2016-11-03 DIAGNOSIS — D509 Iron deficiency anemia, unspecified: Secondary | ICD-10-CM | POA: Diagnosis not present

## 2016-11-03 DIAGNOSIS — Z992 Dependence on renal dialysis: Secondary | ICD-10-CM | POA: Diagnosis not present

## 2016-11-03 DIAGNOSIS — N186 End stage renal disease: Secondary | ICD-10-CM | POA: Diagnosis not present

## 2016-11-05 DIAGNOSIS — N186 End stage renal disease: Secondary | ICD-10-CM | POA: Diagnosis not present

## 2016-11-05 DIAGNOSIS — D631 Anemia in chronic kidney disease: Secondary | ICD-10-CM | POA: Diagnosis not present

## 2016-11-05 DIAGNOSIS — N2581 Secondary hyperparathyroidism of renal origin: Secondary | ICD-10-CM | POA: Diagnosis not present

## 2016-11-05 DIAGNOSIS — D509 Iron deficiency anemia, unspecified: Secondary | ICD-10-CM | POA: Diagnosis not present

## 2016-11-05 DIAGNOSIS — Z23 Encounter for immunization: Secondary | ICD-10-CM | POA: Diagnosis not present

## 2016-11-05 DIAGNOSIS — Z992 Dependence on renal dialysis: Secondary | ICD-10-CM | POA: Diagnosis not present

## 2016-11-07 DIAGNOSIS — N2581 Secondary hyperparathyroidism of renal origin: Secondary | ICD-10-CM | POA: Diagnosis not present

## 2016-11-07 DIAGNOSIS — D631 Anemia in chronic kidney disease: Secondary | ICD-10-CM | POA: Diagnosis not present

## 2016-11-07 DIAGNOSIS — D509 Iron deficiency anemia, unspecified: Secondary | ICD-10-CM | POA: Diagnosis not present

## 2016-11-07 DIAGNOSIS — Z992 Dependence on renal dialysis: Secondary | ICD-10-CM | POA: Diagnosis not present

## 2016-11-07 DIAGNOSIS — N186 End stage renal disease: Secondary | ICD-10-CM | POA: Diagnosis not present

## 2016-11-07 DIAGNOSIS — Z23 Encounter for immunization: Secondary | ICD-10-CM | POA: Diagnosis not present

## 2016-11-10 DIAGNOSIS — D631 Anemia in chronic kidney disease: Secondary | ICD-10-CM | POA: Diagnosis not present

## 2016-11-10 DIAGNOSIS — N2581 Secondary hyperparathyroidism of renal origin: Secondary | ICD-10-CM | POA: Diagnosis not present

## 2016-11-10 DIAGNOSIS — N186 End stage renal disease: Secondary | ICD-10-CM | POA: Diagnosis not present

## 2016-11-10 DIAGNOSIS — Z23 Encounter for immunization: Secondary | ICD-10-CM | POA: Diagnosis not present

## 2016-11-10 DIAGNOSIS — Z992 Dependence on renal dialysis: Secondary | ICD-10-CM | POA: Diagnosis not present

## 2016-11-10 DIAGNOSIS — D509 Iron deficiency anemia, unspecified: Secondary | ICD-10-CM | POA: Diagnosis not present

## 2016-11-12 DIAGNOSIS — N186 End stage renal disease: Secondary | ICD-10-CM | POA: Diagnosis not present

## 2016-11-12 DIAGNOSIS — D509 Iron deficiency anemia, unspecified: Secondary | ICD-10-CM | POA: Diagnosis not present

## 2016-11-12 DIAGNOSIS — D631 Anemia in chronic kidney disease: Secondary | ICD-10-CM | POA: Diagnosis not present

## 2016-11-12 DIAGNOSIS — Z992 Dependence on renal dialysis: Secondary | ICD-10-CM | POA: Diagnosis not present

## 2016-11-12 DIAGNOSIS — N2581 Secondary hyperparathyroidism of renal origin: Secondary | ICD-10-CM | POA: Diagnosis not present

## 2016-11-12 DIAGNOSIS — Z23 Encounter for immunization: Secondary | ICD-10-CM | POA: Diagnosis not present

## 2016-11-13 DIAGNOSIS — Z0000 Encounter for general adult medical examination without abnormal findings: Secondary | ICD-10-CM | POA: Diagnosis not present

## 2016-11-13 DIAGNOSIS — E1121 Type 2 diabetes mellitus with diabetic nephropathy: Secondary | ICD-10-CM | POA: Diagnosis not present

## 2016-11-13 DIAGNOSIS — F3289 Other specified depressive episodes: Secondary | ICD-10-CM | POA: Diagnosis not present

## 2016-11-13 DIAGNOSIS — Z1389 Encounter for screening for other disorder: Secondary | ICD-10-CM | POA: Diagnosis not present

## 2016-11-13 DIAGNOSIS — I2510 Atherosclerotic heart disease of native coronary artery without angina pectoris: Secondary | ICD-10-CM | POA: Diagnosis not present

## 2016-11-13 DIAGNOSIS — I10 Essential (primary) hypertension: Secondary | ICD-10-CM | POA: Diagnosis not present

## 2016-11-14 DIAGNOSIS — D631 Anemia in chronic kidney disease: Secondary | ICD-10-CM | POA: Diagnosis not present

## 2016-11-14 DIAGNOSIS — Z992 Dependence on renal dialysis: Secondary | ICD-10-CM | POA: Diagnosis not present

## 2016-11-14 DIAGNOSIS — N186 End stage renal disease: Secondary | ICD-10-CM | POA: Diagnosis not present

## 2016-11-14 DIAGNOSIS — D509 Iron deficiency anemia, unspecified: Secondary | ICD-10-CM | POA: Diagnosis not present

## 2016-11-14 DIAGNOSIS — Z23 Encounter for immunization: Secondary | ICD-10-CM | POA: Diagnosis not present

## 2016-11-14 DIAGNOSIS — N2581 Secondary hyperparathyroidism of renal origin: Secondary | ICD-10-CM | POA: Diagnosis not present

## 2016-11-17 DIAGNOSIS — E119 Type 2 diabetes mellitus without complications: Secondary | ICD-10-CM | POA: Diagnosis not present

## 2016-11-17 DIAGNOSIS — N2581 Secondary hyperparathyroidism of renal origin: Secondary | ICD-10-CM | POA: Diagnosis not present

## 2016-11-17 DIAGNOSIS — Z992 Dependence on renal dialysis: Secondary | ICD-10-CM | POA: Diagnosis not present

## 2016-11-17 DIAGNOSIS — N186 End stage renal disease: Secondary | ICD-10-CM | POA: Diagnosis not present

## 2016-11-17 DIAGNOSIS — D509 Iron deficiency anemia, unspecified: Secondary | ICD-10-CM | POA: Diagnosis not present

## 2016-11-17 DIAGNOSIS — Z23 Encounter for immunization: Secondary | ICD-10-CM | POA: Diagnosis not present

## 2016-11-17 DIAGNOSIS — D631 Anemia in chronic kidney disease: Secondary | ICD-10-CM | POA: Diagnosis not present

## 2016-11-19 DIAGNOSIS — D509 Iron deficiency anemia, unspecified: Secondary | ICD-10-CM | POA: Diagnosis not present

## 2016-11-19 DIAGNOSIS — Z992 Dependence on renal dialysis: Secondary | ICD-10-CM | POA: Diagnosis not present

## 2016-11-19 DIAGNOSIS — Z23 Encounter for immunization: Secondary | ICD-10-CM | POA: Diagnosis not present

## 2016-11-19 DIAGNOSIS — N2581 Secondary hyperparathyroidism of renal origin: Secondary | ICD-10-CM | POA: Diagnosis not present

## 2016-11-19 DIAGNOSIS — D631 Anemia in chronic kidney disease: Secondary | ICD-10-CM | POA: Diagnosis not present

## 2016-11-19 DIAGNOSIS — N186 End stage renal disease: Secondary | ICD-10-CM | POA: Diagnosis not present

## 2016-11-21 DIAGNOSIS — N186 End stage renal disease: Secondary | ICD-10-CM | POA: Diagnosis not present

## 2016-11-21 DIAGNOSIS — N2581 Secondary hyperparathyroidism of renal origin: Secondary | ICD-10-CM | POA: Diagnosis not present

## 2016-11-21 DIAGNOSIS — D509 Iron deficiency anemia, unspecified: Secondary | ICD-10-CM | POA: Diagnosis not present

## 2016-11-21 DIAGNOSIS — Z992 Dependence on renal dialysis: Secondary | ICD-10-CM | POA: Diagnosis not present

## 2016-11-21 DIAGNOSIS — Z23 Encounter for immunization: Secondary | ICD-10-CM | POA: Diagnosis not present

## 2016-11-21 DIAGNOSIS — D631 Anemia in chronic kidney disease: Secondary | ICD-10-CM | POA: Diagnosis not present

## 2016-11-22 DIAGNOSIS — Z992 Dependence on renal dialysis: Secondary | ICD-10-CM | POA: Diagnosis not present

## 2016-11-22 DIAGNOSIS — N186 End stage renal disease: Secondary | ICD-10-CM | POA: Diagnosis not present

## 2016-11-24 DIAGNOSIS — D631 Anemia in chronic kidney disease: Secondary | ICD-10-CM | POA: Diagnosis not present

## 2016-11-24 DIAGNOSIS — N186 End stage renal disease: Secondary | ICD-10-CM | POA: Diagnosis not present

## 2016-11-24 DIAGNOSIS — N2581 Secondary hyperparathyroidism of renal origin: Secondary | ICD-10-CM | POA: Diagnosis not present

## 2016-11-24 DIAGNOSIS — Z23 Encounter for immunization: Secondary | ICD-10-CM | POA: Diagnosis not present

## 2016-11-24 DIAGNOSIS — Z992 Dependence on renal dialysis: Secondary | ICD-10-CM | POA: Diagnosis not present

## 2016-11-24 DIAGNOSIS — D509 Iron deficiency anemia, unspecified: Secondary | ICD-10-CM | POA: Diagnosis not present

## 2016-11-26 DIAGNOSIS — N186 End stage renal disease: Secondary | ICD-10-CM | POA: Diagnosis not present

## 2016-11-26 DIAGNOSIS — D631 Anemia in chronic kidney disease: Secondary | ICD-10-CM | POA: Diagnosis not present

## 2016-11-26 DIAGNOSIS — Z992 Dependence on renal dialysis: Secondary | ICD-10-CM | POA: Diagnosis not present

## 2016-11-26 DIAGNOSIS — Z23 Encounter for immunization: Secondary | ICD-10-CM | POA: Diagnosis not present

## 2016-11-26 DIAGNOSIS — D509 Iron deficiency anemia, unspecified: Secondary | ICD-10-CM | POA: Diagnosis not present

## 2016-11-26 DIAGNOSIS — N2581 Secondary hyperparathyroidism of renal origin: Secondary | ICD-10-CM | POA: Diagnosis not present

## 2016-11-28 DIAGNOSIS — D509 Iron deficiency anemia, unspecified: Secondary | ICD-10-CM | POA: Diagnosis not present

## 2016-11-28 DIAGNOSIS — Z992 Dependence on renal dialysis: Secondary | ICD-10-CM | POA: Diagnosis not present

## 2016-11-28 DIAGNOSIS — D631 Anemia in chronic kidney disease: Secondary | ICD-10-CM | POA: Diagnosis not present

## 2016-11-28 DIAGNOSIS — N2581 Secondary hyperparathyroidism of renal origin: Secondary | ICD-10-CM | POA: Diagnosis not present

## 2016-11-28 DIAGNOSIS — N186 End stage renal disease: Secondary | ICD-10-CM | POA: Diagnosis not present

## 2016-11-28 DIAGNOSIS — Z23 Encounter for immunization: Secondary | ICD-10-CM | POA: Diagnosis not present

## 2016-12-01 DIAGNOSIS — N2581 Secondary hyperparathyroidism of renal origin: Secondary | ICD-10-CM | POA: Diagnosis not present

## 2016-12-01 DIAGNOSIS — D509 Iron deficiency anemia, unspecified: Secondary | ICD-10-CM | POA: Diagnosis not present

## 2016-12-01 DIAGNOSIS — D631 Anemia in chronic kidney disease: Secondary | ICD-10-CM | POA: Diagnosis not present

## 2016-12-01 DIAGNOSIS — Z23 Encounter for immunization: Secondary | ICD-10-CM | POA: Diagnosis not present

## 2016-12-01 DIAGNOSIS — N186 End stage renal disease: Secondary | ICD-10-CM | POA: Diagnosis not present

## 2016-12-01 DIAGNOSIS — Z992 Dependence on renal dialysis: Secondary | ICD-10-CM | POA: Diagnosis not present

## 2016-12-03 DIAGNOSIS — D509 Iron deficiency anemia, unspecified: Secondary | ICD-10-CM | POA: Diagnosis not present

## 2016-12-03 DIAGNOSIS — Z23 Encounter for immunization: Secondary | ICD-10-CM | POA: Diagnosis not present

## 2016-12-03 DIAGNOSIS — Z992 Dependence on renal dialysis: Secondary | ICD-10-CM | POA: Diagnosis not present

## 2016-12-03 DIAGNOSIS — N2581 Secondary hyperparathyroidism of renal origin: Secondary | ICD-10-CM | POA: Diagnosis not present

## 2016-12-03 DIAGNOSIS — D631 Anemia in chronic kidney disease: Secondary | ICD-10-CM | POA: Diagnosis not present

## 2016-12-03 DIAGNOSIS — N186 End stage renal disease: Secondary | ICD-10-CM | POA: Diagnosis not present

## 2016-12-05 DIAGNOSIS — D631 Anemia in chronic kidney disease: Secondary | ICD-10-CM | POA: Diagnosis not present

## 2016-12-05 DIAGNOSIS — N186 End stage renal disease: Secondary | ICD-10-CM | POA: Diagnosis not present

## 2016-12-05 DIAGNOSIS — Z992 Dependence on renal dialysis: Secondary | ICD-10-CM | POA: Diagnosis not present

## 2016-12-05 DIAGNOSIS — D509 Iron deficiency anemia, unspecified: Secondary | ICD-10-CM | POA: Diagnosis not present

## 2016-12-05 DIAGNOSIS — Z23 Encounter for immunization: Secondary | ICD-10-CM | POA: Diagnosis not present

## 2016-12-05 DIAGNOSIS — N2581 Secondary hyperparathyroidism of renal origin: Secondary | ICD-10-CM | POA: Diagnosis not present

## 2016-12-08 DIAGNOSIS — Z992 Dependence on renal dialysis: Secondary | ICD-10-CM | POA: Diagnosis not present

## 2016-12-08 DIAGNOSIS — N186 End stage renal disease: Secondary | ICD-10-CM | POA: Diagnosis not present

## 2016-12-08 DIAGNOSIS — N2581 Secondary hyperparathyroidism of renal origin: Secondary | ICD-10-CM | POA: Diagnosis not present

## 2016-12-08 DIAGNOSIS — D631 Anemia in chronic kidney disease: Secondary | ICD-10-CM | POA: Diagnosis not present

## 2016-12-08 DIAGNOSIS — D509 Iron deficiency anemia, unspecified: Secondary | ICD-10-CM | POA: Diagnosis not present

## 2016-12-08 DIAGNOSIS — Z23 Encounter for immunization: Secondary | ICD-10-CM | POA: Diagnosis not present

## 2016-12-10 DIAGNOSIS — Z992 Dependence on renal dialysis: Secondary | ICD-10-CM | POA: Diagnosis not present

## 2016-12-10 DIAGNOSIS — N186 End stage renal disease: Secondary | ICD-10-CM | POA: Diagnosis not present

## 2016-12-10 DIAGNOSIS — D631 Anemia in chronic kidney disease: Secondary | ICD-10-CM | POA: Diagnosis not present

## 2016-12-10 DIAGNOSIS — D509 Iron deficiency anemia, unspecified: Secondary | ICD-10-CM | POA: Diagnosis not present

## 2016-12-10 DIAGNOSIS — Z23 Encounter for immunization: Secondary | ICD-10-CM | POA: Diagnosis not present

## 2016-12-10 DIAGNOSIS — N2581 Secondary hyperparathyroidism of renal origin: Secondary | ICD-10-CM | POA: Diagnosis not present

## 2016-12-12 DIAGNOSIS — D631 Anemia in chronic kidney disease: Secondary | ICD-10-CM | POA: Diagnosis not present

## 2016-12-12 DIAGNOSIS — N186 End stage renal disease: Secondary | ICD-10-CM | POA: Diagnosis not present

## 2016-12-12 DIAGNOSIS — D509 Iron deficiency anemia, unspecified: Secondary | ICD-10-CM | POA: Diagnosis not present

## 2016-12-12 DIAGNOSIS — Z23 Encounter for immunization: Secondary | ICD-10-CM | POA: Diagnosis not present

## 2016-12-12 DIAGNOSIS — N2581 Secondary hyperparathyroidism of renal origin: Secondary | ICD-10-CM | POA: Diagnosis not present

## 2016-12-12 DIAGNOSIS — Z992 Dependence on renal dialysis: Secondary | ICD-10-CM | POA: Diagnosis not present

## 2016-12-15 DIAGNOSIS — D631 Anemia in chronic kidney disease: Secondary | ICD-10-CM | POA: Diagnosis not present

## 2016-12-15 DIAGNOSIS — Z992 Dependence on renal dialysis: Secondary | ICD-10-CM | POA: Diagnosis not present

## 2016-12-15 DIAGNOSIS — D509 Iron deficiency anemia, unspecified: Secondary | ICD-10-CM | POA: Diagnosis not present

## 2016-12-15 DIAGNOSIS — N2581 Secondary hyperparathyroidism of renal origin: Secondary | ICD-10-CM | POA: Diagnosis not present

## 2016-12-15 DIAGNOSIS — N186 End stage renal disease: Secondary | ICD-10-CM | POA: Diagnosis not present

## 2016-12-15 DIAGNOSIS — Z23 Encounter for immunization: Secondary | ICD-10-CM | POA: Diagnosis not present

## 2016-12-17 DIAGNOSIS — D631 Anemia in chronic kidney disease: Secondary | ICD-10-CM | POA: Diagnosis not present

## 2016-12-17 DIAGNOSIS — D509 Iron deficiency anemia, unspecified: Secondary | ICD-10-CM | POA: Diagnosis not present

## 2016-12-17 DIAGNOSIS — N2581 Secondary hyperparathyroidism of renal origin: Secondary | ICD-10-CM | POA: Diagnosis not present

## 2016-12-17 DIAGNOSIS — N186 End stage renal disease: Secondary | ICD-10-CM | POA: Diagnosis not present

## 2016-12-17 DIAGNOSIS — Z992 Dependence on renal dialysis: Secondary | ICD-10-CM | POA: Diagnosis not present

## 2016-12-17 DIAGNOSIS — Z23 Encounter for immunization: Secondary | ICD-10-CM | POA: Diagnosis not present

## 2016-12-19 DIAGNOSIS — Z992 Dependence on renal dialysis: Secondary | ICD-10-CM | POA: Diagnosis not present

## 2016-12-19 DIAGNOSIS — Z23 Encounter for immunization: Secondary | ICD-10-CM | POA: Diagnosis not present

## 2016-12-19 DIAGNOSIS — N2581 Secondary hyperparathyroidism of renal origin: Secondary | ICD-10-CM | POA: Diagnosis not present

## 2016-12-19 DIAGNOSIS — D509 Iron deficiency anemia, unspecified: Secondary | ICD-10-CM | POA: Diagnosis not present

## 2016-12-19 DIAGNOSIS — D631 Anemia in chronic kidney disease: Secondary | ICD-10-CM | POA: Diagnosis not present

## 2016-12-19 DIAGNOSIS — N186 End stage renal disease: Secondary | ICD-10-CM | POA: Diagnosis not present

## 2016-12-22 DIAGNOSIS — Z23 Encounter for immunization: Secondary | ICD-10-CM | POA: Diagnosis not present

## 2016-12-22 DIAGNOSIS — Z992 Dependence on renal dialysis: Secondary | ICD-10-CM | POA: Diagnosis not present

## 2016-12-22 DIAGNOSIS — D509 Iron deficiency anemia, unspecified: Secondary | ICD-10-CM | POA: Diagnosis not present

## 2016-12-22 DIAGNOSIS — D631 Anemia in chronic kidney disease: Secondary | ICD-10-CM | POA: Diagnosis not present

## 2016-12-22 DIAGNOSIS — N186 End stage renal disease: Secondary | ICD-10-CM | POA: Diagnosis not present

## 2016-12-22 DIAGNOSIS — N2581 Secondary hyperparathyroidism of renal origin: Secondary | ICD-10-CM | POA: Diagnosis not present

## 2016-12-24 DIAGNOSIS — N186 End stage renal disease: Secondary | ICD-10-CM | POA: Diagnosis not present

## 2016-12-24 DIAGNOSIS — D509 Iron deficiency anemia, unspecified: Secondary | ICD-10-CM | POA: Diagnosis not present

## 2016-12-24 DIAGNOSIS — N2581 Secondary hyperparathyroidism of renal origin: Secondary | ICD-10-CM | POA: Diagnosis not present

## 2016-12-24 DIAGNOSIS — D631 Anemia in chronic kidney disease: Secondary | ICD-10-CM | POA: Diagnosis not present

## 2016-12-24 DIAGNOSIS — Z992 Dependence on renal dialysis: Secondary | ICD-10-CM | POA: Diagnosis not present

## 2016-12-26 DIAGNOSIS — N2581 Secondary hyperparathyroidism of renal origin: Secondary | ICD-10-CM | POA: Diagnosis not present

## 2016-12-26 DIAGNOSIS — N186 End stage renal disease: Secondary | ICD-10-CM | POA: Diagnosis not present

## 2016-12-26 DIAGNOSIS — Z992 Dependence on renal dialysis: Secondary | ICD-10-CM | POA: Diagnosis not present

## 2016-12-26 DIAGNOSIS — D631 Anemia in chronic kidney disease: Secondary | ICD-10-CM | POA: Diagnosis not present

## 2016-12-26 DIAGNOSIS — D509 Iron deficiency anemia, unspecified: Secondary | ICD-10-CM | POA: Diagnosis not present

## 2016-12-29 DIAGNOSIS — N186 End stage renal disease: Secondary | ICD-10-CM | POA: Diagnosis not present

## 2016-12-29 DIAGNOSIS — D631 Anemia in chronic kidney disease: Secondary | ICD-10-CM | POA: Diagnosis not present

## 2016-12-29 DIAGNOSIS — N2581 Secondary hyperparathyroidism of renal origin: Secondary | ICD-10-CM | POA: Diagnosis not present

## 2016-12-29 DIAGNOSIS — Z992 Dependence on renal dialysis: Secondary | ICD-10-CM | POA: Diagnosis not present

## 2016-12-29 DIAGNOSIS — D509 Iron deficiency anemia, unspecified: Secondary | ICD-10-CM | POA: Diagnosis not present

## 2016-12-31 DIAGNOSIS — N186 End stage renal disease: Secondary | ICD-10-CM | POA: Diagnosis not present

## 2016-12-31 DIAGNOSIS — Z992 Dependence on renal dialysis: Secondary | ICD-10-CM | POA: Diagnosis not present

## 2016-12-31 DIAGNOSIS — D631 Anemia in chronic kidney disease: Secondary | ICD-10-CM | POA: Diagnosis not present

## 2016-12-31 DIAGNOSIS — N2581 Secondary hyperparathyroidism of renal origin: Secondary | ICD-10-CM | POA: Diagnosis not present

## 2016-12-31 DIAGNOSIS — D509 Iron deficiency anemia, unspecified: Secondary | ICD-10-CM | POA: Diagnosis not present

## 2017-01-02 DIAGNOSIS — Z992 Dependence on renal dialysis: Secondary | ICD-10-CM | POA: Diagnosis not present

## 2017-01-02 DIAGNOSIS — D509 Iron deficiency anemia, unspecified: Secondary | ICD-10-CM | POA: Diagnosis not present

## 2017-01-02 DIAGNOSIS — N2581 Secondary hyperparathyroidism of renal origin: Secondary | ICD-10-CM | POA: Diagnosis not present

## 2017-01-02 DIAGNOSIS — D631 Anemia in chronic kidney disease: Secondary | ICD-10-CM | POA: Diagnosis not present

## 2017-01-02 DIAGNOSIS — N186 End stage renal disease: Secondary | ICD-10-CM | POA: Diagnosis not present

## 2017-01-05 DIAGNOSIS — D631 Anemia in chronic kidney disease: Secondary | ICD-10-CM | POA: Diagnosis not present

## 2017-01-05 DIAGNOSIS — D509 Iron deficiency anemia, unspecified: Secondary | ICD-10-CM | POA: Diagnosis not present

## 2017-01-05 DIAGNOSIS — Z992 Dependence on renal dialysis: Secondary | ICD-10-CM | POA: Diagnosis not present

## 2017-01-05 DIAGNOSIS — N186 End stage renal disease: Secondary | ICD-10-CM | POA: Diagnosis not present

## 2017-01-05 DIAGNOSIS — N2581 Secondary hyperparathyroidism of renal origin: Secondary | ICD-10-CM | POA: Diagnosis not present

## 2017-01-07 DIAGNOSIS — Z992 Dependence on renal dialysis: Secondary | ICD-10-CM | POA: Diagnosis not present

## 2017-01-07 DIAGNOSIS — N2581 Secondary hyperparathyroidism of renal origin: Secondary | ICD-10-CM | POA: Diagnosis not present

## 2017-01-07 DIAGNOSIS — N186 End stage renal disease: Secondary | ICD-10-CM | POA: Diagnosis not present

## 2017-01-07 DIAGNOSIS — D509 Iron deficiency anemia, unspecified: Secondary | ICD-10-CM | POA: Diagnosis not present

## 2017-01-07 DIAGNOSIS — D631 Anemia in chronic kidney disease: Secondary | ICD-10-CM | POA: Diagnosis not present

## 2017-01-09 DIAGNOSIS — Z992 Dependence on renal dialysis: Secondary | ICD-10-CM | POA: Diagnosis not present

## 2017-01-09 DIAGNOSIS — D509 Iron deficiency anemia, unspecified: Secondary | ICD-10-CM | POA: Diagnosis not present

## 2017-01-09 DIAGNOSIS — N186 End stage renal disease: Secondary | ICD-10-CM | POA: Diagnosis not present

## 2017-01-09 DIAGNOSIS — D631 Anemia in chronic kidney disease: Secondary | ICD-10-CM | POA: Diagnosis not present

## 2017-01-09 DIAGNOSIS — N2581 Secondary hyperparathyroidism of renal origin: Secondary | ICD-10-CM | POA: Diagnosis not present

## 2017-01-12 DIAGNOSIS — D509 Iron deficiency anemia, unspecified: Secondary | ICD-10-CM | POA: Diagnosis not present

## 2017-01-12 DIAGNOSIS — Z992 Dependence on renal dialysis: Secondary | ICD-10-CM | POA: Diagnosis not present

## 2017-01-12 DIAGNOSIS — N2581 Secondary hyperparathyroidism of renal origin: Secondary | ICD-10-CM | POA: Diagnosis not present

## 2017-01-12 DIAGNOSIS — N186 End stage renal disease: Secondary | ICD-10-CM | POA: Diagnosis not present

## 2017-01-12 DIAGNOSIS — D631 Anemia in chronic kidney disease: Secondary | ICD-10-CM | POA: Diagnosis not present

## 2017-01-14 DIAGNOSIS — N186 End stage renal disease: Secondary | ICD-10-CM | POA: Diagnosis not present

## 2017-01-14 DIAGNOSIS — D631 Anemia in chronic kidney disease: Secondary | ICD-10-CM | POA: Diagnosis not present

## 2017-01-14 DIAGNOSIS — Z992 Dependence on renal dialysis: Secondary | ICD-10-CM | POA: Diagnosis not present

## 2017-01-14 DIAGNOSIS — D509 Iron deficiency anemia, unspecified: Secondary | ICD-10-CM | POA: Diagnosis not present

## 2017-01-14 DIAGNOSIS — N2581 Secondary hyperparathyroidism of renal origin: Secondary | ICD-10-CM | POA: Diagnosis not present

## 2017-01-16 DIAGNOSIS — N2581 Secondary hyperparathyroidism of renal origin: Secondary | ICD-10-CM | POA: Diagnosis not present

## 2017-01-16 DIAGNOSIS — D631 Anemia in chronic kidney disease: Secondary | ICD-10-CM | POA: Diagnosis not present

## 2017-01-16 DIAGNOSIS — D509 Iron deficiency anemia, unspecified: Secondary | ICD-10-CM | POA: Diagnosis not present

## 2017-01-16 DIAGNOSIS — N186 End stage renal disease: Secondary | ICD-10-CM | POA: Diagnosis not present

## 2017-01-16 DIAGNOSIS — Z992 Dependence on renal dialysis: Secondary | ICD-10-CM | POA: Diagnosis not present

## 2017-01-19 DIAGNOSIS — D509 Iron deficiency anemia, unspecified: Secondary | ICD-10-CM | POA: Diagnosis not present

## 2017-01-19 DIAGNOSIS — N2581 Secondary hyperparathyroidism of renal origin: Secondary | ICD-10-CM | POA: Diagnosis not present

## 2017-01-19 DIAGNOSIS — N186 End stage renal disease: Secondary | ICD-10-CM | POA: Diagnosis not present

## 2017-01-19 DIAGNOSIS — Z992 Dependence on renal dialysis: Secondary | ICD-10-CM | POA: Diagnosis not present

## 2017-01-19 DIAGNOSIS — D631 Anemia in chronic kidney disease: Secondary | ICD-10-CM | POA: Diagnosis not present

## 2017-01-21 DIAGNOSIS — D509 Iron deficiency anemia, unspecified: Secondary | ICD-10-CM | POA: Diagnosis not present

## 2017-01-21 DIAGNOSIS — D631 Anemia in chronic kidney disease: Secondary | ICD-10-CM | POA: Diagnosis not present

## 2017-01-21 DIAGNOSIS — N2581 Secondary hyperparathyroidism of renal origin: Secondary | ICD-10-CM | POA: Diagnosis not present

## 2017-01-21 DIAGNOSIS — Z992 Dependence on renal dialysis: Secondary | ICD-10-CM | POA: Diagnosis not present

## 2017-01-21 DIAGNOSIS — N186 End stage renal disease: Secondary | ICD-10-CM | POA: Diagnosis not present

## 2017-01-22 DIAGNOSIS — Z992 Dependence on renal dialysis: Secondary | ICD-10-CM | POA: Diagnosis not present

## 2017-01-22 DIAGNOSIS — N186 End stage renal disease: Secondary | ICD-10-CM | POA: Diagnosis not present

## 2017-01-23 DIAGNOSIS — D509 Iron deficiency anemia, unspecified: Secondary | ICD-10-CM | POA: Diagnosis not present

## 2017-01-23 DIAGNOSIS — Z992 Dependence on renal dialysis: Secondary | ICD-10-CM | POA: Diagnosis not present

## 2017-01-23 DIAGNOSIS — N186 End stage renal disease: Secondary | ICD-10-CM | POA: Diagnosis not present

## 2017-01-23 DIAGNOSIS — D631 Anemia in chronic kidney disease: Secondary | ICD-10-CM | POA: Diagnosis not present

## 2017-01-23 DIAGNOSIS — N2581 Secondary hyperparathyroidism of renal origin: Secondary | ICD-10-CM | POA: Diagnosis not present

## 2017-01-26 DIAGNOSIS — N186 End stage renal disease: Secondary | ICD-10-CM | POA: Diagnosis not present

## 2017-01-26 DIAGNOSIS — D631 Anemia in chronic kidney disease: Secondary | ICD-10-CM | POA: Diagnosis not present

## 2017-01-26 DIAGNOSIS — N2581 Secondary hyperparathyroidism of renal origin: Secondary | ICD-10-CM | POA: Diagnosis not present

## 2017-01-26 DIAGNOSIS — Z992 Dependence on renal dialysis: Secondary | ICD-10-CM | POA: Diagnosis not present

## 2017-01-26 DIAGNOSIS — D509 Iron deficiency anemia, unspecified: Secondary | ICD-10-CM | POA: Diagnosis not present

## 2017-01-28 DIAGNOSIS — Z992 Dependence on renal dialysis: Secondary | ICD-10-CM | POA: Diagnosis not present

## 2017-01-28 DIAGNOSIS — N2581 Secondary hyperparathyroidism of renal origin: Secondary | ICD-10-CM | POA: Diagnosis not present

## 2017-01-28 DIAGNOSIS — N186 End stage renal disease: Secondary | ICD-10-CM | POA: Diagnosis not present

## 2017-01-28 DIAGNOSIS — D631 Anemia in chronic kidney disease: Secondary | ICD-10-CM | POA: Diagnosis not present

## 2017-01-28 DIAGNOSIS — D509 Iron deficiency anemia, unspecified: Secondary | ICD-10-CM | POA: Diagnosis not present

## 2017-01-30 DIAGNOSIS — D509 Iron deficiency anemia, unspecified: Secondary | ICD-10-CM | POA: Diagnosis not present

## 2017-01-30 DIAGNOSIS — N186 End stage renal disease: Secondary | ICD-10-CM | POA: Diagnosis not present

## 2017-01-30 DIAGNOSIS — Z992 Dependence on renal dialysis: Secondary | ICD-10-CM | POA: Diagnosis not present

## 2017-01-30 DIAGNOSIS — N2581 Secondary hyperparathyroidism of renal origin: Secondary | ICD-10-CM | POA: Diagnosis not present

## 2017-01-30 DIAGNOSIS — D631 Anemia in chronic kidney disease: Secondary | ICD-10-CM | POA: Diagnosis not present

## 2017-02-02 DIAGNOSIS — N2581 Secondary hyperparathyroidism of renal origin: Secondary | ICD-10-CM | POA: Diagnosis not present

## 2017-02-02 DIAGNOSIS — D509 Iron deficiency anemia, unspecified: Secondary | ICD-10-CM | POA: Diagnosis not present

## 2017-02-02 DIAGNOSIS — N186 End stage renal disease: Secondary | ICD-10-CM | POA: Diagnosis not present

## 2017-02-02 DIAGNOSIS — Z992 Dependence on renal dialysis: Secondary | ICD-10-CM | POA: Diagnosis not present

## 2017-02-02 DIAGNOSIS — D631 Anemia in chronic kidney disease: Secondary | ICD-10-CM | POA: Diagnosis not present

## 2017-02-03 ENCOUNTER — Ambulatory Visit (INDEPENDENT_AMBULATORY_CARE_PROVIDER_SITE_OTHER): Payer: Medicare HMO | Admitting: Internal Medicine

## 2017-02-04 DIAGNOSIS — N186 End stage renal disease: Secondary | ICD-10-CM | POA: Diagnosis not present

## 2017-02-04 DIAGNOSIS — D509 Iron deficiency anemia, unspecified: Secondary | ICD-10-CM | POA: Diagnosis not present

## 2017-02-04 DIAGNOSIS — Z992 Dependence on renal dialysis: Secondary | ICD-10-CM | POA: Diagnosis not present

## 2017-02-04 DIAGNOSIS — D631 Anemia in chronic kidney disease: Secondary | ICD-10-CM | POA: Diagnosis not present

## 2017-02-04 DIAGNOSIS — N2581 Secondary hyperparathyroidism of renal origin: Secondary | ICD-10-CM | POA: Diagnosis not present

## 2017-02-06 DIAGNOSIS — D509 Iron deficiency anemia, unspecified: Secondary | ICD-10-CM | POA: Diagnosis not present

## 2017-02-06 DIAGNOSIS — Z992 Dependence on renal dialysis: Secondary | ICD-10-CM | POA: Diagnosis not present

## 2017-02-06 DIAGNOSIS — N2581 Secondary hyperparathyroidism of renal origin: Secondary | ICD-10-CM | POA: Diagnosis not present

## 2017-02-06 DIAGNOSIS — N186 End stage renal disease: Secondary | ICD-10-CM | POA: Diagnosis not present

## 2017-02-06 DIAGNOSIS — D631 Anemia in chronic kidney disease: Secondary | ICD-10-CM | POA: Diagnosis not present

## 2017-02-09 DIAGNOSIS — D509 Iron deficiency anemia, unspecified: Secondary | ICD-10-CM | POA: Diagnosis not present

## 2017-02-09 DIAGNOSIS — E119 Type 2 diabetes mellitus without complications: Secondary | ICD-10-CM | POA: Diagnosis not present

## 2017-02-09 DIAGNOSIS — Z992 Dependence on renal dialysis: Secondary | ICD-10-CM | POA: Diagnosis not present

## 2017-02-09 DIAGNOSIS — N2581 Secondary hyperparathyroidism of renal origin: Secondary | ICD-10-CM | POA: Diagnosis not present

## 2017-02-09 DIAGNOSIS — N186 End stage renal disease: Secondary | ICD-10-CM | POA: Diagnosis not present

## 2017-02-09 DIAGNOSIS — D631 Anemia in chronic kidney disease: Secondary | ICD-10-CM | POA: Diagnosis not present

## 2017-02-10 ENCOUNTER — Encounter (INDEPENDENT_AMBULATORY_CARE_PROVIDER_SITE_OTHER): Payer: Self-pay | Admitting: Internal Medicine

## 2017-02-10 ENCOUNTER — Ambulatory Visit (INDEPENDENT_AMBULATORY_CARE_PROVIDER_SITE_OTHER): Payer: Medicare Other | Admitting: Internal Medicine

## 2017-02-10 VITALS — BP 160/60 | HR 60 | Temp 98.6°F | Ht 60.0 in | Wt 78.3 lb

## 2017-02-10 DIAGNOSIS — N186 End stage renal disease: Secondary | ICD-10-CM

## 2017-02-10 DIAGNOSIS — Z992 Dependence on renal dialysis: Secondary | ICD-10-CM | POA: Diagnosis not present

## 2017-02-10 DIAGNOSIS — R110 Nausea: Secondary | ICD-10-CM

## 2017-02-10 HISTORY — DX: End stage renal disease: N18.6

## 2017-02-10 LAB — CBC WITH DIFFERENTIAL/PLATELET
BASOS ABS: 50 {cells}/uL (ref 0–200)
Basophils Relative: 1.1 %
EOS PCT: 1.6 %
Eosinophils Absolute: 72 cells/uL (ref 15–500)
HCT: 35 % (ref 35.0–45.0)
HEMOGLOBIN: 11.4 g/dL — AB (ref 11.7–15.5)
Lymphs Abs: 864 cells/uL (ref 850–3900)
MCH: 30.2 pg (ref 27.0–33.0)
MCHC: 32.6 g/dL (ref 32.0–36.0)
MCV: 92.6 fL (ref 80.0–100.0)
MONOS PCT: 6.7 %
MPV: 11 fL (ref 7.5–12.5)
NEUTROS ABS: 3213 {cells}/uL (ref 1500–7800)
Neutrophils Relative %: 71.4 %
PLATELETS: 195 10*3/uL (ref 140–400)
RBC: 3.78 10*6/uL — ABNORMAL LOW (ref 3.80–5.10)
RDW: 13.6 % (ref 11.0–15.0)
Total Lymphocyte: 19.2 %
WBC mixed population: 302 cells/uL (ref 200–950)
WBC: 4.5 10*3/uL (ref 3.8–10.8)

## 2017-02-10 LAB — COMPREHENSIVE METABOLIC PANEL
AG RATIO: 1.3 (calc) (ref 1.0–2.5)
ALT: 6 U/L (ref 6–29)
AST: 15 U/L (ref 10–35)
Albumin: 3.1 g/dL — ABNORMAL LOW (ref 3.6–5.1)
Alkaline phosphatase (APISO): 56 U/L (ref 33–130)
BUN / CREAT RATIO: 7 (calc) (ref 6–22)
BUN: 23 mg/dL (ref 7–25)
CO2: 29 mmol/L (ref 20–32)
CREATININE: 3.15 mg/dL — AB (ref 0.60–0.93)
Calcium: 8.6 mg/dL (ref 8.6–10.4)
Chloride: 100 mmol/L (ref 98–110)
GLUCOSE: 159 mg/dL — AB (ref 65–139)
Globulin: 2.4 g/dL (calc) (ref 1.9–3.7)
Potassium: 4.4 mmol/L (ref 3.5–5.3)
SODIUM: 139 mmol/L (ref 135–146)
TOTAL PROTEIN: 5.5 g/dL — AB (ref 6.1–8.1)
Total Bilirubin: 0.5 mg/dL (ref 0.2–1.2)

## 2017-02-10 MED ORDER — OMEPRAZOLE 40 MG PO CPDR: 40.0000 mg | DELAYED_RELEASE_CAPSULE | Freq: Every day | ORAL | 3 refills | Status: DC

## 2017-02-10 NOTE — Progress Notes (Addendum)
Subjective:    Patient ID: Courtney [name], female    DOB: 1940-03-24, 76 y.o.   MRN: [id number] PCP Dr. Sherrie Sport HPI Referred by Dr.Befakadu for nausea.  After she has dialysis, she vomits. Sometimes at home she will vomit and sometimes she doesn't at mealtime.  Her appetite is fair.  She is eating 3 meals a day sometimes per son. Her son lives with her.  BMs are normal.  She is w/c bound.   Dialysis T-Thur-Sat at [facility] Hx of CVA, ESRD  Review of Systems Past Medical History:  Diagnosis Date  . Diabetes mellitus without complication ([place])   . ESRD on dialysis ([place]) 02/10/2017  . Hypertension   . Stroke [facility])    2-3 yrs ago- right sided weakness    Past Surgical History:  Procedure Laterality Date  . APPENDECTOMY    . AV FISTULA PLACEMENT Right 09/20/2015   Procedure: PLACEMENT OF RIGHT UPPER EXTREMITY ARTERIOVENOUS GORE-TEX GRAFT FOR HEMODIALYSIS ACCESS;  Surgeon: Vickie Epley, MD;  Location: [facility];  Service: Vascular;  Laterality: Right;  . CATARACT EXTRACTION W/PHACO Left 06/29/2013   Procedure: CATARACT EXTRACTION PHACO AND INTRAOCULAR LENS PLACEMENT (IOC);  Surgeon: Tonny Branch, MD;  Location: [facility];  Service: Ophthalmology;  Laterality: Left;  CDE 12.25  . CATARACT EXTRACTION W/PHACO Right 07/27/2013   Procedure: CATARACT EXTRACTION PHACO AND INTRAOCULAR LENS PLACEMENT (IOC);  Surgeon: Tonny Branch, MD;  Location: [facility];  Service: Ophthalmology;  Laterality: Right;  CDE:7.72  . INSERTION OF DIALYSIS CATHETER Right 09/11/2015   Procedure: INSERTION OF TUNNELED DIALYSIS CATHETER;  Surgeon: Vickie Epley, MD;  Location: [facility];  Service: Vascular;  Laterality: Right;  . INTRAMEDULLARY (IM) NAIL INTERTROCHANTERIC Right 07/20/2012   Procedure: INTRAMEDULLARY (IM) NAIL INTERTROCHANTRIC;  Surgeon: Carole Civil, MD;  Location: [facility];  Service: Orthopedics;  Laterality: Right;  . IR GENERIC HISTORICAL  09/25/2015   IR US GUIDE VASC ACCESS RIGHT 09/25/2015 Sandi Mariscal, MD [facility]    . IR GENERIC HISTORICAL  09/25/2015   IR FLUORO GUIDE CV LINE RIGHT 09/25/2015 Sandi Mariscal, MD [facility]  . IR GENERIC HISTORICAL  09/25/2015   IR REMOVAL TUN CV CATH W/O FL 09/25/2015 Sandi Mariscal, MD [facility]    Allergies  Allergen Reactions  . Grapefruit Flavor [Flavoring Agent]     Reaction unknown    Current Outpatient Medications on File Prior to Visit  Medication Sig Dispense Refill  . acetaminophen (TYLENOL) 325 MG tablet Take 650 mg by mouth every 6 (six) hours as needed.    Marland Kitchen aspirin EC 81 MG tablet Take 81 mg by mouth daily.    Marland Kitchen atorvastatin (LIPITOR) 20 MG tablet Take 20 mg by mouth daily.    . calcium-vitamin D (OSCAL WITH D) 500-200 MG-UNIT tablet Take 1 tablet by mouth.    . carvedilol (COREG) 6.25 MG tablet Take 6.25 mg by mouth 2 (two) times daily with a meal.    . cholecalciferol (VITAMIN D) 400 units TABS tablet Take 1,000 Units by mouth.    . escitalopram (LEXAPRO) 5 MG tablet Take 5 mg by mouth daily.    . furosemide (LASIX) 20 MG tablet Take 20 mg by mouth.    . hydrALAZINE (APRESOLINE) 25 MG tablet Take 25 mg by mouth 3 (three) times daily.    . K Phos Mono-Sod Phos Di & Mono (PHOSPHA 250 NEUTRAL) [phone] MG TABS Take by mouth daily.    . Multiple Vitamin (MULTIVITAMIN WITH MINERALS) TABS tablet  Take 1 tablet by mouth daily.    . sodium bicarbonate 650 MG tablet Take 1,300 mg by mouth 2 (two) times daily.     . isosorbide mononitrate (IMDUR) 30 MG 24 hr tablet Take 30 mg by mouth daily.    Marland Kitchen loperamide (IMODIUM A-D) 2 MG tablet Take 2 mg by mouth every 6 (six) hours as needed for diarrhea or loose stools.    Marland Kitchen oxyCODONE-acetaminophen (ROXICET) 5-325 MG tablet Take 1 tablet by mouth every 6 (six) hours as needed for severe pain. (Patient not taking: Reported on 02/10/2017) 30 tablet 0   No current facility-administered medications on file prior to visit.         Objective:   Physical Exam Blood pressure (!) 160/60, pulse 60, temperature 98.6 F (37 C),  height 5' (1.524 m), weight 78 lb 4.8 oz (35.5 kg). Alert and oriented. Skin warm and dry. Oral mucosa is moist.   . Sclera anicteric, conjunctivae is pink. Thyroid not enlarged. No cervical lymphadenopathy. Lungs clear. Heart regular rate and rhythm.  Abdomen is soft. Bowel sounds are positive. No hepatomegaly. No abdominal masses felt. No tenderness.  No edema to lower extremities.          Assessment & Plan:  Chronic nausea, especially after dialysis.  Rx for Omeprazole 40mg  to her pharmacy. CBC, Cmet OV in 8 weeks.

## 2017-02-10 NOTE — Progress Notes (Signed)
Patient ID: Courtney [name], female   DOB: [birth year], 76 y.o.   MRN: [id number]

## 2017-02-10 NOTE — Patient Instructions (Signed)
Labs today. OV in 8 weeks.

## 2017-02-11 DIAGNOSIS — D631 Anemia in chronic kidney disease: Secondary | ICD-10-CM | POA: Diagnosis not present

## 2017-02-11 DIAGNOSIS — Z992 Dependence on renal dialysis: Secondary | ICD-10-CM | POA: Diagnosis not present

## 2017-02-11 DIAGNOSIS — N186 End stage renal disease: Secondary | ICD-10-CM | POA: Diagnosis not present

## 2017-02-11 DIAGNOSIS — N2581 Secondary hyperparathyroidism of renal origin: Secondary | ICD-10-CM | POA: Diagnosis not present

## 2017-02-11 DIAGNOSIS — D509 Iron deficiency anemia, unspecified: Secondary | ICD-10-CM | POA: Diagnosis not present

## 2017-02-13 DIAGNOSIS — D631 Anemia in chronic kidney disease: Secondary | ICD-10-CM | POA: Diagnosis not present

## 2017-02-13 DIAGNOSIS — N186 End stage renal disease: Secondary | ICD-10-CM | POA: Diagnosis not present

## 2017-02-13 DIAGNOSIS — D509 Iron deficiency anemia, unspecified: Secondary | ICD-10-CM | POA: Diagnosis not present

## 2017-02-13 DIAGNOSIS — Z992 Dependence on renal dialysis: Secondary | ICD-10-CM | POA: Diagnosis not present

## 2017-02-13 DIAGNOSIS — N2581 Secondary hyperparathyroidism of renal origin: Secondary | ICD-10-CM | POA: Diagnosis not present

## 2017-02-15 DIAGNOSIS — D631 Anemia in chronic kidney disease: Secondary | ICD-10-CM | POA: Diagnosis not present

## 2017-02-15 DIAGNOSIS — D509 Iron deficiency anemia, unspecified: Secondary | ICD-10-CM | POA: Diagnosis not present

## 2017-02-15 DIAGNOSIS — N2581 Secondary hyperparathyroidism of renal origin: Secondary | ICD-10-CM | POA: Diagnosis not present

## 2017-02-15 DIAGNOSIS — Z992 Dependence on renal dialysis: Secondary | ICD-10-CM | POA: Diagnosis not present

## 2017-02-15 DIAGNOSIS — N186 End stage renal disease: Secondary | ICD-10-CM | POA: Diagnosis not present

## 2017-02-18 DIAGNOSIS — Z992 Dependence on renal dialysis: Secondary | ICD-10-CM | POA: Diagnosis not present

## 2017-02-18 DIAGNOSIS — N186 End stage renal disease: Secondary | ICD-10-CM | POA: Diagnosis not present

## 2017-02-18 DIAGNOSIS — N2581 Secondary hyperparathyroidism of renal origin: Secondary | ICD-10-CM | POA: Diagnosis not present

## 2017-02-18 DIAGNOSIS — D509 Iron deficiency anemia, unspecified: Secondary | ICD-10-CM | POA: Diagnosis not present

## 2017-02-18 DIAGNOSIS — D631 Anemia in chronic kidney disease: Secondary | ICD-10-CM | POA: Diagnosis not present

## 2017-02-20 DIAGNOSIS — D509 Iron deficiency anemia, unspecified: Secondary | ICD-10-CM | POA: Diagnosis not present

## 2017-02-20 DIAGNOSIS — D631 Anemia in chronic kidney disease: Secondary | ICD-10-CM | POA: Diagnosis not present

## 2017-02-20 DIAGNOSIS — N186 End stage renal disease: Secondary | ICD-10-CM | POA: Diagnosis not present

## 2017-02-20 DIAGNOSIS — Z992 Dependence on renal dialysis: Secondary | ICD-10-CM | POA: Diagnosis not present

## 2017-02-20 DIAGNOSIS — N2581 Secondary hyperparathyroidism of renal origin: Secondary | ICD-10-CM | POA: Diagnosis not present

## 2017-02-22 DIAGNOSIS — D631 Anemia in chronic kidney disease: Secondary | ICD-10-CM | POA: Diagnosis not present

## 2017-02-22 DIAGNOSIS — N2581 Secondary hyperparathyroidism of renal origin: Secondary | ICD-10-CM | POA: Diagnosis not present

## 2017-02-22 DIAGNOSIS — D509 Iron deficiency anemia, unspecified: Secondary | ICD-10-CM | POA: Diagnosis not present

## 2017-02-22 DIAGNOSIS — Z992 Dependence on renal dialysis: Secondary | ICD-10-CM | POA: Diagnosis not present

## 2017-02-22 DIAGNOSIS — N186 End stage renal disease: Secondary | ICD-10-CM | POA: Diagnosis not present

## 2017-02-25 DIAGNOSIS — N186 End stage renal disease: Secondary | ICD-10-CM | POA: Diagnosis not present

## 2017-02-25 DIAGNOSIS — D631 Anemia in chronic kidney disease: Secondary | ICD-10-CM | POA: Diagnosis not present

## 2017-02-25 DIAGNOSIS — D509 Iron deficiency anemia, unspecified: Secondary | ICD-10-CM | POA: Diagnosis not present

## 2017-02-25 DIAGNOSIS — N2581 Secondary hyperparathyroidism of renal origin: Secondary | ICD-10-CM | POA: Diagnosis not present

## 2017-02-25 DIAGNOSIS — Z992 Dependence on renal dialysis: Secondary | ICD-10-CM | POA: Diagnosis not present

## 2017-02-27 DIAGNOSIS — N2581 Secondary hyperparathyroidism of renal origin: Secondary | ICD-10-CM | POA: Diagnosis not present

## 2017-02-27 DIAGNOSIS — N186 End stage renal disease: Secondary | ICD-10-CM | POA: Diagnosis not present

## 2017-02-27 DIAGNOSIS — Z992 Dependence on renal dialysis: Secondary | ICD-10-CM | POA: Diagnosis not present

## 2017-02-27 DIAGNOSIS — D631 Anemia in chronic kidney disease: Secondary | ICD-10-CM | POA: Diagnosis not present

## 2017-02-27 DIAGNOSIS — D509 Iron deficiency anemia, unspecified: Secondary | ICD-10-CM | POA: Diagnosis not present

## 2017-03-02 DIAGNOSIS — Z992 Dependence on renal dialysis: Secondary | ICD-10-CM | POA: Diagnosis not present

## 2017-03-02 DIAGNOSIS — N2581 Secondary hyperparathyroidism of renal origin: Secondary | ICD-10-CM | POA: Diagnosis not present

## 2017-03-02 DIAGNOSIS — D509 Iron deficiency anemia, unspecified: Secondary | ICD-10-CM | POA: Diagnosis not present

## 2017-03-02 DIAGNOSIS — D631 Anemia in chronic kidney disease: Secondary | ICD-10-CM | POA: Diagnosis not present

## 2017-03-02 DIAGNOSIS — N186 End stage renal disease: Secondary | ICD-10-CM | POA: Diagnosis not present

## 2017-03-04 DIAGNOSIS — N186 End stage renal disease: Secondary | ICD-10-CM | POA: Diagnosis not present

## 2017-03-04 DIAGNOSIS — D631 Anemia in chronic kidney disease: Secondary | ICD-10-CM | POA: Diagnosis not present

## 2017-03-04 DIAGNOSIS — N2581 Secondary hyperparathyroidism of renal origin: Secondary | ICD-10-CM | POA: Diagnosis not present

## 2017-03-04 DIAGNOSIS — Z992 Dependence on renal dialysis: Secondary | ICD-10-CM | POA: Diagnosis not present

## 2017-03-04 DIAGNOSIS — D509 Iron deficiency anemia, unspecified: Secondary | ICD-10-CM | POA: Diagnosis not present

## 2017-03-06 DIAGNOSIS — D509 Iron deficiency anemia, unspecified: Secondary | ICD-10-CM | POA: Diagnosis not present

## 2017-03-06 DIAGNOSIS — D631 Anemia in chronic kidney disease: Secondary | ICD-10-CM | POA: Diagnosis not present

## 2017-03-06 DIAGNOSIS — N186 End stage renal disease: Secondary | ICD-10-CM | POA: Diagnosis not present

## 2017-03-06 DIAGNOSIS — N2581 Secondary hyperparathyroidism of renal origin: Secondary | ICD-10-CM | POA: Diagnosis not present

## 2017-03-06 DIAGNOSIS — Z992 Dependence on renal dialysis: Secondary | ICD-10-CM | POA: Diagnosis not present

## 2017-03-09 DIAGNOSIS — D509 Iron deficiency anemia, unspecified: Secondary | ICD-10-CM | POA: Diagnosis not present

## 2017-03-09 DIAGNOSIS — N2581 Secondary hyperparathyroidism of renal origin: Secondary | ICD-10-CM | POA: Diagnosis not present

## 2017-03-09 DIAGNOSIS — N186 End stage renal disease: Secondary | ICD-10-CM | POA: Diagnosis not present

## 2017-03-09 DIAGNOSIS — D631 Anemia in chronic kidney disease: Secondary | ICD-10-CM | POA: Diagnosis not present

## 2017-03-09 DIAGNOSIS — Z992 Dependence on renal dialysis: Secondary | ICD-10-CM | POA: Diagnosis not present

## 2017-03-11 DIAGNOSIS — Z992 Dependence on renal dialysis: Secondary | ICD-10-CM | POA: Diagnosis not present

## 2017-03-11 DIAGNOSIS — D509 Iron deficiency anemia, unspecified: Secondary | ICD-10-CM | POA: Diagnosis not present

## 2017-03-11 DIAGNOSIS — N2581 Secondary hyperparathyroidism of renal origin: Secondary | ICD-10-CM | POA: Diagnosis not present

## 2017-03-11 DIAGNOSIS — D631 Anemia in chronic kidney disease: Secondary | ICD-10-CM | POA: Diagnosis not present

## 2017-03-11 DIAGNOSIS — N186 End stage renal disease: Secondary | ICD-10-CM | POA: Diagnosis not present

## 2017-03-13 DIAGNOSIS — D631 Anemia in chronic kidney disease: Secondary | ICD-10-CM | POA: Diagnosis not present

## 2017-03-13 DIAGNOSIS — N186 End stage renal disease: Secondary | ICD-10-CM | POA: Diagnosis not present

## 2017-03-13 DIAGNOSIS — Z992 Dependence on renal dialysis: Secondary | ICD-10-CM | POA: Diagnosis not present

## 2017-03-13 DIAGNOSIS — N2581 Secondary hyperparathyroidism of renal origin: Secondary | ICD-10-CM | POA: Diagnosis not present

## 2017-03-13 DIAGNOSIS — D509 Iron deficiency anemia, unspecified: Secondary | ICD-10-CM | POA: Diagnosis not present

## 2017-03-16 DIAGNOSIS — N186 End stage renal disease: Secondary | ICD-10-CM | POA: Diagnosis not present

## 2017-03-16 DIAGNOSIS — Z992 Dependence on renal dialysis: Secondary | ICD-10-CM | POA: Diagnosis not present

## 2017-03-16 DIAGNOSIS — D631 Anemia in chronic kidney disease: Secondary | ICD-10-CM | POA: Diagnosis not present

## 2017-03-16 DIAGNOSIS — N2581 Secondary hyperparathyroidism of renal origin: Secondary | ICD-10-CM | POA: Diagnosis not present

## 2017-03-16 DIAGNOSIS — D509 Iron deficiency anemia, unspecified: Secondary | ICD-10-CM | POA: Diagnosis not present

## 2017-03-18 DIAGNOSIS — Z992 Dependence on renal dialysis: Secondary | ICD-10-CM | POA: Diagnosis not present

## 2017-03-18 DIAGNOSIS — N186 End stage renal disease: Secondary | ICD-10-CM | POA: Diagnosis not present

## 2017-03-18 DIAGNOSIS — D631 Anemia in chronic kidney disease: Secondary | ICD-10-CM | POA: Diagnosis not present

## 2017-03-18 DIAGNOSIS — D509 Iron deficiency anemia, unspecified: Secondary | ICD-10-CM | POA: Diagnosis not present

## 2017-03-18 DIAGNOSIS — N2581 Secondary hyperparathyroidism of renal origin: Secondary | ICD-10-CM | POA: Diagnosis not present

## 2017-03-20 DIAGNOSIS — D509 Iron deficiency anemia, unspecified: Secondary | ICD-10-CM | POA: Diagnosis not present

## 2017-03-20 DIAGNOSIS — D631 Anemia in chronic kidney disease: Secondary | ICD-10-CM | POA: Diagnosis not present

## 2017-03-20 DIAGNOSIS — N2581 Secondary hyperparathyroidism of renal origin: Secondary | ICD-10-CM | POA: Diagnosis not present

## 2017-03-20 DIAGNOSIS — Z992 Dependence on renal dialysis: Secondary | ICD-10-CM | POA: Diagnosis not present

## 2017-03-20 DIAGNOSIS — N186 End stage renal disease: Secondary | ICD-10-CM | POA: Diagnosis not present

## 2017-03-23 DIAGNOSIS — N2581 Secondary hyperparathyroidism of renal origin: Secondary | ICD-10-CM | POA: Diagnosis not present

## 2017-03-23 DIAGNOSIS — Z992 Dependence on renal dialysis: Secondary | ICD-10-CM | POA: Diagnosis not present

## 2017-03-23 DIAGNOSIS — D509 Iron deficiency anemia, unspecified: Secondary | ICD-10-CM | POA: Diagnosis not present

## 2017-03-23 DIAGNOSIS — D631 Anemia in chronic kidney disease: Secondary | ICD-10-CM | POA: Diagnosis not present

## 2017-03-23 DIAGNOSIS — N186 End stage renal disease: Secondary | ICD-10-CM | POA: Diagnosis not present

## 2017-03-25 DIAGNOSIS — D509 Iron deficiency anemia, unspecified: Secondary | ICD-10-CM | POA: Diagnosis not present

## 2017-03-25 DIAGNOSIS — Z992 Dependence on renal dialysis: Secondary | ICD-10-CM | POA: Diagnosis not present

## 2017-03-25 DIAGNOSIS — N2581 Secondary hyperparathyroidism of renal origin: Secondary | ICD-10-CM | POA: Diagnosis not present

## 2017-03-25 DIAGNOSIS — N186 End stage renal disease: Secondary | ICD-10-CM | POA: Diagnosis not present

## 2017-03-25 DIAGNOSIS — D631 Anemia in chronic kidney disease: Secondary | ICD-10-CM | POA: Diagnosis not present

## 2017-03-27 ENCOUNTER — Inpatient Hospital Stay
Admission: AD | Admit: 2017-03-27 | Payer: Medicare Other | Source: Other Acute Inpatient Hospital | Admitting: Family Medicine

## 2017-03-27 DIAGNOSIS — I132 Hypertensive heart and chronic kidney disease with heart failure and with stage 5 chronic kidney disease, or end stage renal disease: Secondary | ICD-10-CM | POA: Diagnosis present

## 2017-03-27 DIAGNOSIS — E7800 Pure hypercholesterolemia, unspecified: Secondary | ICD-10-CM | POA: Diagnosis not present

## 2017-03-27 DIAGNOSIS — Z8673 Personal history of transient ischemic attack (TIA), and cerebral infarction without residual deficits: Secondary | ICD-10-CM | POA: Diagnosis not present

## 2017-03-27 DIAGNOSIS — Z95828 Presence of other vascular implants and grafts: Secondary | ICD-10-CM | POA: Diagnosis not present

## 2017-03-27 DIAGNOSIS — I120 Hypertensive chronic kidney disease with stage 5 chronic kidney disease or end stage renal disease: Secondary | ICD-10-CM | POA: Diagnosis not present

## 2017-03-27 DIAGNOSIS — R9431 Abnormal electrocardiogram [ECG] [EKG]: Secondary | ICD-10-CM | POA: Diagnosis not present

## 2017-03-27 DIAGNOSIS — I252 Old myocardial infarction: Secondary | ICD-10-CM | POA: Diagnosis not present

## 2017-03-27 DIAGNOSIS — J181 Lobar pneumonia, unspecified organism: Secondary | ICD-10-CM | POA: Diagnosis not present

## 2017-03-27 DIAGNOSIS — Z9981 Dependence on supplemental oxygen: Secondary | ICD-10-CM | POA: Diagnosis not present

## 2017-03-27 DIAGNOSIS — E1122 Type 2 diabetes mellitus with diabetic chronic kidney disease: Secondary | ICD-10-CM | POA: Diagnosis not present

## 2017-03-27 DIAGNOSIS — I509 Heart failure, unspecified: Secondary | ICD-10-CM | POA: Diagnosis not present

## 2017-03-27 DIAGNOSIS — J9601 Acute respiratory failure with hypoxia: Secondary | ICD-10-CM | POA: Diagnosis not present

## 2017-03-27 DIAGNOSIS — R05 Cough: Secondary | ICD-10-CM | POA: Diagnosis not present

## 2017-03-27 DIAGNOSIS — E8770 Fluid overload, unspecified: Secondary | ICD-10-CM | POA: Diagnosis not present

## 2017-03-27 DIAGNOSIS — Z79899 Other long term (current) drug therapy: Secondary | ICD-10-CM | POA: Diagnosis not present

## 2017-03-27 DIAGNOSIS — I501 Left ventricular failure: Secondary | ICD-10-CM | POA: Diagnosis not present

## 2017-03-27 DIAGNOSIS — Z66 Do not resuscitate: Secondary | ICD-10-CM | POA: Diagnosis not present

## 2017-03-27 DIAGNOSIS — Z681 Body mass index (BMI) 19 or less, adult: Secondary | ICD-10-CM | POA: Diagnosis not present

## 2017-03-27 DIAGNOSIS — J90 Pleural effusion, not elsewhere classified: Secondary | ICD-10-CM | POA: Diagnosis not present

## 2017-03-27 DIAGNOSIS — E785 Hyperlipidemia, unspecified: Secondary | ICD-10-CM | POA: Diagnosis not present

## 2017-03-27 DIAGNOSIS — J159 Unspecified bacterial pneumonia: Secondary | ICD-10-CM | POA: Diagnosis present

## 2017-03-27 DIAGNOSIS — Z7982 Long term (current) use of aspirin: Secondary | ICD-10-CM | POA: Diagnosis not present

## 2017-03-27 DIAGNOSIS — Z992 Dependence on renal dialysis: Secondary | ICD-10-CM | POA: Diagnosis not present

## 2017-03-27 DIAGNOSIS — D61818 Other pancytopenia: Secondary | ICD-10-CM | POA: Diagnosis not present

## 2017-03-27 DIAGNOSIS — J189 Pneumonia, unspecified organism: Secondary | ICD-10-CM | POA: Diagnosis not present

## 2017-03-27 DIAGNOSIS — E440 Moderate protein-calorie malnutrition: Secondary | ICD-10-CM | POA: Diagnosis present

## 2017-03-27 DIAGNOSIS — N186 End stage renal disease: Secondary | ICD-10-CM | POA: Diagnosis not present

## 2017-03-31 DIAGNOSIS — E1121 Type 2 diabetes mellitus with diabetic nephropathy: Secondary | ICD-10-CM | POA: Diagnosis not present

## 2017-03-31 DIAGNOSIS — I10 Essential (primary) hypertension: Secondary | ICD-10-CM | POA: Diagnosis not present

## 2017-03-31 DIAGNOSIS — I2510 Atherosclerotic heart disease of native coronary artery without angina pectoris: Secondary | ICD-10-CM | POA: Diagnosis not present

## 2017-03-31 DIAGNOSIS — F3289 Other specified depressive episodes: Secondary | ICD-10-CM | POA: Diagnosis not present

## 2017-04-01 DIAGNOSIS — N2581 Secondary hyperparathyroidism of renal origin: Secondary | ICD-10-CM | POA: Diagnosis not present

## 2017-04-01 DIAGNOSIS — N186 End stage renal disease: Secondary | ICD-10-CM | POA: Diagnosis not present

## 2017-04-01 DIAGNOSIS — D509 Iron deficiency anemia, unspecified: Secondary | ICD-10-CM | POA: Diagnosis not present

## 2017-04-01 DIAGNOSIS — Z992 Dependence on renal dialysis: Secondary | ICD-10-CM | POA: Diagnosis not present

## 2017-04-01 DIAGNOSIS — D631 Anemia in chronic kidney disease: Secondary | ICD-10-CM | POA: Diagnosis not present

## 2017-04-03 DIAGNOSIS — N186 End stage renal disease: Secondary | ICD-10-CM | POA: Diagnosis not present

## 2017-04-03 DIAGNOSIS — N2581 Secondary hyperparathyroidism of renal origin: Secondary | ICD-10-CM | POA: Diagnosis not present

## 2017-04-03 DIAGNOSIS — D509 Iron deficiency anemia, unspecified: Secondary | ICD-10-CM | POA: Diagnosis not present

## 2017-04-03 DIAGNOSIS — Z992 Dependence on renal dialysis: Secondary | ICD-10-CM | POA: Diagnosis not present

## 2017-04-03 DIAGNOSIS — D631 Anemia in chronic kidney disease: Secondary | ICD-10-CM | POA: Diagnosis not present

## 2017-04-06 DIAGNOSIS — D631 Anemia in chronic kidney disease: Secondary | ICD-10-CM | POA: Diagnosis not present

## 2017-04-06 DIAGNOSIS — Z992 Dependence on renal dialysis: Secondary | ICD-10-CM | POA: Diagnosis not present

## 2017-04-06 DIAGNOSIS — N2581 Secondary hyperparathyroidism of renal origin: Secondary | ICD-10-CM | POA: Diagnosis not present

## 2017-04-06 DIAGNOSIS — N186 End stage renal disease: Secondary | ICD-10-CM | POA: Diagnosis not present

## 2017-04-06 DIAGNOSIS — D509 Iron deficiency anemia, unspecified: Secondary | ICD-10-CM | POA: Diagnosis not present

## 2017-04-07 ENCOUNTER — Ambulatory Visit (INDEPENDENT_AMBULATORY_CARE_PROVIDER_SITE_OTHER): Payer: Medicare Other | Admitting: Internal Medicine

## 2017-04-07 ENCOUNTER — Encounter (INDEPENDENT_AMBULATORY_CARE_PROVIDER_SITE_OTHER): Payer: Self-pay | Admitting: Internal Medicine

## 2017-04-07 VITALS — BP 140/62 | HR 56 | Temp 98.4°F | Ht 60.0 in

## 2017-04-07 DIAGNOSIS — R110 Nausea: Secondary | ICD-10-CM | POA: Diagnosis not present

## 2017-04-07 NOTE — Progress Notes (Signed)
   Subjective:    Patient ID: Courtney [name], female    DOB: May 04, 1940, 77 y.o.   MRN: [id number]  HPI Here today for f/u. Last seen in December for nausea. Labs were called to patient and she felt much better.  Recently in [facility] about a week ag. She was transferred to [facility] because they did not do dialysis there. She had pneumonia and a UTI. She was discharged from [facility] with an antibiotic ? Antibiotic.  She is doing fair now.  She does occasionally have some nausea. Her son describes as a very small amt. BMs are normal but a little loose. (Son is answering questions).  She says she eats as much as she wants. I did not obtain a weight today because she is w/c bound and unable to stand. .   She is w/c bound.   Dialysis T-Thur-Sat at [facility] Hx of CVA, ESRD   CBC    Component Value Date/Time   WBC 4.5 02/10/2017 1139   RBC 3.78 (L) 02/10/2017 1139   HGB 11.4 (L) 02/10/2017 1139   HCT 35.0 02/10/2017 1139   PLT 195 02/10/2017 1139   MCV 92.6 02/10/2017 1139   MCH 30.2 02/10/2017 1139   MCHC 32.6 02/10/2017 1139   RDW 13.6 02/10/2017 1139   LYMPHSABS 864 02/10/2017 1139   MONOABS 0.6 09/20/2015 0902   EOSABS 72 02/10/2017 1139   BASOSABS 50 02/10/2017 1139   Hepatic Function Latest Ref Rng & Units 02/10/2017 07/18/2012 07/17/2012  Total Protein 6.1 - 8.1 g/dL 5.5(L) 5.8(L) 7.2  Albumin 3.5 - 5.2 g/dL - 2.9(L) 3.4(L)  AST 10 - 35 U/L '15 17 22  '$ ALT 6 - 29 U/L '6 14 18  '$ Alk Phosphatase 39 - 117 U/L - 94 111  Total Bilirubin 0.2 - 1.2 mg/dL 0.5 0.6 0.6     Review of Systems     Objective:   Physical Exam Blood pressure 140/62, pulse (!) 56, temperature 98.4 F (36.9 C), height 5' (1.524 m). Alert and oriented. Skin warm and dry. Oral mucosa is moist.   . Sclera anicteric, conjunctivae is pink. Thyroid not enlarged. No cervical lymphadenopathy. Lungs clear. Heart regular rate and rhythm.  Abdomen is soft. Bowel sounds are positive. No hepatomegaly. No abdominal masses felt. No  tenderness.  No edema to lower extremities.             Assessment & Plan:   Nausea. She is better. Will continue to watch. OV in 6 months.

## 2017-04-08 DIAGNOSIS — N2581 Secondary hyperparathyroidism of renal origin: Secondary | ICD-10-CM | POA: Diagnosis not present

## 2017-04-08 DIAGNOSIS — Z992 Dependence on renal dialysis: Secondary | ICD-10-CM | POA: Diagnosis not present

## 2017-04-08 DIAGNOSIS — D631 Anemia in chronic kidney disease: Secondary | ICD-10-CM | POA: Diagnosis not present

## 2017-04-08 DIAGNOSIS — N186 End stage renal disease: Secondary | ICD-10-CM | POA: Diagnosis not present

## 2017-04-08 DIAGNOSIS — D509 Iron deficiency anemia, unspecified: Secondary | ICD-10-CM | POA: Diagnosis not present

## 2017-04-10 DIAGNOSIS — N2581 Secondary hyperparathyroidism of renal origin: Secondary | ICD-10-CM | POA: Diagnosis not present

## 2017-04-10 DIAGNOSIS — D509 Iron deficiency anemia, unspecified: Secondary | ICD-10-CM | POA: Diagnosis not present

## 2017-04-10 DIAGNOSIS — Z992 Dependence on renal dialysis: Secondary | ICD-10-CM | POA: Diagnosis not present

## 2017-04-10 DIAGNOSIS — N186 End stage renal disease: Secondary | ICD-10-CM | POA: Diagnosis not present

## 2017-04-10 DIAGNOSIS — D631 Anemia in chronic kidney disease: Secondary | ICD-10-CM | POA: Diagnosis not present

## 2017-04-13 DIAGNOSIS — D509 Iron deficiency anemia, unspecified: Secondary | ICD-10-CM | POA: Diagnosis not present

## 2017-04-13 DIAGNOSIS — Z992 Dependence on renal dialysis: Secondary | ICD-10-CM | POA: Diagnosis not present

## 2017-04-13 DIAGNOSIS — D631 Anemia in chronic kidney disease: Secondary | ICD-10-CM | POA: Diagnosis not present

## 2017-04-13 DIAGNOSIS — N2581 Secondary hyperparathyroidism of renal origin: Secondary | ICD-10-CM | POA: Diagnosis not present

## 2017-04-13 DIAGNOSIS — N186 End stage renal disease: Secondary | ICD-10-CM | POA: Diagnosis not present

## 2017-04-14 DIAGNOSIS — F329 Major depressive disorder, single episode, unspecified: Secondary | ICD-10-CM | POA: Diagnosis present

## 2017-04-14 DIAGNOSIS — I132 Hypertensive heart and chronic kidney disease with heart failure and with stage 5 chronic kidney disease, or end stage renal disease: Secondary | ICD-10-CM | POA: Diagnosis not present

## 2017-04-14 DIAGNOSIS — I252 Old myocardial infarction: Secondary | ICD-10-CM | POA: Diagnosis not present

## 2017-04-14 DIAGNOSIS — N186 End stage renal disease: Secondary | ICD-10-CM | POA: Diagnosis present

## 2017-04-14 DIAGNOSIS — J189 Pneumonia, unspecified organism: Secondary | ICD-10-CM | POA: Diagnosis not present

## 2017-04-14 DIAGNOSIS — E1129 Type 2 diabetes mellitus with other diabetic kidney complication: Secondary | ICD-10-CM | POA: Diagnosis not present

## 2017-04-14 DIAGNOSIS — E8770 Fluid overload, unspecified: Secondary | ICD-10-CM | POA: Diagnosis not present

## 2017-04-14 DIAGNOSIS — I69351 Hemiplegia and hemiparesis following cerebral infarction affecting right dominant side: Secondary | ICD-10-CM | POA: Diagnosis not present

## 2017-04-14 DIAGNOSIS — R109 Unspecified abdominal pain: Secondary | ICD-10-CM | POA: Diagnosis not present

## 2017-04-14 DIAGNOSIS — Z992 Dependence on renal dialysis: Secondary | ICD-10-CM | POA: Diagnosis not present

## 2017-04-14 DIAGNOSIS — J181 Lobar pneumonia, unspecified organism: Secondary | ICD-10-CM | POA: Diagnosis not present

## 2017-04-14 DIAGNOSIS — J168 Pneumonia due to other specified infectious organisms: Secondary | ICD-10-CM | POA: Diagnosis not present

## 2017-04-14 DIAGNOSIS — Z681 Body mass index (BMI) 19 or less, adult: Secondary | ICD-10-CM | POA: Diagnosis not present

## 2017-04-14 DIAGNOSIS — Z7982 Long term (current) use of aspirin: Secondary | ICD-10-CM | POA: Diagnosis not present

## 2017-04-14 DIAGNOSIS — Z79899 Other long term (current) drug therapy: Secondary | ICD-10-CM | POA: Diagnosis not present

## 2017-04-14 DIAGNOSIS — J9601 Acute respiratory failure with hypoxia: Secondary | ICD-10-CM | POA: Diagnosis present

## 2017-04-14 DIAGNOSIS — R0602 Shortness of breath: Secondary | ICD-10-CM | POA: Diagnosis not present

## 2017-04-14 DIAGNOSIS — E1122 Type 2 diabetes mellitus with diabetic chronic kidney disease: Secondary | ICD-10-CM | POA: Diagnosis present

## 2017-04-14 DIAGNOSIS — I509 Heart failure, unspecified: Secondary | ICD-10-CM | POA: Diagnosis present

## 2017-04-14 DIAGNOSIS — E440 Moderate protein-calorie malnutrition: Secondary | ICD-10-CM | POA: Diagnosis present

## 2017-04-14 DIAGNOSIS — Z86718 Personal history of other venous thrombosis and embolism: Secondary | ICD-10-CM | POA: Diagnosis not present

## 2017-04-14 DIAGNOSIS — Z780 Asymptomatic menopausal state: Secondary | ICD-10-CM | POA: Diagnosis not present

## 2017-04-14 DIAGNOSIS — I10 Essential (primary) hypertension: Secondary | ICD-10-CM | POA: Diagnosis not present

## 2017-04-17 DIAGNOSIS — S098XXA Other specified injuries of head, initial encounter: Secondary | ICD-10-CM | POA: Diagnosis not present

## 2017-04-17 DIAGNOSIS — I110 Hypertensive heart disease with heart failure: Secondary | ICD-10-CM | POA: Diagnosis not present

## 2017-04-17 DIAGNOSIS — Z992 Dependence on renal dialysis: Secondary | ICD-10-CM | POA: Diagnosis not present

## 2017-04-17 DIAGNOSIS — D631 Anemia in chronic kidney disease: Secondary | ICD-10-CM | POA: Diagnosis not present

## 2017-04-17 DIAGNOSIS — Z7982 Long term (current) use of aspirin: Secondary | ICD-10-CM | POA: Diagnosis not present

## 2017-04-17 DIAGNOSIS — E119 Type 2 diabetes mellitus without complications: Secondary | ICD-10-CM | POA: Diagnosis not present

## 2017-04-17 DIAGNOSIS — I252 Old myocardial infarction: Secondary | ICD-10-CM | POA: Diagnosis not present

## 2017-04-17 DIAGNOSIS — N2581 Secondary hyperparathyroidism of renal origin: Secondary | ICD-10-CM | POA: Diagnosis not present

## 2017-04-17 DIAGNOSIS — Z79899 Other long term (current) drug therapy: Secondary | ICD-10-CM | POA: Diagnosis not present

## 2017-04-17 DIAGNOSIS — I509 Heart failure, unspecified: Secondary | ICD-10-CM | POA: Diagnosis not present

## 2017-04-17 DIAGNOSIS — R51 Headache: Secondary | ICD-10-CM | POA: Diagnosis not present

## 2017-04-17 DIAGNOSIS — Z8673 Personal history of transient ischemic attack (TIA), and cerebral infarction without residual deficits: Secondary | ICD-10-CM | POA: Diagnosis not present

## 2017-04-17 DIAGNOSIS — S0990XA Unspecified injury of head, initial encounter: Secondary | ICD-10-CM | POA: Diagnosis not present

## 2017-04-17 DIAGNOSIS — D509 Iron deficiency anemia, unspecified: Secondary | ICD-10-CM | POA: Diagnosis not present

## 2017-04-17 DIAGNOSIS — N186 End stage renal disease: Secondary | ICD-10-CM | POA: Diagnosis not present

## 2017-04-17 DIAGNOSIS — S0083XA Contusion of other part of head, initial encounter: Secondary | ICD-10-CM | POA: Diagnosis not present

## 2017-04-17 DIAGNOSIS — G4489 Other headache syndrome: Secondary | ICD-10-CM | POA: Diagnosis not present

## 2017-04-20 DIAGNOSIS — N186 End stage renal disease: Secondary | ICD-10-CM | POA: Diagnosis not present

## 2017-04-20 DIAGNOSIS — D631 Anemia in chronic kidney disease: Secondary | ICD-10-CM | POA: Diagnosis not present

## 2017-04-20 DIAGNOSIS — N2581 Secondary hyperparathyroidism of renal origin: Secondary | ICD-10-CM | POA: Diagnosis not present

## 2017-04-20 DIAGNOSIS — Z992 Dependence on renal dialysis: Secondary | ICD-10-CM | POA: Diagnosis not present

## 2017-04-20 DIAGNOSIS — D509 Iron deficiency anemia, unspecified: Secondary | ICD-10-CM | POA: Diagnosis not present

## 2017-04-22 DIAGNOSIS — D509 Iron deficiency anemia, unspecified: Secondary | ICD-10-CM | POA: Diagnosis not present

## 2017-04-22 DIAGNOSIS — Z992 Dependence on renal dialysis: Secondary | ICD-10-CM | POA: Diagnosis not present

## 2017-04-22 DIAGNOSIS — N2581 Secondary hyperparathyroidism of renal origin: Secondary | ICD-10-CM | POA: Diagnosis not present

## 2017-04-22 DIAGNOSIS — D631 Anemia in chronic kidney disease: Secondary | ICD-10-CM | POA: Diagnosis not present

## 2017-04-22 DIAGNOSIS — N186 End stage renal disease: Secondary | ICD-10-CM | POA: Diagnosis not present

## 2017-04-24 DIAGNOSIS — D631 Anemia in chronic kidney disease: Secondary | ICD-10-CM | POA: Diagnosis not present

## 2017-04-24 DIAGNOSIS — N2581 Secondary hyperparathyroidism of renal origin: Secondary | ICD-10-CM | POA: Diagnosis not present

## 2017-04-24 DIAGNOSIS — N186 End stage renal disease: Secondary | ICD-10-CM | POA: Diagnosis not present

## 2017-04-24 DIAGNOSIS — D509 Iron deficiency anemia, unspecified: Secondary | ICD-10-CM | POA: Diagnosis not present

## 2017-04-24 DIAGNOSIS — Z992 Dependence on renal dialysis: Secondary | ICD-10-CM | POA: Diagnosis not present

## 2017-04-27 DIAGNOSIS — N2581 Secondary hyperparathyroidism of renal origin: Secondary | ICD-10-CM | POA: Diagnosis not present

## 2017-04-27 DIAGNOSIS — N186 End stage renal disease: Secondary | ICD-10-CM | POA: Diagnosis not present

## 2017-04-27 DIAGNOSIS — D509 Iron deficiency anemia, unspecified: Secondary | ICD-10-CM | POA: Diagnosis not present

## 2017-04-27 DIAGNOSIS — D631 Anemia in chronic kidney disease: Secondary | ICD-10-CM | POA: Diagnosis not present

## 2017-04-27 DIAGNOSIS — Z992 Dependence on renal dialysis: Secondary | ICD-10-CM | POA: Diagnosis not present

## 2017-04-28 DIAGNOSIS — E1121 Type 2 diabetes mellitus with diabetic nephropathy: Secondary | ICD-10-CM | POA: Diagnosis not present

## 2017-04-28 DIAGNOSIS — I10 Essential (primary) hypertension: Secondary | ICD-10-CM | POA: Diagnosis not present

## 2017-04-28 DIAGNOSIS — I5032 Chronic diastolic (congestive) heart failure: Secondary | ICD-10-CM | POA: Diagnosis not present

## 2017-04-29 DIAGNOSIS — D509 Iron deficiency anemia, unspecified: Secondary | ICD-10-CM | POA: Diagnosis not present

## 2017-04-29 DIAGNOSIS — N186 End stage renal disease: Secondary | ICD-10-CM | POA: Diagnosis not present

## 2017-04-29 DIAGNOSIS — D631 Anemia in chronic kidney disease: Secondary | ICD-10-CM | POA: Diagnosis not present

## 2017-04-29 DIAGNOSIS — N2581 Secondary hyperparathyroidism of renal origin: Secondary | ICD-10-CM | POA: Diagnosis not present

## 2017-04-29 DIAGNOSIS — Z992 Dependence on renal dialysis: Secondary | ICD-10-CM | POA: Diagnosis not present

## 2017-05-01 DIAGNOSIS — N2581 Secondary hyperparathyroidism of renal origin: Secondary | ICD-10-CM | POA: Diagnosis not present

## 2017-05-01 DIAGNOSIS — D509 Iron deficiency anemia, unspecified: Secondary | ICD-10-CM | POA: Diagnosis not present

## 2017-05-01 DIAGNOSIS — N186 End stage renal disease: Secondary | ICD-10-CM | POA: Diagnosis not present

## 2017-05-01 DIAGNOSIS — D631 Anemia in chronic kidney disease: Secondary | ICD-10-CM | POA: Diagnosis not present

## 2017-05-01 DIAGNOSIS — Z992 Dependence on renal dialysis: Secondary | ICD-10-CM | POA: Diagnosis not present

## 2017-05-04 DIAGNOSIS — Z992 Dependence on renal dialysis: Secondary | ICD-10-CM | POA: Diagnosis not present

## 2017-05-04 DIAGNOSIS — N2581 Secondary hyperparathyroidism of renal origin: Secondary | ICD-10-CM | POA: Diagnosis not present

## 2017-05-04 DIAGNOSIS — D509 Iron deficiency anemia, unspecified: Secondary | ICD-10-CM | POA: Diagnosis not present

## 2017-05-04 DIAGNOSIS — D631 Anemia in chronic kidney disease: Secondary | ICD-10-CM | POA: Diagnosis not present

## 2017-05-04 DIAGNOSIS — N186 End stage renal disease: Secondary | ICD-10-CM | POA: Diagnosis not present

## 2017-05-06 DIAGNOSIS — D631 Anemia in chronic kidney disease: Secondary | ICD-10-CM | POA: Diagnosis not present

## 2017-05-06 DIAGNOSIS — Z992 Dependence on renal dialysis: Secondary | ICD-10-CM | POA: Diagnosis not present

## 2017-05-06 DIAGNOSIS — N2581 Secondary hyperparathyroidism of renal origin: Secondary | ICD-10-CM | POA: Diagnosis not present

## 2017-05-06 DIAGNOSIS — N186 End stage renal disease: Secondary | ICD-10-CM | POA: Diagnosis not present

## 2017-05-06 DIAGNOSIS — D509 Iron deficiency anemia, unspecified: Secondary | ICD-10-CM | POA: Diagnosis not present

## 2017-05-08 DIAGNOSIS — D631 Anemia in chronic kidney disease: Secondary | ICD-10-CM | POA: Diagnosis not present

## 2017-05-08 DIAGNOSIS — Z992 Dependence on renal dialysis: Secondary | ICD-10-CM | POA: Diagnosis not present

## 2017-05-08 DIAGNOSIS — D509 Iron deficiency anemia, unspecified: Secondary | ICD-10-CM | POA: Diagnosis not present

## 2017-05-08 DIAGNOSIS — N2581 Secondary hyperparathyroidism of renal origin: Secondary | ICD-10-CM | POA: Diagnosis not present

## 2017-05-08 DIAGNOSIS — N186 End stage renal disease: Secondary | ICD-10-CM | POA: Diagnosis not present

## 2017-05-11 DIAGNOSIS — D631 Anemia in chronic kidney disease: Secondary | ICD-10-CM | POA: Diagnosis not present

## 2017-05-11 DIAGNOSIS — N186 End stage renal disease: Secondary | ICD-10-CM | POA: Diagnosis not present

## 2017-05-11 DIAGNOSIS — Z992 Dependence on renal dialysis: Secondary | ICD-10-CM | POA: Diagnosis not present

## 2017-05-11 DIAGNOSIS — D509 Iron deficiency anemia, unspecified: Secondary | ICD-10-CM | POA: Diagnosis not present

## 2017-05-11 DIAGNOSIS — N2581 Secondary hyperparathyroidism of renal origin: Secondary | ICD-10-CM | POA: Diagnosis not present

## 2017-05-13 DIAGNOSIS — N2581 Secondary hyperparathyroidism of renal origin: Secondary | ICD-10-CM | POA: Diagnosis not present

## 2017-05-13 DIAGNOSIS — Z992 Dependence on renal dialysis: Secondary | ICD-10-CM | POA: Diagnosis not present

## 2017-05-13 DIAGNOSIS — D631 Anemia in chronic kidney disease: Secondary | ICD-10-CM | POA: Diagnosis not present

## 2017-05-13 DIAGNOSIS — D509 Iron deficiency anemia, unspecified: Secondary | ICD-10-CM | POA: Diagnosis not present

## 2017-05-13 DIAGNOSIS — N186 End stage renal disease: Secondary | ICD-10-CM | POA: Diagnosis not present

## 2017-05-15 DIAGNOSIS — N2581 Secondary hyperparathyroidism of renal origin: Secondary | ICD-10-CM | POA: Diagnosis not present

## 2017-05-15 DIAGNOSIS — N186 End stage renal disease: Secondary | ICD-10-CM | POA: Diagnosis not present

## 2017-05-15 DIAGNOSIS — D631 Anemia in chronic kidney disease: Secondary | ICD-10-CM | POA: Diagnosis not present

## 2017-05-15 DIAGNOSIS — D509 Iron deficiency anemia, unspecified: Secondary | ICD-10-CM | POA: Diagnosis not present

## 2017-05-15 DIAGNOSIS — Z992 Dependence on renal dialysis: Secondary | ICD-10-CM | POA: Diagnosis not present

## 2017-05-18 DIAGNOSIS — N186 End stage renal disease: Secondary | ICD-10-CM | POA: Diagnosis not present

## 2017-05-18 DIAGNOSIS — Z992 Dependence on renal dialysis: Secondary | ICD-10-CM | POA: Diagnosis not present

## 2017-05-18 DIAGNOSIS — D631 Anemia in chronic kidney disease: Secondary | ICD-10-CM | POA: Diagnosis not present

## 2017-05-18 DIAGNOSIS — N2581 Secondary hyperparathyroidism of renal origin: Secondary | ICD-10-CM | POA: Diagnosis not present

## 2017-05-18 DIAGNOSIS — D509 Iron deficiency anemia, unspecified: Secondary | ICD-10-CM | POA: Diagnosis not present

## 2017-05-18 DIAGNOSIS — E119 Type 2 diabetes mellitus without complications: Secondary | ICD-10-CM | POA: Diagnosis not present

## 2017-05-20 DIAGNOSIS — Z992 Dependence on renal dialysis: Secondary | ICD-10-CM | POA: Diagnosis not present

## 2017-05-20 DIAGNOSIS — D509 Iron deficiency anemia, unspecified: Secondary | ICD-10-CM | POA: Diagnosis not present

## 2017-05-20 DIAGNOSIS — D631 Anemia in chronic kidney disease: Secondary | ICD-10-CM | POA: Diagnosis not present

## 2017-05-20 DIAGNOSIS — N2581 Secondary hyperparathyroidism of renal origin: Secondary | ICD-10-CM | POA: Diagnosis not present

## 2017-05-20 DIAGNOSIS — N186 End stage renal disease: Secondary | ICD-10-CM | POA: Diagnosis not present

## 2017-05-22 DIAGNOSIS — D509 Iron deficiency anemia, unspecified: Secondary | ICD-10-CM | POA: Diagnosis not present

## 2017-05-22 DIAGNOSIS — D631 Anemia in chronic kidney disease: Secondary | ICD-10-CM | POA: Diagnosis not present

## 2017-05-22 DIAGNOSIS — Z992 Dependence on renal dialysis: Secondary | ICD-10-CM | POA: Diagnosis not present

## 2017-05-22 DIAGNOSIS — N186 End stage renal disease: Secondary | ICD-10-CM | POA: Diagnosis not present

## 2017-05-22 DIAGNOSIS — N2581 Secondary hyperparathyroidism of renal origin: Secondary | ICD-10-CM | POA: Diagnosis not present

## 2017-05-23 DIAGNOSIS — N186 End stage renal disease: Secondary | ICD-10-CM | POA: Diagnosis not present

## 2017-05-23 DIAGNOSIS — Z992 Dependence on renal dialysis: Secondary | ICD-10-CM | POA: Diagnosis not present

## 2017-05-25 DIAGNOSIS — I132 Hypertensive heart and chronic kidney disease with heart failure and with stage 5 chronic kidney disease, or end stage renal disease: Secondary | ICD-10-CM | POA: Diagnosis not present

## 2017-05-25 DIAGNOSIS — Z681 Body mass index (BMI) 19 or less, adult: Secondary | ICD-10-CM | POA: Diagnosis not present

## 2017-05-25 DIAGNOSIS — Z992 Dependence on renal dialysis: Secondary | ICD-10-CM | POA: Diagnosis not present

## 2017-05-25 DIAGNOSIS — R109 Unspecified abdominal pain: Secondary | ICD-10-CM | POA: Diagnosis not present

## 2017-05-25 DIAGNOSIS — E440 Moderate protein-calorie malnutrition: Secondary | ICD-10-CM | POA: Diagnosis not present

## 2017-05-25 DIAGNOSIS — N2581 Secondary hyperparathyroidism of renal origin: Secondary | ICD-10-CM | POA: Diagnosis not present

## 2017-05-25 DIAGNOSIS — D509 Iron deficiency anemia, unspecified: Secondary | ICD-10-CM | POA: Diagnosis not present

## 2017-05-25 DIAGNOSIS — N186 End stage renal disease: Secondary | ICD-10-CM | POA: Diagnosis not present

## 2017-05-25 DIAGNOSIS — I509 Heart failure, unspecified: Secondary | ICD-10-CM | POA: Diagnosis not present

## 2017-05-25 DIAGNOSIS — K859 Acute pancreatitis without necrosis or infection, unspecified: Secondary | ICD-10-CM | POA: Diagnosis not present

## 2017-05-25 DIAGNOSIS — J90 Pleural effusion, not elsewhere classified: Secondary | ICD-10-CM | POA: Diagnosis not present

## 2017-05-25 DIAGNOSIS — I5032 Chronic diastolic (congestive) heart failure: Secondary | ICD-10-CM | POA: Diagnosis not present

## 2017-05-25 DIAGNOSIS — D631 Anemia in chronic kidney disease: Secondary | ICD-10-CM | POA: Diagnosis not present

## 2017-05-26 DIAGNOSIS — E1122 Type 2 diabetes mellitus with diabetic chronic kidney disease: Secondary | ICD-10-CM | POA: Diagnosis present

## 2017-05-26 DIAGNOSIS — Z992 Dependence on renal dialysis: Secondary | ICD-10-CM | POA: Diagnosis not present

## 2017-05-26 DIAGNOSIS — Z823 Family history of stroke: Secondary | ICD-10-CM | POA: Diagnosis not present

## 2017-05-26 DIAGNOSIS — F329 Major depressive disorder, single episode, unspecified: Secondary | ICD-10-CM | POA: Diagnosis present

## 2017-05-26 DIAGNOSIS — Z8249 Family history of ischemic heart disease and other diseases of the circulatory system: Secondary | ICD-10-CM | POA: Diagnosis not present

## 2017-05-26 DIAGNOSIS — J90 Pleural effusion, not elsewhere classified: Secondary | ICD-10-CM | POA: Diagnosis not present

## 2017-05-26 DIAGNOSIS — Z780 Asymptomatic menopausal state: Secondary | ICD-10-CM | POA: Diagnosis not present

## 2017-05-26 DIAGNOSIS — N189 Chronic kidney disease, unspecified: Secondary | ICD-10-CM | POA: Diagnosis not present

## 2017-05-26 DIAGNOSIS — I252 Old myocardial infarction: Secondary | ICD-10-CM | POA: Diagnosis not present

## 2017-05-26 DIAGNOSIS — Z833 Family history of diabetes mellitus: Secondary | ICD-10-CM | POA: Diagnosis not present

## 2017-05-26 DIAGNOSIS — K8581 Other acute pancreatitis with uninfected necrosis: Secondary | ICD-10-CM | POA: Diagnosis not present

## 2017-05-26 DIAGNOSIS — I10 Essential (primary) hypertension: Secondary | ICD-10-CM | POA: Diagnosis not present

## 2017-05-26 DIAGNOSIS — I700 Atherosclerosis of aorta: Secondary | ICD-10-CM | POA: Diagnosis present

## 2017-05-26 DIAGNOSIS — K859 Acute pancreatitis without necrosis or infection, unspecified: Secondary | ICD-10-CM | POA: Diagnosis present

## 2017-05-26 DIAGNOSIS — Z681 Body mass index (BMI) 19 or less, adult: Secondary | ICD-10-CM | POA: Diagnosis not present

## 2017-05-26 DIAGNOSIS — Z86718 Personal history of other venous thrombosis and embolism: Secondary | ICD-10-CM | POA: Diagnosis not present

## 2017-05-26 DIAGNOSIS — Z79899 Other long term (current) drug therapy: Secondary | ICD-10-CM | POA: Diagnosis not present

## 2017-05-26 DIAGNOSIS — E1129 Type 2 diabetes mellitus with other diabetic kidney complication: Secondary | ICD-10-CM | POA: Diagnosis not present

## 2017-05-26 DIAGNOSIS — E7800 Pure hypercholesterolemia, unspecified: Secondary | ICD-10-CM | POA: Diagnosis present

## 2017-05-26 DIAGNOSIS — M898X9 Other specified disorders of bone, unspecified site: Secondary | ICD-10-CM | POA: Diagnosis present

## 2017-05-26 DIAGNOSIS — I69351 Hemiplegia and hemiparesis following cerebral infarction affecting right dominant side: Secondary | ICD-10-CM | POA: Diagnosis not present

## 2017-05-26 DIAGNOSIS — Z79891 Long term (current) use of opiate analgesic: Secondary | ICD-10-CM | POA: Diagnosis not present

## 2017-05-26 DIAGNOSIS — I132 Hypertensive heart and chronic kidney disease with heart failure and with stage 5 chronic kidney disease, or end stage renal disease: Secondary | ICD-10-CM | POA: Diagnosis not present

## 2017-05-26 DIAGNOSIS — I5032 Chronic diastolic (congestive) heart failure: Secondary | ICD-10-CM | POA: Diagnosis not present

## 2017-05-26 DIAGNOSIS — Z7982 Long term (current) use of aspirin: Secondary | ICD-10-CM | POA: Diagnosis not present

## 2017-05-26 DIAGNOSIS — E440 Moderate protein-calorie malnutrition: Secondary | ICD-10-CM | POA: Diagnosis not present

## 2017-05-26 DIAGNOSIS — N186 End stage renal disease: Secondary | ICD-10-CM | POA: Diagnosis not present

## 2017-05-29 DIAGNOSIS — N186 End stage renal disease: Secondary | ICD-10-CM | POA: Diagnosis not present

## 2017-05-29 DIAGNOSIS — Z992 Dependence on renal dialysis: Secondary | ICD-10-CM | POA: Diagnosis not present

## 2017-05-29 DIAGNOSIS — N2581 Secondary hyperparathyroidism of renal origin: Secondary | ICD-10-CM | POA: Diagnosis not present

## 2017-05-29 DIAGNOSIS — D631 Anemia in chronic kidney disease: Secondary | ICD-10-CM | POA: Diagnosis not present

## 2017-05-29 DIAGNOSIS — D509 Iron deficiency anemia, unspecified: Secondary | ICD-10-CM | POA: Diagnosis not present

## 2017-05-31 DIAGNOSIS — R05 Cough: Secondary | ICD-10-CM | POA: Diagnosis not present

## 2017-05-31 DIAGNOSIS — J189 Pneumonia, unspecified organism: Secondary | ICD-10-CM | POA: Diagnosis not present

## 2017-05-31 DIAGNOSIS — R109 Unspecified abdominal pain: Secondary | ICD-10-CM | POA: Diagnosis not present

## 2017-05-31 DIAGNOSIS — E7800 Pure hypercholesterolemia, unspecified: Secondary | ICD-10-CM | POA: Diagnosis present

## 2017-05-31 DIAGNOSIS — Z7982 Long term (current) use of aspirin: Secondary | ICD-10-CM | POA: Diagnosis not present

## 2017-05-31 DIAGNOSIS — Z8719 Personal history of other diseases of the digestive system: Secondary | ICD-10-CM | POA: Diagnosis not present

## 2017-05-31 DIAGNOSIS — N189 Chronic kidney disease, unspecified: Secondary | ICD-10-CM | POA: Diagnosis not present

## 2017-05-31 DIAGNOSIS — Z992 Dependence on renal dialysis: Secondary | ICD-10-CM | POA: Diagnosis not present

## 2017-05-31 DIAGNOSIS — I69351 Hemiplegia and hemiparesis following cerebral infarction affecting right dominant side: Secondary | ICD-10-CM | POA: Diagnosis not present

## 2017-05-31 DIAGNOSIS — Z86718 Personal history of other venous thrombosis and embolism: Secondary | ICD-10-CM | POA: Diagnosis not present

## 2017-05-31 DIAGNOSIS — E440 Moderate protein-calorie malnutrition: Secondary | ICD-10-CM | POA: Diagnosis not present

## 2017-05-31 DIAGNOSIS — I132 Hypertensive heart and chronic kidney disease with heart failure and with stage 5 chronic kidney disease, or end stage renal disease: Secondary | ICD-10-CM | POA: Diagnosis not present

## 2017-05-31 DIAGNOSIS — I252 Old myocardial infarction: Secondary | ICD-10-CM | POA: Diagnosis not present

## 2017-05-31 DIAGNOSIS — F329 Major depressive disorder, single episode, unspecified: Secondary | ICD-10-CM | POA: Diagnosis present

## 2017-05-31 DIAGNOSIS — I509 Heart failure, unspecified: Secondary | ICD-10-CM | POA: Diagnosis not present

## 2017-05-31 DIAGNOSIS — J1520 Pneumonia due to staphylococcus, unspecified: Secondary | ICD-10-CM | POA: Diagnosis present

## 2017-05-31 DIAGNOSIS — E1122 Type 2 diabetes mellitus with diabetic chronic kidney disease: Secondary | ICD-10-CM | POA: Diagnosis present

## 2017-05-31 DIAGNOSIS — I5032 Chronic diastolic (congestive) heart failure: Secondary | ICD-10-CM | POA: Diagnosis not present

## 2017-05-31 DIAGNOSIS — Z79899 Other long term (current) drug therapy: Secondary | ICD-10-CM | POA: Diagnosis not present

## 2017-05-31 DIAGNOSIS — R4182 Altered mental status, unspecified: Secondary | ICD-10-CM | POA: Diagnosis not present

## 2017-05-31 DIAGNOSIS — I700 Atherosclerosis of aorta: Secondary | ICD-10-CM | POA: Diagnosis present

## 2017-05-31 DIAGNOSIS — Z681 Body mass index (BMI) 19 or less, adult: Secondary | ICD-10-CM | POA: Diagnosis not present

## 2017-05-31 DIAGNOSIS — J156 Pneumonia due to other aerobic Gram-negative bacteria: Secondary | ICD-10-CM | POA: Diagnosis present

## 2017-05-31 DIAGNOSIS — D649 Anemia, unspecified: Secondary | ICD-10-CM | POA: Diagnosis present

## 2017-05-31 DIAGNOSIS — N186 End stage renal disease: Secondary | ICD-10-CM | POA: Diagnosis not present

## 2017-05-31 DIAGNOSIS — J154 Pneumonia due to other streptococci: Secondary | ICD-10-CM | POA: Diagnosis present

## 2017-05-31 DIAGNOSIS — J158 Pneumonia due to other specified bacteria: Secondary | ICD-10-CM | POA: Diagnosis not present

## 2017-06-05 DIAGNOSIS — D631 Anemia in chronic kidney disease: Secondary | ICD-10-CM | POA: Diagnosis not present

## 2017-06-05 DIAGNOSIS — N2581 Secondary hyperparathyroidism of renal origin: Secondary | ICD-10-CM | POA: Diagnosis not present

## 2017-06-05 DIAGNOSIS — D509 Iron deficiency anemia, unspecified: Secondary | ICD-10-CM | POA: Diagnosis not present

## 2017-06-05 DIAGNOSIS — Z992 Dependence on renal dialysis: Secondary | ICD-10-CM | POA: Diagnosis not present

## 2017-06-05 DIAGNOSIS — N186 End stage renal disease: Secondary | ICD-10-CM | POA: Diagnosis not present

## 2017-06-07 DIAGNOSIS — J168 Pneumonia due to other specified infectious organisms: Secondary | ICD-10-CM | POA: Diagnosis not present

## 2017-06-07 DIAGNOSIS — I5032 Chronic diastolic (congestive) heart failure: Secondary | ICD-10-CM | POA: Diagnosis not present

## 2017-06-08 DIAGNOSIS — D509 Iron deficiency anemia, unspecified: Secondary | ICD-10-CM | POA: Diagnosis not present

## 2017-06-08 DIAGNOSIS — Z992 Dependence on renal dialysis: Secondary | ICD-10-CM | POA: Diagnosis not present

## 2017-06-08 DIAGNOSIS — D631 Anemia in chronic kidney disease: Secondary | ICD-10-CM | POA: Diagnosis not present

## 2017-06-08 DIAGNOSIS — N2581 Secondary hyperparathyroidism of renal origin: Secondary | ICD-10-CM | POA: Diagnosis not present

## 2017-06-08 DIAGNOSIS — N186 End stage renal disease: Secondary | ICD-10-CM | POA: Diagnosis not present

## 2017-06-10 DIAGNOSIS — D631 Anemia in chronic kidney disease: Secondary | ICD-10-CM | POA: Diagnosis not present

## 2017-06-10 DIAGNOSIS — Z992 Dependence on renal dialysis: Secondary | ICD-10-CM | POA: Diagnosis not present

## 2017-06-10 DIAGNOSIS — D509 Iron deficiency anemia, unspecified: Secondary | ICD-10-CM | POA: Diagnosis not present

## 2017-06-10 DIAGNOSIS — N2581 Secondary hyperparathyroidism of renal origin: Secondary | ICD-10-CM | POA: Diagnosis not present

## 2017-06-10 DIAGNOSIS — N186 End stage renal disease: Secondary | ICD-10-CM | POA: Diagnosis not present

## 2017-06-12 DIAGNOSIS — D631 Anemia in chronic kidney disease: Secondary | ICD-10-CM | POA: Diagnosis not present

## 2017-06-12 DIAGNOSIS — Z992 Dependence on renal dialysis: Secondary | ICD-10-CM | POA: Diagnosis not present

## 2017-06-12 DIAGNOSIS — D509 Iron deficiency anemia, unspecified: Secondary | ICD-10-CM | POA: Diagnosis not present

## 2017-06-12 DIAGNOSIS — N2581 Secondary hyperparathyroidism of renal origin: Secondary | ICD-10-CM | POA: Diagnosis not present

## 2017-06-12 DIAGNOSIS — N186 End stage renal disease: Secondary | ICD-10-CM | POA: Diagnosis not present

## 2017-06-15 DIAGNOSIS — N186 End stage renal disease: Secondary | ICD-10-CM | POA: Diagnosis not present

## 2017-06-15 DIAGNOSIS — N2581 Secondary hyperparathyroidism of renal origin: Secondary | ICD-10-CM | POA: Diagnosis not present

## 2017-06-15 DIAGNOSIS — D509 Iron deficiency anemia, unspecified: Secondary | ICD-10-CM | POA: Diagnosis not present

## 2017-06-15 DIAGNOSIS — D631 Anemia in chronic kidney disease: Secondary | ICD-10-CM | POA: Diagnosis not present

## 2017-06-15 DIAGNOSIS — Z992 Dependence on renal dialysis: Secondary | ICD-10-CM | POA: Diagnosis not present

## 2017-06-17 DIAGNOSIS — D509 Iron deficiency anemia, unspecified: Secondary | ICD-10-CM | POA: Diagnosis not present

## 2017-06-17 DIAGNOSIS — N2581 Secondary hyperparathyroidism of renal origin: Secondary | ICD-10-CM | POA: Diagnosis not present

## 2017-06-17 DIAGNOSIS — D631 Anemia in chronic kidney disease: Secondary | ICD-10-CM | POA: Diagnosis not present

## 2017-06-17 DIAGNOSIS — Z992 Dependence on renal dialysis: Secondary | ICD-10-CM | POA: Diagnosis not present

## 2017-06-17 DIAGNOSIS — N186 End stage renal disease: Secondary | ICD-10-CM | POA: Diagnosis not present

## 2017-06-19 DIAGNOSIS — N2581 Secondary hyperparathyroidism of renal origin: Secondary | ICD-10-CM | POA: Diagnosis not present

## 2017-06-19 DIAGNOSIS — D509 Iron deficiency anemia, unspecified: Secondary | ICD-10-CM | POA: Diagnosis not present

## 2017-06-19 DIAGNOSIS — N186 End stage renal disease: Secondary | ICD-10-CM | POA: Diagnosis not present

## 2017-06-19 DIAGNOSIS — Z992 Dependence on renal dialysis: Secondary | ICD-10-CM | POA: Diagnosis not present

## 2017-06-19 DIAGNOSIS — D631 Anemia in chronic kidney disease: Secondary | ICD-10-CM | POA: Diagnosis not present

## 2017-06-22 DIAGNOSIS — D509 Iron deficiency anemia, unspecified: Secondary | ICD-10-CM | POA: Diagnosis not present

## 2017-06-22 DIAGNOSIS — N186 End stage renal disease: Secondary | ICD-10-CM | POA: Diagnosis not present

## 2017-06-22 DIAGNOSIS — D631 Anemia in chronic kidney disease: Secondary | ICD-10-CM | POA: Diagnosis not present

## 2017-06-22 DIAGNOSIS — Z992 Dependence on renal dialysis: Secondary | ICD-10-CM | POA: Diagnosis not present

## 2017-06-22 DIAGNOSIS — N2581 Secondary hyperparathyroidism of renal origin: Secondary | ICD-10-CM | POA: Diagnosis not present

## 2017-06-24 DIAGNOSIS — N2581 Secondary hyperparathyroidism of renal origin: Secondary | ICD-10-CM | POA: Diagnosis not present

## 2017-06-24 DIAGNOSIS — D631 Anemia in chronic kidney disease: Secondary | ICD-10-CM | POA: Diagnosis not present

## 2017-06-24 DIAGNOSIS — D509 Iron deficiency anemia, unspecified: Secondary | ICD-10-CM | POA: Diagnosis not present

## 2017-06-24 DIAGNOSIS — Z992 Dependence on renal dialysis: Secondary | ICD-10-CM | POA: Diagnosis not present

## 2017-06-24 DIAGNOSIS — N186 End stage renal disease: Secondary | ICD-10-CM | POA: Diagnosis not present

## 2017-06-26 DIAGNOSIS — D509 Iron deficiency anemia, unspecified: Secondary | ICD-10-CM | POA: Diagnosis not present

## 2017-06-26 DIAGNOSIS — Z992 Dependence on renal dialysis: Secondary | ICD-10-CM | POA: Diagnosis not present

## 2017-06-26 DIAGNOSIS — N2581 Secondary hyperparathyroidism of renal origin: Secondary | ICD-10-CM | POA: Diagnosis not present

## 2017-06-26 DIAGNOSIS — N186 End stage renal disease: Secondary | ICD-10-CM | POA: Diagnosis not present

## 2017-06-26 DIAGNOSIS — D631 Anemia in chronic kidney disease: Secondary | ICD-10-CM | POA: Diagnosis not present

## 2017-06-29 DIAGNOSIS — D631 Anemia in chronic kidney disease: Secondary | ICD-10-CM | POA: Diagnosis not present

## 2017-06-29 DIAGNOSIS — Z992 Dependence on renal dialysis: Secondary | ICD-10-CM | POA: Diagnosis not present

## 2017-06-29 DIAGNOSIS — D509 Iron deficiency anemia, unspecified: Secondary | ICD-10-CM | POA: Diagnosis not present

## 2017-06-29 DIAGNOSIS — N2581 Secondary hyperparathyroidism of renal origin: Secondary | ICD-10-CM | POA: Diagnosis not present

## 2017-06-29 DIAGNOSIS — N186 End stage renal disease: Secondary | ICD-10-CM | POA: Diagnosis not present

## 2017-07-01 DIAGNOSIS — N186 End stage renal disease: Secondary | ICD-10-CM | POA: Diagnosis not present

## 2017-07-01 DIAGNOSIS — Z992 Dependence on renal dialysis: Secondary | ICD-10-CM | POA: Diagnosis not present

## 2017-07-01 DIAGNOSIS — N2581 Secondary hyperparathyroidism of renal origin: Secondary | ICD-10-CM | POA: Diagnosis not present

## 2017-07-01 DIAGNOSIS — D631 Anemia in chronic kidney disease: Secondary | ICD-10-CM | POA: Diagnosis not present

## 2017-07-01 DIAGNOSIS — D509 Iron deficiency anemia, unspecified: Secondary | ICD-10-CM | POA: Diagnosis not present

## 2017-07-03 DIAGNOSIS — D631 Anemia in chronic kidney disease: Secondary | ICD-10-CM | POA: Diagnosis not present

## 2017-07-03 DIAGNOSIS — D509 Iron deficiency anemia, unspecified: Secondary | ICD-10-CM | POA: Diagnosis not present

## 2017-07-03 DIAGNOSIS — N186 End stage renal disease: Secondary | ICD-10-CM | POA: Diagnosis not present

## 2017-07-03 DIAGNOSIS — N2581 Secondary hyperparathyroidism of renal origin: Secondary | ICD-10-CM | POA: Diagnosis not present

## 2017-07-03 DIAGNOSIS — Z992 Dependence on renal dialysis: Secondary | ICD-10-CM | POA: Diagnosis not present

## 2017-07-06 DIAGNOSIS — D509 Iron deficiency anemia, unspecified: Secondary | ICD-10-CM | POA: Diagnosis not present

## 2017-07-06 DIAGNOSIS — N2581 Secondary hyperparathyroidism of renal origin: Secondary | ICD-10-CM | POA: Diagnosis not present

## 2017-07-06 DIAGNOSIS — Z992 Dependence on renal dialysis: Secondary | ICD-10-CM | POA: Diagnosis not present

## 2017-07-06 DIAGNOSIS — D631 Anemia in chronic kidney disease: Secondary | ICD-10-CM | POA: Diagnosis not present

## 2017-07-06 DIAGNOSIS — N186 End stage renal disease: Secondary | ICD-10-CM | POA: Diagnosis not present

## 2017-07-07 DIAGNOSIS — I5032 Chronic diastolic (congestive) heart failure: Secondary | ICD-10-CM | POA: Diagnosis not present

## 2017-07-07 DIAGNOSIS — Z7982 Long term (current) use of aspirin: Secondary | ICD-10-CM | POA: Diagnosis not present

## 2017-07-07 DIAGNOSIS — Z8673 Personal history of transient ischemic attack (TIA), and cerebral infarction without residual deficits: Secondary | ICD-10-CM | POA: Diagnosis not present

## 2017-07-07 DIAGNOSIS — I252 Old myocardial infarction: Secondary | ICD-10-CM | POA: Diagnosis not present

## 2017-07-07 DIAGNOSIS — L89812 Pressure ulcer of head, stage 2: Secondary | ICD-10-CM | POA: Diagnosis not present

## 2017-07-07 DIAGNOSIS — M79606 Pain in leg, unspecified: Secondary | ICD-10-CM | POA: Diagnosis not present

## 2017-07-07 DIAGNOSIS — E119 Type 2 diabetes mellitus without complications: Secondary | ICD-10-CM | POA: Diagnosis not present

## 2017-07-07 DIAGNOSIS — Z79899 Other long term (current) drug therapy: Secondary | ICD-10-CM | POA: Diagnosis not present

## 2017-07-07 DIAGNOSIS — E7849 Other hyperlipidemia: Secondary | ICD-10-CM | POA: Diagnosis not present

## 2017-07-07 DIAGNOSIS — M79604 Pain in right leg: Secondary | ICD-10-CM | POA: Diagnosis not present

## 2017-07-07 DIAGNOSIS — Z992 Dependence on renal dialysis: Secondary | ICD-10-CM | POA: Diagnosis not present

## 2017-07-07 DIAGNOSIS — I509 Heart failure, unspecified: Secondary | ICD-10-CM | POA: Diagnosis not present

## 2017-07-07 DIAGNOSIS — I110 Hypertensive heart disease with heart failure: Secondary | ICD-10-CM | POA: Diagnosis not present

## 2017-07-08 DIAGNOSIS — N186 End stage renal disease: Secondary | ICD-10-CM | POA: Diagnosis not present

## 2017-07-08 DIAGNOSIS — D509 Iron deficiency anemia, unspecified: Secondary | ICD-10-CM | POA: Diagnosis not present

## 2017-07-08 DIAGNOSIS — D631 Anemia in chronic kidney disease: Secondary | ICD-10-CM | POA: Diagnosis not present

## 2017-07-08 DIAGNOSIS — Z992 Dependence on renal dialysis: Secondary | ICD-10-CM | POA: Diagnosis not present

## 2017-07-08 DIAGNOSIS — N2581 Secondary hyperparathyroidism of renal origin: Secondary | ICD-10-CM | POA: Diagnosis not present

## 2017-07-09 DIAGNOSIS — F339 Major depressive disorder, recurrent, unspecified: Secondary | ICD-10-CM | POA: Diagnosis not present

## 2017-07-09 DIAGNOSIS — E7849 Other hyperlipidemia: Secondary | ICD-10-CM | POA: Diagnosis not present

## 2017-07-09 DIAGNOSIS — L89152 Pressure ulcer of sacral region, stage 2: Secondary | ICD-10-CM | POA: Diagnosis not present

## 2017-07-09 DIAGNOSIS — I5032 Chronic diastolic (congestive) heart failure: Secondary | ICD-10-CM | POA: Diagnosis not present

## 2017-07-09 DIAGNOSIS — I110 Hypertensive heart disease with heart failure: Secondary | ICD-10-CM | POA: Diagnosis not present

## 2017-07-10 DIAGNOSIS — D631 Anemia in chronic kidney disease: Secondary | ICD-10-CM | POA: Diagnosis not present

## 2017-07-10 DIAGNOSIS — Z992 Dependence on renal dialysis: Secondary | ICD-10-CM | POA: Diagnosis not present

## 2017-07-10 DIAGNOSIS — N186 End stage renal disease: Secondary | ICD-10-CM | POA: Diagnosis not present

## 2017-07-10 DIAGNOSIS — N2581 Secondary hyperparathyroidism of renal origin: Secondary | ICD-10-CM | POA: Diagnosis not present

## 2017-07-10 DIAGNOSIS — D509 Iron deficiency anemia, unspecified: Secondary | ICD-10-CM | POA: Diagnosis not present

## 2017-07-12 DIAGNOSIS — I110 Hypertensive heart disease with heart failure: Secondary | ICD-10-CM | POA: Diagnosis not present

## 2017-07-12 DIAGNOSIS — I5032 Chronic diastolic (congestive) heart failure: Secondary | ICD-10-CM | POA: Diagnosis not present

## 2017-07-12 DIAGNOSIS — F339 Major depressive disorder, recurrent, unspecified: Secondary | ICD-10-CM | POA: Diagnosis not present

## 2017-07-12 DIAGNOSIS — L89152 Pressure ulcer of sacral region, stage 2: Secondary | ICD-10-CM | POA: Diagnosis not present

## 2017-07-12 DIAGNOSIS — E7849 Other hyperlipidemia: Secondary | ICD-10-CM | POA: Diagnosis not present

## 2017-07-13 ENCOUNTER — Other Ambulatory Visit: Payer: Self-pay

## 2017-07-13 ENCOUNTER — Emergency Department (HOSPITAL_COMMUNITY): Payer: Medicare Other

## 2017-07-13 ENCOUNTER — Encounter (HOSPITAL_COMMUNITY): Payer: Self-pay | Admitting: Emergency Medicine

## 2017-07-13 ENCOUNTER — Inpatient Hospital Stay (HOSPITAL_COMMUNITY)
Admission: EM | Admit: 2017-07-13 | Discharge: 2017-07-14 | DRG: 064 | Disposition: A | Discharge status: Home / Self Care | Payer: Medicare Other | Attending: Internal Medicine | Admitting: Internal Medicine

## 2017-07-13 DIAGNOSIS — E876 Hypokalemia: Secondary | ICD-10-CM | POA: Diagnosis not present

## 2017-07-13 DIAGNOSIS — Z91018 Allergy to other foods: Secondary | ICD-10-CM

## 2017-07-13 DIAGNOSIS — I509 Heart failure, unspecified: Secondary | ICD-10-CM | POA: Diagnosis not present

## 2017-07-13 DIAGNOSIS — E119 Type 2 diabetes mellitus without complications: Secondary | ICD-10-CM

## 2017-07-13 DIAGNOSIS — R29810 Facial weakness: Secondary | ICD-10-CM | POA: Diagnosis not present

## 2017-07-13 DIAGNOSIS — Z992 Dependence on renal dialysis: Secondary | ICD-10-CM | POA: Diagnosis not present

## 2017-07-13 DIAGNOSIS — Z961 Presence of intraocular lens: Secondary | ICD-10-CM | POA: Diagnosis present

## 2017-07-13 DIAGNOSIS — I639 Cerebral infarction, unspecified: Secondary | ICD-10-CM | POA: Diagnosis present

## 2017-07-13 DIAGNOSIS — I4510 Unspecified right bundle-branch block: Secondary | ICD-10-CM | POA: Diagnosis present

## 2017-07-13 DIAGNOSIS — I120 Hypertensive chronic kidney disease with stage 5 chronic kidney disease or end stage renal disease: Secondary | ICD-10-CM | POA: Diagnosis not present

## 2017-07-13 DIAGNOSIS — I2510 Atherosclerotic heart disease of native coronary artery without angina pectoris: Secondary | ICD-10-CM | POA: Diagnosis present

## 2017-07-13 DIAGNOSIS — Z79899 Other long term (current) drug therapy: Secondary | ICD-10-CM

## 2017-07-13 DIAGNOSIS — Z79891 Long term (current) use of opiate analgesic: Secondary | ICD-10-CM

## 2017-07-13 DIAGNOSIS — E1122 Type 2 diabetes mellitus with diabetic chronic kidney disease: Secondary | ICD-10-CM | POA: Diagnosis not present

## 2017-07-13 DIAGNOSIS — I252 Old myocardial infarction: Secondary | ICD-10-CM

## 2017-07-13 DIAGNOSIS — I6523 Occlusion and stenosis of bilateral carotid arteries: Secondary | ICD-10-CM | POA: Diagnosis not present

## 2017-07-13 DIAGNOSIS — I132 Hypertensive heart and chronic kidney disease with heart failure and with stage 5 chronic kidney disease, or end stage renal disease: Secondary | ICD-10-CM | POA: Diagnosis present

## 2017-07-13 DIAGNOSIS — Z7982 Long term (current) use of aspirin: Secondary | ICD-10-CM | POA: Diagnosis not present

## 2017-07-13 DIAGNOSIS — D509 Iron deficiency anemia, unspecified: Secondary | ICD-10-CM | POA: Diagnosis not present

## 2017-07-13 DIAGNOSIS — E785 Hyperlipidemia, unspecified: Secondary | ICD-10-CM | POA: Diagnosis present

## 2017-07-13 DIAGNOSIS — E1151 Type 2 diabetes mellitus with diabetic peripheral angiopathy without gangrene: Secondary | ICD-10-CM | POA: Diagnosis present

## 2017-07-13 DIAGNOSIS — Z9841 Cataract extraction status, right eye: Secondary | ICD-10-CM

## 2017-07-13 DIAGNOSIS — J90 Pleural effusion, not elsewhere classified: Secondary | ICD-10-CM | POA: Diagnosis not present

## 2017-07-13 DIAGNOSIS — N2581 Secondary hyperparathyroidism of renal origin: Secondary | ICD-10-CM | POA: Diagnosis not present

## 2017-07-13 DIAGNOSIS — Z8249 Family history of ischemic heart disease and other diseases of the circulatory system: Secondary | ICD-10-CM

## 2017-07-13 DIAGNOSIS — E1129 Type 2 diabetes mellitus with other diabetic kidney complication: Secondary | ICD-10-CM | POA: Diagnosis not present

## 2017-07-13 DIAGNOSIS — Z9842 Cataract extraction status, left eye: Secondary | ICD-10-CM | POA: Diagnosis not present

## 2017-07-13 DIAGNOSIS — I69351 Hemiplegia and hemiparesis following cerebral infarction affecting right dominant side: Secondary | ICD-10-CM | POA: Diagnosis not present

## 2017-07-13 DIAGNOSIS — N186 End stage renal disease: Secondary | ICD-10-CM | POA: Diagnosis not present

## 2017-07-13 DIAGNOSIS — D631 Anemia in chronic kidney disease: Secondary | ICD-10-CM | POA: Diagnosis present

## 2017-07-13 DIAGNOSIS — I10 Essential (primary) hypertension: Secondary | ICD-10-CM | POA: Diagnosis present

## 2017-07-13 DIAGNOSIS — R29701 NIHSS score 1: Secondary | ICD-10-CM | POA: Diagnosis present

## 2017-07-13 DIAGNOSIS — M899 Disorder of bone, unspecified: Secondary | ICD-10-CM | POA: Diagnosis present

## 2017-07-13 DIAGNOSIS — L8990 Pressure ulcer of unspecified site, unspecified stage: Secondary | ICD-10-CM | POA: Diagnosis present

## 2017-07-13 HISTORY — DX: Hyperlipidemia, unspecified: E78.5

## 2017-07-13 HISTORY — DX: Acute myocardial infarction, unspecified: I21.9

## 2017-07-13 HISTORY — DX: Atherosclerotic heart disease of native coronary artery without angina pectoris: I25.10

## 2017-07-13 HISTORY — DX: Heart failure, unspecified: I50.9

## 2017-07-13 LAB — CBC WITH DIFFERENTIAL/PLATELET
Basophils Absolute: 0 10*3/uL (ref 0.0–0.1)
Basophils Relative: 0 %
EOS PCT: 2 %
Eosinophils Absolute: 0.1 10*3/uL (ref 0.0–0.7)
HCT: 32 % — ABNORMAL LOW (ref 36.0–46.0)
Hemoglobin: 10.4 g/dL — ABNORMAL LOW (ref 12.0–15.0)
LYMPHS PCT: 17 %
Lymphs Abs: 1 10*3/uL (ref 0.7–4.0)
MCH: 31.1 pg (ref 26.0–34.0)
MCHC: 32.5 g/dL (ref 30.0–36.0)
MCV: 95.8 fL (ref 78.0–100.0)
MONO ABS: 0.7 10*3/uL (ref 0.1–1.0)
MONOS PCT: 11 %
NEUTROS ABS: 4.1 10*3/uL (ref 1.7–7.7)
Neutrophils Relative %: 70 %
PLATELETS: 174 10*3/uL (ref 150–400)
RBC: 3.34 MIL/uL — ABNORMAL LOW (ref 3.87–5.11)
RDW: 14.3 % (ref 11.5–15.5)
WBC: 5.8 10*3/uL (ref 4.0–10.5)

## 2017-07-13 LAB — BASIC METABOLIC PANEL
ANION GAP: 9 (ref 5–15)
BUN: 16 mg/dL (ref 6–20)
CALCIUM: 8.2 mg/dL — AB (ref 8.9–10.3)
CO2: 27 mmol/L (ref 22–32)
Chloride: 98 mmol/L — ABNORMAL LOW (ref 101–111)
Creatinine, Ser: 3.56 mg/dL — ABNORMAL HIGH (ref 0.44–1.00)
GFR calc Af Amer: 13 mL/min — ABNORMAL LOW (ref >60)
GFR, EST NON AFRICAN AMERICAN: 12 mL/min — AB (ref >60)
GLUCOSE: 85 mg/dL (ref 65–99)
Potassium: 3.4 mmol/L — ABNORMAL LOW (ref 3.5–5.1)
Sodium: 134 mmol/L — ABNORMAL LOW (ref 135–145)

## 2017-07-13 LAB — HEPATIC FUNCTION PANEL
ALBUMIN: 2.6 g/dL — AB (ref 3.5–5.0)
ALT: 15 U/L (ref 14–54)
AST: 21 U/L (ref 15–41)
Alkaline Phosphatase: 38 U/L (ref 38–126)
BILIRUBIN INDIRECT: 0.9 mg/dL (ref 0.3–0.9)
Bilirubin, Direct: 0.2 mg/dL (ref 0.1–0.5)
TOTAL PROTEIN: 5.7 g/dL — AB (ref 6.5–8.1)
Total Bilirubin: 1.1 mg/dL (ref 0.3–1.2)

## 2017-07-13 LAB — PROTIME-INR
INR: 1.07
Prothrombin Time: 13.8 seconds (ref 11.4–15.2)

## 2017-07-13 LAB — APTT: aPTT: 28 seconds (ref 24–36)

## 2017-07-13 LAB — TROPONIN I
TROPONIN I: 0.06 ng/mL — AB (ref <0.03)
Troponin I: 0.06 ng/mL (ref <0.03)

## 2017-07-13 LAB — MAGNESIUM: Magnesium: 1.7 mg/dL (ref 1.7–2.4)

## 2017-07-13 LAB — GLUCOSE, CAPILLARY: GLUCOSE-CAPILLARY: 113 mg/dL — AB (ref 65–99)

## 2017-07-13 LAB — ETHANOL

## 2017-07-13 LAB — PHOSPHORUS: Phosphorus: 5.2 mg/dL — ABNORMAL HIGH (ref 2.5–4.6)

## 2017-07-13 MED ORDER — OXYCODONE-ACETAMINOPHEN 5-325 MG PO TABS: 1.0000 | ORAL_TABLET | Freq: Four times a day (QID) | ORAL | Status: DC | PRN

## 2017-07-13 MED ORDER — ENOXAPARIN SODIUM 30 MG/0.3ML ~~LOC~~ SOLN: 30.0000 mg | SUBCUTANEOUS | Status: DC

## 2017-07-13 MED ORDER — ACETAMINOPHEN 650 MG RE SUPP
650.0000 mg | RECTAL | Status: DC | PRN
Start: 2017-07-13 — End: 2017-07-14

## 2017-07-13 MED ORDER — ATORVASTATIN CALCIUM 20 MG PO TABS: 20.0000 mg | ORAL_TABLET | Freq: Every day | ORAL | Status: DC

## 2017-07-13 MED ORDER — FUROSEMIDE 20 MG PO TABS: 20.0000 mg | ORAL_TABLET | Freq: Every day | ORAL | Status: DC

## 2017-07-13 MED ORDER — VITAMIN D 1000 UNITS PO TABS
1000.0000 [IU] | ORAL_TABLET | Freq: Every day | ORAL | Status: DC
  Administered 2017-07-14: 1000 [IU] via ORAL
  Filled 2017-07-13: qty 1

## 2017-07-13 MED ORDER — ENOXAPARIN SODIUM 40 MG/0.4ML ~~LOC~~ SOLN
40.0000 mg | SUBCUTANEOUS | Status: DC
  Administered 2017-07-13: 40 mg via SUBCUTANEOUS
  Filled 2017-07-13: qty 0.4

## 2017-07-13 MED ORDER — ONDANSETRON HCL 4 MG/2ML IJ SOLN: 4.0000 mg | Freq: Four times a day (QID) | INTRAMUSCULAR | Status: DC | PRN

## 2017-07-13 MED ORDER — ASPIRIN EC 81 MG PO TBEC: 81.0000 mg | DELAYED_RELEASE_TABLET | Freq: Every day | ORAL | Status: DC

## 2017-07-13 MED ORDER — HYDRALAZINE HCL 25 MG PO TABS: 25.0000 mg | ORAL_TABLET | Freq: Three times a day (TID) | ORAL | Status: DC

## 2017-07-13 MED ORDER — STROKE: EARLY STAGES OF RECOVERY BOOK
Freq: Once | Status: AC
  Administered 2017-07-14: 14:00:00
  Filled 2017-07-13 (×2): qty 1

## 2017-07-13 MED ORDER — ASPIRIN 325 MG PO TABS
325.0000 mg | ORAL_TABLET | Freq: Every day | ORAL | Status: DC
  Administered 2017-07-14: 325 mg via ORAL
  Filled 2017-07-13: qty 1

## 2017-07-13 MED ORDER — ONDANSETRON HCL 4 MG PO TABS: 4.0000 mg | ORAL_TABLET | Freq: Four times a day (QID) | ORAL | Status: DC | PRN

## 2017-07-13 MED ORDER — ISOSORBIDE MONONITRATE ER 60 MG PO TB24: 30.0000 mg | ORAL_TABLET | Freq: Every day | ORAL | Status: DC

## 2017-07-13 MED ORDER — ACETAMINOPHEN 325 MG PO TABS: 650.0000 mg | ORAL_TABLET | ORAL | Status: DC | PRN

## 2017-07-13 MED ORDER — ASPIRIN 300 MG RE SUPP: 300.0000 mg | Freq: Every day | RECTAL | Status: DC

## 2017-07-13 MED ORDER — PANTOPRAZOLE SODIUM 40 MG PO TBEC
40.0000 mg | DELAYED_RELEASE_TABLET | Freq: Every day | ORAL | Status: DC
  Administered 2017-07-14: 40 mg via ORAL
  Filled 2017-07-13: qty 1

## 2017-07-13 MED ORDER — LOPERAMIDE HCL 2 MG PO CAPS: 2.0000 mg | ORAL_CAPSULE | Freq: Four times a day (QID) | ORAL | Status: DC | PRN

## 2017-07-13 MED ORDER — CITALOPRAM HYDROBROMIDE 20 MG PO TABS
10.0000 mg | ORAL_TABLET | Freq: Every day | ORAL | Status: DC
  Administered 2017-07-14: 10 mg via ORAL
  Filled 2017-07-13: qty 1

## 2017-07-13 MED ORDER — ACETAMINOPHEN 160 MG/5ML PO SOLN: 650.0000 mg | ORAL | Status: DC | PRN

## 2017-07-13 NOTE — H&P (Signed)
History and Physical    Courtney [name]:[id number] DOB: Apr 01, 1940 DOA: 07/13/2017  PCP: Neale Burly, MD   Patient coming from: Dialysis center.  I have personally briefly reviewed patient's old medical records in [place]  Chief Complaint: Facial droop and weakness.  HPI: Courtney [name] is a 77 y.o. female with medical history significant of unspecified CHF, CAD, history of MI, type 2 diabetes, ESRD hemodialysis, hyperlipidemia, hypertension, history of CVA with residual right-sided weakness who is coming to the emergency department referred from the dialysis suite due to right facial droop and body leaning to the right.  Her son just saw her earlier in the ER as stated that he was not much different from her baseline normal.  However, he mentioned she has been weak for several days.  When seen, the patient denied fever, chills, sore throat, dyspnea, chest pain, palpitations, dizziness, diaphoresis, lower extremity edema, abdominal pain, nausea, emesis, diarrhea, melena or hematochezia.  No dysuria, frequency or hematuria.  No heat or cold intolerance.  No rashes.  ED Course: Initial vital signs temperature 37 C (98.6 F), pulse 75, respirations 19, blood pressure 134/74 mmHg and O2 sat 96% on room air.  Work-up shows a white count of 5.8 with a normal differential, hemoglobin 10.4 g/dL and platelets 174.  PT/INR and APTT were within normal limits. Sodium 134, potassium 3.4, chloride 98 and CO2 27 mmol/L.  Alcohol was less than 10, BUN 16, creatinine 3.56, glucose 85, magnesium 1.7, phosphorus 5.2 and calcium 8.2 mg/dL. Troponin 0 0.06 ng/mL.  EKG was sinus rhythm with known RBBB.  Imaging: her chest radiograph showed cardiomegaly with mild interstitial edema.  Moderate left and small right pleural effusions.  MR/MRA brain lacunar infarct in the right centrum semiovale with Suba cube appearance.  There was extensive atheromatous irregularities.  Please see images and full radiology report  for further detail.  Review of Systems: As per HPI otherwise 10 point review of systems negative.    Past Medical History:  Diagnosis Date  . CHF (congestive heart failure) ([place])   . Coronary artery disease   . Diabetes mellitus without complication ([place])   . ESRD on dialysis ([place]) 02/10/2017  . Hyperlipidemia   . Hypertension   . Myocardial infarct ([place])   . Stroke [facility])    2-3 yrs ago- right sided weakness    Past Surgical History:  Procedure Laterality Date  . APPENDECTOMY    . AV FISTULA PLACEMENT Right 09/20/2015   Procedure: PLACEMENT OF RIGHT UPPER EXTREMITY ARTERIOVENOUS GORE-TEX GRAFT FOR HEMODIALYSIS ACCESS;  Surgeon: Vickie Epley, MD;  Location: [facility];  Service: Vascular;  Laterality: Right;  . CATARACT EXTRACTION W/PHACO Left 06/29/2013   Procedure: CATARACT EXTRACTION PHACO AND INTRAOCULAR LENS PLACEMENT (IOC);  Surgeon: Tonny Branch, MD;  Location: [facility];  Service: Ophthalmology;  Laterality: Left;  CDE 12.25  . CATARACT EXTRACTION W/PHACO Right 07/27/2013   Procedure: CATARACT EXTRACTION PHACO AND INTRAOCULAR LENS PLACEMENT (IOC);  Surgeon: Tonny Branch, MD;  Location: [facility];  Service: Ophthalmology;  Laterality: Right;  CDE:7.72  . INSERTION OF DIALYSIS CATHETER Right 09/11/2015   Procedure: INSERTION OF TUNNELED DIALYSIS CATHETER;  Surgeon: Vickie Epley, MD;  Location: [facility];  Service: Vascular;  Laterality: Right;  . INTRAMEDULLARY (IM) NAIL INTERTROCHANTERIC Right 07/20/2012   Procedure: INTRAMEDULLARY (IM) NAIL INTERTROCHANTRIC;  Surgeon: Carole Civil, MD;  Location: [facility];  Service: Orthopedics;  Laterality: Right;  . IR GENERIC HISTORICAL  09/25/2015  IR US GUIDE VASC ACCESS RIGHT 09/25/2015 Sandi Mariscal, MD [facility]  . IR GENERIC HISTORICAL  09/25/2015   IR FLUORO GUIDE CV LINE RIGHT 09/25/2015 Sandi Mariscal, MD [facility]  . IR GENERIC HISTORICAL  09/25/2015   IR REMOVAL TUN CV CATH W/O FL 09/25/2015 Sandi Mariscal, MD [facility]     reports that she has  never smoked. She has never used smokeless tobacco. She reports that she does not drink alcohol or use drugs.  Allergies  Allergen Reactions  . Grapefruit Flavor [Flavoring Agent]     Reaction unknown    Family History  Problem Relation Age of Onset  . Heart disease Mother   . Heart disease Father     Prior to Admission medications   Medication Sig Start Date End Date Taking? Authorizing Provider  aspirin EC 81 MG tablet Take 81 mg by mouth daily.   Yes [provider]  atorvastatin (LIPITOR) 20 MG tablet Take 20 mg by mouth daily.   Yes [provider]  calcium-vitamin D (OSCAL WITH D) 500-200 MG-UNIT tablet Take 1 tablet by mouth.   Yes [provider]  cholecalciferol (VITAMIN D) 400 units TABS tablet Take 1,000 Units by mouth daily.    Yes [provider]  escitalopram (LEXAPRO) 5 MG tablet Take 5 mg by mouth daily.   Yes [provider]  furosemide (LASIX) 20 MG tablet Take 20 mg by mouth daily.    Yes [provider]  hydrALAZINE (APRESOLINE) 25 MG tablet Take 25 mg by mouth 3 (three) times daily. 07/22/12  Yes Kathie Dike, MD  isosorbide mononitrate (IMDUR) 30 MG 24 hr tablet Take 30 mg by mouth daily.   Yes [provider]  K Phos Mono-Sod Phos Di & Mono (PHOSPHA 250 NEUTRAL) [phone] MG TABS Take 1 tablet by mouth daily.    Yes [provider]  loperamide (IMODIUM A-D) 2 MG tablet Take 2 mg by mouth every 6 (six) hours as needed for diarrhea or loose stools.   Yes [provider]  omeprazole (PRILOSEC) 40 MG capsule Take 1 capsule (40 mg total) by mouth daily. 02/10/17  Yes Setzer, Rona Ravens, NP  oxyCODONE-acetaminophen (ROXICET) 5-325 MG tablet Take 1 tablet by mouth every 6 (six) hours as needed for severe pain. 09/20/15  Yes Vickie Epley, MD    Physical Exam: Vitals:   07/13/17 1530 07/13/17 1800 07/13/17 1830 07/13/17 1832  BP: 138/67 (!) 145/61 140/65 140/65  Pulse: 79 78 73 74    Resp: 19 19 10 18   Temp: 98.6 F (37 C)     TempSrc: Oral     SpO2: 97% 94% 97% 97%    Constitutional: NAD, calm, comfortable Eyes: PERRL, lids and conjunctivae normal ENMT: Mucous membranes are moist. Posterior pharynx clear of any exudate or lesions. Neck: normal, supple, no masses, no thyromegaly Respiratory: Decreased breath sounds on bases, otherwise clear to auscultation bilaterally, no wheezing, no crackles. Normal respiratory effort. No accessory muscle use.  Cardiovascular: Regular rate and rhythm, no murmurs / rubs / gallops. No extremity edema. 2+ pedal pulses. No carotid bruits.  Abdomen: Soft, no tenderness, no masses palpated. No hepatosplenomegaly. Bowel sounds positive.  Musculoskeletal: no clubbing / cyanosis.Good ROM, no contractures.  Significant atrophy of muscles of all extremities. Skin: no gross rashes, lesions, ulcers on very limited dermatological examination. Neurologic: CN 2-12 grossly intact.  No facial droop noticed at the time of my examination.  Subjective decrease in sensation on right  side extremities (baseline).  DTR decreased on RLE.  Right-sided hemiparesis at baseline. Psychiatric: Normal judgment and insight. Alert and oriented x 3.    Labs on Admission: I have personally reviewed following labs and imaging studies  CBC: Recent Labs  Lab 07/13/17 1539  WBC 5.8  NEUTROABS 4.1  HGB 10.4*  HCT 32.0*  MCV 95.8  PLT 025   Basic Metabolic Panel: Recent Labs  Lab 07/13/17 1539  NA 134*  K 3.4*  CL 98*  CO2 27  GLUCOSE 85  BUN 16  CREATININE 3.56*  CALCIUM 8.2*   GFR: CrCl cannot be calculated (Unknown ideal weight.). Liver Function Tests: No results for input(s): AST, ALT, ALKPHOS, BILITOT, PROT, ALBUMIN in the last 168 hours. No results for input(s): LIPASE, AMYLASE in the last 168 hours. No results for input(s): AMMONIA in the last 168 hours. Coagulation Profile: Recent Labs  Lab 07/13/17 1835  INR 1.07   Cardiac  Enzymes: No results for input(s): CKTOTAL, CKMB, CKMBINDEX, TROPONINI in the last 168 hours. BNP (last 3 results) No results for input(s): PROBNP in the last 8760 hours. HbA1C: No results for input(s): HGBA1C in the last 72 hours. CBG: No results for input(s): GLUCAP in the last 168 hours. Lipid Profile: No results for input(s): CHOL, HDL, LDLCALC, TRIG, CHOLHDL, LDLDIRECT in the last 72 hours. Thyroid Function Tests: No results for input(s): TSH, T4TOTAL, FREET4, T3FREE, THYROIDAB in the last 72 hours. Anemia Panel: No results for input(s): VITAMINB12, FOLATE, FERRITIN, TIBC, IRON, RETICCTPCT in the last 72 hours. Urine analysis:    Component Value Date/Time   COLORURINE YELLOW 07/17/2012 1751   APPEARANCEUR CLOUDY (A) 07/17/2012 1751   LABSPEC 1.020 07/17/2012 1751   PHURINE 5.5 07/17/2012 1751   GLUCOSEU NEGATIVE 07/17/2012 1751   HGBUR SMALL (A) 07/17/2012 1751   BILIRUBINUR NEGATIVE 07/17/2012 1751   KETONESUR NEGATIVE 07/17/2012 1751   PROTEINUR 30 (A) 07/17/2012 1751   UROBILINOGEN 0.2 07/17/2012 1751   NITRITE NEGATIVE 07/17/2012 1751   LEUKOCYTESUR SMALL (A) 07/17/2012 1751    Radiological Exams on Admission: Dg Chest 2 View  Result Date: 07/13/2017 CLINICAL DATA:  Weakness EXAM: CHEST - 2 VIEW COMPARISON:  05/31/2017 FINDINGS: Cardiomegaly with moderate interstitial edema, most prominent in the left perihilar region. Moderate left and small right pleural effusions.  No pneumothorax. When compared to the prior, this is mildly increased. IMPRESSION: Cardiomegaly with mild interstitial edema. Moderate left and small right pleural effusions. Electronically Signed   By: Julian Hy M.D.   On: 07/13/2017 16:43   Mr Jodene Nam Head Wo Contrast  Result Date: 07/13/2017 CLINICAL DATA:  Right facial droop and leaning after dialysis. EXAM: MRI HEAD WITHOUT CONTRAST MRA HEAD WITHOUT CONTRAST TECHNIQUE: Multiplanar, multiecho pulse sequences of the brain and surrounding structures  were obtained without intravenous contrast. Angiographic images of the head were obtained using MRA technique without contrast. COMPARISON:  Head CT 05/31/2017. FINDINGS: MRI HEAD FINDINGS Brain: 6 mm area of DWI hyperintensity in the central right corona radiata is isointense on ADC map. Advanced chronic small vessel disease with confluent gliosis in the cerebral white matter. Remote lacunar infarcts in the deep gray nuclei, especially bilateral thalamus. Moderate remote right cerebellar infarct. Small remote lateral left frontal cortex infarct. No acute hemorrhage, hydrocephalus, masslike finding, or collection. Generalized atrophy. Vascular: Arterial findings below. Normal dural venous sinus flow voids. Skull and upper cervical spine: No evidence of marrow lesion Sinuses/Orbits: Bilateral cataract resection. MRA HEAD FINDINGS Limited by motion. The non dominant left  vertebral artery has poor flow, possibly from a stenosis in the neck. High-grade narrowing of the right cavernous ICA at the posterior and anterior genu. Nonvisualized right A1 segment, likely developmental given the robust anterior communicating artery. There is atherosclerotic irregularity of bilateral medium size vessels. Apparent high-grade narrowing in the distal left M1 segment and right M2 branches. IMPRESSION: Brain MRI: 1. Lacunar infarct in the right centrum semiovale with subacute appearance. No acute infarct in the left hemisphere or brainstem to explain right-sided deficits. 2. Severe chronic ischemic injury as described. MRA: 1. Extensive atheromatous irregularity accentuated by motion. 2. Tandem high-grade right cavernous narrowing. The right MCA is isolated due to circle-of-Willis anatomy. 3. Left M1 and multifocal right M2 apparent high-grade narrowing. 4. Poor flow in the non dominant left vertebral artery which may be from flow limiting stenosis in the neck. Electronically Signed   By: Monte Fantasia M.D.   On: 07/13/2017 16:43    Mr Brain Wo Contrast  Result Date: 07/13/2017 CLINICAL DATA:  Right facial droop and leaning after dialysis. EXAM: MRI HEAD WITHOUT CONTRAST MRA HEAD WITHOUT CONTRAST TECHNIQUE: Multiplanar, multiecho pulse sequences of the brain and surrounding structures were obtained without intravenous contrast. Angiographic images of the head were obtained using MRA technique without contrast. COMPARISON:  Head CT 05/31/2017. FINDINGS: MRI HEAD FINDINGS Brain: 6 mm area of DWI hyperintensity in the central right corona radiata is isointense on ADC map. Advanced chronic small vessel disease with confluent gliosis in the cerebral white matter. Remote lacunar infarcts in the deep gray nuclei, especially bilateral thalamus. Moderate remote right cerebellar infarct. Small remote lateral left frontal cortex infarct. No acute hemorrhage, hydrocephalus, masslike finding, or collection. Generalized atrophy. Vascular: Arterial findings below. Normal dural venous sinus flow voids. Skull and upper cervical spine: No evidence of marrow lesion Sinuses/Orbits: Bilateral cataract resection. MRA HEAD FINDINGS Limited by motion. The non dominant left vertebral artery has poor flow, possibly from a stenosis in the neck. High-grade narrowing of the right cavernous ICA at the posterior and anterior genu. Nonvisualized right A1 segment, likely developmental given the robust anterior communicating artery. There is atherosclerotic irregularity of bilateral medium size vessels. Apparent high-grade narrowing in the distal left M1 segment and right M2 branches. IMPRESSION: Brain MRI: 1. Lacunar infarct in the right centrum semiovale with subacute appearance. No acute infarct in the left hemisphere or brainstem to explain right-sided deficits. 2. Severe chronic ischemic injury as described. MRA: 1. Extensive atheromatous irregularity accentuated by motion. 2. Tandem high-grade right cavernous narrowing. The right MCA is isolated due to  circle-of-Willis anatomy. 3. Left M1 and multifocal right M2 apparent high-grade narrowing. 4. Poor flow in the non dominant left vertebral artery which may be from flow limiting stenosis in the neck. Electronically Signed   By: Monte Fantasia M.D.   On: 07/13/2017 16:43   Echocardiogram without contrast 07/19/2012 ------------------------------------------------------------ LV EF: 60% -  65%  ------------------------------------------------------------ Indications:   Syncope 780.2.  ------------------------------------------------------------ History:  PMH: CVD, Right hip FX Risk factors: Hypertension. Diabetes mellitus.  ------------------------------------------------------------ Study Conclusions  - Left ventricle: The cavity size was normal. There wasborderline concentric hypertrophy. Systolic function was normal. The estimated ejection fraction was in the range of 60% to 65%. R[place] motion was normal; there were no regional [place] motion abnormalities. - Mitral valve: Calcified annulus. - Right ventricle: The cavity size was normal. Trejos thickness was mildly increased. - Atrial septum: No defect or patent foramen ovale was identified.   EKG: Independently reviewed.  Vent.  rate 74 BPM PR interval * ms QRS duration 124 ms QT/QTc 543/603 ms P-R-T axes 59 83 35 Sinus rhythm Left atrial enlargement Right bundle branch block Not significantly changed from previous.  Assessment/Plan Principal Problem:   Acute CVA (cerebrovascular accident) [facility]) Telemetry/inpatient. Supplemental oxygen as needed. Continue frequent neuro checks. Check hemoglobin A1c and fasting lipid. Check carotid Doppler and echocardiogram. Consult PT/OT/SLP. Consult care management. Consult neurology.  Active Problems:   HTN (hypertension) Allow permissive hypertension up to 220/110 mmHg. Hold furosemide hydralazine and isosorbide. Monitor blood pressure.    ESRD on dialysis  [facility]) Consult nephrology. Continue hemodialysis 3 times a week on T-T-S.    Hypokalemia Minimally low. Continue to monitor level. Will not replace given history of ESRD.    Anemia in ESRD (end-stage renal disease) (HCC) Monitor hematocrit and hemoglobin. Erythropoietin per nephrology.    Hyperlipidemia Atorvastatin 20 mg p.o. daily.    Coronary artery disease Continue aspirin 81 mg p.o. daily. Continue atorvastatin.    Type 2 diabetes mellitus (HCC) Carbohydrate modified diet. CBG monitoring with regular insulin sliding scale.     DVT prophylaxis: Lovenox SQ. Code Status: Full code. Family Communication:  Disposition Plan: Admit for CVA work-up and treatment. Consults called: Routine neurology consult. Admission status: Inpatient/telemetry.   Reubin Milan MD [facility] Pager [phone].  If 7PM-7AM, please contact night-coverage [email or web address] Password [facility]  07/13/2017, 7:46 PM

## 2017-07-13 NOTE — ED Notes (Signed)
Phlebotomist unable to obtain bloodwork. 2 RN's have attempted blood draw with no success. EDP notified and order given for RT to attempt artery stick for blood work. RT notified.

## 2017-07-13 NOTE — ED Notes (Signed)
EDP aware of pt 

## 2017-07-13 NOTE — ED Notes (Signed)
Date and time results received: 07/13/17 1946 (use smartphrase ".now" to insert current time)  Test: troponin Critical Value: 0.06  Name of Provider Notified: Dr. Olevia Bowens notified via [facility] text at 2012  Orders Received? Or Actions Taken?: no/na

## 2017-07-13 NOTE — ED Notes (Signed)
Respiratory attempted x 2 to get blood work from radial artery stick. Unsuccessful. EDP notified.

## 2017-07-13 NOTE — ED Provider Notes (Signed)
[facility] EMERGENCY DEPARTMENT Provider Note   CSN: [id number] Arrival date & time: 07/13/17  1513     History   Chief Complaint Chief Complaint  Patient presents with  . Weakness    HPI Courtney [name] is a 77 y.o. female.  HPI Patient is a dialysis patient.  Reportedly was on dialysis today and they noticed she was leaning to the right and right-sided facial droop.  History of a previous stroke and chronic right-sided facial droop and right-sided weakness.  Patient son states is not much different than her normal.  She has however been somewhat generally weak over the last few days.  Somewhat decreased appetite.  No fevers.  No chills.  Has had some pain at some chronic decubitus ulcers.  Had a full run of dialysis today for the last 2 dialysis episode she left early.  No cough.  No shortness of breath. Past Medical History:  Diagnosis Date  . CHF (congestive heart failure) ([place])   . Diabetes mellitus without complication ([place])   . ESRD on dialysis ([place]) 02/10/2017  . Hypertension   . Myocardial infarct ([place])   . Stroke [facility])    2-3 yrs ago- right sided weakness    Patient Active Problem List   Diagnosis Date Noted  . ESRD on dialysis ([place]) 02/10/2017  . Intertrochanteric fracture of right hip ([place]) 08/16/2012  . Syncope 07/17/2012  . Closed right hip fracture ([place]) 07/17/2012  . HTN (hypertension) 07/17/2012  . DM (diabetes mellitus) ([place]) 07/17/2012  . Cerebrovascular disease 07/17/2012    Past Surgical History:  Procedure Laterality Date  . APPENDECTOMY    . AV FISTULA PLACEMENT Right 09/20/2015   Procedure: PLACEMENT OF RIGHT UPPER EXTREMITY ARTERIOVENOUS GORE-TEX GRAFT FOR HEMODIALYSIS ACCESS;  Surgeon: Vickie Epley, MD;  Location: [facility] ORS;  Service: Vascular;  Laterality: Right;  . CATARACT EXTRACTION W/PHACO Left 06/29/2013   Procedure: CATARACT EXTRACTION PHACO AND INTRAOCULAR LENS PLACEMENT (IOC);  Surgeon: Tonny Branch, MD;  Location: [facility] ORS;  Service:  Ophthalmology;  Laterality: Left;  CDE 12.25  . CATARACT EXTRACTION W/PHACO Right 07/27/2013   Procedure: CATARACT EXTRACTION PHACO AND INTRAOCULAR LENS PLACEMENT (IOC);  Surgeon: Tonny Branch, MD;  Location: [facility] ORS;  Service: Ophthalmology;  Laterality: Right;  CDE:7.72  . INSERTION OF DIALYSIS CATHETER Right 09/11/2015   Procedure: INSERTION OF TUNNELED DIALYSIS CATHETER;  Surgeon: Vickie Epley, MD;  Location: [facility] ORS;  Service: Vascular;  Laterality: Right;  . INTRAMEDULLARY (IM) NAIL INTERTROCHANTERIC Right 07/20/2012   Procedure: INTRAMEDULLARY (IM) NAIL INTERTROCHANTRIC;  Surgeon: Carole Civil, MD;  Location: [facility];  Service: Orthopedics;  Laterality: Right;  . IR GENERIC HISTORICAL  09/25/2015   IR US GUIDE VASC ACCESS RIGHT 09/25/2015 Sandi Mariscal, MD [facility]  . IR GENERIC HISTORICAL  09/25/2015   IR FLUORO GUIDE CV LINE RIGHT 09/25/2015 Sandi Mariscal, MD [facility]  . IR GENERIC HISTORICAL  09/25/2015   IR REMOVAL TUN CV CATH W/O FL 09/25/2015 Sandi Mariscal, MD [facility]     OB History   None      Home Medications    Prior to Admission medications   Medication Sig Start Date End Date Taking? Authorizing Provider  aspirin EC 81 MG tablet Take 81 mg by mouth daily.   Yes [provider]  atorvastatin (LIPITOR) 20 MG tablet Take 20 mg by mouth daily.   Yes [provider]  calcium-vitamin D (OSCAL WITH D) 500-200 MG-UNIT tablet Take 1 tablet by mouth.  Yes [provider]  cholecalciferol (VITAMIN D) 400 units TABS tablet Take 1,000 Units by mouth daily.    Yes [provider]  escitalopram (LEXAPRO) 5 MG tablet Take 5 mg by mouth daily.   Yes [provider]  furosemide (LASIX) 20 MG tablet Take 20 mg by mouth daily.    Yes [provider]  hydrALAZINE (APRESOLINE) 25 MG tablet Take 25 mg by mouth 3 (three) times daily. 07/22/12  Yes Kathie Dike, MD  isosorbide mononitrate (IMDUR) 30 MG 24 hr tablet Take 30 mg by mouth  daily.   Yes [provider]  K Phos Mono-Sod Phos Di & Mono (PHOSPHA 250 NEUTRAL) 155-852-130 MG TABS Take 1 tablet by mouth daily.    Yes [provider]  loperamide (IMODIUM A-D) 2 MG tablet Take 2 mg by mouth every 6 (six) hours as needed for diarrhea or loose stools.   Yes [provider]  omeprazole (PRILOSEC) 40 MG capsule Take 1 capsule (40 mg total) by mouth daily. 02/10/17  Yes Setzer, Rona Ravens, NP  oxyCODONE-acetaminophen (ROXICET) 5-325 MG tablet Take 1 tablet by mouth every 6 (six) hours as needed for severe pain. 09/20/15  Yes Vickie Epley, MD    Family History Family History  Problem Relation Age of Onset  . Heart disease Mother   . Heart disease Father     Social History Social History   Tobacco Use  . Smoking status: Never Smoker  . Smokeless tobacco: Never Used  Substance Use Topics  . Alcohol use: No  . Drug use: No     Allergies   Grapefruit flavor [flavoring agent]   Review of Systems Review of Systems  Constitutional: Positive for appetite change.  HENT: Negative for congestion.   Respiratory: Negative for shortness of breath.   Cardiovascular: Negative for chest pain.  Gastrointestinal: Negative for abdominal pain.  Genitourinary: Negative for dysuria.  Musculoskeletal: Negative for back pain.  Neurological: Positive for weakness. Negative for headaches.  Psychiatric/Behavioral: Negative for confusion.     Physical Exam Updated Vital Signs BP 140/65   Pulse 74   Temp 98.6 F (37 C) (Oral)   Resp 18   SpO2 97%   Physical Exam  Constitutional: She is oriented to person, place, and time. She appears well-developed.  HENT:  Head: Atraumatic.  Eyes: Pupils are equal, round, and reactive to light.  Cardiovascular: Normal rate.  Pulmonary/Chest: Effort normal.  Abdominal: There is no tenderness.  Musculoskeletal: She exhibits no tenderness.  Neurological: She is alert and oriented to person, place, and time.    Mild right-sided facial droop.  Chronic per son.  Also chronic right-sided weakness compared to left.  Generally weak.  Decreased straight leg raise on right compared to left.  Skin: Skin is warm. Capillary refill takes less than 2 seconds.     ED Treatments / Results  Labs (all labs ordered are listed, but only abnormal results are displayed) Labs Reviewed  BASIC METABOLIC PANEL - Abnormal; Notable for the following components:      Result Value   Sodium 134 (*)    Potassium 3.4 (*)    Chloride 98 (*)    Creatinine, Ser 3.56 (*)    Calcium 8.2 (*)    GFR calc non Af Amer 12 (*)    GFR calc Af Amer 13 (*)    All other components within normal limits  CBC WITH DIFFERENTIAL/PLATELET - Abnormal; Notable for the following components:   RBC 3.34 (*)  Hemoglobin 10.4 (*)    HCT 32.0 (*)    All other components within normal limits  ETHANOL  PROTIME-INR  APTT  TROPONIN I  RAPID URINE DRUG SCREEN, HOSP PERFORMED  URINALYSIS, ROUTINE W REFLEX MICROSCOPIC    EKG EKG Interpretation  Date/Time:  Tuesday Jul 13 2017 15:45:02 EDT Ventricular Rate:  74 PR Interval:    QRS Duration: 124 QT Interval:  543 QTC Calculation: 603 R Axis:   83 Text Interpretation:  Sinus rhythm Left atrial enlargement Right bundle branch block Confirmed by Davonna Belling [phone]) on 07/13/2017 3:52:42 PM   Radiology Dg Chest 2 View  Result Date: 07/13/2017 CLINICAL DATA:  Weakness EXAM: CHEST - 2 VIEW COMPARISON:  05/31/2017 FINDINGS: Cardiomegaly with moderate interstitial edema, most prominent in the left perihilar region. Moderate left and small right pleural effusions.  No pneumothorax. When compared to the prior, this is mildly increased. IMPRESSION: Cardiomegaly with mild interstitial edema. Moderate left and small right pleural effusions. Electronically Signed   By: Julian Hy M.D.   On: 07/13/2017 16:43   Mr Jodene Nam Head Wo Contrast  Result Date: 07/13/2017 CLINICAL DATA:  Right facial  droop and leaning after dialysis. EXAM: MRI HEAD WITHOUT CONTRAST MRA HEAD WITHOUT CONTRAST TECHNIQUE: Multiplanar, multiecho pulse sequences of the brain and surrounding structures were obtained without intravenous contrast. Angiographic images of the head were obtained using MRA technique without contrast. COMPARISON:  Head CT 05/31/2017. FINDINGS: MRI HEAD FINDINGS Brain: 6 mm area of DWI hyperintensity in the central right corona radiata is isointense on ADC map. Advanced chronic small vessel disease with confluent gliosis in the cerebral white matter. Remote lacunar infarcts in the deep gray nuclei, especially bilateral thalamus. Moderate remote right cerebellar infarct. Small remote lateral left frontal cortex infarct. No acute hemorrhage, hydrocephalus, masslike finding, or collection. Generalized atrophy. Vascular: Arterial findings below. Normal dural venous sinus flow voids. Skull and upper cervical spine: No evidence of marrow lesion Sinuses/Orbits: Bilateral cataract resection. MRA HEAD FINDINGS Limited by motion. The non dominant left vertebral artery has poor flow, possibly from a stenosis in the neck. High-grade narrowing of the right cavernous ICA at the posterior and anterior genu. Nonvisualized right A1 segment, likely developmental given the robust anterior communicating artery. There is atherosclerotic irregularity of bilateral medium size vessels. Apparent high-grade narrowing in the distal left M1 segment and right M2 branches. IMPRESSION: Brain MRI: 1. Lacunar infarct in the right centrum semiovale with subacute appearance. No acute infarct in the left hemisphere or brainstem to explain right-sided deficits. 2. Severe chronic ischemic injury as described. MRA: 1. Extensive atheromatous irregularity accentuated by motion. 2. Tandem high-grade right cavernous narrowing. The right MCA is isolated due to circle-of-Willis anatomy. 3. Left M1 and multifocal right M2 apparent high-grade narrowing. 4.  Poor flow in the non dominant left vertebral artery which may be from flow limiting stenosis in the neck. Electronically Signed   By: Monte Fantasia M.D.   On: 07/13/2017 16:43   Mr Brain Wo Contrast  Result Date: 07/13/2017 CLINICAL DATA:  Right facial droop and leaning after dialysis. EXAM: MRI HEAD WITHOUT CONTRAST MRA HEAD WITHOUT CONTRAST TECHNIQUE: Multiplanar, multiecho pulse sequences of the brain and surrounding structures were obtained without intravenous contrast. Angiographic images of the head were obtained using MRA technique without contrast. COMPARISON:  Head CT 05/31/2017. FINDINGS: MRI HEAD FINDINGS Brain: 6 mm area of DWI hyperintensity in the central right corona radiata is isointense on ADC map. Advanced chronic small vessel disease with confluent  gliosis in the cerebral white matter. Remote lacunar infarcts in the deep gray nuclei, especially bilateral thalamus. Moderate remote right cerebellar infarct. Small remote lateral left frontal cortex infarct. No acute hemorrhage, hydrocephalus, masslike finding, or collection. Generalized atrophy. Vascular: Arterial findings below. Normal dural venous sinus flow voids. Skull and upper cervical spine: No evidence of marrow lesion Sinuses/Orbits: Bilateral cataract resection. MRA HEAD FINDINGS Limited by motion. The non dominant left vertebral artery has poor flow, possibly from a stenosis in the neck. High-grade narrowing of the right cavernous ICA at the posterior and anterior genu. Nonvisualized right A1 segment, likely developmental given the robust anterior communicating artery. There is atherosclerotic irregularity of bilateral medium size vessels. Apparent high-grade narrowing in the distal left M1 segment and right M2 branches. IMPRESSION: Brain MRI: 1. Lacunar infarct in the right centrum semiovale with subacute appearance. No acute infarct in the left hemisphere or brainstem to explain right-sided deficits. 2. Severe chronic ischemic  injury as described. MRA: 1. Extensive atheromatous irregularity accentuated by motion. 2. Tandem high-grade right cavernous narrowing. The right MCA is isolated due to circle-of-Willis anatomy. 3. Left M1 and multifocal right M2 apparent high-grade narrowing. 4. Poor flow in the non dominant left vertebral artery which may be from flow limiting stenosis in the neck. Electronically Signed   By: Monte Fantasia M.D.   On: 07/13/2017 16:43    Procedures Procedures (including critical care time)  Medications Ordered in ED Medications - No data to display   Initial Impression / Assessment and Plan / ED Course  I have reviewed the triage vital signs and the nursing notes.  Pertinent labs & imaging results that were available during my care of the patient were reviewed by me and considered in my medical decision making (see chart for details).     Patient presented with weakness.  Reportedly had been on the right side at dialysis but per son patient has had more just general weakness also.  CT scan shows subacute stroke, although the MRI findings were not initially associated with right-sided weakness.  However the general weakness could be some left-sided weakness that goes along with previous right-sided deficits.  With positive stroke on his MRI and questionable other deficits today will admit to hospitalist.  She is a dialysis patient and was dialyzed today.  Final Clinical Impressions(s) / ED Diagnoses   Final diagnoses:  Cerebrovascular accident (CVA), unspecified mechanism ([place])  End stage renal disease on dialysis [facility])    ED Discharge Orders    None       Davonna Belling, MD 07/13/17 [place]

## 2017-07-13 NOTE — ED Triage Notes (Signed)
Pt sent from dialysis. They stated went to get pt from lobby to start and noticed pt was leaning to right and facial droop to right. Pt finished dialysis and then they sent son to bring pt to Ed. Pt has not facial droop and is not leaning to any side. Son stated her last normal was Saturday. Pt states smile and arm drift test is her nromal. Pt unable to hold right arm all the way out due to prior stroke. Son states that is nromal. Son states only thing he has noticed is that since Saturday she has been more weak than normal due to him having to do more for her when he states pt usually helps. A/o.

## 2017-07-14 ENCOUNTER — Inpatient Hospital Stay (HOSPITAL_COMMUNITY): Payer: Medicare Other

## 2017-07-14 ENCOUNTER — Other Ambulatory Visit: Payer: Self-pay

## 2017-07-14 DIAGNOSIS — Z992 Dependence on renal dialysis: Secondary | ICD-10-CM | POA: Diagnosis not present

## 2017-07-14 DIAGNOSIS — I6523 Occlusion and stenosis of bilateral carotid arteries: Secondary | ICD-10-CM | POA: Diagnosis not present

## 2017-07-14 DIAGNOSIS — E876 Hypokalemia: Secondary | ICD-10-CM | POA: Diagnosis not present

## 2017-07-14 DIAGNOSIS — I2510 Atherosclerotic heart disease of native coronary artery without angina pectoris: Secondary | ICD-10-CM | POA: Diagnosis not present

## 2017-07-14 DIAGNOSIS — I509 Heart failure, unspecified: Secondary | ICD-10-CM | POA: Diagnosis not present

## 2017-07-14 DIAGNOSIS — I639 Cerebral infarction, unspecified: Principal | ICD-10-CM

## 2017-07-14 DIAGNOSIS — N186 End stage renal disease: Secondary | ICD-10-CM | POA: Diagnosis not present

## 2017-07-14 DIAGNOSIS — E1122 Type 2 diabetes mellitus with diabetic chronic kidney disease: Secondary | ICD-10-CM | POA: Diagnosis not present

## 2017-07-14 DIAGNOSIS — E1129 Type 2 diabetes mellitus with other diabetic kidney complication: Secondary | ICD-10-CM | POA: Diagnosis not present

## 2017-07-14 DIAGNOSIS — I69351 Hemiplegia and hemiparesis following cerebral infarction affecting right dominant side: Secondary | ICD-10-CM | POA: Diagnosis not present

## 2017-07-14 DIAGNOSIS — I132 Hypertensive heart and chronic kidney disease with heart failure and with stage 5 chronic kidney disease, or end stage renal disease: Secondary | ICD-10-CM | POA: Diagnosis not present

## 2017-07-14 LAB — CBC
HCT: 32.4 % — ABNORMAL LOW (ref 36.0–46.0)
Hemoglobin: 10.2 g/dL — ABNORMAL LOW (ref 12.0–15.0)
MCH: 30.8 pg (ref 26.0–34.0)
MCHC: 31.5 g/dL (ref 30.0–36.0)
MCV: 97.9 fL (ref 78.0–100.0)
PLATELETS: 167 10*3/uL (ref 150–400)
RBC: 3.31 MIL/uL — AB (ref 3.87–5.11)
RDW: 14.6 % (ref 11.5–15.5)
WBC: 6.1 10*3/uL (ref 4.0–10.5)

## 2017-07-14 LAB — GLUCOSE, CAPILLARY
GLUCOSE-CAPILLARY: 89 mg/dL (ref 65–99)
Glucose-Capillary: 79 mg/dL (ref 65–99)

## 2017-07-14 LAB — TROPONIN I: TROPONIN I: 0.06 ng/mL — AB (ref <0.03)

## 2017-07-14 LAB — RENAL FUNCTION PANEL
ALBUMIN: 2.6 g/dL — AB (ref 3.5–5.0)
Anion gap: 12 (ref 5–15)
BUN: 19 mg/dL (ref 6–20)
CALCIUM: 8.4 mg/dL — AB (ref 8.9–10.3)
CO2: 28 mmol/L (ref 22–32)
CREATININE: 4.16 mg/dL — AB (ref 0.44–1.00)
Chloride: 99 mmol/L — ABNORMAL LOW (ref 101–111)
GFR calc Af Amer: 11 mL/min — ABNORMAL LOW (ref >60)
GFR calc non Af Amer: 10 mL/min — ABNORMAL LOW (ref >60)
GLUCOSE: 80 mg/dL (ref 65–99)
PHOSPHORUS: 5.8 mg/dL — AB (ref 2.5–4.6)
POTASSIUM: 3.5 mmol/L (ref 3.5–5.1)
SODIUM: 139 mmol/L (ref 135–145)

## 2017-07-14 LAB — LIPID PANEL
Cholesterol: 116 mg/dL (ref 0–200)
HDL: 61 mg/dL (ref >40)
LDL Cholesterol: 40 mg/dL (ref 0–99)
Total CHOL/HDL Ratio: 1.9 RATIO
Triglycerides: 73 mg/dL (ref <150)
VLDL: 15 mg/dL (ref 0–40)

## 2017-07-14 LAB — HEMOGLOBIN A1C
HEMOGLOBIN A1C: 4.3 % — AB (ref 4.8–5.6)
MEAN PLASMA GLUCOSE: 76.71 mg/dL

## 2017-07-14 LAB — MRSA PCR SCREENING: MRSA by PCR: INVALID — AB

## 2017-07-14 MED ORDER — EPOETIN ALFA 2000 UNIT/ML IJ SOLN
2000.0000 [IU] | INTRAMUSCULAR | Status: DC
  Filled 2017-07-14: qty 1

## 2017-07-14 MED ORDER — CLOPIDOGREL BISULFATE 75 MG PO TABS: 75.0000 mg | ORAL_TABLET | Freq: Every day | ORAL | 2 refills | Status: AC

## 2017-07-14 MED ORDER — CHLORHEXIDINE GLUCONATE CLOTH 2 % EX PADS: 6.0000 | MEDICATED_PAD | Freq: Every day | CUTANEOUS | Status: DC

## 2017-07-14 NOTE — Plan of Care (Signed)
  Problem: Acute Rehab PT Goals(only PT should resolve) Goal: Pt Will Go Supine/Side To Sit Outcome: Progressing Flowsheets (Taken 07/14/2017 1224) Pt will go Supine/Side to Sit: with min guard assist Goal: Patient Will Transfer Sit To/From Stand Outcome: Progressing Flowsheets (Taken 07/14/2017 1224) Patient will transfer sit to/from stand: with minimal assist Goal: Pt Will Transfer Bed To Chair/Chair To Bed Outcome: Progressing Flowsheets (Taken 07/14/2017 1224) Pt will Transfer Bed to Chair/Chair to Bed: with min assist Goal: Pt Will Ambulate Outcome: Progressing Flowsheets (Taken 07/14/2017 1224) Pt will Ambulate: 10 feet;with moderate assist;with cane  12:25 PM, 07/14/17 Lonell Grandchild, MPT Physical Therapist with [facility] [phone] office [phone] mobile phone

## 2017-07-14 NOTE — Consult Note (Signed)
Courtney [name] MRN: [id number] DOB/AGE: September 16, 1940 77 y.o. Primary Care Physician:Hasanaj, Samul Dada, MD Admit date: 07/13/2017 Chief Complaint:  Chief Complaint  Patient presents with  . Weakness   HPI: Pt is a 77 year old  female with past medical history significant for, CAD,type 2 diabetes, ESRD , CVA with residual right-sided weakness who was sent to ER from the dialysis suite due to right facial droop and body leaning to the right.    HPI dates back to yesterday when staff at the dialysis unit noted facial droop amd called EMS. Pt son saw her  in the ER and stated that he was not much different from her baseline normal.  Pt son later mentioned she has been weak for several days.   Patient offer no c/o fever/ chills/ sore throat. NO c/o dyspnea/ chest pain/ palpitations. NP c/o dizzines. NO c/o diaphoresi NO c/o lower extremity edema, NO c/o abdominal pain/ nausea/ emesis/ diarrhea/melena or hematochezia.   No c/o dysuria/ frequency or hematuria.   No c/o rashes.  Upon evaluation in ER pt has MRI and CT and there was a possibilty of new CVA and pt was admitted  Past Medical History:  Diagnosis Date  . CHF (congestive heart failure) ([place])   . Coronary artery disease   . Diabetes mellitus without complication ([place])   . ESRD on dialysis ([place]) 02/10/2017  . Hyperlipidemia   . Hypertension   . Myocardial infarct ([place])   . Stroke [facility])    2-3 yrs ago- right sided weakness        Family History  Problem Relation Age of Onset  . Heart disease Mother   . Heart disease Father     Social History:  reports that she has never smoked. She has never used smokeless tobacco. She reports that she does not drink alcohol or use drugs.   Allergies:  Allergies  Allergen Reactions  . Grapefruit Flavor [Flavoring Agent]     Reaction unknown    Medications Prior to Admission  Medication Sig Dispense Refill  . aspirin EC 81 MG tablet Take 81 mg by mouth daily.    Marland Kitchen atorvastatin  (LIPITOR) 20 MG tablet Take 20 mg by mouth daily.    . calcium-vitamin D (OSCAL WITH D) 500-200 MG-UNIT tablet Take 1 tablet by mouth.    . cholecalciferol (VITAMIN D) 400 units TABS tablet Take 1,000 Units by mouth daily.     Marland Kitchen escitalopram (LEXAPRO) 5 MG tablet Take 5 mg by mouth daily.    . furosemide (LASIX) 20 MG tablet Take 20 mg by mouth daily.     . hydrALAZINE (APRESOLINE) 25 MG tablet Take 25 mg by mouth 3 (three) times daily.    . isosorbide mononitrate (IMDUR) 30 MG 24 hr tablet Take 30 mg by mouth daily.    . K Phos Mono-Sod Phos Di & Mono (PHOSPHA 250 NEUTRAL) 155-852-130 MG TABS Take 1 tablet by mouth daily.     Marland Kitchen loperamide (IMODIUM A-D) 2 MG tablet Take 2 mg by mouth every 6 (six) hours as needed for diarrhea or loose stools.    Marland Kitchen omeprazole (PRILOSEC) 40 MG capsule Take 1 capsule (40 mg total) by mouth daily. 90 capsule 3  . oxyCODONE-acetaminophen (ROXICET) 5-325 MG tablet Take 1 tablet by mouth every 6 (six) hours as needed for severe pain. 30 tablet 0       JYN:WGNFA from the symptoms mentioned above,there are no other symptoms referable to all systems reviewed.  Marland Kitchen  stroke: mapping our early stages of recovery book   Does not apply Once  . aspirin  300 mg Rectal Daily   Or  . aspirin  325 mg Oral Daily  . atorvastatin  20 mg Oral q1800  . cholecalciferol  1,000 Units Oral Daily  . citalopram  10 mg Oral Daily  . enoxaparin (LOVENOX) injection  40 mg Subcutaneous Q24H  . pantoprazole  40 mg Oral Daily      Physical Exam: Vital signs in last 24 hours: Temp:  [97.9 F (36.6 C)-98.6 F (37 C)] 98.4 F (36.9 C) (05/22 0700) Pulse Rate:  [68-82] 68 (05/22 0700) Resp:  [10-19] 14 (05/22 0700) BP: (112-145)/(56-80) 119/61 (05/22 0700) SpO2:  [94 %-100 %] 100 % (05/22 0700) Weight:  [74 lb 8.3 oz (33.8 kg)] 74 lb 8.3 oz (33.8 kg) (05/22 0510) Weight change:  Last BM Date: 07/12/17  Intake/Output from previous day: 05/21 0701 - 05/22 0700 In: 120  [P.O.:120] Out: -  No intake/output data recorded.   Physical Exam: General- pt is awake,alert,follow commands Resp- No acute REsp distress, CTA B/L NO Rhonchi CVS- S1S2 regular in rate and rhythm GIT- BS+, soft, NT, ND EXT- NO LE Edema, Cyanosis Psych- normal mood and affect Access- AVF   Lab Results: CBC Recent Labs    07/13/17 1539 07/14/17 0432  WBC 5.8 6.1  HGB 10.4* 10.2*  HCT 32.0* 32.4*  PLT 174 167    BMET Recent Labs    07/13/17 1539 07/14/17 0432  NA 134* 139  K 3.4* 3.5  CL 98* 99*  CO2 27 28  GLUCOSE 85 80  BUN 16 19  CREATININE 3.56* 4.16*  CALCIUM 8.2* 8.4*    MICRO Recent Results (from the past 240 hour(s))  MRSA PCR Screening     Status: Abnormal   Collection Time: 07/13/17 11:08 PM  Result Value Ref Range Status   MRSA by PCR INVALID RESULTS, SPECIMEN SENT FOR CULTURE (A) NEGATIVE Final    Comment:        The GeneXpert MRSA Assay (FDA approved for NASAL specimens only), is one component of a comprehensive MRSA colonization surveillance program. It is not intended to diagnose MRSA infection nor to guide or monitor treatment for MRSA infections. RESULT CALLED TO, READ BACK BY AND VERIFIED WITH: ROBERTS T. AT [id number] ON [id number] BY THOMPSON S. Performed at [facility], [address], [place], [place] [address]       Lab Results  Component Value Date   CALCIUM 8.4 (L) 07/14/2017   PHOS 5.8 (H) 07/14/2017      Impression: 1)Renal ESRD on HD               Pt is on Tuesday/Thursday/Saturday schedule               Pt was last dialyzed yesterday  2)HTN BP stable   3)Anemia IN ESRD the goal for HGb is 9--11.   4)CKD Mineral-Bone Disorder Phosphorus at goal. Calcium is at goal.  5)CNS-admitted with CVA  Primary MD following  6)Electrolytes Normokalemic NOrmonatremic   7)Acid base Co2 at goal     Plan:  Will dialyze in am  No need for Hd today    Ermagene Saidi S 07/14/2017, 10:47 AM

## 2017-07-14 NOTE — Evaluation (Addendum)
Physical Therapy Evaluation Patient Details Name: Courtney [name] MRN: [id number] DOB: 1940/07/20 Today's Date: 07/14/2017   History of Present Illness  Courtney [name] is a 77 y.o. female with medical history significant of unspecified CHF, CAD, history of MI, type 2 diabetes, ESRD hemodialysis, hyperlipidemia, hypertension, history of CVA with residual right-sided weakness who is coming to the emergency department referred from the dialysis suite due to right facial droop and body leaning to the right.  Her son just saw her earlier in the ER as stated that he was not much different from her baseline normal.  However, he mentioned she has been weak for several days.    Clinical Impression  Patient functioning near baseline for functional mobility and gait, limited to a few steps at bedside due to poor standing balance with BLE weakness, unable to grip RW with right hand and functioning close to baseline per patient's son.  Patient's son states he feels comfortable to taking care of patient and declines HHPT.  Patient will benefit from continued physical therapy in hospital to increase strength, balance, endurance for safe ADLs and transfers.  Patient's son requested HEP secondary to declining HHPT follow up and instructed in ROM/strengnthening exercises to have patient do daily 2x/day for up to 10 repetitions or as tolerated with understanding acknowledged and written instructions provided.     Follow Up Recommendations Supervision/Assistance - 24 hour;No PT follow up;Supervision for mobility/OOB    Equipment Recommendations  None recommended by PT    Recommendations for Other Services       Precautions / Restrictions Precautions Precautions: Fall Restrictions Weight Bearing Restrictions: No      Mobility  Bed Mobility Overal bed mobility: Needs Assistance Bed Mobility: Supine to Sit;Sit to Supine Rolling: Min assist   Supine to sit: Min assist        Transfers Overall transfer level:  Needs assistance Equipment used: 1 person hand held assist Transfers: Sit to/from [facility] Sit to Stand: Mod assist Stand pivot transfers: Mod assist;Max assist       General transfer comment: required hand held assist due to poor RUE grip strength  Ambulation/Gait Ambulation/Gait assistance: Mod assist;Max assist Ambulation Distance (Feet): 3 Feet Assistive device: 1 person hand held assist Gait Pattern/deviations: Decreased step length - right;Decreased step length - left;Decreased stride length Gait velocity: slow   General Gait Details: limited to 3-4 unsteady labored steps due to BLE weakness  Stairs            Wheelchair Mobility    Modified Rankin (Stroke Patients Only)       Balance Overall balance assessment: Needs assistance Sitting-balance support: Feet supported;No upper extremity supported Sitting balance-Leahy Scale: Fair     Standing balance support: No upper extremity supported;During functional activity Standing balance-Leahy Scale: Poor Standing balance comment: poor using RW                             Pertinent Vitals/Pain Pain Assessment: No/denies pain    Home Living Family/patient expects to be discharged to:: Private residence Living Arrangements: Children(son) Available Help at Discharge: Family Type of Home: Mobile home Home Access: Stairs to enter Entrance Stairs-Rails: Right Entrance Stairs-Number of Steps: 4-5 Home Layout: One level Home Equipment: Walker - 2 wheels;Cane - single point;Wheelchair - manual;Bedside commode Additional Comments: all information per patient    Prior Function Level of Independence: Needs assistance   Gait / Transfers Assistance Needed: patient assisted  for 4-5 steps mostly for transfers and going up/down steps per patient's son  ADL's / Homemaking Assistance Needed: assisted by son        Hand Dominance   Dominant Hand: Left    Extremity/Trunk Assessment    Upper Extremity Assessment Upper Extremity Assessment: Defer to OT evaluation    Lower Extremity Assessment Lower Extremity Assessment: Generalized weakness;RLE deficits/detail;LLE deficits/detail RLE Deficits / Details: grossly -3/5 LLE Deficits / Details: grossly 3+/5    Cervical / Trunk Assessment Cervical / Trunk Assessment: Normal  Communication   Communication: No difficulties  Cognition Arousal/Alertness: Awake/alert Behavior During Therapy: WFL for tasks assessed/performed Overall Cognitive Status: History of cognitive impairments - at baseline                                 General Comments: pt confused, unable to provide accurate PLOF history      General Comments      Exercises     Assessment/Plan    PT Assessment Patient needs continued PT services  PT Problem List Decreased strength;Decreased activity tolerance;Decreased balance;Decreased mobility       PT Treatment Interventions Gait training;Functional mobility training;Therapeutic activities;Therapeutic exercise;Stair training;Patient/family education    PT Goals (Current goals can be found in the Care Plan section)  Acute Rehab PT Goals Patient Stated Goal: return  home PT Goal Formulation: With patient/family Time For Goal Achievement: 07/28/17 Potential to Achieve Goals: Good    Frequency Min 3X/week   Barriers to discharge        Co-evaluation               AM-PAC PT "6 Clicks" Daily Activity  Outcome Measure Difficulty turning over in bed (including adjusting bedclothes, sheets and blankets)?: A Lot Difficulty moving from lying on back to sitting on the side of the bed? : A Lot Difficulty sitting down on and standing up from a chair with arms (e.g., wheelchair, bedside commode, etc,.)?: A Lot Help needed moving to and from a bed to chair (including a wheelchair)?: A Lot Help needed walking in hospital room?: A Lot Help needed climbing 3-5 steps with a railing? :  Total 6 Click Score: 11    End of Session   Activity Tolerance: Patient limited by fatigue Patient left: in bed;with call bell/phone within reach;with bed alarm set Nurse Communication: Mobility status PT Visit Diagnosis: Unsteadiness on feet (R26.81);Other abnormalities of gait and mobility (R26.89);Muscle weakness (generalized) (M62.81)    Time: 0017-4944 PT Time Calculation (min) (ACUTE ONLY): 20 min   Charges:   PT Evaluation $PT Eval Moderate Complexity: 1 Mod PT Treatments $Therapeutic Activity: 8-22 mins   PT G Codes:        12:24 PM, 2017-07-22 Lonell Grandchild, MPT Physical Therapist with [facility] [phone] office [phone] mobile phone

## 2017-07-14 NOTE — Evaluation (Addendum)
Occupational Therapy Evaluation Patient Details Name: Courtney [name] MRN: [id number] DOB: August 07, 1940 Today's Date: 07/14/2017    History of Present Illness Courtney [name] is a 77 y.o. female with medical history significant of unspecified CHF, CAD, history of MI, type 2 diabetes, ESRD hemodialysis, hyperlipidemia, hypertension, history of CVA with residual right-sided weakness who is coming to the emergency department referred from the dialysis suite due to right facial droop and body leaning to the right.  Her son just saw her earlier in the ER as stated that he was not much different from her baseline normal.  However, he mentioned she has been weak for several days.   Clinical Impression   Pt received supine in bed, agreeable to OT evaluation. Pt unable to provide accurate PLOF information as she contradicts herself several times during session. Pt requiring assistance with all ADLs and mobility tasks, suspect she requires assistance at baseline as well, however no family present to determine amount typically required. Pt unable to perform functional mobility, max A for transfer to chair. Pt uses left hand as dominant due to hx of CVA with residual right hemiparesis. If family is near baseline and family is able to manage pt at home recommend [facility] services to determine pt functioning in home environment and improve safety and independence for both pt and caregivers. If family unable to provide necessary level of physical assistance recommend SNF on discharge.     Follow Up Recommendations  Home health OT; SNF    Equipment Recommendations  None recommended by OT       Precautions / Restrictions Precautions Precautions: Fall Restrictions Weight Bearing Restrictions: No      Mobility Bed Mobility Overal bed mobility: Needs Assistance Bed Mobility: Rolling;Supine to Sit Rolling: Min assist   Supine to sit: Min assist        Transfers Overall transfer level: Needs assistance Equipment  used: 1 person hand held assist Transfers: Sit to/from [facility] Sit to Stand: Mod assist Stand pivot transfers: Max assist       General transfer comment: pt unable to grasp/use RW        ADL either performed or assessed with clinical judgement   ADL Overall ADL's : Needs assistance/impaired Eating/Feeding: Set up;Sitting Eating/Feeding Details (indicate cue type and reason): Pt uses left hand for feeding once plate set-up and food prepared.                                          Vision Patient Visual Report: No change from baseline Additional Comments: unable to formallly assess due to confusion. Pt vision appears functional            Pertinent Vitals/Pain Pain Assessment: No/denies pain     Hand Dominance Left   Extremity/Trunk Assessment Upper Extremity Assessment Upper Extremity Assessment: RUE deficits/detail;LUE deficits/detail RUE Deficits / Details: spastic hemiplegia/hemiparesis. shoulder A/ROM at 50%, decreased grip strength and functional use RUE Coordination: decreased fine motor;decreased gross motor LUE Deficits / Details: strength grossly 3+/5 LUE Sensation: WNL LUE Coordination: WNL   Lower Extremity Assessment Lower Extremity Assessment: Defer to PT evaluation       Communication Communication Communication: No difficulties   Cognition Arousal/Alertness: Awake/alert Behavior During Therapy: WFL for tasks assessed/performed Overall Cognitive Status: No family/caregiver present to determine baseline cognitive functioning  General Comments: pt confused, unable to provide accurate PLOF history              Home Living Family/patient expects to be discharged to:: Private residence Living Arrangements: Children(son and dtr-in-law) Available Help at Discharge: Family                             Additional Comments: Pt unable to provide consistent  PLOF information, no family available      Prior Functioning/Environment Level of Independence: Needs assistance  Gait / Transfers Assistance Needed: unsure-pt reporting she does not use wheelchair, also reports does not walk ADL's / Homemaking Assistance Needed: son assisting with ADLs            OT Problem List: Decreased strength;Decreased activity tolerance;Impaired balance (sitting and/or standing);Decreased cognition;Decreased safety awareness;Impaired UE functional use      OT Treatment/Interventions: Self-care/ADL training;Therapeutic exercise;Neuromuscular education;Therapeutic activities;Patient/family education    OT Goals(Current goals can be found in the care plan section) Acute Rehab OT Goals Patient Stated Goal: none stated OT Goal Formulation: Patient unable to participate in goal setting Time For Goal Achievement: 07/28/17 Potential to Achieve Goals: Good  OT Frequency: Min 2X/week    End of Session Equipment Utilized During Treatment: Gait belt Nurse Communication: Mobility status;Other (comment)(chair alarm set)  Activity Tolerance: Patient tolerated treatment well Patient left: in chair;with call bell/phone within reach;with chair alarm set  OT Visit Diagnosis: Muscle weakness (generalized) (M62.81)                Time: 2876-8115 OT Time Calculation (min): 36 min Charges:  OT General Charges $OT Visit: 1 Visit OT Evaluation $OT Eval Moderate Complexity: Gardner, OTR/L  [phone] 07/14/2017, 8:27 AM

## 2017-07-14 NOTE — Discharge Summary (Signed)
Physician Discharge Summary  Courtney [name] JXB:[id number] DOB: Jul 23, 1940 DOA: 07/13/2017  PCP: Neale Burly, MD  Admit date: 07/13/2017 Discharge date: 07/14/2017  Admitted From: Home Disposition: Home  Recommendations for Outpatient Follow-up:  1. Follow up with PCP in 1-2 weeks 2. Please obtain BMP/CBC in one week 3. Please follow up with the neurologist in 1 to 2 weeks to see if he needs to continue to take the clopidogrel.  Home Health: Yes resume home health Equipment/Devices: None  Discharge Condition: Stable CODE STATUS: Full Diet recommendation: Renal fluid restricted diet  Brief/Interim Summary:  #) Right-sided weakness: Patient was admitted with acute on chronic right-sided weakness from dialysis.  MRI imaging showed a possible new lacunar stroke that was subacute in the right centrum semi-ovale but no large stroke which would explain her deficits.  Suspect this was most likely secondary to recrudescence.  Neurology was consulted and the case was discussed over the phone.  It was decided to start the patient on clopidogrel as well as aspirin 81 mg and then follow-up as an outpatient with neurology to see if she would continue or discontinue after a certain period of time.  She was evaluated by PT and OT who felt that she could safely go home with home health physical therapy as the patient's son did provide her with care.  She was discharged in with the same neurological status she had prior to admission.  She was allowed permissive hypertension during 24 hours of her stay.  She was continued on a statin.  #) ESRD on Tuesday Thursday Saturday dialysis: Nephrology was consulted.  Patient was continued on routine dialysis schedule.  She was continued on Phospha neutral and Os-Cal and vitamin D.  #) Hypertension/hyperlipidemia: Patient was continued on atorvastatin 20 mg daily.  Patient's isosorbide mononitrate 30 mg and hydralazine 25 mg 3 times daily and furosemide 20 mg daily were  held for 24 hours in the setting of permissive hypertension restarted on discharge.  Discharge Diagnoses:  Principal Problem:   Acute CVA (cerebrovascular accident) ([place]) Active Problems:   HTN (hypertension)   ESRD on dialysis ([facility])   Hypokalemia   Anemia in ESRD (end-stage renal disease) ([place])   Hyperlipidemia   Coronary artery disease   Type 2 diabetes mellitus [facility])    Discharge Instructions  Discharge Instructions    Call MD for:  difficulty breathing, headache or visual disturbances   Complete by:  As directed    Call MD for:  hives   Complete by:  As directed    Call MD for:  persistant nausea and vomiting   Complete by:  As directed    Call MD for:  redness, tenderness, or signs of infection (pain, swelling, redness, odor or green/yellow discharge around incision site)   Complete by:  As directed    Call MD for:  severe uncontrolled pain   Complete by:  As directed    Call MD for:  temperature >100.4   Complete by:  As directed    Diet - low sodium heart healthy   Complete by:  As directed    Discharge instructions   Complete by:  As directed    Please follow-up with your primary care doctor in 1 to 2 weeks.  Please follow-up with the neurologist in 1 to 2 weeks to see if you still need to take the Plavix.  Please continue take the Plavix until told otherwise.   Increase activity slowly   Complete by:  As directed  Allergies as of 07/14/2017      Reactions   Grapefruit Flavor [flavoring Agent]    Reaction unknown      Medication List    TAKE these medications   aspirin EC 81 MG tablet Take 81 mg by mouth daily.   atorvastatin 20 MG tablet Commonly known as:  LIPITOR Take 20 mg by mouth daily.   calcium-vitamin D 500-200 MG-UNIT tablet Commonly known as:  OSCAL WITH D Take 1 tablet by mouth.   cholecalciferol 400 units Tabs tablet Commonly known as:  VITAMIN D Take 1,000 Units by mouth daily.   clopidogrel 75 MG tablet Commonly known as:   PLAVIX Take 1 tablet (75 mg total) by mouth daily.   escitalopram 5 MG tablet Commonly known as:  LEXAPRO Take 5 mg by mouth daily.   furosemide 20 MG tablet Commonly known as:  LASIX Take 20 mg by mouth daily.   hydrALAZINE 25 MG tablet Commonly known as:  APRESOLINE Take 25 mg by mouth 3 (three) times daily.   isosorbide mononitrate 30 MG 24 hr tablet Commonly known as:  IMDUR Take 30 mg by mouth daily.   loperamide 2 MG tablet Commonly known as:  IMODIUM A-D Take 2 mg by mouth every 6 (six) hours as needed for diarrhea or loose stools.   omeprazole 40 MG capsule Commonly known as:  PRILOSEC Take 1 capsule (40 mg total) by mouth daily.   oxyCODONE-acetaminophen 5-325 MG tablet Commonly known as:  ROXICET Take 1 tablet by mouth every 6 (six) hours as needed for severe pain.   PHOSPHA 250 NEUTRAL 155-852-130 MG Tabs Take 1 tablet by mouth daily.       Allergies  Allergen Reactions  . Grapefruit Flavor [Flavoring Agent]     Reaction unknown    Consultations:  Nephrology  Neurology   Procedures/Studies: Dg Chest 2 View  Result Date: 07/14/2017 CLINICAL DATA:  Acute CVA. EXAM: CHEST - 2 VIEW COMPARISON:  07/13/2017. FINDINGS: Patient is rotated. Heart is enlarged. Improving perihilar airspace opacification. Moderate left pleural effusion and left basilar atelectasis persist. There may be minimal subsegmental atelectasis in the medial right lower lobe. Small right pleural effusion. Osteopenia. IMPRESSION: Improving congestive heart failure with persistent left lower lobe collapse/consolidation. Electronically Signed   By: Lorin Picket M.D.   On: 07/14/2017 10:44   Dg Chest 2 View  Result Date: 07/13/2017 CLINICAL DATA:  Weakness EXAM: CHEST - 2 VIEW COMPARISON:  05/31/2017 FINDINGS: Cardiomegaly with moderate interstitial edema, most prominent in the left perihilar region. Moderate left and small right pleural effusions.  No pneumothorax. When compared to the  prior, this is mildly increased. IMPRESSION: Cardiomegaly with mild interstitial edema. Moderate left and small right pleural effusions. Electronically Signed   By: Julian Hy M.D.   On: 07/13/2017 16:43   Mr Jodene Nam Head Wo Contrast  Result Date: 07/13/2017 CLINICAL DATA:  Right facial droop and leaning after dialysis. EXAM: MRI HEAD WITHOUT CONTRAST MRA HEAD WITHOUT CONTRAST TECHNIQUE: Multiplanar, multiecho pulse sequences of the brain and surrounding structures were obtained without intravenous contrast. Angiographic images of the head were obtained using MRA technique without contrast. COMPARISON:  Head CT 05/31/2017. FINDINGS: MRI HEAD FINDINGS Brain: 6 mm area of DWI hyperintensity in the central right corona radiata is isointense on ADC map. Advanced chronic small vessel disease with confluent gliosis in the cerebral white matter. Remote lacunar infarcts in the deep gray nuclei, especially bilateral thalamus. Moderate remote right cerebellar infarct. Small remote lateral left  frontal cortex infarct. No acute hemorrhage, hydrocephalus, masslike finding, or collection. Generalized atrophy. Vascular: Arterial findings below. Normal dural venous sinus flow voids. Skull and upper cervical spine: No evidence of marrow lesion Sinuses/Orbits: Bilateral cataract resection. MRA HEAD FINDINGS Limited by motion. The non dominant left vertebral artery has poor flow, possibly from a stenosis in the neck. High-grade narrowing of the right cavernous ICA at the posterior and anterior genu. Nonvisualized right A1 segment, likely developmental given the robust anterior communicating artery. There is atherosclerotic irregularity of bilateral medium size vessels. Apparent high-grade narrowing in the distal left M1 segment and right M2 branches. IMPRESSION: Brain MRI: 1. Lacunar infarct in the right centrum semiovale with subacute appearance. No acute infarct in the left hemisphere or brainstem to explain right-sided  deficits. 2. Severe chronic ischemic injury as described. MRA: 1. Extensive atheromatous irregularity accentuated by motion. 2. Tandem high-grade right cavernous narrowing. The right MCA is isolated due to circle-of-Willis anatomy. 3. Left M1 and multifocal right M2 apparent high-grade narrowing. 4. Poor flow in the non dominant left vertebral artery which may be from flow limiting stenosis in the neck. Electronically Signed   By: Monte Fantasia M.D.   On: 07/13/2017 16:43   Mr Brain Wo Contrast  Result Date: 07/13/2017 CLINICAL DATA:  Right facial droop and leaning after dialysis. EXAM: MRI HEAD WITHOUT CONTRAST MRA HEAD WITHOUT CONTRAST TECHNIQUE: Multiplanar, multiecho pulse sequences of the brain and surrounding structures were obtained without intravenous contrast. Angiographic images of the head were obtained using MRA technique without contrast. COMPARISON:  Head CT 05/31/2017. FINDINGS: MRI HEAD FINDINGS Brain: 6 mm area of DWI hyperintensity in the central right corona radiata is isointense on ADC map. Advanced chronic small vessel disease with confluent gliosis in the cerebral white matter. Remote lacunar infarcts in the deep gray nuclei, especially bilateral thalamus. Moderate remote right cerebellar infarct. Small remote lateral left frontal cortex infarct. No acute hemorrhage, hydrocephalus, masslike finding, or collection. Generalized atrophy. Vascular: Arterial findings below. Normal dural venous sinus flow voids. Skull and upper cervical spine: No evidence of marrow lesion Sinuses/Orbits: Bilateral cataract resection. MRA HEAD FINDINGS Limited by motion. The non dominant left vertebral artery has poor flow, possibly from a stenosis in the neck. High-grade narrowing of the right cavernous ICA at the posterior and anterior genu. Nonvisualized right A1 segment, likely developmental given the robust anterior communicating artery. There is atherosclerotic irregularity of bilateral medium size  vessels. Apparent high-grade narrowing in the distal left M1 segment and right M2 branches. IMPRESSION: Brain MRI: 1. Lacunar infarct in the right centrum semiovale with subacute appearance. No acute infarct in the left hemisphere or brainstem to explain right-sided deficits. 2. Severe chronic ischemic injury as described. MRA: 1. Extensive atheromatous irregularity accentuated by motion. 2. Tandem high-grade right cavernous narrowing. The right MCA is isolated due to circle-of-Willis anatomy. 3. Left M1 and multifocal right M2 apparent high-grade narrowing. 4. Poor flow in the non dominant left vertebral artery which may be from flow limiting stenosis in the neck. Electronically Signed   By: Monte Fantasia M.D.   On: 07/13/2017 16:43   US Carotid Bilateral (at [facility] Only)  Result Date: 07/14/2017 CLINICAL DATA:  Altered mental status. Hypertension, hyperlipidemia, diabetes. EXAM: BILATERAL CAROTID DUPLEX ULTRASOUND TECHNIQUE: Pearline Cables scale imaging, color Doppler and duplex ultrasound was performed of bilateral carotid and vertebral arteries in the neck. Technologist describes technically difficult scan secondary to patient's altered mental status, inability to hold still resulting in motion artifact.  COMPARISON:  07/18/2012 TECHNIQUE: Quantification of carotid stenosis is based on velocity parameters that correlate the residual internal carotid diameter with NASCET-based stenosis levels, using the diameter of the distal internal carotid lumen as the denominator for stenosis measurement. The following velocity measurements were obtained: PEAK SYSTOLIC/END DIASTOLIC RIGHT ICA:                     39/10cm/sec CCA:                     96/0AV/WUJ SYSTOLIC ICA/CCA RATIO:  0.7 ECA:                     72cm/sec LEFT ICA:                     73/16cm/sec CCA:                     81/1BJ/YNW SYSTOLIC ICA/CCA RATIO:  1.4 ECA:                     58cm/sec FINDINGS: RIGHT CAROTID ARTERY: Partially calcified nonocclusive  plaque in the carotid bulb extending to the ICA origin and into proximal external carotid artery. Normal waveforms and color Doppler signal. Distal ICA tortuous. RIGHT VERTEBRAL ARTERY:  Normal flow direction and waveform. LEFT CAROTID ARTERY: Eccentric calcified plaque in the bulb and proximal ICA resulting in only mild stenosis. Normal waveforms and color Doppler signal. Mild tortuosity distally. LEFT VERTEBRAL ARTERY: Normal flow direction and waveform. IMPRESSION: 1. Bilateral carotid bifurcation and proximal ICA plaque resulting in less than 50% diameter stenosis. 2.  Antegrade bilateral vertebral arterial flow. Electronically Signed   By: Lucrezia Europe M.D.   On: 07/14/2017 10:43       Subjective:   Discharge Exam: Vitals:   07/14/17 0700 07/14/17 1100  BP: 119/61 (!) 124/53  Pulse: 68 72  Resp: 14 16  Temp: 98.4 F (36.9 C) 98.8 F (37.1 C)  SpO2: 100% 99%   Vitals:   07/14/17 0300 07/14/17 0510 07/14/17 0700 07/14/17 1100  BP: 112/76 (!) 130/56 119/61 (!) 124/53  Pulse: 68 71 68 72  Resp:  16 14 16   Temp: 98.5 F (36.9 C) 97.9 F (36.6 C) 98.4 F (36.9 C) 98.8 F (37.1 C)  TempSrc:  Oral Oral Oral  SpO2: 96% 100% 100% 99%  Weight:  33.8 kg (74 lb 8.3 oz)    Height:        General: Pt is alert, awake, not in acute distress Cardiovascular: Regular rate and rhythm, 2 out of 6 referred murmur from right upper extremity fistula, Respiratory: CTA bilaterally, no wheezing, no rhonchi, diminished lung sounds at bases Abdominal: Soft, NT, ND, bowel sounds + Extremities: no edema, right upper extremity fistula with palpable thrill and bruit Neuro: Dense right-sided hemiparesis unchanged from prior    The results of significant diagnostics from this hospitalization (including imaging, microbiology, ancillary and laboratory) are listed below for reference.     Microbiology: Recent Results (from the past 240 hour(s))  MRSA PCR Screening     Status: Abnormal   Collection  Time: 07/13/17 11:08 PM  Result Value Ref Range Status   MRSA by PCR INVALID RESULTS, SPECIMEN SENT FOR CULTURE (A) NEGATIVE Final    Comment:        The GeneXpert MRSA Assay (FDA approved for NASAL specimens only), is one component of a comprehensive MRSA colonization surveillance program. It  is not intended to diagnose MRSA infection nor to guide or monitor treatment for MRSA infections. RESULT CALLED TO, READ BACK BY AND VERIFIED WITH: ROBERTS T. AT [id number] ON [id number] BY THOMPSON S. Performed at [facility], [address] [place], [place] [address]      Labs: BNP (last 3 results) No results for input(s): BNP in the last 8760 hours. Basic Metabolic Panel: Recent Labs  Lab 07/13/17 1539 07/13/17 1933 07/14/17 0432  NA 134*  --  139  K 3.4*  --  3.5  CL 98*  --  99*  CO2 27  --  28  GLUCOSE 85  --  80  BUN 16  --  19  CREATININE 3.56*  --  4.16*  CALCIUM 8.2*  --  8.4*  MG  --  1.7  --   PHOS  --  5.2* 5.8*   Liver Function Tests: Recent Labs  Lab 07/13/17 1539 07/14/17 0432  AST 21  --   ALT 15  --   ALKPHOS 38  --   BILITOT 1.1  --   PROT 5.7*  --   ALBUMIN 2.6* 2.6*   No results for input(s): LIPASE, AMYLASE in the last 168 hours. No results for input(s): AMMONIA in the last 168 hours. CBC: Recent Labs  Lab 07/13/17 1539 07/14/17 0432  WBC 5.8 6.1  NEUTROABS 4.1  --   HGB 10.4* 10.2*  HCT 32.0* 32.4*  MCV 95.8 97.9  PLT 174 167   Cardiac Enzymes: Recent Labs  Lab 07/13/17 1548 07/13/17 2146 07/14/17 0432  TROPONINI 0.06* 0.06* 0.06*   BNP: Invalid input(s): POCBNP CBG: Recent Labs  Lab 07/13/17 2330 07/14/17 0741 07/14/17 1140  GLUCAP 113* 79 89   D-Dimer No results for input(s): DDIMER in the last 72 hours. Hgb A1c No results for input(s): HGBA1C in the last 72 hours. Lipid Profile Recent Labs    07/14/17 0432  CHOL 116  HDL 61  LDLCALC 40  TRIG 73  CHOLHDL 1.9   Thyroid function studies No results for input(s): TSH,  T4TOTAL, T3FREE, THYROIDAB in the last 72 hours.  Invalid input(s): FREET3 Anemia work up No results for input(s): VITAMINB12, FOLATE, FERRITIN, TIBC, IRON, RETICCTPCT in the last 72 hours. Urinalysis    Component Value Date/Time   COLORURINE YELLOW 07/17/2012 1751   APPEARANCEUR CLOUDY (A) 07/17/2012 1751   LABSPEC 1.020 07/17/2012 1751   PHURINE 5.5 07/17/2012 1751   GLUCOSEU NEGATIVE 07/17/2012 1751   HGBUR SMALL (A) 07/17/2012 1751   BILIRUBINUR NEGATIVE 07/17/2012 1751   KETONESUR NEGATIVE 07/17/2012 1751   PROTEINUR 30 (A) 07/17/2012 1751   UROBILINOGEN 0.2 07/17/2012 1751   NITRITE NEGATIVE 07/17/2012 1751   LEUKOCYTESUR SMALL (A) 07/17/2012 1751   Sepsis Labs Invalid input(s): PROCALCITONIN,  WBC,  LACTICIDVEN Microbiology Recent Results (from the past 240 hour(s))  MRSA PCR Screening     Status: Abnormal   Collection Time: 07/13/17 11:08 PM  Result Value Ref Range Status   MRSA by PCR INVALID RESULTS, SPECIMEN SENT FOR CULTURE (A) NEGATIVE Final    Comment:        The GeneXpert MRSA Assay (FDA approved for NASAL specimens only), is one component of a comprehensive MRSA colonization surveillance program. It is not intended to diagnose MRSA infection nor to guide or monitor treatment for MRSA infections. RESULT CALLED TO, READ BACK BY AND VERIFIED WITH: ROBERTS T. AT [id number] ON [id number] BY THOMPSON S. Performed at [facility], [address], [place],  [place] [address]      Time coordinating discharge: Over 30 minutes  SIGNED:   Cristy Folks, MD  [facility] 07/14/2017, 12:19 PM   If 7PM-7AM, please contact night-coverage [email or web address] Password [email or web address]

## 2017-07-14 NOTE — Discharge Instructions (Signed)
Dialysis Diet Dialysis is a treatment that cleans your blood. It is used when the kidneys are damaged. When you need dialysis, you should watch your diet. This is because some nutrients can build up in your blood between treatments and make you sick. These nutrients are:  Potassium.  Phosphorus.  Sodium.  Your doctor or dietitian will:  Tell you how much of these you can have.  Tell you if you need to look out for other nutrients too.  Help you plan meals.  Tell you how much to drink each day.  What do I need to know about this diet?  Limit potassium. Potassium is in milk, fruits, and vegetables.  Limit phosphorus. Phosphorus is in milk, cheese, beans, nuts, and carbonated beverages.  Limit salt (sodium). Foods that have a lot sodium include processed and cured meats, ready-made frozen meals, canned vegetables, and salty snack foods.  Do not use salt substitutes.  Try not to eat whole-grain foods and foods that have a lot of fiber.  Follow your doctor's instructions about how much to drink. You may be told to: ? Write down what you drink. ? Write down foods you eat that are made mostly from water, such as gelatin and soups. ? Drink from small cups.  Ask your doctor if you should take a medicine that binds phosphorus.  Take vitamin and mineral supplements only as told by your doctor.  Eat meat, poultry, fish, and eggs. Limit nuts and beans.  Before you cook potatoes, cut them into small pieces. Then boil them in unsalted water.  Drain all fluid from cooked vegetables and canned fruits before you eat them. What foods can I eat? Grains White bread. White rice. Cooked cereal. Unsalted popcorn. Tortillas. Pasta. Vegetables Fresh or frozen broccoli, carrots, and green beans. Cabbage. Cauliflower. Celery. Cucumbers. Eggplant. Radishes. Zucchini. Fruits Apples. Fresh or frozen berries. Fresh or canned pears, peaches, and pineapple. Grapes. Plums. Meats and Other  Protein Sources Fresh or frozen beef, pork, chicken, and fish. Eggs. Low-sodium canned tuna or salmon. Dairy Cream cheese. Heavy cream. Ricotta cheese. Beverages Apple cider. Cranberry juice. Grape juice. Lemonade. Black coffee. Condiments Herbs. Spices. Jam and jelly. Honey. Sweets and Desserts Sherbet. Cakes. Cookies. Fats and Oils Olive oil, canola oil, and safflower oil. Other Non-dairy creamer. Non-dairy whipped topping. Homemade broth without salt. The items listed above may not be a complete list of recommended foods or beverages. Contact your dietitian for more options. What foods are not recommended? Grains Whole-grain bread. Whole-grain pasta. High-fiber cereal. Vegetables Potatoes. Beets. Tomatoes. Winter squash and pumpkin. Asparagus. Spinach. Parsnips. Fruits Star fruit. Bananas. Oranges. Kiwi. Nectarines. Prunes. Melon. Dried fruit. Avocado. Meats and Other Protein Sources Canned, smoked, and cured meats. Soil scientist. Sardines. Nuts and seeds. Peanut butter. Beans and legumes. Dairy Milk. Buttermilk. Yogurt. Cheese and cottage cheese. Processed cheese spreads. Beverages Orange juice. Prune juice. Carbonated soft drinks. Condiments Salt. Salt substitutes. Soy sauce. Sweets and Desserts Ice cream. Chocolate. Candied nuts. Fats and Oils Butter. Margarine. Other Ready-made frozen meals. Canned soups. The items listed above may not be a complete list of foods and beverages to avoid. Contact your dietitian for more information. This information is not intended to replace advice given to you by your health care provider. Make sure you discuss any questions you have with your health care provider. Document Released: 08/11/2011 Document Revised: 07/18/2015 Document Reviewed: 09/12/2013 Elsevier Interactive Patient Education  2018 [employer]. Stroke Prevention Some health problems and behaviors may make  it more likely for you to have a stroke. Below are ways  to lessen your risk of having a stroke.  Be active for at least 30 minutes on most or all days.  Do not smoke. Try not to be around others who smoke.  Do not drink too much alcohol. ? Do not have more than 2 drinks a day if you are a man. ? Do not have more than 1 drink a day if you are a woman and are not pregnant.  Eat healthy foods, such as fruits and vegetables. If you were put on a specific diet, follow the diet as told.  Keep your cholesterol levels under control through diet and medicines. Look for foods that are low in saturated fat, trans fat, cholesterol, and are high in fiber.  If you have diabetes, follow all diet plans and take your medicine as told.  Ask your doctor if you need treatment to lower your blood pressure. If you have high blood pressure (hypertension), follow all diet plans and take your medicine as told by your doctor.  If you are 64-53 years old, have your blood pressure checked every 3-5 years. If you are age 22 or older, have your blood pressure checked every year.  Keep a healthy weight. Eat foods that are low in calories, salt, saturated fat, trans fat, and cholesterol.  Do not take drugs.  Avoid birth control pills, if this applies. Talk to your doctor about the risks of taking birth control pills.  Talk to your doctor if you have sleep problems (sleep apnea).  Take all medicine as told by your doctor. ? You may be told to take aspirin or blood thinner medicine. Take this medicine as told by your doctor. ? Understand your medicine instructions.  Make sure any other conditions you have are being taken care of.  Get help right away if:  You suddenly lose feeling (you feel numb) or have weakness in your face, arm, or leg.  Your face or eyelid hangs down to one side.  You suddenly feel confused.  You have trouble talking (aphasia) or understanding what people are saying.  You suddenly have trouble seeing in one or both eyes.  You suddenly  have trouble walking.  You are dizzy.  You lose your balance or your movements are clumsy (uncoordinated).  You suddenly have a very bad headache and you do not know the cause.  You have new chest pain.  Your heart feels like it is fluttering or skipping a beat (irregular heartbeat). Do not wait to see if the symptoms above go away. Get help right away. Call your local emergency services (911 in [place] Do not drive yourself to the hospital. This information is not intended to replace advice given to you by your health care provider. Make sure you discuss any questions you have with your health care provider. Document Released: 08/11/2011 Document Revised: 07/18/2015 Document Reviewed: 08/12/2012 Elsevier Interactive Patient Education  [employer].

## 2017-07-14 NOTE — Plan of Care (Signed)
  Problem: Acute Rehab OT Goals (only OT should resolve) Goal: Pt. Will Perform Eating Flowsheets (Taken 07/14/2017 0831) Pt Will Perform Eating: with set-up;sitting Goal: Pt. Will Perform Grooming Flowsheets (Taken 07/14/2017 0831) Pt Will Perform Grooming: with set-up;sitting Goal: Pt. Will Transfer To Toilet Flowsheets (Taken 07/14/2017 0831) Pt Will Transfer to Toilet: with mod assist;stand pivot transfer;regular height toilet;bedside commode;ambulating Goal: Pt. Will Perform Toileting-Clothing Manipulation Flowsheets (Taken 07/14/2017 0831) Pt Will Perform Toileting - Clothing Manipulation and hygiene: with min assist;sitting/lateral leans;sit to/from stand;with caregiver independent in assisting Goal: Pt/Caregiver Will Perform Home Exercise Program Flowsheets (Taken 07/14/2017 0831) Pt/caregiver will Perform Home Exercise Program: Increased strength;Left upper extremity;With minimal assist;With Supervision;With written HEP provided

## 2017-07-14 NOTE — Care Management Note (Signed)
Case Management Note  Patient Details  Name: Courtney [name] MRN: [id number] Date of Birth: [birth year]  Subjective/Objective:    Admitted with weakness, from home with son who provides max assist. Pt ESRD on HD.                 Action/Plan: DC home today. OT recommends HH, PT recommends no HH f/u. Per PT pt's son feels he is capable of caring for her. They are not interested in any [facility] services. No DME needs at DC.  No CM needs noted at this time.   Expected Discharge Date:  07/14/17               Expected Discharge Plan:  Home/Self Care  In-House Referral:  NA  Discharge planning Services  CM Consult  Post Acute Care Choice:  NA Choice offered to:  NA  Status of Service:  Completed, signed off  Sherald Barge, RN 07/14/2017, 3:19 PM

## 2017-07-15 DIAGNOSIS — N2581 Secondary hyperparathyroidism of renal origin: Secondary | ICD-10-CM | POA: Diagnosis not present

## 2017-07-15 DIAGNOSIS — Z992 Dependence on renal dialysis: Secondary | ICD-10-CM | POA: Diagnosis not present

## 2017-07-15 DIAGNOSIS — N186 End stage renal disease: Secondary | ICD-10-CM | POA: Diagnosis not present

## 2017-07-15 DIAGNOSIS — D631 Anemia in chronic kidney disease: Secondary | ICD-10-CM | POA: Diagnosis not present

## 2017-07-15 DIAGNOSIS — D509 Iron deficiency anemia, unspecified: Secondary | ICD-10-CM | POA: Diagnosis not present

## 2017-07-15 LAB — MRSA CULTURE: CULTURE: NOT DETECTED

## 2017-07-15 LAB — HEPATITIS B SURFACE ANTIGEN: Hepatitis B Surface Ag: NEGATIVE

## 2017-07-16 DIAGNOSIS — I110 Hypertensive heart disease with heart failure: Secondary | ICD-10-CM | POA: Diagnosis not present

## 2017-07-16 DIAGNOSIS — E7849 Other hyperlipidemia: Secondary | ICD-10-CM | POA: Diagnosis not present

## 2017-07-16 DIAGNOSIS — I5032 Chronic diastolic (congestive) heart failure: Secondary | ICD-10-CM | POA: Diagnosis not present

## 2017-07-16 DIAGNOSIS — L89152 Pressure ulcer of sacral region, stage 2: Secondary | ICD-10-CM | POA: Diagnosis not present

## 2017-07-16 DIAGNOSIS — F339 Major depressive disorder, recurrent, unspecified: Secondary | ICD-10-CM | POA: Diagnosis not present

## 2017-07-17 DIAGNOSIS — N186 End stage renal disease: Secondary | ICD-10-CM | POA: Diagnosis not present

## 2017-07-17 DIAGNOSIS — N2581 Secondary hyperparathyroidism of renal origin: Secondary | ICD-10-CM | POA: Diagnosis not present

## 2017-07-17 DIAGNOSIS — D509 Iron deficiency anemia, unspecified: Secondary | ICD-10-CM | POA: Diagnosis not present

## 2017-07-17 DIAGNOSIS — Z992 Dependence on renal dialysis: Secondary | ICD-10-CM | POA: Diagnosis not present

## 2017-07-17 DIAGNOSIS — D631 Anemia in chronic kidney disease: Secondary | ICD-10-CM | POA: Diagnosis not present

## 2017-07-20 DIAGNOSIS — Z992 Dependence on renal dialysis: Secondary | ICD-10-CM | POA: Diagnosis not present

## 2017-07-20 DIAGNOSIS — D631 Anemia in chronic kidney disease: Secondary | ICD-10-CM | POA: Diagnosis not present

## 2017-07-20 DIAGNOSIS — N2581 Secondary hyperparathyroidism of renal origin: Secondary | ICD-10-CM | POA: Diagnosis not present

## 2017-07-20 DIAGNOSIS — D509 Iron deficiency anemia, unspecified: Secondary | ICD-10-CM | POA: Diagnosis not present

## 2017-07-20 DIAGNOSIS — N186 End stage renal disease: Secondary | ICD-10-CM | POA: Diagnosis not present

## 2017-07-21 DIAGNOSIS — F339 Major depressive disorder, recurrent, unspecified: Secondary | ICD-10-CM | POA: Diagnosis not present

## 2017-07-21 DIAGNOSIS — L89152 Pressure ulcer of sacral region, stage 2: Secondary | ICD-10-CM | POA: Diagnosis not present

## 2017-07-21 DIAGNOSIS — E7849 Other hyperlipidemia: Secondary | ICD-10-CM | POA: Diagnosis not present

## 2017-07-21 DIAGNOSIS — I5032 Chronic diastolic (congestive) heart failure: Secondary | ICD-10-CM | POA: Diagnosis not present

## 2017-07-21 DIAGNOSIS — I110 Hypertensive heart disease with heart failure: Secondary | ICD-10-CM | POA: Diagnosis not present

## 2017-07-22 DIAGNOSIS — D631 Anemia in chronic kidney disease: Secondary | ICD-10-CM | POA: Diagnosis not present

## 2017-07-22 DIAGNOSIS — Z992 Dependence on renal dialysis: Secondary | ICD-10-CM | POA: Diagnosis not present

## 2017-07-22 DIAGNOSIS — N186 End stage renal disease: Secondary | ICD-10-CM | POA: Diagnosis not present

## 2017-07-22 DIAGNOSIS — N2581 Secondary hyperparathyroidism of renal origin: Secondary | ICD-10-CM | POA: Diagnosis not present

## 2017-07-22 DIAGNOSIS — D509 Iron deficiency anemia, unspecified: Secondary | ICD-10-CM | POA: Diagnosis not present

## 2017-07-23 DIAGNOSIS — Z992 Dependence on renal dialysis: Secondary | ICD-10-CM | POA: Diagnosis not present

## 2017-07-23 DIAGNOSIS — N186 End stage renal disease: Secondary | ICD-10-CM | POA: Diagnosis not present

## 2017-07-24 DIAGNOSIS — Z992 Dependence on renal dialysis: Secondary | ICD-10-CM | POA: Diagnosis not present

## 2017-07-24 DIAGNOSIS — N2581 Secondary hyperparathyroidism of renal origin: Secondary | ICD-10-CM | POA: Diagnosis not present

## 2017-07-24 DIAGNOSIS — D509 Iron deficiency anemia, unspecified: Secondary | ICD-10-CM | POA: Diagnosis not present

## 2017-07-24 DIAGNOSIS — N186 End stage renal disease: Secondary | ICD-10-CM | POA: Diagnosis not present

## 2017-07-24 DIAGNOSIS — D631 Anemia in chronic kidney disease: Secondary | ICD-10-CM | POA: Diagnosis not present

## 2017-07-27 DIAGNOSIS — D509 Iron deficiency anemia, unspecified: Secondary | ICD-10-CM | POA: Diagnosis not present

## 2017-07-27 DIAGNOSIS — D631 Anemia in chronic kidney disease: Secondary | ICD-10-CM | POA: Diagnosis not present

## 2017-07-27 DIAGNOSIS — N2581 Secondary hyperparathyroidism of renal origin: Secondary | ICD-10-CM | POA: Diagnosis not present

## 2017-07-27 DIAGNOSIS — N186 End stage renal disease: Secondary | ICD-10-CM | POA: Diagnosis not present

## 2017-07-27 DIAGNOSIS — Z992 Dependence on renal dialysis: Secondary | ICD-10-CM | POA: Diagnosis not present

## 2017-07-28 DIAGNOSIS — I5032 Chronic diastolic (congestive) heart failure: Secondary | ICD-10-CM | POA: Diagnosis not present

## 2017-07-28 DIAGNOSIS — F339 Major depressive disorder, recurrent, unspecified: Secondary | ICD-10-CM | POA: Diagnosis not present

## 2017-07-28 DIAGNOSIS — L89152 Pressure ulcer of sacral region, stage 2: Secondary | ICD-10-CM | POA: Diagnosis not present

## 2017-07-28 DIAGNOSIS — I110 Hypertensive heart disease with heart failure: Secondary | ICD-10-CM | POA: Diagnosis not present

## 2017-07-28 DIAGNOSIS — E7849 Other hyperlipidemia: Secondary | ICD-10-CM | POA: Diagnosis not present

## 2017-07-29 DIAGNOSIS — N186 End stage renal disease: Secondary | ICD-10-CM | POA: Diagnosis not present

## 2017-07-29 DIAGNOSIS — N2581 Secondary hyperparathyroidism of renal origin: Secondary | ICD-10-CM | POA: Diagnosis not present

## 2017-07-29 DIAGNOSIS — D631 Anemia in chronic kidney disease: Secondary | ICD-10-CM | POA: Diagnosis not present

## 2017-07-29 DIAGNOSIS — D509 Iron deficiency anemia, unspecified: Secondary | ICD-10-CM | POA: Diagnosis not present

## 2017-07-29 DIAGNOSIS — Z992 Dependence on renal dialysis: Secondary | ICD-10-CM | POA: Diagnosis not present

## 2017-07-31 DIAGNOSIS — N2581 Secondary hyperparathyroidism of renal origin: Secondary | ICD-10-CM | POA: Diagnosis not present

## 2017-07-31 DIAGNOSIS — Z992 Dependence on renal dialysis: Secondary | ICD-10-CM | POA: Diagnosis not present

## 2017-07-31 DIAGNOSIS — D509 Iron deficiency anemia, unspecified: Secondary | ICD-10-CM | POA: Diagnosis not present

## 2017-07-31 DIAGNOSIS — D631 Anemia in chronic kidney disease: Secondary | ICD-10-CM | POA: Diagnosis not present

## 2017-07-31 DIAGNOSIS — N186 End stage renal disease: Secondary | ICD-10-CM | POA: Diagnosis not present

## 2017-08-03 DIAGNOSIS — N186 End stage renal disease: Secondary | ICD-10-CM | POA: Diagnosis not present

## 2017-08-03 DIAGNOSIS — D509 Iron deficiency anemia, unspecified: Secondary | ICD-10-CM | POA: Diagnosis not present

## 2017-08-03 DIAGNOSIS — N2581 Secondary hyperparathyroidism of renal origin: Secondary | ICD-10-CM | POA: Diagnosis not present

## 2017-08-03 DIAGNOSIS — Z992 Dependence on renal dialysis: Secondary | ICD-10-CM | POA: Diagnosis not present

## 2017-08-03 DIAGNOSIS — D631 Anemia in chronic kidney disease: Secondary | ICD-10-CM | POA: Diagnosis not present

## 2017-08-04 DIAGNOSIS — I5032 Chronic diastolic (congestive) heart failure: Secondary | ICD-10-CM | POA: Diagnosis not present

## 2017-08-04 DIAGNOSIS — L89152 Pressure ulcer of sacral region, stage 2: Secondary | ICD-10-CM | POA: Diagnosis not present

## 2017-08-04 DIAGNOSIS — E7849 Other hyperlipidemia: Secondary | ICD-10-CM | POA: Diagnosis not present

## 2017-08-04 DIAGNOSIS — I110 Hypertensive heart disease with heart failure: Secondary | ICD-10-CM | POA: Diagnosis not present

## 2017-08-04 DIAGNOSIS — F339 Major depressive disorder, recurrent, unspecified: Secondary | ICD-10-CM | POA: Diagnosis not present

## 2017-08-05 DIAGNOSIS — Z992 Dependence on renal dialysis: Secondary | ICD-10-CM | POA: Diagnosis not present

## 2017-08-05 DIAGNOSIS — D509 Iron deficiency anemia, unspecified: Secondary | ICD-10-CM | POA: Diagnosis not present

## 2017-08-05 DIAGNOSIS — N2581 Secondary hyperparathyroidism of renal origin: Secondary | ICD-10-CM | POA: Diagnosis not present

## 2017-08-05 DIAGNOSIS — N186 End stage renal disease: Secondary | ICD-10-CM | POA: Diagnosis not present

## 2017-08-05 DIAGNOSIS — D631 Anemia in chronic kidney disease: Secondary | ICD-10-CM | POA: Diagnosis not present

## 2017-08-07 DIAGNOSIS — N2581 Secondary hyperparathyroidism of renal origin: Secondary | ICD-10-CM | POA: Diagnosis not present

## 2017-08-07 DIAGNOSIS — D509 Iron deficiency anemia, unspecified: Secondary | ICD-10-CM | POA: Diagnosis not present

## 2017-08-07 DIAGNOSIS — Z992 Dependence on renal dialysis: Secondary | ICD-10-CM | POA: Diagnosis not present

## 2017-08-07 DIAGNOSIS — N186 End stage renal disease: Secondary | ICD-10-CM | POA: Diagnosis not present

## 2017-08-07 DIAGNOSIS — D631 Anemia in chronic kidney disease: Secondary | ICD-10-CM | POA: Diagnosis not present

## 2017-08-10 DIAGNOSIS — D509 Iron deficiency anemia, unspecified: Secondary | ICD-10-CM | POA: Diagnosis not present

## 2017-08-10 DIAGNOSIS — Z992 Dependence on renal dialysis: Secondary | ICD-10-CM | POA: Diagnosis not present

## 2017-08-10 DIAGNOSIS — N186 End stage renal disease: Secondary | ICD-10-CM | POA: Diagnosis not present

## 2017-08-10 DIAGNOSIS — N2581 Secondary hyperparathyroidism of renal origin: Secondary | ICD-10-CM | POA: Diagnosis not present

## 2017-08-10 DIAGNOSIS — D631 Anemia in chronic kidney disease: Secondary | ICD-10-CM | POA: Diagnosis not present

## 2017-08-11 DIAGNOSIS — F339 Major depressive disorder, recurrent, unspecified: Secondary | ICD-10-CM | POA: Diagnosis not present

## 2017-08-11 DIAGNOSIS — L89152 Pressure ulcer of sacral region, stage 2: Secondary | ICD-10-CM | POA: Diagnosis not present

## 2017-08-11 DIAGNOSIS — I110 Hypertensive heart disease with heart failure: Secondary | ICD-10-CM | POA: Diagnosis not present

## 2017-08-11 DIAGNOSIS — E7849 Other hyperlipidemia: Secondary | ICD-10-CM | POA: Diagnosis not present

## 2017-08-11 DIAGNOSIS — I5032 Chronic diastolic (congestive) heart failure: Secondary | ICD-10-CM | POA: Diagnosis not present

## 2017-08-12 DIAGNOSIS — D631 Anemia in chronic kidney disease: Secondary | ICD-10-CM | POA: Diagnosis not present

## 2017-08-12 DIAGNOSIS — D509 Iron deficiency anemia, unspecified: Secondary | ICD-10-CM | POA: Diagnosis not present

## 2017-08-12 DIAGNOSIS — N186 End stage renal disease: Secondary | ICD-10-CM | POA: Diagnosis not present

## 2017-08-12 DIAGNOSIS — N2581 Secondary hyperparathyroidism of renal origin: Secondary | ICD-10-CM | POA: Diagnosis not present

## 2017-08-12 DIAGNOSIS — Z992 Dependence on renal dialysis: Secondary | ICD-10-CM | POA: Diagnosis not present

## 2017-08-13 DIAGNOSIS — I5032 Chronic diastolic (congestive) heart failure: Secondary | ICD-10-CM | POA: Diagnosis not present

## 2017-08-13 DIAGNOSIS — F339 Major depressive disorder, recurrent, unspecified: Secondary | ICD-10-CM | POA: Diagnosis not present

## 2017-08-13 DIAGNOSIS — E7849 Other hyperlipidemia: Secondary | ICD-10-CM | POA: Diagnosis not present

## 2017-08-13 DIAGNOSIS — L89152 Pressure ulcer of sacral region, stage 2: Secondary | ICD-10-CM | POA: Diagnosis not present

## 2017-08-13 DIAGNOSIS — I110 Hypertensive heart disease with heart failure: Secondary | ICD-10-CM | POA: Diagnosis not present

## 2017-08-14 DIAGNOSIS — D631 Anemia in chronic kidney disease: Secondary | ICD-10-CM | POA: Diagnosis not present

## 2017-08-14 DIAGNOSIS — N186 End stage renal disease: Secondary | ICD-10-CM | POA: Diagnosis not present

## 2017-08-14 DIAGNOSIS — D509 Iron deficiency anemia, unspecified: Secondary | ICD-10-CM | POA: Diagnosis not present

## 2017-08-14 DIAGNOSIS — N2581 Secondary hyperparathyroidism of renal origin: Secondary | ICD-10-CM | POA: Diagnosis not present

## 2017-08-14 DIAGNOSIS — Z992 Dependence on renal dialysis: Secondary | ICD-10-CM | POA: Diagnosis not present

## 2017-08-17 DIAGNOSIS — Z992 Dependence on renal dialysis: Secondary | ICD-10-CM | POA: Diagnosis not present

## 2017-08-17 DIAGNOSIS — N186 End stage renal disease: Secondary | ICD-10-CM | POA: Diagnosis not present

## 2017-08-17 DIAGNOSIS — E119 Type 2 diabetes mellitus without complications: Secondary | ICD-10-CM | POA: Diagnosis not present

## 2017-08-17 DIAGNOSIS — D631 Anemia in chronic kidney disease: Secondary | ICD-10-CM | POA: Diagnosis not present

## 2017-08-17 DIAGNOSIS — N2581 Secondary hyperparathyroidism of renal origin: Secondary | ICD-10-CM | POA: Diagnosis not present

## 2017-08-17 DIAGNOSIS — D509 Iron deficiency anemia, unspecified: Secondary | ICD-10-CM | POA: Diagnosis not present

## 2017-08-18 DIAGNOSIS — L89152 Pressure ulcer of sacral region, stage 2: Secondary | ICD-10-CM | POA: Diagnosis not present

## 2017-08-18 DIAGNOSIS — F339 Major depressive disorder, recurrent, unspecified: Secondary | ICD-10-CM | POA: Diagnosis not present

## 2017-08-18 DIAGNOSIS — I110 Hypertensive heart disease with heart failure: Secondary | ICD-10-CM | POA: Diagnosis not present

## 2017-08-18 DIAGNOSIS — E7849 Other hyperlipidemia: Secondary | ICD-10-CM | POA: Diagnosis not present

## 2017-08-18 DIAGNOSIS — I5032 Chronic diastolic (congestive) heart failure: Secondary | ICD-10-CM | POA: Diagnosis not present

## 2017-08-19 DIAGNOSIS — N2581 Secondary hyperparathyroidism of renal origin: Secondary | ICD-10-CM | POA: Diagnosis not present

## 2017-08-19 DIAGNOSIS — N186 End stage renal disease: Secondary | ICD-10-CM | POA: Diagnosis not present

## 2017-08-19 DIAGNOSIS — D509 Iron deficiency anemia, unspecified: Secondary | ICD-10-CM | POA: Diagnosis not present

## 2017-08-19 DIAGNOSIS — D631 Anemia in chronic kidney disease: Secondary | ICD-10-CM | POA: Diagnosis not present

## 2017-08-19 DIAGNOSIS — Z992 Dependence on renal dialysis: Secondary | ICD-10-CM | POA: Diagnosis not present

## 2017-08-21 DIAGNOSIS — N2581 Secondary hyperparathyroidism of renal origin: Secondary | ICD-10-CM | POA: Diagnosis not present

## 2017-08-21 DIAGNOSIS — D509 Iron deficiency anemia, unspecified: Secondary | ICD-10-CM | POA: Diagnosis not present

## 2017-08-21 DIAGNOSIS — D631 Anemia in chronic kidney disease: Secondary | ICD-10-CM | POA: Diagnosis not present

## 2017-08-21 DIAGNOSIS — N186 End stage renal disease: Secondary | ICD-10-CM | POA: Diagnosis not present

## 2017-08-21 DIAGNOSIS — Z992 Dependence on renal dialysis: Secondary | ICD-10-CM | POA: Diagnosis not present

## 2017-08-22 DIAGNOSIS — N186 End stage renal disease: Secondary | ICD-10-CM | POA: Diagnosis not present

## 2017-08-22 DIAGNOSIS — Z992 Dependence on renal dialysis: Secondary | ICD-10-CM | POA: Diagnosis not present

## 2017-08-24 DIAGNOSIS — D631 Anemia in chronic kidney disease: Secondary | ICD-10-CM | POA: Diagnosis not present

## 2017-08-24 DIAGNOSIS — Z992 Dependence on renal dialysis: Secondary | ICD-10-CM | POA: Diagnosis not present

## 2017-08-24 DIAGNOSIS — N2581 Secondary hyperparathyroidism of renal origin: Secondary | ICD-10-CM | POA: Diagnosis not present

## 2017-08-24 DIAGNOSIS — D509 Iron deficiency anemia, unspecified: Secondary | ICD-10-CM | POA: Diagnosis not present

## 2017-08-24 DIAGNOSIS — N186 End stage renal disease: Secondary | ICD-10-CM | POA: Diagnosis not present

## 2017-08-24 DIAGNOSIS — I259 Chronic ischemic heart disease, unspecified: Secondary | ICD-10-CM | POA: Diagnosis not present

## 2017-08-25 DIAGNOSIS — F339 Major depressive disorder, recurrent, unspecified: Secondary | ICD-10-CM | POA: Diagnosis not present

## 2017-08-25 DIAGNOSIS — L89152 Pressure ulcer of sacral region, stage 2: Secondary | ICD-10-CM | POA: Diagnosis not present

## 2017-08-25 DIAGNOSIS — I110 Hypertensive heart disease with heart failure: Secondary | ICD-10-CM | POA: Diagnosis not present

## 2017-08-25 DIAGNOSIS — E7849 Other hyperlipidemia: Secondary | ICD-10-CM | POA: Diagnosis not present

## 2017-08-25 DIAGNOSIS — I5032 Chronic diastolic (congestive) heart failure: Secondary | ICD-10-CM | POA: Diagnosis not present

## 2017-08-26 DIAGNOSIS — D631 Anemia in chronic kidney disease: Secondary | ICD-10-CM | POA: Diagnosis not present

## 2017-08-26 DIAGNOSIS — N2581 Secondary hyperparathyroidism of renal origin: Secondary | ICD-10-CM | POA: Diagnosis not present

## 2017-08-26 DIAGNOSIS — N186 End stage renal disease: Secondary | ICD-10-CM | POA: Diagnosis not present

## 2017-08-26 DIAGNOSIS — Z992 Dependence on renal dialysis: Secondary | ICD-10-CM | POA: Diagnosis not present

## 2017-08-26 DIAGNOSIS — D509 Iron deficiency anemia, unspecified: Secondary | ICD-10-CM | POA: Diagnosis not present

## 2017-08-28 DIAGNOSIS — N2581 Secondary hyperparathyroidism of renal origin: Secondary | ICD-10-CM | POA: Diagnosis not present

## 2017-08-28 DIAGNOSIS — Z992 Dependence on renal dialysis: Secondary | ICD-10-CM | POA: Diagnosis not present

## 2017-08-28 DIAGNOSIS — D631 Anemia in chronic kidney disease: Secondary | ICD-10-CM | POA: Diagnosis not present

## 2017-08-28 DIAGNOSIS — N186 End stage renal disease: Secondary | ICD-10-CM | POA: Diagnosis not present

## 2017-08-28 DIAGNOSIS — D509 Iron deficiency anemia, unspecified: Secondary | ICD-10-CM | POA: Diagnosis not present

## 2017-08-31 DIAGNOSIS — D509 Iron deficiency anemia, unspecified: Secondary | ICD-10-CM | POA: Diagnosis not present

## 2017-08-31 DIAGNOSIS — Z992 Dependence on renal dialysis: Secondary | ICD-10-CM | POA: Diagnosis not present

## 2017-08-31 DIAGNOSIS — N2581 Secondary hyperparathyroidism of renal origin: Secondary | ICD-10-CM | POA: Diagnosis not present

## 2017-08-31 DIAGNOSIS — N186 End stage renal disease: Secondary | ICD-10-CM | POA: Diagnosis not present

## 2017-08-31 DIAGNOSIS — D631 Anemia in chronic kidney disease: Secondary | ICD-10-CM | POA: Diagnosis not present

## 2017-09-02 DIAGNOSIS — N186 End stage renal disease: Secondary | ICD-10-CM | POA: Diagnosis not present

## 2017-09-02 DIAGNOSIS — N2581 Secondary hyperparathyroidism of renal origin: Secondary | ICD-10-CM | POA: Diagnosis not present

## 2017-09-02 DIAGNOSIS — D509 Iron deficiency anemia, unspecified: Secondary | ICD-10-CM | POA: Diagnosis not present

## 2017-09-02 DIAGNOSIS — D631 Anemia in chronic kidney disease: Secondary | ICD-10-CM | POA: Diagnosis not present

## 2017-09-02 DIAGNOSIS — Z992 Dependence on renal dialysis: Secondary | ICD-10-CM | POA: Diagnosis not present

## 2017-09-06 ENCOUNTER — Other Ambulatory Visit: Payer: Self-pay

## 2017-09-06 ENCOUNTER — Other Ambulatory Visit (HOSPITAL_COMMUNITY): Payer: Self-pay | Admitting: Nephrology

## 2017-09-06 ENCOUNTER — Ambulatory Visit (HOSPITAL_COMMUNITY)
Admission: RE | Admit: 2017-09-06 | Discharge: 2017-09-06 | Disposition: A | Discharge status: Home / Self Care | Payer: Medicare Other | Source: Ambulatory Visit | Attending: Nephrology | Admitting: Nephrology

## 2017-09-06 ENCOUNTER — Other Ambulatory Visit: Payer: Self-pay | Admitting: Radiology

## 2017-09-06 ENCOUNTER — Encounter (HOSPITAL_COMMUNITY): Payer: Self-pay

## 2017-09-06 DIAGNOSIS — E785 Hyperlipidemia, unspecified: Secondary | ICD-10-CM | POA: Diagnosis not present

## 2017-09-06 DIAGNOSIS — T82868A Thrombosis of vascular prosthetic devices, implants and grafts, initial encounter: Secondary | ICD-10-CM | POA: Insufficient documentation

## 2017-09-06 DIAGNOSIS — T82858A Stenosis of vascular prosthetic devices, implants and grafts, initial encounter: Secondary | ICD-10-CM | POA: Diagnosis not present

## 2017-09-06 DIAGNOSIS — N186 End stage renal disease: Secondary | ICD-10-CM | POA: Diagnosis not present

## 2017-09-06 DIAGNOSIS — I69351 Hemiplegia and hemiparesis following cerebral infarction affecting right dominant side: Secondary | ICD-10-CM | POA: Insufficient documentation

## 2017-09-06 DIAGNOSIS — I132 Hypertensive heart and chronic kidney disease with heart failure and with stage 5 chronic kidney disease, or end stage renal disease: Secondary | ICD-10-CM | POA: Diagnosis not present

## 2017-09-06 DIAGNOSIS — Y832 Surgical operation with anastomosis, bypass or graft as the cause of abnormal reaction of the patient, or of later complication, without mention of misadventure at the time of the procedure: Secondary | ICD-10-CM | POA: Insufficient documentation

## 2017-09-06 DIAGNOSIS — Z7982 Long term (current) use of aspirin: Secondary | ICD-10-CM | POA: Insufficient documentation

## 2017-09-06 DIAGNOSIS — I509 Heart failure, unspecified: Secondary | ICD-10-CM | POA: Diagnosis not present

## 2017-09-06 DIAGNOSIS — E1122 Type 2 diabetes mellitus with diabetic chronic kidney disease: Secondary | ICD-10-CM | POA: Diagnosis not present

## 2017-09-06 DIAGNOSIS — Z7902 Long term (current) use of antithrombotics/antiplatelets: Secondary | ICD-10-CM | POA: Diagnosis not present

## 2017-09-06 DIAGNOSIS — T8249XD Other complication of vascular dialysis catheter, subsequent encounter: Secondary | ICD-10-CM | POA: Diagnosis not present

## 2017-09-06 DIAGNOSIS — T8249XA Other complication of vascular dialysis catheter, initial encounter: Secondary | ICD-10-CM | POA: Diagnosis not present

## 2017-09-06 DIAGNOSIS — I2510 Atherosclerotic heart disease of native coronary artery without angina pectoris: Secondary | ICD-10-CM | POA: Insufficient documentation

## 2017-09-06 DIAGNOSIS — I252 Old myocardial infarction: Secondary | ICD-10-CM | POA: Insufficient documentation

## 2017-09-06 HISTORY — PX: IR US GUIDE VASC ACCESS RIGHT: IMG2390

## 2017-09-06 HISTORY — PX: IR THROMBECTOMY AV FISTULA W/THROMBOLYSIS/PTA INC/SHUNT/IMG RIGHT: IMG6119

## 2017-09-06 LAB — PROTIME-INR
INR: 1.09
PROTHROMBIN TIME: 14.1 s (ref 11.4–15.2)

## 2017-09-06 LAB — BASIC METABOLIC PANEL
Anion gap: 11 (ref 5–15)
BUN: 45 mg/dL — AB (ref 8–23)
CALCIUM: 8.1 mg/dL — AB (ref 8.9–10.3)
CO2: 25 mmol/L (ref 22–32)
Chloride: 108 mmol/L (ref 98–111)
Creatinine, Ser: 7.19 mg/dL — ABNORMAL HIGH (ref 0.44–1.00)
GFR calc Af Amer: 6 mL/min — ABNORMAL LOW (ref >60)
GFR calc non Af Amer: 5 mL/min — ABNORMAL LOW (ref >60)
GLUCOSE: 72 mg/dL (ref 70–99)
Potassium: 4.3 mmol/L (ref 3.5–5.1)
Sodium: 144 mmol/L (ref 135–145)

## 2017-09-06 LAB — CBC
HCT: 34 % — ABNORMAL LOW (ref 36.0–46.0)
Hemoglobin: 10.6 g/dL — ABNORMAL LOW (ref 12.0–15.0)
MCH: 30.8 pg (ref 26.0–34.0)
MCHC: 31.2 g/dL (ref 30.0–36.0)
MCV: 98.8 fL (ref 78.0–100.0)
Platelets: 146 10*3/uL — ABNORMAL LOW (ref 150–400)
RBC: 3.44 MIL/uL — ABNORMAL LOW (ref 3.87–5.11)
RDW: 16 % — AB (ref 11.5–15.5)
WBC: 4.8 10*3/uL (ref 4.0–10.5)

## 2017-09-06 LAB — GLUCOSE, CAPILLARY: Glucose-Capillary: 70 mg/dL (ref 70–99)

## 2017-09-06 MED ORDER — IOPAMIDOL (ISOVUE-300) INJECTION 61%
INTRAVENOUS | Status: AC
  Administered 2017-09-06: 50 mL
  Filled 2017-09-06: qty 100

## 2017-09-06 MED ORDER — LIDOCAINE HCL 1 % IJ SOLN
INTRAMUSCULAR | Status: AC
  Filled 2017-09-06: qty 20

## 2017-09-06 MED ORDER — LIDOCAINE HCL 1 % IJ SOLN
INTRAMUSCULAR | Status: AC | PRN
  Administered 2017-09-06: 10 mL

## 2017-09-06 MED ORDER — HEPARIN SODIUM (PORCINE) 1000 UNIT/ML IJ SOLN
INTRAMUSCULAR | Status: AC | PRN
  Administered 2017-09-06: 3000 [IU] via INTRAVENOUS

## 2017-09-06 MED ORDER — SODIUM CHLORIDE 0.9 % IV SOLN: INTRAVENOUS | Status: DC

## 2017-09-06 MED ORDER — FENTANYL CITRATE (PF) 100 MCG/2ML IJ SOLN
INTRAMUSCULAR | Status: AC
  Filled 2017-09-06: qty 4

## 2017-09-06 MED ORDER — MIDAZOLAM HCL 2 MG/2ML IJ SOLN
INTRAMUSCULAR | Status: AC
  Filled 2017-09-06: qty 4

## 2017-09-06 MED ORDER — ALTEPLASE 2 MG IJ SOLR
INTRAMUSCULAR | Status: AC | PRN
  Administered 2017-09-06: 2 mg

## 2017-09-06 MED ORDER — ALTEPLASE 2 MG IJ SOLR
INTRAMUSCULAR | Status: AC
  Filled 2017-09-06: qty 2

## 2017-09-06 MED ORDER — HEPARIN SODIUM (PORCINE) 1000 UNIT/ML IJ SOLN
INTRAMUSCULAR | Status: AC
  Filled 2017-09-06: qty 1

## 2017-09-06 NOTE — Discharge Instructions (Addendum)
Fistulogram, Care After °Refer to this sheet in the next few weeks. These instructions provide you with information on caring for yourself after your procedure. Your health care provider may also give you more specific instructions. Your treatment has been planned according to current medical practices, but problems sometimes occur. Call your health care provider if you have any problems or questions after your procedure. °What can I expect after the procedure? °After your procedure, it is typical to have the following: °· A small amount of discomfort in the area where the catheters were placed. °· A small amount of bruising around the fistula. °· Sleepiness and fatigue. ° °Follow these instructions at home: °· Rest at home for the day following your procedure. °· Do not drive or operate heavy machinery while taking pain medicine. °· Take medicines only as directed by your health care provider. °· Do not take baths, swim, or use a hot tub until your health care provider approves. You may shower 24 hours after the procedure or as directed by your health care provider. °· There are many different ways to close and cover an incision, including stitches, skin glue, and adhesive strips. Follow your health care provider's instructions on: °? Incision care. °? Bandage (dressing) changes and removal. °? Incision closure removal. °· Monitor your dialysis fistula carefully. °Contact a health care provider if: °· You have drainage, redness, swelling, or pain at your catheter site. °· You have a fever. °· You have chills. °Get help right away if: °· You feel weak. °· You have trouble balancing. °· You have trouble moving your arms or legs. °· You have problems with your speech or vision. °· You can no longer feel a vibration or buzz when you put your fingers over your dialysis fistula. °· The limb that was used for the procedure: °? Swells. °? Is painful. °? Is cold. °? Is discolored, such as blue or pale white. °This  information is not intended to replace advice given to you by your health care provider. Make sure you discuss any questions you have with your health care provider. °Document Released: 06/26/2013 Document Revised: 07/18/2015 Document Reviewed: 03/31/2013 °Elsevier Interactive Patient Education © 2018 [employer] ° ° °Moderate Conscious Sedation, Adult, Care After °These instructions provide you with information about caring for yourself after your procedure. Your health care provider may also give you more specific instructions. Your treatment has been planned according to current medical practices, but problems sometimes occur. Call your health care provider if you have any problems or questions after your procedure. °What can I expect after the procedure? °After your procedure, it is common: °· To feel sleepy for several hours. °· To feel clumsy and have poor balance for several hours. °· To have poor judgment for several hours. °· To vomit if you eat too soon. ° °Follow these instructions at home: °For at least 24 hours after the procedure: ° °· Do not: °? Participate in activities where you could fall or become injured. °? Drive. °? Use heavy machinery. °? Drink alcohol. °? Take sleeping pills or medicines that cause drowsiness. °? Make important decisions or sign legal documents. °? Take care of children on your own. °· Rest. °Eating and drinking °· Follow the diet recommended by your health care provider. °· If you vomit: °? Drink water, juice, or soup when you can drink without vomiting. °? Make sure you have little or no nausea before eating solid foods. °General instructions °· Have a responsible adult stay   with you until you are awake and alert. °· Take over-the-counter and prescription medicines only as told by your health care provider. °· If you smoke, do not smoke without supervision. °· Keep all follow-up visits as told by your health care provider. This is important. °Contact a health care  provider if: °· You keep feeling nauseous or you keep vomiting. °· You feel light-headed. °· You develop a rash. °· You have a fever. °Get help right away if: °· You have trouble breathing. °This information is not intended to replace advice given to you by your health care provider. Make sure you discuss any questions you have with your health care provider. °Document Released: 11/30/2012 Document Revised: 07/15/2015 Document Reviewed: 06/01/2015 °Elsevier Interactive Patient Education © 2018 [employer] ° °

## 2017-09-06 NOTE — H&P (Signed)
Chief Complaint: Patient was seen in consultation today for right upper arm dialysis graft thrombolysis at the request of [place]  Referring Physician(s): Fran Lowes  Supervising Physician: Markus Daft  Patient Status: [facility] - Out-pt  History of Present Illness: Courtney [name] is a 77 y.o. female   ESRD RUA dialysis fistula Placed 2017 Last use Thurs last week-- no issue Saturday was clotted No previous intervention  Scheduled for RUA dialysis fistula thrombolysis with possible angioplasty/stent placement Possible tunneled dialysis catheter placement  Past Medical History:  Diagnosis Date  . CHF (congestive heart failure) ([place])   . Coronary artery disease   . Diabetes mellitus without complication ([place])   . ESRD on dialysis ([place]) 02/10/2017  . Hyperlipidemia   . Hypertension   . Myocardial infarct ([place])   . Stroke [facility])    2-3 yrs ago- right sided weakness    Past Surgical History:  Procedure Laterality Date  . APPENDECTOMY    . AV FISTULA PLACEMENT Right 09/20/2015   Procedure: PLACEMENT OF RIGHT UPPER EXTREMITY ARTERIOVENOUS GORE-TEX GRAFT FOR HEMODIALYSIS ACCESS;  Surgeon: Vickie Epley, MD;  Location: [facility] ORS;  Service: Vascular;  Laterality: Right;  . CATARACT EXTRACTION W/PHACO Left 06/29/2013   Procedure: CATARACT EXTRACTION PHACO AND INTRAOCULAR LENS PLACEMENT (IOC);  Surgeon: Tonny Branch, MD;  Location: [facility] ORS;  Service: Ophthalmology;  Laterality: Left;  CDE 12.25  . CATARACT EXTRACTION W/PHACO Right 07/27/2013   Procedure: CATARACT EXTRACTION PHACO AND INTRAOCULAR LENS PLACEMENT (IOC);  Surgeon: Tonny Branch, MD;  Location: [facility];  Service: Ophthalmology;  Laterality: Right;  CDE:7.72  . INSERTION OF DIALYSIS CATHETER Right 09/11/2015   Procedure: INSERTION OF TUNNELED DIALYSIS CATHETER;  Surgeon: Vickie Epley, MD;  Location: [facility];  Service: Vascular;  Laterality: Right;  . INTRAMEDULLARY (IM) NAIL INTERTROCHANTERIC Right 07/20/2012   Procedure: INTRAMEDULLARY (IM) NAIL INTERTROCHANTRIC;  Surgeon: Carole Civil, MD;  Location: [facility];  Service: Orthopedics;  Laterality: Right;  . IR GENERIC HISTORICAL  09/25/2015   IR US GUIDE VASC ACCESS RIGHT 09/25/2015 Sandi Mariscal, MD [facility]  . IR GENERIC HISTORICAL  09/25/2015   IR FLUORO GUIDE CV LINE RIGHT 09/25/2015 Sandi Mariscal, MD [facility]  . IR GENERIC HISTORICAL  09/25/2015   IR REMOVAL TUN CV CATH W/O FL 09/25/2015 Sandi Mariscal, MD [facility]    Allergies: Grapefruit flavor [flavoring agent]  Medications: Prior to Admission medications   Medication Sig Start Date End Date Taking? Authorizing Provider  aspirin EC 81 MG tablet Take 81 mg by mouth daily.   Yes [provider]  atorvastatin (LIPITOR) 20 MG tablet Take 20 mg by mouth daily.   Yes [provider]  calcium-vitamin D (OSCAL WITH D) 500-200 MG-UNIT tablet Take 1 tablet by mouth.   Yes [provider]  cholecalciferol (VITAMIN D) 400 units TABS tablet Take 1,000 Units by mouth daily.    Yes [provider]  clopidogrel (PLAVIX) 75 MG tablet Take 1 tablet (75 mg total) by mouth daily. 07/14/17 07/14/18 Yes Purohit, Konrad Dolores, MD  escitalopram (LEXAPRO) 5 MG tablet Take 5 mg by mouth daily.   Yes [provider]  furosemide (LASIX) 20 MG tablet Take 20 mg by mouth daily.    Yes [provider]  hydrALAZINE (APRESOLINE) 25 MG tablet Take 25 mg by mouth 3 (three) times daily. 07/22/12  Yes Kathie Dike, MD  isosorbide mononitrate (IMDUR) 30 MG 24 hr tablet Take 30 mg by mouth daily.   Yes [provider]  K Phos Mono-Sod Phos Di & Mono (PHOSPHA 250 NEUTRAL) 155-852-130 MG TABS Take 1 tablet by mouth daily.    Yes [provider]  loperamide (IMODIUM A-D) 2 MG tablet Take 2 mg by mouth every 6 (six) hours as needed for diarrhea or loose stools.   Yes [provider]  omeprazole (PRILOSEC) 40 MG capsule Take 1 capsule (40 mg total) by mouth  daily. 02/10/17  Yes Setzer, Rona Ravens, NP  oxyCODONE-acetaminophen (ROXICET) 5-325 MG tablet Take 1 tablet by mouth every 6 (six) hours as needed for severe pain. 09/20/15   Vickie Epley, MD     Family History  Problem Relation Age of Onset  . Heart disease Mother   . Heart disease Father     Social History   Socioeconomic History  . Marital status: Single    Spouse name: Not on file  . Number of children: Not on file  . Years of education: Not on file  . Highest education level: Not on file  Occupational History  . Not on file  Social Needs  . Financial resource strain: Not on file  . Food insecurity:    Worry: Not on file    Inability: Not on file  . Transportation needs:    Medical: Not on file    Non-medical: Not on file  Tobacco Use  . Smoking status: Never Smoker  . Smokeless tobacco: Never Used  Substance and Sexual Activity  . Alcohol use: No  . Drug use: No  . Sexual activity: Never    Birth control/protection: None  Lifestyle  . Physical activity:    Days per week: Not on file    Minutes per session: Not on file  . Stress: Not on file  Relationships  . Social connections:    Talks on phone: Not on file    Gets together: Not on file    Attends religious service: Not on file    Active member of club or organization: Not on file    Attends meetings of clubs or organizations: Not on file    Relationship status: Not on file  Other Topics Concern  . Not on file  Social History Narrative  . Not on file    Review of Systems: A 12 point ROS discussed and pertinent positives are indicated in the HPI above.  All other systems are negative.  Review of Systems  Constitutional: Positive for fatigue. Negative for activity change and fever.  Respiratory: Negative for shortness of breath.   Cardiovascular: Negative for chest pain.  Gastrointestinal: Negative for abdominal pain.  Musculoskeletal: Positive for gait problem.  Neurological: Positive for  weakness.  Psychiatric/Behavioral: Negative for behavioral problems and confusion.    Vital Signs: BP (!) 145/78   Pulse 69   Temp 98 F (36.7 C) (Oral)   Ht 4\' 11"  (1.499 m)   SpO2 100%   BMI 15.05 kg/m   Physical Exam  Constitutional: She is oriented to person, place, and time.  Frail; ill apparimg  Cardiovascular: Normal rate and regular rhythm.  Pulmonary/Chest: Effort normal and breath sounds normal.  Abdominal: Soft. Bowel sounds are normal.  Musculoskeletal: Normal range of motion.  Right side weakness from CVA  Neurological: She is alert and oriented to person, place, and time.  Skin: Skin is warm and dry.  Psychiatric: She has a normal mood and affect. Her behavior is normal. Judgment and thought content normal.  Signed consent with son at bedside  Nursing note and vitals reviewed.   Imaging: No results found.  Labs:  CBC: Recent Labs    02/10/17 1139 07/13/17 1539 07/14/17 0432  WBC 4.5 5.8 6.1  HGB 11.4* 10.4* 10.2*  HCT 35.0 32.0* 32.4*  PLT 195 174 167    COAGS: Recent Labs    07/13/17 1835  INR 1.07  APTT 28    BMP: Recent Labs    02/10/17 1139 07/13/17 1539 07/14/17 0432  NA 139 134* 139  K 4.4 3.4* 3.5  CL 100 98* 99*  CO2 29 27 28   GLUCOSE 159* 85 80  BUN 23 16 19   CALCIUM 8.6 8.2* 8.4*  CREATININE 3.15* 3.56* 4.16*  GFRNONAA  --  12* 10*  GFRAA  --  13* 11*    LIVER FUNCTION TESTS: Recent Labs    02/10/17 1139 07/13/17 1539 07/14/17 0432  BILITOT 0.5 1.1  --   AST 15 21  --   ALT 6 15  --   ALKPHOS  --  38  --   PROT 5.5* 5.7*  --   ALBUMIN  --  2.6* 2.6*    TUMOR MARKERS: No results for input(s): AFPTM, CEA, CA199, CHROMGRNA in the last 8760 hours.  Assessment and Plan:  RUA dialysis graft clotted Last use Thur was without issue Saturday graft was clotted Now for declot with possible dialysis tunneled catheter placement Risks and benefits discussed with the patient including, but not limited to bleeding,  infection, vascular injury, pulmonary embolism, need for tunneled HD catheter placement or even death.  All of the patient's questions were answered, patient is agreeable to proceed. Consent signed and in chart.   Thank you for this interesting consult.  I greatly enjoyed meeting Courtney [name] and look forward to participating in their care.  A copy of this report was sent to the requesting provider on this date.  Electronically Signed: Lavonia Drafts, PA-C 09/06/2017, 12:42 PM   I spent a total of  30 Minutes   in face to face in clinical consultation, greater than 50% of which was counseling/coordinating care for RUA dialysis graft declot

## 2017-09-06 NOTE — Sedation Documentation (Signed)
No sedation given pt monitored during case only

## 2017-09-06 NOTE — Procedures (Signed)
  Pre-operative Diagnosis: ESRD with hemodialysis and occluded right arm AV graft.       Post-operative Diagnosis:  ESRD with hemodialysis and occluded right arm AV graft.   Indications:  ESRD with hemodialysis and occluded right arm AV graft.  Procedure: AV graft declot with balloon angioplasty  Findings: Successful revascularization of right upper arm AV graft.  High grade stenosis at venous anastomosis and outflow vein.  Stenosis treated with 6 mm balloon.    Preferential flow into graft from right brachial artery.  Complications: none     EBL: Minimal  Plan: Discharge to home.  Remove sutures tomorrow at dialysis. Monitor right hand for steal symptoms.

## 2017-09-06 NOTE — Progress Notes (Addendum)
Small amt blood oozing noted right upper arm site; dressing changed, no further oozing noted; Dr Anselm Pancoast notified of oozing and no new orders

## 2017-09-07 ENCOUNTER — Other Ambulatory Visit (HOSPITAL_COMMUNITY): Payer: Self-pay | Admitting: Diagnostic Radiology

## 2017-09-07 DIAGNOSIS — D509 Iron deficiency anemia, unspecified: Secondary | ICD-10-CM | POA: Diagnosis not present

## 2017-09-07 DIAGNOSIS — N186 End stage renal disease: Secondary | ICD-10-CM | POA: Diagnosis not present

## 2017-09-07 DIAGNOSIS — Z992 Dependence on renal dialysis: Secondary | ICD-10-CM | POA: Diagnosis not present

## 2017-09-07 DIAGNOSIS — D631 Anemia in chronic kidney disease: Secondary | ICD-10-CM | POA: Diagnosis not present

## 2017-09-07 DIAGNOSIS — N2581 Secondary hyperparathyroidism of renal origin: Secondary | ICD-10-CM | POA: Diagnosis not present

## 2017-09-08 ENCOUNTER — Other Ambulatory Visit: Payer: Self-pay

## 2017-09-08 ENCOUNTER — Emergency Department (HOSPITAL_COMMUNITY): Payer: Medicare Other

## 2017-09-08 ENCOUNTER — Ambulatory Visit (HOSPITAL_COMMUNITY)
Admission: RE | Admit: 2017-09-08 | Discharge: 2017-09-08 | Disposition: A | Discharge status: Home / Self Care | Payer: Medicare Other | Source: Ambulatory Visit | Attending: Diagnostic Radiology | Admitting: Diagnostic Radiology

## 2017-09-08 ENCOUNTER — Encounter (HOSPITAL_COMMUNITY): Payer: Self-pay

## 2017-09-08 ENCOUNTER — Emergency Department (HOSPITAL_COMMUNITY)
Admission: EM | Admit: 2017-09-08 | Discharge: 2017-09-08 | Disposition: A | Discharge status: Home / Self Care | Payer: Medicare Other | Attending: Emergency Medicine | Admitting: Emergency Medicine

## 2017-09-08 DIAGNOSIS — S098XXA Other specified injuries of head, initial encounter: Secondary | ICD-10-CM | POA: Diagnosis present

## 2017-09-08 DIAGNOSIS — Z992 Dependence on renal dialysis: Secondary | ICD-10-CM | POA: Diagnosis not present

## 2017-09-08 DIAGNOSIS — S60511A Abrasion of right hand, initial encounter: Secondary | ICD-10-CM

## 2017-09-08 DIAGNOSIS — I509 Heart failure, unspecified: Secondary | ICD-10-CM | POA: Diagnosis not present

## 2017-09-08 DIAGNOSIS — Z7902 Long term (current) use of antithrombotics/antiplatelets: Secondary | ICD-10-CM | POA: Diagnosis not present

## 2017-09-08 DIAGNOSIS — R531 Weakness: Secondary | ICD-10-CM | POA: Diagnosis not present

## 2017-09-08 DIAGNOSIS — Z23 Encounter for immunization: Secondary | ICD-10-CM | POA: Insufficient documentation

## 2017-09-08 DIAGNOSIS — Z4802 Encounter for removal of sutures: Secondary | ICD-10-CM | POA: Insufficient documentation

## 2017-09-08 DIAGNOSIS — S6991XA Unspecified injury of right wrist, hand and finger(s), initial encounter: Secondary | ICD-10-CM | POA: Diagnosis not present

## 2017-09-08 DIAGNOSIS — R51 Headache: Secondary | ICD-10-CM | POA: Diagnosis not present

## 2017-09-08 DIAGNOSIS — S299XXA Unspecified injury of thorax, initial encounter: Secondary | ICD-10-CM | POA: Diagnosis not present

## 2017-09-08 DIAGNOSIS — Y92238 Other place in hospital as the place of occurrence of the external cause: Secondary | ICD-10-CM | POA: Insufficient documentation

## 2017-09-08 DIAGNOSIS — Y9389 Activity, other specified: Secondary | ICD-10-CM | POA: Insufficient documentation

## 2017-09-08 DIAGNOSIS — W19XXXA Unspecified fall, initial encounter: Secondary | ICD-10-CM

## 2017-09-08 DIAGNOSIS — I252 Old myocardial infarction: Secondary | ICD-10-CM | POA: Diagnosis not present

## 2017-09-08 DIAGNOSIS — I2510 Atherosclerotic heart disease of native coronary artery without angina pectoris: Secondary | ICD-10-CM | POA: Insufficient documentation

## 2017-09-08 DIAGNOSIS — I4510 Unspecified right bundle-branch block: Secondary | ICD-10-CM | POA: Diagnosis not present

## 2017-09-08 DIAGNOSIS — S199XXA Unspecified injury of neck, initial encounter: Secondary | ICD-10-CM | POA: Diagnosis not present

## 2017-09-08 DIAGNOSIS — S0083XA Contusion of other part of head, initial encounter: Secondary | ICD-10-CM | POA: Diagnosis not present

## 2017-09-08 DIAGNOSIS — Z8673 Personal history of transient ischemic attack (TIA), and cerebral infarction without residual deficits: Secondary | ICD-10-CM | POA: Insufficient documentation

## 2017-09-08 DIAGNOSIS — S0990XA Unspecified injury of head, initial encounter: Secondary | ICD-10-CM

## 2017-09-08 DIAGNOSIS — Z7982 Long term (current) use of aspirin: Secondary | ICD-10-CM | POA: Diagnosis not present

## 2017-09-08 DIAGNOSIS — E1122 Type 2 diabetes mellitus with diabetic chronic kidney disease: Secondary | ICD-10-CM | POA: Insufficient documentation

## 2017-09-08 DIAGNOSIS — I132 Hypertensive heart and chronic kidney disease with heart failure and with stage 5 chronic kidney disease, or end stage renal disease: Secondary | ICD-10-CM | POA: Diagnosis not present

## 2017-09-08 DIAGNOSIS — N186 End stage renal disease: Secondary | ICD-10-CM | POA: Insufficient documentation

## 2017-09-08 DIAGNOSIS — Y999 Unspecified external cause status: Secondary | ICD-10-CM | POA: Insufficient documentation

## 2017-09-08 LAB — CBC WITH DIFFERENTIAL/PLATELET
ABS IMMATURE GRANULOCYTES: 0 10*3/uL (ref 0.0–0.1)
BASOS ABS: 0 10*3/uL (ref 0.0–0.1)
Basophils Relative: 1 %
EOS ABS: 0.1 10*3/uL (ref 0.0–0.7)
EOS PCT: 1 %
HEMATOCRIT: 34.7 % — AB (ref 36.0–46.0)
HEMOGLOBIN: 11 g/dL — AB (ref 12.0–15.0)
Immature Granulocytes: 0 %
Lymphocytes Relative: 18 %
Lymphs Abs: 0.8 10*3/uL (ref 0.7–4.0)
MCH: 30.8 pg (ref 26.0–34.0)
MCHC: 31.7 g/dL (ref 30.0–36.0)
MCV: 97.2 fL (ref 78.0–100.0)
MONOS PCT: 7 %
Monocytes Absolute: 0.3 10*3/uL (ref 0.1–1.0)
NEUTROS PCT: 73 %
Neutro Abs: 3 10*3/uL (ref 1.7–7.7)
Platelets: 117 10*3/uL — ABNORMAL LOW (ref 150–400)
RBC: 3.57 MIL/uL — ABNORMAL LOW (ref 3.87–5.11)
RDW: 15.6 % — ABNORMAL HIGH (ref 11.5–15.5)
WBC: 4.2 10*3/uL (ref 4.0–10.5)

## 2017-09-08 LAB — BASIC METABOLIC PANEL
Anion gap: 12 (ref 5–15)
BUN: 22 mg/dL (ref 8–23)
CHLORIDE: 104 mmol/L (ref 98–111)
CO2: 26 mmol/L (ref 22–32)
CREATININE: 4.28 mg/dL — AB (ref 0.44–1.00)
Calcium: 8 mg/dL — ABNORMAL LOW (ref 8.9–10.3)
GFR calc Af Amer: 11 mL/min — ABNORMAL LOW (ref >60)
GFR calc non Af Amer: 9 mL/min — ABNORMAL LOW (ref >60)
GLUCOSE: 80 mg/dL (ref 70–99)
POTASSIUM: 3.4 mmol/L — AB (ref 3.5–5.1)
Sodium: 142 mmol/L (ref 135–145)

## 2017-09-08 LAB — PROTIME-INR
INR: 1.16
Prothrombin Time: 14.7 seconds (ref 11.4–15.2)

## 2017-09-08 MED ORDER — TETANUS-DIPHTH-ACELL PERTUSSIS 5-2.5-18.5 LF-MCG/0.5 IM SUSP
0.5000 mL | Freq: Once | INTRAMUSCULAR | Status: AC
  Administered 2017-09-08: 0.5 mL via INTRAMUSCULAR
  Filled 2017-09-08: qty 0.5

## 2017-09-08 NOTE — ED Notes (Signed)
Head wound cleaned w/ Saf-Clens

## 2017-09-08 NOTE — ED Notes (Signed)
Patient transported to CT 

## 2017-09-08 NOTE — ED Notes (Addendum)
CT notified that pt needs to come STAT, CT tech already here. No change in neuro status.

## 2017-09-08 NOTE — Discharge Instructions (Addendum)
Fortunately, your CT scan and imaging was negative today.  Your labs were also reassuring.  You do have some very small amount of fluid on your lungs.  It is important to go to dialysis tomorrow as scheduled.  Otherwise, return to the ER if you develop worsening headache, numbness, weakness, or other new concerning symptoms.
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
## 2017-09-08 NOTE — ED Provider Notes (Signed)
[facility] EMERGENCY DEPARTMENT Provider Note   CSN: [id number] Arrival date & time: 09/08/17  1114     History   Chief Complaint Chief Complaint  Patient presents with  . Fall  . Head Injury    HPI Courtney [name] is a 77 y.o. female.  HPI 77 year old female with past medical history as below including coronary disease, diabetes, hypertension, history of stroke, CHF, here with fall.  The patient was being wheeled to interventional radiology today for removal of sutures status post recent angioplasty of her AV fistula.  She reportedly got her feet stuck and fell forward from her wheelchair.  She directly struck her head.  She initially hit her hand as well.  There is no loss of consciousness.  Patient was well prior to the fall, and son states that she is at her mental baseline.  Patient has since complained of mild headache, as well as right hand pain.  Unknown tetanus status.  Patient has had no further bleeding from her AV fistula and had been recovering well.  She went to dialysis yesterday and sat without difficulty.  No recent medication changes.  She had a small breakfast today, then took all of her medications prior to coming.  No other complaints.  Remainder of history limited due to history of stroke.  Past Medical History:  Diagnosis Date  . CHF (congestive heart failure) ([place])   . Coronary artery disease   . Diabetes mellitus without complication ([place])   . ESRD on dialysis ([place]) 02/10/2017  . Hyperlipidemia   . Hypertension   . Myocardial infarct ([place])   . Stroke [facility])    2-3 yrs ago- right sided weakness    Patient Active Problem List   Diagnosis Date Noted  . Acute CVA (cerebrovascular accident) ([place]) 07/13/2017  . Hypokalemia 07/13/2017  . Anemia in ESRD (end-stage renal disease) ([place]) 07/13/2017  . Hyperlipidemia 07/13/2017  . Coronary artery disease 07/13/2017  . Type 2 diabetes mellitus ([place]) 07/13/2017  . ESRD on dialysis ([place]) 02/10/2017    . Intertrochanteric fracture of right hip ([place]) 08/16/2012  . Syncope 07/17/2012  . Closed right hip fracture ([place]) 07/17/2012  . HTN (hypertension) 07/17/2012  . DM (diabetes mellitus) ([place]) 07/17/2012  . Cerebrovascular disease 07/17/2012    Past Surgical History:  Procedure Laterality Date  . APPENDECTOMY    . AV FISTULA PLACEMENT Right 09/20/2015   Procedure: PLACEMENT OF RIGHT UPPER EXTREMITY ARTERIOVENOUS GORE-TEX GRAFT FOR HEMODIALYSIS ACCESS;  Surgeon: Vickie Epley, MD;  Location: [facility];  Service: Vascular;  Laterality: Right;  . CATARACT EXTRACTION W/PHACO Left 06/29/2013   Procedure: CATARACT EXTRACTION PHACO AND INTRAOCULAR LENS PLACEMENT (IOC);  Surgeon: Tonny Branch, MD;  Location: [facility];  Service: Ophthalmology;  Laterality: Left;  CDE 12.25  . CATARACT EXTRACTION W/PHACO Right 07/27/2013   Procedure: CATARACT EXTRACTION PHACO AND INTRAOCULAR LENS PLACEMENT (IOC);  Surgeon: Tonny Branch, MD;  Location: [facility];  Service: Ophthalmology;  Laterality: Right;  CDE:7.72  . INSERTION OF DIALYSIS CATHETER Right 09/11/2015   Procedure: INSERTION OF TUNNELED DIALYSIS CATHETER;  Surgeon: Vickie Epley, MD;  Location: [facility];  Service: Vascular;  Laterality: Right;  . INTRAMEDULLARY (IM) NAIL INTERTROCHANTERIC Right 07/20/2012   Procedure: INTRAMEDULLARY (IM) NAIL INTERTROCHANTRIC;  Surgeon: Carole Civil, MD;  Location: [facility];  Service: Orthopedics;  Laterality: Right;  . IR GENERIC HISTORICAL  09/25/2015   IR US GUIDE VASC ACCESS RIGHT 09/25/2015 Sandi Mariscal, MD [facility]  . IR  GENERIC HISTORICAL  09/25/2015   IR FLUORO GUIDE CV LINE RIGHT 09/25/2015 Sandi Mariscal, MD [facility]  . IR GENERIC HISTORICAL  09/25/2015   IR REMOVAL TUN CV CATH W/O FL 09/25/2015 Sandi Mariscal, MD [facility]  . IR THROMBECTOMY AV FISTULA W/THROMBOLYSIS/PTA INC/SHUNT/IMG RIGHT Right 09/06/2017  . IR US GUIDE VASC ACCESS RIGHT  09/06/2017     OB History   None      Home Medications    Prior to Admission  medications   Medication Sig Start Date End Date Taking? Authorizing Provider  aspirin EC 81 MG tablet Take 81 mg by mouth daily.    [provider]  atorvastatin (LIPITOR) 20 MG tablet Take 20 mg by mouth daily.    [provider]  calcium-vitamin D (OSCAL WITH D) 500-200 MG-UNIT tablet Take 1 tablet by mouth.    [provider]  cholecalciferol (VITAMIN D) 400 units TABS tablet Take 1,000 Units by mouth daily.     [provider]  clopidogrel (PLAVIX) 75 MG tablet Take 1 tablet (75 mg total) by mouth daily. 07/14/17 07/14/18  Purohit, Konrad Dolores, MD  escitalopram (LEXAPRO) 5 MG tablet Take 5 mg by mouth daily.    [provider]  furosemide (LASIX) 20 MG tablet Take 20 mg by mouth daily.     [provider]  hydrALAZINE (APRESOLINE) 25 MG tablet Take 25 mg by mouth 3 (three) times daily. 07/22/12   Kathie Dike, MD  isosorbide mononitrate (IMDUR) 30 MG 24 hr tablet Take 30 mg by mouth daily.    [provider]  K Phos Mono-Sod Phos Di & Mono (PHOSPHA 250 NEUTRAL) [phone] MG TABS Take 1 tablet by mouth daily.     [provider]  loperamide (IMODIUM A-D) 2 MG tablet Take 2 mg by mouth every 6 (six) hours as needed for diarrhea or loose stools.    [provider]  omeprazole (PRILOSEC) 40 MG capsule Take 1 capsule (40 mg total) by mouth daily. 02/10/17   Setzer, Rona Ravens, NP  oxyCODONE-acetaminophen (ROXICET) 5-325 MG tablet Take 1 tablet by mouth every 6 (six) hours as needed for severe pain. 09/20/15   Vickie Epley, MD    Family History Family History  Problem Relation Age of Onset  . Heart disease Mother   . Heart disease Father     Social History Social History   Tobacco Use  . Smoking status: Never Smoker  . Smokeless tobacco: Never Used  Substance Use Topics  . Alcohol use: No  . Drug use: No     Allergies   Grapefruit flavor [flavoring agent]   Review of Systems Review of Systems    Constitutional: Negative for chills, fatigue and fever.  HENT: Negative for congestion and rhinorrhea.   Eyes: Negative for visual disturbance.  Respiratory: Negative for cough, shortness of breath and wheezing.   Cardiovascular: Negative for chest pain and leg swelling.  Gastrointestinal: Negative for abdominal pain, diarrhea, nausea and vomiting.  Genitourinary: Negative for dysuria and flank pain.  Musculoskeletal: Negative for neck pain and neck stiffness.  Skin: Positive for wound. Negative for rash.  Allergic/Immunologic: Negative for immunocompromised state.  Neurological: Positive for headaches. Negative for syncope.  All other systems reviewed and are negative.    Physical Exam Updated Vital Signs BP 139/64   Pulse 67   Temp 98 F (36.7 C) (Oral)   Resp 15   Ht 4\' 11"  (1.499 m)   Wt 33.6 kg (74  lb)   SpO2 98%   BMI 14.95 kg/m   Physical Exam  Constitutional: She appears well-developed and well-nourished. No distress.  HENT:  Head: Normocephalic and atraumatic.  Approximately 2 x 2 centimeter forehead contusion with superficial abrasion.  No active bleeding.  Eyes: Conjunctivae are normal.  Neck: Neck supple.  Cardiovascular: Normal rate, regular rhythm and normal heart sounds. Exam reveals no friction rub.  No murmur heard. Pulmonary/Chest: Effort normal and breath sounds normal. No respiratory distress. She has no wheezes. She has no rales.  Abdominal: She exhibits no distension.  Musculoskeletal: She exhibits no edema.  Neurological: She is alert. She exhibits normal muscle tone.  Response to questions appropriately, follows commands.  At mental baseline per son's report.  Skin: Skin is warm. Capillary refill takes less than 2 seconds.  Psychiatric: She has a normal mood and affect.  Nursing note and vitals reviewed.    ED Treatments / Results  Labs (all labs ordered are listed, but only abnormal results are displayed) Labs Reviewed  CBC WITH  DIFFERENTIAL/PLATELET - Abnormal; Notable for the following components:      Result Value   RBC 3.57 (*)    Hemoglobin 11.0 (*)    HCT 34.7 (*)    RDW 15.6 (*)    All other components within normal limits  BASIC METABOLIC PANEL - Abnormal; Notable for the following components:   Potassium 3.4 (*)    Creatinine, Ser 4.28 (*)    Calcium 8.0 (*)    GFR calc non Af Amer 9 (*)    GFR calc Af Amer 11 (*)    All other components within normal limits  PROTIME-INR    EKG None  Radiology Dg Chest 2 View  Result Date: 09/08/2017 CLINICAL DATA:  Fall, weakness EXAM: CHEST - 2 VIEW COMPARISON:  07/14/2017 FINDINGS: Cardiomegaly with mild interstitial edema. Small bilateral pleural effusions, left greater than right, decreased. Visualized osseous structures are within normal limits. IMPRESSION: Cardiomegaly with mild interstitial edema. Small bilateral pleural effusions, left greater than right, decreased. Electronically Signed   By: Julian Hy M.D.   On: 09/08/2017 14:06   Ct Head Wo Contrast  Result Date: 09/08/2017 CLINICAL DATA:  Pain following fall EXAM: CT HEAD WITHOUT CONTRAST TECHNIQUE: Contiguous axial images were obtained from the base of the skull through the vertex without intravenous contrast. COMPARISON:  Head CT May 31, 2017; brain MRI Jul 13, 2017 FINDINGS: Brain: There is moderate diffuse atrophy. There is no intracranial mass, hemorrhage, extra-axial fluid collection, or midline shift. There is a prior infarct in the posterior mid right cerebellum. There is extensive small vessel disease throughout the centra semiovale bilaterally. There is a prior lacunar infarct in the anterior limb left internal capsule. There is a prior infarct in the left thalamus. There is patchy small vessel disease in each external capsule. No acute appearing infarct is evident on this current study. Vascular: No hyperdense vessel. There is calcification in each distal vertebral artery and carotid  siphon regions. Skull: There is a sizable right frontal scalp hematoma. Bony calvarium appears intact. Sinuses/Orbits: There is mucosal thickening in several ethmoid air cells. Other visualized paranasal sinuses are clear. Visualized orbits appear symmetric bilaterally. Other: Mastoid air cells are clear. IMPRESSION: 1. Atrophy with prior infarcts and extensive periventricular small vessel disease. No acute infarct evident. No mass or hemorrhage. No extra-axial fluid. 2.  Right frontal scalp hematoma.  No fracture evident. 3.  Multiple foci of arterial vascular calcification. 4.  Mucosal  thickening in several ethmoid air cells. Electronically Signed   By: Lowella Grip III M.D.   On: 09/08/2017 12:15   Ct Cervical Spine Wo Contrast  Result Date: 09/08/2017 CLINICAL DATA:  Initial evaluation for acute trauma, fall. EXAM: CT CERVICAL SPINE WITHOUT CONTRAST TECHNIQUE: Multidetector CT imaging of the cervical spine was performed without intravenous contrast. Multiplanar CT image reconstructions were also generated. COMPARISON:  None available. FINDINGS: Alignment: Straightening with reversal of the normal cervical lordosis, apex at C5. The left convex scoliosis. 3 mm anterolisthesis of C3 on C4, chronic and facet mediated. Skull base and vertebrae: Skull base intact. Normal C1-2 articulations are preserved. Dens is intact. Congenital fusion of the C2 and C3 vertebral bodies noted. Vertebral body heights maintained. No acute fracture. No discrete osseous lesions. Soft tissues and spinal canal: Paraspinous soft tissues demonstrate no acute abnormality. No abnormal prevertebral edema. Vascular calcifications present about the carotid bifurcations. Disc levels: Prominent degenerative thickening at the tectorial membrane. Moderate cervical spondylolysis at C5-6 through C7-T1. Multilevel facet arthrosis throughout the upper cervical spine with associated ankylosis, right greater than left. Upper chest: Large layering  bilateral pleural effusions partially visualized, left greater than right. Other: None. IMPRESSION: 1. No acute traumatic injury within the cervical spine. 2. Congenital fusion of C2-3. 3. Levoscoliosis with reversal of the normal cervical lordosis and moderate multilevel cervical spondylolysis, greatest at C5-6 through C7-T1. 4. Large layering bilateral pleural effusions. Electronically Signed   By: Jeannine Boga M.D.   On: 09/08/2017 13:55   Ir US Guide Vasc Access Right  Result Date: 09/06/2017 INDICATION: End-stage renal disease and clotted right upper extremity graft. EXAM: DIALYSIS GRAFT DECLOT; GRAFT/VENOUS PTA ULTRASOUND GUIDANCE FOR VASCULAR ACCESS Physician: Stephan Minister. Henn, MD MEDICATIONS: Heparin 3000 units, tPA 2 mg ANESTHESIA/SEDATION: No sedation FLUOROSCOPY TIME:  Fluoroscopy Time: 6 minutes 42 seconds (9 mGy). COMPLICATIONS: None immediate. PROCEDURE: Informed consent was obtained for a declot procedure by the patient's son. Right upper arm graft was occluded based on ultrasound and physical examination. The right upper arm was prepped and draped in a sterile fashion. Maximal barrier sterile technique was utilized including caps, mask, sterile gowns, sterile gloves, sterile drape, hand hygiene and skin antiseptic. The skin was anesthetized with 1% lidocaine. The graft was accessed using 21 gauge needles towards the venous and arterial anastomoses with ultrasound guidance. Ultrasound images were obtained for documentation. Micropuncture catheters were placed. 2 mg of TPA was infused through the micropuncture catheters. The vascular access pointing towards the central veins was upsized to a 6-French vascular sheath. A 5-French catheter was advanced into the central venous structures and a central venogram was performed. Fluoroscopic images were saved for documentation. The catheter pointing toward the arterial anastomosis was exchanged for a 6-French sheath. The graft was treated with the  Arrow PTD thrombectomy device. A wire was advanced into the arterial system. The arterial plug was pulled using a 5 [place] Fogarty balloon. There was flow in the graft. However, there was no outflow through the graft. Five French catheter was advanced into the central venous system and a pull-back venogram was performed. High-grade stenosis near the venous anastomosis was identified. Bentson wire was placed across the stenosis. The stenosis was treated with a 6 mm x 40 mm Conquest balloon. There was a significant waist formation at the stenosis. The balloon was fully inflated at high pressure. Follow-up shuntogram images were obtained. The area of stenosis was treated a second time with the 6 mm balloon for 2 minutes. Follow-up  shuntogram images were obtained. Five French catheter was advanced into the brachial artery and shuntogram images obtained. There was noted to be preferential flow from the artery into the graft rather than into the forearm. Catheter was positioned just distal to the graft and another angiogram was performed. Vascular catheter was removed. Both vascular sheaths were removed with pursestring sutures. There was hemostasis at the sutures. FINDINGS: Occluded right upper arm arc graft. There was a high-grade stenosis at the venous anastomosis and outflow vein just beyond the anastomosis, greater than 70%. This high-grade stenosis was successfully treated with a 6 mm balloon and no significant stenosis at the end of the procedure. Excellent flow throughout the graft at the end of the procedure. Final shuntogram images also demonstrated preferential arterial flow into the graft rather than the forearm. Ultrasound was used to evaluate the forearm arterial structures. The brachial artery distal to the graft was patent based on ultrasound and color Doppler flow. There is also color Doppler flow and a pulse within the radial and ulnar arteries in the forearm. The radial artery had a weak pulse but there  was flow within the radial artery. Atherosclerotic plaque in the distal right radial artery. IMPRESSION: Successful declot of the right upper extremity graft. Preferential arterial flow into the graft following the declot procedure. The right forearm arteries are patent but there is diffuse atherosclerotic disease in the distal radial artery. According to the patient's son, the patient does not complain of right hand symptoms. However, this preferential arterial flow through the graft could put the patient at risk for steal syndrome. Electronically Signed   By: Markus Daft M.D.   On: 09/06/2017 17:15   Dg Hand Complete Right  Result Date: 09/08/2017 CLINICAL DATA:  Fall EXAM: RIGHT HAND - COMPLETE 3+ VIEW COMPARISON:  None. FINDINGS: Osteopenia. No fracture or dislocation is seen. The joint spaces are preserved. The visualized soft tissues are unremarkable. Vascular calcifications. IMPRESSION: No fracture or dislocation is seen. Osteopenia. Electronically Signed   By: Julian Hy M.D.   On: 09/08/2017 14:06   Ir Thrombectomy Av Fistula/w Thrombolysis/pta Inc Shunt/img Right  Result Date: 09/06/2017 INDICATION: End-stage renal disease and clotted right upper extremity graft. EXAM: DIALYSIS GRAFT DECLOT; GRAFT/VENOUS PTA ULTRASOUND GUIDANCE FOR VASCULAR ACCESS Physician: Stephan Minister. Henn, MD MEDICATIONS: Heparin 3000 units, tPA 2 mg ANESTHESIA/SEDATION: No sedation FLUOROSCOPY TIME:  Fluoroscopy Time: 6 minutes 42 seconds (9 mGy). COMPLICATIONS: None immediate. PROCEDURE: Informed consent was obtained for a declot procedure by the patient's son. Right upper arm graft was occluded based on ultrasound and physical examination. The right upper arm was prepped and draped in a sterile fashion. Maximal barrier sterile technique was utilized including caps, mask, sterile gowns, sterile gloves, sterile drape, hand hygiene and skin antiseptic. The skin was anesthetized with 1% lidocaine. The graft was accessed using  21 gauge needles towards the venous and arterial anastomoses with ultrasound guidance. Ultrasound images were obtained for documentation. Micropuncture catheters were placed. 2 mg of TPA was infused through the micropuncture catheters. The vascular access pointing towards the central veins was upsized to a 6-French vascular sheath. A 5-French catheter was advanced into the central venous structures and a central venogram was performed. Fluoroscopic images were saved for documentation. The catheter pointing toward the arterial anastomosis was exchanged for a 6-French sheath. The graft was treated with the Arrow PTD thrombectomy device. A wire was advanced into the arterial system. The arterial plug was pulled using a 5 [place] Fogarty balloon. There  was flow in the graft. However, there was no outflow through the graft. Five French catheter was advanced into the central venous system and a pull-back venogram was performed. High-grade stenosis near the venous anastomosis was identified. Bentson wire was placed across the stenosis. The stenosis was treated with a 6 mm x 40 mm Conquest balloon. There was a significant waist formation at the stenosis. The balloon was fully inflated at high pressure. Follow-up shuntogram images were obtained. The area of stenosis was treated a second time with the 6 mm balloon for 2 minutes. Follow-up shuntogram images were obtained. Five French catheter was advanced into the brachial artery and shuntogram images obtained. There was noted to be preferential flow from the artery into the graft rather than into the forearm. Catheter was positioned just distal to the graft and another angiogram was performed. Vascular catheter was removed. Both vascular sheaths were removed with pursestring sutures. There was hemostasis at the sutures. FINDINGS: Occluded right upper arm arc graft. There was a high-grade stenosis at the venous anastomosis and outflow vein just beyond the anastomosis, greater  than 70%. This high-grade stenosis was successfully treated with a 6 mm balloon and no significant stenosis at the end of the procedure. Excellent flow throughout the graft at the end of the procedure. Final shuntogram images also demonstrated preferential arterial flow into the graft rather than the forearm. Ultrasound was used to evaluate the forearm arterial structures. The brachial artery distal to the graft was patent based on ultrasound and color Doppler flow. There is also color Doppler flow and a pulse within the radial and ulnar arteries in the forearm. The radial artery had a weak pulse but there was flow within the radial artery. Atherosclerotic plaque in the distal right radial artery. IMPRESSION: Successful declot of the right upper extremity graft. Preferential arterial flow into the graft following the declot procedure. The right forearm arteries are patent but there is diffuse atherosclerotic disease in the distal radial artery. According to the patient's son, the patient does not complain of right hand symptoms. However, this preferential arterial flow through the graft could put the patient at risk for steal syndrome. Electronically Signed   By: Markus Daft M.D.   On: 09/06/2017 17:15    Procedures Procedures (including critical care time)  Medications Ordered in ED Medications  Tdap (BOOSTRIX) injection 0.5 mL (0.5 mLs Intramuscular Given 09/08/17 1411)     Initial Impression / Assessment and Plan / ED Course  I have reviewed the triage vital signs and the nursing notes.  Pertinent labs & imaging results that were available during my care of the patient were reviewed by me and considered in my medical decision making (see chart for details).  Clinical Course as of Sep 08 1548  Wed Sep 08, 2736  5341 77 year old female here with forehead contusion and abrasion to right hand after her feet were stuck wheeling into the hospital.  She was here for suture removal.  There is no loss of  consciousness.  CT head today shows no evidence of fracture or intracranial hematoma.  She has a superficial abrasion that does not need laceration repair.  Imaging otherwise negative.  She was initially recorded as hypotensive here.  She had just taken all of her blood pressure medicines and repeats have all been within normal limits.  Family is adamant she was completely well prior to and since the fall and is at her mental baseline.  She has no new focal deficits.  Suspect  this was purely mechanical and incidental low blood pressure, possibly erroneous versus secondary to just taking all of her blood pressure meds.  She is had no further episodes of hypotension, and she has a known history of labile blood pressure.  No apparent emergent pathology.   [CI]  5573 Regarding her sutures, I discussed with IR.  Sutures removed uneventfully.  No bleeding.  Patient has used her fistula since the angioplasty without difficulty.   [CI]  2202 Patient remains at her baseline is requesting for discharge.  Given reassuring labs, vitals, and otherwise well appearance with negative imaging, will discharge home.   [CI]    Clinical Course User Index [CI] Duffy Bruce, MD     Final Clinical Impressions(s) / ED Diagnoses   Final diagnoses:  Injury of head, initial encounter  Abrasion of right hand, initial encounter    ED Discharge Orders    None       Duffy Bruce, MD 09/08/17 1550

## 2017-09-09 DIAGNOSIS — Z992 Dependence on renal dialysis: Secondary | ICD-10-CM | POA: Diagnosis not present

## 2017-09-09 DIAGNOSIS — D631 Anemia in chronic kidney disease: Secondary | ICD-10-CM | POA: Diagnosis not present

## 2017-09-09 DIAGNOSIS — N2581 Secondary hyperparathyroidism of renal origin: Secondary | ICD-10-CM | POA: Diagnosis not present

## 2017-09-09 DIAGNOSIS — D509 Iron deficiency anemia, unspecified: Secondary | ICD-10-CM | POA: Diagnosis not present

## 2017-09-09 DIAGNOSIS — N186 End stage renal disease: Secondary | ICD-10-CM | POA: Diagnosis not present

## 2017-09-11 DIAGNOSIS — N2581 Secondary hyperparathyroidism of renal origin: Secondary | ICD-10-CM | POA: Diagnosis not present

## 2017-09-11 DIAGNOSIS — D509 Iron deficiency anemia, unspecified: Secondary | ICD-10-CM | POA: Diagnosis not present

## 2017-09-11 DIAGNOSIS — Z992 Dependence on renal dialysis: Secondary | ICD-10-CM | POA: Diagnosis not present

## 2017-09-11 DIAGNOSIS — N186 End stage renal disease: Secondary | ICD-10-CM | POA: Diagnosis not present

## 2017-09-11 DIAGNOSIS — D631 Anemia in chronic kidney disease: Secondary | ICD-10-CM | POA: Diagnosis not present

## 2017-09-13 DIAGNOSIS — E7849 Other hyperlipidemia: Secondary | ICD-10-CM | POA: Diagnosis not present

## 2017-09-13 DIAGNOSIS — F3289 Other specified depressive episodes: Secondary | ICD-10-CM | POA: Diagnosis not present

## 2017-09-13 DIAGNOSIS — I10 Essential (primary) hypertension: Secondary | ICD-10-CM | POA: Diagnosis not present

## 2017-09-13 DIAGNOSIS — I5032 Chronic diastolic (congestive) heart failure: Secondary | ICD-10-CM | POA: Diagnosis not present

## 2017-09-14 DIAGNOSIS — Z992 Dependence on renal dialysis: Secondary | ICD-10-CM | POA: Diagnosis not present

## 2017-09-14 DIAGNOSIS — N186 End stage renal disease: Secondary | ICD-10-CM | POA: Diagnosis not present

## 2017-09-14 DIAGNOSIS — N2581 Secondary hyperparathyroidism of renal origin: Secondary | ICD-10-CM | POA: Diagnosis not present

## 2017-09-14 DIAGNOSIS — D509 Iron deficiency anemia, unspecified: Secondary | ICD-10-CM | POA: Diagnosis not present

## 2017-09-14 DIAGNOSIS — D631 Anemia in chronic kidney disease: Secondary | ICD-10-CM | POA: Diagnosis not present

## 2017-09-16 DIAGNOSIS — D509 Iron deficiency anemia, unspecified: Secondary | ICD-10-CM | POA: Diagnosis not present

## 2017-09-16 DIAGNOSIS — N186 End stage renal disease: Secondary | ICD-10-CM | POA: Diagnosis not present

## 2017-09-16 DIAGNOSIS — Z992 Dependence on renal dialysis: Secondary | ICD-10-CM | POA: Diagnosis not present

## 2017-09-16 DIAGNOSIS — D631 Anemia in chronic kidney disease: Secondary | ICD-10-CM | POA: Diagnosis not present

## 2017-09-16 DIAGNOSIS — N2581 Secondary hyperparathyroidism of renal origin: Secondary | ICD-10-CM | POA: Diagnosis not present

## 2017-09-18 DIAGNOSIS — D631 Anemia in chronic kidney disease: Secondary | ICD-10-CM | POA: Diagnosis not present

## 2017-09-18 DIAGNOSIS — N186 End stage renal disease: Secondary | ICD-10-CM | POA: Diagnosis not present

## 2017-09-18 DIAGNOSIS — D509 Iron deficiency anemia, unspecified: Secondary | ICD-10-CM | POA: Diagnosis not present

## 2017-09-18 DIAGNOSIS — N2581 Secondary hyperparathyroidism of renal origin: Secondary | ICD-10-CM | POA: Diagnosis not present

## 2017-09-18 DIAGNOSIS — Z992 Dependence on renal dialysis: Secondary | ICD-10-CM | POA: Diagnosis not present

## 2017-09-21 DIAGNOSIS — N2581 Secondary hyperparathyroidism of renal origin: Secondary | ICD-10-CM | POA: Diagnosis not present

## 2017-09-21 DIAGNOSIS — N186 End stage renal disease: Secondary | ICD-10-CM | POA: Diagnosis not present

## 2017-09-21 DIAGNOSIS — Z992 Dependence on renal dialysis: Secondary | ICD-10-CM | POA: Diagnosis not present

## 2017-09-21 DIAGNOSIS — D631 Anemia in chronic kidney disease: Secondary | ICD-10-CM | POA: Diagnosis not present

## 2017-09-21 DIAGNOSIS — D509 Iron deficiency anemia, unspecified: Secondary | ICD-10-CM | POA: Diagnosis not present

## 2017-09-22 DIAGNOSIS — N186 End stage renal disease: Secondary | ICD-10-CM | POA: Diagnosis not present

## 2017-09-22 DIAGNOSIS — Z992 Dependence on renal dialysis: Secondary | ICD-10-CM | POA: Diagnosis not present

## 2017-09-23 DIAGNOSIS — D631 Anemia in chronic kidney disease: Secondary | ICD-10-CM | POA: Diagnosis not present

## 2017-09-23 DIAGNOSIS — Z992 Dependence on renal dialysis: Secondary | ICD-10-CM | POA: Diagnosis not present

## 2017-09-23 DIAGNOSIS — N186 End stage renal disease: Secondary | ICD-10-CM | POA: Diagnosis not present

## 2017-09-23 DIAGNOSIS — D509 Iron deficiency anemia, unspecified: Secondary | ICD-10-CM | POA: Diagnosis not present

## 2017-09-23 DIAGNOSIS — N2581 Secondary hyperparathyroidism of renal origin: Secondary | ICD-10-CM | POA: Diagnosis not present

## 2017-09-25 DIAGNOSIS — Z992 Dependence on renal dialysis: Secondary | ICD-10-CM | POA: Diagnosis not present

## 2017-09-25 DIAGNOSIS — D509 Iron deficiency anemia, unspecified: Secondary | ICD-10-CM | POA: Diagnosis not present

## 2017-09-25 DIAGNOSIS — N186 End stage renal disease: Secondary | ICD-10-CM | POA: Diagnosis not present

## 2017-09-25 DIAGNOSIS — N2581 Secondary hyperparathyroidism of renal origin: Secondary | ICD-10-CM | POA: Diagnosis not present

## 2017-09-25 DIAGNOSIS — D631 Anemia in chronic kidney disease: Secondary | ICD-10-CM | POA: Diagnosis not present

## 2017-09-28 ENCOUNTER — Other Ambulatory Visit: Payer: Self-pay | Admitting: Radiology

## 2017-09-28 ENCOUNTER — Other Ambulatory Visit (HOSPITAL_COMMUNITY): Payer: Self-pay | Admitting: Nephrology

## 2017-09-28 DIAGNOSIS — N186 End stage renal disease: Secondary | ICD-10-CM

## 2017-09-28 DIAGNOSIS — Z992 Dependence on renal dialysis: Principal | ICD-10-CM

## 2017-09-29 ENCOUNTER — Ambulatory Visit (HOSPITAL_COMMUNITY)
Admission: RE | Admit: 2017-09-29 | Discharge: 2017-09-29 | Disposition: A | Discharge status: Home / Self Care | Payer: Medicare Other | Source: Ambulatory Visit | Attending: Nephrology | Admitting: Nephrology

## 2017-09-29 ENCOUNTER — Encounter (HOSPITAL_COMMUNITY): Payer: Self-pay

## 2017-09-29 ENCOUNTER — Other Ambulatory Visit (HOSPITAL_COMMUNITY): Payer: Self-pay | Admitting: Nephrology

## 2017-09-29 DIAGNOSIS — Y832 Surgical operation with anastomosis, bypass or graft as the cause of abnormal reaction of the patient, or of later complication, without mention of misadventure at the time of the procedure: Secondary | ICD-10-CM | POA: Insufficient documentation

## 2017-09-29 DIAGNOSIS — Z8673 Personal history of transient ischemic attack (TIA), and cerebral infarction without residual deficits: Secondary | ICD-10-CM | POA: Diagnosis not present

## 2017-09-29 DIAGNOSIS — Z7901 Long term (current) use of anticoagulants: Secondary | ICD-10-CM | POA: Insufficient documentation

## 2017-09-29 DIAGNOSIS — I132 Hypertensive heart and chronic kidney disease with heart failure and with stage 5 chronic kidney disease, or end stage renal disease: Secondary | ICD-10-CM | POA: Insufficient documentation

## 2017-09-29 DIAGNOSIS — Z91018 Allergy to other foods: Secondary | ICD-10-CM | POA: Insufficient documentation

## 2017-09-29 DIAGNOSIS — N186 End stage renal disease: Secondary | ICD-10-CM | POA: Diagnosis not present

## 2017-09-29 DIAGNOSIS — Z9842 Cataract extraction status, left eye: Secondary | ICD-10-CM | POA: Diagnosis not present

## 2017-09-29 DIAGNOSIS — Z9889 Other specified postprocedural states: Secondary | ICD-10-CM | POA: Diagnosis not present

## 2017-09-29 DIAGNOSIS — Z7982 Long term (current) use of aspirin: Secondary | ICD-10-CM | POA: Diagnosis not present

## 2017-09-29 DIAGNOSIS — Z8249 Family history of ischemic heart disease and other diseases of the circulatory system: Secondary | ICD-10-CM | POA: Diagnosis not present

## 2017-09-29 DIAGNOSIS — I252 Old myocardial infarction: Secondary | ICD-10-CM | POA: Insufficient documentation

## 2017-09-29 DIAGNOSIS — Z9841 Cataract extraction status, right eye: Secondary | ICD-10-CM | POA: Diagnosis not present

## 2017-09-29 DIAGNOSIS — I2510 Atherosclerotic heart disease of native coronary artery without angina pectoris: Secondary | ICD-10-CM | POA: Diagnosis not present

## 2017-09-29 DIAGNOSIS — I509 Heart failure, unspecified: Secondary | ICD-10-CM | POA: Diagnosis not present

## 2017-09-29 DIAGNOSIS — E785 Hyperlipidemia, unspecified: Secondary | ICD-10-CM | POA: Diagnosis not present

## 2017-09-29 DIAGNOSIS — Z992 Dependence on renal dialysis: Principal | ICD-10-CM

## 2017-09-29 DIAGNOSIS — Z79899 Other long term (current) drug therapy: Secondary | ICD-10-CM | POA: Diagnosis not present

## 2017-09-29 DIAGNOSIS — E1122 Type 2 diabetes mellitus with diabetic chronic kidney disease: Secondary | ICD-10-CM | POA: Insufficient documentation

## 2017-09-29 DIAGNOSIS — T82868A Thrombosis of vascular prosthetic devices, implants and grafts, initial encounter: Secondary | ICD-10-CM | POA: Diagnosis not present

## 2017-09-29 DIAGNOSIS — E1151 Type 2 diabetes mellitus with diabetic peripheral angiopathy without gangrene: Secondary | ICD-10-CM | POA: Diagnosis not present

## 2017-09-29 DIAGNOSIS — T82858A Stenosis of vascular prosthetic devices, implants and grafts, initial encounter: Secondary | ICD-10-CM | POA: Diagnosis not present

## 2017-09-29 HISTORY — PX: IR THROMBECTOMY AV FISTULA W/THROMBOLYSIS/PTA INC/SHUNT/IMG RIGHT: IMG6119

## 2017-09-29 HISTORY — PX: IR US GUIDE VASC ACCESS RIGHT: IMG2390

## 2017-09-29 MED ORDER — HEPARIN SODIUM (PORCINE) 1000 UNIT/ML IJ SOLN
INTRAMUSCULAR | Status: AC
  Filled 2017-09-29: qty 1

## 2017-09-29 MED ORDER — ALTEPLASE 2 MG IJ SOLR
INTRAMUSCULAR | Status: AC
  Filled 2017-09-29: qty 2

## 2017-09-29 MED ORDER — HEPARIN SODIUM (PORCINE) 1000 UNIT/ML IJ SOLN
INTRAMUSCULAR | Status: DC | PRN
  Administered 2017-09-29: 3000 [IU] via INTRAVENOUS

## 2017-09-29 MED ORDER — LIDOCAINE HCL 1 % IJ SOLN
INTRAMUSCULAR | Status: AC
  Filled 2017-09-29: qty 20

## 2017-09-29 MED ORDER — IOPAMIDOL (ISOVUE-300) INJECTION 61%
INTRAVENOUS | Status: AC
  Administered 2017-09-29: 40 mL
  Filled 2017-09-29: qty 100

## 2017-09-29 MED ORDER — ALTEPLASE 2 MG IJ SOLR
INTRAMUSCULAR | Status: DC | PRN
  Administered 2017-09-29: 2 mg

## 2017-09-29 MED ORDER — FENTANYL CITRATE (PF) 100 MCG/2ML IJ SOLN
INTRAMUSCULAR | Status: AC
  Filled 2017-09-29: qty 2

## 2017-09-29 MED ORDER — HEPARIN SODIUM (PORCINE) 1000 UNIT/ML IJ SOLN
INTRAMUSCULAR | Status: DC
Start: 2017-09-29 — End: 2017-09-30
  Filled 2017-09-29: qty 1

## 2017-09-29 MED ORDER — LIDOCAINE HCL 1 % IJ SOLN
INTRAMUSCULAR | Status: DC | PRN
  Administered 2017-09-29: 5 mL

## 2017-09-29 MED ORDER — MIDAZOLAM HCL 2 MG/2ML IJ SOLN
INTRAMUSCULAR | Status: AC
  Filled 2017-09-29: qty 2

## 2017-09-29 MED ORDER — SODIUM CHLORIDE 0.9 % IV SOLN: INTRAVENOUS | Status: DC

## 2017-09-29 NOTE — H&P (Signed)
Chief Complaint: Patient was seen in consultation today for right upper arm dialysis graft thrombolysis at the request of [place]  Referring Physician(s): Fran Lowes  Supervising Physician: Markus Daft  Patient Status: [facility] - Out-pt  History of Present Illness: Courtney [name] is a 77 y.o. female   ESRD RUA dialysis fistula Placed 2017 Last use Sat last week-- no issue. Went for treatment yesterday, graft was occluded Last declotted 7/15  Scheduled for RUA dialysis fistula thrombolysis with possible angioplasty/stent placement Possible tunneled dialysis catheter placement  Past Medical History:  Diagnosis Date  . CHF (congestive heart failure) ([place])   . Coronary artery disease   . Diabetes mellitus without complication ([place])   . ESRD on dialysis ([place]) 02/10/2017  . Hyperlipidemia   . Hypertension   . Myocardial infarct ([place])   . Stroke [facility])    2-3 yrs ago- right sided weakness    Past Surgical History:  Procedure Laterality Date  . APPENDECTOMY    . AV FISTULA PLACEMENT Right 09/20/2015   Procedure: PLACEMENT OF RIGHT UPPER EXTREMITY ARTERIOVENOUS GORE-TEX GRAFT FOR HEMODIALYSIS ACCESS;  Surgeon: Vickie Epley, MD;  Location: [facility] ORS;  Service: Vascular;  Laterality: Right;  . CATARACT EXTRACTION W/PHACO Left 06/29/2013   Procedure: CATARACT EXTRACTION PHACO AND INTRAOCULAR LENS PLACEMENT (IOC);  Surgeon: Tonny Branch, MD;  Location: [facility];  Service: Ophthalmology;  Laterality: Left;  CDE 12.25  . CATARACT EXTRACTION W/PHACO Right 07/27/2013   Procedure: CATARACT EXTRACTION PHACO AND INTRAOCULAR LENS PLACEMENT (IOC);  Surgeon: Tonny Branch, MD;  Location: [facility];  Service: Ophthalmology;  Laterality: Right;  CDE:7.72  . INSERTION OF DIALYSIS CATHETER Right 09/11/2015   Procedure: INSERTION OF TUNNELED DIALYSIS CATHETER;  Surgeon: Vickie Epley, MD;  Location: [facility];  Service: Vascular;  Laterality: Right;  . INTRAMEDULLARY (IM) NAIL INTERTROCHANTERIC Right  07/20/2012   Procedure: INTRAMEDULLARY (IM) NAIL INTERTROCHANTRIC;  Surgeon: Carole Civil, MD;  Location: [facility];  Service: Orthopedics;  Laterality: Right;  . IR GENERIC HISTORICAL  09/25/2015   IR US GUIDE VASC ACCESS RIGHT 09/25/2015 Sandi Mariscal, MD [facility]  . IR GENERIC HISTORICAL  09/25/2015   IR FLUORO GUIDE CV LINE RIGHT 09/25/2015 Sandi Mariscal, MD [facility]  . IR GENERIC HISTORICAL  09/25/2015   IR REMOVAL TUN CV CATH W/O FL 09/25/2015 Sandi Mariscal, MD [facility]  . IR THROMBECTOMY AV FISTULA W/THROMBOLYSIS/PTA INC/SHUNT/IMG RIGHT Right 09/06/2017  . IR US GUIDE VASC ACCESS RIGHT  09/06/2017    Allergies: Grapefruit flavor [flavoring agent]  Medications: Prior to Admission medications   Medication Sig Start Date End Date Taking? Authorizing Provider  aspirin EC 81 MG tablet Take 81 mg by mouth daily.   Yes [provider]  atorvastatin (LIPITOR) 20 MG tablet Take 20 mg by mouth daily.   Yes [provider]  calcium-vitamin D (OSCAL WITH D) 500-200 MG-UNIT tablet Take 1 tablet by mouth.   Yes [provider]  cholecalciferol (VITAMIN D) 400 units TABS tablet Take 1,000 Units by mouth daily.    Yes [provider]  clopidogrel (PLAVIX) 75 MG tablet Take 1 tablet (75 mg total) by mouth daily. 07/14/17 07/14/18 Yes Purohit, Konrad Dolores, MD  escitalopram (LEXAPRO) 5 MG tablet Take 5 mg by mouth daily.   Yes [provider]  furosemide (LASIX) 20 MG tablet Take 20 mg by mouth daily.    Yes [provider]  hydrALAZINE (APRESOLINE) 25 MG tablet Take 25 mg by mouth 3 (three) times  daily. 07/22/12  Yes Kathie Dike, MD  isosorbide mononitrate (IMDUR) 30 MG 24 hr tablet Take 30 mg by mouth daily.   Yes [provider]  K Phos Mono-Sod Phos Di & Mono (PHOSPHA 250 NEUTRAL) 155-852-130 MG TABS Take 1 tablet by mouth daily.    Yes [provider]  loperamide (IMODIUM A-D) 2 MG tablet Take 2 mg by mouth every 6 (six) hours as  needed for diarrhea or loose stools.   Yes [provider]  omeprazole (PRILOSEC) 40 MG capsule Take 1 capsule (40 mg total) by mouth daily. 02/10/17  Yes Setzer, Rona Ravens, NP  oxyCODONE-acetaminophen (ROXICET) 5-325 MG tablet Take 1 tablet by mouth every 6 (six) hours as needed for severe pain. 09/20/15   Vickie Epley, MD     Family History  Problem Relation Age of Onset  . Heart disease Mother   . Heart disease Father     Social History   Socioeconomic History  . Marital status: Single    Spouse name: Not on file  . Number of children: Not on file  . Years of education: Not on file  . Highest education level: Not on file  Occupational History  . Not on file  Social Needs  . Financial resource strain: Not on file  . Food insecurity:    Worry: Not on file    Inability: Not on file  . Transportation needs:    Medical: Not on file    Non-medical: Not on file  Tobacco Use  . Smoking status: Never Smoker  . Smokeless tobacco: Never Used  Substance and Sexual Activity  . Alcohol use: No  . Drug use: No  . Sexual activity: Never    Birth control/protection: None  Lifestyle  . Physical activity:    Days per week: Not on file    Minutes per session: Not on file  . Stress: Not on file  Relationships  . Social connections:    Talks on phone: Not on file    Gets together: Not on file    Attends religious service: Not on file    Active member of club or organization: Not on file    Attends meetings of clubs or organizations: Not on file    Relationship status: Not on file  Other Topics Concern  . Not on file  Social History Narrative  . Not on file    Review of Systems: A 12 point ROS discussed and pertinent positives are indicated in the HPI above.  All other systems are negative.  Review of Systems  Constitutional: Positive for fatigue. Negative for activity change and fever.  Respiratory: Negative for shortness of breath.   Cardiovascular: Negative for  chest pain.  Gastrointestinal: Negative for abdominal pain.  Musculoskeletal: Positive for gait problem.  Neurological: Positive for weakness.  Psychiatric/Behavioral: Negative for behavioral problems and confusion.    Vital Signs: BP (!) 150/78   Pulse 69   Temp 97.7 F (36.5 C) (Oral)   Ht 4\' 11"  (1.499 m)   SpO2 100%   BMI 14.95 kg/m   Physical Exam  Constitutional: She is oriented to person, place, and time.  Frail; ill apparimg  Cardiovascular: Normal rate and regular rhythm.  Pulmonary/Chest: Effort normal and breath sounds normal.  Abdominal: Soft. Bowel sounds are normal.  Musculoskeletal: Normal range of motion.  Right side weakness from CVA  Neurological: She is alert and oriented to person, place, and time.  Skin: Skin is warm and dry.  Psychiatric: She has a normal mood and affect. Her behavior is normal. Judgment and thought content normal.  Signed consent with son at bedside  Nursing note and vitals reviewed.   Imaging: Dg Chest 2 View  Result Date: 09/08/2017 CLINICAL DATA:  Fall, weakness EXAM: CHEST - 2 VIEW COMPARISON:  07/14/2017 FINDINGS: Cardiomegaly with mild interstitial edema. Small bilateral pleural effusions, left greater than right, decreased. Visualized osseous structures are within normal limits. IMPRESSION: Cardiomegaly with mild interstitial edema. Small bilateral pleural effusions, left greater than right, decreased. Electronically Signed   By: Julian Hy M.D.   On: 09/08/2017 14:06   Ct Head Wo Contrast  Result Date: 09/08/2017 CLINICAL DATA:  Pain following fall EXAM: CT HEAD WITHOUT CONTRAST TECHNIQUE: Contiguous axial images were obtained from the base of the skull through the vertex without intravenous contrast. COMPARISON:  Head CT May 31, 2017; brain MRI Jul 13, 2017 FINDINGS: Brain: There is moderate diffuse atrophy. There is no intracranial mass, hemorrhage, extra-axial fluid collection, or midline shift. There is a prior infarct  in the posterior mid right cerebellum. There is extensive small vessel disease throughout the centra semiovale bilaterally. There is a prior lacunar infarct in the anterior limb left internal capsule. There is a prior infarct in the left thalamus. There is patchy small vessel disease in each external capsule. No acute appearing infarct is evident on this current study. Vascular: No hyperdense vessel. There is calcification in each distal vertebral artery and carotid siphon regions. Skull: There is a sizable right frontal scalp hematoma. Bony calvarium appears intact. Sinuses/Orbits: There is mucosal thickening in several ethmoid air cells. Other visualized paranasal sinuses are clear. Visualized orbits appear symmetric bilaterally. Other: Mastoid air cells are clear. IMPRESSION: 1. Atrophy with prior infarcts and extensive periventricular small vessel disease. No acute infarct evident. No mass or hemorrhage. No extra-axial fluid. 2.  Right frontal scalp hematoma.  No fracture evident. 3.  Multiple foci of arterial vascular calcification. 4.  Mucosal thickening in several ethmoid air cells. Electronically Signed   By: Lowella Grip III M.D.   On: 09/08/2017 12:15   Ct Cervical Spine Wo Contrast  Result Date: 09/08/2017 CLINICAL DATA:  Initial evaluation for acute trauma, fall. EXAM: CT CERVICAL SPINE WITHOUT CONTRAST TECHNIQUE: Multidetector CT imaging of the cervical spine was performed without intravenous contrast. Multiplanar CT image reconstructions were also generated. COMPARISON:  None available. FINDINGS: Alignment: Straightening with reversal of the normal cervical lordosis, apex at C5. The left convex scoliosis. 3 mm anterolisthesis of C3 on C4, chronic and facet mediated. Skull base and vertebrae: Skull base intact. Normal C1-2 articulations are preserved. Dens is intact. Congenital fusion of the C2 and C3 vertebral bodies noted. Vertebral body heights maintained. No acute fracture. No discrete  osseous lesions. Soft tissues and spinal canal: Paraspinous soft tissues demonstrate no acute abnormality. No abnormal prevertebral edema. Vascular calcifications present about the carotid bifurcations. Disc levels: Prominent degenerative thickening at the tectorial membrane. Moderate cervical spondylolysis at C5-6 through C7-T1. Multilevel facet arthrosis throughout the upper cervical spine with associated ankylosis, right greater than left. Upper chest: Large layering bilateral pleural effusions partially visualized, left greater than right. Other: None. IMPRESSION: 1. No acute traumatic injury within the cervical spine. 2. Congenital fusion of C2-3. 3. Levoscoliosis with reversal of the normal cervical lordosis and moderate multilevel cervical spondylolysis, greatest at C5-6 through C7-T1. 4. Large layering bilateral pleural effusions. Electronically Signed   By: Jeannine Boga M.D.   On: 09/08/2017 13:55  Ir US Guide Vasc Access Right  Result Date: 09/06/2017 INDICATION: End-stage renal disease and clotted right upper extremity graft. EXAM: DIALYSIS GRAFT DECLOT; GRAFT/VENOUS PTA ULTRASOUND GUIDANCE FOR VASCULAR ACCESS Physician: Stephan Minister. Henn, MD MEDICATIONS: Heparin 3000 units, tPA 2 mg ANESTHESIA/SEDATION: No sedation FLUOROSCOPY TIME:  Fluoroscopy Time: 6 minutes 42 seconds (9 mGy). COMPLICATIONS: None immediate. PROCEDURE: Informed consent was obtained for a declot procedure by the patient's son. Right upper arm graft was occluded based on ultrasound and physical examination. The right upper arm was prepped and draped in a sterile fashion. Maximal barrier sterile technique was utilized including caps, mask, sterile gowns, sterile gloves, sterile drape, hand hygiene and skin antiseptic. The skin was anesthetized with 1% lidocaine. The graft was accessed using 21 gauge needles towards the venous and arterial anastomoses with ultrasound guidance. Ultrasound images were obtained for documentation.  Micropuncture catheters were placed. 2 mg of TPA was infused through the micropuncture catheters. The vascular access pointing towards the central veins was upsized to a 6-French vascular sheath. A 5-French catheter was advanced into the central venous structures and a central venogram was performed. Fluoroscopic images were saved for documentation. The catheter pointing toward the arterial anastomosis was exchanged for a 6-French sheath. The graft was treated with the Arrow PTD thrombectomy device. A wire was advanced into the arterial system. The arterial plug was pulled using a 5 [place] Fogarty balloon. There was flow in the graft. However, there was no outflow through the graft. Five French catheter was advanced into the central venous system and a pull-back venogram was performed. High-grade stenosis near the venous anastomosis was identified. Bentson wire was placed across the stenosis. The stenosis was treated with a 6 mm x 40 mm Conquest balloon. There was a significant waist formation at the stenosis. The balloon was fully inflated at high pressure. Follow-up shuntogram images were obtained. The area of stenosis was treated a second time with the 6 mm balloon for 2 minutes. Follow-up shuntogram images were obtained. Five French catheter was advanced into the brachial artery and shuntogram images obtained. There was noted to be preferential flow from the artery into the graft rather than into the forearm. Catheter was positioned just distal to the graft and another angiogram was performed. Vascular catheter was removed. Both vascular sheaths were removed with pursestring sutures. There was hemostasis at the sutures. FINDINGS: Occluded right upper arm arc graft. There was a high-grade stenosis at the venous anastomosis and outflow vein just beyond the anastomosis, greater than 70%. This high-grade stenosis was successfully treated with a 6 mm balloon and no significant stenosis at the end of the procedure.  Excellent flow throughout the graft at the end of the procedure. Final shuntogram images also demonstrated preferential arterial flow into the graft rather than the forearm. Ultrasound was used to evaluate the forearm arterial structures. The brachial artery distal to the graft was patent based on ultrasound and color Doppler flow. There is also color Doppler flow and a pulse within the radial and ulnar arteries in the forearm. The radial artery had a weak pulse but there was flow within the radial artery. Atherosclerotic plaque in the distal right radial artery. IMPRESSION: Successful declot of the right upper extremity graft. Preferential arterial flow into the graft following the declot procedure. The right forearm arteries are patent but there is diffuse atherosclerotic disease in the distal radial artery. According to the patient's son, the patient does not complain of right hand symptoms. However, this preferential arterial flow  through the graft could put the patient at risk for steal syndrome. Electronically Signed   By: Markus Daft M.D.   On: 09/06/2017 17:15   Dg Hand Complete Right  Result Date: 09/08/2017 CLINICAL DATA:  Fall EXAM: RIGHT HAND - COMPLETE 3+ VIEW COMPARISON:  None. FINDINGS: Osteopenia. No fracture or dislocation is seen. The joint spaces are preserved. The visualized soft tissues are unremarkable. Vascular calcifications. IMPRESSION: No fracture or dislocation is seen. Osteopenia. Electronically Signed   By: Julian Hy M.D.   On: 09/08/2017 14:06   Ir Thrombectomy Av Fistula/w Thrombolysis/pta Inc Shunt/img Right  Result Date: 09/06/2017 INDICATION: End-stage renal disease and clotted right upper extremity graft. EXAM: DIALYSIS GRAFT DECLOT; GRAFT/VENOUS PTA ULTRASOUND GUIDANCE FOR VASCULAR ACCESS Physician: Stephan Minister. Henn, MD MEDICATIONS: Heparin 3000 units, tPA 2 mg ANESTHESIA/SEDATION: No sedation FLUOROSCOPY TIME:  Fluoroscopy Time: 6 minutes 42 seconds (9 mGy).  COMPLICATIONS: None immediate. PROCEDURE: Informed consent was obtained for a declot procedure by the patient's son. Right upper arm graft was occluded based on ultrasound and physical examination. The right upper arm was prepped and draped in a sterile fashion. Maximal barrier sterile technique was utilized including caps, mask, sterile gowns, sterile gloves, sterile drape, hand hygiene and skin antiseptic. The skin was anesthetized with 1% lidocaine. The graft was accessed using 21 gauge needles towards the venous and arterial anastomoses with ultrasound guidance. Ultrasound images were obtained for documentation. Micropuncture catheters were placed. 2 mg of TPA was infused through the micropuncture catheters. The vascular access pointing towards the central veins was upsized to a 6-French vascular sheath. A 5-French catheter was advanced into the central venous structures and a central venogram was performed. Fluoroscopic images were saved for documentation. The catheter pointing toward the arterial anastomosis was exchanged for a 6-French sheath. The graft was treated with the Arrow PTD thrombectomy device. A wire was advanced into the arterial system. The arterial plug was pulled using a 5 [place] Fogarty balloon. There was flow in the graft. However, there was no outflow through the graft. Five French catheter was advanced into the central venous system and a pull-back venogram was performed. High-grade stenosis near the venous anastomosis was identified. Bentson wire was placed across the stenosis. The stenosis was treated with a 6 mm x 40 mm Conquest balloon. There was a significant waist formation at the stenosis. The balloon was fully inflated at high pressure. Follow-up shuntogram images were obtained. The area of stenosis was treated a second time with the 6 mm balloon for 2 minutes. Follow-up shuntogram images were obtained. Five French catheter was advanced into the brachial artery and shuntogram images  obtained. There was noted to be preferential flow from the artery into the graft rather than into the forearm. Catheter was positioned just distal to the graft and another angiogram was performed. Vascular catheter was removed. Both vascular sheaths were removed with pursestring sutures. There was hemostasis at the sutures. FINDINGS: Occluded right upper arm arc graft. There was a high-grade stenosis at the venous anastomosis and outflow vein just beyond the anastomosis, greater than 70%. This high-grade stenosis was successfully treated with a 6 mm balloon and no significant stenosis at the end of the procedure. Excellent flow throughout the graft at the end of the procedure. Final shuntogram images also demonstrated preferential arterial flow into the graft rather than the forearm. Ultrasound was used to evaluate the forearm arterial structures. The brachial artery distal to the graft was patent based on ultrasound and color Doppler  flow. There is also color Doppler flow and a pulse within the radial and ulnar arteries in the forearm. The radial artery had a weak pulse but there was flow within the radial artery. Atherosclerotic plaque in the distal right radial artery. IMPRESSION: Successful declot of the right upper extremity graft. Preferential arterial flow into the graft following the declot procedure. The right forearm arteries are patent but there is diffuse atherosclerotic disease in the distal radial artery. According to the patient's son, the patient does not complain of right hand symptoms. However, this preferential arterial flow through the graft could put the patient at risk for steal syndrome. Electronically Signed   By: Markus Daft M.D.   On: 09/06/2017 17:15    Labs:  CBC: Recent Labs    07/13/17 1539 07/14/17 0432 09/06/17 1226 09/08/17 1408  WBC 5.8 6.1 4.8 4.2  HGB 10.4* 10.2* 10.6* 11.0*  HCT 32.0* 32.4* 34.0* 34.7*  PLT 174 167 146* 117*    COAGS: Recent Labs     07/13/17 1835 09/06/17 1226 09/08/17 1408  INR 1.07 1.09 1.16  APTT 28  --   --     BMP: Recent Labs    07/13/17 1539 07/14/17 0432 09/06/17 1226 09/08/17 1408  NA 134* 139 144 142  K 3.4* 3.5 4.3 3.4*  CL 98* 99* 108 104  CO2 27 28 25 26   GLUCOSE 85 80 72 80  BUN 16 19 45* 22  CALCIUM 8.2* 8.4* 8.1* 8.0*  CREATININE 3.56* 4.16* 7.19* 4.28*  GFRNONAA 12* 10* 5* 9*  GFRAA 13* 11* 6* 11*    LIVER FUNCTION TESTS: Recent Labs    02/10/17 1139 07/13/17 1539 07/14/17 0432  BILITOT 0.5 1.1  --   AST 15 21  --   ALT 6 15  --   ALKPHOS  --  38  --   PROT 5.5* 5.7*  --   ALBUMIN  --  2.6* 2.6*    TUMOR MARKERS: No results for input(s): AFPTM, CEA, CA199, CHROMGRNA in the last 8760 hours.  Assessment and Plan: RUA dialysis graft clotted again Last declotted 7/15 Now for declot with possible dialysis tunneled catheter placement Risks and benefits discussed with the patient including, but not limited to bleeding, infection, vascular injury, pulmonary embolism, need for tunneled HD catheter placement or even death.  All of the patient's questions were answered, patient is agreeable to proceed. Consent signed and in chart.   Thank you for this interesting consult.  I greatly enjoyed meeting Courtney [name] and look forward to participating in their care.  A copy of this report was sent to the requesting provider on this date.  Electronically Signed: Ascencion Dike, PA-C 09/29/2017, 1:02 PM   I spent a total of  30 Minutes   in face to face in clinical consultation, greater than 50% of which was counseling/coordinating care for RUA dialysis graft declot

## 2017-09-29 NOTE — Progress Notes (Signed)
Patient ID: Courtney [name], female   DOB: 17-Jan-1941, 77 y.o.   MRN: [id number] Purse string sutures were placed at end of declot procedure for hemostasis.  Attempted to remove the sutures 1 hour after procedure but there was excessive bleeding at puncture sites and developed hematoma along the upper puncture site.  Manual pressure was held on and off for another hour.  Eventually the bleeding stopped with V Pad on the lower puncture site.  Will plan to watch the puncture sites for at least another 30 minutes prior to sending patient home.  Sutures will need to removed tomorrow at dialysis center or primary care physician.

## 2017-09-29 NOTE — Sedation Documentation (Signed)
No sedation at this time per Dr. Anselm Pancoast.

## 2017-09-29 NOTE — Procedures (Signed)
  Pre-operative Diagnosis: ESRD with hemodialysis        Post-operative Diagnosis: ESRD with hemodialysis   Indications: Occluded right upper arm graft  Procedure: Right upper arm declot with thrombolysis and PTA  Findings: Occluded right upper arm graft.  Successful declot.  Venous anastomosis and outflow vein ballooned up to 7 mm.    Complications: None     EBL: Minimal  Plan: Remove purse string sutures in approximately 1 hour and then discharge

## 2017-09-30 DIAGNOSIS — D509 Iron deficiency anemia, unspecified: Secondary | ICD-10-CM | POA: Diagnosis not present

## 2017-09-30 DIAGNOSIS — N2581 Secondary hyperparathyroidism of renal origin: Secondary | ICD-10-CM | POA: Diagnosis not present

## 2017-09-30 DIAGNOSIS — D631 Anemia in chronic kidney disease: Secondary | ICD-10-CM | POA: Diagnosis not present

## 2017-09-30 DIAGNOSIS — Z992 Dependence on renal dialysis: Secondary | ICD-10-CM | POA: Diagnosis not present

## 2017-09-30 DIAGNOSIS — N186 End stage renal disease: Secondary | ICD-10-CM | POA: Diagnosis not present

## 2017-10-02 DIAGNOSIS — D509 Iron deficiency anemia, unspecified: Secondary | ICD-10-CM | POA: Diagnosis not present

## 2017-10-02 DIAGNOSIS — N186 End stage renal disease: Secondary | ICD-10-CM | POA: Diagnosis not present

## 2017-10-02 DIAGNOSIS — Z992 Dependence on renal dialysis: Secondary | ICD-10-CM | POA: Diagnosis not present

## 2017-10-02 DIAGNOSIS — D631 Anemia in chronic kidney disease: Secondary | ICD-10-CM | POA: Diagnosis not present

## 2017-10-02 DIAGNOSIS — N2581 Secondary hyperparathyroidism of renal origin: Secondary | ICD-10-CM | POA: Diagnosis not present

## 2017-10-04 DIAGNOSIS — Z86010 Personal history of colonic polyps: Secondary | ICD-10-CM | POA: Diagnosis not present

## 2017-10-05 DIAGNOSIS — D631 Anemia in chronic kidney disease: Secondary | ICD-10-CM | POA: Diagnosis not present

## 2017-10-05 DIAGNOSIS — D509 Iron deficiency anemia, unspecified: Secondary | ICD-10-CM | POA: Diagnosis not present

## 2017-10-05 DIAGNOSIS — Z992 Dependence on renal dialysis: Secondary | ICD-10-CM | POA: Diagnosis not present

## 2017-10-05 DIAGNOSIS — N186 End stage renal disease: Secondary | ICD-10-CM | POA: Diagnosis not present

## 2017-10-05 DIAGNOSIS — N2581 Secondary hyperparathyroidism of renal origin: Secondary | ICD-10-CM | POA: Diagnosis not present

## 2017-10-07 DIAGNOSIS — D631 Anemia in chronic kidney disease: Secondary | ICD-10-CM | POA: Diagnosis not present

## 2017-10-07 DIAGNOSIS — Z992 Dependence on renal dialysis: Secondary | ICD-10-CM | POA: Diagnosis not present

## 2017-10-07 DIAGNOSIS — D509 Iron deficiency anemia, unspecified: Secondary | ICD-10-CM | POA: Diagnosis not present

## 2017-10-07 DIAGNOSIS — N186 End stage renal disease: Secondary | ICD-10-CM | POA: Diagnosis not present

## 2017-10-07 DIAGNOSIS — N2581 Secondary hyperparathyroidism of renal origin: Secondary | ICD-10-CM | POA: Diagnosis not present

## 2017-10-09 DIAGNOSIS — D509 Iron deficiency anemia, unspecified: Secondary | ICD-10-CM | POA: Diagnosis not present

## 2017-10-09 DIAGNOSIS — N186 End stage renal disease: Secondary | ICD-10-CM | POA: Diagnosis not present

## 2017-10-09 DIAGNOSIS — D631 Anemia in chronic kidney disease: Secondary | ICD-10-CM | POA: Diagnosis not present

## 2017-10-09 DIAGNOSIS — Z992 Dependence on renal dialysis: Secondary | ICD-10-CM | POA: Diagnosis not present

## 2017-10-09 DIAGNOSIS — N2581 Secondary hyperparathyroidism of renal origin: Secondary | ICD-10-CM | POA: Diagnosis not present

## 2017-10-12 DIAGNOSIS — N186 End stage renal disease: Secondary | ICD-10-CM | POA: Diagnosis not present

## 2017-10-12 DIAGNOSIS — Z992 Dependence on renal dialysis: Secondary | ICD-10-CM | POA: Diagnosis not present

## 2017-10-12 DIAGNOSIS — N2581 Secondary hyperparathyroidism of renal origin: Secondary | ICD-10-CM | POA: Diagnosis not present

## 2017-10-12 DIAGNOSIS — D631 Anemia in chronic kidney disease: Secondary | ICD-10-CM | POA: Diagnosis not present

## 2017-10-12 DIAGNOSIS — D509 Iron deficiency anemia, unspecified: Secondary | ICD-10-CM | POA: Diagnosis not present

## 2017-10-14 DIAGNOSIS — D631 Anemia in chronic kidney disease: Secondary | ICD-10-CM | POA: Diagnosis not present

## 2017-10-14 DIAGNOSIS — Z992 Dependence on renal dialysis: Secondary | ICD-10-CM | POA: Diagnosis not present

## 2017-10-14 DIAGNOSIS — D509 Iron deficiency anemia, unspecified: Secondary | ICD-10-CM | POA: Diagnosis not present

## 2017-10-14 DIAGNOSIS — N186 End stage renal disease: Secondary | ICD-10-CM | POA: Diagnosis not present

## 2017-10-14 DIAGNOSIS — N2581 Secondary hyperparathyroidism of renal origin: Secondary | ICD-10-CM | POA: Diagnosis not present

## 2017-10-16 DIAGNOSIS — N186 End stage renal disease: Secondary | ICD-10-CM | POA: Diagnosis not present

## 2017-10-16 DIAGNOSIS — Z992 Dependence on renal dialysis: Secondary | ICD-10-CM | POA: Diagnosis not present

## 2017-10-16 DIAGNOSIS — N2581 Secondary hyperparathyroidism of renal origin: Secondary | ICD-10-CM | POA: Diagnosis not present

## 2017-10-16 DIAGNOSIS — D631 Anemia in chronic kidney disease: Secondary | ICD-10-CM | POA: Diagnosis not present

## 2017-10-16 DIAGNOSIS — D509 Iron deficiency anemia, unspecified: Secondary | ICD-10-CM | POA: Diagnosis not present

## 2017-10-19 DIAGNOSIS — N2581 Secondary hyperparathyroidism of renal origin: Secondary | ICD-10-CM | POA: Diagnosis not present

## 2017-10-19 DIAGNOSIS — Z992 Dependence on renal dialysis: Secondary | ICD-10-CM | POA: Diagnosis not present

## 2017-10-19 DIAGNOSIS — D509 Iron deficiency anemia, unspecified: Secondary | ICD-10-CM | POA: Diagnosis not present

## 2017-10-19 DIAGNOSIS — N186 End stage renal disease: Secondary | ICD-10-CM | POA: Diagnosis not present

## 2017-10-19 DIAGNOSIS — D631 Anemia in chronic kidney disease: Secondary | ICD-10-CM | POA: Diagnosis not present

## 2017-10-21 DIAGNOSIS — Z992 Dependence on renal dialysis: Secondary | ICD-10-CM | POA: Diagnosis not present

## 2017-10-21 DIAGNOSIS — N2581 Secondary hyperparathyroidism of renal origin: Secondary | ICD-10-CM | POA: Diagnosis not present

## 2017-10-21 DIAGNOSIS — N186 End stage renal disease: Secondary | ICD-10-CM | POA: Diagnosis not present

## 2017-10-21 DIAGNOSIS — D509 Iron deficiency anemia, unspecified: Secondary | ICD-10-CM | POA: Diagnosis not present

## 2017-10-21 DIAGNOSIS — D631 Anemia in chronic kidney disease: Secondary | ICD-10-CM | POA: Diagnosis not present

## 2017-10-22 DIAGNOSIS — Z992 Dependence on renal dialysis: Secondary | ICD-10-CM | POA: Diagnosis not present

## 2017-10-22 DIAGNOSIS — N186 End stage renal disease: Secondary | ICD-10-CM | POA: Diagnosis not present

## 2017-10-23 DIAGNOSIS — Z992 Dependence on renal dialysis: Secondary | ICD-10-CM | POA: Diagnosis not present

## 2017-10-23 DIAGNOSIS — D509 Iron deficiency anemia, unspecified: Secondary | ICD-10-CM | POA: Diagnosis not present

## 2017-10-23 DIAGNOSIS — D631 Anemia in chronic kidney disease: Secondary | ICD-10-CM | POA: Diagnosis not present

## 2017-10-23 DIAGNOSIS — N2581 Secondary hyperparathyroidism of renal origin: Secondary | ICD-10-CM | POA: Diagnosis not present

## 2017-10-23 DIAGNOSIS — N186 End stage renal disease: Secondary | ICD-10-CM | POA: Diagnosis not present

## 2017-10-26 DIAGNOSIS — D509 Iron deficiency anemia, unspecified: Secondary | ICD-10-CM | POA: Diagnosis not present

## 2017-10-26 DIAGNOSIS — N2581 Secondary hyperparathyroidism of renal origin: Secondary | ICD-10-CM | POA: Diagnosis not present

## 2017-10-26 DIAGNOSIS — Z992 Dependence on renal dialysis: Secondary | ICD-10-CM | POA: Diagnosis not present

## 2017-10-26 DIAGNOSIS — D631 Anemia in chronic kidney disease: Secondary | ICD-10-CM | POA: Diagnosis not present

## 2017-10-26 DIAGNOSIS — Z23 Encounter for immunization: Secondary | ICD-10-CM | POA: Diagnosis not present

## 2017-10-26 DIAGNOSIS — N186 End stage renal disease: Secondary | ICD-10-CM | POA: Diagnosis not present

## 2017-10-28 DIAGNOSIS — N2581 Secondary hyperparathyroidism of renal origin: Secondary | ICD-10-CM | POA: Diagnosis not present

## 2017-10-28 DIAGNOSIS — Z23 Encounter for immunization: Secondary | ICD-10-CM | POA: Diagnosis not present

## 2017-10-28 DIAGNOSIS — Z992 Dependence on renal dialysis: Secondary | ICD-10-CM | POA: Diagnosis not present

## 2017-10-28 DIAGNOSIS — E876 Hypokalemia: Secondary | ICD-10-CM | POA: Diagnosis not present

## 2017-10-28 DIAGNOSIS — D509 Iron deficiency anemia, unspecified: Secondary | ICD-10-CM | POA: Diagnosis not present

## 2017-10-28 DIAGNOSIS — D631 Anemia in chronic kidney disease: Secondary | ICD-10-CM | POA: Diagnosis not present

## 2017-10-28 DIAGNOSIS — N186 End stage renal disease: Secondary | ICD-10-CM | POA: Diagnosis not present

## 2017-10-30 DIAGNOSIS — Z23 Encounter for immunization: Secondary | ICD-10-CM | POA: Diagnosis not present

## 2017-10-30 DIAGNOSIS — Z992 Dependence on renal dialysis: Secondary | ICD-10-CM | POA: Diagnosis not present

## 2017-10-30 DIAGNOSIS — N186 End stage renal disease: Secondary | ICD-10-CM | POA: Diagnosis not present

## 2017-10-30 DIAGNOSIS — N2581 Secondary hyperparathyroidism of renal origin: Secondary | ICD-10-CM | POA: Diagnosis not present

## 2017-10-30 DIAGNOSIS — D509 Iron deficiency anemia, unspecified: Secondary | ICD-10-CM | POA: Diagnosis not present

## 2017-10-30 DIAGNOSIS — D631 Anemia in chronic kidney disease: Secondary | ICD-10-CM | POA: Diagnosis not present

## 2017-11-02 DIAGNOSIS — D631 Anemia in chronic kidney disease: Secondary | ICD-10-CM | POA: Diagnosis not present

## 2017-11-02 DIAGNOSIS — Z992 Dependence on renal dialysis: Secondary | ICD-10-CM | POA: Diagnosis not present

## 2017-11-02 DIAGNOSIS — D509 Iron deficiency anemia, unspecified: Secondary | ICD-10-CM | POA: Diagnosis not present

## 2017-11-02 DIAGNOSIS — N2581 Secondary hyperparathyroidism of renal origin: Secondary | ICD-10-CM | POA: Diagnosis not present

## 2017-11-02 DIAGNOSIS — Z23 Encounter for immunization: Secondary | ICD-10-CM | POA: Diagnosis not present

## 2017-11-02 DIAGNOSIS — N186 End stage renal disease: Secondary | ICD-10-CM | POA: Diagnosis not present

## 2017-11-04 DIAGNOSIS — N186 End stage renal disease: Secondary | ICD-10-CM | POA: Diagnosis not present

## 2017-11-04 DIAGNOSIS — D509 Iron deficiency anemia, unspecified: Secondary | ICD-10-CM | POA: Diagnosis not present

## 2017-11-04 DIAGNOSIS — Z23 Encounter for immunization: Secondary | ICD-10-CM | POA: Diagnosis not present

## 2017-11-04 DIAGNOSIS — D631 Anemia in chronic kidney disease: Secondary | ICD-10-CM | POA: Diagnosis not present

## 2017-11-04 DIAGNOSIS — Z992 Dependence on renal dialysis: Secondary | ICD-10-CM | POA: Diagnosis not present

## 2017-11-04 DIAGNOSIS — N2581 Secondary hyperparathyroidism of renal origin: Secondary | ICD-10-CM | POA: Diagnosis not present

## 2017-11-06 DIAGNOSIS — N186 End stage renal disease: Secondary | ICD-10-CM | POA: Diagnosis not present

## 2017-11-06 DIAGNOSIS — Z23 Encounter for immunization: Secondary | ICD-10-CM | POA: Diagnosis not present

## 2017-11-06 DIAGNOSIS — Z992 Dependence on renal dialysis: Secondary | ICD-10-CM | POA: Diagnosis not present

## 2017-11-06 DIAGNOSIS — D509 Iron deficiency anemia, unspecified: Secondary | ICD-10-CM | POA: Diagnosis not present

## 2017-11-06 DIAGNOSIS — D631 Anemia in chronic kidney disease: Secondary | ICD-10-CM | POA: Diagnosis not present

## 2017-11-06 DIAGNOSIS — N2581 Secondary hyperparathyroidism of renal origin: Secondary | ICD-10-CM | POA: Diagnosis not present

## 2017-11-09 DIAGNOSIS — N186 End stage renal disease: Secondary | ICD-10-CM | POA: Diagnosis not present

## 2017-11-09 DIAGNOSIS — D509 Iron deficiency anemia, unspecified: Secondary | ICD-10-CM | POA: Diagnosis not present

## 2017-11-09 DIAGNOSIS — D631 Anemia in chronic kidney disease: Secondary | ICD-10-CM | POA: Diagnosis not present

## 2017-11-09 DIAGNOSIS — N2581 Secondary hyperparathyroidism of renal origin: Secondary | ICD-10-CM | POA: Diagnosis not present

## 2017-11-09 DIAGNOSIS — Z992 Dependence on renal dialysis: Secondary | ICD-10-CM | POA: Diagnosis not present

## 2017-11-09 DIAGNOSIS — Z23 Encounter for immunization: Secondary | ICD-10-CM | POA: Diagnosis not present

## 2017-11-11 DIAGNOSIS — N186 End stage renal disease: Secondary | ICD-10-CM | POA: Diagnosis not present

## 2017-11-11 DIAGNOSIS — Z23 Encounter for immunization: Secondary | ICD-10-CM | POA: Diagnosis not present

## 2017-11-11 DIAGNOSIS — D631 Anemia in chronic kidney disease: Secondary | ICD-10-CM | POA: Diagnosis not present

## 2017-11-11 DIAGNOSIS — Z992 Dependence on renal dialysis: Secondary | ICD-10-CM | POA: Diagnosis not present

## 2017-11-11 DIAGNOSIS — N2581 Secondary hyperparathyroidism of renal origin: Secondary | ICD-10-CM | POA: Diagnosis not present

## 2017-11-11 DIAGNOSIS — D509 Iron deficiency anemia, unspecified: Secondary | ICD-10-CM | POA: Diagnosis not present

## 2017-11-13 DIAGNOSIS — N2581 Secondary hyperparathyroidism of renal origin: Secondary | ICD-10-CM | POA: Diagnosis not present

## 2017-11-13 DIAGNOSIS — Z992 Dependence on renal dialysis: Secondary | ICD-10-CM | POA: Diagnosis not present

## 2017-11-13 DIAGNOSIS — D509 Iron deficiency anemia, unspecified: Secondary | ICD-10-CM | POA: Diagnosis not present

## 2017-11-13 DIAGNOSIS — D631 Anemia in chronic kidney disease: Secondary | ICD-10-CM | POA: Diagnosis not present

## 2017-11-13 DIAGNOSIS — Z23 Encounter for immunization: Secondary | ICD-10-CM | POA: Diagnosis not present

## 2017-11-13 DIAGNOSIS — N186 End stage renal disease: Secondary | ICD-10-CM | POA: Diagnosis not present

## 2017-11-16 DIAGNOSIS — Z23 Encounter for immunization: Secondary | ICD-10-CM | POA: Diagnosis not present

## 2017-11-16 DIAGNOSIS — D509 Iron deficiency anemia, unspecified: Secondary | ICD-10-CM | POA: Diagnosis not present

## 2017-11-16 DIAGNOSIS — D631 Anemia in chronic kidney disease: Secondary | ICD-10-CM | POA: Diagnosis not present

## 2017-11-16 DIAGNOSIS — N2581 Secondary hyperparathyroidism of renal origin: Secondary | ICD-10-CM | POA: Diagnosis not present

## 2017-11-16 DIAGNOSIS — N186 End stage renal disease: Secondary | ICD-10-CM | POA: Diagnosis not present

## 2017-11-16 DIAGNOSIS — Z992 Dependence on renal dialysis: Secondary | ICD-10-CM | POA: Diagnosis not present

## 2017-11-18 DIAGNOSIS — Z992 Dependence on renal dialysis: Secondary | ICD-10-CM | POA: Diagnosis not present

## 2017-11-18 DIAGNOSIS — Z23 Encounter for immunization: Secondary | ICD-10-CM | POA: Diagnosis not present

## 2017-11-18 DIAGNOSIS — D509 Iron deficiency anemia, unspecified: Secondary | ICD-10-CM | POA: Diagnosis not present

## 2017-11-18 DIAGNOSIS — D631 Anemia in chronic kidney disease: Secondary | ICD-10-CM | POA: Diagnosis not present

## 2017-11-18 DIAGNOSIS — N2581 Secondary hyperparathyroidism of renal origin: Secondary | ICD-10-CM | POA: Diagnosis not present

## 2017-11-18 DIAGNOSIS — N186 End stage renal disease: Secondary | ICD-10-CM | POA: Diagnosis not present

## 2017-11-20 DIAGNOSIS — D509 Iron deficiency anemia, unspecified: Secondary | ICD-10-CM | POA: Diagnosis not present

## 2017-11-20 DIAGNOSIS — D631 Anemia in chronic kidney disease: Secondary | ICD-10-CM | POA: Diagnosis not present

## 2017-11-20 DIAGNOSIS — N186 End stage renal disease: Secondary | ICD-10-CM | POA: Diagnosis not present

## 2017-11-20 DIAGNOSIS — Z23 Encounter for immunization: Secondary | ICD-10-CM | POA: Diagnosis not present

## 2017-11-20 DIAGNOSIS — Z992 Dependence on renal dialysis: Secondary | ICD-10-CM | POA: Diagnosis not present

## 2017-11-20 DIAGNOSIS — N2581 Secondary hyperparathyroidism of renal origin: Secondary | ICD-10-CM | POA: Diagnosis not present

## 2017-11-22 DIAGNOSIS — Z992 Dependence on renal dialysis: Secondary | ICD-10-CM | POA: Diagnosis not present

## 2017-11-22 DIAGNOSIS — N186 End stage renal disease: Secondary | ICD-10-CM | POA: Diagnosis not present

## 2017-11-23 DIAGNOSIS — Z992 Dependence on renal dialysis: Secondary | ICD-10-CM | POA: Diagnosis not present

## 2017-11-23 DIAGNOSIS — D509 Iron deficiency anemia, unspecified: Secondary | ICD-10-CM | POA: Diagnosis not present

## 2017-11-23 DIAGNOSIS — N186 End stage renal disease: Secondary | ICD-10-CM | POA: Diagnosis not present

## 2017-11-23 DIAGNOSIS — N2581 Secondary hyperparathyroidism of renal origin: Secondary | ICD-10-CM | POA: Diagnosis not present

## 2017-11-23 DIAGNOSIS — D631 Anemia in chronic kidney disease: Secondary | ICD-10-CM | POA: Diagnosis not present

## 2017-11-25 DIAGNOSIS — D631 Anemia in chronic kidney disease: Secondary | ICD-10-CM | POA: Diagnosis not present

## 2017-11-25 DIAGNOSIS — N186 End stage renal disease: Secondary | ICD-10-CM | POA: Diagnosis not present

## 2017-11-25 DIAGNOSIS — N2581 Secondary hyperparathyroidism of renal origin: Secondary | ICD-10-CM | POA: Diagnosis not present

## 2017-11-25 DIAGNOSIS — D509 Iron deficiency anemia, unspecified: Secondary | ICD-10-CM | POA: Diagnosis not present

## 2017-11-25 DIAGNOSIS — Z992 Dependence on renal dialysis: Secondary | ICD-10-CM | POA: Diagnosis not present

## 2017-11-27 DIAGNOSIS — D509 Iron deficiency anemia, unspecified: Secondary | ICD-10-CM | POA: Diagnosis not present

## 2017-11-27 DIAGNOSIS — N2581 Secondary hyperparathyroidism of renal origin: Secondary | ICD-10-CM | POA: Diagnosis not present

## 2017-11-27 DIAGNOSIS — D631 Anemia in chronic kidney disease: Secondary | ICD-10-CM | POA: Diagnosis not present

## 2017-11-27 DIAGNOSIS — N186 End stage renal disease: Secondary | ICD-10-CM | POA: Diagnosis not present

## 2017-11-27 DIAGNOSIS — Z992 Dependence on renal dialysis: Secondary | ICD-10-CM | POA: Diagnosis not present

## 2017-11-30 DIAGNOSIS — N186 End stage renal disease: Secondary | ICD-10-CM | POA: Diagnosis not present

## 2017-11-30 DIAGNOSIS — D509 Iron deficiency anemia, unspecified: Secondary | ICD-10-CM | POA: Diagnosis not present

## 2017-11-30 DIAGNOSIS — Z992 Dependence on renal dialysis: Secondary | ICD-10-CM | POA: Diagnosis not present

## 2017-11-30 DIAGNOSIS — D631 Anemia in chronic kidney disease: Secondary | ICD-10-CM | POA: Diagnosis not present

## 2017-11-30 DIAGNOSIS — N2581 Secondary hyperparathyroidism of renal origin: Secondary | ICD-10-CM | POA: Diagnosis not present

## 2017-12-02 DIAGNOSIS — D509 Iron deficiency anemia, unspecified: Secondary | ICD-10-CM | POA: Diagnosis not present

## 2017-12-02 DIAGNOSIS — Z992 Dependence on renal dialysis: Secondary | ICD-10-CM | POA: Diagnosis not present

## 2017-12-02 DIAGNOSIS — N186 End stage renal disease: Secondary | ICD-10-CM | POA: Diagnosis not present

## 2017-12-02 DIAGNOSIS — D631 Anemia in chronic kidney disease: Secondary | ICD-10-CM | POA: Diagnosis not present

## 2017-12-02 DIAGNOSIS — N2581 Secondary hyperparathyroidism of renal origin: Secondary | ICD-10-CM | POA: Diagnosis not present

## 2017-12-04 DIAGNOSIS — Z992 Dependence on renal dialysis: Secondary | ICD-10-CM | POA: Diagnosis not present

## 2017-12-04 DIAGNOSIS — N2581 Secondary hyperparathyroidism of renal origin: Secondary | ICD-10-CM | POA: Diagnosis not present

## 2017-12-04 DIAGNOSIS — D631 Anemia in chronic kidney disease: Secondary | ICD-10-CM | POA: Diagnosis not present

## 2017-12-04 DIAGNOSIS — N186 End stage renal disease: Secondary | ICD-10-CM | POA: Diagnosis not present

## 2017-12-04 DIAGNOSIS — D509 Iron deficiency anemia, unspecified: Secondary | ICD-10-CM | POA: Diagnosis not present

## 2017-12-07 DIAGNOSIS — N2581 Secondary hyperparathyroidism of renal origin: Secondary | ICD-10-CM | POA: Diagnosis not present

## 2017-12-07 DIAGNOSIS — N186 End stage renal disease: Secondary | ICD-10-CM | POA: Diagnosis not present

## 2017-12-07 DIAGNOSIS — Z992 Dependence on renal dialysis: Secondary | ICD-10-CM | POA: Diagnosis not present

## 2017-12-07 DIAGNOSIS — D509 Iron deficiency anemia, unspecified: Secondary | ICD-10-CM | POA: Diagnosis not present

## 2017-12-07 DIAGNOSIS — D631 Anemia in chronic kidney disease: Secondary | ICD-10-CM | POA: Diagnosis not present

## 2017-12-09 DIAGNOSIS — N2581 Secondary hyperparathyroidism of renal origin: Secondary | ICD-10-CM | POA: Diagnosis not present

## 2017-12-09 DIAGNOSIS — D631 Anemia in chronic kidney disease: Secondary | ICD-10-CM | POA: Diagnosis not present

## 2017-12-09 DIAGNOSIS — Z992 Dependence on renal dialysis: Secondary | ICD-10-CM | POA: Diagnosis not present

## 2017-12-09 DIAGNOSIS — D509 Iron deficiency anemia, unspecified: Secondary | ICD-10-CM | POA: Diagnosis not present

## 2017-12-09 DIAGNOSIS — N186 End stage renal disease: Secondary | ICD-10-CM | POA: Diagnosis not present

## 2017-12-11 DIAGNOSIS — N2581 Secondary hyperparathyroidism of renal origin: Secondary | ICD-10-CM | POA: Diagnosis not present

## 2017-12-11 DIAGNOSIS — D509 Iron deficiency anemia, unspecified: Secondary | ICD-10-CM | POA: Diagnosis not present

## 2017-12-11 DIAGNOSIS — Z992 Dependence on renal dialysis: Secondary | ICD-10-CM | POA: Diagnosis not present

## 2017-12-11 DIAGNOSIS — N186 End stage renal disease: Secondary | ICD-10-CM | POA: Diagnosis not present

## 2017-12-11 DIAGNOSIS — D631 Anemia in chronic kidney disease: Secondary | ICD-10-CM | POA: Diagnosis not present

## 2017-12-14 DIAGNOSIS — D631 Anemia in chronic kidney disease: Secondary | ICD-10-CM | POA: Diagnosis not present

## 2017-12-14 DIAGNOSIS — N186 End stage renal disease: Secondary | ICD-10-CM | POA: Diagnosis not present

## 2017-12-14 DIAGNOSIS — D509 Iron deficiency anemia, unspecified: Secondary | ICD-10-CM | POA: Diagnosis not present

## 2017-12-14 DIAGNOSIS — N2581 Secondary hyperparathyroidism of renal origin: Secondary | ICD-10-CM | POA: Diagnosis not present

## 2017-12-14 DIAGNOSIS — Z992 Dependence on renal dialysis: Secondary | ICD-10-CM | POA: Diagnosis not present

## 2017-12-16 DIAGNOSIS — D509 Iron deficiency anemia, unspecified: Secondary | ICD-10-CM | POA: Diagnosis not present

## 2017-12-16 DIAGNOSIS — N2581 Secondary hyperparathyroidism of renal origin: Secondary | ICD-10-CM | POA: Diagnosis not present

## 2017-12-16 DIAGNOSIS — D631 Anemia in chronic kidney disease: Secondary | ICD-10-CM | POA: Diagnosis not present

## 2017-12-16 DIAGNOSIS — N186 End stage renal disease: Secondary | ICD-10-CM | POA: Diagnosis not present

## 2017-12-16 DIAGNOSIS — Z992 Dependence on renal dialysis: Secondary | ICD-10-CM | POA: Diagnosis not present

## 2017-12-18 DIAGNOSIS — D509 Iron deficiency anemia, unspecified: Secondary | ICD-10-CM | POA: Diagnosis not present

## 2017-12-18 DIAGNOSIS — N2581 Secondary hyperparathyroidism of renal origin: Secondary | ICD-10-CM | POA: Diagnosis not present

## 2017-12-18 DIAGNOSIS — D631 Anemia in chronic kidney disease: Secondary | ICD-10-CM | POA: Diagnosis not present

## 2017-12-18 DIAGNOSIS — Z992 Dependence on renal dialysis: Secondary | ICD-10-CM | POA: Diagnosis not present

## 2017-12-18 DIAGNOSIS — N186 End stage renal disease: Secondary | ICD-10-CM | POA: Diagnosis not present

## 2017-12-19 IMAGING — CR DG CHEST 1V PORT
1 series · 1 of 1 positions shown · non-contrast
Comparison: 09/10/2015.

CLINICAL DATA: 75-year-old female status post tunneled dialysis
catheter placement. Initial encounter.

EXAM:
PORTABLE CHEST 1 VIEW

[ap portable]
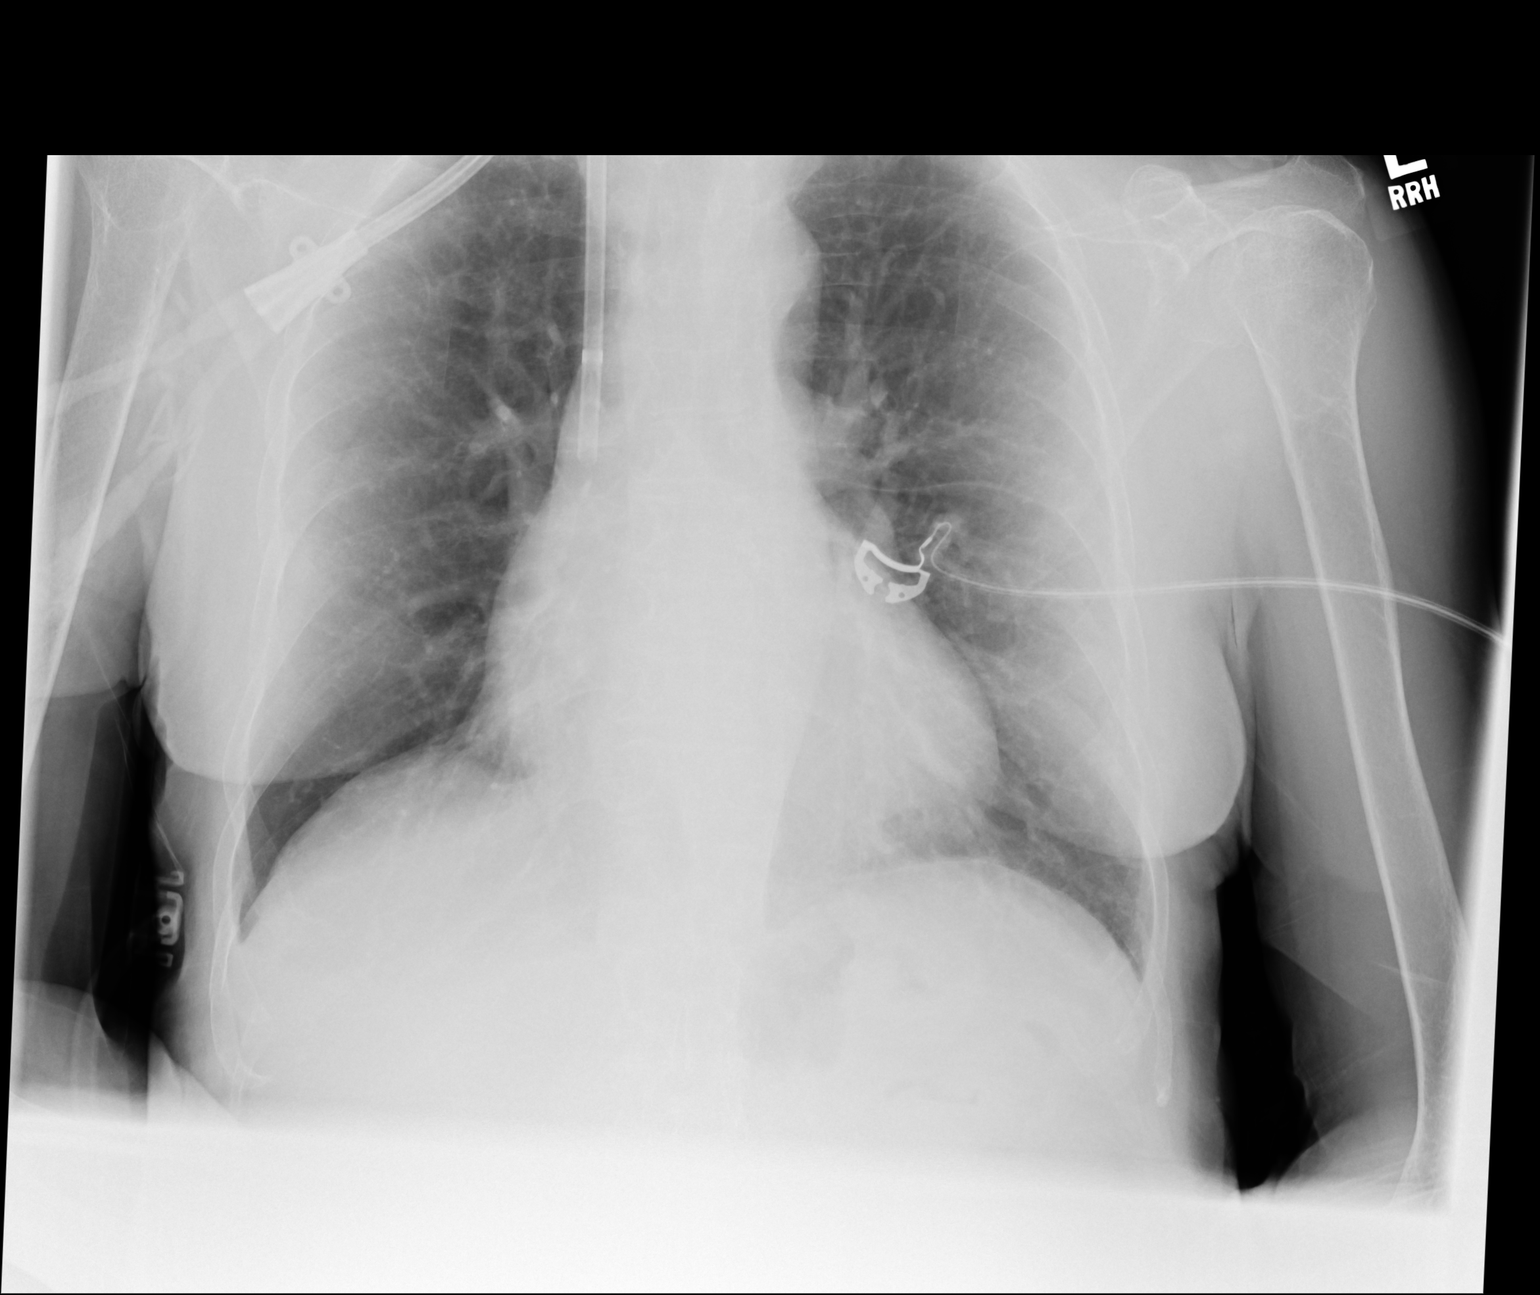

[1 of 1 positions shown; findings below may reference images not displayed]

FINDINGS: Portable AP upright view at 8848 hours. Right chest IJ approach dual
lumen dialysis type catheter now in place. The entire catheter is
not included on this image (the portion above the clavicle at the
venous puncture site). The catheter tip projects at the lower SVC
level just below the carina.

No pneumothorax. Mildly lower lung volumes. Stable cardiac size and
mediastinal contours. No acute pulmonary opacity.
IMPRESSION: No pneumothorax or acute pulmonary findings following right IJ
approach dialysis catheter placement. The superior most course of
the catheter is not included, but otherwise no adverse features are
identified.

## 2017-12-21 DIAGNOSIS — N2581 Secondary hyperparathyroidism of renal origin: Secondary | ICD-10-CM | POA: Diagnosis not present

## 2017-12-21 DIAGNOSIS — Z992 Dependence on renal dialysis: Secondary | ICD-10-CM | POA: Diagnosis not present

## 2017-12-21 DIAGNOSIS — D509 Iron deficiency anemia, unspecified: Secondary | ICD-10-CM | POA: Diagnosis not present

## 2017-12-21 DIAGNOSIS — N186 End stage renal disease: Secondary | ICD-10-CM | POA: Diagnosis not present

## 2017-12-21 DIAGNOSIS — D631 Anemia in chronic kidney disease: Secondary | ICD-10-CM | POA: Diagnosis not present

## 2017-12-22 DIAGNOSIS — I5032 Chronic diastolic (congestive) heart failure: Secondary | ICD-10-CM | POA: Diagnosis not present

## 2017-12-22 DIAGNOSIS — I10 Essential (primary) hypertension: Secondary | ICD-10-CM | POA: Diagnosis not present

## 2017-12-22 DIAGNOSIS — E7849 Other hyperlipidemia: Secondary | ICD-10-CM | POA: Diagnosis not present

## 2017-12-22 DIAGNOSIS — F3289 Other specified depressive episodes: Secondary | ICD-10-CM | POA: Diagnosis not present

## 2017-12-23 DIAGNOSIS — N186 End stage renal disease: Secondary | ICD-10-CM | POA: Diagnosis not present

## 2017-12-23 DIAGNOSIS — D631 Anemia in chronic kidney disease: Secondary | ICD-10-CM | POA: Diagnosis not present

## 2017-12-23 DIAGNOSIS — D509 Iron deficiency anemia, unspecified: Secondary | ICD-10-CM | POA: Diagnosis not present

## 2017-12-23 DIAGNOSIS — Z992 Dependence on renal dialysis: Secondary | ICD-10-CM | POA: Diagnosis not present

## 2017-12-23 DIAGNOSIS — N2581 Secondary hyperparathyroidism of renal origin: Secondary | ICD-10-CM | POA: Diagnosis not present

## 2017-12-25 DIAGNOSIS — N2581 Secondary hyperparathyroidism of renal origin: Secondary | ICD-10-CM | POA: Diagnosis not present

## 2017-12-25 DIAGNOSIS — Z992 Dependence on renal dialysis: Secondary | ICD-10-CM | POA: Diagnosis not present

## 2017-12-25 DIAGNOSIS — D631 Anemia in chronic kidney disease: Secondary | ICD-10-CM | POA: Diagnosis not present

## 2017-12-25 DIAGNOSIS — D509 Iron deficiency anemia, unspecified: Secondary | ICD-10-CM | POA: Diagnosis not present

## 2017-12-25 DIAGNOSIS — N186 End stage renal disease: Secondary | ICD-10-CM | POA: Diagnosis not present

## 2017-12-28 DIAGNOSIS — D631 Anemia in chronic kidney disease: Secondary | ICD-10-CM | POA: Diagnosis not present

## 2017-12-28 DIAGNOSIS — N2581 Secondary hyperparathyroidism of renal origin: Secondary | ICD-10-CM | POA: Diagnosis not present

## 2017-12-28 DIAGNOSIS — N186 End stage renal disease: Secondary | ICD-10-CM | POA: Diagnosis not present

## 2017-12-28 DIAGNOSIS — Z992 Dependence on renal dialysis: Secondary | ICD-10-CM | POA: Diagnosis not present

## 2017-12-28 DIAGNOSIS — D509 Iron deficiency anemia, unspecified: Secondary | ICD-10-CM | POA: Diagnosis not present

## 2017-12-30 DIAGNOSIS — Z992 Dependence on renal dialysis: Secondary | ICD-10-CM | POA: Diagnosis not present

## 2017-12-30 DIAGNOSIS — N2581 Secondary hyperparathyroidism of renal origin: Secondary | ICD-10-CM | POA: Diagnosis not present

## 2017-12-30 DIAGNOSIS — N186 End stage renal disease: Secondary | ICD-10-CM | POA: Diagnosis not present

## 2017-12-30 DIAGNOSIS — D631 Anemia in chronic kidney disease: Secondary | ICD-10-CM | POA: Diagnosis not present

## 2017-12-30 DIAGNOSIS — D509 Iron deficiency anemia, unspecified: Secondary | ICD-10-CM | POA: Diagnosis not present

## 2018-01-01 DIAGNOSIS — Z992 Dependence on renal dialysis: Secondary | ICD-10-CM | POA: Diagnosis not present

## 2018-01-01 DIAGNOSIS — D509 Iron deficiency anemia, unspecified: Secondary | ICD-10-CM | POA: Diagnosis not present

## 2018-01-01 DIAGNOSIS — D631 Anemia in chronic kidney disease: Secondary | ICD-10-CM | POA: Diagnosis not present

## 2018-01-01 DIAGNOSIS — N2581 Secondary hyperparathyroidism of renal origin: Secondary | ICD-10-CM | POA: Diagnosis not present

## 2018-01-01 DIAGNOSIS — N186 End stage renal disease: Secondary | ICD-10-CM | POA: Diagnosis not present

## 2018-01-04 DIAGNOSIS — Z992 Dependence on renal dialysis: Secondary | ICD-10-CM | POA: Diagnosis not present

## 2018-01-04 DIAGNOSIS — D631 Anemia in chronic kidney disease: Secondary | ICD-10-CM | POA: Diagnosis not present

## 2018-01-04 DIAGNOSIS — D509 Iron deficiency anemia, unspecified: Secondary | ICD-10-CM | POA: Diagnosis not present

## 2018-01-04 DIAGNOSIS — N2581 Secondary hyperparathyroidism of renal origin: Secondary | ICD-10-CM | POA: Diagnosis not present

## 2018-01-04 DIAGNOSIS — N186 End stage renal disease: Secondary | ICD-10-CM | POA: Diagnosis not present

## 2018-01-05 DIAGNOSIS — Z86010 Personal history of colonic polyps: Secondary | ICD-10-CM | POA: Diagnosis not present

## 2018-01-05 DIAGNOSIS — Z538 Procedure and treatment not carried out for other reasons: Secondary | ICD-10-CM | POA: Diagnosis not present

## 2018-01-05 DIAGNOSIS — Z1211 Encounter for screening for malignant neoplasm of colon: Secondary | ICD-10-CM | POA: Diagnosis not present

## 2018-01-06 DIAGNOSIS — N186 End stage renal disease: Secondary | ICD-10-CM | POA: Diagnosis not present

## 2018-01-06 DIAGNOSIS — D509 Iron deficiency anemia, unspecified: Secondary | ICD-10-CM | POA: Diagnosis not present

## 2018-01-06 DIAGNOSIS — Z992 Dependence on renal dialysis: Secondary | ICD-10-CM | POA: Diagnosis not present

## 2018-01-06 DIAGNOSIS — N2581 Secondary hyperparathyroidism of renal origin: Secondary | ICD-10-CM | POA: Diagnosis not present

## 2018-01-06 DIAGNOSIS — D631 Anemia in chronic kidney disease: Secondary | ICD-10-CM | POA: Diagnosis not present

## 2018-01-08 DIAGNOSIS — Z992 Dependence on renal dialysis: Secondary | ICD-10-CM | POA: Diagnosis not present

## 2018-01-08 DIAGNOSIS — D509 Iron deficiency anemia, unspecified: Secondary | ICD-10-CM | POA: Diagnosis not present

## 2018-01-08 DIAGNOSIS — N186 End stage renal disease: Secondary | ICD-10-CM | POA: Diagnosis not present

## 2018-01-08 DIAGNOSIS — N2581 Secondary hyperparathyroidism of renal origin: Secondary | ICD-10-CM | POA: Diagnosis not present

## 2018-01-08 DIAGNOSIS — D631 Anemia in chronic kidney disease: Secondary | ICD-10-CM | POA: Diagnosis not present

## 2018-01-11 DIAGNOSIS — Z992 Dependence on renal dialysis: Secondary | ICD-10-CM | POA: Diagnosis not present

## 2018-01-11 DIAGNOSIS — D631 Anemia in chronic kidney disease: Secondary | ICD-10-CM | POA: Diagnosis not present

## 2018-01-11 DIAGNOSIS — N2581 Secondary hyperparathyroidism of renal origin: Secondary | ICD-10-CM | POA: Diagnosis not present

## 2018-01-11 DIAGNOSIS — N186 End stage renal disease: Secondary | ICD-10-CM | POA: Diagnosis not present

## 2018-01-11 DIAGNOSIS — D509 Iron deficiency anemia, unspecified: Secondary | ICD-10-CM | POA: Diagnosis not present

## 2018-01-13 DIAGNOSIS — Z992 Dependence on renal dialysis: Secondary | ICD-10-CM | POA: Diagnosis not present

## 2018-01-13 DIAGNOSIS — N186 End stage renal disease: Secondary | ICD-10-CM | POA: Diagnosis not present

## 2018-01-13 DIAGNOSIS — D509 Iron deficiency anemia, unspecified: Secondary | ICD-10-CM | POA: Diagnosis not present

## 2018-01-13 DIAGNOSIS — D631 Anemia in chronic kidney disease: Secondary | ICD-10-CM | POA: Diagnosis not present

## 2018-01-13 DIAGNOSIS — N2581 Secondary hyperparathyroidism of renal origin: Secondary | ICD-10-CM | POA: Diagnosis not present

## 2018-01-15 DIAGNOSIS — D509 Iron deficiency anemia, unspecified: Secondary | ICD-10-CM | POA: Diagnosis not present

## 2018-01-15 DIAGNOSIS — N186 End stage renal disease: Secondary | ICD-10-CM | POA: Diagnosis not present

## 2018-01-15 DIAGNOSIS — N2581 Secondary hyperparathyroidism of renal origin: Secondary | ICD-10-CM | POA: Diagnosis not present

## 2018-01-15 DIAGNOSIS — D631 Anemia in chronic kidney disease: Secondary | ICD-10-CM | POA: Diagnosis not present

## 2018-01-15 DIAGNOSIS — Z992 Dependence on renal dialysis: Secondary | ICD-10-CM | POA: Diagnosis not present

## 2018-01-18 DIAGNOSIS — N186 End stage renal disease: Secondary | ICD-10-CM | POA: Diagnosis not present

## 2018-01-18 DIAGNOSIS — D509 Iron deficiency anemia, unspecified: Secondary | ICD-10-CM | POA: Diagnosis not present

## 2018-01-18 DIAGNOSIS — D631 Anemia in chronic kidney disease: Secondary | ICD-10-CM | POA: Diagnosis not present

## 2018-01-18 DIAGNOSIS — Z992 Dependence on renal dialysis: Secondary | ICD-10-CM | POA: Diagnosis not present

## 2018-01-18 DIAGNOSIS — N2581 Secondary hyperparathyroidism of renal origin: Secondary | ICD-10-CM | POA: Diagnosis not present

## 2018-01-20 DIAGNOSIS — N2581 Secondary hyperparathyroidism of renal origin: Secondary | ICD-10-CM | POA: Diagnosis not present

## 2018-01-20 DIAGNOSIS — Z992 Dependence on renal dialysis: Secondary | ICD-10-CM | POA: Diagnosis not present

## 2018-01-20 DIAGNOSIS — N186 End stage renal disease: Secondary | ICD-10-CM | POA: Diagnosis not present

## 2018-01-20 DIAGNOSIS — D631 Anemia in chronic kidney disease: Secondary | ICD-10-CM | POA: Diagnosis not present

## 2018-01-20 DIAGNOSIS — D509 Iron deficiency anemia, unspecified: Secondary | ICD-10-CM | POA: Diagnosis not present

## 2018-01-22 DIAGNOSIS — D631 Anemia in chronic kidney disease: Secondary | ICD-10-CM | POA: Diagnosis not present

## 2018-01-22 DIAGNOSIS — N186 End stage renal disease: Secondary | ICD-10-CM | POA: Diagnosis not present

## 2018-01-22 DIAGNOSIS — N2581 Secondary hyperparathyroidism of renal origin: Secondary | ICD-10-CM | POA: Diagnosis not present

## 2018-01-22 DIAGNOSIS — D509 Iron deficiency anemia, unspecified: Secondary | ICD-10-CM | POA: Diagnosis not present

## 2018-01-22 DIAGNOSIS — Z992 Dependence on renal dialysis: Secondary | ICD-10-CM | POA: Diagnosis not present

## 2018-01-25 DIAGNOSIS — Z23 Encounter for immunization: Secondary | ICD-10-CM | POA: Diagnosis not present

## 2018-01-25 DIAGNOSIS — N186 End stage renal disease: Secondary | ICD-10-CM | POA: Diagnosis not present

## 2018-01-25 DIAGNOSIS — D631 Anemia in chronic kidney disease: Secondary | ICD-10-CM | POA: Diagnosis not present

## 2018-01-25 DIAGNOSIS — Z992 Dependence on renal dialysis: Secondary | ICD-10-CM | POA: Diagnosis not present

## 2018-01-25 DIAGNOSIS — D509 Iron deficiency anemia, unspecified: Secondary | ICD-10-CM | POA: Diagnosis not present

## 2018-01-25 DIAGNOSIS — N2581 Secondary hyperparathyroidism of renal origin: Secondary | ICD-10-CM | POA: Diagnosis not present

## 2018-01-27 DIAGNOSIS — Z992 Dependence on renal dialysis: Secondary | ICD-10-CM | POA: Diagnosis not present

## 2018-01-27 DIAGNOSIS — D631 Anemia in chronic kidney disease: Secondary | ICD-10-CM | POA: Diagnosis not present

## 2018-01-27 DIAGNOSIS — Z23 Encounter for immunization: Secondary | ICD-10-CM | POA: Diagnosis not present

## 2018-01-27 DIAGNOSIS — N186 End stage renal disease: Secondary | ICD-10-CM | POA: Diagnosis not present

## 2018-01-27 DIAGNOSIS — D509 Iron deficiency anemia, unspecified: Secondary | ICD-10-CM | POA: Diagnosis not present

## 2018-01-27 DIAGNOSIS — N2581 Secondary hyperparathyroidism of renal origin: Secondary | ICD-10-CM | POA: Diagnosis not present

## 2018-01-29 DIAGNOSIS — Z23 Encounter for immunization: Secondary | ICD-10-CM | POA: Diagnosis not present

## 2018-01-29 DIAGNOSIS — N2581 Secondary hyperparathyroidism of renal origin: Secondary | ICD-10-CM | POA: Diagnosis not present

## 2018-01-29 DIAGNOSIS — N186 End stage renal disease: Secondary | ICD-10-CM | POA: Diagnosis not present

## 2018-01-29 DIAGNOSIS — D509 Iron deficiency anemia, unspecified: Secondary | ICD-10-CM | POA: Diagnosis not present

## 2018-01-29 DIAGNOSIS — D631 Anemia in chronic kidney disease: Secondary | ICD-10-CM | POA: Diagnosis not present

## 2018-01-29 DIAGNOSIS — Z992 Dependence on renal dialysis: Secondary | ICD-10-CM | POA: Diagnosis not present

## 2018-02-01 DIAGNOSIS — N186 End stage renal disease: Secondary | ICD-10-CM | POA: Diagnosis not present

## 2018-02-01 DIAGNOSIS — N2581 Secondary hyperparathyroidism of renal origin: Secondary | ICD-10-CM | POA: Diagnosis not present

## 2018-02-01 DIAGNOSIS — D509 Iron deficiency anemia, unspecified: Secondary | ICD-10-CM | POA: Diagnosis not present

## 2018-02-01 DIAGNOSIS — Z23 Encounter for immunization: Secondary | ICD-10-CM | POA: Diagnosis not present

## 2018-02-01 DIAGNOSIS — Z992 Dependence on renal dialysis: Secondary | ICD-10-CM | POA: Diagnosis not present

## 2018-02-01 DIAGNOSIS — D631 Anemia in chronic kidney disease: Secondary | ICD-10-CM | POA: Diagnosis not present

## 2018-02-03 DIAGNOSIS — N2581 Secondary hyperparathyroidism of renal origin: Secondary | ICD-10-CM | POA: Diagnosis not present

## 2018-02-03 DIAGNOSIS — Z992 Dependence on renal dialysis: Secondary | ICD-10-CM | POA: Diagnosis not present

## 2018-02-03 DIAGNOSIS — D631 Anemia in chronic kidney disease: Secondary | ICD-10-CM | POA: Diagnosis not present

## 2018-02-03 DIAGNOSIS — N186 End stage renal disease: Secondary | ICD-10-CM | POA: Diagnosis not present

## 2018-02-03 DIAGNOSIS — D509 Iron deficiency anemia, unspecified: Secondary | ICD-10-CM | POA: Diagnosis not present

## 2018-02-03 DIAGNOSIS — Z23 Encounter for immunization: Secondary | ICD-10-CM | POA: Diagnosis not present

## 2018-02-05 DIAGNOSIS — Z23 Encounter for immunization: Secondary | ICD-10-CM | POA: Diagnosis not present

## 2018-02-05 DIAGNOSIS — N2581 Secondary hyperparathyroidism of renal origin: Secondary | ICD-10-CM | POA: Diagnosis not present

## 2018-02-05 DIAGNOSIS — N186 End stage renal disease: Secondary | ICD-10-CM | POA: Diagnosis not present

## 2018-02-05 DIAGNOSIS — D509 Iron deficiency anemia, unspecified: Secondary | ICD-10-CM | POA: Diagnosis not present

## 2018-02-05 DIAGNOSIS — D631 Anemia in chronic kidney disease: Secondary | ICD-10-CM | POA: Diagnosis not present

## 2018-02-05 DIAGNOSIS — Z992 Dependence on renal dialysis: Secondary | ICD-10-CM | POA: Diagnosis not present

## 2018-02-08 DIAGNOSIS — N186 End stage renal disease: Secondary | ICD-10-CM | POA: Diagnosis not present

## 2018-02-08 DIAGNOSIS — D631 Anemia in chronic kidney disease: Secondary | ICD-10-CM | POA: Diagnosis not present

## 2018-02-08 DIAGNOSIS — Z23 Encounter for immunization: Secondary | ICD-10-CM | POA: Diagnosis not present

## 2018-02-08 DIAGNOSIS — Z992 Dependence on renal dialysis: Secondary | ICD-10-CM | POA: Diagnosis not present

## 2018-02-08 DIAGNOSIS — D509 Iron deficiency anemia, unspecified: Secondary | ICD-10-CM | POA: Diagnosis not present

## 2018-02-08 DIAGNOSIS — N2581 Secondary hyperparathyroidism of renal origin: Secondary | ICD-10-CM | POA: Diagnosis not present

## 2018-02-10 DIAGNOSIS — N2581 Secondary hyperparathyroidism of renal origin: Secondary | ICD-10-CM | POA: Diagnosis not present

## 2018-02-10 DIAGNOSIS — D509 Iron deficiency anemia, unspecified: Secondary | ICD-10-CM | POA: Diagnosis not present

## 2018-02-10 DIAGNOSIS — D631 Anemia in chronic kidney disease: Secondary | ICD-10-CM | POA: Diagnosis not present

## 2018-02-10 DIAGNOSIS — N186 End stage renal disease: Secondary | ICD-10-CM | POA: Diagnosis not present

## 2018-02-10 DIAGNOSIS — Z23 Encounter for immunization: Secondary | ICD-10-CM | POA: Diagnosis not present

## 2018-02-10 DIAGNOSIS — Z992 Dependence on renal dialysis: Secondary | ICD-10-CM | POA: Diagnosis not present

## 2018-02-12 DIAGNOSIS — N186 End stage renal disease: Secondary | ICD-10-CM | POA: Diagnosis not present

## 2018-02-12 DIAGNOSIS — N2581 Secondary hyperparathyroidism of renal origin: Secondary | ICD-10-CM | POA: Diagnosis not present

## 2018-02-12 DIAGNOSIS — Z992 Dependence on renal dialysis: Secondary | ICD-10-CM | POA: Diagnosis not present

## 2018-02-12 DIAGNOSIS — D631 Anemia in chronic kidney disease: Secondary | ICD-10-CM | POA: Diagnosis not present

## 2018-02-12 DIAGNOSIS — Z23 Encounter for immunization: Secondary | ICD-10-CM | POA: Diagnosis not present

## 2018-02-12 DIAGNOSIS — D509 Iron deficiency anemia, unspecified: Secondary | ICD-10-CM | POA: Diagnosis not present

## 2018-02-14 DIAGNOSIS — Z23 Encounter for immunization: Secondary | ICD-10-CM | POA: Diagnosis not present

## 2018-02-14 DIAGNOSIS — N2581 Secondary hyperparathyroidism of renal origin: Secondary | ICD-10-CM | POA: Diagnosis not present

## 2018-02-14 DIAGNOSIS — N186 End stage renal disease: Secondary | ICD-10-CM | POA: Diagnosis not present

## 2018-02-14 DIAGNOSIS — Z992 Dependence on renal dialysis: Secondary | ICD-10-CM | POA: Diagnosis not present

## 2018-02-14 DIAGNOSIS — D509 Iron deficiency anemia, unspecified: Secondary | ICD-10-CM | POA: Diagnosis not present

## 2018-02-14 DIAGNOSIS — D631 Anemia in chronic kidney disease: Secondary | ICD-10-CM | POA: Diagnosis not present

## 2018-02-17 DIAGNOSIS — D509 Iron deficiency anemia, unspecified: Secondary | ICD-10-CM | POA: Diagnosis not present

## 2018-02-17 DIAGNOSIS — E119 Type 2 diabetes mellitus without complications: Secondary | ICD-10-CM | POA: Diagnosis not present

## 2018-02-17 DIAGNOSIS — Z23 Encounter for immunization: Secondary | ICD-10-CM | POA: Diagnosis not present

## 2018-02-17 DIAGNOSIS — N2581 Secondary hyperparathyroidism of renal origin: Secondary | ICD-10-CM | POA: Diagnosis not present

## 2018-02-17 DIAGNOSIS — N186 End stage renal disease: Secondary | ICD-10-CM | POA: Diagnosis not present

## 2018-02-17 DIAGNOSIS — D631 Anemia in chronic kidney disease: Secondary | ICD-10-CM | POA: Diagnosis not present

## 2018-02-17 DIAGNOSIS — Z992 Dependence on renal dialysis: Secondary | ICD-10-CM | POA: Diagnosis not present

## 2018-02-19 DIAGNOSIS — Z23 Encounter for immunization: Secondary | ICD-10-CM | POA: Diagnosis not present

## 2018-02-19 DIAGNOSIS — N186 End stage renal disease: Secondary | ICD-10-CM | POA: Diagnosis not present

## 2018-02-19 DIAGNOSIS — N2581 Secondary hyperparathyroidism of renal origin: Secondary | ICD-10-CM | POA: Diagnosis not present

## 2018-02-19 DIAGNOSIS — D509 Iron deficiency anemia, unspecified: Secondary | ICD-10-CM | POA: Diagnosis not present

## 2018-02-19 DIAGNOSIS — D631 Anemia in chronic kidney disease: Secondary | ICD-10-CM | POA: Diagnosis not present

## 2018-02-19 DIAGNOSIS — Z992 Dependence on renal dialysis: Secondary | ICD-10-CM | POA: Diagnosis not present

## 2018-02-22 DIAGNOSIS — Z23 Encounter for immunization: Secondary | ICD-10-CM | POA: Diagnosis not present

## 2018-02-22 DIAGNOSIS — N186 End stage renal disease: Secondary | ICD-10-CM | POA: Diagnosis not present

## 2018-02-22 DIAGNOSIS — D509 Iron deficiency anemia, unspecified: Secondary | ICD-10-CM | POA: Diagnosis not present

## 2018-02-22 DIAGNOSIS — N2581 Secondary hyperparathyroidism of renal origin: Secondary | ICD-10-CM | POA: Diagnosis not present

## 2018-02-22 DIAGNOSIS — Z992 Dependence on renal dialysis: Secondary | ICD-10-CM | POA: Diagnosis not present

## 2018-02-22 DIAGNOSIS — D631 Anemia in chronic kidney disease: Secondary | ICD-10-CM | POA: Diagnosis not present

## 2018-02-24 DIAGNOSIS — N2581 Secondary hyperparathyroidism of renal origin: Secondary | ICD-10-CM | POA: Diagnosis not present

## 2018-02-24 DIAGNOSIS — Z992 Dependence on renal dialysis: Secondary | ICD-10-CM | POA: Diagnosis not present

## 2018-02-24 DIAGNOSIS — D631 Anemia in chronic kidney disease: Secondary | ICD-10-CM | POA: Diagnosis not present

## 2018-02-24 DIAGNOSIS — D509 Iron deficiency anemia, unspecified: Secondary | ICD-10-CM | POA: Diagnosis not present

## 2018-02-24 DIAGNOSIS — N186 End stage renal disease: Secondary | ICD-10-CM | POA: Diagnosis not present

## 2018-02-26 DIAGNOSIS — N2581 Secondary hyperparathyroidism of renal origin: Secondary | ICD-10-CM | POA: Diagnosis not present

## 2018-02-26 DIAGNOSIS — D509 Iron deficiency anemia, unspecified: Secondary | ICD-10-CM | POA: Diagnosis not present

## 2018-02-26 DIAGNOSIS — N186 End stage renal disease: Secondary | ICD-10-CM | POA: Diagnosis not present

## 2018-02-26 DIAGNOSIS — Z992 Dependence on renal dialysis: Secondary | ICD-10-CM | POA: Diagnosis not present

## 2018-02-26 DIAGNOSIS — D631 Anemia in chronic kidney disease: Secondary | ICD-10-CM | POA: Diagnosis not present

## 2018-03-02 DIAGNOSIS — N2581 Secondary hyperparathyroidism of renal origin: Secondary | ICD-10-CM | POA: Diagnosis not present

## 2018-03-02 DIAGNOSIS — D509 Iron deficiency anemia, unspecified: Secondary | ICD-10-CM | POA: Diagnosis not present

## 2018-03-02 DIAGNOSIS — D631 Anemia in chronic kidney disease: Secondary | ICD-10-CM | POA: Diagnosis not present

## 2018-03-02 DIAGNOSIS — N186 End stage renal disease: Secondary | ICD-10-CM | POA: Diagnosis not present

## 2018-03-02 DIAGNOSIS — Z992 Dependence on renal dialysis: Secondary | ICD-10-CM | POA: Diagnosis not present

## 2018-03-03 DIAGNOSIS — N186 End stage renal disease: Secondary | ICD-10-CM | POA: Diagnosis not present

## 2018-03-03 DIAGNOSIS — D509 Iron deficiency anemia, unspecified: Secondary | ICD-10-CM | POA: Diagnosis not present

## 2018-03-03 DIAGNOSIS — Z992 Dependence on renal dialysis: Secondary | ICD-10-CM | POA: Diagnosis not present

## 2018-03-03 DIAGNOSIS — D631 Anemia in chronic kidney disease: Secondary | ICD-10-CM | POA: Diagnosis not present

## 2018-03-03 DIAGNOSIS — N2581 Secondary hyperparathyroidism of renal origin: Secondary | ICD-10-CM | POA: Diagnosis not present

## 2018-03-05 DIAGNOSIS — N2581 Secondary hyperparathyroidism of renal origin: Secondary | ICD-10-CM | POA: Diagnosis not present

## 2018-03-05 DIAGNOSIS — Z992 Dependence on renal dialysis: Secondary | ICD-10-CM | POA: Diagnosis not present

## 2018-03-05 DIAGNOSIS — D631 Anemia in chronic kidney disease: Secondary | ICD-10-CM | POA: Diagnosis not present

## 2018-03-05 DIAGNOSIS — D509 Iron deficiency anemia, unspecified: Secondary | ICD-10-CM | POA: Diagnosis not present

## 2018-03-05 DIAGNOSIS — N186 End stage renal disease: Secondary | ICD-10-CM | POA: Diagnosis not present

## 2018-03-08 DIAGNOSIS — D509 Iron deficiency anemia, unspecified: Secondary | ICD-10-CM | POA: Diagnosis not present

## 2018-03-08 DIAGNOSIS — D631 Anemia in chronic kidney disease: Secondary | ICD-10-CM | POA: Diagnosis not present

## 2018-03-08 DIAGNOSIS — Z992 Dependence on renal dialysis: Secondary | ICD-10-CM | POA: Diagnosis not present

## 2018-03-08 DIAGNOSIS — N186 End stage renal disease: Secondary | ICD-10-CM | POA: Diagnosis not present

## 2018-03-08 DIAGNOSIS — N2581 Secondary hyperparathyroidism of renal origin: Secondary | ICD-10-CM | POA: Diagnosis not present

## 2018-03-10 DIAGNOSIS — D509 Iron deficiency anemia, unspecified: Secondary | ICD-10-CM | POA: Diagnosis not present

## 2018-03-10 DIAGNOSIS — Z992 Dependence on renal dialysis: Secondary | ICD-10-CM | POA: Diagnosis not present

## 2018-03-10 DIAGNOSIS — N186 End stage renal disease: Secondary | ICD-10-CM | POA: Diagnosis not present

## 2018-03-10 DIAGNOSIS — N2581 Secondary hyperparathyroidism of renal origin: Secondary | ICD-10-CM | POA: Diagnosis not present

## 2018-03-10 DIAGNOSIS — D631 Anemia in chronic kidney disease: Secondary | ICD-10-CM | POA: Diagnosis not present

## 2018-03-12 DIAGNOSIS — N186 End stage renal disease: Secondary | ICD-10-CM | POA: Diagnosis not present

## 2018-03-12 DIAGNOSIS — D631 Anemia in chronic kidney disease: Secondary | ICD-10-CM | POA: Diagnosis not present

## 2018-03-12 DIAGNOSIS — D509 Iron deficiency anemia, unspecified: Secondary | ICD-10-CM | POA: Diagnosis not present

## 2018-03-12 DIAGNOSIS — Z992 Dependence on renal dialysis: Secondary | ICD-10-CM | POA: Diagnosis not present

## 2018-03-12 DIAGNOSIS — N2581 Secondary hyperparathyroidism of renal origin: Secondary | ICD-10-CM | POA: Diagnosis not present

## 2018-03-15 DIAGNOSIS — N186 End stage renal disease: Secondary | ICD-10-CM | POA: Diagnosis not present

## 2018-03-15 DIAGNOSIS — D509 Iron deficiency anemia, unspecified: Secondary | ICD-10-CM | POA: Diagnosis not present

## 2018-03-15 DIAGNOSIS — N2581 Secondary hyperparathyroidism of renal origin: Secondary | ICD-10-CM | POA: Diagnosis not present

## 2018-03-15 DIAGNOSIS — Z992 Dependence on renal dialysis: Secondary | ICD-10-CM | POA: Diagnosis not present

## 2018-03-15 DIAGNOSIS — D631 Anemia in chronic kidney disease: Secondary | ICD-10-CM | POA: Diagnosis not present

## 2018-03-17 DIAGNOSIS — Z992 Dependence on renal dialysis: Secondary | ICD-10-CM | POA: Diagnosis not present

## 2018-03-17 DIAGNOSIS — N2581 Secondary hyperparathyroidism of renal origin: Secondary | ICD-10-CM | POA: Diagnosis not present

## 2018-03-17 DIAGNOSIS — N186 End stage renal disease: Secondary | ICD-10-CM | POA: Diagnosis not present

## 2018-03-17 DIAGNOSIS — D631 Anemia in chronic kidney disease: Secondary | ICD-10-CM | POA: Diagnosis not present

## 2018-03-17 DIAGNOSIS — D509 Iron deficiency anemia, unspecified: Secondary | ICD-10-CM | POA: Diagnosis not present

## 2018-03-19 DIAGNOSIS — N2581 Secondary hyperparathyroidism of renal origin: Secondary | ICD-10-CM | POA: Diagnosis not present

## 2018-03-19 DIAGNOSIS — N186 End stage renal disease: Secondary | ICD-10-CM | POA: Diagnosis not present

## 2018-03-19 DIAGNOSIS — D509 Iron deficiency anemia, unspecified: Secondary | ICD-10-CM | POA: Diagnosis not present

## 2018-03-19 DIAGNOSIS — D631 Anemia in chronic kidney disease: Secondary | ICD-10-CM | POA: Diagnosis not present

## 2018-03-19 DIAGNOSIS — Z992 Dependence on renal dialysis: Secondary | ICD-10-CM | POA: Diagnosis not present

## 2018-03-22 DIAGNOSIS — D631 Anemia in chronic kidney disease: Secondary | ICD-10-CM | POA: Diagnosis not present

## 2018-03-22 DIAGNOSIS — N2581 Secondary hyperparathyroidism of renal origin: Secondary | ICD-10-CM | POA: Diagnosis not present

## 2018-03-22 DIAGNOSIS — Z992 Dependence on renal dialysis: Secondary | ICD-10-CM | POA: Diagnosis not present

## 2018-03-22 DIAGNOSIS — D509 Iron deficiency anemia, unspecified: Secondary | ICD-10-CM | POA: Diagnosis not present

## 2018-03-22 DIAGNOSIS — N186 End stage renal disease: Secondary | ICD-10-CM | POA: Diagnosis not present

## 2018-03-24 DIAGNOSIS — D631 Anemia in chronic kidney disease: Secondary | ICD-10-CM | POA: Diagnosis not present

## 2018-03-24 DIAGNOSIS — Z992 Dependence on renal dialysis: Secondary | ICD-10-CM | POA: Diagnosis not present

## 2018-03-24 DIAGNOSIS — D509 Iron deficiency anemia, unspecified: Secondary | ICD-10-CM | POA: Diagnosis not present

## 2018-03-24 DIAGNOSIS — N186 End stage renal disease: Secondary | ICD-10-CM | POA: Diagnosis not present

## 2018-03-24 DIAGNOSIS — N2581 Secondary hyperparathyroidism of renal origin: Secondary | ICD-10-CM | POA: Diagnosis not present

## 2018-03-25 DIAGNOSIS — N186 End stage renal disease: Secondary | ICD-10-CM | POA: Diagnosis not present

## 2018-03-25 DIAGNOSIS — Z992 Dependence on renal dialysis: Secondary | ICD-10-CM | POA: Diagnosis not present

## 2018-03-26 DIAGNOSIS — N2581 Secondary hyperparathyroidism of renal origin: Secondary | ICD-10-CM | POA: Diagnosis not present

## 2018-03-26 DIAGNOSIS — N186 End stage renal disease: Secondary | ICD-10-CM | POA: Diagnosis not present

## 2018-03-26 DIAGNOSIS — D509 Iron deficiency anemia, unspecified: Secondary | ICD-10-CM | POA: Diagnosis not present

## 2018-03-26 DIAGNOSIS — Z992 Dependence on renal dialysis: Secondary | ICD-10-CM | POA: Diagnosis not present

## 2018-03-26 DIAGNOSIS — D631 Anemia in chronic kidney disease: Secondary | ICD-10-CM | POA: Diagnosis not present

## 2018-03-29 DIAGNOSIS — D631 Anemia in chronic kidney disease: Secondary | ICD-10-CM | POA: Diagnosis not present

## 2018-03-29 DIAGNOSIS — N2581 Secondary hyperparathyroidism of renal origin: Secondary | ICD-10-CM | POA: Diagnosis not present

## 2018-03-29 DIAGNOSIS — Z992 Dependence on renal dialysis: Secondary | ICD-10-CM | POA: Diagnosis not present

## 2018-03-29 DIAGNOSIS — D509 Iron deficiency anemia, unspecified: Secondary | ICD-10-CM | POA: Diagnosis not present

## 2018-03-29 DIAGNOSIS — N186 End stage renal disease: Secondary | ICD-10-CM | POA: Diagnosis not present

## 2018-03-31 DIAGNOSIS — Z992 Dependence on renal dialysis: Secondary | ICD-10-CM | POA: Diagnosis not present

## 2018-03-31 DIAGNOSIS — N186 End stage renal disease: Secondary | ICD-10-CM | POA: Diagnosis not present

## 2018-03-31 DIAGNOSIS — D509 Iron deficiency anemia, unspecified: Secondary | ICD-10-CM | POA: Diagnosis not present

## 2018-03-31 DIAGNOSIS — D631 Anemia in chronic kidney disease: Secondary | ICD-10-CM | POA: Diagnosis not present

## 2018-03-31 DIAGNOSIS — N2581 Secondary hyperparathyroidism of renal origin: Secondary | ICD-10-CM | POA: Diagnosis not present

## 2018-04-02 DIAGNOSIS — D509 Iron deficiency anemia, unspecified: Secondary | ICD-10-CM | POA: Diagnosis not present

## 2018-04-02 DIAGNOSIS — Z992 Dependence on renal dialysis: Secondary | ICD-10-CM | POA: Diagnosis not present

## 2018-04-02 DIAGNOSIS — N2581 Secondary hyperparathyroidism of renal origin: Secondary | ICD-10-CM | POA: Diagnosis not present

## 2018-04-02 DIAGNOSIS — D631 Anemia in chronic kidney disease: Secondary | ICD-10-CM | POA: Diagnosis not present

## 2018-04-02 DIAGNOSIS — N186 End stage renal disease: Secondary | ICD-10-CM | POA: Diagnosis not present

## 2018-04-04 DIAGNOSIS — I10 Essential (primary) hypertension: Secondary | ICD-10-CM | POA: Diagnosis not present

## 2018-04-04 DIAGNOSIS — I5032 Chronic diastolic (congestive) heart failure: Secondary | ICD-10-CM | POA: Diagnosis not present

## 2018-04-04 DIAGNOSIS — F3289 Other specified depressive episodes: Secondary | ICD-10-CM | POA: Diagnosis not present

## 2018-04-04 DIAGNOSIS — Z0000 Encounter for general adult medical examination without abnormal findings: Secondary | ICD-10-CM | POA: Diagnosis not present

## 2018-04-04 DIAGNOSIS — Z1389 Encounter for screening for other disorder: Secondary | ICD-10-CM | POA: Diagnosis not present

## 2018-04-04 DIAGNOSIS — E7849 Other hyperlipidemia: Secondary | ICD-10-CM | POA: Diagnosis not present

## 2018-04-05 DIAGNOSIS — D509 Iron deficiency anemia, unspecified: Secondary | ICD-10-CM | POA: Diagnosis not present

## 2018-04-05 DIAGNOSIS — Z992 Dependence on renal dialysis: Secondary | ICD-10-CM | POA: Diagnosis not present

## 2018-04-05 DIAGNOSIS — N2581 Secondary hyperparathyroidism of renal origin: Secondary | ICD-10-CM | POA: Diagnosis not present

## 2018-04-05 DIAGNOSIS — D631 Anemia in chronic kidney disease: Secondary | ICD-10-CM | POA: Diagnosis not present

## 2018-04-05 DIAGNOSIS — N186 End stage renal disease: Secondary | ICD-10-CM | POA: Diagnosis not present

## 2018-04-07 ENCOUNTER — Ambulatory Visit (INDEPENDENT_AMBULATORY_CARE_PROVIDER_SITE_OTHER): Payer: Medicare Other | Admitting: Internal Medicine

## 2018-04-07 DIAGNOSIS — D509 Iron deficiency anemia, unspecified: Secondary | ICD-10-CM | POA: Diagnosis not present

## 2018-04-07 DIAGNOSIS — Z992 Dependence on renal dialysis: Secondary | ICD-10-CM | POA: Diagnosis not present

## 2018-04-07 DIAGNOSIS — N2581 Secondary hyperparathyroidism of renal origin: Secondary | ICD-10-CM | POA: Diagnosis not present

## 2018-04-07 DIAGNOSIS — N186 End stage renal disease: Secondary | ICD-10-CM | POA: Diagnosis not present

## 2018-04-07 DIAGNOSIS — D631 Anemia in chronic kidney disease: Secondary | ICD-10-CM | POA: Diagnosis not present

## 2018-04-08 DIAGNOSIS — E7849 Other hyperlipidemia: Secondary | ICD-10-CM | POA: Diagnosis not present

## 2018-04-09 DIAGNOSIS — D631 Anemia in chronic kidney disease: Secondary | ICD-10-CM | POA: Diagnosis not present

## 2018-04-09 DIAGNOSIS — N2581 Secondary hyperparathyroidism of renal origin: Secondary | ICD-10-CM | POA: Diagnosis not present

## 2018-04-09 DIAGNOSIS — Z992 Dependence on renal dialysis: Secondary | ICD-10-CM | POA: Diagnosis not present

## 2018-04-09 DIAGNOSIS — N186 End stage renal disease: Secondary | ICD-10-CM | POA: Diagnosis not present

## 2018-04-09 DIAGNOSIS — D509 Iron deficiency anemia, unspecified: Secondary | ICD-10-CM | POA: Diagnosis not present

## 2018-04-12 DIAGNOSIS — N2581 Secondary hyperparathyroidism of renal origin: Secondary | ICD-10-CM | POA: Diagnosis not present

## 2018-04-12 DIAGNOSIS — D509 Iron deficiency anemia, unspecified: Secondary | ICD-10-CM | POA: Diagnosis not present

## 2018-04-12 DIAGNOSIS — Z992 Dependence on renal dialysis: Secondary | ICD-10-CM | POA: Diagnosis not present

## 2018-04-12 DIAGNOSIS — N186 End stage renal disease: Secondary | ICD-10-CM | POA: Diagnosis not present

## 2018-04-12 DIAGNOSIS — D631 Anemia in chronic kidney disease: Secondary | ICD-10-CM | POA: Diagnosis not present

## 2018-04-13 ENCOUNTER — Ambulatory Visit (INDEPENDENT_AMBULATORY_CARE_PROVIDER_SITE_OTHER): Payer: Medicare Other | Admitting: Internal Medicine

## 2018-04-14 DIAGNOSIS — Z992 Dependence on renal dialysis: Secondary | ICD-10-CM | POA: Diagnosis not present

## 2018-04-14 DIAGNOSIS — N186 End stage renal disease: Secondary | ICD-10-CM | POA: Diagnosis not present

## 2018-04-14 DIAGNOSIS — N2581 Secondary hyperparathyroidism of renal origin: Secondary | ICD-10-CM | POA: Diagnosis not present

## 2018-04-14 DIAGNOSIS — D509 Iron deficiency anemia, unspecified: Secondary | ICD-10-CM | POA: Diagnosis not present

## 2018-04-14 DIAGNOSIS — D631 Anemia in chronic kidney disease: Secondary | ICD-10-CM | POA: Diagnosis not present

## 2018-04-16 DIAGNOSIS — N2581 Secondary hyperparathyroidism of renal origin: Secondary | ICD-10-CM | POA: Diagnosis not present

## 2018-04-16 DIAGNOSIS — N186 End stage renal disease: Secondary | ICD-10-CM | POA: Diagnosis not present

## 2018-04-16 DIAGNOSIS — D631 Anemia in chronic kidney disease: Secondary | ICD-10-CM | POA: Diagnosis not present

## 2018-04-16 DIAGNOSIS — D509 Iron deficiency anemia, unspecified: Secondary | ICD-10-CM | POA: Diagnosis not present

## 2018-04-16 DIAGNOSIS — Z992 Dependence on renal dialysis: Secondary | ICD-10-CM | POA: Diagnosis not present

## 2018-04-19 DIAGNOSIS — D509 Iron deficiency anemia, unspecified: Secondary | ICD-10-CM | POA: Diagnosis not present

## 2018-04-19 DIAGNOSIS — N186 End stage renal disease: Secondary | ICD-10-CM | POA: Diagnosis not present

## 2018-04-19 DIAGNOSIS — N2581 Secondary hyperparathyroidism of renal origin: Secondary | ICD-10-CM | POA: Diagnosis not present

## 2018-04-19 DIAGNOSIS — D631 Anemia in chronic kidney disease: Secondary | ICD-10-CM | POA: Diagnosis not present

## 2018-04-19 DIAGNOSIS — Z992 Dependence on renal dialysis: Secondary | ICD-10-CM | POA: Diagnosis not present

## 2018-04-21 DIAGNOSIS — N186 End stage renal disease: Secondary | ICD-10-CM | POA: Diagnosis not present

## 2018-04-21 DIAGNOSIS — D631 Anemia in chronic kidney disease: Secondary | ICD-10-CM | POA: Diagnosis not present

## 2018-04-21 DIAGNOSIS — N2581 Secondary hyperparathyroidism of renal origin: Secondary | ICD-10-CM | POA: Diagnosis not present

## 2018-04-21 DIAGNOSIS — Z992 Dependence on renal dialysis: Secondary | ICD-10-CM | POA: Diagnosis not present

## 2018-04-21 DIAGNOSIS — D509 Iron deficiency anemia, unspecified: Secondary | ICD-10-CM | POA: Diagnosis not present

## 2018-04-23 DIAGNOSIS — N186 End stage renal disease: Secondary | ICD-10-CM | POA: Diagnosis not present

## 2018-04-23 DIAGNOSIS — D509 Iron deficiency anemia, unspecified: Secondary | ICD-10-CM | POA: Diagnosis not present

## 2018-04-23 DIAGNOSIS — D631 Anemia in chronic kidney disease: Secondary | ICD-10-CM | POA: Diagnosis not present

## 2018-04-23 DIAGNOSIS — N2581 Secondary hyperparathyroidism of renal origin: Secondary | ICD-10-CM | POA: Diagnosis not present

## 2018-04-23 DIAGNOSIS — Z992 Dependence on renal dialysis: Secondary | ICD-10-CM | POA: Diagnosis not present

## 2018-04-26 DIAGNOSIS — N2581 Secondary hyperparathyroidism of renal origin: Secondary | ICD-10-CM | POA: Diagnosis not present

## 2018-04-26 DIAGNOSIS — N186 End stage renal disease: Secondary | ICD-10-CM | POA: Diagnosis not present

## 2018-04-26 DIAGNOSIS — D509 Iron deficiency anemia, unspecified: Secondary | ICD-10-CM | POA: Diagnosis not present

## 2018-04-26 DIAGNOSIS — Z992 Dependence on renal dialysis: Secondary | ICD-10-CM | POA: Diagnosis not present

## 2018-04-26 DIAGNOSIS — D631 Anemia in chronic kidney disease: Secondary | ICD-10-CM | POA: Diagnosis not present

## 2018-04-28 DIAGNOSIS — N186 End stage renal disease: Secondary | ICD-10-CM | POA: Diagnosis not present

## 2018-04-28 DIAGNOSIS — D509 Iron deficiency anemia, unspecified: Secondary | ICD-10-CM | POA: Diagnosis not present

## 2018-04-28 DIAGNOSIS — N2581 Secondary hyperparathyroidism of renal origin: Secondary | ICD-10-CM | POA: Diagnosis not present

## 2018-04-28 DIAGNOSIS — D631 Anemia in chronic kidney disease: Secondary | ICD-10-CM | POA: Diagnosis not present

## 2018-04-28 DIAGNOSIS — Z992 Dependence on renal dialysis: Secondary | ICD-10-CM | POA: Diagnosis not present

## 2018-04-30 DIAGNOSIS — N186 End stage renal disease: Secondary | ICD-10-CM | POA: Diagnosis not present

## 2018-04-30 DIAGNOSIS — D509 Iron deficiency anemia, unspecified: Secondary | ICD-10-CM | POA: Diagnosis not present

## 2018-04-30 DIAGNOSIS — N2581 Secondary hyperparathyroidism of renal origin: Secondary | ICD-10-CM | POA: Diagnosis not present

## 2018-04-30 DIAGNOSIS — D631 Anemia in chronic kidney disease: Secondary | ICD-10-CM | POA: Diagnosis not present

## 2018-04-30 DIAGNOSIS — Z992 Dependence on renal dialysis: Secondary | ICD-10-CM | POA: Diagnosis not present

## 2018-05-03 DIAGNOSIS — Z992 Dependence on renal dialysis: Secondary | ICD-10-CM | POA: Diagnosis not present

## 2018-05-03 DIAGNOSIS — D509 Iron deficiency anemia, unspecified: Secondary | ICD-10-CM | POA: Diagnosis not present

## 2018-05-03 DIAGNOSIS — D631 Anemia in chronic kidney disease: Secondary | ICD-10-CM | POA: Diagnosis not present

## 2018-05-03 DIAGNOSIS — N186 End stage renal disease: Secondary | ICD-10-CM | POA: Diagnosis not present

## 2018-05-03 DIAGNOSIS — N2581 Secondary hyperparathyroidism of renal origin: Secondary | ICD-10-CM | POA: Diagnosis not present

## 2018-05-05 DIAGNOSIS — N2581 Secondary hyperparathyroidism of renal origin: Secondary | ICD-10-CM | POA: Diagnosis not present

## 2018-05-05 DIAGNOSIS — N186 End stage renal disease: Secondary | ICD-10-CM | POA: Diagnosis not present

## 2018-05-05 DIAGNOSIS — D509 Iron deficiency anemia, unspecified: Secondary | ICD-10-CM | POA: Diagnosis not present

## 2018-05-05 DIAGNOSIS — D631 Anemia in chronic kidney disease: Secondary | ICD-10-CM | POA: Diagnosis not present

## 2018-05-05 DIAGNOSIS — Z992 Dependence on renal dialysis: Secondary | ICD-10-CM | POA: Diagnosis not present

## 2018-05-07 DIAGNOSIS — N2581 Secondary hyperparathyroidism of renal origin: Secondary | ICD-10-CM | POA: Diagnosis not present

## 2018-05-07 DIAGNOSIS — D509 Iron deficiency anemia, unspecified: Secondary | ICD-10-CM | POA: Diagnosis not present

## 2018-05-07 DIAGNOSIS — D631 Anemia in chronic kidney disease: Secondary | ICD-10-CM | POA: Diagnosis not present

## 2018-05-07 DIAGNOSIS — N186 End stage renal disease: Secondary | ICD-10-CM | POA: Diagnosis not present

## 2018-05-07 DIAGNOSIS — Z992 Dependence on renal dialysis: Secondary | ICD-10-CM | POA: Diagnosis not present

## 2018-05-09 DIAGNOSIS — M810 Age-related osteoporosis without current pathological fracture: Secondary | ICD-10-CM | POA: Diagnosis not present

## 2018-05-10 DIAGNOSIS — N186 End stage renal disease: Secondary | ICD-10-CM | POA: Diagnosis not present

## 2018-05-10 DIAGNOSIS — N2581 Secondary hyperparathyroidism of renal origin: Secondary | ICD-10-CM | POA: Diagnosis not present

## 2018-05-10 DIAGNOSIS — D631 Anemia in chronic kidney disease: Secondary | ICD-10-CM | POA: Diagnosis not present

## 2018-05-10 DIAGNOSIS — D509 Iron deficiency anemia, unspecified: Secondary | ICD-10-CM | POA: Diagnosis not present

## 2018-05-10 DIAGNOSIS — Z992 Dependence on renal dialysis: Secondary | ICD-10-CM | POA: Diagnosis not present

## 2018-05-12 DIAGNOSIS — N2581 Secondary hyperparathyroidism of renal origin: Secondary | ICD-10-CM | POA: Diagnosis not present

## 2018-05-12 DIAGNOSIS — Z992 Dependence on renal dialysis: Secondary | ICD-10-CM | POA: Diagnosis not present

## 2018-05-12 DIAGNOSIS — N186 End stage renal disease: Secondary | ICD-10-CM | POA: Diagnosis not present

## 2018-05-12 DIAGNOSIS — D631 Anemia in chronic kidney disease: Secondary | ICD-10-CM | POA: Diagnosis not present

## 2018-05-12 DIAGNOSIS — D509 Iron deficiency anemia, unspecified: Secondary | ICD-10-CM | POA: Diagnosis not present

## 2018-05-14 DIAGNOSIS — D509 Iron deficiency anemia, unspecified: Secondary | ICD-10-CM | POA: Diagnosis not present

## 2018-05-14 DIAGNOSIS — N2581 Secondary hyperparathyroidism of renal origin: Secondary | ICD-10-CM | POA: Diagnosis not present

## 2018-05-14 DIAGNOSIS — Z992 Dependence on renal dialysis: Secondary | ICD-10-CM | POA: Diagnosis not present

## 2018-05-14 DIAGNOSIS — D631 Anemia in chronic kidney disease: Secondary | ICD-10-CM | POA: Diagnosis not present

## 2018-05-14 DIAGNOSIS — N186 End stage renal disease: Secondary | ICD-10-CM | POA: Diagnosis not present

## 2018-05-17 DIAGNOSIS — Z992 Dependence on renal dialysis: Secondary | ICD-10-CM | POA: Diagnosis not present

## 2018-05-17 DIAGNOSIS — E119 Type 2 diabetes mellitus without complications: Secondary | ICD-10-CM | POA: Diagnosis not present

## 2018-05-17 DIAGNOSIS — N186 End stage renal disease: Secondary | ICD-10-CM | POA: Diagnosis not present

## 2018-05-17 DIAGNOSIS — D509 Iron deficiency anemia, unspecified: Secondary | ICD-10-CM | POA: Diagnosis not present

## 2018-05-17 DIAGNOSIS — D631 Anemia in chronic kidney disease: Secondary | ICD-10-CM | POA: Diagnosis not present

## 2018-05-17 DIAGNOSIS — N2581 Secondary hyperparathyroidism of renal origin: Secondary | ICD-10-CM | POA: Diagnosis not present

## 2018-05-19 DIAGNOSIS — N186 End stage renal disease: Secondary | ICD-10-CM | POA: Diagnosis not present

## 2018-05-19 DIAGNOSIS — D509 Iron deficiency anemia, unspecified: Secondary | ICD-10-CM | POA: Diagnosis not present

## 2018-05-19 DIAGNOSIS — N2581 Secondary hyperparathyroidism of renal origin: Secondary | ICD-10-CM | POA: Diagnosis not present

## 2018-05-19 DIAGNOSIS — D631 Anemia in chronic kidney disease: Secondary | ICD-10-CM | POA: Diagnosis not present

## 2018-05-19 DIAGNOSIS — Z992 Dependence on renal dialysis: Secondary | ICD-10-CM | POA: Diagnosis not present

## 2018-05-21 DIAGNOSIS — N2581 Secondary hyperparathyroidism of renal origin: Secondary | ICD-10-CM | POA: Diagnosis not present

## 2018-05-21 DIAGNOSIS — D631 Anemia in chronic kidney disease: Secondary | ICD-10-CM | POA: Diagnosis not present

## 2018-05-21 DIAGNOSIS — Z992 Dependence on renal dialysis: Secondary | ICD-10-CM | POA: Diagnosis not present

## 2018-05-21 DIAGNOSIS — D509 Iron deficiency anemia, unspecified: Secondary | ICD-10-CM | POA: Diagnosis not present

## 2018-05-21 DIAGNOSIS — N186 End stage renal disease: Secondary | ICD-10-CM | POA: Diagnosis not present

## 2018-05-24 DIAGNOSIS — N186 End stage renal disease: Secondary | ICD-10-CM | POA: Diagnosis not present

## 2018-05-24 DIAGNOSIS — D631 Anemia in chronic kidney disease: Secondary | ICD-10-CM | POA: Diagnosis not present

## 2018-05-24 DIAGNOSIS — Z992 Dependence on renal dialysis: Secondary | ICD-10-CM | POA: Diagnosis not present

## 2018-05-24 DIAGNOSIS — D509 Iron deficiency anemia, unspecified: Secondary | ICD-10-CM | POA: Diagnosis not present

## 2018-05-24 DIAGNOSIS — N2581 Secondary hyperparathyroidism of renal origin: Secondary | ICD-10-CM | POA: Diagnosis not present

## 2018-05-26 DIAGNOSIS — N186 End stage renal disease: Secondary | ICD-10-CM | POA: Diagnosis not present

## 2018-05-26 DIAGNOSIS — D509 Iron deficiency anemia, unspecified: Secondary | ICD-10-CM | POA: Diagnosis not present

## 2018-05-26 DIAGNOSIS — D631 Anemia in chronic kidney disease: Secondary | ICD-10-CM | POA: Diagnosis not present

## 2018-05-26 DIAGNOSIS — N2581 Secondary hyperparathyroidism of renal origin: Secondary | ICD-10-CM | POA: Diagnosis not present

## 2018-05-26 DIAGNOSIS — Z992 Dependence on renal dialysis: Secondary | ICD-10-CM | POA: Diagnosis not present

## 2018-05-28 DIAGNOSIS — D631 Anemia in chronic kidney disease: Secondary | ICD-10-CM | POA: Diagnosis not present

## 2018-05-28 DIAGNOSIS — N2581 Secondary hyperparathyroidism of renal origin: Secondary | ICD-10-CM | POA: Diagnosis not present

## 2018-05-28 DIAGNOSIS — Z992 Dependence on renal dialysis: Secondary | ICD-10-CM | POA: Diagnosis not present

## 2018-05-28 DIAGNOSIS — D509 Iron deficiency anemia, unspecified: Secondary | ICD-10-CM | POA: Diagnosis not present

## 2018-05-28 DIAGNOSIS — N186 End stage renal disease: Secondary | ICD-10-CM | POA: Diagnosis not present

## 2018-05-31 DIAGNOSIS — N2581 Secondary hyperparathyroidism of renal origin: Secondary | ICD-10-CM | POA: Diagnosis not present

## 2018-05-31 DIAGNOSIS — D631 Anemia in chronic kidney disease: Secondary | ICD-10-CM | POA: Diagnosis not present

## 2018-05-31 DIAGNOSIS — D509 Iron deficiency anemia, unspecified: Secondary | ICD-10-CM | POA: Diagnosis not present

## 2018-05-31 DIAGNOSIS — N186 End stage renal disease: Secondary | ICD-10-CM | POA: Diagnosis not present

## 2018-05-31 DIAGNOSIS — Z992 Dependence on renal dialysis: Secondary | ICD-10-CM | POA: Diagnosis not present

## 2018-06-02 DIAGNOSIS — N186 End stage renal disease: Secondary | ICD-10-CM | POA: Diagnosis not present

## 2018-06-02 DIAGNOSIS — D631 Anemia in chronic kidney disease: Secondary | ICD-10-CM | POA: Diagnosis not present

## 2018-06-02 DIAGNOSIS — N2581 Secondary hyperparathyroidism of renal origin: Secondary | ICD-10-CM | POA: Diagnosis not present

## 2018-06-02 DIAGNOSIS — Z992 Dependence on renal dialysis: Secondary | ICD-10-CM | POA: Diagnosis not present

## 2018-06-02 DIAGNOSIS — D509 Iron deficiency anemia, unspecified: Secondary | ICD-10-CM | POA: Diagnosis not present

## 2018-06-04 DIAGNOSIS — N2581 Secondary hyperparathyroidism of renal origin: Secondary | ICD-10-CM | POA: Diagnosis not present

## 2018-06-04 DIAGNOSIS — Z992 Dependence on renal dialysis: Secondary | ICD-10-CM | POA: Diagnosis not present

## 2018-06-04 DIAGNOSIS — D631 Anemia in chronic kidney disease: Secondary | ICD-10-CM | POA: Diagnosis not present

## 2018-06-04 DIAGNOSIS — D509 Iron deficiency anemia, unspecified: Secondary | ICD-10-CM | POA: Diagnosis not present

## 2018-06-04 DIAGNOSIS — N186 End stage renal disease: Secondary | ICD-10-CM | POA: Diagnosis not present

## 2018-06-07 DIAGNOSIS — D631 Anemia in chronic kidney disease: Secondary | ICD-10-CM | POA: Diagnosis not present

## 2018-06-07 DIAGNOSIS — Z992 Dependence on renal dialysis: Secondary | ICD-10-CM | POA: Diagnosis not present

## 2018-06-07 DIAGNOSIS — N2581 Secondary hyperparathyroidism of renal origin: Secondary | ICD-10-CM | POA: Diagnosis not present

## 2018-06-07 DIAGNOSIS — N186 End stage renal disease: Secondary | ICD-10-CM | POA: Diagnosis not present

## 2018-06-07 DIAGNOSIS — D509 Iron deficiency anemia, unspecified: Secondary | ICD-10-CM | POA: Diagnosis not present

## 2018-06-09 DIAGNOSIS — Z992 Dependence on renal dialysis: Secondary | ICD-10-CM | POA: Diagnosis not present

## 2018-06-09 DIAGNOSIS — D631 Anemia in chronic kidney disease: Secondary | ICD-10-CM | POA: Diagnosis not present

## 2018-06-09 DIAGNOSIS — N186 End stage renal disease: Secondary | ICD-10-CM | POA: Diagnosis not present

## 2018-06-09 DIAGNOSIS — N2581 Secondary hyperparathyroidism of renal origin: Secondary | ICD-10-CM | POA: Diagnosis not present

## 2018-06-09 DIAGNOSIS — D509 Iron deficiency anemia, unspecified: Secondary | ICD-10-CM | POA: Diagnosis not present

## 2018-06-11 DIAGNOSIS — N186 End stage renal disease: Secondary | ICD-10-CM | POA: Diagnosis not present

## 2018-06-11 DIAGNOSIS — N2581 Secondary hyperparathyroidism of renal origin: Secondary | ICD-10-CM | POA: Diagnosis not present

## 2018-06-11 DIAGNOSIS — D509 Iron deficiency anemia, unspecified: Secondary | ICD-10-CM | POA: Diagnosis not present

## 2018-06-11 DIAGNOSIS — D631 Anemia in chronic kidney disease: Secondary | ICD-10-CM | POA: Diagnosis not present

## 2018-06-11 DIAGNOSIS — Z992 Dependence on renal dialysis: Secondary | ICD-10-CM | POA: Diagnosis not present

## 2018-06-14 DIAGNOSIS — D509 Iron deficiency anemia, unspecified: Secondary | ICD-10-CM | POA: Diagnosis not present

## 2018-06-14 DIAGNOSIS — N2581 Secondary hyperparathyroidism of renal origin: Secondary | ICD-10-CM | POA: Diagnosis not present

## 2018-06-14 DIAGNOSIS — N186 End stage renal disease: Secondary | ICD-10-CM | POA: Diagnosis not present

## 2018-06-14 DIAGNOSIS — Z992 Dependence on renal dialysis: Secondary | ICD-10-CM | POA: Diagnosis not present

## 2018-06-14 DIAGNOSIS — D631 Anemia in chronic kidney disease: Secondary | ICD-10-CM | POA: Diagnosis not present

## 2018-06-16 DIAGNOSIS — Z992 Dependence on renal dialysis: Secondary | ICD-10-CM | POA: Diagnosis not present

## 2018-06-16 DIAGNOSIS — D631 Anemia in chronic kidney disease: Secondary | ICD-10-CM | POA: Diagnosis not present

## 2018-06-16 DIAGNOSIS — N2581 Secondary hyperparathyroidism of renal origin: Secondary | ICD-10-CM | POA: Diagnosis not present

## 2018-06-16 DIAGNOSIS — D509 Iron deficiency anemia, unspecified: Secondary | ICD-10-CM | POA: Diagnosis not present

## 2018-06-16 DIAGNOSIS — N186 End stage renal disease: Secondary | ICD-10-CM | POA: Diagnosis not present

## 2018-06-18 DIAGNOSIS — D509 Iron deficiency anemia, unspecified: Secondary | ICD-10-CM | POA: Diagnosis not present

## 2018-06-18 DIAGNOSIS — D631 Anemia in chronic kidney disease: Secondary | ICD-10-CM | POA: Diagnosis not present

## 2018-06-18 DIAGNOSIS — N2581 Secondary hyperparathyroidism of renal origin: Secondary | ICD-10-CM | POA: Diagnosis not present

## 2018-06-18 DIAGNOSIS — Z992 Dependence on renal dialysis: Secondary | ICD-10-CM | POA: Diagnosis not present

## 2018-06-18 DIAGNOSIS — N186 End stage renal disease: Secondary | ICD-10-CM | POA: Diagnosis not present

## 2018-06-21 DIAGNOSIS — D631 Anemia in chronic kidney disease: Secondary | ICD-10-CM | POA: Diagnosis not present

## 2018-06-21 DIAGNOSIS — D509 Iron deficiency anemia, unspecified: Secondary | ICD-10-CM | POA: Diagnosis not present

## 2018-06-21 DIAGNOSIS — Z992 Dependence on renal dialysis: Secondary | ICD-10-CM | POA: Diagnosis not present

## 2018-06-21 DIAGNOSIS — N2581 Secondary hyperparathyroidism of renal origin: Secondary | ICD-10-CM | POA: Diagnosis not present

## 2018-06-21 DIAGNOSIS — N186 End stage renal disease: Secondary | ICD-10-CM | POA: Diagnosis not present

## 2018-06-23 DIAGNOSIS — D631 Anemia in chronic kidney disease: Secondary | ICD-10-CM | POA: Diagnosis not present

## 2018-06-23 DIAGNOSIS — D509 Iron deficiency anemia, unspecified: Secondary | ICD-10-CM | POA: Diagnosis not present

## 2018-06-23 DIAGNOSIS — N186 End stage renal disease: Secondary | ICD-10-CM | POA: Diagnosis not present

## 2018-06-23 DIAGNOSIS — Z992 Dependence on renal dialysis: Secondary | ICD-10-CM | POA: Diagnosis not present

## 2018-06-23 DIAGNOSIS — N2581 Secondary hyperparathyroidism of renal origin: Secondary | ICD-10-CM | POA: Diagnosis not present

## 2018-06-25 DIAGNOSIS — N186 End stage renal disease: Secondary | ICD-10-CM | POA: Diagnosis not present

## 2018-06-25 DIAGNOSIS — D631 Anemia in chronic kidney disease: Secondary | ICD-10-CM | POA: Diagnosis not present

## 2018-06-25 DIAGNOSIS — D509 Iron deficiency anemia, unspecified: Secondary | ICD-10-CM | POA: Diagnosis not present

## 2018-06-25 DIAGNOSIS — N2581 Secondary hyperparathyroidism of renal origin: Secondary | ICD-10-CM | POA: Diagnosis not present

## 2018-06-25 DIAGNOSIS — Z992 Dependence on renal dialysis: Secondary | ICD-10-CM | POA: Diagnosis not present

## 2018-06-28 DIAGNOSIS — D509 Iron deficiency anemia, unspecified: Secondary | ICD-10-CM | POA: Diagnosis not present

## 2018-06-28 DIAGNOSIS — N2581 Secondary hyperparathyroidism of renal origin: Secondary | ICD-10-CM | POA: Diagnosis not present

## 2018-06-28 DIAGNOSIS — Z992 Dependence on renal dialysis: Secondary | ICD-10-CM | POA: Diagnosis not present

## 2018-06-28 DIAGNOSIS — N186 End stage renal disease: Secondary | ICD-10-CM | POA: Diagnosis not present

## 2018-06-28 DIAGNOSIS — D631 Anemia in chronic kidney disease: Secondary | ICD-10-CM | POA: Diagnosis not present

## 2018-06-30 DIAGNOSIS — N186 End stage renal disease: Secondary | ICD-10-CM | POA: Diagnosis not present

## 2018-06-30 DIAGNOSIS — D509 Iron deficiency anemia, unspecified: Secondary | ICD-10-CM | POA: Diagnosis not present

## 2018-06-30 DIAGNOSIS — N2581 Secondary hyperparathyroidism of renal origin: Secondary | ICD-10-CM | POA: Diagnosis not present

## 2018-06-30 DIAGNOSIS — Z992 Dependence on renal dialysis: Secondary | ICD-10-CM | POA: Diagnosis not present

## 2018-06-30 DIAGNOSIS — D631 Anemia in chronic kidney disease: Secondary | ICD-10-CM | POA: Diagnosis not present

## 2018-07-02 DIAGNOSIS — N2581 Secondary hyperparathyroidism of renal origin: Secondary | ICD-10-CM | POA: Diagnosis not present

## 2018-07-02 DIAGNOSIS — N186 End stage renal disease: Secondary | ICD-10-CM | POA: Diagnosis not present

## 2018-07-02 DIAGNOSIS — D509 Iron deficiency anemia, unspecified: Secondary | ICD-10-CM | POA: Diagnosis not present

## 2018-07-02 DIAGNOSIS — D631 Anemia in chronic kidney disease: Secondary | ICD-10-CM | POA: Diagnosis not present

## 2018-07-02 DIAGNOSIS — Z992 Dependence on renal dialysis: Secondary | ICD-10-CM | POA: Diagnosis not present

## 2018-07-05 DIAGNOSIS — D631 Anemia in chronic kidney disease: Secondary | ICD-10-CM | POA: Diagnosis not present

## 2018-07-05 DIAGNOSIS — D509 Iron deficiency anemia, unspecified: Secondary | ICD-10-CM | POA: Diagnosis not present

## 2018-07-05 DIAGNOSIS — N2581 Secondary hyperparathyroidism of renal origin: Secondary | ICD-10-CM | POA: Diagnosis not present

## 2018-07-05 DIAGNOSIS — N186 End stage renal disease: Secondary | ICD-10-CM | POA: Diagnosis not present

## 2018-07-05 DIAGNOSIS — Z992 Dependence on renal dialysis: Secondary | ICD-10-CM | POA: Diagnosis not present

## 2018-07-07 DIAGNOSIS — Z992 Dependence on renal dialysis: Secondary | ICD-10-CM | POA: Diagnosis not present

## 2018-07-07 DIAGNOSIS — N186 End stage renal disease: Secondary | ICD-10-CM | POA: Diagnosis not present

## 2018-07-07 DIAGNOSIS — D631 Anemia in chronic kidney disease: Secondary | ICD-10-CM | POA: Diagnosis not present

## 2018-07-07 DIAGNOSIS — D509 Iron deficiency anemia, unspecified: Secondary | ICD-10-CM | POA: Diagnosis not present

## 2018-07-07 DIAGNOSIS — N2581 Secondary hyperparathyroidism of renal origin: Secondary | ICD-10-CM | POA: Diagnosis not present

## 2018-07-09 DIAGNOSIS — N186 End stage renal disease: Secondary | ICD-10-CM | POA: Diagnosis not present

## 2018-07-09 DIAGNOSIS — D631 Anemia in chronic kidney disease: Secondary | ICD-10-CM | POA: Diagnosis not present

## 2018-07-09 DIAGNOSIS — D509 Iron deficiency anemia, unspecified: Secondary | ICD-10-CM | POA: Diagnosis not present

## 2018-07-09 DIAGNOSIS — Z992 Dependence on renal dialysis: Secondary | ICD-10-CM | POA: Diagnosis not present

## 2018-07-09 DIAGNOSIS — N2581 Secondary hyperparathyroidism of renal origin: Secondary | ICD-10-CM | POA: Diagnosis not present

## 2018-07-12 DIAGNOSIS — Z992 Dependence on renal dialysis: Secondary | ICD-10-CM | POA: Diagnosis not present

## 2018-07-12 DIAGNOSIS — N2581 Secondary hyperparathyroidism of renal origin: Secondary | ICD-10-CM | POA: Diagnosis not present

## 2018-07-12 DIAGNOSIS — N186 End stage renal disease: Secondary | ICD-10-CM | POA: Diagnosis not present

## 2018-07-12 DIAGNOSIS — D509 Iron deficiency anemia, unspecified: Secondary | ICD-10-CM | POA: Diagnosis not present

## 2018-07-12 DIAGNOSIS — D631 Anemia in chronic kidney disease: Secondary | ICD-10-CM | POA: Diagnosis not present

## 2018-07-14 DIAGNOSIS — D631 Anemia in chronic kidney disease: Secondary | ICD-10-CM | POA: Diagnosis not present

## 2018-07-14 DIAGNOSIS — N186 End stage renal disease: Secondary | ICD-10-CM | POA: Diagnosis not present

## 2018-07-14 DIAGNOSIS — D509 Iron deficiency anemia, unspecified: Secondary | ICD-10-CM | POA: Diagnosis not present

## 2018-07-14 DIAGNOSIS — Z992 Dependence on renal dialysis: Secondary | ICD-10-CM | POA: Diagnosis not present

## 2018-07-14 DIAGNOSIS — N2581 Secondary hyperparathyroidism of renal origin: Secondary | ICD-10-CM | POA: Diagnosis not present

## 2018-07-16 DIAGNOSIS — Z992 Dependence on renal dialysis: Secondary | ICD-10-CM | POA: Diagnosis not present

## 2018-07-16 DIAGNOSIS — N186 End stage renal disease: Secondary | ICD-10-CM | POA: Diagnosis not present

## 2018-07-16 DIAGNOSIS — D509 Iron deficiency anemia, unspecified: Secondary | ICD-10-CM | POA: Diagnosis not present

## 2018-07-16 DIAGNOSIS — D631 Anemia in chronic kidney disease: Secondary | ICD-10-CM | POA: Diagnosis not present

## 2018-07-16 DIAGNOSIS — N2581 Secondary hyperparathyroidism of renal origin: Secondary | ICD-10-CM | POA: Diagnosis not present

## 2018-07-19 ENCOUNTER — Other Ambulatory Visit (HOSPITAL_COMMUNITY): Payer: Self-pay | Admitting: Medical

## 2018-07-19 ENCOUNTER — Other Ambulatory Visit (HOSPITAL_COMMUNITY): Payer: Self-pay | Admitting: Emergency Medicine

## 2018-07-19 DIAGNOSIS — T8241XA Breakdown (mechanical) of vascular dialysis catheter, initial encounter: Secondary | ICD-10-CM

## 2018-07-20 ENCOUNTER — Ambulatory Visit (HOSPITAL_COMMUNITY)
Admission: RE | Admit: 2018-07-20 | Discharge: 2018-07-20 | Disposition: A | Discharge status: Home / Self Care | Payer: Medicare Other | Source: Ambulatory Visit | Attending: Medical | Admitting: Medical

## 2018-07-20 ENCOUNTER — Other Ambulatory Visit (HOSPITAL_COMMUNITY): Payer: Self-pay | Admitting: Medical

## 2018-07-20 ENCOUNTER — Other Ambulatory Visit: Payer: Self-pay

## 2018-07-20 ENCOUNTER — Encounter (HOSPITAL_COMMUNITY): Payer: Self-pay

## 2018-07-20 DIAGNOSIS — Z7982 Long term (current) use of aspirin: Secondary | ICD-10-CM | POA: Insufficient documentation

## 2018-07-20 DIAGNOSIS — T8241XA Breakdown (mechanical) of vascular dialysis catheter, initial encounter: Secondary | ICD-10-CM

## 2018-07-20 DIAGNOSIS — Z8249 Family history of ischemic heart disease and other diseases of the circulatory system: Secondary | ICD-10-CM | POA: Diagnosis not present

## 2018-07-20 DIAGNOSIS — Z8673 Personal history of transient ischemic attack (TIA), and cerebral infarction without residual deficits: Secondary | ICD-10-CM | POA: Insufficient documentation

## 2018-07-20 DIAGNOSIS — T82868A Thrombosis of vascular prosthetic devices, implants and grafts, initial encounter: Secondary | ICD-10-CM | POA: Insufficient documentation

## 2018-07-20 DIAGNOSIS — E785 Hyperlipidemia, unspecified: Secondary | ICD-10-CM | POA: Diagnosis not present

## 2018-07-20 DIAGNOSIS — T82898A Other specified complication of vascular prosthetic devices, implants and grafts, initial encounter: Secondary | ICD-10-CM | POA: Insufficient documentation

## 2018-07-20 DIAGNOSIS — Z79899 Other long term (current) drug therapy: Secondary | ICD-10-CM | POA: Insufficient documentation

## 2018-07-20 DIAGNOSIS — Z992 Dependence on renal dialysis: Secondary | ICD-10-CM | POA: Insufficient documentation

## 2018-07-20 DIAGNOSIS — I509 Heart failure, unspecified: Secondary | ICD-10-CM | POA: Diagnosis not present

## 2018-07-20 DIAGNOSIS — N186 End stage renal disease: Secondary | ICD-10-CM | POA: Diagnosis not present

## 2018-07-20 DIAGNOSIS — Y832 Surgical operation with anastomosis, bypass or graft as the cause of abnormal reaction of the patient, or of later complication, without mention of misadventure at the time of the procedure: Secondary | ICD-10-CM | POA: Diagnosis not present

## 2018-07-20 DIAGNOSIS — I132 Hypertensive heart and chronic kidney disease with heart failure and with stage 5 chronic kidney disease, or end stage renal disease: Secondary | ICD-10-CM | POA: Diagnosis not present

## 2018-07-20 DIAGNOSIS — I2510 Atherosclerotic heart disease of native coronary artery without angina pectoris: Secondary | ICD-10-CM | POA: Insufficient documentation

## 2018-07-20 DIAGNOSIS — I252 Old myocardial infarction: Secondary | ICD-10-CM | POA: Diagnosis not present

## 2018-07-20 DIAGNOSIS — E1122 Type 2 diabetes mellitus with diabetic chronic kidney disease: Secondary | ICD-10-CM | POA: Insufficient documentation

## 2018-07-20 HISTORY — PX: IR THROMBECTOMY AV FISTULA W/THROMBOLYSIS/PTA INC/SHUNT/IMG RIGHT: IMG6119

## 2018-07-20 HISTORY — PX: IR US GUIDE VASC ACCESS RIGHT: IMG2390

## 2018-07-20 HISTORY — PX: IR AV DIALY SHUNT INTRO NEEDLE/INTRAC INITIAL W/PTA/STENT/IMG RT: IMG6117

## 2018-07-20 LAB — POCT I-STAT 4, (NA,K, GLUC, HGB,HCT)
Glucose, Bld: 76 mg/dL (ref 70–99)
HCT: 31 % — ABNORMAL LOW (ref 36.0–46.0)
Hemoglobin: 10.5 g/dL — ABNORMAL LOW (ref 12.0–15.0)
Potassium: 5.3 mmol/L — ABNORMAL HIGH (ref 3.5–5.1)
Sodium: 139 mmol/L (ref 135–145)

## 2018-07-20 LAB — CBC
HCT: 29.3 % — ABNORMAL LOW (ref 36.0–46.0)
Hemoglobin: 8.5 g/dL — ABNORMAL LOW (ref 12.0–15.0)
MCH: 29.7 pg (ref 26.0–34.0)
MCHC: 29 g/dL — ABNORMAL LOW (ref 30.0–36.0)
MCV: 102.4 fL — ABNORMAL HIGH (ref 80.0–100.0)
Platelets: 97 10*3/uL — ABNORMAL LOW (ref 150–400)
RBC: 2.86 MIL/uL — ABNORMAL LOW (ref 3.87–5.11)
RDW: 16.6 % — ABNORMAL HIGH (ref 11.5–15.5)
WBC: 5.7 10*3/uL (ref 4.0–10.5)
nRBC: 0 % (ref 0.0–0.2)

## 2018-07-20 LAB — PROTIME-INR
INR: 1.2 (ref 0.8–1.2)
Prothrombin Time: 14.7 seconds (ref 11.4–15.2)

## 2018-07-20 LAB — GLUCOSE, CAPILLARY
Glucose-Capillary: 73 mg/dL (ref 70–99)
Glucose-Capillary: 86 mg/dL (ref 70–99)

## 2018-07-20 LAB — BASIC METABOLIC PANEL
Anion gap: 14 (ref 5–15)
BUN: 87 mg/dL — ABNORMAL HIGH (ref 8–23)
CO2: 18 mmol/L — ABNORMAL LOW (ref 22–32)
Calcium: 8.2 mg/dL — ABNORMAL LOW (ref 8.9–10.3)
Chloride: 107 mmol/L (ref 98–111)
Creatinine, Ser: 9.64 mg/dL — ABNORMAL HIGH (ref 0.44–1.00)
GFR calc Af Amer: 4 mL/min — ABNORMAL LOW (ref >60)
GFR calc non Af Amer: 4 mL/min — ABNORMAL LOW (ref >60)
Glucose, Bld: 76 mg/dL (ref 70–99)
Potassium: 6.5 mmol/L (ref 3.5–5.1)
Sodium: 139 mmol/L (ref 135–145)

## 2018-07-20 MED ORDER — MIDAZOLAM HCL 2 MG/2ML IJ SOLN
INTRAMUSCULAR | Status: AC
  Filled 2018-07-20: qty 2

## 2018-07-20 MED ORDER — FENTANYL CITRATE (PF) 100 MCG/2ML IJ SOLN
INTRAMUSCULAR | Status: AC | PRN
  Administered 2018-07-20: 25 ug via INTRAVENOUS

## 2018-07-20 MED ORDER — IOHEXOL 300 MG/ML  SOLN
50.0000 mL | Freq: Once | INTRAMUSCULAR | Status: AC | PRN
  Administered 2018-07-20: 20 mL

## 2018-07-20 MED ORDER — HEPARIN SODIUM (PORCINE) 1000 UNIT/ML IJ SOLN
INTRAMUSCULAR | Status: AC | PRN
  Administered 2018-07-20: 3000 [IU] via INTRAVENOUS

## 2018-07-20 MED ORDER — SODIUM CHLORIDE 0.9 % IV SOLN: INTRAVENOUS | Status: DC

## 2018-07-20 MED ORDER — ALTEPLASE 2 MG IJ SOLR
INTRAMUSCULAR | Status: AC | PRN
  Administered 2018-07-20: 2 mg

## 2018-07-20 MED ORDER — ALTEPLASE 2 MG IJ SOLR
INTRAMUSCULAR | Status: AC
  Filled 2018-07-20: qty 2

## 2018-07-20 MED ORDER — FENTANYL CITRATE (PF) 100 MCG/2ML IJ SOLN
INTRAMUSCULAR | Status: AC
  Filled 2018-07-20: qty 2

## 2018-07-20 MED ORDER — MIDAZOLAM HCL 2 MG/2ML IJ SOLN
INTRAMUSCULAR | Status: AC | PRN
  Administered 2018-07-20: 0.5 mg via INTRAVENOUS

## 2018-07-20 MED ORDER — IOHEXOL 300 MG/ML  SOLN
100.0000 mL | Freq: Once | INTRAMUSCULAR | Status: AC | PRN
  Administered 2018-07-20: 70 mL

## 2018-07-20 MED ORDER — LIDOCAINE HCL 1 % IJ SOLN
INTRAMUSCULAR | Status: AC
  Filled 2018-07-20: qty 20

## 2018-07-20 MED ORDER — HEPARIN SODIUM (PORCINE) 1000 UNIT/ML IJ SOLN
INTRAMUSCULAR | Status: AC
  Filled 2018-07-20: qty 1

## 2018-07-20 NOTE — Discharge Instructions (Signed)
Dialysis Vascular Access Malfunction        A vascular access is an entrance to your blood vessels that can be used for dialysis. Dialysis is a treatment used for kidney failure. A health care provider can make a vascular access in many ways, such as by:  Joining an artery to a vein under your skin to make a bigger blood vessel called a fistula.  Joining an artery to a vein under your skin using a soft tube called a graft.  Placing a thin, flexible tube (catheter) in a large vein, usually in your neck, chest, or groin. A vascular access can become blocked or stop working correctly (malfunction). What can cause my vascular access to malfunction?  Infection. This is the most common cause of malfunction.  A blood clot inside a part of the fistula, graft, or catheter. A blood clot can completely or partially block the flow of blood.  A kink in the graft or catheter.  A collection of blood (hematoma or bruise) next to the graft or catheter that pushes against it, blocking the flow of blood. What are signs and symptoms of vascular access malfunction? Signs and symptoms of vascular access malfunction include:  A change in the vibration or pulse (thrill) of your fistula or graft.  The thrill of your fistula or graft being gone.  New or unusual swelling of the area around the access.  An unsuccessful puncture of your access by the dialysis team.  The flow of blood through the fistula, graft, or catheter being too slow for effective dialysis.  Bleeding that cannot be easily controlled when routine dialysis is completed and the needle is removed.  Signs of infection such as pain, swelling, redness, red streaks, numbness, and blood or pus coming from or around the access. What happens if my vascular access malfunctions? If your vascular access malfunctions, your health care provider may order blood work, cultures, or an X-ray test to find out what went wrong. The X-ray test involves the  injection of a dye (contrast) into the vascular access. The contrast shows up on the X-ray and lets your health care provider see if there is a blockage in the vascular access. Treatment varies depending on the cause of the malfunction:  If the vascular access is infected, your health care provider may prescribe antibiotic medicine to control the infection.  If a clot is found in the vascular access, you may need surgery to remove the clot. Blood thinning medicines may also be used.  If a blockage in the vascular access is due to some other cause, such as a kink in a graft, you will likely need surgery to unblock or replace the graft.  If there is a malfunction for any reason, your health care provider may remove and replace your access. Follow these instructions at home: Medicines  Take over-the-counter and prescription medicines only as told by your health care provider.  If you were prescribed an antibiotic medicine, use it as told by your health care provider. Do not stop using the antibiotic even if you start to feel better. Activity  Do not lift heavy things with the arm that has the access.  Try not to bump or hurt the arm that has the access. Lifestyle  Do not wear jewelry or tight clothing around the access.  Do not sleep by placing the access arm under your head or body. General instructions  Wash your hands with soap and water before touching the area around the   vascular access. If soap and water are not available, use hand sanitizer.  Keep all follow-up visits as told by your health care provider. This is very important. Any delay in follow-up could cause permanent dysfunction of the vascular access, which may be dangerous.  Do not measure your blood pressure on the arm that has the access.  Keep the access area clean and do not use makeup, creams, lotions, or ointments on it.  Check your access area every day for signs of infection. Check for: ? More redness,  swelling, or pain. ? More fluid or blood. ? Warmth. ? Pus or a bad smell. Contact a health care provider if:  Swelling around the vascular access gets worse.  You develop new pain. Get help right away if:  You have bleeding at the vascular access that cannot be easily controlled.  You have pain, numbness, an unusual pale skin color, or blue fingers or sores at the tips of your fingers in the hand on the side of your fistula.  You have chills.  You have a fever.  You have pus or other fluid (drainage) at the vascular access site.  You develop skin redness or red streaking on the skin around, above, or below the vascular access.  Your access is hot, swollen, red, and very painful.  You can suddenly see the cuff of your catheter used in the access. Summary  A vascular access is an entrance to your blood vessels that can be used for dialysis.  Several things can cause your vascular access to malfunction.  Treatment varies depending on the cause of the malfunction.  Keep all follow-up visits as told by your health care provider. Any delay in follow-up could cause permanent dysfunction of the vascular access, which may be dangerous. This information is not intended to replace advice given to you by your health care provider. Make sure you discuss any questions you have with your health care provider. Document Released: 01/12/2006 Document Revised: 03/06/2016 Document Reviewed: 03/06/2016 Elsevier Interactive Patient Education  2019 [employer]. Moderate Conscious Sedation, Adult, Care After These instructions provide you with information about caring for yourself after your procedure. Your health care provider may also give you more specific instructions. Your treatment has been planned according to current medical practices, but problems sometimes occur. Call your health care provider if you have any problems or questions after your procedure. What can I expect after the  procedure? After your procedure, it is common:  To feel sleepy for several hours.  To feel clumsy and have poor balance for several hours.  To have poor judgment for several hours.  To vomit if you eat too soon. Follow these instructions at home: For at least 24 hours after the procedure:   Do not: ? Participate in activities where you could fall or become injured. ? Drive. ? Use heavy machinery. ? Drink alcohol. ? Take sleeping pills or medicines that cause drowsiness. ? Make important decisions or sign legal documents. ? Take care of children on your own.  Rest. Eating and drinking  Follow the diet recommended by your health care provider.  If you vomit: ? Drink water, juice, or soup when you can drink without vomiting. ? Make sure you have little or no nausea before eating solid foods. General instructions  Have a responsible adult stay with you until you are awake and alert.  Take over-the-counter and prescription medicines only as told by your health care provider.  If you smoke, do not  smoke without supervision.  Keep all follow-up visits as told by your health care provider. This is important. Contact a health care provider if:  You keep feeling nauseous or you keep vomiting.  You feel light-headed.  You develop a rash.  You have a fever. Get help right away if:  You have trouble breathing. This information is not intended to replace advice given to you by your health care provider. Make sure you discuss any questions you have with your health care provider. Document Released: 11/30/2012 Document Revised: 07/15/2015 Document Reviewed: 06/01/2015 Elsevier Interactive Patient Education  2019 [employer].

## 2018-07-20 NOTE — Sedation Documentation (Signed)
No sedation given.

## 2018-07-20 NOTE — H&P (Signed)
Chief Complaint: Patient was seen in consultation today for right arm dialysis access thrombolysis with possible intervention at the request of Lyles,Charles  Referring Physician(s): Lyles,Charles Dr Lowanda Foster  Supervising Physician: Markus Daft  Patient Status: [facility] - Out-pt  History of Present Illness: Courtney [name] is a 78 y.o. female   ESRD Rt arm dialysis access- placed 2017 Clotted Last use Sat without problem-- completed dialysis Tuesday-- unable to use- clotted  Declot 09/29/17:  FINDINGS: Successful revascularization of the occluded right upper arm graft. Mild irregularity of the outflow vein but no significant stenosis in this area. Central veins are patent. IMPRESSION: Successful declot of the right upper extremity graft. PLAN: The graft is ready for use. The graft remains amenable to percutaneous intervention.  Scheduled for thrombolysis with possible angioplasty/stent placement Possible tunneled dialysis catheter placement    Past Medical History:  Diagnosis Date  . CHF (congestive heart failure) ([place])   . Coronary artery disease   . Diabetes mellitus without complication ([place])   . ESRD on dialysis ([place]) 02/10/2017  . Hyperlipidemia   . Hypertension   . Myocardial infarct ([place])   . Stroke [facility])    2-3 yrs ago- right sided weakness    Past Surgical History:  Procedure Laterality Date  . APPENDECTOMY    . AV FISTULA PLACEMENT Right 09/20/2015   Procedure: PLACEMENT OF RIGHT UPPER EXTREMITY ARTERIOVENOUS GORE-TEX GRAFT FOR HEMODIALYSIS ACCESS;  Surgeon: Vickie Epley, MD;  Location: [facility];  Service: Vascular;  Laterality: Right;  . CATARACT EXTRACTION W/PHACO Left 06/29/2013   Procedure: CATARACT EXTRACTION PHACO AND INTRAOCULAR LENS PLACEMENT (IOC);  Surgeon: Tonny Branch, MD;  Location: [facility];  Service: Ophthalmology;  Laterality: Left;  CDE 12.25  . CATARACT EXTRACTION W/PHACO Right 07/27/2013   Procedure: CATARACT EXTRACTION PHACO AND INTRAOCULAR  LENS PLACEMENT (IOC);  Surgeon: Tonny Branch, MD;  Location: [facility];  Service: Ophthalmology;  Laterality: Right;  CDE:7.72  . INSERTION OF DIALYSIS CATHETER Right 09/11/2015   Procedure: INSERTION OF TUNNELED DIALYSIS CATHETER;  Surgeon: Vickie Epley, MD;  Location: [facility];  Service: Vascular;  Laterality: Right;  . INTRAMEDULLARY (IM) NAIL INTERTROCHANTERIC Right 07/20/2012   Procedure: INTRAMEDULLARY (IM) NAIL INTERTROCHANTRIC;  Surgeon: Carole Civil, MD;  Location: [facility];  Service: Orthopedics;  Laterality: Right;  . IR GENERIC HISTORICAL  09/25/2015   IR US GUIDE VASC ACCESS RIGHT 09/25/2015 Sandi Mariscal, MD [facility]  . IR GENERIC HISTORICAL  09/25/2015   IR FLUORO GUIDE CV LINE RIGHT 09/25/2015 Sandi Mariscal, MD [facility]  . IR GENERIC HISTORICAL  09/25/2015   IR REMOVAL TUN CV CATH W/O FL 09/25/2015 Sandi Mariscal, MD [facility]  . IR THROMBECTOMY AV FISTULA W/THROMBOLYSIS/PTA INC/SHUNT/IMG RIGHT Right 09/06/2017  . IR THROMBECTOMY AV FISTULA W/THROMBOLYSIS/PTA INC/SHUNT/IMG RIGHT Right 09/29/2017  . IR US GUIDE VASC ACCESS RIGHT  09/06/2017  . IR US GUIDE VASC ACCESS RIGHT  09/29/2017    Allergies: Grapefruit flavor [flavoring agent]  Medications: Prior to Admission medications   Medication Sig Start Date End Date Taking? Authorizing Provider  aspirin EC 81 MG tablet Take 81 mg by mouth daily.    [provider]  atorvastatin (LIPITOR) 20 MG tablet Take 20 mg by mouth daily.    [provider]  calcium-vitamin D (OSCAL WITH D) 500-200 MG-UNIT tablet Take 1 tablet by mouth.    [provider]  cholecalciferol (VITAMIN D) 400 units TABS tablet Take 1,000 Units by mouth daily.     [provider]  escitalopram (LEXAPRO) 5 MG tablet Take 5 mg by mouth daily.    [provider]  furosemide (LASIX) 20 MG tablet Take 20 mg by mouth daily.     [provider]  hydrALAZINE (APRESOLINE) 25 MG tablet Take 25 mg by mouth 3 (three) times daily.  07/22/12   Kathie Dike, MD  isosorbide mononitrate (IMDUR) 30 MG 24 hr tablet Take 30 mg by mouth daily.    [provider]  K Phos Mono-Sod Phos Di & Mono (PHOSPHA 250 NEUTRAL) [phone] MG TABS Take 1 tablet by mouth daily.     [provider]  loperamide (IMODIUM A-D) 2 MG tablet Take 2 mg by mouth every 6 (six) hours as needed for diarrhea or loose stools.    [provider]  omeprazole (PRILOSEC) 40 MG capsule Take 1 capsule (40 mg total) by mouth daily. 02/10/17   Setzer, Rona Ravens, NP  oxyCODONE-acetaminophen (ROXICET) 5-325 MG tablet Take 1 tablet by mouth every 6 (six) hours as needed for severe pain. 09/20/15   Vickie Epley, MD     Family History  Problem Relation Age of Onset  . Heart disease Mother   . Heart disease Father     Social History   Socioeconomic History  . Marital status: Single    Spouse name: Not on file  . Number of children: Not on file  . Years of education: Not on file  . Highest education level: Not on file  Occupational History  . Not on file  Social Needs  . Financial resource strain: Not on file  . Food insecurity:    Worry: Not on file    Inability: Not on file  . Transportation needs:    Medical: Not on file    Non-medical: Not on file  Tobacco Use  . Smoking status: Never Smoker  . Smokeless tobacco: Never Used  Substance and Sexual Activity  . Alcohol use: No  . Drug use: No  . Sexual activity: Never    Birth control/protection: None  Lifestyle  . Physical activity:    Days per week: Not on file    Minutes per session: Not on file  . Stress: Not on file  Relationships  . Social connections:    Talks on phone: Not on file    Gets together: Not on file    Attends religious service: Not on file    Active member of club or organization: Not on file    Attends meetings of clubs or organizations: Not on file    Relationship status: Not on file  Other Topics Concern  . Not on file  Social History  Narrative  . Not on file    Review of Systems: A 12 point ROS discussed and pertinent positives are indicated in the HPI above.  All other systems are negative.  Review of Systems  Constitutional: Positive for fatigue. Negative for activity change and fever.  HENT: Positive for hearing loss.   Respiratory: Negative for cough and shortness of breath.   Cardiovascular: Negative for chest pain.  Gastrointestinal: Negative for abdominal pain.  Neurological: Positive for weakness.  Psychiatric/Behavioral: Negative for behavioral problems and confusion.    Vital Signs: There were no vitals taken for this visit.  Physical Exam Vitals signs reviewed.  Constitutional:      Appearance: She is ill-appearing.  Cardiovascular:     Rate and Rhythm: Normal rate and regular rhythm.  Pulmonary:     Effort:  Pulmonary effort is normal.     Breath sounds: Normal breath sounds.  Abdominal:     General: Bowel sounds are normal.  Musculoskeletal: Normal range of motion.  Skin:    General: Skin is warm and dry.  Neurological:     Mental Status: She is alert and oriented to person, place, and time.     Comments: Pt with hearing loss [place] Consented with son [name] at bedside And with pt  Psychiatric:        Mood and Affect: Mood normal.        Behavior: Behavior normal.        Thought Content: Thought content normal.        Judgment: Judgment normal.     Imaging: No results found.  Labs:  CBC: Recent Labs    09/06/17 1226 09/08/17 1408  WBC 4.8 4.2  HGB 10.6* 11.0*  HCT 34.0* 34.7*  PLT 146* 117*    COAGS: Recent Labs    09/06/17 1226 09/08/17 1408  INR 1.09 1.16    BMP: Recent Labs    09/06/17 1226 09/08/17 1408  NA 144 142  K 4.3 3.4*  CL 108 104  CO2 25 26  GLUCOSE 72 80  BUN 45* 22  CALCIUM 8.1* 8.0*  CREATININE 7.19* 4.28*  GFRNONAA 5* 9*  GFRAA 6* 11*    LIVER FUNCTION TESTS: No results for input(s): BILITOT, AST, ALT, ALKPHOS, PROT, ALBUMIN in  the last 8760 hours.  TUMOR MARKERS: No results for input(s): AFPTM, CEA, CA199, CHROMGRNA in the last 8760 hours.  Assessment and Plan:  Right arm dialysis access clotted Scheduled for thrombolysis with possible angioplasty/stent placement Possible tunneled dialysis catheter placement Risks and benefits discussed with the patient including, but not limited to bleeding, infection, vascular injury, pulmonary embolism, need for tunneled HD catheter placement or even death.  All of the patient's questions were answered, patient is agreeable to proceed. Consent signed and in chart.   Thank you for this interesting consult.  I greatly enjoyed meeting ESTLE SABELLA and look forward to participating in their care.  A copy of this report was sent to the requesting provider on this date.  Electronically Signed: Lavonia Drafts, PA-C 07/20/2018, 7:41 AM   I spent a total of  30 Minutes   in face to face in clinical consultation, greater than 50% of which was counseling/coordinating care for right arm dialysis access declot

## 2018-07-20 NOTE — Progress Notes (Signed)
Patient transported back to radiology per Dr. Anselm Pancoast request

## 2018-07-20 NOTE — Procedures (Signed)
Interventional Radiology Procedure:   Indications: Recent declot of right arm AV graft.  After reviewing declot images, area that was thought to represent a draining vein is concerning for a small pseudoaneurysm.  Brought patient back to IR and evaluated right axilla with [place].  [place] confirmed a pseudoaneurysm.  Therefore, plan for repeat shuntogram and stent placement.    Procedure: Right AV graft shuntogram with placement of covered stent over venous anastomosis and outflow vein.  Findings: 1 or 2 pseudoaneurysms involving right axillary outflow vein.  Pseudoaneurysms successfully treated with 7 mm x 60 mm Covera stent.  Excellent flow in graft and outflow vein at end of procedure.     Complications: None     EBL: Less than 20 ml  Plan: Bedrest 1 hour.     Sianne Tejada R. Anselm Pancoast, MD  Pager: [phone]

## 2018-07-20 NOTE — Procedures (Signed)
Interventional Radiology Procedure:   Indications: AV graft occlusion  Procedure: Right arm AV graft declot  Findings: Occluded AV graft with outflow vein stenosis.  AV graft successfully recanalized and outflow vein PTA up to 7 mm.  Complications: None     EBL: Less than 20 ml  Plan: AV graft is ready for dialysis.  Remove sutures at next dialysis session.     Khyre Germond R. Anselm Pancoast, MD  Pager: [phone]

## 2018-07-20 NOTE — Progress Notes (Signed)
D/c instructions reviewed with son, [name], via telephone due to [place] restrictions.  All questions answered and [name] verbalized understanding.

## 2018-07-21 DIAGNOSIS — Z992 Dependence on renal dialysis: Secondary | ICD-10-CM | POA: Diagnosis not present

## 2018-07-21 DIAGNOSIS — D509 Iron deficiency anemia, unspecified: Secondary | ICD-10-CM | POA: Diagnosis not present

## 2018-07-21 DIAGNOSIS — D631 Anemia in chronic kidney disease: Secondary | ICD-10-CM | POA: Diagnosis not present

## 2018-07-21 DIAGNOSIS — N2581 Secondary hyperparathyroidism of renal origin: Secondary | ICD-10-CM | POA: Diagnosis not present

## 2018-07-21 DIAGNOSIS — N186 End stage renal disease: Secondary | ICD-10-CM | POA: Diagnosis not present

## 2018-07-23 DIAGNOSIS — N186 End stage renal disease: Secondary | ICD-10-CM | POA: Diagnosis not present

## 2018-07-23 DIAGNOSIS — D631 Anemia in chronic kidney disease: Secondary | ICD-10-CM | POA: Diagnosis not present

## 2018-07-23 DIAGNOSIS — Z992 Dependence on renal dialysis: Secondary | ICD-10-CM | POA: Diagnosis not present

## 2018-07-23 DIAGNOSIS — N2581 Secondary hyperparathyroidism of renal origin: Secondary | ICD-10-CM | POA: Diagnosis not present

## 2018-07-23 DIAGNOSIS — D509 Iron deficiency anemia, unspecified: Secondary | ICD-10-CM | POA: Diagnosis not present

## 2018-07-24 DIAGNOSIS — N186 End stage renal disease: Secondary | ICD-10-CM | POA: Diagnosis not present

## 2018-07-24 DIAGNOSIS — Z992 Dependence on renal dialysis: Secondary | ICD-10-CM | POA: Diagnosis not present

## 2018-07-25 DIAGNOSIS — D649 Anemia, unspecified: Secondary | ICD-10-CM

## 2018-07-25 HISTORY — DX: Anemia, unspecified: D64.9

## 2018-07-26 DIAGNOSIS — E7849 Other hyperlipidemia: Secondary | ICD-10-CM | POA: Diagnosis not present

## 2018-07-26 DIAGNOSIS — N2581 Secondary hyperparathyroidism of renal origin: Secondary | ICD-10-CM | POA: Diagnosis not present

## 2018-07-26 DIAGNOSIS — D631 Anemia in chronic kidney disease: Secondary | ICD-10-CM | POA: Diagnosis not present

## 2018-07-26 DIAGNOSIS — Z992 Dependence on renal dialysis: Secondary | ICD-10-CM | POA: Diagnosis not present

## 2018-07-26 DIAGNOSIS — F3289 Other specified depressive episodes: Secondary | ICD-10-CM | POA: Diagnosis not present

## 2018-07-26 DIAGNOSIS — I10 Essential (primary) hypertension: Secondary | ICD-10-CM | POA: Diagnosis not present

## 2018-07-26 DIAGNOSIS — I5032 Chronic diastolic (congestive) heart failure: Secondary | ICD-10-CM | POA: Diagnosis not present

## 2018-07-26 DIAGNOSIS — N186 End stage renal disease: Secondary | ICD-10-CM | POA: Diagnosis not present

## 2018-07-28 DIAGNOSIS — Z992 Dependence on renal dialysis: Secondary | ICD-10-CM | POA: Diagnosis not present

## 2018-07-28 DIAGNOSIS — D631 Anemia in chronic kidney disease: Secondary | ICD-10-CM | POA: Diagnosis not present

## 2018-07-28 DIAGNOSIS — N186 End stage renal disease: Secondary | ICD-10-CM | POA: Diagnosis not present

## 2018-07-28 DIAGNOSIS — N2581 Secondary hyperparathyroidism of renal origin: Secondary | ICD-10-CM | POA: Diagnosis not present

## 2018-07-30 ENCOUNTER — Inpatient Hospital Stay (HOSPITAL_COMMUNITY)
Admission: EM | Admit: 2018-07-30 | Discharge: 2018-08-19 | DRG: 377 | Disposition: A | Discharge status: Home Health | Payer: Medicare Other | Attending: Internal Medicine | Admitting: Internal Medicine

## 2018-07-30 ENCOUNTER — Other Ambulatory Visit: Payer: Self-pay

## 2018-07-30 ENCOUNTER — Emergency Department (HOSPITAL_COMMUNITY): Payer: Medicare Other

## 2018-07-30 ENCOUNTER — Inpatient Hospital Stay (HOSPITAL_COMMUNITY): Payer: Medicare Other

## 2018-07-30 DIAGNOSIS — R0600 Dyspnea, unspecified: Secondary | ICD-10-CM

## 2018-07-30 DIAGNOSIS — Z4659 Encounter for fitting and adjustment of other gastrointestinal appliance and device: Secondary | ICD-10-CM | POA: Diagnosis not present

## 2018-07-30 DIAGNOSIS — D631 Anemia in chronic kidney disease: Secondary | ICD-10-CM | POA: Diagnosis present

## 2018-07-30 DIAGNOSIS — I2510 Atherosclerotic heart disease of native coronary artery without angina pectoris: Secondary | ICD-10-CM | POA: Diagnosis present

## 2018-07-30 DIAGNOSIS — Z9889 Other specified postprocedural states: Secondary | ICD-10-CM | POA: Diagnosis not present

## 2018-07-30 DIAGNOSIS — D649 Anemia, unspecified: Secondary | ICD-10-CM | POA: Diagnosis not present

## 2018-07-30 DIAGNOSIS — L8990 Pressure ulcer of unspecified site, unspecified stage: Secondary | ICD-10-CM | POA: Diagnosis present

## 2018-07-30 DIAGNOSIS — I69351 Hemiplegia and hemiparesis following cerebral infarction affecting right dominant side: Secondary | ICD-10-CM

## 2018-07-30 DIAGNOSIS — Z4682 Encounter for fitting and adjustment of non-vascular catheter: Secondary | ICD-10-CM | POA: Diagnosis not present

## 2018-07-30 DIAGNOSIS — E1122 Type 2 diabetes mellitus with diabetic chronic kidney disease: Secondary | ICD-10-CM | POA: Diagnosis present

## 2018-07-30 DIAGNOSIS — K922 Gastrointestinal hemorrhage, unspecified: Secondary | ICD-10-CM | POA: Diagnosis present

## 2018-07-30 DIAGNOSIS — Z681 Body mass index (BMI) 19 or less, adult: Secondary | ICD-10-CM | POA: Diagnosis not present

## 2018-07-30 DIAGNOSIS — R579 Shock, unspecified: Secondary | ICD-10-CM

## 2018-07-30 DIAGNOSIS — Z978 Presence of other specified devices: Secondary | ICD-10-CM | POA: Diagnosis not present

## 2018-07-30 DIAGNOSIS — E8889 Other specified metabolic disorders: Secondary | ICD-10-CM | POA: Diagnosis present

## 2018-07-30 DIAGNOSIS — Z8249 Family history of ischemic heart disease and other diseases of the circulatory system: Secondary | ICD-10-CM

## 2018-07-30 DIAGNOSIS — I252 Old myocardial infarction: Secondary | ICD-10-CM

## 2018-07-30 DIAGNOSIS — K31819 Angiodysplasia of stomach and duodenum without bleeding: Secondary | ICD-10-CM | POA: Diagnosis not present

## 2018-07-30 DIAGNOSIS — Z992 Dependence on renal dialysis: Secondary | ICD-10-CM

## 2018-07-30 DIAGNOSIS — E1165 Type 2 diabetes mellitus with hyperglycemia: Secondary | ICD-10-CM | POA: Diagnosis not present

## 2018-07-30 DIAGNOSIS — K31811 Angiodysplasia of stomach and duodenum with bleeding: Secondary | ICD-10-CM | POA: Diagnosis present

## 2018-07-30 DIAGNOSIS — I959 Hypotension, unspecified: Secondary | ICD-10-CM | POA: Diagnosis present

## 2018-07-30 DIAGNOSIS — Z20828 Contact with and (suspected) exposure to other viral communicable diseases: Secondary | ICD-10-CM | POA: Diagnosis present

## 2018-07-30 DIAGNOSIS — J9601 Acute respiratory failure with hypoxia: Secondary | ICD-10-CM | POA: Diagnosis not present

## 2018-07-30 DIAGNOSIS — Z7189 Other specified counseling: Secondary | ICD-10-CM | POA: Diagnosis not present

## 2018-07-30 DIAGNOSIS — E785 Hyperlipidemia, unspecified: Secondary | ICD-10-CM | POA: Diagnosis present

## 2018-07-30 DIAGNOSIS — J9811 Atelectasis: Secondary | ICD-10-CM | POA: Diagnosis not present

## 2018-07-30 DIAGNOSIS — Z7401 Bed confinement status: Secondary | ICD-10-CM

## 2018-07-30 DIAGNOSIS — I5032 Chronic diastolic (congestive) heart failure: Secondary | ICD-10-CM | POA: Diagnosis present

## 2018-07-30 DIAGNOSIS — N186 End stage renal disease: Secondary | ICD-10-CM | POA: Diagnosis not present

## 2018-07-30 DIAGNOSIS — Z515 Encounter for palliative care: Secondary | ICD-10-CM

## 2018-07-30 DIAGNOSIS — R918 Other nonspecific abnormal finding of lung field: Secondary | ICD-10-CM | POA: Diagnosis not present

## 2018-07-30 DIAGNOSIS — I679 Cerebrovascular disease, unspecified: Secondary | ICD-10-CM | POA: Diagnosis not present

## 2018-07-30 DIAGNOSIS — R188 Other ascites: Secondary | ICD-10-CM | POA: Diagnosis not present

## 2018-07-30 DIAGNOSIS — D696 Thrombocytopenia, unspecified: Secondary | ICD-10-CM | POA: Diagnosis present

## 2018-07-30 DIAGNOSIS — I132 Hypertensive heart and chronic kidney disease with heart failure and with stage 5 chronic kidney disease, or end stage renal disease: Secondary | ICD-10-CM | POA: Diagnosis present

## 2018-07-30 DIAGNOSIS — E43 Unspecified severe protein-calorie malnutrition: Secondary | ICD-10-CM | POA: Insufficient documentation

## 2018-07-30 DIAGNOSIS — E162 Hypoglycemia, unspecified: Secondary | ICD-10-CM | POA: Diagnosis not present

## 2018-07-30 DIAGNOSIS — J95811 Postprocedural pneumothorax: Secondary | ICD-10-CM | POA: Diagnosis not present

## 2018-07-30 DIAGNOSIS — J441 Chronic obstructive pulmonary disease with (acute) exacerbation: Secondary | ICD-10-CM | POA: Diagnosis not present

## 2018-07-30 DIAGNOSIS — K635 Polyp of colon: Secondary | ICD-10-CM | POA: Diagnosis present

## 2018-07-30 DIAGNOSIS — Z430 Encounter for attention to tracheostomy: Secondary | ICD-10-CM | POA: Diagnosis not present

## 2018-07-30 DIAGNOSIS — E11649 Type 2 diabetes mellitus with hypoglycemia without coma: Secondary | ICD-10-CM | POA: Diagnosis not present

## 2018-07-30 DIAGNOSIS — J449 Chronic obstructive pulmonary disease, unspecified: Secondary | ICD-10-CM | POA: Diagnosis present

## 2018-07-30 DIAGNOSIS — R578 Other shock: Secondary | ICD-10-CM | POA: Diagnosis not present

## 2018-07-30 DIAGNOSIS — R64 Cachexia: Secondary | ICD-10-CM | POA: Diagnosis present

## 2018-07-30 DIAGNOSIS — Z79899 Other long term (current) drug therapy: Secondary | ICD-10-CM

## 2018-07-30 DIAGNOSIS — J939 Pneumothorax, unspecified: Secondary | ICD-10-CM | POA: Diagnosis not present

## 2018-07-30 DIAGNOSIS — Z7902 Long term (current) use of antithrombotics/antiplatelets: Secondary | ICD-10-CM

## 2018-07-30 DIAGNOSIS — J9690 Respiratory failure, unspecified, unspecified whether with hypoxia or hypercapnia: Secondary | ICD-10-CM | POA: Diagnosis not present

## 2018-07-30 DIAGNOSIS — R5381 Other malaise: Secondary | ICD-10-CM | POA: Diagnosis present

## 2018-07-30 DIAGNOSIS — R54 Age-related physical debility: Secondary | ICD-10-CM | POA: Diagnosis not present

## 2018-07-30 DIAGNOSIS — I517 Cardiomegaly: Secondary | ICD-10-CM | POA: Diagnosis not present

## 2018-07-30 DIAGNOSIS — Z0189 Encounter for other specified special examinations: Secondary | ICD-10-CM

## 2018-07-30 DIAGNOSIS — K264 Chronic or unspecified duodenal ulcer with hemorrhage: Principal | ICD-10-CM | POA: Diagnosis present

## 2018-07-30 DIAGNOSIS — J9621 Acute and chronic respiratory failure with hypoxia: Secondary | ICD-10-CM | POA: Diagnosis not present

## 2018-07-30 DIAGNOSIS — J948 Other specified pleural conditions: Secondary | ICD-10-CM | POA: Diagnosis not present

## 2018-07-30 DIAGNOSIS — I9589 Other hypotension: Secondary | ICD-10-CM | POA: Diagnosis not present

## 2018-07-30 DIAGNOSIS — N2581 Secondary hyperparathyroidism of renal origin: Secondary | ICD-10-CM | POA: Diagnosis not present

## 2018-07-30 DIAGNOSIS — E1169 Type 2 diabetes mellitus with other specified complication: Secondary | ICD-10-CM | POA: Diagnosis not present

## 2018-07-30 DIAGNOSIS — Z01818 Encounter for other preprocedural examination: Secondary | ICD-10-CM

## 2018-07-30 DIAGNOSIS — Z7982 Long term (current) use of aspirin: Secondary | ICD-10-CM

## 2018-07-30 DIAGNOSIS — J90 Pleural effusion, not elsewhere classified: Secondary | ICD-10-CM | POA: Diagnosis not present

## 2018-07-30 DIAGNOSIS — R195 Other fecal abnormalities: Secondary | ICD-10-CM | POA: Diagnosis not present

## 2018-07-30 DIAGNOSIS — D62 Acute posthemorrhagic anemia: Secondary | ICD-10-CM | POA: Diagnosis not present

## 2018-07-30 DIAGNOSIS — E875 Hyperkalemia: Secondary | ICD-10-CM | POA: Diagnosis present

## 2018-07-30 DIAGNOSIS — E119 Type 2 diabetes mellitus without complications: Secondary | ICD-10-CM

## 2018-07-30 DIAGNOSIS — E861 Hypovolemia: Secondary | ICD-10-CM | POA: Diagnosis not present

## 2018-07-30 DIAGNOSIS — J811 Chronic pulmonary edema: Secondary | ICD-10-CM | POA: Diagnosis not present

## 2018-07-30 DIAGNOSIS — Z452 Encounter for adjustment and management of vascular access device: Secondary | ICD-10-CM | POA: Diagnosis not present

## 2018-07-30 DIAGNOSIS — I509 Heart failure, unspecified: Secondary | ICD-10-CM | POA: Diagnosis not present

## 2018-07-30 DIAGNOSIS — J984 Other disorders of lung: Secondary | ICD-10-CM | POA: Diagnosis not present

## 2018-07-30 DIAGNOSIS — R0902 Hypoxemia: Secondary | ICD-10-CM | POA: Diagnosis not present

## 2018-07-30 DIAGNOSIS — K921 Melena: Secondary | ICD-10-CM | POA: Diagnosis not present

## 2018-07-30 DIAGNOSIS — I120 Hypertensive chronic kidney disease with stage 5 chronic kidney disease or end stage renal disease: Secondary | ICD-10-CM | POA: Diagnosis not present

## 2018-07-30 DIAGNOSIS — Z7901 Long term (current) use of anticoagulants: Secondary | ICD-10-CM

## 2018-07-30 DIAGNOSIS — R069 Unspecified abnormalities of breathing: Secondary | ICD-10-CM

## 2018-07-30 DIAGNOSIS — Z91018 Allergy to other foods: Secondary | ICD-10-CM

## 2018-07-30 LAB — CBC WITH DIFFERENTIAL/PLATELET
Abs Immature Granulocytes: 0.02 10*3/uL (ref 0.00–0.07)
Basophils Absolute: 0 10*3/uL (ref 0.0–0.1)
Basophils Relative: 0 %
Eosinophils Absolute: 0 10*3/uL (ref 0.0–0.5)
Eosinophils Relative: 1 %
HCT: 18.7 % — ABNORMAL LOW (ref 36.0–46.0)
Hemoglobin: 5.7 g/dL — CL (ref 12.0–15.0)
Immature Granulocytes: 0 %
Lymphocytes Relative: 10 %
Lymphs Abs: 0.7 10*3/uL (ref 0.7–4.0)
MCH: 29.7 pg (ref 26.0–34.0)
MCHC: 30.5 g/dL (ref 30.0–36.0)
MCV: 97.4 fL (ref 80.0–100.0)
Monocytes Absolute: 0.6 10*3/uL (ref 0.1–1.0)
Monocytes Relative: 8 %
Neutro Abs: 5.9 10*3/uL (ref 1.7–7.7)
Neutrophils Relative %: 81 %
Platelets: 145 10*3/uL — ABNORMAL LOW (ref 150–400)
RBC: 1.92 MIL/uL — ABNORMAL LOW (ref 3.87–5.11)
RDW: 17.7 % — ABNORMAL HIGH (ref 11.5–15.5)
WBC: 7.3 10*3/uL (ref 4.0–10.5)
nRBC: 0 % (ref 0.0–0.2)

## 2018-07-30 LAB — COMPREHENSIVE METABOLIC PANEL
ALT: 113 U/L — ABNORMAL HIGH (ref 0–44)
AST: 53 U/L — ABNORMAL HIGH (ref 15–41)
Albumin: 2.5 g/dL — ABNORMAL LOW (ref 3.5–5.0)
Alkaline Phosphatase: 64 U/L (ref 38–126)
Anion gap: 14 (ref 5–15)
BUN: 64 mg/dL — ABNORMAL HIGH (ref 8–23)
CO2: 26 mmol/L (ref 22–32)
Calcium: 7.9 mg/dL — ABNORMAL LOW (ref 8.9–10.3)
Chloride: 102 mmol/L (ref 98–111)
Creatinine, Ser: 5.37 mg/dL — ABNORMAL HIGH (ref 0.44–1.00)
GFR calc Af Amer: 8 mL/min — ABNORMAL LOW (ref >60)
GFR calc non Af Amer: 7 mL/min — ABNORMAL LOW (ref >60)
Glucose, Bld: 126 mg/dL — ABNORMAL HIGH (ref 70–99)
Potassium: 3.6 mmol/L (ref 3.5–5.1)
Sodium: 142 mmol/L (ref 135–145)
Total Bilirubin: 0.6 mg/dL (ref 0.3–1.2)
Total Protein: 5.2 g/dL — ABNORMAL LOW (ref 6.5–8.1)

## 2018-07-30 LAB — SARS CORONAVIRUS 2 BY RT PCR (HOSPITAL ORDER, PERFORMED IN ~~LOC~~ HOSPITAL LAB): SARS Coronavirus 2: NEGATIVE

## 2018-07-30 LAB — PREPARE RBC (CROSSMATCH)

## 2018-07-30 LAB — POC OCCULT BLOOD, ED: Fecal Occult Bld: POSITIVE — AB

## 2018-07-30 LAB — ABO/RH: ABO/RH(D): A POS

## 2018-07-30 LAB — HEMOGLOBIN AND HEMATOCRIT, BLOOD
HCT: 24.8 % — ABNORMAL LOW (ref 36.0–46.0)
Hemoglobin: 8.1 g/dL — ABNORMAL LOW (ref 12.0–15.0)

## 2018-07-30 MED ORDER — SODIUM CHLORIDE 0.9 % IV SOLN
10.0000 mL/h | Freq: Once | INTRAVENOUS | Status: AC
  Administered 2018-07-30: 10 mL/h via INTRAVENOUS

## 2018-07-30 MED ORDER — PANTOPRAZOLE SODIUM 40 MG IV SOLR: 40.0000 mg | Freq: Two times a day (BID) | INTRAVENOUS | Status: DC

## 2018-07-30 MED ORDER — LACTATED RINGERS IV BOLUS
500.0000 mL | Freq: Once | INTRAVENOUS | Status: AC
  Administered 2018-07-30: 500 mL via INTRAVENOUS

## 2018-07-30 MED ORDER — PANCRELIPASE (LIP-PROT-AMYL) 6000-19000 UNITS PO CPEP: 1.0000 | ORAL_CAPSULE | Freq: Three times a day (TID) | ORAL | Status: DC

## 2018-07-30 MED ORDER — SODIUM CHLORIDE 0.9 % IV SOLN
8.0000 mg/h | INTRAVENOUS | Status: DC
  Administered 2018-07-30 – 2018-07-31 (×2): 8 mg/h via INTRAVENOUS
  Filled 2018-07-30 (×3): qty 80

## 2018-07-30 MED ORDER — ESCITALOPRAM OXALATE 10 MG PO TABS
5.0000 mg | ORAL_TABLET | Freq: Every day | ORAL | Status: DC
  Administered 2018-07-31 – 2018-08-19 (×18): 5 mg via ORAL
  Filled 2018-07-30: qty 0.5
  Filled 2018-07-30 (×4): qty 1
  Filled 2018-07-30 (×2): qty 0.5
  Filled 2018-07-30: qty 1
  Filled 2018-07-30 (×2): qty 0.5
  Filled 2018-07-30 (×3): qty 1
  Filled 2018-07-30 (×2): qty 0.5
  Filled 2018-07-30 (×2): qty 1
  Filled 2018-07-30: qty 0.5
  Filled 2018-07-30: qty 1
  Filled 2018-07-30: qty 0.5
  Filled 2018-07-30: qty 1

## 2018-07-30 MED ORDER — SODIUM CHLORIDE 0.9 % IV SOLN
80.0000 mg | Freq: Once | INTRAVENOUS | Status: AC
  Administered 2018-07-30: 80 mg via INTRAVENOUS
  Filled 2018-07-30: qty 80

## 2018-07-30 MED ORDER — SODIUM CHLORIDE 0.9% IV SOLUTION: Freq: Once | INTRAVENOUS | Status: DC

## 2018-07-30 NOTE — ED Provider Notes (Signed)
[facility] EMERGENCY DEPARTMENT Provider Note   CSN: [id number] Arrival date & time: 07/30/18  1258    History   Chief Complaint Chief Complaint  Patient presents with  . Hypotension    HPI Courtney [name] is a 78 y.o. female.    Level 5 caveat due to altered mental status. HPI Patient presents with weakness and hypotension.  Reportedly has had less eating and drinking over the last couple days.  Mostly history comes to the patient's son as the patient seems a little confused.  Patient is a dialysis patient and was supposed be dialyzed today but with the hypotension it was not done.  Patient is states she feels weak.  Not on anticoagulation.  No blood in the stool or black stools.  Patient is minimally ambulatory at baseline. Past Medical History:  Diagnosis Date  . CHF (congestive heart failure) ([place])   . Coronary artery disease   . Diabetes mellitus without complication ([place])   . ESRD on dialysis ([place]) 02/10/2017  . Hyperlipidemia   . Hypertension   . Myocardial infarct ([place])   . Stroke [facility])    2-3 yrs ago- right sided weakness    Patient Active Problem List   Diagnosis Date Noted  . Acute CVA (cerebrovascular accident) ([place]) 07/13/2017  . Hypokalemia 07/13/2017  . Anemia in ESRD (end-stage renal disease) ([place]) 07/13/2017  . Hyperlipidemia 07/13/2017  . Coronary artery disease 07/13/2017  . Type 2 diabetes mellitus ([place]) 07/13/2017  . ESRD on dialysis ([place]) 02/10/2017  . Intertrochanteric fracture of right hip ([place]) 08/16/2012  . Syncope 07/17/2012  . Closed right hip fracture ([place]) 07/17/2012  . HTN (hypertension) 07/17/2012  . DM (diabetes mellitus) ([place]) 07/17/2012  . Cerebrovascular disease 07/17/2012    Past Surgical History:  Procedure Laterality Date  . APPENDECTOMY    . AV FISTULA PLACEMENT Right 09/20/2015   Procedure: PLACEMENT OF RIGHT UPPER EXTREMITY ARTERIOVENOUS GORE-TEX GRAFT FOR HEMODIALYSIS ACCESS;  Surgeon: Vickie Epley, MD;   Location: [facility] ORS;  Service: Vascular;  Laterality: Right;  . CATARACT EXTRACTION W/PHACO Left 06/29/2013   Procedure: CATARACT EXTRACTION PHACO AND INTRAOCULAR LENS PLACEMENT (IOC);  Surgeon: Tonny Branch, MD;  Location: [facility] ORS;  Service: Ophthalmology;  Laterality: Left;  CDE 12.25  . CATARACT EXTRACTION W/PHACO Right 07/27/2013   Procedure: CATARACT EXTRACTION PHACO AND INTRAOCULAR LENS PLACEMENT (IOC);  Surgeon: Tonny Branch, MD;  Location: [facility] ORS;  Service: Ophthalmology;  Laterality: Right;  CDE:7.72  . INSERTION OF DIALYSIS CATHETER Right 09/11/2015   Procedure: INSERTION OF TUNNELED DIALYSIS CATHETER;  Surgeon: Vickie Epley, MD;  Location: [facility] ORS;  Service: Vascular;  Laterality: Right;  . INTRAMEDULLARY (IM) NAIL INTERTROCHANTERIC Right 07/20/2012   Procedure: INTRAMEDULLARY (IM) NAIL INTERTROCHANTRIC;  Surgeon: Carole Civil, MD;  Location: [facility] ORS;  Service: Orthopedics;  Laterality: Right;  . IR AV DIALY SHUNT INTRO NEEDLE/INTRAC INITIAL W/PTA/STENT/IMG RT Right 07/20/2018  . IR GENERIC HISTORICAL  09/25/2015   IR US GUIDE VASC ACCESS RIGHT 09/25/2015 Sandi Mariscal, MD [facility]  . IR GENERIC HISTORICAL  09/25/2015   IR FLUORO GUIDE CV LINE RIGHT 09/25/2015 Sandi Mariscal, MD [facility]  . IR GENERIC HISTORICAL  09/25/2015   IR REMOVAL TUN CV CATH W/O FL 09/25/2015 Sandi Mariscal, MD [facility]  . IR THROMBECTOMY AV FISTULA W/THROMBOLYSIS/PTA INC/SHUNT/IMG RIGHT Right 09/06/2017  . IR THROMBECTOMY AV FISTULA W/THROMBOLYSIS/PTA INC/SHUNT/IMG RIGHT Right 09/29/2017  . IR THROMBECTOMY AV FISTULA W/THROMBOLYSIS/PTA INC/SHUNT/IMG RIGHT Right 07/20/2018  . IR  US GUIDE VASC ACCESS RIGHT  09/06/2017  . IR US GUIDE VASC ACCESS RIGHT  09/29/2017  . IR US GUIDE VASC ACCESS RIGHT  07/20/2018  . IR US GUIDE VASC ACCESS RIGHT  07/20/2018     OB History   No obstetric history on file.      Home Medications    Prior to Admission medications   Medication Sig Start Date End Date Taking? Authorizing Provider   aspirin EC 81 MG tablet Take 81 mg by mouth daily.    [provider]  atorvastatin (LIPITOR) 20 MG tablet Take 20 mg by mouth daily.    [provider]  calcium-vitamin D (OSCAL WITH D) 500-200 MG-UNIT tablet Take 1 tablet by mouth.    [provider]  cholecalciferol (VITAMIN D) 400 units TABS tablet Take 1,000 Units by mouth daily.     [provider]  escitalopram (LEXAPRO) 5 MG tablet Take 5 mg by mouth daily.    [provider]  furosemide (LASIX) 20 MG tablet Take 20 mg by mouth daily.     [provider]  hydrALAZINE (APRESOLINE) 25 MG tablet Take 25 mg by mouth 3 (three) times daily. 07/22/12   Kathie Dike, MD  isosorbide mononitrate (IMDUR) 30 MG 24 hr tablet Take 30 mg by mouth daily.    [provider]  K Phos Mono-Sod Phos Di & Mono (PHOSPHA 250 NEUTRAL) [phone] MG TABS Take 1 tablet by mouth daily.     [provider]  loperamide (IMODIUM A-D) 2 MG tablet Take 2 mg by mouth every 6 (six) hours as needed for diarrhea or loose stools.    [provider]  omeprazole (PRILOSEC) 40 MG capsule Take 1 capsule (40 mg total) by mouth daily. 02/10/17   Setzer, Rona Ravens, NP  oxyCODONE-acetaminophen (ROXICET) 5-325 MG tablet Take 1 tablet by mouth every 6 (six) hours as needed for severe pain. 09/20/15   Vickie Epley, MD    Family History Family History  Problem Relation Age of Onset  . Heart disease Mother   . Heart disease Father     Social History Social History   Tobacco Use  . Smoking status: Never Smoker  . Smokeless tobacco: Never Used  Substance Use Topics  . Alcohol use: No  . Drug use: No     Allergies   Grapefruit flavor [flavoring agent]   Review of Systems Review of Systems  Unable to perform ROS: Mental status change     Physical Exam Updated Vital Signs BP (!) 83/34   Pulse 73   Temp 99 F (37.2 C) (Rectal)   Resp (!) 22   SpO2 100%   Physical Exam Vitals  signs and nursing note reviewed.  HENT:     Head: Normocephalic.  Eyes:     General: No scleral icterus. Neck:     Musculoskeletal: Neck supple.  Cardiovascular:     Rate and Rhythm: Regular rhythm.  Pulmonary:     Breath sounds: No wheezing.  Abdominal:     Tenderness: There is no abdominal tenderness.  Genitourinary:    Rectum: Guaiac result positive.     Comments: Minimal stool in vault.  Mucus may be a little red but not clearly blood. Musculoskeletal:     Right lower leg: No edema.     Left lower leg: No edema.  Skin:    General: Skin is warm.     Capillary Refill: Capillary refill takes less than 2 seconds.  Neurological:     Mental Status: She is alert.     Comments: Sitting in bed.  Will answer some questions but appears somewhat confused       ED Treatments / Results  Labs (all labs ordered are listed, but only abnormal results are displayed) Labs Reviewed  COMPREHENSIVE METABOLIC PANEL - Abnormal; Notable for the following components:      Result Value   Glucose, Bld 126 (*)    BUN 64 (*)    Creatinine, Ser 5.37 (*)    Calcium 7.9 (*)    Total Protein 5.2 (*)    Albumin 2.5 (*)    AST 53 (*)    ALT 113 (*)    GFR calc non Af Amer 7 (*)    GFR calc Af Amer 8 (*)    All other components within normal limits  CBC WITH DIFFERENTIAL/PLATELET - Abnormal; Notable for the following components:   RBC 1.92 (*)    Hemoglobin 5.7 (*)    HCT 18.7 (*)    RDW 17.7 (*)    Platelets 145 (*)    All other components within normal limits  POC OCCULT BLOOD, ED - Abnormal; Notable for the following components:   Fecal Occult Bld POSITIVE (*)    All other components within normal limits  URINALYSIS, ROUTINE W REFLEX MICROSCOPIC  TYPE AND SCREEN  PREPARE RBC (CROSSMATCH)    EKG EKG Interpretation  Date/Time:  Saturday July 30 2018 13:14:45 EDT Ventricular Rate:  67 PR Interval:    QRS Duration: 119 QT Interval:  472 QTC Calculation: 499 R Axis:   43 Text  Interpretation:  Sinus rhythm Incomplete right bundle branch block Confirmed by Davonna Belling [phone]) on 07/30/2018 1:27:50 PM   Radiology No results found.  Procedures Procedures (including critical care time)  Medications Ordered in ED Medications  0.9 %  sodium chloride infusion (has no administration in time range)  lactated ringers bolus 500 mL (500 mLs Intravenous New Bag/Given 07/30/18 1429)     Initial Impression / Assessment and Plan / ED Course  I have reviewed the triage vital signs and the nursing notes.  Pertinent labs & imaging results that were available during my care of the patient were reviewed by me and considered in my medical decision making (see chart for details).        Patient with end-stage renal disease.  On dialysis.  Hypotensive.  Found to have a hemoglobin of 5.7.  Grossly guaiac positive but was not necessarily red.  Has had polyps in her colon previously.  However will start Protonix since it is not clearly a lower GI bleed.  Will transfuse blood  Will admit to internal medicine.  CRITICAL CARE Performed by: Davonna Belling Total critical care time: 30 minutes Critical care time was exclusive of separately billable procedures and treating other patients. Critical care was necessary to treat or prevent imminent or life-threatening deterioration. Critical care was time spent personally by me on the following activities: development of treatment plan with patient and/or surrogate as well as nursing, discussions with consultants, evaluation of patient's response to treatment, examination of patient, obtaining history from patient or surrogate, ordering and performing treatments and interventions, ordering and review of laboratory studies, ordering and review of radiographic studies, pulse oximetry and re-evaluation of patient's condition.   Final Clinical Impressions(s) / ED Diagnoses   Final diagnoses:  Gastrointestinal hemorrhage, unspecified  gastrointestinal hemorrhage type  End stage renal disease on dialysis ([place])  Acute blood  loss anemia    ED Discharge Orders    None       Davonna Belling, MD 07/30/18 1528

## 2018-07-30 NOTE — ED Notes (Signed)
BP taken in left upper arm and it is 95/44, pt is alert.

## 2018-07-30 NOTE — ED Notes (Signed)
ED TO INPATIENT HANDOFF REPORT  ED Nurse Name and Phone #: [phone]  S Name/Age/Gender Courtney [name] 78 y.o. female Room/Bed: 036C/036C  Code Status   Code Status: Prior  Home/SNF/Other Home Patient oriented to: self Is this baseline? Yes   Triage Complete: Triage complete  Chief Complaint hypotension  Triage Note Patient was at dialysis today and they said that her blood pressure was low so they did not complete treatment. She gets treatment T/Th/Sat. Patient son's reports that his mother seems weak today.    Allergies Allergies  Allergen Reactions  . Grapefruit Flavor [Flavoring Agent]     Reaction unknown    Level of Care/Admitting Diagnosis ED Disposition    ED Disposition Condition [facility]: [place] [[id number]]  Level of Care: Progressive [102]  Covid Evaluation: N/A  Diagnosis: Symptomatic anemia [[id number]]  Admitting Physician: Hosie Poisson [[id number]]  Attending Physician: Hosie Poisson [[id number]]  Estimated length of stay: past midnight tomorrow  Certification:: I certify this patient will need inpatient services for at least 2 midnights  PT Class (Do Not Modify): Inpatient [101]  PT Acc Code (Do Not Modify): Private [1]       B Medical/Surgery History Past Medical History:  Diagnosis Date  . CHF (congestive heart failure) ([place])   . Coronary artery disease   . Diabetes mellitus without complication ([place])   . ESRD on dialysis ([place]) 02/10/2017  . Hyperlipidemia   . Hypertension   . Myocardial infarct ([place])   . Stroke [facility])    2-3 yrs ago- right sided weakness   Past Surgical History:  Procedure Laterality Date  . APPENDECTOMY    . AV FISTULA PLACEMENT Right 09/20/2015   Procedure: PLACEMENT OF RIGHT UPPER EXTREMITY ARTERIOVENOUS GORE-TEX GRAFT FOR HEMODIALYSIS ACCESS;  Surgeon: Vickie Epley, MD;  Location: [facility] ORS;  Service: Vascular;  Laterality: Right;  . CATARACT EXTRACTION W/PHACO Left 06/29/2013   Procedure:  CATARACT EXTRACTION PHACO AND INTRAOCULAR LENS PLACEMENT (IOC);  Surgeon: Tonny Branch, MD;  Location: [facility] ORS;  Service: Ophthalmology;  Laterality: Left;  CDE 12.25  . CATARACT EXTRACTION W/PHACO Right 07/27/2013   Procedure: CATARACT EXTRACTION PHACO AND INTRAOCULAR LENS PLACEMENT (IOC);  Surgeon: Tonny Branch, MD;  Location: [facility] ORS;  Service: Ophthalmology;  Laterality: Right;  CDE:7.72  . INSERTION OF DIALYSIS CATHETER Right 09/11/2015   Procedure: INSERTION OF TUNNELED DIALYSIS CATHETER;  Surgeon: Vickie Epley, MD;  Location: [facility] ORS;  Service: Vascular;  Laterality: Right;  . INTRAMEDULLARY (IM) NAIL INTERTROCHANTERIC Right 07/20/2012   Procedure: INTRAMEDULLARY (IM) NAIL INTERTROCHANTRIC;  Surgeon: Carole Civil, MD;  Location: [facility] ORS;  Service: Orthopedics;  Laterality: Right;  . IR AV DIALY SHUNT INTRO NEEDLE/INTRAC INITIAL W/PTA/STENT/IMG RT Right 07/20/2018  . IR GENERIC HISTORICAL  09/25/2015   IR US GUIDE VASC ACCESS RIGHT 09/25/2015 Sandi Mariscal, MD [facility]  . IR GENERIC HISTORICAL  09/25/2015   IR FLUORO GUIDE CV LINE RIGHT 09/25/2015 Sandi Mariscal, MD [facility]  . IR GENERIC HISTORICAL  09/25/2015   IR REMOVAL TUN CV CATH W/O FL 09/25/2015 Sandi Mariscal, MD [facility]  . IR THROMBECTOMY AV FISTULA W/THROMBOLYSIS/PTA INC/SHUNT/IMG RIGHT Right 09/06/2017  . IR THROMBECTOMY AV FISTULA W/THROMBOLYSIS/PTA INC/SHUNT/IMG RIGHT Right 09/29/2017  . IR THROMBECTOMY AV FISTULA W/THROMBOLYSIS/PTA INC/SHUNT/IMG RIGHT Right 07/20/2018  . IR US GUIDE VASC ACCESS RIGHT  09/06/2017  . IR US GUIDE VASC ACCESS RIGHT  09/29/2017  . IR US GUIDE VASC ACCESS RIGHT  07/20/2018  .  IR US GUIDE VASC ACCESS RIGHT  07/20/2018     A IV Location/Drains/Wounds Patient Lines/Drains/Airways Status   Active Line/Drains/Airways    Name:   Placement date:   Placement time:   Site:   Days:   Peripheral IV 07/13/17 Left;Medial Forearm   07/13/17    1728    Forearm   382   Peripheral IV 07/30/18 Left;Upper;Lateral;Posterior  Forearm   07/30/18    1409    Forearm   less than 1   Fistula / Graft Right Upper arm Arteriovenous vein graft   -    -    Upper arm      Hemodialysis Catheter Right Internal jugular Double-lumen;Permanent   09/25/15    1312    Internal jugular   1039   External Urinary Catheter   09/08/17    1433    -   325   Incision 07/20/12 Hip Right   07/20/12    1502     2201   Incision (Closed) 06/29/13 Eye Left   06/29/13    0840     1857   Incision (Closed) 07/27/13 Eye Right   07/27/13    0820     1829   Incision (Closed) 09/11/15 Chest Right   09/11/15    1114     1053   Incision (Closed) 09/20/15 Arm Right   09/20/15    1446     1044          Intake/Output Last 24 hours  Intake/Output Summary (Last 24 hours) at 07/30/2018 1652 Last data filed at 07/30/2018 1621 Gross per 24 hour  Intake 765 ml  Output -  Net 765 ml    Labs/Imaging Results for orders placed or performed during the hospital encounter of 07/30/18 (from the past 48 hour(s))  Comprehensive metabolic panel     Status: Abnormal   Collection Time: 07/30/18  2:06 PM  Result Value Ref Range   Sodium 142 135 - 145 mmol/L   Potassium 3.6 3.5 - 5.1 mmol/L   Chloride 102 98 - 111 mmol/L   CO2 26 22 - 32 mmol/L   Glucose, Bld 126 (H) 70 - 99 mg/dL   BUN 64 (H) 8 - 23 mg/dL   Creatinine, Ser 5.37 (H) 0.44 - 1.00 mg/dL   Calcium 7.9 (L) 8.9 - 10.3 mg/dL   Total Protein 5.2 (L) 6.5 - 8.1 g/dL   Albumin 2.5 (L) 3.5 - 5.0 g/dL   AST 53 (H) 15 - 41 U/L   ALT 113 (H) 0 - 44 U/L   Alkaline Phosphatase 64 38 - 126 U/L   Total Bilirubin 0.6 0.3 - 1.2 mg/dL   GFR calc non Af Amer 7 (L) >60 mL/min   GFR calc Af Amer 8 (L) >60 mL/min   Anion gap 14 5 - 15    Comment: Performed at [facility], [address], [place], [place] [address]  CBC with Differential     Status: Abnormal   Collection Time: 07/30/18  2:06 PM  Result Value Ref Range   WBC 7.3 4.0 - 10.5 K/uL   RBC 1.92 (L) 3.87 - 5.11 MIL/uL   Hemoglobin 5.7 (LL) 12.0 -  15.0 g/dL    Comment: REPEATED TO VERIFY THIS CRITICAL RESULT HAS VERIFIED AND BEEN CALLED TO DJimmye Norman, RN BY KAY WOOLLEN ON 06 06 2020 AT 1430, AND HAS BEEN READ BACK.     HCT 18.7 (L) 36.0 - 46.0 %  MCV 97.4 80.0 - 100.0 fL   MCH 29.7 26.0 - 34.0 pg   MCHC 30.5 30.0 - 36.0 g/dL   RDW 17.7 (H) 11.5 - 15.5 %   Platelets 145 (L) 150 - 400 K/uL   nRBC 0.0 0.0 - 0.2 %   Neutrophils Relative % 81 %   Neutro Abs 5.9 1.7 - 7.7 K/uL   Lymphocytes Relative 10 %   Lymphs Abs 0.7 0.7 - 4.0 K/uL   Monocytes Relative 8 %   Monocytes Absolute 0.6 0.1 - 1.0 K/uL   Eosinophils Relative 1 %   Eosinophils Absolute 0.0 0.0 - 0.5 K/uL   Basophils Relative 0 %   Basophils Absolute 0.0 0.0 - 0.1 K/uL   Immature Granulocytes 0 %   Abs Immature Granulocytes 0.02 0.00 - 0.07 K/uL    Comment: Performed at [facility] [address] [place], [place] [address]  Type and screen [place]     Status: None (Preliminary result)   Collection Time: 07/30/18  2:50 PM  Result Value Ref Range   ABO/RH(D) A POS    Antibody Screen NEG    Sample Expiration 08/02/2018,2359    Unit Number [id number]    Blood Component Type RED CELLS,LR    Unit division 00    Status of Unit ISSUED    Transfusion Status OK TO TRANSFUSE    Crossmatch Result Compatible    Unit Number [id number]    Blood Component Type RED CELLS,LR    Unit division 00    Status of Unit ALLOCATED    Transfusion Status OK TO TRANSFUSE    Crossmatch Result      Compatible Performed at [facility], [place] [address] [place], [place] [address]    Unit Number [id number]    Blood Component Type RED CELLS,LR    Unit division 00    Status of Unit ALLOCATED    Transfusion Status OK TO TRANSFUSE    Crossmatch Result Compatible   ABO/Rh     Status: None (Preliminary result)   Collection Time: 07/30/18  2:50 PM  Result Value Ref Range   ABO/RH(D)      A POS Performed at [facility], [place] [address], [place], [place] [address]   POC occult blood, ED     Status: Abnormal   Collection Time: 07/30/18  3:11 PM  Result Value Ref Range   Fecal Occult Bld POSITIVE (A) NEGATIVE  Prepare RBC     Status: None   Collection Time: 07/30/18  3:30 PM  Result Value Ref Range   Order Confirmation      ORDER PROCESSED BY BLOOD BANK Performed at [facility], [place] [address], [place], [place] [address]   Prepare RBC     Status: None   Collection Time: 07/30/18  4:37 PM  Result Value Ref Range   Order Confirmation      ORDER PROCESSED BY BLOOD BANK Performed at [facility], [place] [address] [place], [place] [address]    Dg Chest Portable 1 View  Result Date: 07/30/2018 CLINICAL DATA:  Hypotension. CHF. Diabetes. End-stage renal disease. EXAM: PORTABLE CHEST 1 VIEW COMPARISON:  09/08/2017 FINDINGS: Numerous leads and wires project over the chest. Right axillary vascular stent. Moderate cardiomegaly. Atherosclerosis in the transverse aorta. Increase in moderate left pleural effusion. Small right pleural effusion, increased. Congestive heart failure is moderate and increased. Worse on the left. Left greater than right base  airspace disease, increased. Skin fold over the left hemithorax. IMPRESSION: Worsened aeration with progressive congestive heart failure and left greater than right pleural effusions. Left greater than right base airspace disease could represent atelectasis. Concurrent infection cannot be excluded. Aortic Atherosclerosis (ICD10-I70.0). Electronically Signed   By: Abigail Miyamoto M.D.   On: 07/30/2018 16:14    Pending Labs Unresulted Labs (From admission, onward)    Start     Ordered   07/30/18 1355  Urinalysis, Routine w reflex microscopic  ONCE - STAT,   STAT     07/30/18 1354          Vitals/Pain Today's Vitals   07/30/18 1615 07/30/18 1621 07/30/18 1635 07/30/18 1636  BP: (!) 81/26 (!) 76/29 (!) 86/22 (!) 86/22  Pulse: 66 65 68 68  Resp: 17 14 18 18   Temp:  98 F  (36.7 C) 98.1 F (36.7 C) 98.1 F (36.7 C)  TempSrc:  Oral Oral Oral  SpO2: 99%  98% 98%  PainSc:        Isolation Precautions No active isolations  Medications Medications  0.9 %  sodium chloride infusion (has no administration in time range)  pantoprazole (PROTONIX) 80 mg in sodium chloride 0.9 % 100 mL IVPB (has no administration in time range)  pantoprazole (PROTONIX) 80 mg in sodium chloride 0.9 % 250 mL (0.32 mg/mL) infusion (has no administration in time range)  pantoprazole (PROTONIX) injection 40 mg (has no administration in time range)  0.9 %  sodium chloride infusion (Manually program via Guardrails IV Fluids) (has no administration in time range)  lactated ringers bolus 500 mL (0 mLs Intravenous Stopped 07/30/18 1557)    Mobility power wheelchair High fall risk   Focused Assessments     R Recommendations: See Admitting Provider Note  Report given to:   Additional Notes:

## 2018-07-30 NOTE — ED Triage Notes (Signed)
Patient was at dialysis today and they said that her blood pressure was low so they did not complete treatment. She gets treatment T/Th/Sat. Patient son's reports that his mother seems weak today.

## 2018-07-30 NOTE — ED Notes (Signed)
Attempted to call back the dialysis center. Olivia Mackie called earlier from [facility]. Call back number is [phone].

## 2018-07-30 NOTE — Consult Note (Signed)
Reason for Consult: To manage dialysis and dialysis related needs Referring Physician: [name] is an 78 y.o. female with DM, HTN, CAD and also history of CVA and maybe mild dementia.  She also has ESRD-  At [facility] on TTS- lives at home with family- requires assistance with ADLS.  Son is here with her- thinks she handles HD pretty well.  Was in her usual state of health until this AM- seemed weak and "out of it"  She was taken to HD but treatment not done- sent her here.  Found to have hgb of 5.7- getting blood- no history of overt bleed- do not have info from kidney center as we usually watch hgb pretty closely    Dialyzes at [place]  dont know other info  Access right upper AVF.  Past Medical History:  Diagnosis Date  . CHF (congestive heart failure) ([place])   . Coronary artery disease   . Diabetes mellitus without complication ([place])   . ESRD on dialysis ([place]) 02/10/2017  . Hyperlipidemia   . Hypertension   . Myocardial infarct ([place])   . Stroke [facility])    2-3 yrs ago- right sided weakness    Past Surgical History:  Procedure Laterality Date  . APPENDECTOMY    . AV FISTULA PLACEMENT Right 09/20/2015   Procedure: PLACEMENT OF RIGHT UPPER EXTREMITY ARTERIOVENOUS GORE-TEX GRAFT FOR HEMODIALYSIS ACCESS;  Surgeon: Vickie Epley, MD;  Location: [facility];  Service: Vascular;  Laterality: Right;  . CATARACT EXTRACTION W/PHACO Left 06/29/2013   Procedure: CATARACT EXTRACTION PHACO AND INTRAOCULAR LENS PLACEMENT (IOC);  Surgeon: Tonny Branch, MD;  Location: [facility];  Service: Ophthalmology;  Laterality: Left;  CDE 12.25  . CATARACT EXTRACTION W/PHACO Right 07/27/2013   Procedure: CATARACT EXTRACTION PHACO AND INTRAOCULAR LENS PLACEMENT (IOC);  Surgeon: Tonny Branch, MD;  Location: [facility];  Service: Ophthalmology;  Laterality: Right;  CDE:7.72  . INSERTION OF DIALYSIS CATHETER Right 09/11/2015   Procedure: INSERTION OF TUNNELED DIALYSIS CATHETER;  Surgeon: Vickie Epley, MD;   Location: [facility];  Service: Vascular;  Laterality: Right;  . INTRAMEDULLARY (IM) NAIL INTERTROCHANTERIC Right 07/20/2012   Procedure: INTRAMEDULLARY (IM) NAIL INTERTROCHANTRIC;  Surgeon: Carole Civil, MD;  Location: [facility];  Service: Orthopedics;  Laterality: Right;  . IR AV DIALY SHUNT INTRO NEEDLE/INTRAC INITIAL W/PTA/STENT/IMG RT Right 07/20/2018  . IR GENERIC HISTORICAL  09/25/2015   IR US GUIDE VASC ACCESS RIGHT 09/25/2015 Sandi Mariscal, MD [facility]  . IR GENERIC HISTORICAL  09/25/2015   IR FLUORO GUIDE CV LINE RIGHT 09/25/2015 Sandi Mariscal, MD [facility]  . IR GENERIC HISTORICAL  09/25/2015   IR REMOVAL TUN CV CATH W/O FL 09/25/2015 Sandi Mariscal, MD [facility]  . IR THROMBECTOMY AV FISTULA W/THROMBOLYSIS/PTA INC/SHUNT/IMG RIGHT Right 09/06/2017  . IR THROMBECTOMY AV FISTULA W/THROMBOLYSIS/PTA INC/SHUNT/IMG RIGHT Right 09/29/2017  . IR THROMBECTOMY AV FISTULA W/THROMBOLYSIS/PTA INC/SHUNT/IMG RIGHT Right 07/20/2018  . IR US GUIDE VASC ACCESS RIGHT  09/06/2017  . IR US GUIDE VASC ACCESS RIGHT  09/29/2017  . IR US GUIDE VASC ACCESS RIGHT  07/20/2018  . IR US GUIDE VASC ACCESS RIGHT  07/20/2018    Family History  Problem Relation Age of Onset  . Heart disease Mother   . Heart disease Father     Social History:  reports that she has never smoked. She has never used smokeless tobacco. She reports that she does not drink alcohol or use drugs.  Allergies:  Allergies  Allergen Reactions  . Grapefruit Flavor [Flavoring Agent]     Reaction unknown    Medications: I have reviewed the patient's current medications.   Results for orders placed or performed during the hospital encounter of 07/30/18 (from the past 48 hour(s))  Comprehensive metabolic panel     Status: Abnormal   Collection Time: 07/30/18  2:06 PM  Result Value Ref Range   Sodium 142 135 - 145 mmol/L   Potassium 3.6 3.5 - 5.1 mmol/L   Chloride 102 98 - 111 mmol/L   CO2 26 22 - 32 mmol/L   Glucose, Bld 126 (H) 70 - 99 mg/dL   BUN  64 (H) 8 - 23 mg/dL   Creatinine, Ser 5.37 (H) 0.44 - 1.00 mg/dL   Calcium 7.9 (L) 8.9 - 10.3 mg/dL   Total Protein 5.2 (L) 6.5 - 8.1 g/dL   Albumin 2.5 (L) 3.5 - 5.0 g/dL   AST 53 (H) 15 - 41 U/L   ALT 113 (H) 0 - 44 U/L   Alkaline Phosphatase 64 38 - 126 U/L   Total Bilirubin 0.6 0.3 - 1.2 mg/dL   GFR calc non Af Amer 7 (L) >60 mL/min   GFR calc Af Amer 8 (L) >60 mL/min   Anion gap 14 5 - 15    Comment: Performed at [facility], [address], [place], [place] [address]  CBC with Differential     Status: Abnormal   Collection Time: 07/30/18  2:06 PM  Result Value Ref Range   WBC 7.3 4.0 - 10.5 K/uL   RBC 1.92 (L) 3.87 - 5.11 MIL/uL   Hemoglobin 5.7 (LL) 12.0 - 15.0 g/dL    Comment: REPEATED TO VERIFY THIS CRITICAL RESULT HAS VERIFIED AND BEEN CALLED TO DJimmye Norman, RN BY KAY WOOLLEN ON 06 06 2020 AT 1430, AND HAS BEEN READ BACK.     HCT 18.7 (L) 36.0 - 46.0 %   MCV 97.4 80.0 - 100.0 fL   MCH 29.7 26.0 - 34.0 pg   MCHC 30.5 30.0 - 36.0 g/dL   RDW 17.7 (H) 11.5 - 15.5 %   Platelets 145 (L) 150 - 400 K/uL   nRBC 0.0 0.0 - 0.2 %   Neutrophils Relative % 81 %   Neutro Abs 5.9 1.7 - 7.7 K/uL   Lymphocytes Relative 10 %   Lymphs Abs 0.7 0.7 - 4.0 K/uL   Monocytes Relative 8 %   Monocytes Absolute 0.6 0.1 - 1.0 K/uL   Eosinophils Relative 1 %   Eosinophils Absolute 0.0 0.0 - 0.5 K/uL   Basophils Relative 0 %   Basophils Absolute 0.0 0.0 - 0.1 K/uL   Immature Granulocytes 0 %   Abs Immature Granulocytes 0.02 0.00 - 0.07 K/uL    Comment: Performed at [facility] [address] [place], [place] [address]  Type and screen [place]     Status: None (Preliminary result)   Collection Time: 07/30/18  2:50 PM  Result Value Ref Range   ABO/RH(D) A POS    Antibody Screen NEG    Sample Expiration 08/02/2018,2359    Unit Number [id number]    Blood Component Type RED CELLS,LR    Unit division 00    Status of Unit ISSUED    Transfusion Status OK  TO TRANSFUSE    Crossmatch Result Compatible    Unit Number [id number]    Blood Component Type RED CELLS,LR    Unit division 00  Status of Unit ALLOCATED    Transfusion Status OK TO TRANSFUSE    Crossmatch Result      Compatible Performed at [facility], [place] [address], [place], [place] [address]    Unit Number [id number]    Blood Component Type RED CELLS,LR    Unit division 00    Status of Unit ALLOCATED    Transfusion Status OK TO TRANSFUSE    Crossmatch Result Compatible   ABO/Rh     Status: None (Preliminary result)   Collection Time: 07/30/18  2:50 PM  Result Value Ref Range   ABO/RH(D)      A POS Performed at [facility], [place] [address] [place], [place] [address]   POC occult blood, ED     Status: Abnormal   Collection Time: 07/30/18  3:11 PM  Result Value Ref Range   Fecal Occult Bld POSITIVE (A) NEGATIVE  Prepare RBC     Status: None   Collection Time: 07/30/18  3:30 PM  Result Value Ref Range   Order Confirmation      ORDER PROCESSED BY BLOOD BANK Performed at [facility], [place] [address], [place], [place] [address]   Prepare RBC     Status: None   Collection Time: 07/30/18  4:37 PM  Result Value Ref Range   Order Confirmation      ORDER PROCESSED BY BLOOD BANK Performed at [facility], [place] [address] [place], [place] [address]     Dg Chest Portable 1 View  Result Date: 07/30/2018 CLINICAL DATA:  Hypotension. CHF. Diabetes. End-stage renal disease. EXAM: PORTABLE CHEST 1 VIEW COMPARISON:  09/08/2017 FINDINGS: Numerous leads and wires project over the chest. Right axillary vascular stent. Moderate cardiomegaly. Atherosclerosis in the transverse aorta. Increase in moderate left pleural effusion. Small right pleural effusion, increased. Congestive heart failure is moderate and increased. Worse on the left. Left greater than right base airspace disease, increased. Skin fold over the left hemithorax. IMPRESSION: Worsened aeration  with progressive congestive heart failure and left greater than right pleural effusions. Left greater than right base airspace disease could represent atelectasis. Concurrent infection cannot be excluded. Aortic Atherosclerosis (ICD10-I70.0). Electronically Signed   By: Abigail Miyamoto M.D.   On: 07/30/2018 16:14    ROS: some dementia- says butt hurts but then falls asleep- son only says she is weak and out of it  Blood pressure (!) 95/44, pulse 68, temperature 98.1 F (36.7 C), temperature source Oral, resp. rate 18, SpO2 97 %. General appearance: appears stated age, distracted and fatigued Eyes: conjunctivae/corneas clear. PERRL, EOM's intact. Fundi benign. Neck: no adenopathy, no carotid bruit, no JVD, supple, symmetrical, trachea midline and thyroid not enlarged, symmetric, no tenderness/mass/nodules Resp: clear to auscultation bilaterally Cardio: regular rate and rhythm, S1, S2 normal, no murmur, click, rub or gallop GI: soft, non-tender; bowel sounds normal; no masses,  no organomegaly Extremities: extremities normal, atraumatic, no cyanosis or edema right upper AVF  Assessment/Plan: 78 year old BF with multiple medical problems including ESRD.  Presents with weakness found to have hgb of 5.7 1 hgb of 5.7- on 5/27 was 8.5 when she came for access procedure.  Son denies overt bleeding.  Do not have recent information from the kidney center about iron or ESA dosing.  Getting blood right now- 2 units.  I will likely dose with ESA while here when gets hd next  2 ESRD: normally TTS at [facility] via [facility].  Last HD was  Thursday.  No acute need right now and no staff to run.  Will eval in AM- usually only run HD on Sunday for emergencies.  May not get HD til Monday if does not need  3 Hypertension: BP is low- looks like at home is on hydralazine and lasix- being held.  Her volume looks fine right now, she tells me that she makes urine   5. Metabolic Bone Disease: no information and no labs- no bone  active meds on home med list    Courtney [name] 07/30/2018, 5:51 PM

## 2018-07-30 NOTE — H&P (Signed)
History and Physical    Courtney [name] OFB:[id number] DOB: [birth year] DOA: 07/30/2018  PCP: Neale Burly, MD  Patient coming from:  Home.   I have personally briefly reviewed patient's old medical records in [place]  Chief Complaint: sent from [place] for hypotension.   HPI: Courtney [name] is a 78 y.o. female with medical history significant of ESRD ON HD, TThsat, diastolic heart failure, stroke, CAD , DM, hypertension, hyperlipidemia presents to ED from HD for hypotension. Minimal history from the patient, her son at bedside and reports she has been lethargic, slightly confused and weak since yesterday. No bloody stools at home.  No fevers or chills, no sob or chest pain. Pt denies any complaints. She would answer simple questions and go to sleep. Son reports she never got colonoscopy in the past , as she was high risk for anesthetic complications.   ED Course: on arrival she was afebrile and hypotensive with bp 70/40's. Labs significant for hemoglobin of 5.7 , normal wbc count of 7.3 , creatinine of 5.37, alk phos of 64, AST of 53, ALT of 113. Dr Paulita Fujita from GI consulted, and nephrology on board.  She was referred to medical service for admission for symptomatic anemia.  2 units of prbc transfusion ordered.   Review of Systems: cannot be obtained due to confusion.    Past Medical History:  Diagnosis Date  . CHF (congestive heart failure) ([place])   . Coronary artery disease   . Diabetes mellitus without complication ([place])   . ESRD on dialysis ([place]) 02/10/2017  . Hyperlipidemia   . Hypertension   . Myocardial infarct ([place])   . Stroke [facility])    2-3 yrs ago- right sided weakness    Past Surgical History:  Procedure Laterality Date  . APPENDECTOMY    . AV FISTULA PLACEMENT Right 09/20/2015   Procedure: PLACEMENT OF RIGHT UPPER EXTREMITY ARTERIOVENOUS GORE-TEX GRAFT FOR HEMODIALYSIS ACCESS;  Surgeon: Vickie Epley, MD;  Location: [facility];  Service: Vascular;  Laterality: Right;   . CATARACT EXTRACTION W/PHACO Left 06/29/2013   Procedure: CATARACT EXTRACTION PHACO AND INTRAOCULAR LENS PLACEMENT (IOC);  Surgeon: Tonny Branch, MD;  Location: [facility];  Service: Ophthalmology;  Laterality: Left;  CDE 12.25  . CATARACT EXTRACTION W/PHACO Right 07/27/2013   Procedure: CATARACT EXTRACTION PHACO AND INTRAOCULAR LENS PLACEMENT (IOC);  Surgeon: Tonny Branch, MD;  Location: [facility];  Service: Ophthalmology;  Laterality: Right;  CDE:7.72  . INSERTION OF DIALYSIS CATHETER Right 09/11/2015   Procedure: INSERTION OF TUNNELED DIALYSIS CATHETER;  Surgeon: Vickie Epley, MD;  Location: [facility];  Service: Vascular;  Laterality: Right;  . INTRAMEDULLARY (IM) NAIL INTERTROCHANTERIC Right 07/20/2012   Procedure: INTRAMEDULLARY (IM) NAIL INTERTROCHANTRIC;  Surgeon: Carole Civil, MD;  Location: [facility];  Service: Orthopedics;  Laterality: Right;  . IR AV DIALY SHUNT INTRO NEEDLE/INTRAC INITIAL W/PTA/STENT/IMG RT Right 07/20/2018  . IR GENERIC HISTORICAL  09/25/2015   IR US GUIDE VASC ACCESS RIGHT 09/25/2015 Sandi Mariscal, MD [facility]  . IR GENERIC HISTORICAL  09/25/2015   IR FLUORO GUIDE CV LINE RIGHT 09/25/2015 Sandi Mariscal, MD [facility]  . IR GENERIC HISTORICAL  09/25/2015   IR REMOVAL TUN CV CATH W/O FL 09/25/2015 Sandi Mariscal, MD [facility]  . IR THROMBECTOMY AV FISTULA W/THROMBOLYSIS/PTA INC/SHUNT/IMG RIGHT Right 09/06/2017  . IR THROMBECTOMY AV FISTULA W/THROMBOLYSIS/PTA INC/SHUNT/IMG RIGHT Right 09/29/2017  . IR THROMBECTOMY AV FISTULA W/THROMBOLYSIS/PTA INC/SHUNT/IMG RIGHT Right 07/20/2018  . IR US GUIDE VASC  ACCESS RIGHT  09/06/2017  . IR US GUIDE VASC ACCESS RIGHT  09/29/2017  . IR US GUIDE VASC ACCESS RIGHT  07/20/2018  . IR US GUIDE VASC ACCESS RIGHT  07/20/2018     reports that she has never smoked. She has never used smokeless tobacco. She reports that she does not drink alcohol or use drugs.  Allergies  Allergen Reactions  . Grapefruit Flavor [Flavoring Agent]     Reaction unknown    Family  History  Problem Relation Age of Onset  . Heart disease Mother   . Heart disease Father     Family history reviewed and not pertinent   Prior to Admission medications   Medication Sig Start Date End Date Taking? Authorizing Provider  aspirin EC 81 MG tablet Take 81 mg by mouth daily.   Yes [provider]  atorvastatin (LIPITOR) 20 MG tablet Take 20 mg by mouth daily.   Yes [provider]  calcium-vitamin D (OSCAL WITH D) 500-200 MG-UNIT tablet Take 1 tablet by mouth.   Yes [provider]  cholecalciferol (VITAMIN D) 400 units TABS tablet Take 1,000 Units by mouth daily.    Yes [provider]  clopidogrel (PLAVIX) 75 MG tablet Take 75 mg by mouth daily. 07/27/18  Yes [provider]  escitalopram (LEXAPRO) 5 MG tablet Take 5 mg by mouth daily.   Yes [provider]  furosemide (LASIX) 20 MG tablet Take 20 mg by mouth daily.    Yes [provider]  hydrALAZINE (APRESOLINE) 25 MG tablet Take 25 mg by mouth 3 (three) times daily. 07/22/12  Yes Kathie Dike, MD  isosorbide mononitrate (IMDUR) 30 MG 24 hr tablet Take 30 mg by mouth daily.   Yes [provider]  K Phos Mono-Sod Phos Di & Mono (PHOSPHA 250 NEUTRAL) 155-852-130 MG TABS Take 1 tablet by mouth daily.    Yes [provider]  loperamide (IMODIUM A-D) 2 MG tablet Take 2 mg by mouth every 6 (six) hours as needed for diarrhea or loose stools.   Yes [provider]  Pancrelipase, Lip-Prot-Amyl, (CREON) 6000 units CPEP Take 1 capsule by mouth 3 (three) times daily with meals. 09/27/17  Yes [provider]  omeprazole (PRILOSEC) 40 MG capsule Take 1 capsule (40 mg total) by mouth daily. Patient not taking: Reported on 07/30/2018 02/10/17   [name], NP  oxyCODONE-acetaminophen (ROXICET) 5-325 MG tablet Take 1 tablet by mouth every 6 (six) hours as needed for severe pain. Patient not taking: Reported on 07/30/2018 09/20/15   Vickie Epley,  MD    Physical Exam: Vitals:   07/30/18 1705 07/30/18 1710 07/30/18 1719 07/30/18 1744  BP: (!) 77/53 (!) 87/24 (!) 95/44 (!) 107/42  Pulse: 67 68    Resp: _0 Temp:    97.7 F (36.5 C)  TempSrc:    Oral  SpO2: 97% 97%  93%    Constitutional: chronically ill appearing cachetic looking lady.  Vitals:   07/30/18 1705 07/30/18 1710 07/30/18 1719 07/30/18 1744  BP: (!) 77/53 (!) 87/24 (!) 95/44 (!) 107/42  Pulse: 67 68    Resp: _1 Temp:    97.7 F (36.5 C)  TempSrc:    Oral  SpO2: 97% 97%  93%   Eyes: PERRL, lids and conjunctivae normal ENMT: Mucous membranes are dry Neck: normal, supple, no masses, no thyromegaly Respiratory: clear to auscultation bilaterally, no wheezing, no crackles. Normal respiratory effort.  Cardiovascular: Regular rate and rhythm, no murmurs / Abdomen: no tenderness, no masses palpated. No hepatosplenomegaly. Bowel sounds positive.  Musculoskeletal: no pedal edema.  Skin: no rashes, lesions, ulcers. No induration Neurologic: alert and answering simple questions Psychiatric:cannot be assessed  Labs on Admission: I have personally reviewed following labs and imaging studies  CBC: Recent Labs  Lab 07/30/18 1406  WBC 7.3  NEUTROABS 5.9  HGB 5.7*  HCT 18.7*  MCV 97.4  PLT 638*   Basic Metabolic Panel: Recent Labs  Lab 07/30/18 1406  NA 142  K 3.6  CL 102  CO2 26  GLUCOSE 126*  BUN 64*  CREATININE 5.37*  CALCIUM 7.9*   GFR: Estimated Creatinine Clearance: 6.1 mL/min (A) (by C-G formula based on SCr of 5.37 mg/dL (H)). Liver Function Tests: Recent Labs  Lab 07/30/18 1406  AST 53*  ALT 113*  ALKPHOS 64  BILITOT 0.6  PROT 5.2*  ALBUMIN 2.5*   No results for input(s): LIPASE, AMYLASE in the last 168 hours. No results for input(s): AMMONIA in the last 168 hours. Coagulation Profile: No results for input(s): INR, PROTIME in the last 168 hours. Cardiac Enzymes: No results for input(s): CKTOTAL, CKMB, CKMBINDEX,  TROPONINI in the last 168 hours. BNP (last 3 results) No results for input(s): PROBNP in the last 8760 hours. HbA1C: No results for input(s): HGBA1C in the last 72 hours. CBG: No results for input(s): GLUCAP in the last 168 hours. Lipid Profile: No results for input(s): CHOL, HDL, LDLCALC, TRIG, CHOLHDL, LDLDIRECT in the last 72 hours. Thyroid Function Tests: No results for input(s): TSH, T4TOTAL, FREET4, T3FREE, THYROIDAB in the last 72 hours. Anemia Panel: No results for input(s): VITAMINB12, FOLATE, FERRITIN, TIBC, IRON, RETICCTPCT in the last 72 hours. Urine analysis:    Component Value Date/Time   COLORURINE YELLOW 07/17/2012 1751   APPEARANCEUR CLOUDY (A) 07/17/2012 1751   LABSPEC 1.020 07/17/2012 1751   PHURINE 5.5 07/17/2012 1751   GLUCOSEU NEGATIVE 07/17/2012 1751   HGBUR SMALL (A) 07/17/2012 1751   BILIRUBINUR NEGATIVE 07/17/2012 1751   KETONESUR NEGATIVE 07/17/2012 1751   PROTEINUR 30 (A) 07/17/2012 1751   UROBILINOGEN 0.2 07/17/2012 1751   NITRITE NEGATIVE 07/17/2012 1751   LEUKOCYTESUR SMALL (A) 07/17/2012 1751    Radiological Exams on Admission: Dg Chest Portable 1 View  Result Date: 07/30/2018 CLINICAL DATA:  Hypotension. CHF. Diabetes. End-stage renal disease. EXAM: PORTABLE CHEST 1 VIEW COMPARISON:  09/08/2017 FINDINGS: Numerous leads and wires project over the chest. Right axillary vascular stent. Moderate cardiomegaly. Atherosclerosis in the transverse aorta. Increase in moderate left pleural effusion. Small right pleural effusion, increased. Congestive heart failure is moderate and increased. Worse on the left. Left greater than right base airspace disease, increased. Skin fold over the left hemithorax. IMPRESSION: Worsened aeration with progressive congestive heart failure and left greater than right pleural effusions. Left greater than right base airspace disease could represent atelectasis. Concurrent infection cannot be excluded. Aortic Atherosclerosis  (ICD10-I70.0). Electronically Signed   By: Abigail Miyamoto M.D.   On: 07/30/2018 16:14     Assessment/Plan Active Problems:   Symptomatic anemia    Symptomatic anemia  Differential include blood loss anemia , acute on anemia of chronic disease.  Admit to progressive care.  2 units of prbc ordered.  Transfuse to keep hemoglobin greater than 7.  Stool for occult blood is positive.  GI consulted,.  CT abd and pelvis without contrast ordered.  H&h every 12 hours. Hold aspirin and plavix.  Continue with PPI  IV . Npo except for sips of water.     ESRD on HD; Further management as per renal.  She missed her HD today.     CAD: No chest pain or sob.  Holding aspirin and plavix.    Hyperlipidemia: Holding atorvastatin.   Hypotension:  Resolved with 500 NS bolus.  Hold all bp meds.   Severity of Illness: The appropriate patient status for this patient is INPATIENT. Inpatient status is judged to be reasonable and necessary in order to provide the required intensity of service to ensure the patient's safety. The patient's presenting symptoms, physical exam findings, and initial radiographic and laboratory data in the context of their chronic comorbidities is felt to place them at high risk for further clinical deterioration. Furthermore, it is not anticipated that the patient will be medically stable for discharge from the hospital within 2 midnights of admission. The following factors support the patient status of inpatient.   " The patient's presenting symptoms include hypotension, severe anemia .   * I certify that at the point of admission it is my clinical judgment that the patient will require inpatient hospital care spanning beyond 2 midnights from the point of admission due to high intensity of service, high risk for further deterioration and high frequency of surveillance required.*    DVT prophylaxis: scd's Code Status:  Full code.  Family Communication: son at bedside   Disposition Plan: pending further work up Consults called: GI, nephrology.  Admission status: inpatient   Hosie Poisson MD [facility] Pager [phone]  If 7PM-7AM, please contact night-coverage [email or web address] Password [facility]  07/30/2018, 6:52 PM

## 2018-07-31 LAB — RETICULOCYTES
Immature Retic Fract: 10.5 % (ref 2.3–15.9)
RBC.: 2.64 MIL/uL — ABNORMAL LOW (ref 3.87–5.11)
Retic Count, Absolute: 54.4 10*3/uL (ref 19.0–186.0)
Retic Ct Pct: 2.1 % (ref 0.4–3.1)

## 2018-07-31 LAB — IRON AND TIBC
Iron: 111 ug/dL (ref 28–170)
Saturation Ratios: 82 % — ABNORMAL HIGH (ref 10.4–31.8)
TIBC: 136 ug/dL — ABNORMAL LOW (ref 250–450)
UIBC: 25 ug/dL

## 2018-07-31 LAB — FOLATE: Folate: 77.4 ng/mL (ref >5.9)

## 2018-07-31 LAB — VITAMIN B12: Vitamin B-12: 1571 pg/mL — ABNORMAL HIGH (ref 180–914)

## 2018-07-31 LAB — HEMOGLOBIN AND HEMATOCRIT, BLOOD
HCT: 22.6 % — ABNORMAL LOW (ref 36.0–46.0)
Hemoglobin: 7.4 g/dL — ABNORMAL LOW (ref 12.0–15.0)

## 2018-07-31 LAB — FERRITIN: Ferritin: 1324 ng/mL — ABNORMAL HIGH (ref 11–307)

## 2018-07-31 MED ORDER — PANTOPRAZOLE SODIUM 40 MG IV SOLR
40.0000 mg | INTRAVENOUS | Status: DC
  Administered 2018-07-31 – 2018-08-05 (×6): 40 mg via INTRAVENOUS
  Filled 2018-07-31 (×6): qty 40

## 2018-07-31 MED ORDER — CHLORHEXIDINE GLUCONATE CLOTH 2 % EX PADS
6.0000 | MEDICATED_PAD | Freq: Every day | CUTANEOUS | Status: DC
  Administered 2018-08-01 – 2018-08-05 (×6): 6 via TOPICAL

## 2018-07-31 MED ORDER — HYDROCODONE-ACETAMINOPHEN 5-325 MG PO TABS
1.0000 | ORAL_TABLET | Freq: Four times a day (QID) | ORAL | Status: DC | PRN
  Administered 2018-07-31 – 2018-08-02 (×2): 1 via ORAL
  Filled 2018-07-31 (×2): qty 1

## 2018-07-31 MED ORDER — NONFORMULARY OR COMPOUNDED ITEM
1.0000 | Freq: Three times a day (TID) | Status: DC
  Administered 2018-07-31 – 2018-08-19 (×39): 1 via ORAL
  Filled 2018-07-31 (×45): qty 1

## 2018-07-31 MED ORDER — DARBEPOETIN ALFA 150 MCG/0.3ML IJ SOSY
150.0000 ug | PREFILLED_SYRINGE | INTRAMUSCULAR | Status: DC
  Filled 2018-07-31: qty 0.3

## 2018-07-31 MED ORDER — ACETAMINOPHEN 325 MG PO TABS
650.0000 mg | ORAL_TABLET | Freq: Four times a day (QID) | ORAL | Status: DC | PRN
  Administered 2018-08-05 – 2018-08-09 (×5): 650 mg via ORAL
  Filled 2018-07-31 (×5): qty 2

## 2018-07-31 MED ORDER — ONDANSETRON HCL 4 MG/2ML IJ SOLN
4.0000 mg | Freq: Four times a day (QID) | INTRAMUSCULAR | Status: DC | PRN
  Administered 2018-07-31 – 2018-08-02 (×2): 4 mg via INTRAVENOUS
  Filled 2018-07-31 (×2): qty 2

## 2018-07-31 NOTE — Progress Notes (Signed)
Subjective:  Pt sleeping, she thinks she is at home.  GI saw- not considering invasive eval - hgb 8.1 last night- 7.4 today   Objective Vital signs in last 24 hours: Vitals:   07/31/18 0234 07/31/18 0544 07/31/18 0748 07/31/18 1208  BP: (!) 95/48 (!) 99/56 (!) 95/49 (!) 108/52  Pulse:    (!) 56  Resp: 13 15 15 14   Temp:  97.8 F (36.6 C) 97.8 F (36.6 C) 97.6 F (36.4 C)  TempSrc:  Oral Oral Oral  SpO2:   99% 97%   Weight change:   Intake/Output Summary (Last 24 hours) at 07/31/2018 1253 Last data filed at 07/31/2018 0941 Gross per 24 hour  Intake 1368.55 ml  Output -  Net 1368.55 ml    Dialyzes at [employer] know other info  Access right upper AVF   Assessment/Plan: 78 year old BF with multiple medical problems including ESRD.  Presents with weakness found to have hgb of 5.7 1 hgb of 5.7- on 5/27 was 8.5 when she came for access procedure.  Son denies overt bleeding.  Do not have recent information from the kidney center about iron or ESA dosing.  s/p  2 units on 6/6 .  I will  dose with ESA.  GI not to consider invasive eval at this time- appropriate   2 ESRD: normally TTS at [facility] via AVG.  Last HD was Thursday.  No acute need right now and no staff to run.  Will plan for  Monday  3 Hypertension: BP is low- looks like at home is on hydralazine and lasix- being held.  Her volume looks fine right now, she tells me that she makes urine   5. Metabolic Bone Disease: no information and no labs- no bone active meds on home med list     Louis Meckel    Labs: Basic Metabolic Panel: Recent Labs  Lab 07/30/18 1406  NA 142  K 3.6  CL 102  CO2 26  GLUCOSE 126*  BUN 64*  CREATININE 5.37*  CALCIUM 7.9*   Liver Function Tests: Recent Labs  Lab 07/30/18 1406  AST 53*  ALT 113*  ALKPHOS 64  BILITOT 0.6  PROT 5.2*  ALBUMIN 2.5*   No results for input(s): LIPASE, AMYLASE in the last 168 hours. No results for input(s): AMMONIA in the last 168  hours. CBC: Recent Labs  Lab 07/30/18 1406 07/30/18 2111 07/31/18 0913  WBC 7.3  --   --   NEUTROABS 5.9  --   --   HGB 5.7* 8.1* 7.4*  HCT 18.7* 24.8* 22.6*  MCV 97.4  --   --   PLT 145*  --   --    Cardiac Enzymes: No results for input(s): CKTOTAL, CKMB, CKMBINDEX, TROPONINI in the last 168 hours. CBG: No results for input(s): GLUCAP in the last 168 hours.  Iron Studies:  Recent Labs    07/31/18 0336  IRON 111  TIBC 136*  FERRITIN 1,324*   Studies/Results: Ct Abdomen Pelvis Wo Contrast  Result Date: 07/30/2018 CLINICAL DATA:  78 year old female with history of diverticulosis. Acute in the media from suspected blood-loss. EXAM: CT ABDOMEN AND PELVIS WITHOUT CONTRAST TECHNIQUE: Multidetector CT imaging of the abdomen and pelvis was performed following the standard protocol without IV contrast. COMPARISON:  CT the abdomen and pelvis 05/26/2017. FINDINGS: Lower chest: Low-attenuation in the intravascular compartment suggestive of anemia. Atherosclerotic calcifications in the thoracic aorta, as well as the left main, left  anterior descending, left circumflex and right coronary arteries. Moderate right and large left pleural effusions with extensive passive atelectasis in the lower lungs bilaterally. Hepatobiliary: No definite suspicious cystic or solid hepatic lesions are noted on today's noncontrast CT examination. Gallbladder is nearly decompressed, but otherwise unremarkable in appearance. Pancreas: No definite pancreatic mass or peripancreatic fluid or inflammatory changes noted on today's noncontrast CT examination. Spleen: Unremarkable. Adrenals/Urinary Tract: Unenhanced appearance of the kidneys and bilateral adrenal glands are normal. Urinary bladder is normal in appearance. Stomach/Bowel: Normal appearance of the stomach. No pathologic dilatation of small bowel or colon. The appendix is not confidently identified and may be surgically absent. Regardless, there are no inflammatory  changes noted adjacent to the cecum to suggest the presence of an acute appendicitis at this time. Vascular/Lymphatic: Aortic atherosclerosis. No lymphadenopathy noted in the abdomen or pelvis. Reproductive: Uterus and ovaries are atrophic. Small amount of gas in the endometrial canal. Other: Trace volume of ascites.  No pneumoperitoneum. Musculoskeletal: There are no aggressive appearing lytic or blastic lesions noted in the visualized portions of the skeleton. Status post ORIF in the right proximal femur. IMPRESSION: 1. Small amount of gas in the endometrial canal. This is of uncertain etiology and significance, but is abnormal, and could indicate endometritis with infection from gas-forming organisms, or could be related to recent procedure. Further clinical evaluation is recommended. 2. No definite source for blood loss confidently identified in the abdomen or pelvis. 3. Small volume of ascites. 4. Large left and moderate right pleural effusions with extensive passive atelectasis in the lower lobes of the lungs bilaterally. 5. Aortic atherosclerosis, in addition to left main and 3 vessel coronary artery disease. Assessment for potential risk factor modification, dietary therapy or pharmacologic therapy may be warranted, if clinically indicated. 6. Additional incidental findings, as above. Electronically Signed   By: Vinnie Langton M.D.   On: 07/30/2018 22:19   Dg Chest Portable 1 View  Result Date: 07/30/2018 CLINICAL DATA:  Hypotension. CHF. Diabetes. End-stage renal disease. EXAM: PORTABLE CHEST 1 VIEW COMPARISON:  09/08/2017 FINDINGS: Numerous leads and wires project over the chest. Right axillary vascular stent. Moderate cardiomegaly. Atherosclerosis in the transverse aorta. Increase in moderate left pleural effusion. Small right pleural effusion, increased. Congestive heart failure is moderate and increased. Worse on the left. Left greater than right base airspace disease, increased. Skin fold over the  left hemithorax. IMPRESSION: Worsened aeration with progressive congestive heart failure and left greater than right pleural effusions. Left greater than right base airspace disease could represent atelectasis. Concurrent infection cannot be excluded. Aortic Atherosclerosis (ICD10-I70.0). Electronically Signed   By: Abigail Miyamoto M.D.   On: 07/30/2018 16:14   Medications: Infusions:   Scheduled Medications: . sodium chloride   Intravenous Once  . escitalopram  5 mg Oral Daily  . Pancrelipase (Lip-Prot-Amyl)  1 capsule Oral TID WC  . pantoprazole (PROTONIX) IV  40 mg Intravenous Q24H    have reviewed scheduled and prn medications.  Physical Exam: General: somnolent- arousable no c/o's - thinks she is at home Heart: RRR- brady  Lungs: mostly clear Abdomen: nontender Extremities: really no edema Dialysis Access: right AVF     07/31/2018,12:53 PM  LOS: 1 day

## 2018-07-31 NOTE — Progress Notes (Signed)
PROGRESS NOTE    Courtney [name]  JJH:[id number] DOB: 1940/05/26 DOA: 07/30/2018 PCP: Neale Burly, MD    Brief Narrative:  78 y.o. female with medical history significant of ESRD ON HD, TThsat, diastolic heart failure, stroke, CAD , DM, hypertension, hyperlipidemia presents to ED from HD for hypotension. Minimal history from the patient, her son at bedside and reports she has been lethargic, slightly confused and weak. On arrival she was afebrile and hypotensive with bp 70/40's. Labs significant for hemoglobin of 5.7 , normal wbc count of 7.3 , creatinine of 5.37, alk phos of 64, AST of 53, ALT of 113. Dr Paulita Fujita from GI consulted, and nephrology on board.  She was referred to medical service for admission for symptomatic anemia.  2 units of prbc transfusion ordered.   Assessment & Plan:   Active Problems:   Symptomatic anemia  Symptomatic anemia  -2 units of prbc ordered with hgb from 5.7, currently 7.4 -Transfuse to keep hemoglobin greater than 7.  -Stool for occult blood is positive.  -GI consulted, appreciate input. Given overall state of health, not candidate for endoscopy at this time, unless it needs to be done urgently -CT abd and pelvis without contrast ordered and personally reviewed. Findings notable for small amount of gas in the endometrial canal of uncertain etiology. Also findings of large L and moderate R pleural effusions with extensive passive atelectasis -Discussed with GYN on call regarding above CT finding who does not recommend further work up of above endometrial gas. Pt afebrile with no leukocytosis - aspirin and plavix remains on hold.   ESRD on HD; -Nephrology following -Continue with HD as tolerated  CAD: -Continuing to hold aspirin and plavix.  -Seems stable at present  Hyperlipidemia: -Continuing to hold atorvastatin.   Hypotension:  -Resolved with 500 NS bolus.  -Cont to hold hypertensive meds    DVT prophylaxis: SCD's Code Status:  Full Family Communication: Pt in room, family not at bedside Disposition Plan: Uncertain at this time  Consultants:   GI  Discussed with on-call GYN over phone  Procedures:     Antimicrobials: Anti-infectives (From admission, onward)   None       Subjective: Seems somewhat confused this AM  Objective: Vitals:   07/31/18 0234 07/31/18 0544 07/31/18 0748 07/31/18 1208  BP: (!) 95/48 (!) 99/56 (!) 95/49 (!) 108/52  Pulse:    (!) 56  Resp: _0 Temp:  97.8 F (36.6 C) 97.8 F (36.6 C) 97.6 F (36.4 C)  TempSrc:  Oral Oral Oral  SpO2:   99% 97%    Intake/Output Summary (Last 24 hours) at 07/31/2018 1557 Last data filed at 07/31/2018 0941 Gross per 24 hour  Intake 918.55 ml  Output --  Net 918.55 ml   There were no vitals filed for this visit.  Examination:  General exam: Appears calm and comfortable  Respiratory system: Clear to auscultation. Respiratory effort normal. Cardiovascular system: S1 & S2 heard, RRR Gastrointestinal system: Abdomen is nondistended, soft and nontender. No organomegaly or masses felt. Normal bowel sounds heard. Central nervous system: Alert and oriented. No focal neurological deficits. Extremities: Symmetric 5 x 5 power. Skin: No rashes, lesions  Psychiatry: Judgement and insight appear normal. Mood & affect appropriate.   Data Reviewed: I have personally reviewed following labs and imaging studies  CBC: Recent Labs  Lab 07/30/18 1406 07/30/18 2111 07/31/18 0913  WBC 7.3  --   --   NEUTROABS 5.9  --   --  HGB 5.7* 8.1* 7.4*  HCT 18.7* 24.8* 22.6*  MCV 97.4  --   --   PLT 145*  --   --    Basic Metabolic Panel: Recent Labs  Lab 07/30/18 1406  NA 142  K 3.6  CL 102  CO2 26  GLUCOSE 126*  BUN 64*  CREATININE 5.37*  CALCIUM 7.9*   GFR: Estimated Creatinine Clearance: 6.1 mL/min (A) (by C-G formula based on SCr of 5.37 mg/dL (H)). Liver Function Tests: Recent Labs  Lab 07/30/18 1406  AST 53*  ALT 113*   ALKPHOS 64  BILITOT 0.6  PROT 5.2*  ALBUMIN 2.5*   No results for input(s): LIPASE, AMYLASE in the last 168 hours. No results for input(s): AMMONIA in the last 168 hours. Coagulation Profile: No results for input(s): INR, PROTIME in the last 168 hours. Cardiac Enzymes: No results for input(s): CKTOTAL, CKMB, CKMBINDEX, TROPONINI in the last 168 hours. BNP (last 3 results) No results for input(s): PROBNP in the last 8760 hours. HbA1C: No results for input(s): HGBA1C in the last 72 hours. CBG: No results for input(s): GLUCAP in the last 168 hours. Lipid Profile: No results for input(s): CHOL, HDL, LDLCALC, TRIG, CHOLHDL, LDLDIRECT in the last 72 hours. Thyroid Function Tests: No results for input(s): TSH, T4TOTAL, FREET4, T3FREE, THYROIDAB in the last 72 hours. Anemia Panel: Recent Labs    07/31/18 0336  VITAMINB12 1,571*  FOLATE 77.4  FERRITIN 1,324*  TIBC 136*  IRON 111  RETICCTPCT 2.1   Sepsis Labs: No results for input(s): PROCALCITON, LATICACIDVEN in the last 168 hours.  Recent Results (from the past 240 hour(s))  SARS Coronavirus 2 (CEPHEID - Performed in [place] hospital lab), Hosp Order     Status: None   Collection Time: 07/30/18  6:52 PM  Result Value Ref Range Status   SARS Coronavirus 2 NEGATIVE NEGATIVE Final    Comment: (NOTE) If result is NEGATIVE SARS-CoV-2 target nucleic acids are NOT DETECTED. The SARS-CoV-2 RNA is generally detectable in upper and lower  respiratory specimens during the acute phase of infection. The lowest  concentration of SARS-CoV-2 viral copies this assay can detect is 250  copies / mL. A negative result does not preclude SARS-CoV-2 infection  and should not be used as the sole basis for treatment or other  patient management decisions.  A negative result may occur with  improper specimen collection / handling, submission of specimen other  than nasopharyngeal swab, presence of viral mutation(s) within the  areas targeted  by this assay, and inadequate number of viral copies  (<250 copies / mL). A negative result must be combined with clinical  observations, patient history, and epidemiological information. If result is POSITIVE SARS-CoV-2 target nucleic acids are DETECTED. The SARS-CoV-2 RNA is generally detectable in upper and lower  respiratory specimens dur ing the acute phase of infection.  Positive  results are indicative of active infection with SARS-CoV-2.  Clinical  correlation with patient history and other diagnostic information is  necessary to determine patient infection status.  Positive results do  not rule out bacterial infection or co-infection with other viruses. If result is PRESUMPTIVE POSTIVE SARS-CoV-2 nucleic acids MAY BE PRESENT.   A presumptive positive result was obtained on the submitted specimen  and confirmed on repeat testing.  While 2019 novel coronavirus  (SARS-CoV-2) nucleic acids may be present in the submitted sample  additional confirmatory testing may be necessary for epidemiological  and / or clinical management purposes  to differentiate  between  SARS-CoV-2 and other Sarbecovirus currently known to infect humans.  If clinically indicated additional testing with an alternate test  methodology [phone]) is advised. The SARS-CoV-2 RNA is generally  detectable in upper and lower respiratory sp ecimens during the acute  phase of infection. The expected result is Negative. Fact Sheet for Patients:  StrictlyIdeas.no Fact Sheet for Healthcare Providers: BankingDealers.co.za This test is not yet approved or cleared by the [place] FDA and has been authorized for detection and/or diagnosis of SARS-CoV-2 by FDA under an Emergency Use Authorization (EUA).  This EUA will remain in effect (meaning this test can be used) for the duration of the COVID-19 declaration under Section 564(b)(1) of the Act, 21 U.S.C. section  360bbb-3(b)(1), unless the authorization is terminated or revoked sooner. Performed at [facility], [place] [address] [place], [place] [address]      Radiology Studies: Ct Abdomen Pelvis Wo Contrast  Result Date: 07/30/2018 CLINICAL DATA:  78 year old female with history of diverticulosis. Acute in the media from suspected blood-loss. EXAM: CT ABDOMEN AND PELVIS WITHOUT CONTRAST TECHNIQUE: Multidetector CT imaging of the abdomen and pelvis was performed following the standard protocol without IV contrast. COMPARISON:  CT the abdomen and pelvis 05/26/2017. FINDINGS: Lower chest: Low-attenuation in the intravascular compartment suggestive of anemia. Atherosclerotic calcifications in the thoracic aorta, as well as the left main, left anterior descending, left circumflex and right coronary arteries. Moderate right and large left pleural effusions with extensive passive atelectasis in the lower lungs bilaterally. Hepatobiliary: No definite suspicious cystic or solid hepatic lesions are noted on today's noncontrast CT examination. Gallbladder is nearly decompressed, but otherwise unremarkable in appearance. Pancreas: No definite pancreatic mass or peripancreatic fluid or inflammatory changes noted on today's noncontrast CT examination. Spleen: Unremarkable. Adrenals/Urinary Tract: Unenhanced appearance of the kidneys and bilateral adrenal glands are normal. Urinary bladder is normal in appearance. Stomach/Bowel: Normal appearance of the stomach. No pathologic dilatation of small bowel or colon. The appendix is not confidently identified and may be surgically absent. Regardless, there are no inflammatory changes noted adjacent to the cecum to suggest the presence of an acute appendicitis at this time. Vascular/Lymphatic: Aortic atherosclerosis. No lymphadenopathy noted in the abdomen or pelvis. Reproductive: Uterus and ovaries are atrophic. Small amount of gas in the endometrial canal. Other: Trace volume of  ascites.  No pneumoperitoneum. Musculoskeletal: There are no aggressive appearing lytic or blastic lesions noted in the visualized portions of the skeleton. Status post ORIF in the right proximal femur. IMPRESSION: 1. Small amount of gas in the endometrial canal. This is of uncertain etiology and significance, but is abnormal, and could indicate endometritis with infection from gas-forming organisms, or could be related to recent procedure. Further clinical evaluation is recommended. 2. No definite source for blood loss confidently identified in the abdomen or pelvis. 3. Small volume of ascites. 4. Large left and moderate right pleural effusions with extensive passive atelectasis in the lower lobes of the lungs bilaterally. 5. Aortic atherosclerosis, in addition to left main and 3 vessel coronary artery disease. Assessment for potential risk factor modification, dietary therapy or pharmacologic therapy may be warranted, if clinically indicated. 6. Additional incidental findings, as above. Electronically Signed   By: Vinnie Langton M.D.   On: 07/30/2018 22:19   Dg Chest Portable 1 View  Result Date: 07/30/2018 CLINICAL DATA:  Hypotension. CHF. Diabetes. End-stage renal disease. EXAM: PORTABLE CHEST 1 VIEW COMPARISON:  09/08/2017 FINDINGS: Numerous leads and wires project over the chest. Right axillary  vascular stent. Moderate cardiomegaly. Atherosclerosis in the transverse aorta. Increase in moderate left pleural effusion. Small right pleural effusion, increased. Congestive heart failure is moderate and increased. Worse on the left. Left greater than right base airspace disease, increased. Skin fold over the left hemithorax. IMPRESSION: Worsened aeration with progressive congestive heart failure and left greater than right pleural effusions. Left greater than right base airspace disease could represent atelectasis. Concurrent infection cannot be excluded. Aortic Atherosclerosis (ICD10-I70.0). Electronically  Signed   By: Abigail Miyamoto M.D.   On: 07/30/2018 16:14    Scheduled Meds:  sodium chloride   Intravenous Once   [START ON 08/01/2018] Chlorhexidine Gluconate Cloth  6 each Topical Q0600   [START ON 08/01/2018] darbepoetin (ARANESP) injection - DIALYSIS  150 mcg Intravenous Q Mon-HD   escitalopram  5 mg Oral Daily   Pancrelipase (Lip-Prot-Amyl)  1 capsule Oral TID WC   pantoprazole (PROTONIX) IV  40 mg Intravenous Q24H   Continuous Infusions:   LOS: 1 day   Marylu Lund, MD [facility] Pager On Amion  If 7PM-7AM, please contact night-coverage 07/31/2018, 3:57 PM

## 2018-07-31 NOTE — Consult Note (Signed)
[facility] Consultation Note  Referring Provider: [facility] Primary Care Physician:  Neale Burly, MD  Reason for Consultation:  Anemia, hemoccult-positive stool  HPI: Courtney [name] is a 78 y.o. female admitted with confusion and hypotension from dialysis center.  She has Hgb ~ 6.  Multiple medical problems, on clopidigrel.  Patient doesn't know her name, and can't answer any questions.  There is no report of hematemesis or overt blood in stool.  Unclear if prior endoscopy or colonoscopy, but notes from this admission note family member reporting she was too high risk for colonoscopy when evaluated for this in the past.   Past Medical History:  Diagnosis Date  . CHF (congestive heart failure) ([place])   . Coronary artery disease   . Diabetes mellitus without complication ([place])   . ESRD on dialysis ([place]) 02/10/2017  . Hyperlipidemia   . Hypertension   . Myocardial infarct ([place])   . Stroke [facility])    2-3 yrs ago- right sided weakness    Past Surgical History:  Procedure Laterality Date  . APPENDECTOMY    . AV FISTULA PLACEMENT Right 09/20/2015   Procedure: PLACEMENT OF RIGHT UPPER EXTREMITY ARTERIOVENOUS GORE-TEX GRAFT FOR HEMODIALYSIS ACCESS;  Surgeon: Vickie Epley, MD;  Location: [facility];  Service: Vascular;  Laterality: Right;  . CATARACT EXTRACTION W/PHACO Left 06/29/2013   Procedure: CATARACT EXTRACTION PHACO AND INTRAOCULAR LENS PLACEMENT (IOC);  Surgeon: Tonny Branch, MD;  Location: [facility];  Service: Ophthalmology;  Laterality: Left;  CDE 12.25  . CATARACT EXTRACTION W/PHACO Right 07/27/2013   Procedure: CATARACT EXTRACTION PHACO AND INTRAOCULAR LENS PLACEMENT (IOC);  Surgeon: Tonny Branch, MD;  Location: [facility];  Service: Ophthalmology;  Laterality: Right;  CDE:7.72  . INSERTION OF DIALYSIS CATHETER Right 09/11/2015   Procedure: INSERTION OF TUNNELED DIALYSIS CATHETER;  Surgeon: Vickie Epley, MD;  Location: [facility];  Service: Vascular;  Laterality: Right;  .  INTRAMEDULLARY (IM) NAIL INTERTROCHANTERIC Right 07/20/2012   Procedure: INTRAMEDULLARY (IM) NAIL INTERTROCHANTRIC;  Surgeon: Carole Civil, MD;  Location: [facility];  Service: Orthopedics;  Laterality: Right;  . IR AV DIALY SHUNT INTRO NEEDLE/INTRAC INITIAL W/PTA/STENT/IMG RT Right 07/20/2018  . IR GENERIC HISTORICAL  09/25/2015   IR US GUIDE VASC ACCESS RIGHT 09/25/2015 Sandi Mariscal, MD [facility]  . IR GENERIC HISTORICAL  09/25/2015   IR FLUORO GUIDE CV LINE RIGHT 09/25/2015 Sandi Mariscal, MD [facility]  . IR GENERIC HISTORICAL  09/25/2015   IR REMOVAL TUN CV CATH W/O FL 09/25/2015 Sandi Mariscal, MD [facility]  . IR THROMBECTOMY AV FISTULA W/THROMBOLYSIS/PTA INC/SHUNT/IMG RIGHT Right 09/06/2017  . IR THROMBECTOMY AV FISTULA W/THROMBOLYSIS/PTA INC/SHUNT/IMG RIGHT Right 09/29/2017  . IR THROMBECTOMY AV FISTULA W/THROMBOLYSIS/PTA INC/SHUNT/IMG RIGHT Right 07/20/2018  . IR US GUIDE VASC ACCESS RIGHT  09/06/2017  . IR US GUIDE VASC ACCESS RIGHT  09/29/2017  . IR US GUIDE VASC ACCESS RIGHT  07/20/2018  . IR US GUIDE VASC ACCESS RIGHT  07/20/2018    Prior to Admission medications   Medication Sig Start Date End Date Taking? Authorizing Provider  aspirin EC 81 MG tablet Take 81 mg by mouth daily.   Yes [provider]  atorvastatin (LIPITOR) 20 MG tablet Take 20 mg by mouth daily.   Yes [provider]  calcium-vitamin D (OSCAL WITH D) 500-200 MG-UNIT tablet Take 1 tablet by mouth.   Yes [provider]  cholecalciferol (VITAMIN D) 400 units TABS tablet Take 1,000 Units by mouth daily.  Yes [provider]  clopidogrel (PLAVIX) 75 MG tablet Take 75 mg by mouth daily. 07/27/18  Yes [provider]  escitalopram (LEXAPRO) 5 MG tablet Take 5 mg by mouth daily.   Yes [provider]  furosemide (LASIX) 20 MG tablet Take 20 mg by mouth daily.    Yes [provider]  hydrALAZINE (APRESOLINE) 25 MG tablet Take 25 mg by mouth 3 (three) times daily. 07/22/12   Yes Kathie Dike, MD  isosorbide mononitrate (IMDUR) 30 MG 24 hr tablet Take 30 mg by mouth daily.   Yes [provider]  K Phos Mono-Sod Phos Di & Mono (PHOSPHA 250 NEUTRAL) 155-852-130 MG TABS Take 1 tablet by mouth daily.    Yes [provider]  loperamide (IMODIUM A-D) 2 MG tablet Take 2 mg by mouth every 6 (six) hours as needed for diarrhea or loose stools.   Yes [provider]  Pancrelipase, Lip-Prot-Amyl, (CREON) 6000 units CPEP Take 1 capsule by mouth 3 (three) times daily with meals. 09/27/17  Yes [provider]  omeprazole (PRILOSEC) 40 MG capsule Take 1 capsule (40 mg total) by mouth daily. Patient not taking: Reported on 07/30/2018 02/10/17   Courtney Penny, NP  oxyCODONE-acetaminophen (ROXICET) 5-325 MG tablet Take 1 tablet by mouth every 6 (six) hours as needed for severe pain. Patient not taking: Reported on 07/30/2018 09/20/15   Vickie Epley, MD    Current Facility-Administered Medications  Medication Dose Route Frequency Provider Last Rate Last Dose  . 0.9 %  sodium chloride infusion (Manually program via Guardrails IV Fluids)   Intravenous Once Hosie Poisson, MD      . escitalopram (LEXAPRO) tablet 5 mg  5 mg Oral Daily Hosie Poisson, MD   5 mg at 07/31/18 0940  . Pancrelipase (Lip-Prot-Amyl) CPEP 6,000 Units  1 capsule Oral TID WC Hosie Poisson, MD   Stopped at 07/31/18 [phone]  . pantoprazole (PROTONIX) 80 mg in sodium chloride 0.9 % 250 mL (0.32 mg/mL) infusion  8 mg/hr Intravenous Continuous Hosie Poisson, MD 25 mL/hr at 07/31/18 0750 8 mg/hr at 07/31/18 0750  . [START ON 08/03/2018] pantoprazole (PROTONIX) injection 40 mg  40 mg Intravenous Q12H Hosie Poisson, MD        Allergies as of 07/30/2018 - Review Complete 07/30/2018  Allergen Reaction Noted  . Grapefruit flavor [flavoring agent]  09/10/2015    Family History  Problem Relation Age of Onset  . Heart disease Mother   . Heart disease Father     Social History    Socioeconomic History  . Marital status: Single    Spouse name: Not on file  . Number of children: Not on file  . Years of education: Not on file  . Highest education level: Not on file  Occupational History  . Not on file  Social Needs  . Financial resource strain: Not on file  . Food insecurity:    Worry: Not on file    Inability: Not on file  . Transportation needs:    Medical: Not on file    Non-medical: Not on file  Tobacco Use  . Smoking status: Never Smoker  . Smokeless tobacco: Never Used  Substance and Sexual Activity  . Alcohol use: No  . Drug use: No  . Sexual activity: Never    Birth control/protection: None  Lifestyle  . Physical activity:    Days per week: Not on file    Minutes per session: Not on file  . Stress:  Not on file  Relationships  . Social connections:    Talks on phone: Not on file    Gets together: Not on file    Attends religious service: Not on file    Active member of club or organization: Not on file    Attends meetings of clubs or organizations: Not on file    Relationship status: Not on file  . Intimate partner violence:    Fear of current or ex partner: Not on file    Emotionally abused: Not on file    Physically abused: Not on file    Forced sexual activity: Not on file  Other Topics Concern  . Not on file  Social History Narrative  . Not on file    Review of Systems: Unable to obtain due to patient's mental status  Physical Exam: Vital signs in last 24 hours: Temp:  [97.4 F (36.3 C)-99 F (37.2 C)] 97.8 F (36.6 C) (06/07 0748) Pulse Rate:  [61-73] 61 (06/06 2128) Resp:  [13-23] 15 (06/07 0748) BP: (75-108)/(22-73) 95/49 (06/07 0748) SpO2:  [93 %-100 %] 99 % (06/07 0748) Last BM Date: 07/30/18 General:   Awake, can't answer questions well, thin and cachectic-appearing Head:  Normocephalic and atraumatic. Eyes:  Sclera clear, no icterus.   Conjunctiva pink. Ears:  Normal auditory acuity. Nose:  No deformity,  discharge,  or lesions. Mouth:  No deformity or lesions.  Oropharynx pink & moist. Neck:  Supple; no masses or thyromegaly. Abdomen:  Soft, nontender and nondistended. No masses, hepatosplenomegaly or hernias noted. Normal bowel sounds, without guarding, and without rebound.     Msk:  Symmetrical without gross deformities. Normal posture. Pulses:  Normal pulses noted. Extremities:  Without clubbing or edema. Neurologic: Confused, can't answer questions appropriately Skin:  Intact without significant lesions or rashes. Psych:  Awake but confused; not combative   Lab Results: Recent Labs    07/30/18 1406 07/30/18 2111 07/31/18 0913  WBC 7.3  --   --   HGB 5.7* 8.1* 7.4*  HCT 18.7* 24.8* 22.6*  PLT 145*  --   --    BMET Recent Labs    07/30/18 1406  NA 142  K 3.6  CL 102  CO2 26  GLUCOSE 126*  BUN 64*  CREATININE 5.37*  CALCIUM 7.9*   LFT Recent Labs    07/30/18 1406  PROT 5.2*  ALBUMIN 2.5*  AST 53*  ALT 113*  ALKPHOS 64  BILITOT 0.6   PT/INR No results for input(s): LABPROT, INR in the last 72 hours.  Studies/Results: Ct Abdomen Pelvis Wo Contrast  Result Date: 07/30/2018 CLINICAL DATA:  78 year old female with history of diverticulosis. Acute in the media from suspected blood-loss. EXAM: CT ABDOMEN AND PELVIS WITHOUT CONTRAST TECHNIQUE: Multidetector CT imaging of the abdomen and pelvis was performed following the standard protocol without IV contrast. COMPARISON:  CT the abdomen and pelvis 05/26/2017. FINDINGS: Lower chest: Low-attenuation in the intravascular compartment suggestive of anemia. Atherosclerotic calcifications in the thoracic aorta, as well as the left main, left anterior descending, left circumflex and right coronary arteries. Moderate right and large left pleural effusions with extensive passive atelectasis in the lower lungs bilaterally. Hepatobiliary: No definite suspicious cystic or solid hepatic lesions are noted on today's noncontrast CT  examination. Gallbladder is nearly decompressed, but otherwise unremarkable in appearance. Pancreas: No definite pancreatic mass or peripancreatic fluid or inflammatory changes noted on today's noncontrast CT examination. Spleen: Unremarkable. Adrenals/Urinary Tract: Unenhanced appearance of the kidneys and bilateral adrenal  glands are normal. Urinary bladder is normal in appearance. Stomach/Bowel: Normal appearance of the stomach. No pathologic dilatation of small bowel or colon. The appendix is not confidently identified and may be surgically absent. Regardless, there are no inflammatory changes noted adjacent to the cecum to suggest the presence of an acute appendicitis at this time. Vascular/Lymphatic: Aortic atherosclerosis. No lymphadenopathy noted in the abdomen or pelvis. Reproductive: Uterus and ovaries are atrophic. Small amount of gas in the endometrial canal. Other: Trace volume of ascites.  No pneumoperitoneum. Musculoskeletal: There are no aggressive appearing lytic or blastic lesions noted in the visualized portions of the skeleton. Status post ORIF in the right proximal femur. IMPRESSION: 1. Small amount of gas in the endometrial canal. This is of uncertain etiology and significance, but is abnormal, and could indicate endometritis with infection from gas-forming organisms, or could be related to recent procedure. Further clinical evaluation is recommended. 2. No definite source for blood loss confidently identified in the abdomen or pelvis. 3. Small volume of ascites. 4. Large left and moderate right pleural effusions with extensive passive atelectasis in the lower lobes of the lungs bilaterally. 5. Aortic atherosclerosis, in addition to left main and 3 vessel coronary artery disease. Assessment for potential risk factor modification, dietary therapy or pharmacologic therapy may be warranted, if clinically indicated. 6. Additional incidental findings, as above. Electronically Signed   By: Vinnie Langton M.D.   On: 07/30/2018 22:19   Dg Chest Portable 1 View  Result Date: 07/30/2018 CLINICAL DATA:  Hypotension. CHF. Diabetes. End-stage renal disease. EXAM: PORTABLE CHEST 1 VIEW COMPARISON:  09/08/2017 FINDINGS: Numerous leads and wires project over the chest. Right axillary vascular stent. Moderate cardiomegaly. Atherosclerosis in the transverse aorta. Increase in moderate left pleural effusion. Small right pleural effusion, increased. Congestive heart failure is moderate and increased. Worse on the left. Left greater than right base airspace disease, increased. Skin fold over the left hemithorax. IMPRESSION: Worsened aeration with progressive congestive heart failure and left greater than right pleural effusions. Left greater than right base airspace disease could represent atelectasis. Concurrent infection cannot be excluded. Aortic Atherosclerosis (ICD10-I70.0). Electronically Signed   By: Abigail Miyamoto M.D.   On: 07/30/2018 16:14    Impression:  1.  Anemia, hemoccult-positive. CT abdomen without contrast showed no hematoma or source of bleeding.  + air in endometrium of unclear significance. 2.  Altered mental status. 3.  Hypotension.  No overt GI bleeding, do not think low blood pressure is from GI bleeding. 4.  Chronic anticoagulation:  Aspirin and clopidigrel. 5.  Multiple medical problems:  CHF, history MI, history stroke.  Plan:  1.  Patient is in no shape for any type of endoscopic evaluation at the present time.  I doubt I would ever feel comfortable doing colonoscopy in absence of life-threatening circumstance.  If patient's mentation and blood pressure improves, since she may well have to go back on clopidigrel, I would consider doing an endoscopy, but at present risks of endoscopy far outweigh the risks.  If we get to that point, I would need cardiac clearance prior to consideration of endoscopy. 2.  Antiplatelets/anticoagulants on hold. 3.  PPI. 4.  Soft diet ok from GI  perspective, if patient's mental status allows. 5.  Serial CBCs, transfuse as needed. 6.  If no recent uterine instrumentation, would call GYN to see what (if anything) should be done about findings of air in endometrium on CT scan. 7.  [facility] will follow.   LOS: 1  day   Landry Dyke  07/31/2018, 11:47 AM  Cell [phone] If no answer or after 5 PM call [phone]

## 2018-07-31 NOTE — Patient Care Conference (Signed)
Patient's daughter called and updated. All questions answered.

## 2018-08-01 ENCOUNTER — Encounter (HOSPITAL_COMMUNITY): Payer: Self-pay | Admitting: General Practice

## 2018-08-01 LAB — COMPREHENSIVE METABOLIC PANEL
ALT: 81 U/L — ABNORMAL HIGH (ref 0–44)
AST: 36 U/L (ref 15–41)
Albumin: 2.4 g/dL — ABNORMAL LOW (ref 3.5–5.0)
Alkaline Phosphatase: 62 U/L (ref 38–126)
Anion gap: 14 (ref 5–15)
BUN: 94 mg/dL — ABNORMAL HIGH (ref 8–23)
CO2: 22 mmol/L (ref 22–32)
Calcium: 7.3 mg/dL — ABNORMAL LOW (ref 8.9–10.3)
Chloride: 105 mmol/L (ref 98–111)
Creatinine, Ser: 6.29 mg/dL — ABNORMAL HIGH (ref 0.44–1.00)
GFR calc Af Amer: 7 mL/min — ABNORMAL LOW (ref >60)
GFR calc non Af Amer: 6 mL/min — ABNORMAL LOW (ref >60)
Glucose, Bld: 95 mg/dL (ref 70–99)
Potassium: 4.3 mmol/L (ref 3.5–5.1)
Sodium: 141 mmol/L (ref 135–145)
Total Bilirubin: 0.9 mg/dL (ref 0.3–1.2)
Total Protein: 4.8 g/dL — ABNORMAL LOW (ref 6.5–8.1)

## 2018-08-01 LAB — CBC
HCT: 19.9 % — ABNORMAL LOW (ref 36.0–46.0)
Hemoglobin: 6.5 g/dL — CL (ref 12.0–15.0)
MCH: 30.4 pg (ref 26.0–34.0)
MCHC: 32.7 g/dL (ref 30.0–36.0)
MCV: 93 fL (ref 80.0–100.0)
Platelets: 121 10*3/uL — ABNORMAL LOW (ref 150–400)
RBC: 2.14 MIL/uL — ABNORMAL LOW (ref 3.87–5.11)
RDW: 17.7 % — ABNORMAL HIGH (ref 11.5–15.5)
WBC: 7.5 10*3/uL (ref 4.0–10.5)
nRBC: 0 % (ref 0.0–0.2)

## 2018-08-01 LAB — HEMOGLOBIN AND HEMATOCRIT, BLOOD
HCT: 26.5 % — ABNORMAL LOW (ref 36.0–46.0)
Hemoglobin: 8.9 g/dL — ABNORMAL LOW (ref 12.0–15.0)

## 2018-08-01 LAB — HEPATITIS B SURFACE ANTIGEN: Hepatitis B Surface Ag: NEGATIVE

## 2018-08-01 LAB — PREPARE RBC (CROSSMATCH)

## 2018-08-01 MED ORDER — SODIUM CHLORIDE 0.9% IV SOLUTION
Freq: Once | INTRAVENOUS | Status: AC
  Administered 2018-08-01: 03:00:00 via INTRAVENOUS

## 2018-08-01 MED ORDER — NEPRO/CARBSTEADY PO LIQD
237.0000 mL | Freq: Two times a day (BID) | ORAL | Status: DC
  Administered 2018-08-01: 237 mL via ORAL

## 2018-08-01 MED ORDER — HEPARIN SODIUM (PORCINE) 1000 UNIT/ML DIALYSIS: 1000.0000 [IU] | INTRAMUSCULAR | Status: DC | PRN

## 2018-08-01 MED ORDER — ALTEPLASE 2 MG IJ SOLR: 2.0000 mg | Freq: Once | INTRAMUSCULAR | Status: DC | PRN

## 2018-08-01 MED ORDER — ALBUMIN HUMAN 25 % IV SOLN
INTRAVENOUS | Status: AC
  Filled 2018-08-01: qty 100

## 2018-08-01 MED ORDER — SODIUM CHLORIDE 0.9 % IV SOLN: 100.0000 mL | INTRAVENOUS | Status: DC | PRN

## 2018-08-01 MED ORDER — SODIUM CHLORIDE 0.9 % IV BOLUS
500.0000 mL | Freq: Once | INTRAVENOUS | Status: AC
  Administered 2018-08-01: 500 mL via INTRAVENOUS

## 2018-08-01 MED ORDER — MORPHINE SULFATE (PF) 2 MG/ML IV SOLN
2.0000 mg | INTRAVENOUS | Status: DC | PRN
  Administered 2018-08-01: 02:00:00 2 mg via INTRAVENOUS
  Filled 2018-08-01: qty 1

## 2018-08-01 MED ORDER — LIDOCAINE-PRILOCAINE 2.5-2.5 % EX CREA: 1.0000 "application " | TOPICAL_CREAM | CUTANEOUS | Status: DC | PRN

## 2018-08-01 MED ORDER — RENA-VITE PO TABS
1.0000 | ORAL_TABLET | Freq: Every day | ORAL | Status: DC
  Administered 2018-08-01 – 2018-08-18 (×15): 1 via ORAL
  Filled 2018-08-01 (×19): qty 1

## 2018-08-01 MED ORDER — PENTAFLUOROPROP-TETRAFLUOROETH EX AERO: 1.0000 "application " | INHALATION_SPRAY | CUTANEOUS | Status: DC | PRN

## 2018-08-01 MED ORDER — ALBUMIN HUMAN 25 % IV SOLN
25.0000 g | Freq: Once | INTRAVENOUS | Status: AC
  Administered 2018-08-01: 25 g via INTRAVENOUS

## 2018-08-01 MED ORDER — LIDOCAINE HCL (PF) 1 % IJ SOLN: 5.0000 mL | INTRAMUSCULAR | Status: DC | PRN

## 2018-08-01 NOTE — Progress Notes (Addendum)
[facility] Progress Note    Assessment/ Plan:   78 year old BF with multiple medical problems including ESRD. Presents with weakness found to have hgb of 5.7 1hgb of 5.7- on 5/27 was 8.5 when she came for access procedure. Son denies overt bleeding. Do not have recent information from the kidney center about iron or ESA dosing. s/p  2 units on 6/6 .  Dosed with ESA.  GI not to consider invasive eval at this time- appropriate   2 ESRD:normally TTS at [facility] via [facility]. Last HD was Thursday. No acute need right now and no staff to run.  Seen after HD Monday  3 Hypertension:BP is low- looks like at home is on hydralazine and lasix- being held. Her volume looks fine right now, she tells me that she makes urine   5. Metabolic Bone Disease:no information and no labs- no bone active meds on home med list  Subjective:   Denies f/c/n/v/dyspnea.   Objective:   BP (!) 97/46 (BP Location: Left Arm)   Pulse 66   Temp (!) 97.3 F (36.3 C) (Oral)   Resp 11   Wt 33.9 kg   SpO2 100%   BMI 15.09 kg/m   Intake/Output Summary (Last 24 hours) at 08/01/2018 1435 Last data filed at 08/01/2018 4782 Gross per 24 hour  Intake 875.83 ml  Output -  Net 875.83 ml   Weight change:   Physical Exam: General: somnolent- arousable no c/o's  Heart: RRR- brady  Lungs: mostly clear Abdomen: nontender Extremities: really no edema Dialysis Access: upper arm lt AVF  Imaging: Ct Abdomen Pelvis Wo Contrast  Result Date: 07/30/2018 CLINICAL DATA:  78 year old female with history of diverticulosis. Acute in the media from suspected blood-loss. EXAM: CT ABDOMEN AND PELVIS WITHOUT CONTRAST TECHNIQUE: Multidetector CT imaging of the abdomen and pelvis was performed following the standard protocol without IV contrast. COMPARISON:  CT the abdomen and pelvis 05/26/2017. FINDINGS: Lower chest: Low-attenuation in the intravascular compartment suggestive of anemia. Atherosclerotic  calcifications in the thoracic aorta, as well as the left main, left anterior descending, left circumflex and right coronary arteries. Moderate right and large left pleural effusions with extensive passive atelectasis in the lower lungs bilaterally. Hepatobiliary: No definite suspicious cystic or solid hepatic lesions are noted on today's noncontrast CT examination. Gallbladder is nearly decompressed, but otherwise unremarkable in appearance. Pancreas: No definite pancreatic mass or peripancreatic fluid or inflammatory changes noted on today's noncontrast CT examination. Spleen: Unremarkable. Adrenals/Urinary Tract: Unenhanced appearance of the kidneys and bilateral adrenal glands are normal. Urinary bladder is normal in appearance. Stomach/Bowel: Normal appearance of the stomach. No pathologic dilatation of small bowel or colon. The appendix is not confidently identified and may be surgically absent. Regardless, there are no inflammatory changes noted adjacent to the cecum to suggest the presence of an acute appendicitis at this time. Vascular/Lymphatic: Aortic atherosclerosis. No lymphadenopathy noted in the abdomen or pelvis. Reproductive: Uterus and ovaries are atrophic. Small amount of gas in the endometrial canal. Other: Trace volume of ascites.  No pneumoperitoneum. Musculoskeletal: There are no aggressive appearing lytic or blastic lesions noted in the visualized portions of the skeleton. Status post ORIF in the right proximal femur. IMPRESSION: 1. Small amount of gas in the endometrial canal. This is of uncertain etiology and significance, but is abnormal, and could indicate endometritis with infection from gas-forming organisms, or could be related to recent procedure. Further clinical evaluation is recommended. 2. No definite source for blood loss confidently  identified in the abdomen or pelvis. 3. Small volume of ascites. 4. Large left and moderate right pleural effusions with extensive passive  atelectasis in the lower lobes of the lungs bilaterally. 5. Aortic atherosclerosis, in addition to left main and 3 vessel coronary artery disease. Assessment for potential risk factor modification, dietary therapy or pharmacologic therapy may be warranted, if clinically indicated. 6. Additional incidental findings, as above. Electronically Signed   By: Vinnie Langton M.D.   On: 07/30/2018 22:19   Dg Chest Portable 1 View  Result Date: 07/30/2018 CLINICAL DATA:  Hypotension. CHF. Diabetes. End-stage renal disease. EXAM: PORTABLE CHEST 1 VIEW COMPARISON:  09/08/2017 FINDINGS: Numerous leads and wires project over the chest. Right axillary vascular stent. Moderate cardiomegaly. Atherosclerosis in the transverse aorta. Increase in moderate left pleural effusion. Small right pleural effusion, increased. Congestive heart failure is moderate and increased. Worse on the left. Left greater than right base airspace disease, increased. Skin fold over the left hemithorax. IMPRESSION: Worsened aeration with progressive congestive heart failure and left greater than right pleural effusions. Left greater than right base airspace disease could represent atelectasis. Concurrent infection cannot be excluded. Aortic Atherosclerosis (ICD10-I70.0). Electronically Signed   By: Abigail Miyamoto M.D.   On: 07/30/2018 16:14    Labs: BMET Recent Labs  Lab 07/30/18 1406 08/01/18 0202  NA 142 141  K 3.6 4.3  CL 102 105  CO2 26 22  GLUCOSE 126* 95  BUN 64* 94*  CREATININE 5.37* 6.29*  CALCIUM 7.9* 7.3*   CBC Recent Labs  Lab 07/30/18 1406 07/30/18 2111 07/31/18 0913 08/01/18 0202 08/01/18 0912  WBC 7.3  --   --  7.5  --   NEUTROABS 5.9  --   --   --   --   HGB 5.7* 8.1* 7.4* 6.5* 8.9*  HCT 18.7* 24.8* 22.6* 19.9* 26.5*  MCV 97.4  --   --  93.0  --   PLT 145*  --   --  121*  --     Medications:    . sodium chloride   Intravenous Once  . Chlorhexidine Gluconate Cloth  6 each Topical Q0600  . darbepoetin  (ARANESP) injection - DIALYSIS  150 mcg Intravenous Q Mon-HD  . escitalopram  5 mg Oral Daily  . feeding supplement (NEPRO CARB STEADY)  237 mL Oral BID BM  . multivitamin  1 tablet Oral QHS  . Pancrealipase (Lip-Prot-Amyl) CPEP 6,000 units  1 capsule Oral TID WC  . pantoprazole (PROTONIX) IV  40 mg Intravenous Q24H      Otelia Santee, MD 08/01/2018, 2:35 PM

## 2018-08-01 NOTE — Progress Notes (Signed)
Subjective: Some bloody stools reported overnight. Patient seen in dialysis unit:  She can provide no information  Objective: Vital signs in last 24 hours: Temp:  [97.2 F (36.2 C)-97.9 F (36.6 C)] 97.3 F (36.3 C) (06/08 1025) Pulse Rate:  [48-69] 53 (06/08 1100) Resp:  [11-24] 11 (06/08 1025) BP: (80-120)/(38-80) 94/39 (06/08 1100) SpO2:  [93 %-100 %] 100 % (06/08 1025) Weight:  [33.9 kg] 33.9 kg (06/08 1025) Weight change:  Last BM Date: 08/01/18  PE: GEN:  Awake, thin-, frail- and chronically ill-appearing, can't answer questions cogently ABD:  Soft, non-tender  Lab Results: CBC    Component Value Date/Time   WBC 7.5 08/01/2018 0202   RBC 2.14 (L) 08/01/2018 0202   HGB 8.9 (L) 08/01/2018 0912   HCT 26.5 (L) 08/01/2018 0912   PLT 121 (L) 08/01/2018 0202   MCV 93.0 08/01/2018 0202   MCH 30.4 08/01/2018 0202   MCHC 32.7 08/01/2018 0202   RDW 17.7 (H) 08/01/2018 0202   LYMPHSABS 0.7 07/30/2018 1406   MONOABS 0.6 07/30/2018 1406   EOSABS 0.0 07/30/2018 1406   BASOSABS 0.0 07/30/2018 1406   CMP     Component Value Date/Time   NA 141 08/01/2018 0202   K 4.3 08/01/2018 0202   CL 105 08/01/2018 0202   CO2 22 08/01/2018 0202   GLUCOSE 95 08/01/2018 0202   BUN 94 (H) 08/01/2018 0202   CREATININE 6.29 (H) 08/01/2018 0202   CREATININE 3.15 (H) 02/10/2017 1139   CALCIUM 7.3 (L) 08/01/2018 0202   PROT 4.8 (L) 08/01/2018 0202   ALBUMIN 2.4 (L) 08/01/2018 0202   AST 36 08/01/2018 0202   ALT 81 (H) 08/01/2018 0202   ALKPHOS 62 08/01/2018 0202   BILITOT 0.9 08/01/2018 0202   GFRNONAA 6 (L) 08/01/2018 0202   GFRAA 7 (L) 08/01/2018 0202   Assessment:  1.  Anemia, hemoccult-positive. Reportedly with some blood in stool overnight.  CT abdomen without contrast showed no hematoma or source of bleeding.  2.  Altered mental status. 3.  Hypotension.  No overt GI bleeding, do not think low blood pressure is from GI bleeding. 4.  Chronic anticoagulation:  Aspirin and  clopidigrel. 5.  Multiple medical problems:  CHF, history MI, history stroke.  Plan:  1.  Patient is very poor endoscopy candidate; anticipate at least 25% mortality rate if endoscopic evaluation done at the present time. 2.  As such, would stay off antiplatelet/anticoagulants, follow CBCs, transfuse as needed, PPI. 3.  If further bleeding, would consider tagged RBC study as next step in management. 4.  If endoscopic evaluation ultimately considered, which I'd like to avoid for reasons as above, then would need cardiology clearance and frank discussion with family about significant mortality risks from the procedure. 5.  [facility] will follow.   Landry Dyke 08/01/2018, 11:54 AM   Cell [phone] If no answer or after 5 PM call [phone]

## 2018-08-01 NOTE — Progress Notes (Signed)
PROGRESS NOTE    Courtney [name]  EZM:[id number] DOB: [birth year] DOA: 07/30/2018 PCP: Neale Burly, MD    Brief Narrative:  78 y.o. female with medical history significant of ESRD ON HD, TThsat, diastolic heart failure, stroke, CAD , DM, hypertension, hyperlipidemia presents to ED from HD for hypotension. Minimal history from the patient, her son at bedside and reports she has been lethargic, slightly confused and weak. On arrival she was afebrile and hypotensive with bp 70/40's. Labs significant for hemoglobin of 5.7 , normal wbc count of 7.3 , creatinine of 5.37, alk phos of 64, AST of 53, ALT of 113. Dr Paulita Fujita from GI consulted, and nephrology on board.  She was referred to medical service for admission for symptomatic anemia.  2 units of prbc transfusion ordered.   Assessment & Plan:   Active Problems:   Symptomatic anemia  Symptomatic anemia  -2 units of prbc ordered with hgb from 5.7, currently 7.4 -Transfuse to keep hemoglobin greater than 7.  -Stool for occult blood is positive.  -CT abd and pelvis without contrast ordered and personally reviewed. Findings notable for small amount of gas in the endometrial canal of uncertain etiology. Also findings of large L and moderate R pleural effusions with extensive passive atelectasis -Recently discussed with GYN on call regarding above CT finding who does not recommend further work up of above endometrial gas. Pt afebrile with no leukocytosis - aspirin and plavix remains on hold. -GI following. Initially no plans for endoscopy given frailty. Overnight events noted. Patient with three bloody bowel movements noted overnight with hgb of 6.5, s/p transfusion. Post-transfusion hgb of 8.9  ESRD on HD; -Nephrology following -Pt undergoing HD today  CAD: -Continuing to hold aspirin and plavix.  -Denies chest pain at this time  Hyperlipidemia: -Continuing to hold atorvastatin.  -Stable at this time  Hypotension:  -Resolved with 500  NS bolus.  -Continue holding hypertensive meds    DVT prophylaxis: SCD's Code Status: Full Family Communication: Pt in room, family not at bedside Disposition Plan: Uncertain at this time  Consultants:   GI  Nephrology  Discussed with on-call GYN over phone  Procedures:     Antimicrobials: Anti-infectives (From admission, onward)   None      Subjective: Seems tired this AM  Objective: Vitals:   08/01/18 0918 08/01/18 1025 08/01/18 1035 08/01/18 1100  BP: (!) 98/38 (!) 97/43 (!) 87/60 (!) 94/39  Pulse:  (!) 57 (!) 48 (!) 53  Resp: 12 11    Temp:  (!) 97.3 F (36.3 C)    TempSrc:  Oral    SpO2:  100%    Weight:  33.9 kg      Intake/Output Summary (Last 24 hours) at 08/01/2018 1123 Last data filed at 08/01/2018 5035 Gross per 24 hour  Intake 1004.88 ml  Output --  Net 1004.88 ml   Filed Weights   08/01/18 1025  Weight: 33.9 kg    Examination: General exam: Awake, laying in bed, in nad Respiratory system: Normal respiratory effort, no wheezing Cardiovascular system: regular rate, s1, s2 Gastrointestinal system: Soft, nondistended, positive BS Central nervous system: CN2-12 grossly intact, strength intact Extremities: Perfused, no clubbing Skin: Normal skin turgor, no notable skin lesions seen Psychiatry: Mood normal // no visual hallucinations   Data Reviewed: I have personally reviewed following labs and imaging studies  CBC: Recent Labs  Lab 07/30/18 1406 07/30/18 2111 07/31/18 0913 08/01/18 0202 08/01/18 0912  WBC 7.3  --   --  7.5  --   NEUTROABS 5.9  --   --   --   --   HGB 5.7* 8.1* 7.4* 6.5* 8.9*  HCT 18.7* 24.8* 22.6* 19.9* 26.5*  MCV 97.4  --   --  93.0  --   PLT 145*  --   --  121*  --    Basic Metabolic Panel: Recent Labs  Lab 07/30/18 1406 08/01/18 0202  NA 142 141  K 3.6 4.3  CL 102 105  CO2 26 22  GLUCOSE 126* 95  BUN 64* 94*  CREATININE 5.37* 6.29*  CALCIUM 7.9* 7.3*   GFR: Estimated Creatinine Clearance: 4.1  mL/min (A) (by C-G formula based on SCr of 6.29 mg/dL (H)). Liver Function Tests: Recent Labs  Lab 07/30/18 1406 08/01/18 0202  AST 53* 36  ALT 113* 81*  ALKPHOS 64 62  BILITOT 0.6 0.9  PROT 5.2* 4.8*  ALBUMIN 2.5* 2.4*   No results for input(s): LIPASE, AMYLASE in the last 168 hours. No results for input(s): AMMONIA in the last 168 hours. Coagulation Profile: No results for input(s): INR, PROTIME in the last 168 hours. Cardiac Enzymes: No results for input(s): CKTOTAL, CKMB, CKMBINDEX, TROPONINI in the last 168 hours. BNP (last 3 results) No results for input(s): PROBNP in the last 8760 hours. HbA1C: No results for input(s): HGBA1C in the last 72 hours. CBG: No results for input(s): GLUCAP in the last 168 hours. Lipid Profile: No results for input(s): CHOL, HDL, LDLCALC, TRIG, CHOLHDL, LDLDIRECT in the last 72 hours. Thyroid Function Tests: No results for input(s): TSH, T4TOTAL, FREET4, T3FREE, THYROIDAB in the last 72 hours. Anemia Panel: Recent Labs    07/31/18 0336  VITAMINB12 1,571*  FOLATE 77.4  FERRITIN 1,324*  TIBC 136*  IRON 111  RETICCTPCT 2.1   Sepsis Labs: No results for input(s): PROCALCITON, LATICACIDVEN in the last 168 hours.  Recent Results (from the past 240 hour(s))  SARS Coronavirus 2 (CEPHEID - Performed in [facility] hospital lab), Hosp Order     Status: None   Collection Time: 07/30/18  6:52 PM  Result Value Ref Range Status   SARS Coronavirus 2 NEGATIVE NEGATIVE Final    Comment: (NOTE) If result is NEGATIVE SARS-CoV-2 target nucleic acids are NOT DETECTED. The SARS-CoV-2 RNA is generally detectable in upper and lower  respiratory specimens during the acute phase of infection. The lowest  concentration of SARS-CoV-2 viral copies this assay can detect is 250  copies / mL. A negative result does not preclude SARS-CoV-2 infection  and should not be used as the sole basis for treatment or other  patient management decisions.  A negative  result may occur with  improper specimen collection / handling, submission of specimen other  than nasopharyngeal swab, presence of viral mutation(s) within the  areas targeted by this assay, and inadequate number of viral copies  (<250 copies / mL). A negative result must be combined with clinical  observations, patient history, and epidemiological information. If result is POSITIVE SARS-CoV-2 target nucleic acids are DETECTED. The SARS-CoV-2 RNA is generally detectable in upper and lower  respiratory specimens dur ing the acute phase of infection.  Positive  results are indicative of active infection with SARS-CoV-2.  Clinical  correlation with patient history and other diagnostic information is  necessary to determine patient infection status.  Positive results do  not rule out bacterial infection or co-infection with other viruses. If result is PRESUMPTIVE POSTIVE SARS-CoV-2 nucleic acids MAY BE PRESENT.   A  presumptive positive result was obtained on the submitted specimen  and confirmed on repeat testing.  While 2019 novel coronavirus  (SARS-CoV-2) nucleic acids may be present in the submitted sample  additional confirmatory testing may be necessary for epidemiological  and / or clinical management purposes  to differentiate between  SARS-CoV-2 and other Sarbecovirus currently known to infect humans.  If clinically indicated additional testing with an alternate test  methodology [phone]) is advised. The SARS-CoV-2 RNA is generally  detectable in upper and lower respiratory sp ecimens during the acute  phase of infection. The expected result is Negative. Fact Sheet for Patients:  StrictlyIdeas.no Fact Sheet for Healthcare Providers: BankingDealers.co.za This test is not yet approved or cleared by the [place] FDA and has been authorized for detection and/or diagnosis of SARS-CoV-2 by FDA under an Emergency Use Authorization  (EUA).  This EUA will remain in effect (meaning this test can be used) for the duration of the COVID-19 declaration under Section 564(b)(1) of the Act, 21 U.S.C. section 360bbb-3(b)(1), unless the authorization is terminated or revoked sooner. Performed at [facility], [place] [address], [place], [place] [address]      Radiology Studies: Ct Abdomen Pelvis Wo Contrast  Result Date: 07/30/2018 CLINICAL DATA:  78 year old female with history of diverticulosis. Acute in the media from suspected blood-loss. EXAM: CT ABDOMEN AND PELVIS WITHOUT CONTRAST TECHNIQUE: Multidetector CT imaging of the abdomen and pelvis was performed following the standard protocol without IV contrast. COMPARISON:  CT the abdomen and pelvis 05/26/2017. FINDINGS: Lower chest: Low-attenuation in the intravascular compartment suggestive of anemia. Atherosclerotic calcifications in the thoracic aorta, as well as the left main, left anterior descending, left circumflex and right coronary arteries. Moderate right and large left pleural effusions with extensive passive atelectasis in the lower lungs bilaterally. Hepatobiliary: No definite suspicious cystic or solid hepatic lesions are noted on today's noncontrast CT examination. Gallbladder is nearly decompressed, but otherwise unremarkable in appearance. Pancreas: No definite pancreatic mass or peripancreatic fluid or inflammatory changes noted on today's noncontrast CT examination. Spleen: Unremarkable. Adrenals/Urinary Tract: Unenhanced appearance of the kidneys and bilateral adrenal glands are normal. Urinary bladder is normal in appearance. Stomach/Bowel: Normal appearance of the stomach. No pathologic dilatation of small bowel or colon. The appendix is not confidently identified and may be surgically absent. Regardless, there are no inflammatory changes noted adjacent to the cecum to suggest the presence of an acute appendicitis at this time. Vascular/Lymphatic: Aortic  atherosclerosis. No lymphadenopathy noted in the abdomen or pelvis. Reproductive: Uterus and ovaries are atrophic. Small amount of gas in the endometrial canal. Other: Trace volume of ascites.  No pneumoperitoneum. Musculoskeletal: There are no aggressive appearing lytic or blastic lesions noted in the visualized portions of the skeleton. Status post ORIF in the right proximal femur. IMPRESSION: 1. Small amount of gas in the endometrial canal. This is of uncertain etiology and significance, but is abnormal, and could indicate endometritis with infection from gas-forming organisms, or could be related to recent procedure. Further clinical evaluation is recommended. 2. No definite source for blood loss confidently identified in the abdomen or pelvis. 3. Small volume of ascites. 4. Large left and moderate right pleural effusions with extensive passive atelectasis in the lower lobes of the lungs bilaterally. 5. Aortic atherosclerosis, in addition to left main and 3 vessel coronary artery disease. Assessment for potential risk factor modification, dietary therapy or pharmacologic therapy may be warranted, if clinically indicated. 6. Additional incidental findings, as above. Electronically Signed  By: Vinnie Langton M.D.   On: 07/30/2018 22:19   Dg Chest Portable 1 View  Result Date: 07/30/2018 CLINICAL DATA:  Hypotension. CHF. Diabetes. End-stage renal disease. EXAM: PORTABLE CHEST 1 VIEW COMPARISON:  09/08/2017 FINDINGS: Numerous leads and wires project over the chest. Right axillary vascular stent. Moderate cardiomegaly. Atherosclerosis in the transverse aorta. Increase in moderate left pleural effusion. Small right pleural effusion, increased. Congestive heart failure is moderate and increased. Worse on the left. Left greater than right base airspace disease, increased. Skin fold over the left hemithorax. IMPRESSION: Worsened aeration with progressive congestive heart failure and left greater than right pleural  effusions. Left greater than right base airspace disease could represent atelectasis. Concurrent infection cannot be excluded. Aortic Atherosclerosis (ICD10-I70.0). Electronically Signed   By: Abigail Miyamoto M.D.   On: 07/30/2018 16:14    Scheduled Meds:  sodium chloride   Intravenous Once   Chlorhexidine Gluconate Cloth  6 each Topical Q0600   darbepoetin (ARANESP) injection - DIALYSIS  150 mcg Intravenous Q Mon-HD   escitalopram  5 mg Oral Daily   Pancrealipase (Lip-Prot-Amyl) CPEP 6,000 units  1 capsule Oral TID WC   pantoprazole (PROTONIX) IV  40 mg Intravenous Q24H   Continuous Infusions:  sodium chloride     sodium chloride       LOS: 2 days   Marylu Lund, MD [facility] Pager On Amion  If 7PM-7AM, please contact night-coverage 08/01/2018, 11:23 AM

## 2018-08-01 NOTE — Progress Notes (Signed)
Patient has three blood bowel movement and low blood pressure 93/62mmhg then notified the on call provider Bodenheimer and rapid response. Given normal saline bolus 500 ml  and started blood transfusion as per order. Patient's hemoglobin was 6.5 before start the  Blood.will continue monitor the patient.

## 2018-08-01 NOTE — Progress Notes (Signed)
CRITICAL VALUE ALERT  Critical Value:  Hemoglobin 6.5  Date & Time Notied:  08/01/2018 [id number]  Provider Notified: Bodenheimer  Orders Received/Actions taken: transfuse one unit of RBC

## 2018-08-01 NOTE — Progress Notes (Signed)
Approxiamately 1.5 hours into dialysis treatment b/p noted to be 80/37.  UF paused and Dr. Augustin Coupe notified.  Orders received to give Albumin 25g and discontinue UF for the remainder of the treatment.  Patient current b/p reading post Albumin 25g is 98/48.

## 2018-08-01 NOTE — Progress Notes (Signed)
Renal Navigator notified OP HD clinic/[facility] of patient's admission and negative COVID 19 rapid test result in order to provide continuity of care and safety.  Alphonzo Cruise, [place] Renal Navigator [phone]

## 2018-08-01 NOTE — Progress Notes (Signed)
Initial Nutrition Assessment  RD working remotely.  DOCUMENTATION CODES:   Underweight  INTERVENTION:   -Nepro Shake po BID, each supplement provides 425 kcal and 19 grams protein -Renal MVI daily -Downgrade diet to dysphagia 2 (mechanical soft) diet for ease of intake  NUTRITION DIAGNOSIS:   Inadequate oral intake related to decreased appetite as evidenced by meal completion < 25%.  GOAL:   Patient will meet greater than or equal to 90% of their needs  MONITOR:   PO intake, Supplement acceptance, Labs, Weight trends, Skin, I & O's  REASON FOR ASSESSMENT:   Malnutrition Screening Tool    ASSESSMENT:   Courtney [name] is a 78 y.o. female with medical history significant of ESRD ON HD, TThsat, diastolic heart failure, stroke, CAD , DM, hypertension, hyperlipidemia presents to ED from HD for hypotension. Minimal history from the patient, her son at bedside and reports she has been lethargic, slightly confused and weak since yesterday. No bloody stools at home.   Pt admitted with symptomatic anemia.   Reviewed I/O's: +1 L x 24 hours and +2.3 L since admission  Per GI notes, pt with heme positive stool, but not considering invasive interventions.   Pt on HD unit and has been unable to provide history per chart review. RD unable to obtain further nutriton-related history at this time.   Reviewed wt hx from [facility]; noted wt of 69# (31.3 kg) on 10/05/18. Unsure of dry wt.   Noted documented meal completion 0%. Per nutrition hx screen, pt consumes a mechanical soft diet at home.RD will downgrade to mechanical soft diet for ease of intake.   Given underweight status and wt fluctuations, RD suspects pt with malnutrition, however, unable to identify without further nutrition history or completion of nutrition-focused physical exam.   Medications reviewed and include pancrealipase.   Labs reviewed.   NUTRITION - FOCUSED PHYSICAL EXAM:    Most Recent Value  Orbital Region   Unable to assess  Upper Arm Region  Unable to assess  Thoracic and Lumbar Region  Unable to assess  Buccal Region  Unable to assess  Temple Region  Unable to assess  Clavicle Bone Region  Unable to assess  Clavicle and Acromion Bone Region  Unable to assess  Scapular Bone Region  Unable to assess  Dorsal Hand  Unable to assess  Patellar Region  Unable to assess  Anterior Thigh Region  Unable to assess  Posterior Calf Region  Unable to assess  Edema (RD Assessment)  Unable to assess  Hair  Unable to assess  Eyes  Unable to assess  Mouth  Unable to assess  Skin  Unable to assess  Nails  Unable to assess       Diet Order:   Diet Order            Diet renal with fluid restriction Fluid restriction: 1200 mL Fluid; Room service appropriate? No; Fluid consistency: Thin  Diet effective now              EDUCATION NEEDS:   No education needs have been identified at this time  Skin:  Skin Assessment: Reviewed RN Assessment  Last BM:  08/01/18  Height:   Ht Readings from Last 1 Encounters:  07/20/18 4\' 11"  (1.499 m)    Weight:   Wt Readings from Last 1 Encounters:  08/01/18 33.9 kg    Ideal Body Weight:  44.5 kg  BMI:  Body mass index is 15.09 kg/m.  Estimated Nutritional Needs:  Kcal:  1350-1550  Protein:  65-80 grams  Fluid:  1016ml + UOP    Roen Macgowan A. Jimmye Norman, RD, LDN, [place] Registered Dietitian II Certified Diabetes Care and Education Specialist Pager: [phone] After hours Pager: [phone]

## 2018-08-02 LAB — CBC
HCT: 25.1 % — ABNORMAL LOW (ref 36.0–46.0)
Hemoglobin: 8.2 g/dL — ABNORMAL LOW (ref 12.0–15.0)
MCH: 29.3 pg (ref 26.0–34.0)
MCHC: 32.7 g/dL (ref 30.0–36.0)
MCV: 89.6 fL (ref 80.0–100.0)
Platelets: 88 10*3/uL — ABNORMAL LOW (ref 150–400)
RBC: 2.8 MIL/uL — ABNORMAL LOW (ref 3.87–5.11)
RDW: 18.4 % — ABNORMAL HIGH (ref 11.5–15.5)
WBC: 6.5 10*3/uL (ref 4.0–10.5)
nRBC: 0 % (ref 0.0–0.2)

## 2018-08-02 LAB — MRSA PCR SCREENING: MRSA by PCR: NEGATIVE

## 2018-08-02 MED ORDER — ORAL CARE MOUTH RINSE
15.0000 mL | Freq: Two times a day (BID) | OROMUCOSAL | Status: DC
  Administered 2018-08-02 – 2018-08-04 (×3): 15 mL via OROMUCOSAL

## 2018-08-02 NOTE — Patient Care Conference (Signed)
Called and updated patient's daughter. All questions answered 

## 2018-08-02 NOTE — Progress Notes (Signed)
Patient had two blackish brown bowel movements this shift, one this morning and one in the evening. Also vomited about 300 mL of tan liquid, relieved with zofran. Will continue to monitor.

## 2018-08-02 NOTE — Progress Notes (Signed)
PROGRESS NOTE    Courtney [name]  NWG:[id number] DOB: [birth year] DOA: 07/30/2018 PCP: Neale Burly, MD    Brief Narrative:  78 y.o. female with medical history significant of ESRD ON HD, TThsat, diastolic heart failure, stroke, CAD , DM, hypertension, hyperlipidemia presents to ED from HD for hypotension. Minimal history from the patient, her son at bedside and reports she has been lethargic, slightly confused and weak. On arrival she was afebrile and hypotensive with bp 70/40's. Labs significant for hemoglobin of 5.7 , normal wbc count of 7.3 , creatinine of 5.37, alk phos of 64, AST of 53, ALT of 113. Dr Paulita Fujita from GI consulted, and nephrology on board.  She was referred to medical service for admission for symptomatic anemia.  2 units of prbc transfusion ordered.   Assessment & Plan:   Active Problems:   Symptomatic anemia  Symptomatic anemia  -2 units of prbc ordered with hgb from 5.7, currently 7.4 -Transfuse to keep hemoglobin greater than 7.  -Stool for occult blood is positive.  -CT abd and pelvis without contrast ordered and personally reviewed. Findings notable for small amount of gas in the endometrial canal of uncertain etiology. Also findings of large L and moderate R pleural effusions with extensive passive atelectasis -Recently discussed with GYN on call regarding above CT finding who does not recommend further work up of above endometrial gas. Pt afebrile with no leukocytosis - aspirin and plavix remains on hold. -GI had been following. No plans for endoscopy given frailty and high risk. Pt recently noted to have bloody BM requiring blood tx. Discussed with GI, still no plans for endoscopy. Cont to hold ASA and plavix, consideration for resuming ASA alone when discharged -Pt noted to have blood in stool this AM. Hgb remains stable. Possible old blood. Will repeat CBC. If continued bleed or if hgb trending down, would then re-discuss with GI  ESRD on HD; -Nephrology  following -Pt to continue HD as tolerated  CAD: -Continuing to hold aspirin and plavix per above -Denies chest pain at this time  Hyperlipidemia: -Continuing to hold atorvastatin.  -Remains stable at this time  Hypotension:  -BP stable and controlled -Pt historically has labile BP related to HD  DVT prophylaxis: SCD's Code Status: Full Family Communication: Pt in room, family not at bedside Disposition Plan: Uncertain at this time  Consultants:   GI  Nephrology  Discussed with on-call GYN over phone  Procedures:     Antimicrobials: Anti-infectives (From admission, onward)   None      Subjective: Without complaints at this time  Objective: Vitals:   08/02/18 0225 08/02/18 0230 08/02/18 0632 08/02/18 0720  BP:   (!) 101/44   Pulse:      Resp:   10   Temp: 98 F (36.7 C)  (!) 97.3 F (36.3 C) (!) 96.7 F (35.9 C)  TempSrc: Oral  Axillary Axillary  SpO2:   99%   Weight:  36.6 kg    Height:        Intake/Output Summary (Last 24 hours) at 08/02/2018 1227 Last data filed at 08/01/2018 2300 Gross per 24 hour  Intake 60 ml  Output -109 ml  Net 169 ml   Filed Weights   08/01/18 1025 08/01/18 1405 08/02/18 0230  Weight: 33.9 kg 34 kg 36.6 kg    Examination: General exam: Conversant, in no acute distress Respiratory system: normal chest rise, clear, no audible wheezing Cardiovascular system: regular rhythm, s1-s2 Gastrointestinal system: Nondistended, nontender, pos BS  Central nervous system: No seizures, no tremors Extremities: No cyanosis, no joint deformities Skin: No rashes, no pallor Psychiatry: Affect normal // no auditory hallucinations   Data Reviewed: I have personally reviewed following labs and imaging studies  CBC: Recent Labs  Lab 07/30/18 1406 07/30/18 2111 07/31/18 0913 08/01/18 0202 08/01/18 0912 08/02/18 0332  WBC 7.3  --   --  7.5  --  6.5  NEUTROABS 5.9  --   --   --   --   --   HGB 5.7* 8.1* 7.4* 6.5* 8.9* 8.2*  HCT  18.7* 24.8* 22.6* 19.9* 26.5* 25.1*  MCV 97.4  --   --  93.0  --  89.6  PLT 145*  --   --  121*  --  88*   Basic Metabolic Panel: Recent Labs  Lab 07/30/18 1406 08/01/18 0202  NA 142 141  K 3.6 4.3  CL 102 105  CO2 26 22  GLUCOSE 126* 95  BUN 64* 94*  CREATININE 5.37* 6.29*  CALCIUM 7.9* 7.3*   GFR: Estimated Creatinine Clearance: 4.4 mL/min (A) (by C-G formula based on SCr of 6.29 mg/dL (H)). Liver Function Tests: Recent Labs  Lab 07/30/18 1406 08/01/18 0202  AST 53* 36  ALT 113* 81*  ALKPHOS 64 62  BILITOT 0.6 0.9  PROT 5.2* 4.8*  ALBUMIN 2.5* 2.4*   No results for input(s): LIPASE, AMYLASE in the last 168 hours. No results for input(s): AMMONIA in the last 168 hours. Coagulation Profile: No results for input(s): INR, PROTIME in the last 168 hours. Cardiac Enzymes: No results for input(s): CKTOTAL, CKMB, CKMBINDEX, TROPONINI in the last 168 hours. BNP (last 3 results) No results for input(s): PROBNP in the last 8760 hours. HbA1C: No results for input(s): HGBA1C in the last 72 hours. CBG: No results for input(s): GLUCAP in the last 168 hours. Lipid Profile: No results for input(s): CHOL, HDL, LDLCALC, TRIG, CHOLHDL, LDLDIRECT in the last 72 hours. Thyroid Function Tests: No results for input(s): TSH, T4TOTAL, FREET4, T3FREE, THYROIDAB in the last 72 hours. Anemia Panel: Recent Labs    07/31/18 0336  VITAMINB12 1,571*  FOLATE 77.4  FERRITIN 1,324*  TIBC 136*  IRON 111  RETICCTPCT 2.1   Sepsis Labs: No results for input(s): PROCALCITON, LATICACIDVEN in the last 168 hours.  Recent Results (from the past 240 hour(s))  SARS Coronavirus 2 (CEPHEID - Performed in [place] hospital lab), Hosp Order     Status: None   Collection Time: 07/30/18  6:52 PM  Result Value Ref Range Status   SARS Coronavirus 2 NEGATIVE NEGATIVE Final    Comment: (NOTE) If result is NEGATIVE SARS-CoV-2 target nucleic acids are NOT DETECTED. The SARS-CoV-2 RNA is generally  detectable in upper and lower  respiratory specimens during the acute phase of infection. The lowest  concentration of SARS-CoV-2 viral copies this assay can detect is 250  copies / mL. A negative result does not preclude SARS-CoV-2 infection  and should not be used as the sole basis for treatment or other  patient management decisions.  A negative result may occur with  improper specimen collection / handling, submission of specimen other  than nasopharyngeal swab, presence of viral mutation(s) within the  areas targeted by this assay, and inadequate number of viral copies  (<250 copies / mL). A negative result must be combined with clinical  observations, patient history, and epidemiological information. If result is POSITIVE SARS-CoV-2 target nucleic acids are DETECTED. The SARS-CoV-2 RNA is generally detectable  in upper and lower  respiratory specimens dur ing the acute phase of infection.  Positive  results are indicative of active infection with SARS-CoV-2.  Clinical  correlation with patient history and other diagnostic information is  necessary to determine patient infection status.  Positive results do  not rule out bacterial infection or co-infection with other viruses. If result is PRESUMPTIVE POSTIVE SARS-CoV-2 nucleic acids MAY BE PRESENT.   A presumptive positive result was obtained on the submitted specimen  and confirmed on repeat testing.  While 2019 novel coronavirus  (SARS-CoV-2) nucleic acids may be present in the submitted sample  additional confirmatory testing may be necessary for epidemiological  and / or clinical management purposes  to differentiate between  SARS-CoV-2 and other Sarbecovirus currently known to infect humans.  If clinically indicated additional testing with an alternate test  methodology [phone]) is advised. The SARS-CoV-2 RNA is generally  detectable in upper and lower respiratory sp ecimens during the acute  phase of infection. The  expected result is Negative. Fact Sheet for Patients:  StrictlyIdeas.no Fact Sheet for Healthcare Providers: BankingDealers.co.za This test is not yet approved or cleared by the [place] FDA and has been authorized for detection and/or diagnosis of SARS-CoV-2 by FDA under an Emergency Use Authorization (EUA).  This EUA will remain in effect (meaning this test can be used) for the duration of the COVID-19 declaration under Section 564(b)(1) of the Act, 21 U.S.C. section 360bbb-3(b)(1), unless the authorization is terminated or revoked sooner. Performed at [facility], [place] [address], [place], [place] [address]   MRSA PCR Screening     Status: None   Collection Time: 08/01/18 10:53 PM  Result Value Ref Range Status   MRSA by PCR NEGATIVE NEGATIVE Final    Comment:        The GeneXpert MRSA Assay (FDA approved for NASAL specimens only), is one component of a comprehensive MRSA colonization surveillance program. It is not intended to diagnose MRSA infection nor to guide or monitor treatment for MRSA infections. Performed at [facility], [name] [address], [place], [place] [address]      Radiology Studies: No results found.  Scheduled Meds:  sodium chloride   Intravenous Once   Chlorhexidine Gluconate Cloth  6 each Topical Q0600   darbepoetin (ARANESP) injection - DIALYSIS  150 mcg Intravenous Q Mon-HD   escitalopram  5 mg Oral Daily   feeding supplement (NEPRO CARB STEADY)  237 mL Oral BID BM   mouth rinse  15 mL Mouth Rinse BID   multivitamin  1 tablet Oral QHS   Pancrealipase (Lip-Prot-Amyl) CPEP 6,000 units  1 capsule Oral TID WC   pantoprazole (PROTONIX) IV  40 mg Intravenous Q24H   Continuous Infusions:    LOS: 3 days   Marylu Lund, MD [facility] Pager On Amion  If 7PM-7AM, please contact night-coverage 08/02/2018, 12:27 PM

## 2018-08-02 NOTE — Care Management Important Message (Signed)
Important Message  Patient Details  Name: Courtney [name] MRN: [id number] Date of Birth: 1941/01/06   Medicare Important Message Given:  Yes Verbal consent obtained due to national emergency signed by case Manager Assistant   Orbie Pyo 08/02/2018, 3:14 PM

## 2018-08-02 NOTE — Progress Notes (Signed)
Subjective: Feels ok today. No abdominal pain. Denies blood in stool.  Objective: Vital signs in last 24 hours: Temp:  [96.2 F (35.7 C)-98 F (36.7 C)] 96.7 F (35.9 C) (06/09 0720) Pulse Rate:  [43-68] 61 (06/09 0000) Resp:  [10-15] 10 (06/09 0632) BP: (80-126)/(30-60) 101/44 (06/09 0632) SpO2:  [98 %-100 %] 99 % (06/09 0632) Weight:  [34 kg-36.6 kg] 36.6 kg (06/09 0230) Weight change:  Last BM Date: 08/01/18  PE: GEN:  More alert today, can answer questions better, frail-, chronically ill-appearing ABD:  Soft, non-tender  Lab Results: CBC    Component Value Date/Time   WBC 6.5 08/02/2018 0332   RBC 2.80 (L) 08/02/2018 0332   HGB 8.2 (L) 08/02/2018 0332   HCT 25.1 (L) 08/02/2018 0332   PLT 88 (L) 08/02/2018 0332   MCV 89.6 08/02/2018 0332   MCH 29.3 08/02/2018 0332   MCHC 32.7 08/02/2018 0332   RDW 18.4 (H) 08/02/2018 0332   LYMPHSABS 0.7 07/30/2018 1406   MONOABS 0.6 07/30/2018 1406   EOSABS 0.0 07/30/2018 1406   BASOSABS 0.0 07/30/2018 1406   CMP     Component Value Date/Time   NA 141 08/01/2018 0202   K 4.3 08/01/2018 0202   CL 105 08/01/2018 0202   CO2 22 08/01/2018 0202   GLUCOSE 95 08/01/2018 0202   BUN 94 (H) 08/01/2018 0202   CREATININE 6.29 (H) 08/01/2018 0202   CREATININE 3.15 (H) 02/10/2017 1139   CALCIUM 7.3 (L) 08/01/2018 0202   PROT 4.8 (L) 08/01/2018 0202   ALBUMIN 2.4 (L) 08/01/2018 0202   AST 36 08/01/2018 0202   ALT 81 (H) 08/01/2018 0202   ALKPHOS 62 08/01/2018 0202   BILITOT 0.9 08/01/2018 0202   GFRNONAA 6 (L) 08/01/2018 0202   GFRAA 7 (L) 08/01/2018 0202   Assessment:  1. Anemia, hemoccult-positive. Reportedly with some blood in stool two days ago, none since.  CT abdomen without contrast showed no hematoma or source of bleeding.  2. Altered mental status, improving. 3. Hypotension, improving. No overt GI bleeding, do not think low blood pressure is from GI bleeding. 4. Chronic anticoagulation: Aspirin and  clopidigrel. 5. Multiple medical problems: CHF, history MI, history stroke.  Plan:  1.  No endoscopic evaluation in absence of life-threatening exsanguination, in light of patient's multiple comorbidities. 2.  PPI chronically, now and indefinitely upon discharge. 3.  Would consider long-term cessation of clopidigrel, but aspirin is ok. 4.  Case discussed with [place] team (Dr. Wyline Copas).   5.  [facility] will sign-off; please call with questions; thank you for the consultation.   Courtney [name] 08/02/2018, 10:32 AM   Cell [phone] If no answer or after 5 PM call [phone]

## 2018-08-02 NOTE — Progress Notes (Signed)
  [facility] Progress Note    Assessment/ Plan:   78 year old BF with multiple medical problems including ESRD. Presents with weakness found to have hgb of 5.7 1hgb of 5.7- on 5/27 was 8.5 when she came for access procedure. Son denies overt bleeding. Do not have recent information from the kidney center about iron or ESA dosing.s/p2 units on 6/6.  Dosed with ESA. GI not to consider invasive eval at this time- appropriate. GI rx PPI + stopping Plavix and keeping the ASA. 2 ESRD:normally TTS at [facility] via [facility]. Last HD was Monday. Plan next HD Thursday to get back on schedule.  3 Hypertension:BP is low- looks like at home is on hydralazine and lasix- being held. Her volume looks fine right now.  5. Metabolic Bone Disease - Will check a phos in AM  Subjective:   Denies f/c/n/v/dyspnea.    Objective:   BP (!) 101/44 (BP Location: Left Arm)   Pulse 61   Temp (!) 95.5 F (35.3 C) (Axillary)   Resp 10   Ht 4\' 11"  (1.499 m)   Wt 36.6 kg   SpO2 99%   BMI 16.30 kg/m   Intake/Output Summary (Last 24 hours) at 08/02/2018 1316 Last data filed at 08/01/2018 2300 Gross per 24 hour  Intake 60 ml  Output -109 ml  Net 169 ml   Weight change:   Physical Exam: General:somnolent- arousable no c/o's  Heart:RRR- brady Lungs:mostly clear Abdomen:nontender Extremities:really no edema Dialysis Access:RUA AVG   Imaging: No results found.  Labs: BMET Recent Labs  Lab 07/30/18 1406 08/01/18 0202  NA 142 141  K 3.6 4.3  CL 102 105  CO2 26 22  GLUCOSE 126* 95  BUN 64* 94*  CREATININE 5.37* 6.29*  CALCIUM 7.9* 7.3*   CBC Recent Labs  Lab 07/30/18 1406  07/31/18 0913 08/01/18 0202 08/01/18 0912 08/02/18 0332  WBC 7.3  --   --  7.5  --  6.5  NEUTROABS 5.9  --   --   --   --   --   HGB 5.7*   < > 7.4* 6.5* 8.9* 8.2*  HCT 18.7*   < > 22.6* 19.9* 26.5* 25.1*  MCV 97.4  --   --  93.0  --  89.6  PLT 145*  --   --  121*  --  88*   < > =  values in this interval not displayed.    Medications:    . sodium chloride   Intravenous Once  . Chlorhexidine Gluconate Cloth  6 each Topical Q0600  . darbepoetin (ARANESP) injection - DIALYSIS  150 mcg Intravenous Q Mon-HD  . escitalopram  5 mg Oral Daily  . feeding supplement (NEPRO CARB STEADY)  237 mL Oral BID BM  . mouth rinse  15 mL Mouth Rinse BID  . multivitamin  1 tablet Oral QHS  . Pancrealipase (Lip-Prot-Amyl) CPEP 6,000 units  1 capsule Oral TID WC  . pantoprazole (PROTONIX) IV  40 mg Intravenous Q24H      Otelia Santee, MD 08/02/2018, 1:16 PM

## 2018-08-03 ENCOUNTER — Encounter (HOSPITAL_COMMUNITY)
Admission: EM | Disposition: A | Discharge status: Home Health | Payer: Self-pay | Source: Home / Self Care | Attending: Internal Medicine

## 2018-08-03 ENCOUNTER — Inpatient Hospital Stay (HOSPITAL_COMMUNITY): Payer: Medicare Other

## 2018-08-03 ENCOUNTER — Encounter (HOSPITAL_COMMUNITY): Payer: Self-pay | Admitting: Gastroenterology

## 2018-08-03 DIAGNOSIS — I959 Hypotension, unspecified: Secondary | ICD-10-CM

## 2018-08-03 DIAGNOSIS — J9601 Acute respiratory failure with hypoxia: Secondary | ICD-10-CM

## 2018-08-03 HISTORY — PX: ESOPHAGOGASTRODUODENOSCOPY: SHX5428

## 2018-08-03 HISTORY — PX: SCLEROTHERAPY: SHX6841

## 2018-08-03 HISTORY — PX: HOT HEMOSTASIS: SHX5433

## 2018-08-03 LAB — TYPE AND SCREEN
ABO/RH(D): A POS
Antibody Screen: NEGATIVE
Unit division: 0
Unit division: 0
Unit division: 0

## 2018-08-03 LAB — POCT I-STAT 7, (LYTES, BLD GAS, ICA,H+H)
Acid-base deficit: 3 mmol/L — ABNORMAL HIGH (ref 0.0–2.0)
Acid-base deficit: 4 mmol/L — ABNORMAL HIGH (ref 0.0–2.0)
Bicarbonate: 21.3 mmol/L (ref 20.0–28.0)
Bicarbonate: 21.7 mmol/L (ref 20.0–28.0)
Calcium, Ion: 1.05 mmol/L — ABNORMAL LOW (ref 1.15–1.40)
Calcium, Ion: 1.08 mmol/L — ABNORMAL LOW (ref 1.15–1.40)
HCT: 20 % — ABNORMAL LOW (ref 36.0–46.0)
HCT: 20 % — ABNORMAL LOW (ref 36.0–46.0)
Hemoglobin: 6.8 g/dL — CL (ref 12.0–15.0)
Hemoglobin: 6.8 g/dL — CL (ref 12.0–15.0)
O2 Saturation: 100 %
O2 Saturation: 70 %
Patient temperature: 96.3
Patient temperature: 96.4
Potassium: 4 mmol/L (ref 3.5–5.1)
Potassium: 4.3 mmol/L (ref 3.5–5.1)
Sodium: 136 mmol/L (ref 135–145)
Sodium: 136 mmol/L (ref 135–145)
TCO2: 23 mmol/L (ref 22–32)
TCO2: 23 mmol/L (ref 22–32)
pCO2 arterial: 32.4 mmHg (ref 32.0–48.0)
pCO2 arterial: 39.2 mmHg (ref 32.0–48.0)
pH, Arterial: 7.338 — ABNORMAL LOW (ref 7.350–7.450)
pH, Arterial: 7.428 (ref 7.350–7.450)
pO2, Arterial: 36 mmHg — CL (ref 83.0–108.0)
pO2, Arterial: 497 mmHg — ABNORMAL HIGH (ref 83.0–108.0)

## 2018-08-03 LAB — CBC
HCT: 19 % — ABNORMAL LOW (ref 36.0–46.0)
HCT: 21.4 % — ABNORMAL LOW (ref 36.0–46.0)
Hemoglobin: 6.3 g/dL — CL (ref 12.0–15.0)
Hemoglobin: 7.1 g/dL — ABNORMAL LOW (ref 12.0–15.0)
MCH: 30.1 pg (ref 26.0–34.0)
MCH: 30.3 pg (ref 26.0–34.0)
MCHC: 33.2 g/dL (ref 30.0–36.0)
MCHC: 33.2 g/dL (ref 30.0–36.0)
MCV: 90.9 fL (ref 80.0–100.0)
MCV: 91.5 fL (ref 80.0–100.0)
Platelets: 100 10*3/uL — ABNORMAL LOW (ref 150–400)
Platelets: 71 10*3/uL — ABNORMAL LOW (ref 150–400)
RBC: 2.09 MIL/uL — ABNORMAL LOW (ref 3.87–5.11)
RBC: 2.34 MIL/uL — ABNORMAL LOW (ref 3.87–5.11)
RDW: 17.7 % — ABNORMAL HIGH (ref 11.5–15.5)
RDW: 16.5 % — ABNORMAL HIGH (ref 11.5–15.5)
WBC: 8.2 10*3/uL (ref 4.0–10.5)
WBC: 10.8 10*3/uL — ABNORMAL HIGH (ref 4.0–10.5)
nRBC: 0 % (ref 0.0–0.2)
nRBC: 0.3 % — ABNORMAL HIGH (ref 0.0–0.2)

## 2018-08-03 LAB — HEMOGLOBIN AND HEMATOCRIT, BLOOD
HCT: 25.7 % — ABNORMAL LOW (ref 36.0–46.0)
Hemoglobin: 8.5 g/dL — ABNORMAL LOW (ref 12.0–15.0)

## 2018-08-03 LAB — BPAM RBC
Blood Product Expiration Date: 202006182359
Blood Product Expiration Date: 202006182359
Blood Product Expiration Date: 202006182359
ISSUE DATE / TIME: 202006040759
ISSUE DATE / TIME: 202006061608
ISSUE DATE / TIME: 202006080405
Unit Type and Rh: 6200
Unit Type and Rh: 6200
Unit Type and Rh: 6200

## 2018-08-03 LAB — BASIC METABOLIC PANEL
Anion gap: 11 (ref 5–15)
BUN: 46 mg/dL — ABNORMAL HIGH (ref 8–23)
CO2: 25 mmol/L (ref 22–32)
Calcium: 7.7 mg/dL — ABNORMAL LOW (ref 8.9–10.3)
Chloride: 103 mmol/L (ref 98–111)
Creatinine, Ser: 4 mg/dL — ABNORMAL HIGH (ref 0.44–1.00)
GFR calc Af Amer: 12 mL/min — ABNORMAL LOW (ref >60)
GFR calc non Af Amer: 10 mL/min — ABNORMAL LOW (ref >60)
Glucose, Bld: 95 mg/dL (ref 70–99)
Potassium: 3.5 mmol/L (ref 3.5–5.1)
Sodium: 139 mmol/L (ref 135–145)

## 2018-08-03 LAB — GLUCOSE, CAPILLARY
Glucose-Capillary: 81 mg/dL (ref 70–99)
Glucose-Capillary: 66 mg/dL — ABNORMAL LOW (ref 70–99)
Glucose-Capillary: 73 mg/dL (ref 70–99)
Glucose-Capillary: 35 mg/dL — CL (ref 70–99)
Glucose-Capillary: 146 mg/dL — ABNORMAL HIGH (ref 70–99)
Glucose-Capillary: 36 mg/dL — CL (ref 70–99)
Glucose-Capillary: 58 mg/dL — ABNORMAL LOW (ref 70–99)
Glucose-Capillary: 222 mg/dL — ABNORMAL HIGH (ref 70–99)
Glucose-Capillary: 185 mg/dL — ABNORMAL HIGH (ref 70–99)

## 2018-08-03 LAB — PREPARE RBC (CROSSMATCH)

## 2018-08-03 LAB — PROTIME-INR
INR: 1.3 — ABNORMAL HIGH (ref 0.8–1.2)
Prothrombin Time: 16.4 seconds — ABNORMAL HIGH (ref 11.4–15.2)

## 2018-08-03 LAB — PHOSPHORUS: Phosphorus: 4.2 mg/dL (ref 2.5–4.6)

## 2018-08-03 SURGERY — EGD (ESOPHAGOGASTRODUODENOSCOPY)
Anesthesia: Moderate Sedation

## 2018-08-03 MED ORDER — FENTANYL CITRATE (PF) 100 MCG/2ML IJ SOLN
INTRAMUSCULAR | Status: AC
  Filled 2018-08-03: qty 4

## 2018-08-03 MED ORDER — STERILE WATER FOR INJECTION IJ SOLN
INTRAMUSCULAR | Status: AC
  Filled 2018-08-03: qty 10

## 2018-08-03 MED ORDER — GLUCAGON HCL RDNA (DIAGNOSTIC) 1 MG IJ SOLR
INTRAMUSCULAR | Status: AC
  Administered 2018-08-03: 1 mg via INTRAMUSCULAR
  Filled 2018-08-03: qty 1

## 2018-08-03 MED ORDER — TECHNETIUM TC 99M-LABELED RED BLOOD CELLS IV KIT
30.0000 | PACK | Freq: Once | INTRAVENOUS | Status: AC | PRN
  Administered 2018-08-03: 30 via INTRAVENOUS

## 2018-08-03 MED ORDER — DEXTROSE 50 % IV SOLN
INTRAVENOUS | Status: AC
  Administered 2018-08-03: 19:00:00 50 mL
  Filled 2018-08-03: qty 50

## 2018-08-03 MED ORDER — FENTANYL CITRATE (PF) 100 MCG/2ML IJ SOLN
25.0000 ug | INTRAMUSCULAR | Status: DC | PRN
  Filled 2018-08-03: qty 2

## 2018-08-03 MED ORDER — SODIUM CHLORIDE 0.9 % IV BOLUS
250.0000 mL | Freq: Once | INTRAVENOUS | Status: AC
  Administered 2018-08-03: 250 mL via INTRAVENOUS

## 2018-08-03 MED ORDER — MIDAZOLAM BOLUS VIA INFUSION: 2.0000 mg | Freq: Once | INTRAVENOUS | Status: DC

## 2018-08-03 MED ORDER — ORAL CARE MOUTH RINSE
15.0000 mL | OROMUCOSAL | Status: DC
  Administered 2018-08-04 (×4): 15 mL via OROMUCOSAL

## 2018-08-03 MED ORDER — EPINEPHRINE 1 MG/10ML IJ SOSY
PREFILLED_SYRINGE | INTRAMUSCULAR | Status: AC
  Filled 2018-08-03: qty 10

## 2018-08-03 MED ORDER — DARBEPOETIN ALFA 150 MCG/0.3ML IJ SOSY
150.0000 ug | PREFILLED_SYRINGE | INTRAMUSCULAR | Status: DC
  Filled 2018-08-03: qty 0.3

## 2018-08-03 MED ORDER — SODIUM CHLORIDE (PF) 0.9 % IJ SOLN
PREFILLED_SYRINGE | INTRAMUSCULAR | Status: DC | PRN
  Administered 2018-08-03: 2 mL

## 2018-08-03 MED ORDER — GLUCOSE 4 G PO CHEW
CHEWABLE_TABLET | ORAL | Status: AC
  Filled 2018-08-03: qty 1

## 2018-08-03 MED ORDER — MIDAZOLAM HCL (PF) 5 MG/ML IJ SOLN
INTRAMUSCULAR | Status: AC
  Filled 2018-08-03: qty 2

## 2018-08-03 MED ORDER — NOREPINEPHRINE 4 MG/250ML-% IV SOLN
0.0000 ug/min | INTRAVENOUS | Status: DC
  Administered 2018-08-03: 2 ug/min via INTRAVENOUS
  Administered 2018-08-04: 4 ug/min via INTRAVENOUS
  Filled 2018-08-03 (×2): qty 250

## 2018-08-03 MED ORDER — CHLORHEXIDINE GLUCONATE 0.12% ORAL RINSE (MEDLINE KIT)
15.0000 mL | Freq: Two times a day (BID) | OROMUCOSAL | Status: DC
  Administered 2018-08-04 (×2): 15 mL via OROMUCOSAL

## 2018-08-03 MED ORDER — SODIUM CHLORIDE 0.9% IV SOLUTION: Freq: Once | INTRAVENOUS | Status: DC

## 2018-08-03 MED ORDER — PHENTOLAMINE MESYLATE 5 MG IJ SOLR
10.0000 mg | Freq: Once | INTRAMUSCULAR | Status: AC
  Administered 2018-08-03: 5 mg via SUBCUTANEOUS
  Filled 2018-08-03: qty 10

## 2018-08-03 MED ORDER — MIDAZOLAM HCL 2 MG/2ML IJ SOLN
INTRAMUSCULAR | Status: AC
  Administered 2018-08-03: 2 mg
  Filled 2018-08-03: qty 2

## 2018-08-03 MED ORDER — ETOMIDATE 2 MG/ML IV SOLN
20.0000 mg | Freq: Once | INTRAVENOUS | Status: AC
  Administered 2018-08-03: 20 mg via INTRAVENOUS

## 2018-08-03 MED ORDER — FENTANYL CITRATE (PF) 100 MCG/2ML IJ SOLN
25.0000 ug | INTRAMUSCULAR | Status: DC | PRN
  Administered 2018-08-03 (×2): 25 ug via INTRAVENOUS
  Administered 2018-08-04 (×2): 50 ug via INTRAVENOUS
  Filled 2018-08-03 (×3): qty 2

## 2018-08-03 MED ORDER — SODIUM CHLORIDE 0.9 % IV SOLN: INTRAVENOUS | Status: DC

## 2018-08-03 MED ORDER — DIPHENHYDRAMINE HCL 50 MG/ML IJ SOLN
INTRAMUSCULAR | Status: AC
  Filled 2018-08-03: qty 1

## 2018-08-03 NOTE — Progress Notes (Signed)
Courtney [name] 9:41 PM  Subjective: Patient seen and examined and discussed on the phone with the nurse and the ICU team as well as my partner Dr. Paulita Fujita and her hospital computer chart reviewed and her positive bleeding scan as well as her increase GI blood loss was discussed and the ICU team discussed her case with her daughter who would like for [place] to pursue endoscopy and aggressive care  Objective: Vital signs currently stable intubated no acute distress exam please see preassessment evaluation labs reviewed CT and bleeding scan reviewed  Assessment: GI blood loss in patient with multiple medical problems  Plan: Okay to proceed with endoscopy this evening with further work-up and plans pending those findings  [facility]  office [phone] After 5PM or if no answer call [phone]

## 2018-08-03 NOTE — Significant Event (Signed)
Rapid Response Event Note  Overview: Time Called: 0832 Arrival Time: 0845 Event Type: Hypotension  Initial Focused Assessment: Called to bedside to patient with ICU transfer orders because of hypotension and bloody stools.  Patient easily responsive O x 1.  Appears to be contracted. Lung sounds clear, decreased bases.  Heart tones regular 1 u PRBC infusing BP 89-96/40s  Mean 60   HR 66  RR 14  O2 sat 99% on RA  Interventions: Assessed IV sites, 20 ga NSL leaking, d/c'd.  18 ga infusing prbc IV consult placed for PIV  Plan nuc med study,  swot rn to travel with patient, who will start Levophed if needed.   Plan of Care (if not transferred):  Event Summary: Name of Physician Notified: Lama at [place]    at       Event End Time: [address]

## 2018-08-03 NOTE — Progress Notes (Addendum)
[facility]  PROGRESS NOTE  Courtney [name] PPJ:[id number] DOB: Feb 08, 1941 DOA: 07/30/2018 PCP: Neale Burly, MD   Brief HPI:   78 year old female with a history of ESRD on hemodialysis Tuesday Thursday and Saturday, diastolic heart failure, stroke, CAD, diabetes mellitus, hypertension, hyperlipidemia presented to ED from hemodialysis for hypotension.  On arrival she was afebrile and hypotensive with BP of 70/40.  Hemoglobin was 5.7.  GI was consulted, patient received 2 units PRBC.   Subjective   This morning patient again became hypotensive with blood pressure 77/40, hemoglobin dropped to 6.3.  Hemoglobin was 8.2 yesterday.  She had 1 bloody bowel movement last night.   Assessment/Plan:     1. GI bleed/hematochezia-patient has ongoing GI bleed, found to be hypotensive this morning.  1 unit PRBC has been ordered.  Called and discussed with GI Dr. Paulita Fujita, he will see the patient and recommends transfer to ICU.  Earlier GI had no plans for endoscopy given patient's comorbid conditions and significant risk for mortality during procedure.  Will order nuclear RBC tagged scan.  Will transfer to ICU for closer monitoring.  2. Hypotension-secondary to GI bleed as above.  Will start Levophed low-dose for hypotension.  Patient will be transferred to ICU.  3. ESRD on hemodialysis-patient is on hemodialysis, Tuesday Thursday and Saturday.  Followed by nephrology.  4. Coronary disease-aspirin and Plavix currently on hold due to above.    CBG: Recent Labs  Lab 08/03/18 1050 08/03/18 1510 08/03/18 1549  GLUCAP 81 66* 73    CBC: Recent Labs  Lab 07/30/18 1406  08/01/18 0202 08/01/18 0912 08/02/18 0332 08/03/18 0247 08/03/18 1625  WBC 7.3  --  7.5  --  6.5 8.2  --   NEUTROABS 5.9  --   --   --   --   --   --   HGB 5.7*   < > 6.5* 8.9* 8.2* 6.3* 8.5*  HCT 18.7*   < > 19.9* 26.5* 25.1* 19.0* 25.7*  MCV 97.4  --  93.0  --  89.6 90.9  --   PLT 145*  --  121*  --  88* 100*  --    < >  = values in this interval not displayed.    Basic Metabolic Panel: Recent Labs  Lab 07/30/18 1406 08/01/18 0202 08/03/18 0247  NA 142 141 139  K 3.6 4.3 3.5  CL 102 105 103  CO2 26 22 25   GLUCOSE 126* 95 95  BUN 64* 94* 46*  CREATININE 5.37* 6.29* 4.00*  CALCIUM 7.9* 7.3* 7.7*  PHOS  --   --  4.2     DVT prophylaxis: SCDs  Code Status: Full code  Family Communication: No family at bedside  Disposition Plan: Pending outcome of patient's RBC tagged scan and further GI recommendations     Consultants:  Nephrology  Gastroenterology   Procedures:     Antibiotics:   Anti-infectives (From admission, onward)   None       Objective   Vitals:   08/03/18 1500 08/03/18 1545 08/03/18 1600 08/03/18 1615  BP: (!) 118/49 (!) 118/51 (!) 117/57 (!) 104/50  Pulse: 68 68 66   Resp: 14 16 15 15   Temp:      TempSrc:      SpO2: (!) 83% 100% 96%   Weight:      Height:        Intake/Output Summary (Last 24 hours) at 08/03/2018 1707 Last data filed at 08/03/2018 1700 Gross per 24  hour  Intake 979.96 ml  Output 300 ml  Net 679.96 ml   Filed Weights   08/01/18 1025 08/01/18 1405 08/02/18 0230  Weight: 33.9 kg 34 kg 36.6 kg     Physical Examination:   General-appears in no acute distress Heart-S1-S2, regular, no murmur auscultated Lungs-clear to auscultation bilaterally, no wheezing or crackles auscultated Abdomen-soft, nontender, no organomegaly Extremities-no edema in the lower extremities Neuro-lethargic, not answering questions appropriately    Data Reviewed: I have personally reviewed following labs and imaging studies   Recent Results (from the past 240 hour(s))  SARS Coronavirus 2 (CEPHEID - Performed in [facility] hospital lab), Hosp Order     Status: None   Collection Time: 07/30/18  6:52 PM  Result Value Ref Range Status   SARS Coronavirus 2 NEGATIVE NEGATIVE Final    Comment: (NOTE) If result is NEGATIVE SARS-CoV-2 target nucleic acids  are NOT DETECTED. The SARS-CoV-2 RNA is generally detectable in upper and lower  respiratory specimens during the acute phase of infection. The lowest  concentration of SARS-CoV-2 viral copies this assay can detect is 250  copies / mL. A negative result does not preclude SARS-CoV-2 infection  and should not be used as the sole basis for treatment or other  patient management decisions.  A negative result may occur with  improper specimen collection / handling, submission of specimen other  than nasopharyngeal swab, presence of viral mutation(s) within the  areas targeted by this assay, and inadequate number of viral copies  (<250 copies / mL). A negative result must be combined with clinical  observations, patient history, and epidemiological information. If result is POSITIVE SARS-CoV-2 target nucleic acids are DETECTED. The SARS-CoV-2 RNA is generally detectable in upper and lower  respiratory specimens dur ing the acute phase of infection.  Positive  results are indicative of active infection with SARS-CoV-2.  Clinical  correlation with patient history and other diagnostic information is  necessary to determine patient infection status.  Positive results do  not rule out bacterial infection or co-infection with other viruses. If result is PRESUMPTIVE POSTIVE SARS-CoV-2 nucleic acids MAY BE PRESENT.   A presumptive positive result was obtained on the submitted specimen  and confirmed on repeat testing.  While 2019 novel coronavirus  (SARS-CoV-2) nucleic acids may be present in the submitted sample  additional confirmatory testing may be necessary for epidemiological  and / or clinical management purposes  to differentiate between  SARS-CoV-2 and other Sarbecovirus currently known to infect humans.  If clinically indicated additional testing with an alternate test  methodology [phone]) is advised. The SARS-CoV-2 RNA is generally  detectable in upper and lower respiratory sp ecimens  during the acute  phase of infection. The expected result is Negative. Fact Sheet for Patients:  StrictlyIdeas.no Fact Sheet for Healthcare Providers: BankingDealers.co.za This test is not yet approved or cleared by the [place] FDA and has been authorized for detection and/or diagnosis of SARS-CoV-2 by FDA under an Emergency Use Authorization (EUA).  This EUA will remain in effect (meaning this test can be used) for the duration of the COVID-19 declaration under Section 564(b)(1) of the Act, 21 U.S.C. section 360bbb-3(b)(1), unless the authorization is terminated or revoked sooner. Performed at [facility], [place] [address], [place],  [address]   MRSA PCR Screening     Status: None   Collection Time: 08/01/18 10:53 PM  Result Value Ref Range Status   MRSA by PCR NEGATIVE NEGATIVE Final  Comment:        The GeneXpert MRSA Assay (FDA approved for NASAL specimens only), is one component of a comprehensive MRSA colonization surveillance program. It is not intended to diagnose MRSA infection nor to guide or monitor treatment for MRSA infections. Performed at [facility], [place] [address] [place], [place] [address]      Liver Function Tests: Recent Labs  Lab 07/30/18 1406 08/01/18 0202  AST 53* 36  ALT 113* 81*  ALKPHOS 64 62  BILITOT 0.6 0.9  PROT 5.2* 4.8*  ALBUMIN 2.5* 2.4*      Studies: Nm Gi Blood Loss  Result Date: 08/03/2018 CLINICAL DATA:  Bloody stools, anemia, hypotensive EXAM: NUCLEAR MEDICINE GASTROINTESTINAL BLEEDING SCAN TECHNIQUE: Sequential abdominal images were obtained following intravenous administration of Tc-67m labeled red blood cells. RADIOPHARMACEUTICALS:  23.5 mCi Tc-60m pertechnetate in-vitro labeled red cells. COMPARISON:  CT abdomen pelvis, 07/30/2018 FINDINGS: There is abnormal radiotracer localization, which when comparing to anatomic configuration on prior CT appears to  be within the gastric lumen or perhaps the proximal duodenum, and is noted to peristalsis throughout the small bowel and proximal colon for the duration of the study. IMPRESSION: Positive tagged red blood cell examination for gastrointestinal bleed, with a very proximal source, either the stomach or duodenum. Electronically Signed   By: Eddie Candle M.D.   On: 08/03/2018 14:58    Scheduled Meds: . sodium chloride   Intravenous Once  . sodium chloride   Intravenous Once  . Chlorhexidine Gluconate Cloth  6 each Topical Q0600  . [START ON 08/04/2018] darbepoetin (ARANESP) injection - DIALYSIS  150 mcg Intravenous Q Thu-HD  . escitalopram  5 mg Oral Daily  . feeding supplement (NEPRO CARB STEADY)  237 mL Oral BID BM  . mouth rinse  15 mL Mouth Rinse BID  . multivitamin  1 tablet Oral QHS  . Pancrealipase (Lip-Prot-Amyl) CPEP 6,000 units  1 capsule Oral TID WC  . pantoprazole (PROTONIX) IV  40 mg Intravenous Q24H    Admission status: Inpatient: Based on patients clinical presentation and evaluation of above clinical data, I have made determination that patient meets Inpatient criteria at this time.  Time spent: 20 min  [facility] Pager [phone]. If 7PM-7AM, please contact night-coverage at [email or web address], Office  [phone]  password [email or web address]  08/03/2018, 5:07 PM  LOS: 4 days

## 2018-08-03 NOTE — Progress Notes (Signed)
Attempted to call Dr. Paulita Fujita at [facility] regarding how much blood the pt has had from her rectum. She gets cleaned up then instantly saturates the lower tenth of the pad. Left a message. Previously called Dr. Darrick Meigs about it.

## 2018-08-03 NOTE — Progress Notes (Signed)
Spoke with Dr. Watt Climes at [facility] regarding the amount of blood coming from pt's rectum. He advised to put a verbal order in for IR to do a "STAT angiogram with possible coils." Verbal order repeated back to him for verification. IR doc called and informed of order. IR doc arriving at bedside shortly. Will continue to closely monitor and support pt.

## 2018-08-03 NOTE — Progress Notes (Addendum)
Pt getting blood (pt denied chest pain, difficulty breathing), got 500cc bolus, BP remains soft. MD Darrick Meigs in to evaluate, called GI [facility]. To transfer to ICU with levophed.  BP (!) 77/38   Pulse 61   Temp 97.7 F (36.5 C) (Axillary)   Resp 13   Ht 4\' 11"  (1.499 m)   Wt 36.6 kg   SpO2 99%   BMI 16.30 kg/m    Pt still receiving blood, no s/s trabnsfusion reaction. One of her IVs leaking - IV team consult in. Plan for Nuc Med at 1000. Pt responding to PRBC, see VS.  BP (!) 96/48   Pulse 65   Temp 97.7 F (36.5 C) (Axillary)   Resp 13   Ht 4\' 11"  (1.499 m)   Wt 36.6 kg   SpO2 98%   BMI 16.30 kg/m

## 2018-08-03 NOTE — Progress Notes (Signed)
CRITICAL VALUE ALERT  Critical Value:  6.3      Date & Time Notied:  08/03/18 0409  Provider Notified: K. Shorr, NP   Orders Received/Actions taken: awaiting orders

## 2018-08-03 NOTE — Progress Notes (Signed)
Patient BP 80/40.  Notified on call Lamar Blinks, NP.  Will continue to monitor the patient

## 2018-08-03 NOTE — Consult Note (Addendum)
Chief Complaint: Patient was seen in consultation today for GI ble Chief Complaint  Patient presents with   Hypotension    Referring Physician(s): Raliegh Ip Schorr, NP ([facility]ist [facility])  Patient Status: [facility] - In-pt  History of Present Illness: Courtney [name] is a 78 y.o. female with multiple medical problems including end-stage renal disease on hemodialysis, diastolic heart failure, history of stroke, and presented recently to the emergency department with hypotension.  Patient was found to be anemic with a hemoglobin of 5.7.  Interventional Radiology is familiar with this patient due to her dialysis and her right upper arm AV graft.  She has had previous AV graft declot procedures.  Most recent AV graft procedure was 07/20/2018.  Patient has received blood transfusions during the hospitalization and had a another decrease in her hemoglobin.  GI has been following the patient and feels there is significant risk for endoscopy due to her comorbidities.  A tagged red blood cell study was performed earlier today and was read as positive.  Interventional Radiology was consulted for possible arteriogram and embolization procedure.  Patient is awake in her room and she seems aware of her surroundings but there is minimal verbal interaction.  This seems to be her baseline based on my previous interaction with her.  While I was evaluating the patient, her blood glucose was 35.  Patient has a single peripheral IV in her left arm.  Past Medical History:  Diagnosis Date   Anemia 07/2018   CHF (congestive heart failure) (HCC)    Coronary artery disease    Diabetes mellitus without complication ([place])    ESRD on dialysis ([place]) 02/10/2017   Hyperlipidemia    Hypertension    Myocardial infarct ([place])    Stroke ([place])    2-3 yrs ago- right sided weakness    Past Surgical History:  Procedure Laterality Date   APPENDECTOMY     AV FISTULA PLACEMENT Right 09/20/2015   Procedure: PLACEMENT OF  RIGHT UPPER EXTREMITY ARTERIOVENOUS GORE-TEX GRAFT FOR HEMODIALYSIS ACCESS;  Surgeon: Vickie Epley, MD;  Location: [facility];  Service: Vascular;  Laterality: Right;   CATARACT EXTRACTION W/PHACO Left 06/29/2013   Procedure: CATARACT EXTRACTION PHACO AND INTRAOCULAR LENS PLACEMENT ([place]);  Surgeon: Tonny Branch, MD;  Location: [facility];  Service: Ophthalmology;  Laterality: Left;  CDE 12.25   CATARACT EXTRACTION W/PHACO Right 07/27/2013   Procedure: CATARACT EXTRACTION PHACO AND INTRAOCULAR LENS PLACEMENT ([facility]);  Surgeon: Tonny Branch, MD;  Location: [facility];  Service: Ophthalmology;  Laterality: Right;  CDE:7.72   INSERTION OF DIALYSIS CATHETER Right 09/11/2015   Procedure: INSERTION OF TUNNELED DIALYSIS CATHETER;  Surgeon: Vickie Epley, MD;  Location: [facility];  Service: Vascular;  Laterality: Right;   INTRAMEDULLARY (IM) NAIL INTERTROCHANTERIC Right 07/20/2012   Procedure: INTRAMEDULLARY (IM) NAIL INTERTROCHANTRIC;  Surgeon: Carole Civil, MD;  Location: [facility];  Service: Orthopedics;  Laterality: Right;   IR AV DIALY SHUNT INTRO NEEDLE/INTRAC INITIAL W/PTA/STENT/IMG RT Right 07/20/2018   IR GENERIC HISTORICAL  09/25/2015   IR US GUIDE VASC ACCESS RIGHT 09/25/2015 Sandi Mariscal, MD [facility]-INTERV RAD   IR GENERIC HISTORICAL  09/25/2015   IR FLUORO GUIDE CV LINE RIGHT 09/25/2015 Sandi Mariscal, MD [facility]   IR GENERIC HISTORICAL  09/25/2015   IR REMOVAL TUN CV CATH W/O FL 09/25/2015 Sandi Mariscal, MD [facility]   IR THROMBECTOMY AV FISTULA W/THROMBOLYSIS/PTA INC/SHUNT/IMG RIGHT Right 09/06/2017   IR THROMBECTOMY AV FISTULA W/THROMBOLYSIS/PTA INC/SHUNT/IMG RIGHT Right 09/29/2017   IR  THROMBECTOMY AV FISTULA W/THROMBOLYSIS/PTA INC/SHUNT/IMG RIGHT Right 07/20/2018   IR US GUIDE VASC ACCESS RIGHT  09/06/2017   IR US GUIDE VASC ACCESS RIGHT  09/29/2017   IR US GUIDE VASC ACCESS RIGHT  07/20/2018   IR US GUIDE VASC ACCESS RIGHT  07/20/2018    Allergies: Grapefruit flavor [flavoring agent]  Medications: Prior  to Admission medications   Medication Sig Start Date End Date Taking? Authorizing Provider  aspirin EC 81 MG tablet Take 81 mg by mouth daily.   Yes [provider]  atorvastatin (LIPITOR) 20 MG tablet Take 20 mg by mouth daily.   Yes [provider]  calcium-vitamin D (OSCAL WITH D) 500-200 MG-UNIT tablet Take 1 tablet by mouth.   Yes [provider]  cholecalciferol (VITAMIN D) 400 units TABS tablet Take 1,000 Units by mouth daily.    Yes [provider]  clopidogrel (PLAVIX) 75 MG tablet Take 75 mg by mouth daily. 07/27/18  Yes [provider]  escitalopram (LEXAPRO) 5 MG tablet Take 5 mg by mouth daily.   Yes [provider]  furosemide (LASIX) 20 MG tablet Take 20 mg by mouth daily.    Yes [provider]  hydrALAZINE (APRESOLINE) 25 MG tablet Take 25 mg by mouth 3 (three) times daily. 07/22/12  Yes Kathie Dike, MD  isosorbide mononitrate (IMDUR) 30 MG 24 hr tablet Take 30 mg by mouth daily.   Yes [provider]  K Phos Mono-Sod Phos Di & Mono (PHOSPHA 250 NEUTRAL) 155-852-130 MG TABS Take 1 tablet by mouth daily.    Yes [provider]  loperamide (IMODIUM A-D) 2 MG tablet Take 2 mg by mouth every 6 (six) hours as needed for diarrhea or loose stools.   Yes [provider]  Pancrelipase, Lip-Prot-Amyl, (CREON) 6000 units CPEP Take 1 capsule by mouth 3 (three) times daily with meals. 09/27/17  Yes [provider]  omeprazole (PRILOSEC) 40 MG capsule Take 1 capsule (40 mg total) by mouth daily. Patient not taking: Reported on 07/30/2018 02/10/17   [name], NP  oxyCODONE-acetaminophen (ROXICET) 5-325 MG tablet Take 1 tablet by mouth every 6 (six) hours as needed for severe pain. Patient not taking: Reported on 07/30/2018 09/20/15   Vickie Epley, MD     Family History  Problem Relation Age of Onset   Heart disease Mother    Heart disease Father     Social History   Socioeconomic  History   Marital status: Single    Spouse name: Not on file   Number of children: Not on file   Years of education: Not on file   Highest education level: Not on file  Occupational History   Not on file  Social Needs   Financial resource strain: Not on file   Food insecurity:    Worry: Not on file    Inability: Not on file   Transportation needs:    Medical: Not on file    Non-medical: Not on file  Tobacco Use   Smoking status: Never Smoker   Smokeless tobacco: Never Used  Substance and Sexual Activity   Alcohol use: No   Drug use: No   Sexual activity: Not Currently    Birth control/protection: None  Lifestyle   Physical activity:    Days per week: Not on file    Minutes per session: Not on file   Stress: Not on file  Relationships   Social connections:    Talks on phone: Not  on file    Gets together: Not on file    Attends religious service: Not on file    Active member of club or organization: Not on file    Attends meetings of clubs or organizations: Not on file    Relationship status: Not on file  Other Topics Concern   Not on file  Social History Narrative   Not on file     Review of Systems  Gastrointestinal: Positive for blood in stool.    Vital Signs: BP (!) 103/44    Pulse 62    Temp (!) 97.5 F (36.4 C) (Oral)    Resp 10    Ht 4\' 11"  (1.499 m)    Wt 36.6 kg    SpO2 97%    BMI 16.30 kg/m   Physical Exam Constitutional:      Comments: Awake and aware people around her but not participating in verbal conversation.  Abdominal:     General: Abdomen is flat. There is no distension.     Palpations: Abdomen is soft.     Tenderness: There is no abdominal tenderness.  Genitourinary:    Comments: There is blood on her skin in the lower pelvic region.    Imaging: Ct Abdomen Pelvis Wo Contrast  Result Date: 07/30/2018 CLINICAL DATA:  78 year old female with history of diverticulosis. Acute in the media from suspected blood-loss.  EXAM: CT ABDOMEN AND PELVIS WITHOUT CONTRAST TECHNIQUE: Multidetector CT imaging of the abdomen and pelvis was performed following the standard protocol without IV contrast. COMPARISON:  CT the abdomen and pelvis 05/26/2017. FINDINGS: Lower chest: Low-attenuation in the intravascular compartment suggestive of anemia. Atherosclerotic calcifications in the thoracic aorta, as well as the left main, left anterior descending, left circumflex and right coronary arteries. Moderate right and large left pleural effusions with extensive passive atelectasis in the lower lungs bilaterally. Hepatobiliary: No definite suspicious cystic or solid hepatic lesions are noted on today's noncontrast CT examination. Gallbladder is nearly decompressed, but otherwise unremarkable in appearance. Pancreas: No definite pancreatic mass or peripancreatic fluid or inflammatory changes noted on today's noncontrast CT examination. Spleen: Unremarkable. Adrenals/Urinary Tract: Unenhanced appearance of the kidneys and bilateral adrenal glands are normal. Urinary bladder is normal in appearance. Stomach/Bowel: Normal appearance of the stomach. No pathologic dilatation of small bowel or colon. The appendix is not confidently identified and may be surgically absent. Regardless, there are no inflammatory changes noted adjacent to the cecum to suggest the presence of an acute appendicitis at this time. Vascular/Lymphatic: Aortic atherosclerosis. No lymphadenopathy noted in the abdomen or pelvis. Reproductive: Uterus and ovaries are atrophic. Small amount of gas in the endometrial canal. Other: Trace volume of ascites.  No pneumoperitoneum. Musculoskeletal: There are no aggressive appearing lytic or blastic lesions noted in the visualized portions of the skeleton. Status post ORIF in the right proximal femur. IMPRESSION: 1. Small amount of gas in the endometrial canal. This is of uncertain etiology and significance, but is abnormal, and could indicate  endometritis with infection from gas-forming organisms, or could be related to recent procedure. Further clinical evaluation is recommended. 2. No definite source for blood loss confidently identified in the abdomen or pelvis. 3. Small volume of ascites. 4. Large left and moderate right pleural effusions with extensive passive atelectasis in the lower lobes of the lungs bilaterally. 5. Aortic atherosclerosis, in addition to left main and 3 vessel coronary artery disease. Assessment for potential risk factor modification, dietary therapy or pharmacologic therapy may be warranted, if clinically indicated.  6. Additional incidental findings, as above. Electronically Signed   By: Vinnie Langton M.D.   On: 07/30/2018 22:19   Nm Gi Blood Loss  Result Date: 08/03/2018 CLINICAL DATA:  Bloody stools, anemia, hypotensive EXAM: NUCLEAR MEDICINE GASTROINTESTINAL BLEEDING SCAN TECHNIQUE: Sequential abdominal images were obtained following intravenous administration of Tc-11m labeled red blood cells. RADIOPHARMACEUTICALS:  23.5 mCi Tc-68m pertechnetate in-vitro labeled red cells. COMPARISON:  CT abdomen pelvis, 07/30/2018 FINDINGS: There is abnormal radiotracer localization, which when comparing to anatomic configuration on prior CT appears to be within the gastric lumen or perhaps the proximal duodenum, and is noted to peristalsis throughout the small bowel and proximal colon for the duration of the study. IMPRESSION: Positive tagged red blood cell examination for gastrointestinal bleed, with a very proximal source, either the stomach or duodenum. Electronically Signed   By: Eddie Candle M.D.   On: 08/03/2018 14:58   Ir US Guide Vasc Access Right  Result Date: 07/20/2018 INDICATION: 78 year old with recently declotted right upper arm AV graft. The right arm graft and axilla were evaluated with ultrasound approximately 1 hour after the procedure and there was concern for a pseudoaneurysm in the right axilla. EXAM: RIGHT  UPPER EXTREMITY SHUNTOGRAM PLACEMENT OF COVERED STENT ACROSS THE VENOUS ANASTOMOSIS AND OUTFLOW VEIN. BALLOON ANGIOPLASTY FOLLOWING STENT PLACEMENT MEDICATIONS: None. ANESTHESIA/SEDATION: None FLUOROSCOPY TIME:  Fluoroscopy Time: 2 minutes, 48 seconds, 4 mGy CONTRAST:  20 mL Omnipaque [id number] COMPLICATIONS: None immediate. PROCEDURE: Informed written consent was obtained from the patient after a thorough discussion of the procedural risks, benefits and alternatives. All questions were addressed. Maximal Sterile Barrier Technique was utilized including caps, mask, sterile gowns, sterile gloves, sterile drape, hand hygiene and skin antiseptic. A timeout was performed prior to the initiation of the procedure. Right upper arm was prepped and draped in sterile fashion. Skin was anesthetized with 1% lidocaine. Using ultrasound guidance, 21 gauge needle was directed into the graft. Micropuncture dilator set was placed. Right upper extremity shuntogram was performed. An 8 French sheath was placed over a stiff Amplatz wire. Bentson wire was advanced into the central venous system. Additional shuntogram images were obtained. A 7 mm x 60 mm Covera stent was placed at the venous anastomosis and outflow vein. Stent was placed across the pseudoaneurysm origin. Follow-up shuntogram images obtained and the stent was balloon angioplastied with a 7 mm x 4 mm Conquest balloon. Final shuntogram images were obtained. Vascular sheath was removed with a pursestring suture. There was hemostasis at the puncture site at the end of the procedure. Dressing was placed over the right upper arm graft. FINDINGS: Following the declot procedure, the right upper arm graft and axilla was evaluated with ultrasound. There was concern for a pseudoaneurysm within the right axilla. Right upper extremity shuntogram images demonstrated a patent graft with a patent irregular outflow vein. There was increased filling of an irregular structure along the right upper  arm outflow vein. Previously, this area was thought to represent a small branching vessel but findings are more convincing for a pseudoaneurysm, particularly based on the ultrasound findings. In addition, there is a second area of irregularity along the outflow vein which could represent a second small pseudoaneurysm. As a result, both of these areas were treated with a covered stent. The outflow vein was widely patent following stent placement. There was no filling of the pseudoaneurysm(s) following stent placement. Venous anastomosis was widely patent. IMPRESSION: Successful treatment of the pseudoaneurysm involving the right upper arm AV graft outflow vein. The venous  anastomosis and outflow vein are widely patent following stent placement. The pseudoaneurysm was successfully occluded following covered stent placement. ACCESS: This access remains amenable to future percutaneous interventions as clinically indicated. Electronically Signed   By: Markus Daft M.D.   On: 07/20/2018 17:15   Ir US Guide Vasc Access Right  Result Date: 07/20/2018 INDICATION: End-stage renal disease and clotted right upper arm AV graft. EXAM: DIALYSIS GRAFT DECLOT; GRAFT/VENOUS PTA ULTRASOUND GUIDANCE FOR VASCULAR ACCESS Physician: Stephan Minister. Anselm Pancoast, MD MEDICATIONS: None. ANESTHESIA/SEDATION: Heparin 3000 units, TPA 2 mg, Versed 0.5 mg, Fentanyl 25 mcg Moderate Sedation Time:  63 minutes The patient was continuously monitored during the procedure by the interventional radiology nurse under my direct supervision. FLUOROSCOPY TIME:  Fluoroscopy Time: 7 minutes, 18 seconds, 10 mGy CONTRAST:  70 mL Omnipaque [id number] COMPLICATIONS: None immediate. PROCEDURE: Informed consent was obtained for right upper extremity declot procedure with possible stent placement and catheter placement. Patient was placed supine on the interventional table. Right arm was prepped and draped in sterile fashion. Maximal barrier sterile technique was utilized including  caps, mask, sterile gowns, sterile gloves, sterile drape, hand hygiene and skin antiseptic. The skin was anesthetized with 1% lidocaine. The graft was accessed using 21 gauge needles towards the venous and arterial anastomoses with ultrasound guidance. Ultrasound images were obtained for documentation. Micropuncture catheters were placed. 2 mg of TPA was infused through the micropuncture catheters. The vascular access pointing towards the central veins was upsized to a 6-French vascular sheath. A 5-French catheter was advanced into the central venous structures and a central venogram was performed. Fluoroscopic images were saved for documentation. The catheter pointing toward the arterial anastomosis was exchanged for a 6-French sheath. The graft was treated with the Arrow PTD thrombectomy device. The venous anastomosis was angioplastied with a 6 mm x 40 mm balloon. A wire was advanced into the arterial system. The arterial plug was pulled using a 5 [place] Fogarty balloon. There was flow in the graft. Residual narrowing at the venous anastomosis and outflow vein was treated with a 6 mm x 40 mm balloon and follow-up shuntogram images were obtained. Residual clot within the graft was treated with the PTD thrombectomy device. Follow-up shuntogram images demonstrated residual irregularity and narrowing in the outflow vein. The outflow vein and venous anastomosis were treated with a 7 mm x 40 mm Conquest balloon. Follow-up shuntogram images were obtained. Questionable area of extravasation versus branching vein was evaluated with ultrasound. No clear evidence for active bleeding or pseudoaneurysm with ultrasound. As a result, the vascular sheath removed with pursestring sutures. FINDINGS: Right upper arm AV graft was occluded. The graft was successfully recanalized. Recurrent irregularity and narrowing of the outflow vein was treated with balloon angioplasty. Small area of contrast blushing along right upper arm outflow  vein seen on multiple shuntogram images. This contrast was washing out very quickly and this was thought to represent a small draining vein. This area was even evaluated with ultrasound to exclude a pseudoaneurysm. Arterial anastomosis is patent. Tiny filling defects in the brachial artery in the lower humeral region probably represent plaque. Central veins are patent. IMPRESSION: Successful declot of the right upper extremity graft. Electronically Signed   By: Markus Daft M.D.   On: 07/20/2018 14:49   Dg Chest Portable 1 View  Result Date: 07/30/2018 CLINICAL DATA:  Hypotension. CHF. Diabetes. End-stage renal disease. EXAM: PORTABLE CHEST 1 VIEW COMPARISON:  09/08/2017 FINDINGS: Numerous leads and wires project over the chest. Right axillary vascular stent.  Moderate cardiomegaly. Atherosclerosis in the transverse aorta. Increase in moderate left pleural effusion. Small right pleural effusion, increased. Congestive heart failure is moderate and increased. Worse on the left. Left greater than right base airspace disease, increased. Skin fold over the left hemithorax. IMPRESSION: Worsened aeration with progressive congestive heart failure and left greater than right pleural effusions. Left greater than right base airspace disease could represent atelectasis. Concurrent infection cannot be excluded. Aortic Atherosclerosis (ICD10-I70.0). Electronically Signed   By: Abigail Miyamoto M.D.   On: 07/30/2018 16:14   Ir Av Dialy Shunt Intro Needle/intrac Initial W/pta/stent/img Right  Result Date: 07/20/2018 INDICATION: 78 year old with recently declotted right upper arm AV graft. The right arm graft and axilla were evaluated with ultrasound approximately 1 hour after the procedure and there was concern for a pseudoaneurysm in the right axilla. EXAM: RIGHT UPPER EXTREMITY SHUNTOGRAM PLACEMENT OF COVERED STENT ACROSS THE VENOUS ANASTOMOSIS AND OUTFLOW VEIN. BALLOON ANGIOPLASTY FOLLOWING STENT PLACEMENT MEDICATIONS: None.  ANESTHESIA/SEDATION: None FLUOROSCOPY TIME:  Fluoroscopy Time: 2 minutes, 48 seconds, 4 mGy CONTRAST:  20 mL Omnipaque [id number] COMPLICATIONS: None immediate. PROCEDURE: Informed written consent was obtained from the patient after a thorough discussion of the procedural risks, benefits and alternatives. All questions were addressed. Maximal Sterile Barrier Technique was utilized including caps, mask, sterile gowns, sterile gloves, sterile drape, hand hygiene and skin antiseptic. A timeout was performed prior to the initiation of the procedure. Right upper arm was prepped and draped in sterile fashion. Skin was anesthetized with 1% lidocaine. Using ultrasound guidance, 21 gauge needle was directed into the graft. Micropuncture dilator set was placed. Right upper extremity shuntogram was performed. An 8 French sheath was placed over a stiff Amplatz wire. Bentson wire was advanced into the central venous system. Additional shuntogram images were obtained. A 7 mm x 60 mm Covera stent was placed at the venous anastomosis and outflow vein. Stent was placed across the pseudoaneurysm origin. Follow-up shuntogram images obtained and the stent was balloon angioplastied with a 7 mm x 4 mm Conquest balloon. Final shuntogram images were obtained. Vascular sheath was removed with a pursestring suture. There was hemostasis at the puncture site at the end of the procedure. Dressing was placed over the right upper arm graft. FINDINGS: Following the declot procedure, the right upper arm graft and axilla was evaluated with ultrasound. There was concern for a pseudoaneurysm within the right axilla. Right upper extremity shuntogram images demonstrated a patent graft with a patent irregular outflow vein. There was increased filling of an irregular structure along the right upper arm outflow vein. Previously, this area was thought to represent a small branching vessel but findings are more convincing for a pseudoaneurysm, particularly based on  the ultrasound findings. In addition, there is a second area of irregularity along the outflow vein which could represent a second small pseudoaneurysm. As a result, both of these areas were treated with a covered stent. The outflow vein was widely patent following stent placement. There was no filling of the pseudoaneurysm(s) following stent placement. Venous anastomosis was widely patent. IMPRESSION: Successful treatment of the pseudoaneurysm involving the right upper arm AV graft outflow vein. The venous anastomosis and outflow vein are widely patent following stent placement. The pseudoaneurysm was successfully occluded following covered stent placement. ACCESS: This access remains amenable to future percutaneous interventions as clinically indicated. Electronically Signed   By: Markus Daft M.D.   On: 07/20/2018 17:15   Ir Thrombectomy Av Fistula/w Thrombolysis/pta Inc Shunt/img Right  Result Date: 07/20/2018 INDICATION:  End-stage renal disease and clotted right upper arm AV graft. EXAM: DIALYSIS GRAFT DECLOT; GRAFT/VENOUS PTA ULTRASOUND GUIDANCE FOR VASCULAR ACCESS Physician: Stephan Minister. Anselm Pancoast, MD MEDICATIONS: None. ANESTHESIA/SEDATION: Heparin 3000 units, TPA 2 mg, Versed 0.5 mg, Fentanyl 25 mcg Moderate Sedation Time:  63 minutes The patient was continuously monitored during the procedure by the interventional radiology nurse under my direct supervision. FLUOROSCOPY TIME:  Fluoroscopy Time: 7 minutes, 18 seconds, 10 mGy CONTRAST:  70 mL Omnipaque [id number] COMPLICATIONS: None immediate. PROCEDURE: Informed consent was obtained for right upper extremity declot procedure with possible stent placement and catheter placement. Patient was placed supine on the interventional table. Right arm was prepped and draped in sterile fashion. Maximal barrier sterile technique was utilized including caps, mask, sterile gowns, sterile gloves, sterile drape, hand hygiene and skin antiseptic. The skin was anesthetized with 1%  lidocaine. The graft was accessed using 21 gauge needles towards the venous and arterial anastomoses with ultrasound guidance. Ultrasound images were obtained for documentation. Micropuncture catheters were placed. 2 mg of TPA was infused through the micropuncture catheters. The vascular access pointing towards the central veins was upsized to a 6-French vascular sheath. A 5-French catheter was advanced into the central venous structures and a central venogram was performed. Fluoroscopic images were saved for documentation. The catheter pointing toward the arterial anastomosis was exchanged for a 6-French sheath. The graft was treated with the Arrow PTD thrombectomy device. The venous anastomosis was angioplastied with a 6 mm x 40 mm balloon. A wire was advanced into the arterial system. The arterial plug was pulled using a 5 [place] Fogarty balloon. There was flow in the graft. Residual narrowing at the venous anastomosis and outflow vein was treated with a 6 mm x 40 mm balloon and follow-up shuntogram images were obtained. Residual clot within the graft was treated with the PTD thrombectomy device. Follow-up shuntogram images demonstrated residual irregularity and narrowing in the outflow vein. The outflow vein and venous anastomosis were treated with a 7 mm x 40 mm Conquest balloon. Follow-up shuntogram images were obtained. Questionable area of extravasation versus branching vein was evaluated with ultrasound. No clear evidence for active bleeding or pseudoaneurysm with ultrasound. As a result, the vascular sheath removed with pursestring sutures. FINDINGS: Right upper arm AV graft was occluded. The graft was successfully recanalized. Recurrent irregularity and narrowing of the outflow vein was treated with balloon angioplasty. Small area of contrast blushing along right upper arm outflow vein seen on multiple shuntogram images. This contrast was washing out very quickly and this was thought to represent a small  draining vein. This area was even evaluated with ultrasound to exclude a pseudoaneurysm. Arterial anastomosis is patent. Tiny filling defects in the brachial artery in the lower humeral region probably represent plaque. Central veins are patent. IMPRESSION: Successful declot of the right upper extremity graft. Electronically Signed   By: Markus Daft M.D.   On: 07/20/2018 14:49    Labs:  CBC: Recent Labs    07/30/18 1406  08/01/18 0202 08/01/18 0912 08/02/18 0332 08/03/18 0247 08/03/18 1625  WBC 7.3  --  7.5  --  6.5 8.2  --   HGB 5.7*   < > 6.5* 8.9* 8.2* 6.3* 8.5*  HCT 18.7*   < > 19.9* 26.5* 25.1* 19.0* 25.7*  PLT 145*  --  121*  --  88* 100*  --    < > = values in this interval not displayed.    COAGS: Recent Labs    09/06/17  1226 09/08/17 1408 07/20/18 0750  INR 1.09 1.16 1.2    BMP: Recent Labs    07/20/18 0730 07/20/18 0858 07/30/18 1406 08/01/18 0202 08/03/18 0247  NA 139 139 142 141 139  K 6.5* 5.3* 3.6 4.3 3.5  CL 107  --  102 105 103  CO2 18*  --  26 22 25   GLUCOSE 76 76 126* 95 95  BUN 87*  --  64* 94* 46*  CALCIUM 8.2*  --  7.9* 7.3* 7.7*  CREATININE 9.64*  --  5.37* 6.29* 4.00*  GFRNONAA 4*  --  7* 6* 10*  GFRAA 4*  --  8* 7* 12*    LIVER FUNCTION TESTS: Recent Labs    07/30/18 1406 08/01/18 0202  BILITOT 0.6 0.9  AST 53* 36  ALT 113* 81*  ALKPHOS 64 62  PROT 5.2* 4.8*  ALBUMIN 2.5* 2.4*    TUMOR MARKERS: No results for input(s): AFPTM, CEA, CA199, CHROMGRNA in the last 8760 hours.  Assessment and Plan:  78 year old female with multiple medical problems including end-stage renal disease, hypotension, anemia and apparent GI bleeding.  A tagged red blood cell study procedure was performed earlier today.  The study was read as positive and concern for a proximal bleeding source such as the duodenum or stomach.  I personally reviewed the study and I can see why the radiologist was concerned about a bleeding source in the duodenum but there  is very little activity in the gut and I find it very difficult to confidently identify a GI bleeding source based on this study.  If the bleeding is coming from the stomach or duodenum, then the patient would probably most benefit from endoscopy to evaluate this area.  We could consider performing a CTA of the abdomen and pelvis in order to get a better visualization of the mesenteric anatomy and avoid the risks of catheter angiography.  However, I am concerned that both catheter angiography and CTA could be negative based on the questionable or minimal bleeding seen on tagged red blood cell study.  Would have GI re-evaluate for endoscopy since the bleeding source is potentially the stomach or duodenum.  Again, we could perform a CTA of the abdomen/ pelvis which will be the least invasive procedure and may give [place] some diagnostic information before proceeding with more invasive procedure such as catheter angiography or endoscopy.  This plan was discussed with K. Schorr from [facility] at the time of the consult.  Electronically Signed: Burman Riis, MD 08/03/2018, 7:31 PM   I spent a total of 20 Minutes   in face to face in clinical consultation, greater than 50% of which was counseling/coordinating care for GI bleeding.

## 2018-08-03 NOTE — Progress Notes (Signed)
Please refer all phone calls regarding pt to her daughter. Phone number listed in summary.

## 2018-08-03 NOTE — Progress Notes (Signed)
VAST RN called unit RN, Barnetta Chapel to inquire about IV needed. She stated pt's right arm is restricted and an IV in Left arm is currently infusing blood. A second IV is needed for nuclear med and possibly levophed.

## 2018-08-03 NOTE — Consult Note (Signed)
NAME:  Courtney [name], MRN:  [id number], DOB:  1941-02-03, LOS: 4 ADMISSION DATE:  07/30/2018, CONSULTATION DATE:  08/03/2018 REFERRING MD:  TRH, CHIEF COMPLAINT:  Hypotension/ GIB  Brief History   78 year old female admitted 6/6 with hypotension and Hgb of 5.7.  Over the course, she started having rectal bleeding requiring transfusions.  On 6/10, had ongoing bleeding becoming hypotensive requiring vasopressors, GI and IR evaluating.    History of present illness   HPI mostly obtained from medical chart review as patient is poor historian.   78 year old female with history of ESRD - TTS HD, diastolic HF, stroke- on ASA and plavix, CAD, DM, HTN, and HLD admitted 6/6 with hypotension and Hgb of 5.7.  ASA and plavix held.  She was transfused 2 units of PRBC and admitted for symptomatic anemia.  GI and nephrology consulting.  She was hemoccult positive.  CT abd/pelvis showed no source of bleeding or hematoma, but small amount of gas in the endometrial canal of uncertain etiology with bilateral pleural effusions.  GYN was consulted and recommended no further workup.  Endoscopy was held off given her frailty and high risk.  She started having bloody stool overnight 6/7 and again transfused 1 unit PRBC 6/8. Bowel movements turned blackish/ brown with Hgb stable until she progressively became hypotensive 6/10 with ongoing bloody stools.  She was given given IVF, 1 unit PRBC and transferred to ICU for close monitoring.  Underwent RBC tagged study which was read as positive for GIB with proximal source either stomach or duodenum.  IR was consulted for possible arteriogram and embolization procedure however he thought there was very minimal bleeding to confidently identify bleeding source and recommended GI re-evaluate for either endoscopy or CTA abd/ pelvis.  While in ICU she loss her PIV access while on levophed and PCCM consulted to assist with further medical management and stabilization.     Past Medical History   ESRD - TTS HD, diastolic HF, stroke, CAD, DM, HTN, HLD  Significant Hospital Events   6/6 Admitted  6/10 tx to ICU  Consults:  GI Nephrology IR   Procedures:   Significant Diagnostic Tests:  6/6 CT abd/ pelvis >> 1. Small amount of gas in the endometrial canal. This is of uncertain etiology and significance, but is abnormal, and could indicate endometritis with infection from gas-forming organisms, or could be related to recent procedure. Further clinical evaluation is recommended. 2. No definite source for blood loss confidently identified in the abdomen or pelvis. 3. Small volume of ascites. 4. Large left and moderate right pleural effusions with extensive passive atelectasis in the lower lobes of the lungs bilaterally. 5. Aortic atherosclerosis, in addition to left main and 3 vessel coronary artery disease. Assessment for potential risk factor modification, dietary therapy or pharmacologic therapy may be warranted, if clinically indicated. 6. Additional incidental findings, as above.  6/10 RBC tagged study >> Positive tagged red blood cell examination for gastrointestinal bleed, with a very proximal source, either the stomach or duodenum.  Micro Data:  6/6 SARS coronavirus 2 >> negative 6/8 MRSA PCR >> neg  Antimicrobials:  n/a  Interim history/subjective:   Emergent left IJ placed for temporary IV access till CVL can be placed.  Patient currently without complaints but somnolent and laying in moderate amount of maroon stools.  Labs pending.   Objective   Blood pressure (!) 103/44, pulse 62, temperature (!) 97.5 F (36.4 C), temperature source Oral, resp. rate 10, height 4'  11" (1.499 m), weight 36.6 kg, SpO2 97 %.        Intake/Output Summary (Last 24 hours) at 08/03/2018 2009 Last data filed at 08/03/2018 1800 Gross per 24 hour  Intake 780.54 ml  Output 0 ml  Net 780.54 ml   Filed Weights   08/01/18 1025 08/01/18 1405 08/02/18 0230  Weight: 33.9 kg 34 kg 36.6  kg   Examination: General:  Cachectic elderly female lying in bed in NAD  HEENT: MM pale/moist, pupils 4/reactive Neuro:  Somnolent, answers simple questions, f/c simple commands, moves all extremities- diffuse weakness CV: rr, no murmur PULM: even/non-labored, lungs bilaterally clear anteriorly, diminished in bases, on room air at 98% TF:TDDU, +bs, non-tender on palpation.  Appears to have rectal pouch full of maroon stools with leakage around  Extremities: warm/dry, no edema, RUE AVG Skin: no rashes   Resolved Hospital Problem list    Assessment & Plan:   GIB- presumed from stomach vs duodenum by RBC study with increasing bloody stools 6/10 and hemodynamic instability P:  Case discussed with Dr. Watt Climes on call for GI given IR reommendations and feels we should proceed with endoscopy if daughter consents.  After discussion with daughter, [name] with options to treat symptomatically and monitor or proceeding with Intubation/ Endoscopy, she is not comfortable "doing nothing" would like to proceed with endoscopy despite high risk for complications.  We will place CVL for better access and intubate patient for procedure in attempts to medical stabilize her as best able for procedure.   Pending Hgb, will transfuse for Hgb < 7 PPI   Shock- related to ABLA/ GIB P:  Transfuse for Hgb < 7 Pending labs Continue levophed for goal MAP >65   Acute respiratory insufficiency in the setting of procedure P:  Intubate now for endoscopy given high risk for decompensation, leave intubated overnight and hope to extubate in AM PRVC 8 cc/kg, rate 16 CXR and ABG post intubation VAP measures PAD protocol with prn fentanyl, consider adding precedex if needed   Hypoglycemia  - s/p glucagon P:  Trend glucose, if ongoing will start D10 gtt, check cortisol   ESRD - last iHD 6/8 P:  Plans for iHD 6/11 Per nephrology    Hx HTN P:  Hold lasix/ hydralazine  CAD/ hx CVA P:  Holding home ASA/  plavix    Best practice:  Diet: NPO Pain/Anxiety/Delirium protocol (if indicated): prn fentanyl VAP protocol (if indicated): yes DVT prophylaxis: SCDs only  GI prophylaxis: PPI Glucose control: CBG q4, q1 prn still stablizes Mobility: BR Code Status: full  Family Communication: Daughter, [name] updated by phone by Dr. Mariane Masters, [phone] Disposition: ICU  Labs   CBC: Recent Labs  Lab 07/30/18 1406  08/01/18 0202 08/01/18 0912 08/02/18 0332 08/03/18 0247 08/03/18 1625  WBC 7.3  --  7.5  --  6.5 8.2  --   NEUTROABS 5.9  --   --   --   --   --   --   HGB 5.7*   < > 6.5* 8.9* 8.2* 6.3* 8.5*  HCT 18.7*   < > 19.9* 26.5* 25.1* 19.0* 25.7*  MCV 97.4  --  93.0  --  89.6 90.9  --   PLT 145*  --  121*  --  88* 100*  --    < > = values in this interval not displayed.    Basic Metabolic Panel: Recent Labs  Lab 07/30/18 1406 08/01/18 0202 08/03/18 0247  NA 142 141 139  K  3.6 4.3 3.5  CL 102 105 103  CO2 26 22 25   GLUCOSE 126* 95 95  BUN 64* 94* 46*  CREATININE 5.37* 6.29* 4.00*  CALCIUM 7.9* 7.3* 7.7*  PHOS  --   --  4.2   GFR: Estimated Creatinine Clearance: 6.9 mL/min (A) (by C-G formula based on SCr of 4 mg/dL (H)). Recent Labs  Lab 07/30/18 1406 08/01/18 0202 08/02/18 0332 08/03/18 0247  WBC 7.3 7.5 6.5 8.2    Liver Function Tests: Recent Labs  Lab 07/30/18 1406 08/01/18 0202  AST 53* 36  ALT 113* 81*  ALKPHOS 64 62  BILITOT 0.6 0.9  PROT 5.2* 4.8*  ALBUMIN 2.5* 2.4*   No results for input(s): LIPASE, AMYLASE in the last 168 hours. No results for input(s): AMMONIA in the last 168 hours.  ABG No results found for: PHART, PCO2ART, PO2ART, HCO3, TCO2, ACIDBASEDEF, O2SAT   Coagulation Profile: No results for input(s): INR, PROTIME in the last 168 hours.  Cardiac Enzymes: No results for input(s): CKTOTAL, CKMB, CKMBINDEX, TROPONINI in the last 168 hours.  HbA1C: Hgb A1c MFr Bld  Date/Time Value Ref Range Status  07/14/2017 04:32 AM 4.3 (L)  4.8 - 5.6 % Final    Comment:    (NOTE) Pre diabetes:          5.7%-6.4% Diabetes:              >6.4% Glycemic control for   <7.0% adults with diabetes   07/17/2012 03:50 PM 5.9 (H) <5.7 % Final    Comment:    (NOTE)                                                                       According to the ADA Clinical Practice Recommendations for 2011, when HbA1c is used as a screening test:  >=6.5%   Diagnostic of Diabetes Mellitus           (if abnormal result is confirmed) 5.7-6.4%   Increased risk of developing Diabetes Mellitus References:Diagnosis and Classification of Diabetes Mellitus,Diabetes JYNW,2956,21(HYQMV 1):S62-S69 and Standards of Medical Care in         Diabetes - 2011,Diabetes HQIO,9629,52 (Suppl 1):S11-S61.    CBG: Recent Labs  Lab 08/03/18 1510 08/03/18 1549 08/03/18 1915 08/03/18 1931 08/03/18 1934  GLUCAP 66* 73 35* 72* 8*    Review of Systems:   Unable  Past Medical History  She,  has a past medical history of Anemia (07/2018), CHF (congestive heart failure) ([place]), Coronary artery disease, Diabetes mellitus without complication ([place]), ESRD on dialysis ([place]) (02/10/2017), Hyperlipidemia, Hypertension, Myocardial infarct ([place]), and Stroke ([place]).   Surgical History    Past Surgical History:  Procedure Laterality Date  . APPENDECTOMY    . AV FISTULA PLACEMENT Right 09/20/2015   Procedure: PLACEMENT OF RIGHT UPPER EXTREMITY ARTERIOVENOUS GORE-TEX GRAFT FOR HEMODIALYSIS ACCESS;  Surgeon: Vickie Epley, MD;  Location: [facility];  Service: Vascular;  Laterality: Right;  . CATARACT EXTRACTION W/PHACO Left 06/29/2013   Procedure: CATARACT EXTRACTION PHACO AND INTRAOCULAR LENS PLACEMENT (IOC);  Surgeon: Tonny Branch, MD;  Location: [facility];  Service: Ophthalmology;  Laterality: Left;  CDE 12.25  . CATARACT EXTRACTION W/PHACO Right 07/27/2013   Procedure: CATARACT EXTRACTION PHACO AND INTRAOCULAR LENS  PLACEMENT (IOC);  Surgeon: Tonny Branch, MD;  Location: [facility];   Service: Ophthalmology;  Laterality: Right;  CDE:7.72  . INSERTION OF DIALYSIS CATHETER Right 09/11/2015   Procedure: INSERTION OF TUNNELED DIALYSIS CATHETER;  Surgeon: Vickie Epley, MD;  Location: [facility];  Service: Vascular;  Laterality: Right;  . INTRAMEDULLARY (IM) NAIL INTERTROCHANTERIC Right 07/20/2012   Procedure: INTRAMEDULLARY (IM) NAIL INTERTROCHANTRIC;  Surgeon: Carole Civil, MD;  Location: [facility];  Service: Orthopedics;  Laterality: Right;  . IR AV DIALY SHUNT INTRO NEEDLE/INTRAC INITIAL W/PTA/STENT/IMG RT Right 07/20/2018  . IR GENERIC HISTORICAL  09/25/2015   IR US GUIDE VASC ACCESS RIGHT 09/25/2015 Sandi Mariscal, MD [facility]  . IR GENERIC HISTORICAL  09/25/2015   IR FLUORO GUIDE CV LINE RIGHT 09/25/2015 Sandi Mariscal, MD [facility]  . IR GENERIC HISTORICAL  09/25/2015   IR REMOVAL TUN CV CATH W/O FL 09/25/2015 Sandi Mariscal, MD [facility]  . IR THROMBECTOMY AV FISTULA W/THROMBOLYSIS/PTA INC/SHUNT/IMG RIGHT Right 09/06/2017  . IR THROMBECTOMY AV FISTULA W/THROMBOLYSIS/PTA INC/SHUNT/IMG RIGHT Right 09/29/2017  . IR THROMBECTOMY AV FISTULA W/THROMBOLYSIS/PTA INC/SHUNT/IMG RIGHT Right 07/20/2018  . IR US GUIDE VASC ACCESS RIGHT  09/06/2017  . IR US GUIDE VASC ACCESS RIGHT  09/29/2017  . IR US GUIDE VASC ACCESS RIGHT  07/20/2018  . IR US GUIDE VASC ACCESS RIGHT  07/20/2018     Social History   reports that she has never smoked. She has never used smokeless tobacco. She reports that she does not drink alcohol or use drugs.   Family History   Her family history includes Heart disease in her father and mother.   Allergies Allergies  Allergen Reactions  . Grapefruit Flavor [Flavoring Agent]     Reaction unknown     Home Medications  Prior to Admission medications   Medication Sig Start Date End Date Taking? Authorizing Provider  aspirin EC 81 MG tablet Take 81 mg by mouth daily.   Yes [provider]  atorvastatin (LIPITOR) 20 MG tablet Take 20 mg by mouth daily.   Yes  [provider]  calcium-vitamin D (OSCAL WITH D) 500-200 MG-UNIT tablet Take 1 tablet by mouth.   Yes [provider]  cholecalciferol (VITAMIN D) 400 units TABS tablet Take 1,000 Units by mouth daily.    Yes [provider]  clopidogrel (PLAVIX) 75 MG tablet Take 75 mg by mouth daily. 07/27/18  Yes [provider]  escitalopram (LEXAPRO) 5 MG tablet Take 5 mg by mouth daily.   Yes [provider]  furosemide (LASIX) 20 MG tablet Take 20 mg by mouth daily.    Yes [provider]  hydrALAZINE (APRESOLINE) 25 MG tablet Take 25 mg by mouth 3 (three) times daily. 07/22/12  Yes Kathie Dike, MD  isosorbide mononitrate (IMDUR) 30 MG 24 hr tablet Take 30 mg by mouth daily.   Yes [provider]  K Phos Mono-Sod Phos Di & Mono (PHOSPHA 250 NEUTRAL) 155-852-130 MG TABS Take 1 tablet by mouth daily.    Yes [provider]  loperamide (IMODIUM A-D) 2 MG tablet Take 2 mg by mouth every 6 (six) hours as needed for diarrhea or loose stools.   Yes [provider]  Pancrelipase, Lip-Prot-Amyl, (CREON) 6000 units CPEP Take 1 capsule by mouth 3 (three) times daily with meals. 09/27/17  Yes [provider]  omeprazole (PRILOSEC) 40 MG capsule Take 1 capsule (40 mg total) by mouth daily. Patient not taking: Reported on 07/30/2018 02/10/17  Setzer, Terri L, NP  oxyCODONE-acetaminophen (ROXICET) 5-325 MG tablet Take 1 tablet by mouth every 6 (six) hours as needed for severe pain. Patient not taking: Reported on 07/30/2018 09/20/15   Vickie Epley, MD     Critical care time: 69 mins    Kennieth Rad, MSN, AGACNP-BC [facility] Pgr: [phone] or if no answer [phone] 08/03/2018, 9:12 PM

## 2018-08-03 NOTE — Procedures (Signed)
Intubation Procedure Note Courtney [name] [id number] Jan 17, 1941  Procedure: Intubation Indications: Airway protection and maintenance  Procedure Details Consent: Risks of procedure as well as the alternatives and risks of each were explained to the (patient/caregiver).  Consent for procedure obtained.  Consent from patients daughter by phone. Time Out: Verified patient identification, verified procedure, site/side was marked, verified correct patient position, special equipment/implants available, medications/allergies/relevent history reviewed, required imaging and test results available.  Performed  Maximum sterile technique was used including cap, gloves, gown and hand hygiene.  3    Evaluation Hemodynamic Status: BP stable throughout; O2 sats: stable throughout Patient's Current Condition: stable Complications: No apparent complications Patient did tolerate procedure well. Chest X-ray ordered to verify placement.  CXR: pending.   Shellia Cleverly 08/03/2018

## 2018-08-03 NOTE — Progress Notes (Signed)
Subjective: Had drop in BP overnight with decrease in Hgb. One bloody stool last night. Hgb drop:  Has been given more blood transfusions. BP improved with volume repletion.  Objective: Vital signs in last 24 hours: Temp:  [97.5 F (36.4 C)-97.7 F (36.5 C)] 97.7 F (36.5 C) (06/10 0753) Pulse Rate:  [61-67] 65 (06/10 0933) Resp:  [11-19] 13 (06/10 0933) BP: (76-112)/(34-65) 96/48 (06/10 0933) SpO2:  [95 %-99 %] 98 % (06/10 0933) Weight change:  Last BM Date: 08/02/18  PE: GEN:  Chronically ill-appearing, frail-appearing, but NAD ABD:   Soft, non-tender  Lab Results: CBC    Component Value Date/Time   WBC 8.2 08/03/2018 0247   RBC 2.09 (L) 08/03/2018 0247   HGB 6.3 (LL) 08/03/2018 0247   HCT 19.0 (L) 08/03/2018 0247   PLT 100 (L) 08/03/2018 0247   MCV 90.9 08/03/2018 0247   MCH 30.1 08/03/2018 0247   MCHC 33.2 08/03/2018 0247   RDW 17.7 (H) 08/03/2018 0247   LYMPHSABS 0.7 07/30/2018 1406   MONOABS 0.6 07/30/2018 1406   EOSABS 0.0 07/30/2018 1406   BASOSABS 0.0 07/30/2018 1406   CMP     Component Value Date/Time   NA 139 08/03/2018 0247   K 3.5 08/03/2018 0247   CL 103 08/03/2018 0247   CO2 25 08/03/2018 0247   GLUCOSE 95 08/03/2018 0247   BUN 46 (H) 08/03/2018 0247   CREATININE 4.00 (H) 08/03/2018 0247   CREATININE 3.15 (H) 02/10/2017 1139   CALCIUM 7.7 (L) 08/03/2018 0247   PROT 4.8 (L) 08/01/2018 0202   ALBUMIN 2.4 (L) 08/01/2018 0202   AST 36 08/01/2018 0202   ALT 81 (H) 08/01/2018 0202   ALKPHOS 62 08/01/2018 0202   BILITOT 0.9 08/01/2018 0202   GFRNONAA 10 (L) 08/03/2018 0247   GFRAA 12 (L) 08/03/2018 0247   Assessment:  1. Anemia, hemoccult-positive with intermittent gross episodes of bleeding.CT abdomen without contrast showed no hematoma or source of bleeding.  2. Altered mental status, improving. 3. Hypotension, recurrent, now improving. Intermittent episodes of bleeding. 4. Chronic anticoagulation: Aspirin and clopidigrel on  hold. 5. Multiple medical problems: CHF, history MI, history stroke.  Plan:  1.  Aspirin and clopidigrel on hold. 2.  Continue PPI. 3.  Serial CBCs and volume repletion/transfusion as needed. 4.  I have discussed case at length with patient's daughter, who is concerned about the significant risks of procedure, and she's not sure we should go straight ahead and do endoscopy at the present time.  We will hold off on endoscopy at this time, and I'll stay in touch with daughter during the remainder of the patient's hospital stay. 5.  Tagged RBC study pending; if positive, patient is very poor endoscopy candidate and would consider IR consultation for angioembolization; if negative, and bleeding and hgb drops recurs, would revisit risks:benefits of endoscopy with patient and daughter. 6.  [facility] will follow.  Landry Dyke 08/03/2018, 11:16 AM   Cell [phone] If no answer or after 5 PM call [phone]

## 2018-08-03 NOTE — Progress Notes (Signed)
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

## 2018-08-03 NOTE — Progress Notes (Signed)
  [facility] Progress Note    Assessment/ Plan:   78 year old BF with multiple medical problems including ESRD. Presents with weakness found to have hgb of 5.7 1hgb of 5.7- on 5/27 was 8.5 when she came for access procedure. Son denies overt bleeding. Do not have recent information from the kidney center about iron or ESA dosing.s/p2 units on 6/6.Dosedwith ESA. GI not to consider invasive eval at this time- appropriate. GI rx PPI + stopping Plavix and keeping the ASA. 2 ESRD:normally TTS at [facility] via AVG. Last HD was Monday. Plan next HD Thursday to get back on schedule.  3 Hypertension:BP is low- looks like at home is on hydralazine and lasix- being held. Her volume looks fine right now.  5. Metabolic Bone Disease - Will check a phos in AM  Subjective:   Denies f/c/n/v/dyspnea.    Objective:   BP (!) 98/46   Pulse 64   Temp 97.7 F (36.5 C) (Axillary)   Resp 16   Ht 4\' 11"  (1.499 m)   Wt 36.6 kg   SpO2 95%   BMI 16.30 kg/m   Intake/Output Summary (Last 24 hours) at 08/03/2018 1232 Last data filed at 08/03/2018 0900 Gross per 24 hour  Intake 681.96 ml  Output 300 ml  Net 381.96 ml   Weight change:   Physical Exam: General:somnolent- arousable no c/o's  Heart:RRR- brady Lungs:mostly clear Abdomen:nontender Extremities:really no edema Dialysis Access:RUA AVG   Imaging: No results found.  Labs: BMET Recent Labs  Lab 07/30/18 1406 08/01/18 0202 08/03/18 0247  NA 142 141 139  K 3.6 4.3 3.5  CL 102 105 103  CO2 26 22 25   GLUCOSE 126* 95 95  BUN 64* 94* 46*  CREATININE 5.37* 6.29* 4.00*  CALCIUM 7.9* 7.3* 7.7*  PHOS  --   --  4.2   CBC Recent Labs  Lab 07/30/18 1406  08/01/18 0202 08/01/18 0912 08/02/18 0332 08/03/18 0247  WBC 7.3  --  7.5  --  6.5 8.2  NEUTROABS 5.9  --   --   --   --   --   HGB 5.7*   < > 6.5* 8.9* 8.2* 6.3*  HCT 18.7*   < > 19.9* 26.5* 25.1* 19.0*  MCV 97.4  --  93.0  --  89.6 90.9   PLT 145*  --  121*  --  88* 100*   < > = values in this interval not displayed.    Medications:    . sodium chloride   Intravenous Once  . sodium chloride   Intravenous Once  . Chlorhexidine Gluconate Cloth  6 each Topical Q0600  . [START ON 08/04/2018] darbepoetin (ARANESP) injection - DIALYSIS  150 mcg Intravenous Q Thu-HD  . escitalopram  5 mg Oral Daily  . feeding supplement (NEPRO CARB STEADY)  237 mL Oral BID BM  . mouth rinse  15 mL Mouth Rinse BID  . multivitamin  1 tablet Oral QHS  . Pancrealipase (Lip-Prot-Amyl) CPEP 6,000 units  1 capsule Oral TID WC  . pantoprazole (PROTONIX) IV  40 mg Intravenous Q24H      Otelia Santee, MD 08/03/2018, 12:32 PM

## 2018-08-03 NOTE — Procedures (Signed)
Central Venous Catheter Insertion Procedure Note Courtney [name] [id number] 08/20/1940  Procedure: Insertion of Central Venous Catheter Indications: Assessment of intravascular volume  Procedure Details Consent: Risks of procedure as well as the alternatives and risks of each were explained to the (patient/caregiver).  Consent for procedure obtained. Consent obtained from daughter. Time Out: Verified patient identification, verified procedure, site/side was marked, verified correct patient position, special equipment/implants available, medications/allergies/relevent history reviewed, required imaging and test results available.  Performed  Maximum sterile technique was used including antiseptics, cap, gloves, gown, hand hygiene, mask and sheet. Skin prep: Chlorhexidine; local anesthetic administered A antimicrobial bonded/coated triple lumen catheter was placed in the left subclavian vein using the Seldinger technique.  Evaluation Blood flow good Complications: No apparent complications Patient did tolerate procedure well. Chest X-ray ordered to verify placement.  CXR: pending.  Shellia Cleverly 08/03/2018, 9:08 PM

## 2018-08-03 NOTE — Progress Notes (Signed)
PCCM Note   Called to bedside reference new consult for GIB on peripheral levophed now with no PIV access and hypotensive.   Left EJ 18g x 1 attempt placed till further access can be established.    Pending formal consult.   Kennieth Rad, MSN, AGACNP-BC [facility] Pgr: [phone] or if no answer [phone] 08/03/2018, 9:19 PM

## 2018-08-03 NOTE — Op Note (Signed)
[facility] Patient Name: Courtney [name] Procedure Date : 08/03/2018 MRN: [id number] Attending MD: Clarene Essex , MD Date of Birth: [birth year] CSN: [id number] Age: 78 Admit Type: Inpatient Procedure:                Upper GI endoscopy Indications:              Hematochezia but abnormal bleeding scan worrisome                            for upper tract bleeding Providers:                Clarene Essex, MD, Glori Bickers, RN, Elspeth Cho,                            Technician Referring MD:              Medicines:                None for this procedure but she was sedated from                            her intubation Complications:            No immediate complications. Estimated Blood Loss:     Estimated blood loss: none. Procedure:                Pre-Anesthesia Assessment:                           - Prior to the procedure, a History and Physical                            was performed, and patient medications and                            allergies were reviewed. The patient's tolerance of                            previous anesthesia was also reviewed. The risks                            and benefits of the procedure and the sedation                            options and risks were discussed with the patient.                            All questions were answered, and informed consent                            was obtained. Prior Anticoagulants: The patient has                            taken Plavix (clopidogrel), last dose was 4 days  prior to procedure. ASA Grade Assessment: III - A                            patient with severe systemic disease. After                            reviewing the risks and benefits, the patient was                            deemed in satisfactory condition to undergo the                            procedure.                           After obtaining informed consent, the endoscope was   passed under direct vision. Throughout the                            procedure, the patient's blood pressure, pulse, and                            oxygen saturations were monitored continuously. The                            [id number] ([id number] Olympus therapeutic                            gastroscope was introduced through the mouth, and                            advanced to the third part of duodenum. The upper                            GI endoscopy was accomplished without difficulty.                            The patient tolerated the procedure well. Scope In: Scope Out: Findings:      A ?tiny hiatal hernia was present.      A medium amount of food (residue) was found in the gastric fundus, in       the gastric body and in the gastric antrum.      The duodenal bulb and third portion of the duodenum were normal.      A single small angiodysplastic lesion with stigmata of recent bleeding       was found in the second portion of the duodenum. Area was successfully       injected with 2.5 mL of a 1:10,000 solution of epinephrine for       hemostasis over 2 injections. Fulguration to ablate the lesion to       prevent bleeding by gold probe was successful.      The exam was otherwise without abnormality. Impression:               - ?tiny hiatal hernia.                           -  A medium amount of food (residue) in the stomach.                           - Normal duodenal bulb and third portion of the                            duodenum.                           - A single recently bleeding angiodysplastic lesion                            in the duodenum. Injected. Treated with a gold                            probe.                           - The examination was otherwise normal.                           - No specimens collected. Recommendation:           - NPO today.                           - Continue present medications.                           - Return to GI clinic  PRN.                           - Telephone GI clinic if symptomatic PRN. If signs                            of ongoing blood loss repeat nuclear medicine scan                            and she would not need to be reinjected if it                            occurs between now and tomorrow at 2 PM since the                            tracer should last 24 hours and if positive for                            colon or lower small bowel bleeding reconsult IR                            and my partner Dr. Paulita Fujita to check on tomorrow Procedure Code(s):        --- Professional ---                           [phone], Esophagogastroduodenoscopy, flexible,  transoral; with control of bleeding, any method Diagnosis Code(s):        --- Professional ---                           K44.9, Diaphragmatic hernia without obstruction or                            gangrene                           K31.811, Angiodysplasia of stomach and duodenum                            with bleeding                           K92.1, Melena (includes Hematochezia) CPT copyright 2019 [employer]. All rights reserved. The codes documented in this report are preliminary and upon coder review may  be revised to meet current compliance requirements. Clarene Essex, MD 08/03/2018 10:46:58 PM This report has been signed electronically. Number of Addenda: 0

## 2018-08-04 ENCOUNTER — Inpatient Hospital Stay (HOSPITAL_COMMUNITY): Payer: Medicare Other

## 2018-08-04 ENCOUNTER — Encounter (HOSPITAL_COMMUNITY): Payer: Self-pay | Admitting: Gastroenterology

## 2018-08-04 DIAGNOSIS — L8990 Pressure ulcer of unspecified site, unspecified stage: Secondary | ICD-10-CM | POA: Diagnosis present

## 2018-08-04 LAB — BLOOD GAS, ARTERIAL
Acid-base deficit: 3.8 mmol/L — ABNORMAL HIGH (ref 0.0–2.0)
Bicarbonate: 20.2 mmol/L (ref 20.0–28.0)
Drawn by: 13746
FIO2: 40
O2 Saturation: 99.4 %
PEEP: 5 cmH2O
Patient temperature: 98.6
Pressure support: 5 cmH2O
pCO2 arterial: 33.6 mmHg (ref 32.0–48.0)
pH, Arterial: 7.396 (ref 7.350–7.450)
pO2, Arterial: 182 mmHg — ABNORMAL HIGH (ref 83.0–108.0)

## 2018-08-04 LAB — RENAL FUNCTION PANEL
Albumin: 2.4 g/dL — ABNORMAL LOW (ref 3.5–5.0)
Anion gap: 14 (ref 5–15)
BUN: 70 mg/dL — ABNORMAL HIGH (ref 8–23)
CO2: 20 mmol/L — ABNORMAL LOW (ref 22–32)
Calcium: 7.7 mg/dL — ABNORMAL LOW (ref 8.9–10.3)
Chloride: 103 mmol/L (ref 98–111)
Creatinine, Ser: 4.93 mg/dL — ABNORMAL HIGH (ref 0.44–1.00)
GFR calc Af Amer: 9 mL/min — ABNORMAL LOW
GFR calc non Af Amer: 8 mL/min — ABNORMAL LOW
Glucose, Bld: 150 mg/dL — ABNORMAL HIGH (ref 70–99)
Phosphorus: 4.2 mg/dL (ref 2.5–4.6)
Potassium: 4.1 mmol/L (ref 3.5–5.1)
Sodium: 137 mmol/L (ref 135–145)

## 2018-08-04 LAB — HEMOGLOBIN AND HEMATOCRIT, BLOOD
HCT: 22.9 % — ABNORMAL LOW (ref 36.0–46.0)
HCT: 23.8 % — ABNORMAL LOW (ref 36.0–46.0)
Hemoglobin: 7.8 g/dL — ABNORMAL LOW (ref 12.0–15.0)
Hemoglobin: 8.1 g/dL — ABNORMAL LOW (ref 12.0–15.0)

## 2018-08-04 LAB — GLUCOSE, CAPILLARY
Glucose-Capillary: 99 mg/dL (ref 70–99)
Glucose-Capillary: 129 mg/dL — ABNORMAL HIGH (ref 70–99)
Glucose-Capillary: 112 mg/dL — ABNORMAL HIGH (ref 70–99)
Glucose-Capillary: 96 mg/dL (ref 70–99)
Glucose-Capillary: 61 mg/dL — ABNORMAL LOW (ref 70–99)
Glucose-Capillary: 104 mg/dL — ABNORMAL HIGH (ref 70–99)
Glucose-Capillary: 90 mg/dL (ref 70–99)

## 2018-08-04 LAB — CBC
HCT: 25.7 % — ABNORMAL LOW (ref 36.0–46.0)
Hemoglobin: 8.9 g/dL — ABNORMAL LOW (ref 12.0–15.0)
MCH: 31.1 pg (ref 26.0–34.0)
MCHC: 34.6 g/dL (ref 30.0–36.0)
MCV: 89.9 fL (ref 80.0–100.0)
Platelets: 90 10*3/uL — ABNORMAL LOW (ref 150–400)
RBC: 2.86 MIL/uL — ABNORMAL LOW (ref 3.87–5.11)
RDW: 14.8 % (ref 11.5–15.5)
WBC: 15.8 10*3/uL — ABNORMAL HIGH (ref 4.0–10.5)
nRBC: 0 % (ref 0.0–0.2)

## 2018-08-04 MED ORDER — DEXTROSE 50 % IV SOLN
INTRAVENOUS | Status: AC
  Administered 2018-08-04: 20:00:00 25 mL
  Filled 2018-08-04: qty 50

## 2018-08-04 MED ORDER — DARBEPOETIN ALFA 150 MCG/0.3ML IJ SOSY
150.0000 ug | PREFILLED_SYRINGE | Freq: Once | INTRAMUSCULAR | Status: AC
  Administered 2018-08-05: 10:00:00 150 ug via INTRAVENOUS
  Filled 2018-08-04: qty 0.3

## 2018-08-04 NOTE — Progress Notes (Signed)
[facility] Progress Note    Assessment/ Plan:   78 year old BF with multiple medical problems including ESRD. Presents with weakness found to have hgb of 5.7 1hgb of 5.7- on 5/27 was 8.5 when she came for access procedure. Son denies overt bleeding. Do not have recent information from the kidney center about iron or ESA dosing.s/p2 units on 6/6.Dosedwith ESA. GI initially did not to consider invasive eval bec of high mortality risk w/ the procedure but bec of recurrent bldg performed a EGD with enteroscopy 6/10 -> ooze from AVM in 2nd portion of the duodenum. They are not convinced that accounts for all the blood loss.   2 ESRD:normally TTS at [facility] via AVG. Last HD was Monday.   Plan was to HD today to get back on schedule but I am going to hold in light of the events overnight. Will try again Fri for 3hr and limited UF.   3 Hypertension:BP is low- looks like at home is on hydralazine and lasix- being held.   5. Metabolic Bone Disease - Will check a phos Sat  Subjective:   Bleeding again overnight -> EGD with enteroscopy 6//10 finding only AVM in 2nd portion of duodenum w/ slow ooze.  Currently on pressors and didn't tolerate weaning this AM.   Objective:   BP (!) 134/24   Pulse 62   Temp 98.3 F (36.8 C) (Oral)   Resp 13   Ht _0  (1.499 m)   Wt 30.3 kg   SpO2 100%   BMI 13.49 kg/m   Intake/Output Summary (Last 24 hours) at 08/04/2018 1428 Last data filed at 08/04/2018 0753 Gross per 24 hour  Intake 497.96 ml  Output 350 ml  Net 147.96 ml   Weight change:   Physical Exam: General:Intubated  Heart:RRR Lungs:mostly clear Abdomen:nontender Extremities:really no edema Dialysis Access:RUA AVGgood bruit  Imaging: Dg Abd 1 View  Result Date: 08/04/2018 CLINICAL DATA:  Check NG placement EXAM: ABDOMEN - 1 VIEW COMPARISON:  None. FINDINGS: Gastric catheter is noted within the stomach. Stomach remains distended with air.  Large left-sided pleural effusion is seen. No free air is noted. No acute bony abnormality is noted. IMPRESSION: Gastric catheter within the stomach. Electronically Signed   By: Inez Catalina M.D.   On: 08/04/2018 08:12   Nm Gi Blood Loss  Result Date: 08/03/2018 CLINICAL DATA:  Bloody stools, anemia, hypotensive EXAM: NUCLEAR MEDICINE GASTROINTESTINAL BLEEDING SCAN TECHNIQUE: Sequential abdominal images were obtained following intravenous administration of Tc-25mlabeled red blood cells. RADIOPHARMACEUTICALS:  23.5 mCi Tc-976mertechnetate in-vitro labeled red cells. COMPARISON:  CT abdomen pelvis, 07/30/2018 FINDINGS: There is abnormal radiotracer localization, which when comparing to anatomic configuration on prior CT appears to be within the gastric lumen or perhaps the proximal duodenum, and is noted to peristalsis throughout the small bowel and proximal colon for the duration of the study. IMPRESSION: Positive tagged red blood cell examination for gastrointestinal bleed, with a very proximal source, either the stomach or duodenum. Electronically Signed   By: AlEddie Candle.D.   On: 08/03/2018 14:58   Dg Chest Port 1 View  Result Date: 08/03/2018 CLINICAL DATA:  89ear old female intubated. EXAM: PORTABLE CHEST 1 VIEW COMPARISON:  07/30/2018 and earlier. FINDINGS: Portable AP semi upright view at 2107 hours. Endotracheal tube tip located about 18 millimeters above the carina. Left subclavian central line has been placed, tip at the level of the carina. No enteric tube. No pneumothorax. Continued dense opacification of the left  lower lung most resembling a moderate size pleural effusion. No pulmonary edema. Stable visible mediastinal contours. Nonspecific visible bowel gas pattern. Right axillary vascular stent again noted. IMPRESSION: 1. Endotracheal tube tip about 18 mm above the carina. 2. Left subclavian central line placed, tip at the SVC level. 3. Continued moderate left pleural effusion.  Electronically Signed   By: Genevie Ann M.D.   On: 08/03/2018 21:42    Labs: BMET Recent Labs  Lab 07/30/18 1406 08/01/18 0202 08/03/18 0247 08/03/18 2231 08/03/18 2248 08/04/18 0610  NA 142 141 139 136 136 137  K 3.6 4.3 3.5 4.0 4.3 4.1  CL 102 105 103  --   --  103  CO2 _0 --   --  20*  GLUCOSE 126* 95 95  --   --  150*  BUN 64* 94* 46*  --   --  70*  CREATININE 5.37* 6.29* 4.00*  --   --  4.93*  CALCIUM 7.9* 7.3* 7.7*  --   --  7.7*  PHOS  --   --  4.2  --   --  4.2   CBC Recent Labs  Lab 07/30/18 1406  08/02/18 0332 08/03/18 0247  08/03/18 2015 08/03/18 2231 08/03/18 2248 08/04/18 0610  WBC 7.3   < > 6.5 8.2  --  10.8*  --   --  15.8*  NEUTROABS 5.9  --   --   --   --   --   --   --   --   HGB 5.7*   < > 8.2* 6.3*   < > 7.1* 6.8* 6.8* 8.9*  HCT 18.7*   < > 25.1* 19.0*   < > 21.4* 20.0* 20.0* 25.7*  MCV 97.4   < > 89.6 90.9  --  91.5  --   --  89.9  PLT 145*   < > 88* 100*  --  71*  --   --  90*   < > = values in this interval not displayed.    Medications:    . sodium chloride   Intravenous Once  . sodium chloride   Intravenous Once  . chlorhexidine gluconate (MEDLINE KIT)  15 mL Mouth Rinse BID  . Chlorhexidine Gluconate Cloth  6 each Topical Q0600  . darbepoetin (ARANESP) injection - DIALYSIS  150 mcg Intravenous Q Thu-HD  . escitalopram  5 mg Oral Daily  . mouth rinse  15 mL Mouth Rinse BID  . mouth rinse  15 mL Mouth Rinse 10 times per day  . midazolam  2 mg Intravenous Once  . multivitamin  1 tablet Oral QHS  . Pancrealipase (Lip-Prot-Amyl) CPEP 6,000 units  1 capsule Oral TID WC  . pantoprazole (PROTONIX) IV  40 mg Intravenous Q24H      Otelia Santee, MD 08/04/2018, 2:28 PM

## 2018-08-04 NOTE — Progress Notes (Signed)
NAME:  Courtney [name], MRN:  [id number], DOB:  06-22-1940, LOS: 5 ADMISSION DATE:  07/30/2018, CONSULTATION DATE:  08/03/2018 REFERRING MD:  TRH, CHIEF COMPLAINT:  Hypotension/ GIB  Brief History   78 year old female admitted 6/6 with hypotension and Hgb of 5.7.  Over the course, she started having rectal bleeding requiring transfusions.  On 6/10, had ongoing bleeding becoming hypotensive requiring vasopressors, GI and IR evaluating.    History of present illness   HPI mostly obtained from medical chart review as patient is poor historian.   78 year old female with history of ESRD - TTS HD, diastolic HF, stroke- on ASA and plavix, CAD, DM, HTN, and HLD admitted 6/6 with hypotension and Hgb of 5.7.  ASA and plavix held.  She was transfused 2 units of PRBC and admitted for symptomatic anemia.  GI and nephrology consulting.  She was hemoccult positive.  CT abd/pelvis showed no source of bleeding or hematoma, but small amount of gas in the endometrial canal of uncertain etiology with bilateral pleural effusions.  GYN was consulted and recommended no further workup.  Endoscopy was held off given her frailty and high risk.  She started having bloody stool overnight 6/7 and again transfused 1 unit PRBC 6/8. Bowel movements turned blackish/ brown with Hgb stable until she progressively became hypotensive 6/10 with ongoing bloody stools.  She was given given IVF, 1 unit PRBC and transferred to ICU for close monitoring.  Underwent RBC tagged study which was read as positive for GIB with proximal source either stomach or duodenum.  IR was consulted for possible arteriogram and embolization procedure however he thought there was very minimal bleeding to confidently identify bleeding source and recommended GI re-evaluate for either endoscopy or CTA abd/ pelvis.  While in ICU she loss her PIV access while on levophed and PCCM consulted to assist with further medical management and stabilization.     Past Medical History   ESRD - TTS HD, diastolic HF, stroke, CAD, DM, HTN, HLD  Significant Hospital Events   6/6 Admitted  6/10 tx to ICU  Consults:  GI Nephrology IR   Procedures:   Significant Diagnostic Tests:  6/6 CT abd/ pelvis >> 1. Small amount of gas in the endometrial canal. This is of uncertain etiology and significance, but is abnormal, and could indicate endometritis with infection from gas-forming organisms, or could be related to recent procedure. Further clinical evaluation is recommended. 2. No definite source for blood loss confidently identified in the abdomen or pelvis. 3. Small volume of ascites. 4. Large left and moderate right pleural effusions with extensive passive atelectasis in the lower lobes of the lungs bilaterally. 5. Aortic atherosclerosis, in addition to left main and 3 vessel coronary artery disease. Assessment for potential risk factor modification, dietary therapy or pharmacologic therapy may be warranted, if clinically indicated. 6. Additional incidental findings, as above.  6/10 RBC tagged study >> Positive tagged red blood cell examination for gastrointestinal bleed, with a very proximal source, either the stomach or duodenum. 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  Micro Data:  6/6 SARS coronavirus 2 >> negative 6/8 MRSA PCR >> neg  Antimicrobials:  n/a  Interim history/subjective:   Currently awake on full mechanical ventilatory support assessing for spontaneous breathing trial.  Objective   Blood pressure (!) 128/54, pulse 64, temperature 97.6 F (36.4 C), temperature source Oral, resp. rate 15, height 4\' 11"  (1.499 m), weight 30.3 kg, SpO2 100 %.    Vent  Mode: PSV;CPAP FiO2 (%):  [40 %-100 %] 40 % Set Rate:  [15 bmp] 15 bmp Vt Set:  [350 mL] 350 mL PEEP:  [5 cmH20] 5 cmH20 Pressure Support:  [10 cmH20] 10 cmH20 Plateau Pressure:  [21 cmH20-23 cmH20] 21 cmH20   Intake/Output Summary (Last 24 hours) at 08/04/2018 0801 Last data filed at  08/04/2018 0753 Gross per 24 hour  Intake 1095.96 ml  Output 350 ml  Net 745.96 ml   Filed Weights   08/01/18 1405 08/02/18 0230 08/04/18 0627  Weight: 34 kg 36.6 kg 30.3 kg   Examination: General: Frail cachectic female who follows commands. HEENT: Endotracheal tube in place.  NG tube in place and is clamped Neuro: Arouses to voice follows commands CV: Sounds are distant but regular PULM: even/non-labored, lungs bilaterally decreased in bases left greater than right BU:LAGT, non-tender, bsx4 active  Extremities: warm/dry, negative edema right bicep AV graft positive bruit and thrill Skin: no rashes or lesions   Resolved Hospital Problem list    Assessment & Plan:   GIB- presumed from stomach vs duodenum by RBC study with increasing bloody stools 6/10 and hemodynamic instability P:  Case discussed with Dr. Watt Climes on call for GI given IR reommendations and feels we should proceed with endoscopy if daughter consents.  After discussion with daughter, Courtney [name] with options to treat symptomatically and monitor or proceeding with Intubation/ Endoscopy, she is not comfortable "doing nothing" would like to proceed with endoscopy despite high risk for complications.  We will place CVL for better access and intubate patient for procedure in attempts to medical stabilize her as best able for procedure.   Pending Hgb, will transfuse for Hgb < 7 PPI   Shock- related to ABLA/ GIB Recent Labs    08/03/18 2248 08/04/18 0610  HGB 6.8* 8.9*    P:  Transfuse per protocol Wean Levophed off   Acute respiratory insufficiency in the setting of procedure P:  Intubated for endoscopy. Assess for extubation Awake and follows commands Noted to have left pleural effusion on chest x-ray 08/03/1998   Hypoglycemia  - s/p glucagon CBG (last 3)  Recent Labs    08/03/18 2320 08/04/18 0333 08/04/18 0714  GLUCAP 185* 99 129*    P:  Trend glucose with sliding scale insulin protocol   ESRD  - last iHD 6/8 P:  Hemodialysis per nephrology she is due 08/04/2018    Hx HTN P:  Holding Lasix and hydralazine for now  CAD/ hx CVA P:  Continue to hold home aspirin and Plavix for now   Best practice:  Diet: NPO Pain/Anxiety/Delirium protocol (if indicated): prn fentanyl VAP protocol (if indicated): yes DVT prophylaxis: SCDs only  GI prophylaxis: PPI Glucose control: CBG q4, q1 prn still stablizes Mobility: BR Code Status: full  Family Communication: Daughter, Courtney [name] updated by phone by Dr. Mariane Masters, [phone] Disposition: ICU  Labs   CBC: Recent Labs  Lab 07/30/18 1406  08/01/18 0202  08/02/18 0332 08/03/18 0247 08/03/18 1625 08/03/18 2015 08/03/18 2231 08/03/18 2248 08/04/18 0610  WBC 7.3  --  7.5  --  6.5 8.2  --  10.8*  --   --  15.8*  NEUTROABS 5.9  --   --   --   --   --   --   --   --   --   --   HGB 5.7*   < > 6.5*   < > 8.2* 6.3* 8.5* 7.1* 6.8* 6.8* 8.9*  HCT 18.7*   < >  19.9*   < > 25.1* 19.0* 25.7* 21.4* 20.0* 20.0* 25.7*  MCV 97.4  --  93.0  --  89.6 90.9  --  91.5  --   --  89.9  PLT 145*  --  121*  --  88* 100*  --  71*  --   --  90*   < > = values in this interval not displayed.    Basic Metabolic Panel: Recent Labs  Lab 07/30/18 1406 08/01/18 0202 08/03/18 0247 08/03/18 2231 08/03/18 2248 08/04/18 0610  NA 142 141 139 136 136 137  K 3.6 4.3 3.5 4.0 4.3 4.1  CL 102 105 103  --   --  103  CO2 26 22 25   --   --  20*  GLUCOSE 126* 95 95  --   --  150*  BUN 64* 94* 46*  --   --  70*  CREATININE 5.37* 6.29* 4.00*  --   --  4.93*  CALCIUM 7.9* 7.3* 7.7*  --   --  7.7*  PHOS  --   --  4.2  --   --  4.2   GFR: Estimated Creatinine Clearance: 4.6 mL/min (A) (by C-G formula based on SCr of 4.93 mg/dL (H)). Recent Labs  Lab 08/02/18 0332 08/03/18 0247 08/03/18 2015 08/04/18 0610  WBC 6.5 8.2 10.8* 15.8*    Liver Function Tests: Recent Labs  Lab 07/30/18 1406 08/01/18 0202 08/04/18 0610  AST 53* 36  --   ALT 113* 81*  --    ALKPHOS 64 62  --   BILITOT 0.6 0.9  --   PROT 5.2* 4.8*  --   ALBUMIN 2.5* 2.4* 2.4*   No results for input(s): LIPASE, AMYLASE in the last 168 hours. No results for input(s): AMMONIA in the last 168 hours.  ABG    Component Value Date/Time   PHART 7.428 08/03/2018 2248   PCO2ART 32.4 08/03/2018 2248   PO2ART 497.0 (H) 08/03/2018 2248   HCO3 21.7 08/03/2018 2248   TCO2 23 08/03/2018 2248   ACIDBASEDEF 3.0 (H) 08/03/2018 2248   O2SAT 100.0 08/03/2018 2248     Coagulation Profile: Recent Labs  Lab 08/03/18 2015  INR 1.3*    Cardiac Enzymes: No results for input(s): CKTOTAL, CKMB, CKMBINDEX, TROPONINI in the last 168 hours.  HbA1C: Hgb A1c MFr Bld  Date/Time Value Ref Range Status  07/14/2017 04:32 AM 4.3 (L) 4.8 - 5.6 % Final    Comment:    (NOTE) Pre diabetes:          5.7%-6.4% Diabetes:              >6.4% Glycemic control for   <7.0% adults with diabetes   07/17/2012 03:50 PM 5.9 (H) <5.7 % Final    Comment:    (NOTE)                                                                       According to the ADA Clinical Practice Recommendations for 2011, when HbA1c is used as a screening test:  >=6.5%   Diagnostic of Diabetes Mellitus           (if abnormal result is confirmed) 5.7-6.4%   Increased risk of developing Diabetes  [place] References:Diagnosis and Classification of Diabetes Mellitus,Diabetes MWUX,3244,01(UUVOZ 1):S62-S69 and Standards of Medical Care in         Diabetes - 2011,Diabetes DGUY,4034,74 (Suppl 1):S11-S61.    CBG: Recent Labs  Lab 08/03/18 2013 08/03/18 2144 08/03/18 2320 08/04/18 0333 08/04/18 0714  GLUCAP 146* 222* 185* 99 129*     Critical care time: 30 mins    Richardson Landry Tyshia Fenter ACNP Maryanna Shape PCCM Pager [phone] till 1 pm If no answer page [phone] 08/04/2018, 8:01 AM

## 2018-08-04 NOTE — Progress Notes (Signed)
100 mcg of Fentanyl wasted in Pyxis with Jabier Gauss, RN as witness. The wrong vial of Fentanyl was clicked on to waste in pyxis and it shows in the endoscopy pyxis as undocumented waste. This RN called Pharmacy to question how to fix this and pharmacy informed this RN that as long as it was wasted under the pt name, even if it was a different vial of fentanyl the waste will show up correctly after the patient is discharged.

## 2018-08-04 NOTE — Progress Notes (Signed)
Subjective: Events overnight noted. No bleeding overnight. Still on pressors.  Objective: Vital signs in last 24 hours: Temp:  [97.3 F (36.3 C)-97.8 F (36.6 C)] 97.6 F (36.4 C) (06/11 0334) Pulse Rate:  [44-128] 64 (06/11 0700) Resp:  [9-21] 15 (06/11 0700) BP: (62-221)/(21-91) 128/54 (06/11 0700) SpO2:  [64 %-100 %] 100 % (06/11 0700) Arterial Line BP: (111-134)/(21-62) 119/40 (06/11 0700) FiO2 (%):  [40 %-100 %] 40 % (06/11 0700) Weight:  [30.3 kg] 30.3 kg (06/11 0627) Weight change:  Last BM Date: 08/03/18  PE: GEN:  Intubated, can open eyes ABD:  Soft, non-tender  Lab Results: CBC    Component Value Date/Time   WBC 15.8 (H) 08/04/2018 0610   RBC 2.86 (L) 08/04/2018 0610   HGB 8.9 (L) 08/04/2018 0610   HCT 25.7 (L) 08/04/2018 0610   PLT 90 (L) 08/04/2018 0610   MCV 89.9 08/04/2018 0610   MCH 31.1 08/04/2018 0610   MCHC 34.6 08/04/2018 0610   RDW 14.8 08/04/2018 0610   LYMPHSABS 0.7 07/30/2018 1406   MONOABS 0.6 07/30/2018 1406   EOSABS 0.0 07/30/2018 1406   BASOSABS 0.0 07/30/2018 1406   CMP     Component Value Date/Time   NA 137 08/04/2018 0610   K 4.1 08/04/2018 0610   CL 103 08/04/2018 0610   CO2 20 (L) 08/04/2018 0610   GLUCOSE 150 (H) 08/04/2018 0610   BUN 70 (H) 08/04/2018 0610   CREATININE 4.93 (H) 08/04/2018 0610   CREATININE 3.15 (H) 02/10/2017 1139   CALCIUM 7.7 (L) 08/04/2018 0610   PROT 4.8 (L) 08/01/2018 0202   ALBUMIN 2.4 (L) 08/04/2018 0610   AST 36 08/01/2018 0202   ALT 81 (H) 08/01/2018 0202   ALKPHOS 62 08/01/2018 0202   BILITOT 0.9 08/01/2018 0202   GFRNONAA 8 (L) 08/04/2018 0610   GFRAA 9 (L) 08/04/2018 0610   Assessment:  1. Anemia, hemoccult-positive with intermittent gross episodes of bleeding.CT abdomen without contrast showed no hematoma or source of bleeding.  2. Altered mental status, improving. 3. Hypotension, recurrent, now improving. Intermittent episodes of bleeding.  "EGD 6/10 with duodenal oozing AVM  that was treated. 4. Chronic anticoagulation: Aspirin and clopidigrel on hold. 5. Multiple medical problems: CHF, history MI, history stroke.  Plan:  1.  Overall not clear if oozing AVM in duodenum that was treated is sole root of patient's recurrent anemia and GI bleeding; while it is hopeful this is the cause, and she's not having any bleeding since procedure, the trend over the past few days has been episodes of bleeding admixed with lengthy periods without bleeding. 2.  PPI. 3.  Volume repletion, serial CBCs, blood tranfusion as needed. 4.  No antiplatelet/anticoagulants.  Patient may ultimately be ok with aspirin, but suspect risks of resuming clopidigrel at any point in future far outweigh the benefits. 5.  If recurrent bleeding, would do again tagged RBC study, and if positive in same area would very much doubt the source was the small oozing area in the duodenum seen on last night's endoscopy, as at time of endoscopy there was only minimal blood in duodenum and none in stomach (with bleeding enough to cause + tagged RBC study would expect large amounts of blood an clots at lesion site).  In this case (if repeat tagged RBC study has to be done and if again positive), would ask IR again to revisit possibility of angiogram and embolization, as we will have maximized endoscopic therapies. 6.  [facility] will follow.  Landry Dyke 08/04/2018, 9:09 AM   Cell [phone] If no answer or after 5 PM call [phone]

## 2018-08-04 NOTE — Progress Notes (Signed)
Moshe Cipro, NP at bedside. Left EJ inserted by NP. Informed NP accuracy of pt bps are questionable. Addressed need for a-line with NP

## 2018-08-04 NOTE — Procedures (Signed)
Extubation Procedure Note  Patient Details:   Name: Courtney [name] DOB: 02-26-1940 MRN: [id number]   Airway Documentation:    Vent end date: 08/04/18 Vent end time: 1543   Evaluation  O2 sats: stable throughout Complications: No apparent complications Patient did tolerate procedure well. Bilateral Breath Sounds: Clear   Yes   Pt extubated without difficulty per order by Dr. Ruthann Cancer.  Pt placed on 2 l/m with o2 saturations 100%.  Earney Navy 08/04/2018, 3:53 PM

## 2018-08-04 NOTE — Progress Notes (Signed)
Nutrition Follow-up  DOCUMENTATION CODES:   Underweight  INTERVENTION:   If unable to extubate, recommend begin TF via OGT:   Vital AF 1.2 at 35 ml/h (840 ml per day)   Provides 1008 kcal, 63 gm protein, 681 ml free water daily  NUTRITION DIAGNOSIS:   Severe Malnutrition related to chronic illness as evidenced by severe fat depletion, severe muscle depletion.  Ongoing  GOAL:   Patient will meet greater than or equal to 90% of their needs  Unmet  MONITOR:   Vent status, Labs, Skin  REASON FOR ASSESSMENT:   Ventilator    ASSESSMENT:   Courtney [name] is a 78 y.o. female with medical history significant of ESRD ON HD, TThsat, diastolic heart failure, stroke, CAD , DM, hypertension, hyperlipidemia presents to ED from HD for hypotension. Minimal history from the patient, her son at bedside and reports she has been lethargic, slightly confused and weak since yesterday. No bloody stools at home.   Patient required transfer to the ICU and intubation on 6/10. Per discussion with RN, she may be extubated today. GI team following for GI bleeding.  Patient is currently intubated on ventilator support MV: 4.5 L/min Temp (24hrs), Avg:97.7 F (36.5 C), Min:97.3 F (36.3 C), Max:98.3 F (36.8 C)   Labs reviewed. BUN 70 (H), creatinine 4.93 (H) CBG's: [phone]  Medications reviewed and include Rena-vit, Pancrealipase, Levophed.    NUTRITION - FOCUSED PHYSICAL EXAM:    Most Recent Value  Orbital Region  Severe depletion  Upper Arm Region  Mild depletion  Thoracic and Lumbar Region  Severe depletion  Buccal Region  Severe depletion  Temple Region  Severe depletion  Clavicle Bone Region  Moderate depletion  Clavicle and Acromion Bone Region  Severe depletion  Scapular Bone Region  Unable to assess  Dorsal Hand  Severe depletion  Patellar Region  Severe depletion  Anterior Thigh Region  Severe depletion  Posterior Calf Region  Severe depletion  Edema (RD Assessment)   None  Hair  Reviewed  Eyes  Unable to assess  Mouth  Unable to assess  Skin  Reviewed  Nails  Reviewed       Diet Order:   Diet Order            Diet NPO time specified  Diet effective now              EDUCATION NEEDS:   No education needs have been identified at this time  Skin:  Skin Assessment: Skin Integrity Issues: Skin Integrity Issues:: Stage II Stage II: sacrum  Last BM:  6/10  Height:   Ht Readings from Last 1 Encounters:  08/01/18 4\' 11"  (1.499 m)    Weight:   Wt Readings from Last 1 Encounters:  08/04/18 30.3 kg   08/02/18   36.6 kg          08/01/18   34 kg          08/01/18   33.9 kg             Ideal Body Weight:  44.5 kg  BMI:  Body mass index is 13.49 kg/m.  Estimated Nutritional Needs:   Kcal:  [phone]  Protein:  55-65 gm  Fluid:  1033ml + UOP    [name], RD, LDN, CNSC Pager [phone] After Hours Pager [phone]

## 2018-08-04 NOTE — Progress Notes (Signed)
  Cindee Salt, RN  Registered Nurse  Nursing  Progress Notes  Signed  Date of Service:  08/03/2018 7:15 PM          Signed         IR physician at bedside to assess pt when nursing noted pt cbg 36. LPIV infiltrated while D50 IV push being administered by nursing. Glucagon IM given , levophed via LPIV stopped, pt placed in trendelenberg position and link nurse Abby notified pt need assessment for central line insertion. Bloody stool output in rectal pouch shown to md. Md explains pt needs an endoscopy and not an IR procedure. [facility] md paged for IR md to discuss plan of tx for pt.

## 2018-08-04 NOTE — Procedures (Signed)
Arterial Catheter Insertion Procedure Note Courtney [name] [id number] 05-19-1940  Procedure: Insertion of Arterial Catheter  Indications: Blood pressure monitoring  Procedure Details Consent: Unable to obtain consent because of altered level of consciousness. Time Out: Verified patient identification, verified procedure, site/side was marked, verified correct patient position, special equipment/implants available, medications/allergies/relevent history reviewed, required imaging and test results available.  Performed  Maximum sterile technique was used including antiseptics, cap, gloves, gown, hand hygiene, mask and sheet. Skin prep: Chlorhexidine; local anesthetic administered 20 gauge catheter was inserted into right femoral artery using the Seldinger technique.  Line sutured. Biopatch and sterile dressing applied.  ULTRASOUND GUIDANCE USED: YES Evaluation Blood flow good; BP tracing good. Complications: No apparent complications.  Kennieth Rad, MSN, AGACNP-BC [facility] Pgr: [phone] or if no answer [phone] 08/04/2018, 12:57 AM

## 2018-08-04 NOTE — Progress Notes (Signed)
Pt.'s subclavian CVC line is leaking what appears to be serosanguineous fluid. Dr. Ruthann Cancer assessed at bedside. Dressing changed and verbal orders to not use proximal port at this time.

## 2018-08-05 ENCOUNTER — Inpatient Hospital Stay (HOSPITAL_COMMUNITY): Payer: Medicare Other

## 2018-08-05 DIAGNOSIS — R578 Other shock: Secondary | ICD-10-CM

## 2018-08-05 DIAGNOSIS — J90 Pleural effusion, not elsewhere classified: Secondary | ICD-10-CM

## 2018-08-05 DIAGNOSIS — D62 Acute posthemorrhagic anemia: Secondary | ICD-10-CM

## 2018-08-05 DIAGNOSIS — Z9889 Other specified postprocedural states: Secondary | ICD-10-CM

## 2018-08-05 DIAGNOSIS — J939 Pneumothorax, unspecified: Secondary | ICD-10-CM | POA: Diagnosis not present

## 2018-08-05 LAB — BODY FLUID CELL COUNT WITH DIFFERENTIAL
Lymphs, Fluid: 10 %
Monocyte-Macrophage-Serous Fluid: 48 % — ABNORMAL LOW (ref 50–90)
Neutrophil Count, Fluid: 42 % — ABNORMAL HIGH (ref 0–25)
Total Nucleated Cell Count, Fluid: 68 cu mm (ref 0–1000)

## 2018-08-05 LAB — CBC
HCT: 21.6 % — ABNORMAL LOW (ref 36.0–46.0)
Hemoglobin: 7.2 g/dL — ABNORMAL LOW (ref 12.0–15.0)
MCH: 31 pg (ref 26.0–34.0)
MCHC: 33.3 g/dL (ref 30.0–36.0)
MCV: 93.1 fL (ref 80.0–100.0)
Platelets: 80 10*3/uL — ABNORMAL LOW (ref 150–400)
RBC: 2.32 MIL/uL — ABNORMAL LOW (ref 3.87–5.11)
RDW: 16.4 % — ABNORMAL HIGH (ref 11.5–15.5)
WBC: 12.4 10*3/uL — ABNORMAL HIGH (ref 4.0–10.5)
nRBC: 0 % (ref 0.0–0.2)

## 2018-08-05 LAB — BASIC METABOLIC PANEL
Anion gap: 12 (ref 5–15)
BUN: 78 mg/dL — ABNORMAL HIGH (ref 8–23)
CO2: 21 mmol/L — ABNORMAL LOW (ref 22–32)
Calcium: 7.5 mg/dL — ABNORMAL LOW (ref 8.9–10.3)
Chloride: 108 mmol/L (ref 98–111)
Creatinine, Ser: 5.59 mg/dL — ABNORMAL HIGH (ref 0.44–1.00)
GFR calc Af Amer: 8 mL/min — ABNORMAL LOW (ref >60)
GFR calc non Af Amer: 7 mL/min — ABNORMAL LOW (ref >60)
Glucose, Bld: 86 mg/dL (ref 70–99)
Potassium: 4.3 mmol/L (ref 3.5–5.1)
Sodium: 141 mmol/L (ref 135–145)

## 2018-08-05 LAB — MAGNESIUM: Magnesium: 2 mg/dL (ref 1.7–2.4)

## 2018-08-05 LAB — PROTEIN, PLEURAL OR PERITONEAL FLUID: Total protein, fluid: <3 g/dL

## 2018-08-05 LAB — GLUCOSE, CAPILLARY
Glucose-Capillary: 75 mg/dL (ref 70–99)
Glucose-Capillary: 64 mg/dL — ABNORMAL LOW (ref 70–99)
Glucose-Capillary: 91 mg/dL (ref 70–99)
Glucose-Capillary: 62 mg/dL — ABNORMAL LOW (ref 70–99)
Glucose-Capillary: 90 mg/dL (ref 70–99)
Glucose-Capillary: 73 mg/dL (ref 70–99)
Glucose-Capillary: 60 mg/dL — ABNORMAL LOW (ref 70–99)
Glucose-Capillary: 114 mg/dL — ABNORMAL HIGH (ref 70–99)
Glucose-Capillary: 100 mg/dL — ABNORMAL HIGH (ref 70–99)

## 2018-08-05 LAB — PHOSPHORUS: Phosphorus: 4.7 mg/dL — ABNORMAL HIGH (ref 2.5–4.6)

## 2018-08-05 LAB — PROTEIN, TOTAL: Total Protein: 4.7 g/dL — ABNORMAL LOW (ref 6.5–8.1)

## 2018-08-05 LAB — LACTATE DEHYDROGENASE, PLEURAL OR PERITONEAL FLUID: LD, Fluid: 104 U/L — ABNORMAL HIGH (ref 3–23)

## 2018-08-05 LAB — HEMOGLOBIN AND HEMATOCRIT, BLOOD
HCT: 21 % — ABNORMAL LOW (ref 36.0–46.0)
HCT: 23 % — ABNORMAL LOW (ref 36.0–46.0)
Hemoglobin: 7.2 g/dL — ABNORMAL LOW (ref 12.0–15.0)
Hemoglobin: 7.7 g/dL — ABNORMAL LOW (ref 12.0–15.0)

## 2018-08-05 LAB — GLUCOSE, PLEURAL OR PERITONEAL FLUID: Glucose, Fluid: 101 mg/dL

## 2018-08-05 LAB — LACTATE DEHYDROGENASE: LDH: 203 U/L — ABNORMAL HIGH (ref 98–192)

## 2018-08-05 MED ORDER — DEXTROSE 50 % IV SOLN
25.0000 mL | Freq: Once | INTRAVENOUS | Status: AC
  Administered 2018-08-05: 08:00:00 25 mL via INTRAVENOUS

## 2018-08-05 MED ORDER — DEXTROSE 50 % IV SOLN
INTRAVENOUS | Status: AC
  Filled 2018-08-05: qty 50

## 2018-08-05 MED ORDER — DARBEPOETIN ALFA 150 MCG/0.3ML IJ SOSY
PREFILLED_SYRINGE | INTRAMUSCULAR | Status: AC
  Administered 2018-08-05: 10:00:00 150 ug via INTRAVENOUS
  Filled 2018-08-05: qty 0.3

## 2018-08-05 NOTE — Procedures (Signed)
Chest Tube Insertion Procedure Note  Indications:  Clinically significant Pneumothorax  Pre-operative Diagnosis: Pneumothorax  Post-operative Diagnosis: Pneumothorax  Procedure Details  Informed consent was obtained for the procedure, including sedation.  Risks of lung perforation, hemorrhage, arrhythmia, and adverse drug reaction were discussed.   After sterile skin prep, using standard technique, a 14 French tube was placed in the left lateral 6th rib space.  Findings: No air leak but good pleural variation with breathing  Estimated Blood Loss:  Minimal         Specimens:  None              Complications:  None; patient tolerated the procedure well.         Disposition: ICU - extubated and stable.         Condition: stable  Attending Attestation: I performed the procedure.  Leanna Sato Elsworth Soho MD

## 2018-08-05 NOTE — Progress Notes (Signed)
Hypoglycemic Event  CBG: 61  Treatment: 4 oz juice/soda  Symptoms: None  Follow-up CBG: [id number] CBG Result:114  Possible Reasons for Event: Inadequate meal intake  Comments/MD notified:elink    [place] Livings A

## 2018-08-05 NOTE — Progress Notes (Signed)
LPF, L wrist (initially documented incorrectly as right)

## 2018-08-05 NOTE — Progress Notes (Signed)
Subjective: Extubated. No further overt bleeding. Drop in Hgb.  Objective: Vital signs in last 24 hours: Temp:  [96.8 F (36 C)-98 F (36.7 C)] 97.3 F (36.3 C) (06/12 1100) Pulse Rate:  [61-74] 68 (06/12 1050) Resp:  [12-21] 19 (06/12 1050) BP: (71-151)/(15-82) 112/41 (06/12 1050) SpO2:  [95 %-100 %] 100 % (06/12 1050) Arterial Line BP: (94-130)/(9-39) 103/32 (06/12 1015) FiO2 (%):  [28 %-40 %] 28 % (06/11 1542) Weight:  [31.1 kg-31.8 kg] 31.1 kg (06/12 1050) Weight change:  Last BM Date: 08/03/18  PE: GEN:  Chronically ill-appearing, NAD  Lab Results: CBC    Component Value Date/Time   WBC 12.4 (H) 08/05/2018 0715   RBC 2.32 (L) 08/05/2018 0715   HGB 7.2 (L) 08/05/2018 0715   HCT 21.6 (L) 08/05/2018 0715   PLT 80 (L) 08/05/2018 0715   MCV 93.1 08/05/2018 0715   MCH 31.0 08/05/2018 0715   MCHC 33.3 08/05/2018 0715   RDW 16.4 (H) 08/05/2018 0715   LYMPHSABS 0.7 07/30/2018 1406   MONOABS 0.6 07/30/2018 1406   EOSABS 0.0 07/30/2018 1406   BASOSABS 0.0 07/30/2018 1406   CMP     Component Value Date/Time   NA 141 08/05/2018 0445   K 4.3 08/05/2018 0445   CL 108 08/05/2018 0445   CO2 21 (L) 08/05/2018 0445   GLUCOSE 86 08/05/2018 0445   BUN 78 (H) 08/05/2018 0445   CREATININE 5.59 (H) 08/05/2018 0445   CREATININE 3.15 (H) 02/10/2017 1139   CALCIUM 7.5 (L) 08/05/2018 0445   PROT 4.8 (L) 08/01/2018 0202   ALBUMIN 2.4 (L) 08/04/2018 0610   AST 36 08/01/2018 0202   ALT 81 (H) 08/01/2018 0202   ALKPHOS 62 08/01/2018 0202   BILITOT 0.9 08/01/2018 0202   GFRNONAA 7 (L) 08/05/2018 0445   GFRAA 8 (L) 08/05/2018 0445   Assessment:  1. Anemia, hemoccult-positivewith intermittent gross episodes of bleeding.CT abdomen without contrast showed no hematoma or source of bleeding.  2. Altered mental status, improving. 3. Hypotension,recurrent, nowimproving. Intermittent episodes of bleeding.  "EGD 6/10 with duodenal oozing AVM that was treated. No further overt  bleeding since endoscopy. 4. Chronic anticoagulation: Aspirin and clopidigrelon hold. 5. Multiple medical problems: CHF, history MI, history stroke.  Plan:  1.  Serial CBCs, PPI, volume repletion, transfusion as needed. 2.  If recurrent anemia persists without overt bleeding, might consider capsule endoscopy; if recurrent anemia persists with overt bleeding, might consider tagged RBC study +/- capsule endoscopy. 3.  Weigh risks:benefits of clopidigrel in future:  Can patient stay off clopidigrel long-term? 4.  Clear liquid diet ok from GI standpoint, can advance slowly as mental status and other medical matters tolerate. 5.  [facility] will follow.   Landry Dyke 08/05/2018, 11:17 AM   Cell [phone] If no answer or after 5 PM call [phone]

## 2018-08-05 NOTE — Procedures (Addendum)
Thoracentesis Procedure Note  Pre-operative Diagnosis: Left pleural effusion  Post-operative Diagnosis: same  Indications: LT pleural effusion  Procedure Details  Consent: Informed consent was obtained. Risks of the procedure were discussed including: infection, bleeding, pain, pneumothorax. Ultrasound was used to define location for thoracentesis  Under sterile conditions the patient was positioned. Betadine solution and sterile drapes were utilized.  2% plain plain was used to anesthetize the left 7th  rib space. Fluid was obtained without any difficulties and minimal blood loss.  A dressing was applied to the wound and wound care instructions were provided.   Findings 1000 ml of clear pleural fluid was obtained. A sample was sent to Pathology for cytology, and cell counts, as well as for chemistry & infection analysis.  Complications: Postprocedure chest x-ray shows left pneumothorax, chest tube will be placed        Condition: stable  Plan A follow up chest x-ray was ordered. Bed Rest for 2 hours. Tylenol 650 mg. for pain.  Attending Attestation: I performed the procedure.  Leanna Sato Elsworth Soho MD

## 2018-08-05 NOTE — Progress Notes (Signed)
NAME:  Courtney [name], MRN:  [id number], DOB:  09/27/1940, LOS: 6 ADMISSION DATE:  07/30/2018, CONSULTATION DATE:  08/03/2018 REFERRING MD:  TRH, CHIEF COMPLAINT:  Hypotension/ GIB  Brief History   78 year old female admitted 6/6 with hypotension and Hgb of 5.7.  Over the course, she started having rectal bleeding requiring transfusions.  On 6/10, had ongoing bleeding becoming hypotensive requiring vasopressors, GI and IR evaluating.    History of present illness   HPI mostly obtained from medical chart review as patient is poor historian.   78 year old female with history of ESRD - TTS HD, diastolic HF, stroke- on ASA and plavix, CAD, DM, HTN, and HLD admitted 6/6 with hypotension and Hgb of 5.7.  ASA and plavix held.  She was transfused 2 units of PRBC and admitted for symptomatic anemia.  GI and nephrology consulting.  She was hemoccult positive.  CT abd/pelvis showed no source of bleeding or hematoma, but small amount of gas in the endometrial canal of uncertain etiology with bilateral pleural effusions.  GYN was consulted and recommended no further workup.  Endoscopy was held off given her frailty and high risk.  She started having bloody stool overnight 6/7 and again transfused 1 unit PRBC 6/8. Bowel movements turned blackish/ brown with Hgb stable until she progressively became hypotensive 6/10 with ongoing bloody stools.  She was given given IVF, 1 unit PRBC and transferred to ICU for close monitoring.  Underwent RBC tagged study which was read as positive for GIB with proximal source either stomach or duodenum.  IR was consulted for possible arteriogram and embolization procedure however he thought there was very minimal bleeding to confidently identify bleeding source and recommended GI re-evaluate for either endoscopy or CTA abd/ pelvis.  While in ICU she loss her PIV access while on levophed and PCCM consulted to assist with further medical management and stabilization.     Past Medical History   ESRD - TTS HD, diastolic HF, stroke, CAD, DM, HTN, HLD  Significant Hospital Events   6/6 Admitted  6/10 tx to ICU  Consults:  GI Nephrology IR   Procedures:  08/03/2018 intubation>>8-11 08/03/2018 EGD Significant Diagnostic Tests:  6/6 CT abd/ pelvis >> 1. Small amount of gas in the endometrial canal. This is of uncertain etiology and significance, but is abnormal, and could indicate endometritis with infection from gas-forming organisms, or could be related to recent procedure. Further clinical evaluation is recommended. 2. No definite source for blood loss confidently identified in the abdomen or pelvis. 3. Small volume of ascites. 4. Large left and moderate right pleural effusions with extensive passive atelectasis in the lower lobes of the lungs bilaterally. 5. Aortic atherosclerosis, in addition to left main and 3 vessel coronary artery disease. Assessment for potential risk factor modification, dietary therapy or pharmacologic therapy may be warranted, if clinically indicated. 6. Additional incidental findings, as above.  6/10 RBC tagged study >> Positive tagged red blood cell examination for gastrointestinal bleed, with a very proximal source, either the stomach or duodenum. 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  Micro Data:  6/6 SARS coronavirus 2 >> negative 6/8 MRSA PCR >> neg  Antimicrobials:  n/a  Interim history/subjective:   Currently awake on full mechanical ventilatory support assessing for spontaneous breathing trial.  Objective   Blood pressure 94/82, pulse 73, temperature (!) 97.3 F (36.3 C), temperature source Oral, resp. rate 18, height 4\' 11"  (1.499 m), weight 31.8 kg, SpO2 100 %.  Vent Mode: PSV;CPAP FiO2 (%):  [28 %-40 %] 28 % Pressure Support:  [5 cmH20] 5 cmH20   Intake/Output Summary (Last 24 hours) at 08/05/2018 0826 Last data filed at 08/04/2018 2000 Gross per 24 hour  Intake 89.39 ml  Output 0 ml  Net 89.39 ml   Filed  Weights   08/02/18 0230 08/04/18 0627 08/05/18 0715  Weight: 36.6 kg 30.3 kg 31.8 kg   Examination: General: Frail cachectic female who is extubated awake follows commands orientated to person place and time HEENT: NG tube is in place, no JVD lymphadenopathy is appreciated Neuro: Appears grossly intact.  Follows commands moves all extremities CV: Heart sounds are regular PULM: Diminished in the bases VP:XTGG, non-tender, bsx4 active.  Currently n.p.o. will advance to clear liquid diet and will seek further input from GI service  Extremities: warm/dry, negative edema.  Right bicep AV graft currently cannulated for hemodialysis Skin: no rashes or lesions    Resolved Hospital Problem list    Assessment & Plan:   GIB- presumed from stomach vs duodenum by RBC study with increasing bloody stools 6/10 and hemodynamic instability P:  Case discussed with Dr. Watt Climes on call for GI given IR reommendations and feels we should proceed with endoscopy if daughter consents.  After discussion with daughter, [name] with options to treat symptomatically and monitor or proceeding with Intubation/ Endoscopy, she is not comfortable "doing nothing" would like to proceed with endoscopy despite high risk for complications.  We will place CVL for better access and intubate patient for procedure in attempts to medical stabilize her as best able for procedure.  The patient was intubated from EGD which found small duodenal wounds.  She has been successfully liberated from full mechanical ventilatory support.  Her hemoglobin is being trended currently is 7.2.  We will transfer out of the intensive care unit to progressive care. Pending Hgb, will transfuse for Hgb < 7 PPI   Shock- related to ABLA/ GIB Recent Labs    08/04/18 2235 08/05/18 0715  HGB 8.1* 7.2*    P:  Transfuse per protocol  She is currently off vasopressor support Discontinue arterial line   Acute respiratory insufficiency in the setting of  procedure Left greater than right pleural effusion was mild airspace disease most likely edema undergoing dialysis 08/05/2018 P:  Successful liberated from mechanical ventilatory support on 08/04/2018 Pull negative on hemodialysis 08/04/2020 decrease pleural effusion Currently on 2 L nasal cannula sats 100% Is awake alert no acute distress Plan is to to deintensify her care memory to progressive care unit and tried hospital service   Hypoglycemia  - s/p glucagon CBG (last 3)  Recent Labs    08/05/18 0309 08/05/18 0716 08/05/18 0821  GLUCAP 75 64* 91    P:  Trend glucose Will initiate diet   ESRD  P:  08/05/2018 currently she is on hemodialysis    Hx HTN P:  Continue holding Lasix and hydralazine since her blood pressure remains soft  CAD/ hx CVA P:  Continue to hold home aspirin and Plavix for now with recent GI bleed   Best practice:  Diet: NPO Pain/Anxiety/Delirium protocol (if indicated): prn fentanyl VAP protocol (if indicated): yes DVT prophylaxis: SCDs only  GI prophylaxis: PPI Glucose control: CBG q4, q1 prn still stablizes Mobility: BR Code Status: full  Family Communication: Daughter, [name] updated by phone by Dr. Mariane Masters, [phone].  08/05/2018 patient updated at bedside. Disposition: ICU 08/05/2018 stable off pressors and extubated plan is to transfer to progressive  unit and to [facility] hospitalist service  Labs   CBC: Recent Labs  Lab 07/30/18 1406  08/02/18 0332 08/03/18 0247  08/03/18 2015  08/03/18 2248 08/04/18 0610 08/04/18 1401 08/04/18 2235 08/05/18 0715  WBC 7.3   < > 6.5 8.2  --  10.8*  --   --  15.8*  --   --  12.4*  NEUTROABS 5.9  --   --   --   --   --   --   --   --   --   --   --   HGB 5.7*   < > 8.2* 6.3*   < > 7.1*   < > 6.8* 8.9* 7.8* 8.1* 7.2*  HCT 18.7*   < > 25.1* 19.0*   < > 21.4*   < > 20.0* 25.7* 22.9* 23.8* 21.6*  MCV 97.4   < > 89.6 90.9  --  91.5  --   --  89.9  --   --  93.1  PLT 145*   < > 88* 100*  --  71*  --    --  90*  --   --  80*   < > = values in this interval not displayed.    Basic Metabolic Panel: Recent Labs  Lab 07/30/18 1406 08/01/18 0202 08/03/18 0247 08/03/18 2231 08/03/18 2248 08/04/18 0610 08/05/18 0445  NA 142 141 139 136 136 137 141  K 3.6 4.3 3.5 4.0 4.3 4.1 4.3  CL 102 105 103  --   --  103 108  CO2 26 22 25   --   --  20* 21*  GLUCOSE 126* 95 95  --   --  150* 86  BUN 64* 94* 46*  --   --  70* 78*  CREATININE 5.37* 6.29* 4.00*  --   --  4.93* 5.59*  CALCIUM 7.9* 7.3* 7.7*  --   --  7.7* 7.5*  MG  --   --   --   --   --   --  2.0  PHOS  --   --  4.2  --   --  4.2 4.7*   GFR: Estimated Creatinine Clearance: 4.3 mL/min (A) (by C-G formula based on SCr of 5.59 mg/dL (H)). Recent Labs  Lab 08/03/18 0247 08/03/18 2015 08/04/18 0610 08/05/18 0715  WBC 8.2 10.8* 15.8* 12.4*    Liver Function Tests: Recent Labs  Lab 07/30/18 1406 08/01/18 0202 08/04/18 0610  AST 53* 36  --   ALT 113* 81*  --   ALKPHOS 64 62  --   BILITOT 0.6 0.9  --   PROT 5.2* 4.8*  --   ALBUMIN 2.5* 2.4* 2.4*   No results for input(s): LIPASE, AMYLASE in the last 168 hours. No results for input(s): AMMONIA in the last 168 hours.  ABG    Component Value Date/Time   PHART 7.396 08/04/2018 1345   PCO2ART 33.6 08/04/2018 1345   PO2ART 182 (H) 08/04/2018 1345   HCO3 20.2 08/04/2018 1345   TCO2 23 08/03/2018 2248   ACIDBASEDEF 3.8 (H) 08/04/2018 1345   O2SAT 99.4 08/04/2018 1345     Coagulation Profile: Recent Labs  Lab 08/03/18 2015  INR 1.3*    Cardiac Enzymes: No results for input(s): CKTOTAL, CKMB, CKMBINDEX, TROPONINI in the last 168 hours.  HbA1C: Hgb A1c MFr Bld  Date/Time Value Ref Range Status  07/14/2017 04:32 AM 4.3 (L) 4.8 - 5.6 % Final    Comment:    (  NOTE) Pre diabetes:          5.7%-6.4% Diabetes:              >6.4% Glycemic control for   <7.0% adults with diabetes   07/17/2012 03:50 PM 5.9 (H) <5.7 % Final    Comment:    (NOTE)                                                                        According to the ADA Clinical Practice Recommendations for 2011, when HbA1c is used as a screening test:  >=6.5%   Diagnostic of Diabetes Mellitus           (if abnormal result is confirmed) 5.7-6.4%   Increased risk of developing Diabetes Mellitus References:Diagnosis and Classification of Diabetes Mellitus,Diabetes IBBC,4888,91(QXIHW 1):S62-S69 and Standards of Medical Care in         Diabetes - 2011,Diabetes TUUE,2800,34 (Suppl 1):S11-S61.    CBG: Recent Labs  Lab 08/04/18 2025 08/04/18 2257 08/05/18 0309 08/05/18 0716 08/05/18 0821  GLUCAP 104* 90 75 64* 91     App Critical care time: 30 mins    Richardson Landry Eirik Schueler ACNP Courtney [name] PCCM Pager [phone] till 1 pm If no answer page [phone] 08/05/2018, 8:26 AM

## 2018-08-05 NOTE — Progress Notes (Signed)
[facility] Progress Note    Assessment/ Plan:   78 year old BF with multiple medical problems including ESRD. Presents with weakness found to have hgb of 5.7 1hgb of 5.7- on 5/27 was 8.5 when she came for access procedure. Son denies overt bleeding. Do not have recent information from the kidney center about iron or ESA dosing.s/p2 units on 6/6.Dosedwith ESA. GI initially did not to consider invasive eval bec of high mortality risk w/ the procedure but bec of recurrent bldg performed a EGD with enteroscopy 6/10 -> ooze from AVM in 2nd portion of the duodenum. They are not convinced that accounts for all the blood loss. Fortunately no further bleeding via stool but HB still dropping.  2 ESRD:normally TTS at [facility] via AVG. Last HD was Monday.   Seen on HD 3K/2.25 UF 1.7 (net 1L is goal) RUA AVG - needles in place  Will plan next HD for Mon; she has been through a lot and I don't want to stress her again tomorrow w/ a HD treatment.  3 Hypertension:BP is low- looks like at home is on hydralazine and lasix- being held.   5. Metabolic Bone Disease - Phos is 4.7; hold off on binder for now. Will recheck Mon.  Subjective:   Bleeding again evening of 6/10 -> EGD with enteroscopy 6//10 finding only AVM in 2nd portion of duodenum w/ slow ooze.  No further bleeding overnight.  No longer on pressors.   Objective:   BP (!) 98/39   Pulse 71   Temp (!) 97.3 F (36.3 C) (Oral)   Resp 19   Ht 4' 11"  (1.499 m)   Wt 31.8 kg   SpO2 100%   BMI 14.16 kg/m   Intake/Output Summary (Last 24 hours) at 08/05/2018 0859 Last data filed at 08/04/2018 2000 Gross per 24 hour  Intake 89.39 ml  Output 0 ml  Net 89.39 ml   Weight change:   Physical Exam: General:Extubated, comfortable appearing.  Heart:RRR Lungs:mostly clear Abdomen:nontender Extremities:really no edema Dialysis Access:RUA AVGgood bruit - needles in place  Imaging: Dg Abd 1  View  Result Date: 08/04/2018 CLINICAL DATA:  Check NG placement EXAM: ABDOMEN - 1 VIEW COMPARISON:  None. FINDINGS: Gastric catheter is noted within the stomach. Stomach remains distended with air. Large left-sided pleural effusion is seen. No free air is noted. No acute bony abnormality is noted. IMPRESSION: Gastric catheter within the stomach. Electronically Signed   By: Inez Catalina M.D.   On: 08/04/2018 08:12   Nm Gi Blood Loss  Result Date: 08/03/2018 CLINICAL DATA:  Bloody stools, anemia, hypotensive EXAM: NUCLEAR MEDICINE GASTROINTESTINAL BLEEDING SCAN TECHNIQUE: Sequential abdominal images were obtained following intravenous administration of Tc-63mlabeled red blood cells. RADIOPHARMACEUTICALS:  23.5 mCi Tc-92mertechnetate in-vitro labeled red cells. COMPARISON:  CT abdomen pelvis, 07/30/2018 FINDINGS: There is abnormal radiotracer localization, which when comparing to anatomic configuration on prior CT appears to be within the gastric lumen or perhaps the proximal duodenum, and is noted to peristalsis throughout the small bowel and proximal colon for the duration of the study. IMPRESSION: Positive tagged red blood cell examination for gastrointestinal bleed, with a very proximal source, either the stomach or duodenum. Electronically Signed   By: AlEddie Candle.D.   On: 08/03/2018 14:58   Dg Chest Port 1 View  Result Date: 08/05/2018 CLINICAL DATA:  Respiratory failure. EXAM: PORTABLE CHEST 1 VIEW COMPARISON:  One-view chest x-ray 08/03/2018 FINDINGS: The patient has been extubated. The side port  of the NG tube is just beyond the GE junction. Left subclavian line is stable. The heart is enlarged. Left pleural effusion and airspace disease has increased. There is progressive right perihilar and lower lobe airspace disease. A small right pleural effusion is now present. IMPRESSION: 1. Progressive left pleural effusion and airspace disease. 2. Increasing right pleural effusion and perihilar  opacities. 3. Probable congestive heart failure. Electronically Signed   By: San Morelle M.D.   On: 08/05/2018 07:59   Dg Chest Port 1 View  Result Date: 08/03/2018 CLINICAL DATA:  78 year old female intubated. EXAM: PORTABLE CHEST 1 VIEW COMPARISON:  07/30/2018 and earlier. FINDINGS: Portable AP semi upright view at 2107 hours. Endotracheal tube tip located about 18 millimeters above the carina. Left subclavian central line has been placed, tip at the level of the carina. No enteric tube. No pneumothorax. Continued dense opacification of the left lower lung most resembling a moderate size pleural effusion. No pulmonary edema. Stable visible mediastinal contours. Nonspecific visible bowel gas pattern. Right axillary vascular stent again noted. IMPRESSION: 1. Endotracheal tube tip about 18 mm above the carina. 2. Left subclavian central line placed, tip at the SVC level. 3. Continued moderate left pleural effusion. Electronically Signed   By: Genevie Ann M.D.   On: 08/03/2018 21:42    Labs: BMET Recent Labs  Lab 07/30/18 1406 08/01/18 0202 08/03/18 0247 08/03/18 2231 08/03/18 2248 08/04/18 0610 08/05/18 0445  NA 142 141 139 136 136 137 141  K 3.6 4.3 3.5 4.0 4.3 4.1 4.3  CL 102 105 103  --   --  103 108  CO2 26 22 25   --   --  20* 21*  GLUCOSE 126* 95 95  --   --  150* 86  BUN 64* 94* 46*  --   --  70* 78*  CREATININE 5.37* 6.29* 4.00*  --   --  4.93* 5.59*  CALCIUM 7.9* 7.3* 7.7*  --   --  7.7* 7.5*  PHOS  --   --  4.2  --   --  4.2 4.7*   CBC Recent Labs  Lab 07/30/18 1406  08/03/18 0247  08/03/18 2015  08/04/18 0610 08/04/18 1401 08/04/18 2235 08/05/18 0715  WBC 7.3   < > 8.2  --  10.8*  --  15.8*  --   --  12.4*  NEUTROABS 5.9  --   --   --   --   --   --   --   --   --   HGB 5.7*   < > 6.3*   < > 7.1*   < > 8.9* 7.8* 8.1* 7.2*  HCT 18.7*   < > 19.0*   < > 21.4*   < > 25.7* 22.9* 23.8* 21.6*  MCV 97.4   < > 90.9  --  91.5  --  89.9  --   --  93.1  PLT 145*   < >  100*  --  71*  --  90*  --   --  80*   < > = values in this interval not displayed.    Medications:    . sodium chloride   Intravenous Once  . sodium chloride   Intravenous Once  . chlorhexidine gluconate (MEDLINE KIT)  15 mL Mouth Rinse BID  . Chlorhexidine Gluconate Cloth  6 each Topical Q0600  . Darbepoetin Alfa      . darbepoetin (ARANESP) injection - DIALYSIS  150 mcg Intravenous Once  .  escitalopram  5 mg Oral Daily  . mouth rinse  15 mL Mouth Rinse BID  . midazolam  2 mg Intravenous Once  . multivitamin  1 tablet Oral QHS  . Pancrealipase (Lip-Prot-Amyl) CPEP 6,000 units  1 capsule Oral TID WC  . pantoprazole (PROTONIX) IV  40 mg Intravenous Q24H      Otelia Santee, MD 08/05/2018, 8:59 AM

## 2018-08-06 ENCOUNTER — Inpatient Hospital Stay (HOSPITAL_COMMUNITY): Payer: Medicare Other

## 2018-08-06 DIAGNOSIS — R0902 Hypoxemia: Secondary | ICD-10-CM

## 2018-08-06 DIAGNOSIS — J939 Pneumothorax, unspecified: Secondary | ICD-10-CM

## 2018-08-06 DIAGNOSIS — J441 Chronic obstructive pulmonary disease with (acute) exacerbation: Secondary | ICD-10-CM

## 2018-08-06 LAB — BASIC METABOLIC PANEL
Anion gap: 12 (ref 5–15)
BUN: 36 mg/dL — ABNORMAL HIGH (ref 8–23)
CO2: 22 mmol/L (ref 22–32)
Calcium: 7.5 mg/dL — ABNORMAL LOW (ref 8.9–10.3)
Chloride: 102 mmol/L (ref 98–111)
Creatinine, Ser: 3.13 mg/dL — ABNORMAL HIGH (ref 0.44–1.00)
GFR calc Af Amer: 16 mL/min — ABNORMAL LOW (ref >60)
GFR calc non Af Amer: 14 mL/min — ABNORMAL LOW (ref >60)
Glucose, Bld: 94 mg/dL (ref 70–99)
Potassium: 4.3 mmol/L (ref 3.5–5.1)
Sodium: 136 mmol/L (ref 135–145)

## 2018-08-06 LAB — CBC
HCT: 20.3 % — ABNORMAL LOW (ref 36.0–46.0)
Hemoglobin: 6.7 g/dL — CL (ref 12.0–15.0)
MCH: 31.9 pg (ref 26.0–34.0)
MCHC: 33 g/dL (ref 30.0–36.0)
MCV: 96.7 fL (ref 80.0–100.0)
Platelets: 86 10*3/uL — ABNORMAL LOW (ref 150–400)
RBC: 2.1 MIL/uL — ABNORMAL LOW (ref 3.87–5.11)
RDW: 17.2 % — ABNORMAL HIGH (ref 11.5–15.5)
WBC: 9.7 10*3/uL (ref 4.0–10.5)
nRBC: 0 % (ref 0.0–0.2)

## 2018-08-06 LAB — GLUCOSE, CAPILLARY
Glucose-Capillary: 86 mg/dL (ref 70–99)
Glucose-Capillary: 85 mg/dL (ref 70–99)
Glucose-Capillary: 77 mg/dL (ref 70–99)
Glucose-Capillary: 81 mg/dL (ref 70–99)
Glucose-Capillary: 64 mg/dL — ABNORMAL LOW (ref 70–99)
Glucose-Capillary: 85 mg/dL (ref 70–99)
Glucose-Capillary: 62 mg/dL — ABNORMAL LOW (ref 70–99)

## 2018-08-06 LAB — MAGNESIUM: Magnesium: 1.7 mg/dL (ref 1.7–2.4)

## 2018-08-06 LAB — HEMOGLOBIN AND HEMATOCRIT, BLOOD
HCT: 33.4 % — ABNORMAL LOW (ref 36.0–46.0)
Hemoglobin: 11.7 g/dL — ABNORMAL LOW (ref 12.0–15.0)

## 2018-08-06 LAB — PREPARE RBC (CROSSMATCH)

## 2018-08-06 LAB — TRIGLYCERIDES, BODY FLUIDS: Triglycerides, Fluid: 18 mg/dL

## 2018-08-06 LAB — PHOSPHORUS: Phosphorus: 2.9 mg/dL (ref 2.5–4.6)

## 2018-08-06 MED ORDER — DEXTROSE 50 % IV SOLN
INTRAVENOUS | Status: AC
  Administered 2018-08-06: 25 mL via INTRAVENOUS
  Filled 2018-08-06: qty 50

## 2018-08-06 MED ORDER — SODIUM CHLORIDE 0.9 % IV SOLN
8.0000 mg/h | INTRAVENOUS | Status: DC
  Administered 2018-08-06 – 2018-08-07 (×2): 8 mg/h via INTRAVENOUS
  Filled 2018-08-06 (×4): qty 80

## 2018-08-06 MED ORDER — DEXTROSE 50 % IV SOLN
25.0000 mL | INTRAVENOUS | Status: DC | PRN
  Administered 2018-08-06 (×2): 25 mL via INTRAVENOUS

## 2018-08-06 MED ORDER — TECHNETIUM TC 99M-LABELED RED BLOOD CELLS IV KIT
25.0000 | PACK | Freq: Once | INTRAVENOUS | Status: AC | PRN
  Administered 2018-08-06: 14:00:00 25 via INTRAVENOUS

## 2018-08-06 MED ORDER — SODIUM CHLORIDE 0.9% IV SOLUTION
Freq: Once | INTRAVENOUS | Status: AC
  Administered 2018-08-06: 13:00:00 via INTRAVENOUS

## 2018-08-06 MED ORDER — PEG 3350-KCL-NA BICARB-NACL 420 G PO SOLR
4000.0000 mL | Freq: Once | ORAL | Status: AC
  Administered 2018-08-06: 18:00:00 4000 mL via ORAL
  Filled 2018-08-06: qty 4000

## 2018-08-06 MED ORDER — PANTOPRAZOLE SODIUM 40 MG IV SOLR: 40.0000 mg | Freq: Two times a day (BID) | INTRAVENOUS | Status: DC

## 2018-08-06 MED ORDER — PEG 3350-KCL-NA BICARB-NACL 420 G PO SOLR: 4000.0000 mL | Freq: Once | ORAL | Status: DC

## 2018-08-06 NOTE — Progress Notes (Addendum)
Received a call from patient's nurse that she had 2 episodes of rectal bleeding, described as bright red in color, 300 cc in amount. Hemoglobin is 6.7, was 7.7 yesterday, 2 units PRBC transfusion has been ordered.  Duodenal AVM noted on EGD from 08/03/2018 treated with cauterization after bleeding scan from 08/03/2018 showed possible stomach or proximal duodenum as source of active bleeding.  Recommend: STAT bleeding scan, IR evaluation if active bleeding noted for possible embolization.  I have asked the patient's nurse Demetria to notify me immediately when result of bleeding scan is available. Meanwhile will prep patient for colonoscopy in a.m.,if colonoscopy is unremarkable plan to proceed with an EGD and small bowel PillCam placement. Switch to IV Protonix drip at 8 mg/hr.  Ronnette Juniper, MD

## 2018-08-06 NOTE — Progress Notes (Signed)
[facility] Progress Note    Assessment/ Plan:   78 year old BF with multiple medical problems including ESRD. Presents with weakness found to have hgb of 5.7 1hgb of 5.7- on 5/27 was 8.5 when she came for access procedure. Son denies overt bleeding. Do not have recent information from the kidney center about iron or ESA dosing.s/p2 units on 6/6.Dosedwith ESA. GIinitially didnot to consider invasive evalbec of high mortality risk w/ the procedure but bec of recurrent bldg performed a EGD with enteroscopy 6/10 -> ooze from AVM in 2nd portion of the duodenum. They are not convinced that accounts for all the blood loss.Fortunately no further bleeding via stool but HB still dropping.  2 ESRD:normally TTS at [facility] via AVG. Last HD was Monday.   Last HD Fri.  Plan next HD for Mon; she has been through a lot and I don't want to stress her again. BP also soft.  3 Hypertension:BP is low- looks like at home is on hydralazine and lasix- being held.   5. Metabolic Bone Disease - Phosis 4.7; hold off on binder for now. Will recheck Mon.  Subjective:   Bleeding again evening of 6/10 -> EGD with enteroscopy 6//10 finding only AVM in 2nd portion of duodenum w/ slow ooze.  No further bleeding overnight; denies dyspnea/ cp /n.  No longer on pressors.  Dark BM but no blood.   Objective:   BP (!) 93/44   Pulse 70   Temp (!) 97.5 F (36.4 C) (Oral)   Resp 17   Ht 4\' 11"  (1.499 m)   Wt 31.1 kg   SpO2 100%   BMI 13.85 kg/m   Intake/Output Summary (Last 24 hours) at 08/06/2018 0906 Last data filed at 08/06/2018 0600 Gross per 24 hour  Intake -  Output 964 ml  Net -964 ml   Weight change:   Physical Exam: General:Extubated, comfortable appearing. Heart:RRR Lungs:mostly clear Abdomen:nontender Extremities:really no edema Dialysis Access:RUA AVGgood bruit  Imaging: Dg Chest Port 1 View  Result Date: 08/06/2018 CLINICAL DATA:   Respiratory abnormalities EXAM: PORTABLE CHEST 1 VIEW COMPARISON:  August 05, 2018 FINDINGS: A left chest tube terminates over the left apex. The previously identified pneumothorax has resolved. Effusion and opacity in left base are mildly worsened. Probable small layering effusion on the right, similar in the interval. The cardiomediastinal silhouette is stable. No other abnormalities. IMPRESSION: 1. Left chest tube.  No pneumothorax. 2. Left effusion with underlying opacity, mildly worsened. Small right effusion is similar in the interval. Electronically Signed   By: Dorise Bullion III M.D   On: 08/06/2018 08:15   Dg Chest Port 1 View  Result Date: 08/05/2018 CLINICAL DATA:  Chest tube placement EXAM: PORTABLE CHEST 1 VIEW COMPARISON:  August 05, 2018 FINDINGS: A left-sided chest tube is been placed in the interval. The left pneumothorax is much smaller in the interval with only a small a focal component measuring 14 mm identified. Effusion and opacity is seen in the left base. Small right effusion. No other changes. IMPRESSION: 1. A left chest tube terminates near the left apex. The left-sided pneumothorax is much smaller measuring 14 mm at the apex. 2. Bilateral pleural effusions with underlying opacities. Electronically Signed   By: Dorise Bullion III M.D   On: 08/05/2018 15:45   Dg Chest Port 1 View  Result Date: 08/05/2018 CLINICAL DATA:  Status post left thoracentesis. EXAM: PORTABLE CHEST 1 VIEW COMPARISON:  08/05/2018 at 4:33 a.m. FINDINGS: There has been  a significant decrease in pleural fluid on the left following thoracentesis. There is now a moderate-sized pneumothorax, likely ex vacuo, 50-60% in size. Minimal residual pleural fluid blunts the lateral left costophrenic sulcus. Stable small right pleural effusion. Stable bilateral vascular congestion. IMPRESSION: 1. Status post left thoracentesis with significant reduction in left pleural fluid, but now with a moderate size pneumothorax, 50-60%,  likely ex vacuo. Electronically Signed   By: Lajean Manes M.D.   On: 08/05/2018 14:25   Dg Chest Port 1 View  Result Date: 08/05/2018 CLINICAL DATA:  Respiratory failure. EXAM: PORTABLE CHEST 1 VIEW COMPARISON:  One-view chest x-ray 08/03/2018 FINDINGS: The patient has been extubated. The side port of the NG tube is just beyond the GE junction. Left subclavian line is stable. The heart is enlarged. Left pleural effusion and airspace disease has increased. There is progressive right perihilar and lower lobe airspace disease. A small right pleural effusion is now present. IMPRESSION: 1. Progressive left pleural effusion and airspace disease. 2. Increasing right pleural effusion and perihilar opacities. 3. Probable congestive heart failure. Electronically Signed   By: San Morelle M.D.   On: 08/05/2018 07:59    Labs: BMET Recent Labs  Lab 07/30/18 1406 08/01/18 0202 08/03/18 0247 08/03/18 2231 08/03/18 2248 08/04/18 0610 08/05/18 0445  NA 142 141 139 136 136 137 141  K 3.6 4.3 3.5 4.0 4.3 4.1 4.3  CL 102 105 103  --   --  103 108  CO2 26 22 25   --   --  20* 21*  GLUCOSE 126* 95 95  --   --  150* 86  BUN 64* 94* 46*  --   --  70* 78*  CREATININE 5.37* 6.29* 4.00*  --   --  4.93* 5.59*  CALCIUM 7.9* 7.3* 7.7*  --   --  7.7* 7.5*  PHOS  --   --  4.2  --   --  4.2 4.7*   CBC Recent Labs  Lab 07/30/18 1406  08/03/18 0247  08/03/18 2015  08/04/18 0610  08/04/18 2235 08/05/18 0715 08/05/18 1323 08/05/18 2330  WBC 7.3   < > 8.2  --  10.8*  --  15.8*  --   --  12.4*  --   --   NEUTROABS 5.9  --   --   --   --   --   --   --   --   --   --   --   HGB 5.7*   < > 6.3*   < > 7.1*   < > 8.9*   < > 8.1* 7.2* 7.2* 7.7*  HCT 18.7*   < > 19.0*   < > 21.4*   < > 25.7*   < > 23.8* 21.6* 21.0* 23.0*  MCV 97.4   < > 90.9  --  91.5  --  89.9  --   --  93.1  --   --   PLT 145*   < > 100*  --  71*  --  90*  --   --  80*  --   --    < > = values in this interval not displayed.     Medications:    . sodium chloride   Intravenous Once  . sodium chloride   Intravenous Once  . Chlorhexidine Gluconate Cloth  6 each Topical Q0600  . escitalopram  5 mg Oral Daily  . multivitamin  1 tablet Oral QHS  . Pancrealipase (Lip-Prot-Amyl)  CPEP 6,000 units  1 capsule Oral TID WC  . pantoprazole (PROTONIX) IV  40 mg Intravenous Q24H      Otelia Santee, MD 08/06/2018, 9:06 AM

## 2018-08-06 NOTE — Progress Notes (Signed)
NAME:  Courtney [name], MRN:  [id number], DOB:  April 29, 1940, LOS: 7 ADMISSION DATE:  07/30/2018, CONSULTATION DATE:  08/03/2018 REFERRING MD:  TRH, CHIEF COMPLAINT:  Hypotension/ GIB  Brief History   78 year old female admitted 6/6 with hypotension and Hgb of 5.7.  Over the course, she started having rectal bleeding requiring transfusions.  On 6/10, had ongoing bleeding becoming hypotensive requiring vasopressors, GI and IR evaluating.    History of present illness   HPI mostly obtained from medical chart review as patient is poor historian.   78 year old female with history of ESRD - TTS HD, diastolic HF, stroke- on ASA and plavix, CAD, DM, HTN, and HLD admitted 6/6 with hypotension and Hgb of 5.7.  ASA and plavix held.  She was transfused 2 units of PRBC and admitted for symptomatic anemia.  GI and nephrology consulting.  She was hemoccult positive.  CT abd/pelvis showed no source of bleeding or hematoma, but small amount of gas in the endometrial canal of uncertain etiology with bilateral pleural effusions.  GYN was consulted and recommended no further workup.  Endoscopy was held off given her frailty and high risk.  She started having bloody stool overnight 6/7 and again transfused 1 unit PRBC 6/8. Bowel movements turned blackish/ brown with Hgb stable until she progressively became hypotensive 6/10 with ongoing bloody stools.  She was given given IVF, 1 unit PRBC and transferred to ICU for close monitoring.  Underwent RBC tagged study which was read as positive for GIB with proximal source either stomach or duodenum.  IR was consulted for possible arteriogram and embolization procedure however he thought there was very minimal bleeding to confidently identify bleeding source and recommended GI re-evaluate for either endoscopy or CTA abd/ pelvis.  While in ICU she loss her PIV access while on levophed and PCCM consulted to assist with further medical management and stabilization.     Past Medical History   ESRD - TTS HD, diastolic HF, stroke, CAD, DM, HTN, HLD  Significant Hospital Events   6/6 Admitted  6/10 tx to ICU  Consults:  GI Nephrology IR   Procedures:  08/03/2018 intubation>>8-11 08/03/2018 EGD Significant Diagnostic Tests:  6/6 CT abd/ pelvis >> 1. Small amount of gas in the endometrial canal. This is of uncertain etiology and significance, but is abnormal, and could indicate endometritis with infection from gas-forming organisms, or could be related to recent procedure. Further clinical evaluation is recommended. 2. No definite source for blood loss confidently identified in the abdomen or pelvis. 3. Small volume of ascites. 4. Large left and moderate right pleural effusions with extensive passive atelectasis in the lower lobes of the lungs bilaterally. 5. Aortic atherosclerosis, in addition to left main and 3 vessel coronary artery disease. Assessment for potential risk factor modification, dietary therapy or pharmacologic therapy may be warranted, if clinically indicated. 6. Additional incidental findings, as above.  6/10 RBC tagged study >> Positive tagged red blood cell examination for gastrointestinal bleed, with a very proximal source, either the stomach or duodenum. 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  Micro Data:  6/6 SARS coronavirus 2 >> negative 6/8 MRSA PCR >> neg  Antimicrobials:  n/a  Interim history/subjective:   CT placed yesterday for iatrogenic PTX post thora  No events overnight  No new complaints  Objective   Blood pressure (!) 93/44, pulse 70, temperature (!) 97.5 F (36.4 C), temperature source Oral, resp. rate 17, height 4\' 11"  (1.499 m), weight 31.1  kg, SpO2 100 %.        Intake/Output Summary (Last 24 hours) at 08/06/2018 0941 Last data filed at 08/06/2018 0600 Gross per 24 hour  Intake -  Output 964 ml  Net -964 ml   Filed Weights   08/04/18 0627 08/05/18 0715 08/05/18 1050  Weight: 30.3 kg 31.8 kg 31.1 kg    Examination: General: Frail elderly female, NAD HEENT: [place]/AT, PERRL, EOM-I and MMM Neuro: Alert and interactive, moving all ext to command CV: RRR, Nl S1/S2 and -M/R/G PULM: Decreased BS diffusely, CT in place GI: Soft, NT, ND and +BS Extremities: -edema and -tenderness Skin: no rashes or lesions  I reviewed CXR myself, no PTX noted with CT in place  Resolved Hospital Problem list    Assessment & Plan:  PTX:  - CT to waterseal  - CXR at 1300 hour  - Plan on clamping in AM if no PTX and if stable remove  Hypoxemia:  - Titrate O2 for sat of 88-92%  - May need an ambulatory desaturation study for home O2 prior to discharge  COPD:  - BD as ordered  Discussed with PCCM-NP.  Labs   CBC: Recent Labs  Lab 07/30/18 1406  08/02/18 0332 08/03/18 0247  08/03/18 2015  08/04/18 0610 08/04/18 1401 08/04/18 2235 08/05/18 0715 08/05/18 1323 08/05/18 2330  WBC 7.3   < > 6.5 8.2  --  10.8*  --  15.8*  --   --  12.4*  --   --   NEUTROABS 5.9  --   --   --   --   --   --   --   --   --   --   --   --   HGB 5.7*   < > 8.2* 6.3*   < > 7.1*   < > 8.9* 7.8* 8.1* 7.2* 7.2* 7.7*  HCT 18.7*   < > 25.1* 19.0*   < > 21.4*   < > 25.7* 22.9* 23.8* 21.6* 21.0* 23.0*  MCV 97.4   < > 89.6 90.9  --  91.5  --  89.9  --   --  93.1  --   --   PLT 145*   < > 88* 100*  --  71*  --  90*  --   --  80*  --   --    < > = values in this interval not displayed.    Basic Metabolic Panel: Recent Labs  Lab 07/30/18 1406 08/01/18 0202 08/03/18 0247 08/03/18 2231 08/03/18 2248 08/04/18 0610 08/05/18 0445  NA 142 141 139 136 136 137 141  K 3.6 4.3 3.5 4.0 4.3 4.1 4.3  CL 102 105 103  --   --  103 108  CO2 26 22 25   --   --  20* 21*  GLUCOSE 126* 95 95  --   --  150* 86  BUN 64* 94* 46*  --   --  70* 78*  CREATININE 5.37* 6.29* 4.00*  --   --  4.93* 5.59*  CALCIUM 7.9* 7.3* 7.7*  --   --  7.7* 7.5*  MG  --   --   --   --   --   --  2.0  PHOS  --   --  4.2  --   --  4.2 4.7*   GFR: Estimated  Creatinine Clearance: 4.2 mL/min (A) (by C-G formula based on SCr of 5.59 mg/dL (H)). Recent Labs  Lab 08/03/18  0247 08/03/18 2015 08/04/18 0610 08/05/18 0715  WBC 8.2 10.8* 15.8* 12.4*    Liver Function Tests: Recent Labs  Lab 07/30/18 1406 08/01/18 0202 08/04/18 0610 08/05/18 1550  AST 53* 36  --   --   ALT 113* 81*  --   --   ALKPHOS 64 62  --   --   BILITOT 0.6 0.9  --   --   PROT 5.2* 4.8*  --  4.7*  ALBUMIN 2.5* 2.4* 2.4*  --    No results for input(s): LIPASE, AMYLASE in the last 168 hours. No results for input(s): AMMONIA in the last 168 hours.  ABG    Component Value Date/Time   PHART 7.396 08/04/2018 1345   PCO2ART 33.6 08/04/2018 1345   PO2ART 182 (H) 08/04/2018 1345   HCO3 20.2 08/04/2018 1345   TCO2 23 08/03/2018 2248   ACIDBASEDEF 3.8 (H) 08/04/2018 1345   O2SAT 99.4 08/04/2018 1345     Coagulation Profile: Recent Labs  Lab 08/03/18 2015  INR 1.3*    Cardiac Enzymes: No results for input(s): CKTOTAL, CKMB, CKMBINDEX, TROPONINI in the last 168 hours.  HbA1C: Hgb A1c MFr Bld  Date/Time Value Ref Range Status  07/14/2017 04:32 AM 4.3 (L) 4.8 - 5.6 % Final    Comment:    (NOTE) Pre diabetes:          5.7%-6.4% Diabetes:              >6.4% Glycemic control for   <7.0% adults with diabetes   07/17/2012 03:50 PM 5.9 (H) <5.7 % Final    Comment:    (NOTE)                                                                       According to the ADA Clinical Practice Recommendations for 2011, when HbA1c is used as a screening test:  >=6.5%   Diagnostic of Diabetes Mellitus           (if abnormal result is confirmed) 5.7-6.4%   Increased risk of developing Diabetes Mellitus References:Diagnosis and Classification of Diabetes Mellitus,Diabetes FKCL,2751,70(YFVCB 1):S62-S69 and Standards of Medical Care in         Diabetes - 2011,Diabetes SWHQ,7591,63 (Suppl 1):S11-S61.    CBG: Recent Labs  Lab 08/05/18 1910 08/05/18 1952 08/05/18 2309  08/06/18 0304 08/06/18 0704  GLUCAP 60* 114* 100* 86 85   Rush Farmer, M.D. [facility] Pulmonary/Critical Care Medicine. Pager: [phone]. After hours pager: [phone].

## 2018-08-06 NOTE — Progress Notes (Signed)
Subjective: Complains of feeling weak As per her nurse, she had 1 episode of dark stool in the last 24 hours ?old blood  Objective: Vital signs in last 24 hours: Temp:  [96.7 F (35.9 C)-98.1 F (36.7 C)] 97.5 F (36.4 C) (06/13 0706) Pulse Rate:  [64-76] 70 (06/13 0500) Resp:  [15-23] 17 (06/13 0500) BP: (71-112)/(15-64) 93/44 (06/13 0500) SpO2:  [58 %-100 %] 100 % (06/13 0500) Arterial Line BP: (92-140)/(28-64) 108/37 (06/12 1700) Weight:  [31.1 kg] 31.1 kg (06/12 1050) Weight change:  Last BM Date: 08/05/18  KW:IOXBDZHGD pallor, oriented to place not to person or time GENERAL:cachectic, chest tube in left chest ABDOMEN:soft, non-distended,nontender  EXTREMITIES:no edema, fistual in right arm   Lab Results: Results for orders placed or performed during the hospital encounter of 07/30/18 (from the past 48 hour(s))  Glucose, capillary     Status: Abnormal   Collection Time: 08/04/18 11:50 AM  Result Value Ref Range   Glucose-Capillary 112 (H) 70 - 99 mg/dL  Blood gas, arterial     Status: Abnormal   Collection Time: 08/04/18  1:45 PM  Result Value Ref Range   FIO2 40.00    Delivery systems VENTILATOR    Mode PRESSURE SUPPORT    Peep/cpap 5.0 cm H20   Pressure support 5.0 cm H20   pH, Arterial 7.396 7.350 - 7.450   pCO2 arterial 33.6 32.0 - 48.0 mmHg   pO2, Arterial 182 (H) 83.0 - 108.0 mmHg   Bicarbonate 20.2 20.0 - 28.0 mmol/L   Acid-base deficit 3.8 (H) 0.0 - 2.0 mmol/L   O2 Saturation 99.4 %   Patient temperature 98.6    Collection site A-LINE    Drawn by [id number]    Sample type ARTERIAL DRAW   Hemoglobin and hematocrit, blood     Status: Abnormal   Collection Time: 08/04/18  2:01 PM  Result Value Ref Range   Hemoglobin 7.8 (L) 12.0 - 15.0 g/dL   HCT 22.9 (L) 36.0 - 46.0 %    Comment: Performed at [facility], [address], [place], [place] [address]  Glucose, capillary     Status: None   Collection Time: 08/04/18  3:12 PM  Result Value Ref Range    Glucose-Capillary 96 70 - 99 mg/dL  Glucose, capillary     Status: Abnormal   Collection Time: 08/04/18  7:24 PM  Result Value Ref Range   Glucose-Capillary 61 (L) 70 - 99 mg/dL  Glucose, capillary     Status: Abnormal   Collection Time: 08/04/18  8:25 PM  Result Value Ref Range   Glucose-Capillary 104 (H) 70 - 99 mg/dL  Hemoglobin and hematocrit, blood     Status: Abnormal   Collection Time: 08/04/18 10:35 PM  Result Value Ref Range   Hemoglobin 8.1 (L) 12.0 - 15.0 g/dL   HCT 23.8 (L) 36.0 - 46.0 %    Comment: Performed at [facility], [place] [address] [place], [place] [address]  Glucose, capillary     Status: None   Collection Time: 08/04/18 10:57 PM  Result Value Ref Range   Glucose-Capillary 90 70 - 99 mg/dL  Glucose, capillary     Status: None   Collection Time: 08/05/18  3:09 AM  Result Value Ref Range   Glucose-Capillary 75 70 - 99 mg/dL  Basic metabolic panel     Status: Abnormal   Collection Time: 08/05/18  4:45 AM  Result Value Ref Range   Sodium 141 135 - 145  mmol/L   Potassium 4.3 3.5 - 5.1 mmol/L   Chloride 108 98 - 111 mmol/L   CO2 21 (L) 22 - 32 mmol/L   Glucose, Bld 86 70 - 99 mg/dL   BUN 78 (H) 8 - 23 mg/dL   Creatinine, Ser 5.59 (H) 0.44 - 1.00 mg/dL   Calcium 7.5 (L) 8.9 - 10.3 mg/dL   GFR calc non Af Amer 7 (L) >60 mL/min   GFR calc Af Amer 8 (L) >60 mL/min   Anion gap 12 5 - 15    Comment: Performed at [facility] [address], [place], [place] [address]  Magnesium     Status: None   Collection Time: 08/05/18  4:45 AM  Result Value Ref Range   Magnesium 2.0 1.7 - 2.4 mg/dL    Comment: Performed at [facility] [address], [place], [place] [address]  Phosphorus     Status: Abnormal   Collection Time: 08/05/18  4:45 AM  Result Value Ref Range   Phosphorus 4.7 (H) 2.5 - 4.6 mg/dL    Comment: Performed at [facility] [address] [place], [place] [address]  CBC     Status: Abnormal   Collection Time: 08/05/18   7:15 AM  Result Value Ref Range   WBC 12.4 (H) 4.0 - 10.5 K/uL   RBC 2.32 (L) 3.87 - 5.11 MIL/uL   Hemoglobin 7.2 (L) 12.0 - 15.0 g/dL   HCT 21.6 (L) 36.0 - 46.0 %   MCV 93.1 80.0 - 100.0 fL   MCH 31.0 26.0 - 34.0 pg   MCHC 33.3 30.0 - 36.0 g/dL   RDW 16.4 (H) 11.5 - 15.5 %   Platelets 80 (L) 150 - 400 K/uL    Comment: REPEATED TO VERIFY SPECIMEN CHECKED FOR CLOTS Immature Platelet Fraction may be clinically indicated, consider ordering this additional test [id number] CONSISTENT WITH PREVIOUS RESULT    nRBC 0.0 0.0 - 0.2 %    Comment: Performed at [facility], [place] [address], [place], [place] [address]  Glucose, capillary     Status: Abnormal   Collection Time: 08/05/18  7:16 AM  Result Value Ref Range   Glucose-Capillary 64 (L) 70 - 99 mg/dL  Glucose, capillary     Status: None   Collection Time: 08/05/18  8:21 AM  Result Value Ref Range   Glucose-Capillary 91 70 - 99 mg/dL  Glucose, capillary     Status: Abnormal   Collection Time: 08/05/18 11:04 AM  Result Value Ref Range   Glucose-Capillary 62 (L) 70 - 99 mg/dL  Glucose, capillary     Status: None   Collection Time: 08/05/18 12:03 PM  Result Value Ref Range   Glucose-Capillary 90 70 - 99 mg/dL  Hemoglobin and hematocrit, blood     Status: Abnormal   Collection Time: 08/05/18  1:23 PM  Result Value Ref Range   Hemoglobin 7.2 (L) 12.0 - 15.0 g/dL   HCT 21.0 (L) 36.0 - 46.0 %    Comment: Performed at [facility], [place]. [address] [place], [place] [address]  Body fluid culture (includes gram stain)     Status: None (Preliminary result)   Collection Time: 08/05/18  2:00 PM   Specimen: Pleural Fluid  Result Value Ref Range   Specimen Description FLUID PLEURAL LEFT    Special Requests NONE    Gram Stain      NO WBC SEEN NO ORGANISMS SEEN Performed at [facility]  [facility], [place] [address], [place], [place] [address]    Culture PENDING    Report Status PENDING   Protein, pleural or peritoneal fluid      Status: None   Collection Time: 08/05/18  2:00 PM  Result Value Ref Range   Total protein, fluid <3.0 g/dL    Comment: REPEATED TO VERIFY   Fluid Type-FTP pleural,L     Comment: Performed at [facility], [place] [address] [place], [place] [address]  Lactate dehydrogenase (pleural or peritoneal fluid)     Status: Abnormal   Collection Time: 08/05/18  2:00 PM  Result Value Ref Range   LD, Fluid 104 (H) 3 - 23 U/L    Comment: (NOTE) Results should be evaluated in conjunction with serum values    Fluid Type-FLDH Pleural, L     Comment: Performed at [facility] [address] [place], [place] [address]  Glucose, pleural or peritoneal fluid     Status: None   Collection Time: 08/05/18  2:00 PM  Result Value Ref Range   Glucose, Fluid 101 mg/dL    Comment: (NOTE) No normal range established for this test Results should be evaluated in conjunction with serum values    Fluid Type-FGLU pleural,left     Comment: Performed at [facility] [address], [place], [place] [address]  Body fluid cell count with differential     Status: Abnormal   Collection Time: 08/05/18  2:00 PM  Result Value Ref Range   Fluid Type-FCT pleural ,left    Color, Fluid YELLOW (A) YELLOW   Appearance, Fluid CLOUDY (A) CLEAR   WBC, Fluid 68 0 - 1,000 cu mm   Neutrophil Count, Fluid 42 (H) 0 - 25 %   Lymphs, Fluid 10 %   Monocyte-Macrophage-Serous Fluid 48 (L) 50 - 90 %   Other Cells, Fluid MESOTHELIAL CELLS PRESENT %    Comment: Performed at [facility], [place] [address] [place], [place] [address]  Lactate dehydrogenase     Status: Abnormal   Collection Time: 08/05/18  3:50 PM  Result Value Ref Range   LDH 203 (H) 98 - 192 U/L    Comment: Performed at [facility], [place] [address], [place], [place] [address]  Protein, total     Status: Abnormal   Collection Time: 08/05/18  3:50 PM  Result Value Ref Range   Total Protein 4.7 (L) 6.5 - 8.1 g/dL    Comment: Performed at [facility] [address], [place], [place] [address]  Glucose, capillary     Status: None   Collection Time: 08/05/18  4:26 PM  Result Value Ref Range   Glucose-Capillary 73 70 - 99 mg/dL  Glucose, capillary     Status: Abnormal   Collection Time: 08/05/18  7:10 PM  Result Value Ref Range   Glucose-Capillary 60 (L) 70 - 99 mg/dL  Glucose, capillary     Status: Abnormal   Collection Time: 08/05/18  7:52 PM  Result Value Ref Range   Glucose-Capillary 114 (H) 70 - 99 mg/dL  Glucose, capillary     Status: Abnormal   Collection Time: 08/05/18 11:09 PM  Result Value Ref Range   Glucose-Capillary 100 (H) 70 - 99 mg/dL  Hemoglobin and hematocrit, blood     Status: Abnormal   Collection Time: 08/05/18 11:30 PM  Result Value Ref Range   Hemoglobin 7.7 (L) 12.0 - 15.0 g/dL  HCT 23.0 (L) 36.0 - 46.0 %    Comment: Performed at [facility], [place] [address], [place], [place] [address]  Glucose, capillary     Status: None   Collection Time: 08/06/18  3:04 AM  Result Value Ref Range   Glucose-Capillary 86 70 - 99 mg/dL  Glucose, capillary     Status: None   Collection Time: 08/06/18  7:04 AM  Result Value Ref Range   Glucose-Capillary 85 70 - 99 mg/dL    Studies/Results: Dg Chest Port 1 View  Result Date: 08/06/2018 CLINICAL DATA:  Respiratory abnormalities EXAM: PORTABLE CHEST 1 VIEW COMPARISON:  August 05, 2018 FINDINGS: A left chest tube terminates over the left apex. The previously identified pneumothorax has resolved. Effusion and opacity in left base are mildly worsened. Probable small layering effusion on the right, similar in the interval. The cardiomediastinal silhouette is stable. No other abnormalities. IMPRESSION: 1. Left chest tube.  No pneumothorax. 2. Left effusion with underlying opacity, mildly worsened. Small right effusion is similar in the interval. Electronically Signed   By: Dorise Bullion III M.D   On: 08/06/2018 08:15   Dg Chest Port 1 View  Result Date:  08/05/2018 CLINICAL DATA:  Chest tube placement EXAM: PORTABLE CHEST 1 VIEW COMPARISON:  August 05, 2018 FINDINGS: A left-sided chest tube is been placed in the interval. The left pneumothorax is much smaller in the interval with only a small a focal component measuring 14 mm identified. Effusion and opacity is seen in the left base. Small right effusion. No other changes. IMPRESSION: 1. A left chest tube terminates near the left apex. The left-sided pneumothorax is much smaller measuring 14 mm at the apex. 2. Bilateral pleural effusions with underlying opacities. Electronically Signed   By: Dorise Bullion III M.D   On: 08/05/2018 15:45   Dg Chest Port 1 View  Result Date: 08/05/2018 CLINICAL DATA:  Status post left thoracentesis. EXAM: PORTABLE CHEST 1 VIEW COMPARISON:  08/05/2018 at 4:33 a.m. FINDINGS: There has been a significant decrease in pleural fluid on the left following thoracentesis. There is now a moderate-sized pneumothorax, likely ex vacuo, 50-60% in size. Minimal residual pleural fluid blunts the lateral left costophrenic sulcus. Stable small right pleural effusion. Stable bilateral vascular congestion. IMPRESSION: 1. Status post left thoracentesis with significant reduction in left pleural fluid, but now with a moderate size pneumothorax, 50-60%, likely ex vacuo. Electronically Signed   By: Lajean Manes M.D.   On: 08/05/2018 14:25   Dg Chest Port 1 View  Result Date: 08/05/2018 CLINICAL DATA:  Respiratory failure. EXAM: PORTABLE CHEST 1 VIEW COMPARISON:  One-view chest x-ray 08/03/2018 FINDINGS: The patient has been extubated. The side port of the NG tube is just beyond the GE junction. Left subclavian line is stable. The heart is enlarged. Left pleural effusion and airspace disease has increased. There is progressive right perihilar and lower lobe airspace disease. A small right pleural effusion is now present. IMPRESSION: 1. Progressive left pleural effusion and airspace disease. 2.  Increasing right pleural effusion and perihilar opacities. 3. Probable congestive heart failure. Electronically Signed   By: San Morelle M.D.   On: 08/05/2018 07:59    Medications: I have reviewed the patient's current medications.  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 If there is further drop in HB or obvious ongoing bleeding, will proceed with EGD and capsule deployment. Currently is on renal diet, has received IV aranesp on 08/05/2018, remains on pantoprazole 40 mg daily. Will continue to follow and monitor.   Ronnette Juniper 08/06/2018, 8:38 AM   Pager [phone] If no answer or after 5 PM call [phone]

## 2018-08-06 NOTE — H&P (View-Only) (Signed)
Received a call from patient's nurse that she had 2 episodes of rectal bleeding, described as bright red in color, 300 cc in amount. Hemoglobin is 6.7, was 7.7 yesterday, 2 units PRBC transfusion has been ordered.  Duodenal AVM noted on EGD from 08/03/2018 treated with cauterization after bleeding scan from 08/03/2018 showed possible stomach or proximal duodenum as source of active bleeding.  Recommend: STAT bleeding scan, IR evaluation if active bleeding noted for possible embolization.  I have asked the patient's nurse Demetria to notify me immediately when result of bleeding scan is available. Meanwhile will prep patient for colonoscopy in a.m.,if colonoscopy is unremarkable plan to proceed with an EGD and small bowel PillCam placement. Switch to IV Protonix drip at 8 mg/hr.  Ronnette Juniper, MD

## 2018-08-06 NOTE — Progress Notes (Signed)
CRITICAL VALUE ALERT  Critical Value:  Hemoglobin 6.7  Date & Time Notied:  08/06/2018 @1100   Provider Notified: Dr. Roger Shelter  Orders Received/Actions taken: Will transfuse 2units of PRBC

## 2018-08-06 NOTE — Progress Notes (Signed)
PROGRESS NOTE    Patient: Courtney [name]                            PCP: Neale Burly, MD                    DOB: 18-Jul-1940            DOA: 07/30/2018 LKJ:[id number]             DOS: 08/06/2018, 11:35 AM   LOS: 7 days    Date of Service: The patient was seen and examined on 08/06/2018   ICU transfer  Subjective:   Patient was seen and examined this morning in bed comfortable, able to communicate, poor historian. Remained to be hypotensive 93/44, currently satting 100% on Per nursing staff has had one dark stool in past 24 hours no frank blood noted  Case was discussed in detail with PCCM attending this morning Would like tried hospital takeover, they will continue to follow accordingly, managing chest tube  Patient had a thoracentesis on 08/05/2018, subsequent developed pneumothorax, chest tube was put in place   Brief Narrative:   Courtney [name] is a 78 year old AAF who was  admitted 6/6 with hypotension and Hgb of 5.7.  Over the course, she started having rectal bleeding requiring transfusions.  On 6/10, had ongoing bleeding becoming hypotensive requiring vasopressors, GI and IR evaluating.    She has a history of ESRD - TTS HD, diastolic HF, stroke- on ASA and plavix, CAD, DM, HTN, and HLD admitted 6/6 with hypotension and Hgb of 5.7.  ASA and plavix held.  She was transfused 2 units of PRBC and admitted for symptomatic anemia.  GI and nephrology consulting.  She was hemoccult positive.  CT abd/pelvis showed no source of bleeding or hematoma, but small amount of gas in the endometrial canal of uncertain etiology with bilateral pleural effusions.  GYN was consulted and recommended no further workup.  Endoscopy was held off given her frailty and high risk.  She started having bloody stool overnight 6/7 and again transfused 1 unit PRBC 6/8. Bowel movements turned blackish/ brown with Hgb stable until she progressively became hypotensive 6/10 with ongoing bloody stools.  She was given given  IVF, 1 unit PRBC and transferred to ICU for close monitoring.  Underwent RBC tagged study which was read as positive for GIB with proximal source either stomach or duodenum.  IR was consulted for possible arteriogram and embolization procedure however he thought there was very minimal bleeding to confidently identify bleeding source and recommended GI re-evaluate for either endoscopy or CTA abd/ pelvis.  While in ICU she loss her PIV access while on levophed and PCCM consulted to assist with further medical management and stabilization.  Patient was noted in respiratory distress was never intubated, thought to be multifactorial including anemia, pleural effusion.  Patient went through thoracentesis on 08/05/2018.  Subsequently developed pneumothorax chest tube was placed by PCCM.  Patient remained stable on O2 via nasal cannula.,  PCCM requested patient to be transferred to the care of [place] on 08/06/2018   Active Problems:   Symptomatic anemia   Pressure injury of skin   S/P thoracentesis   Pneumothorax on left    Assessment & Plan:   Acute on chronic respiratory failure/COPD/pleural effusion/pneumothorax - 08/03/2018 intubation>> for endoscopy,  -Respiratory failure seems to be multifactorial, severe anemia, COPD, currently in no exacerbation, pneumothorax post thoracentesis,  pleural effusion  -Status post thoracentesis on 08/05/2018 -Continue to be O2 dependent via nasal cannula, PCCM following, goal O2 sat 88-92% -Continue PRN bronchodilators -Continue pulmonary toiletry  Acute on chronic anemia -currently progressive ongoing anemia due to ongoing GI bleed -GI following -On admission hemoglobin was 5.7 >>>>  6.7 today  -S/p EGD on 08/03/2018 >>> status post EGD with duodenal AVM oozing. -S/p Tagged red blood cell evaluation 08/03/18>> with a very proximal source, either the stomach or duodenum -It appears the patient had multiple blood transfusion and ICU already -Status post 2 units PRBC  X-fuse 07/30/18  -08/06/2018 hemoglobin 6.7, symptomatic with hypotension >> 2 more units PRBC wil be X-fuse today  -Received IV Aranesp on 08/05/2018, on Protonix 40 mg IV daily   Large left   pleural effusion -Status post thoracentesis 08/05/2018 - -yielding 1000 amount clear pleural fluid -Fluid sent for analysis, culture sensitivity  Pneumothorax -Post thoracentesis -chest tube was placed by PCCM 08/05/2018 -PCCM continue to follow and manage  ESRD  -Nephrology following Dr. Augustin Coupe  - TTS HD,  -Last HD was Monday  Diastolic CHF,  -Monitoring closely -Daily weight -Currently hypotensive, but holding diuretics -Patient will be slowly transfused with PRBC Filed Weights   08/04/18 0627 08/05/18 0715 08/05/18 1050  Weight: 30.3 kg 31.8 kg 31.1 kg   H/o stroke /severe debility - was on ASA and plavix --On hold -Severe generalized debility with cachexia -Once stable PT OT will be consulted for evaluation  Coronary artery disease -Aspirin/Plavix on hold  Diabetes mellitus type 2 -Continue check blood sugar QA CHS, SSI -Blood sugars are stable -Hyperglycemia, patient may not need any further medications -A1c 4.3 on 07/14/17  HTN /remains to be hypotensive -, holding home BP meds  HLD -Holding statins, may not need as the risks outweighs the benefits at her age including the comorbidities     DVT prophylaxis: SCD/Compression stockings Code Status:   Code Status: Full Code Family Communication: No family member present at bedside Disposition Plan:  >5 days, pending stability for evaluation by PT/OT, likely SNF versus LTAC  Consultants: PCCM GI Nephrology IR    Procedures:   6/6 CT abd/ pelvis >> 1. Small amount of gas in the endometrial canal. This is of uncertain etiology and significance, but is abnormal, and could indicate endometritis with infection from gas-forming organisms, or could be related to recent procedure. Further clinical evaluation is recommended. 2.  No definite source for blood loss confidently identified in the abdomen or pelvis. 3. Small volume of ascites. 4. Large left and moderate right pleural effusions with extensive passive atelectasis in the lower lobes of the lungs bilaterally. 5. Aortic atherosclerosis, in addition to left main and 3 vessel coronary artery disease.  6/10 RBC tagged study >> Positive tagged red blood cell examination for gastrointestinal bleed, with a very proximal source, either the stomach or duodenum.  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  08/05/2018-Thoracentesis  -yielding 1000 amount clear pleural fluid 08/05/2018 postthoracentesis pneumothorax -chest tube placement by PCCM    Blood transfusions: S/P blood transfusion ICU approximately 4 units.  2 units of packed red blood cell transfusion 08/06/2018   Antimicrobials:  6/6 SARS coronavirus 2 >> negative 6/8 MRSA PCR >> neg  Anti-infectives (From admission, onward)   None       Medication:   sodium chloride   Intravenous Once   sodium chloride   Intravenous Once   sodium chloride   Intravenous Once   Chlorhexidine Gluconate  Cloth  6 each Topical Q0600   escitalopram  5 mg Oral Daily   multivitamin  1 tablet Oral QHS   Pancrealipase (Lip-Prot-Amyl) CPEP 6,000 units  1 capsule Oral TID WC   pantoprazole (PROTONIX) IV  40 mg Intravenous Q24H    acetaminophen     Objective:   Vitals:   08/06/18 0500 08/06/18 0706 08/06/18 0900 08/06/18 1116  BP: (!) 93/44  (!) 89/47   Pulse: 70  66   Resp: 17  18   Temp:  (!) 97.5 F (36.4 C)  98 F (36.7 C)  TempSrc:  Oral  Oral  SpO2: 100%  100%   Weight:      Height:        Intake/Output Summary (Last 24 hours) at 08/06/2018 1135 Last data filed at 08/06/2018 0600 Gross per 24 hour  Intake --  Output 375 ml  Net -375 ml   Filed Weights   08/04/18 0627 08/05/18 0715 08/05/18 1050  Weight: 30.3 kg 31.8 kg 31.1 kg     Examination:   Physical Exam   Constitution: Cachectic, alert, cooperative, no distress,  Appears calm and comfortable  Psychiatric: Normal and stable mood and affect, cognition intact,   HEENT: Normocephalic, PERRL, otherwise with in Normal limits  Chest:Chest symmetric, left side chest tube in place Cardio vascular:  S1/S2, RRR, No murmure, No Rubs or Gallops  pulmonary: Clear to auscultation bilaterally, respirations unlabored, negative wheezes / crackles Abdomen: Soft, non-tender, non-distended, bowel sounds,no masses, no organomegaly Muscular skeletal:  Severe generalized weaknesses, cachexia, Limited exam - in bed, able to move all 4 extremities, Normal strength,  Neuro: CNII-XII intact. , normal motor and sensation, reflexes intact  Extremities: No pitting edema lower extremities, +2 pulses  Skin: Dry, warm to touch, negative for any Rashes, No open wounds. RUA AVGgood bruit. Wounds: per nursing documentation  LABs:  CBC Latest Ref Rng & Units 08/06/2018 08/05/2018 08/05/2018  WBC 4.0 - 10.5 K/uL 9.7 - -  Hemoglobin 12.0 - 15.0 g/dL 6.7(LL) 7.7(L) 7.2(L)  Hematocrit 36.0 - 46.0 % 20.3(L) 23.0(L) 21.0(L)  Platelets 150 - 400 K/uL 86(L) - -   CMP Latest Ref Rng & Units 08/06/2018 08/05/2018 08/04/2018  Glucose 70 - 99 mg/dL 94 86 150(H)  BUN 8 - 23 mg/dL 36(H) 78(H) 70(H)  Creatinine 0.44 - 1.00 mg/dL 3.13(H) 5.59(H) 4.93(H)  Sodium 135 - 145 mmol/L 136 141 137  Potassium 3.5 - 5.1 mmol/L 4.3 4.3 4.1  Chloride 98 - 111 mmol/L 102 108 103  CO2 22 - 32 mmol/L 22 21(L) 20(L)  Calcium 8.9 - 10.3 mg/dL 7.5(L) 7.5(L) 7.7(L)  Total Protein 6.5 - 8.1 g/dL - 4.7(L) -  Total Bilirubin 0.3 - 1.2 mg/dL - - -  Alkaline Phos 38 - 126 U/L - - -  AST 15 - 41 U/L - - -  ALT 0 - 44 U/L - - -        SIGNED: Deatra James, MD, FACP, FHM. [facility],  Pager [phone]  If 7PM-7AM, please contact night-coverage [email or web address] 08/06/2018, 11:35 AM

## 2018-08-07 ENCOUNTER — Inpatient Hospital Stay (HOSPITAL_COMMUNITY): Payer: Medicare Other

## 2018-08-07 ENCOUNTER — Inpatient Hospital Stay (HOSPITAL_COMMUNITY): Payer: Medicare Other | Admitting: Certified Registered"

## 2018-08-07 ENCOUNTER — Encounter (HOSPITAL_COMMUNITY): Payer: Self-pay | Admitting: *Deleted

## 2018-08-07 ENCOUNTER — Encounter (HOSPITAL_COMMUNITY)
Admission: EM | Disposition: A | Discharge status: Home Health | Payer: Self-pay | Source: Home / Self Care | Attending: Internal Medicine

## 2018-08-07 DIAGNOSIS — R579 Shock, unspecified: Secondary | ICD-10-CM

## 2018-08-07 DIAGNOSIS — Z9889 Other specified postprocedural states: Secondary | ICD-10-CM

## 2018-08-07 DIAGNOSIS — K922 Gastrointestinal hemorrhage, unspecified: Secondary | ICD-10-CM | POA: Diagnosis present

## 2018-08-07 DIAGNOSIS — I959 Hypotension, unspecified: Secondary | ICD-10-CM | POA: Diagnosis present

## 2018-08-07 HISTORY — PX: COLONOSCOPY WITH PROPOFOL: SHX5780

## 2018-08-07 HISTORY — PX: ESOPHAGOGASTRODUODENOSCOPY (EGD) WITH PROPOFOL: SHX5813

## 2018-08-07 HISTORY — PX: GIVENS CAPSULE STUDY: SHX5432

## 2018-08-07 HISTORY — PX: HEMOSTASIS CLIP PLACEMENT: SHX6857

## 2018-08-07 LAB — CBC
HCT: 30.3 % — ABNORMAL LOW (ref 36.0–46.0)
Hemoglobin: 10.7 g/dL — ABNORMAL LOW (ref 12.0–15.0)
MCH: 29.4 pg (ref 26.0–34.0)
MCHC: 35.3 g/dL (ref 30.0–36.0)
MCV: 83.2 fL (ref 80.0–100.0)
Platelets: 89 10*3/uL — ABNORMAL LOW (ref 150–400)
RBC: 3.64 MIL/uL — ABNORMAL LOW (ref 3.87–5.11)
RDW: 17.6 % — ABNORMAL HIGH (ref 11.5–15.5)
WBC: 11.1 10*3/uL — ABNORMAL HIGH (ref 4.0–10.5)
nRBC: 0 % (ref 0.0–0.2)

## 2018-08-07 LAB — BPAM RBC
Blood Product Expiration Date: 202006182359
Blood Product Expiration Date: 202006272359
Blood Product Expiration Date: 202006272359
Blood Product Expiration Date: 202006272359
ISSUE DATE / TIME: 202006100747
ISSUE DATE / TIME: 202006102154
ISSUE DATE / TIME: 202006131236
ISSUE DATE / TIME: 202006131236
Unit Type and Rh: 6200
Unit Type and Rh: 6200
Unit Type and Rh: 6200
Unit Type and Rh: 6200

## 2018-08-07 LAB — GLUCOSE, CAPILLARY
Glucose-Capillary: 102 mg/dL — ABNORMAL HIGH (ref 70–99)
Glucose-Capillary: 102 mg/dL — ABNORMAL HIGH (ref 70–99)
Glucose-Capillary: 76 mg/dL (ref 70–99)
Glucose-Capillary: 79 mg/dL (ref 70–99)
Glucose-Capillary: 89 mg/dL (ref 70–99)
Glucose-Capillary: 92 mg/dL (ref 70–99)

## 2018-08-07 LAB — TYPE AND SCREEN
ABO/RH(D): A POS
Antibody Screen: NEGATIVE
Unit division: 0
Unit division: 0
Unit division: 0
Unit division: 0

## 2018-08-07 LAB — BASIC METABOLIC PANEL
Anion gap: 8 (ref 5–15)
BUN: 51 mg/dL — ABNORMAL HIGH (ref 8–23)
CO2: 24 mmol/L (ref 22–32)
Calcium: 7.5 mg/dL — ABNORMAL LOW (ref 8.9–10.3)
Chloride: 104 mmol/L (ref 98–111)
Creatinine, Ser: 3.64 mg/dL — ABNORMAL HIGH (ref 0.44–1.00)
GFR calc Af Amer: 13 mL/min — ABNORMAL LOW (ref >60)
GFR calc non Af Amer: 11 mL/min — ABNORMAL LOW (ref >60)
Glucose, Bld: 122 mg/dL — ABNORMAL HIGH (ref 70–99)
Potassium: 4.1 mmol/L (ref 3.5–5.1)
Sodium: 136 mmol/L (ref 135–145)

## 2018-08-07 LAB — HEMOGLOBIN AND HEMATOCRIT, BLOOD
HCT: 27.6 % — ABNORMAL LOW (ref 36.0–46.0)
HCT: 30.9 % — ABNORMAL LOW (ref 36.0–46.0)
Hemoglobin: 9.3 g/dL — ABNORMAL LOW (ref 12.0–15.0)
Hemoglobin: 10.3 g/dL — ABNORMAL LOW (ref 12.0–15.0)

## 2018-08-07 SURGERY — COLONOSCOPY WITH PROPOFOL
Anesthesia: Monitor Anesthesia Care

## 2018-08-07 MED ORDER — HYDROMORPHONE HCL 1 MG/ML IJ SOLN
0.5000 mg | Freq: Once | INTRAMUSCULAR | Status: AC
  Administered 2018-08-07: 07:00:00 0.5 mg via INTRAVENOUS
  Filled 2018-08-07: qty 0.5

## 2018-08-07 MED ORDER — SODIUM CHLORIDE 0.9 % IV BOLUS
250.0000 mL | Freq: Once | INTRAVENOUS | Status: AC
  Administered 2018-08-07: 17:00:00 250 mL via INTRAVENOUS

## 2018-08-07 MED ORDER — LACTATED RINGERS IV SOLN
INTRAVENOUS | Status: DC | PRN
  Administered 2018-08-07: 09:00:00 via INTRAVENOUS

## 2018-08-07 MED ORDER — KETAMINE HCL 50 MG/5ML IJ SOSY
PREFILLED_SYRINGE | INTRAMUSCULAR | Status: AC
  Filled 2018-08-07: qty 5

## 2018-08-07 MED ORDER — SODIUM CHLORIDE 0.9 % IV BOLUS
250.0000 mL | Freq: Once | INTRAVENOUS | Status: AC
  Administered 2018-08-07: 18:00:00 250 mL via INTRAVENOUS

## 2018-08-07 MED ORDER — NOREPINEPHRINE 4 MG/250ML-% IV SOLN
0.0000 ug/min | INTRAVENOUS | Status: DC
  Administered 2018-08-07: 2 ug/min via INTRAVENOUS
  Administered 2018-08-08 – 2018-08-09 (×2): 4 ug/min via INTRAVENOUS
  Filled 2018-08-07 (×3): qty 250

## 2018-08-07 MED ORDER — DEXTROSE 5 % IV SOLN
INTRAVENOUS | Status: DC
  Administered 2018-08-07 (×2): via INTRAVENOUS

## 2018-08-07 MED ORDER — HYDROMORPHONE HCL 1 MG/ML IJ SOLN
0.5000 mg | INTRAMUSCULAR | Status: DC | PRN
  Administered 2018-08-07: 14:00:00 0.5 mg via INTRAVENOUS
  Filled 2018-08-07 (×2): qty 0.5

## 2018-08-07 MED ORDER — ONDANSETRON HCL 4 MG/2ML IJ SOLN
4.0000 mg | Freq: Once | INTRAMUSCULAR | Status: AC
  Administered 2018-08-07: 07:00:00 4 mg via INTRAVENOUS
  Filled 2018-08-07: qty 2

## 2018-08-07 MED ORDER — PANTOPRAZOLE SODIUM 40 MG IV SOLR
40.0000 mg | Freq: Two times a day (BID) | INTRAVENOUS | Status: DC
  Administered 2018-08-10 – 2018-08-11 (×3): 40 mg via INTRAVENOUS
  Filled 2018-08-07 (×3): qty 40

## 2018-08-07 MED ORDER — SODIUM CHLORIDE 0.9 % IV SOLN
8.0000 mg/h | INTRAVENOUS | Status: AC
  Administered 2018-08-07 – 2018-08-10 (×7): 8 mg/h via INTRAVENOUS
  Filled 2018-08-07 (×8): qty 80

## 2018-08-07 MED ORDER — SODIUM CHLORIDE 0.9 % IV SOLN: INTRAVENOUS | Status: DC

## 2018-08-07 MED ORDER — KETAMINE HCL 10 MG/ML IJ SOLN
INTRAMUSCULAR | Status: DC | PRN
  Administered 2018-08-07 (×4): 5 mg via INTRAVENOUS

## 2018-08-07 MED ORDER — SODIUM CHLORIDE 0.9 % IV BOLUS
250.0000 mL | Freq: Once | INTRAVENOUS | Status: AC
  Administered 2018-08-07: 250 mL via INTRAVENOUS

## 2018-08-07 MED ORDER — ORAL CARE MOUTH RINSE
15.0000 mL | Freq: Two times a day (BID) | OROMUCOSAL | Status: DC
  Administered 2018-08-07: 15 mL via OROMUCOSAL

## 2018-08-07 MED ORDER — CHLORHEXIDINE GLUCONATE 0.12 % MT SOLN
15.0000 mL | Freq: Two times a day (BID) | OROMUCOSAL | Status: DC
  Administered 2018-08-08: 10:00:00 15 mL via OROMUCOSAL

## 2018-08-07 SURGICAL SUPPLY — 25 items

## 2018-08-07 NOTE — Progress Notes (Signed)
Paged Shahmehdi, MD as pt yelling out in pain that her side hurts. "my side!"  Pt yelling, " OUCH! My side! My side!" Pt HR slowed down to 49 as she holds her breath in pain. MD paged to bedside to assess. See new orders for 250 ml bolus of NS, PRN dilaudid and stat abd CT. Will administer and monitor.  Lucius Conn, RN

## 2018-08-07 NOTE — Progress Notes (Signed)
Paged CCM Yacoub MD . Pt's BP 83/35 (49) despite 250 ml bolus x 2. MD verbal order for one more 250 ml bolus NS and then if needed I can start Levophed. Will administer and monitor closely.   Lucius Conn, RN

## 2018-08-07 NOTE — Progress Notes (Signed)
Called multiple times regarding hypotension post procedure.  No evidence of bleeding.  Likely sedation related.  750 ml NS bolus given with no effect will start levophed.  The patient is critically ill with multiple organ systems failure and requires high complexity decision making for assessment and support, frequent evaluation and titration of therapies, application of advanced monitoring technologies and extensive interpretation of multiple databases.   Critical Care Time devoted to patient care services described in this note is  32  Minutes. This time reflects time of care of this signee Dr Jennet Maduro. This critical care time does not reflect procedure time, or teaching time or supervisory time of PA/NP/Med student/Med Resident etc but could involve care discussion time.  Rush Farmer, M.D. [facility] Pulmonary/Critical Care Medicine. Pager: [phone]. After hours pager: [phone].

## 2018-08-07 NOTE — Brief Op Note (Signed)
07/30/2018 - 08/07/2018  10:10 AM  PATIENT:  Courtney [name]  78 y.o. female  PRE-OPERATIVE DIAGNOSIS:  hematochezia  POST-OPERATIVE DIAGNOSIS:  colon: old blood noted throughout, old blood in TI. few polyps noted not removed EGD: duo ulcer w/ visible vessel, placed clip. pill cam deployed  PROCEDURE:  Procedure(s): COLONOSCOPY WITH PROPOFOL (N/A) ESOPHAGOGASTRODUODENOSCOPY (EGD) WITH PROPOFOL (N/A) GIVENS CAPSULE STUDY (N/A)  SURGEON:  Surgeon(s) and Role:    Ronnette Juniper, MD - Primary  PHYSICIAN ASSISTANT:   ASSISTANTS: Grace Isaac, RN, William Dalton, Tech  ANESTHESIA:   MAC  EBL:  None  BLOOD ADMINISTERED:none  DRAINS: none   LOCAL MEDICATIONS USED:  NONE  SPECIMEN:  No Specimen  DISPOSITION OF SPECIMEN:  N/A  COUNTS:  YES  TOURNIQUET:  * No tourniquets in log *  DICTATION: .Dragon Dictation  PLAN OF CARE: Admit to inpatient   PATIENT DISPOSITION:  PACU - hemodynamically stable.   Delay start of Pharmacological VTE agent (>24hrs) due to surgical blood loss or risk of bleeding: yes

## 2018-08-07 NOTE — Procedures (Signed)
Name:  Courtney [name] MRN:  [id number] DOB:  [birth year]  PROCEDURE NOTE  Procedure:  Ultrasound-guided central venous catheter placement.  Indications:  Need for intravenous access and shock  Consent:  Consent was implied due to the emergency nature of the procedure.  Anesthesia:  A total of 5 mL of 1% Lidocaine was used for local infiltration anesthesia.  Procedure summary:  Appropriate equipment was assembled.  The patient was identified as Courtney [name] and safety timeout was performed. The patient was placed in Trendelenburg position.  Sterile technique was used. The patient's left neck region was prepped using chlorhexidine / alcohol scrub and the field was draped in usual sterile fashion with full body drape. The left internal jugular vein and the left carotid artery were identified by ultrasound, the patency was evaluated and images were documented. After the adequate anesthesia was achieved, the vein was cannulated with the introducer needle under sonographic guidance was not difficulty. A guide wire was advanced through the introducer needle, which was then withdrawn. A small skin incision was made at the point of wire entry, the dilator was inserted over the guide wire and appropriate dilation was obtained. The dilator was removed and 7 French triple-lumen catheter was advanced over the guide wire, which was then removed.  All ports were aspirated and flushed with normal saline without difficulty. The catheter was secured into place with sutures Antibiotic patch was placed and sterile dressing was applied. Post-procedure chest x-ray was ordered.  Complications:  No immediate complications were noted.  Hemodynamic parameters and oxygenation remained stable throughout the procedure.  Estimated blood loss:  Less then 5 mL.  Mali Sheleen Conchas, MD Pulmonary and [place] Pager: [phone]  08/07/2018, 11:41 PM

## 2018-08-07 NOTE — Transfer of Care (Signed)
Immediate Anesthesia Transfer of Care Note  Patient: Courtney [name]  Procedure(s) Performed: COLONOSCOPY WITH PROPOFOL (N/A ) ESOPHAGOGASTRODUODENOSCOPY (EGD) WITH PROPOFOL (N/A ) GIVENS CAPSULE STUDY (N/A )  Patient Location: PACU  Anesthesia Type:MAC  Level of Consciousness: drowsy, patient cooperative and lethargic  Airway & Oxygen Therapy: Patient Spontanous Breathing and Patient connected to nasal cannula oxygen  Post-op Assessment: Report given to RN and Post -op Vital signs reviewed and stable  Post vital signs: Reviewed and stable  Last Vitals:  Vitals Value Taken Time  BP 146/47 08/07/18 1005  Temp 36.5 C 08/07/18 1005  Pulse 67 08/07/18 1005  Resp 12 08/07/18 1010  SpO2 100 % 08/07/18 1005  Vitals shown include unvalidated device data.  Last Pain:  Vitals:   08/07/18 1005  TempSrc: Temporal  PainSc:       Patients Stated Pain Goal: 0 (96/94/09 8286)  Complications: No apparent anesthesia complications

## 2018-08-07 NOTE — Progress Notes (Signed)
[facility] Progress Note    Assessment/ Plan:   78 year old BF with multiple medical problems including ESRD. Presents with weakness found to have hgb of 5.7 1hgb of 5.7- on 5/27 was 8.5 when she came for access procedure. Son denies overt bleeding. Do not have recent information from the kidney center about iron or ESA dosing.s/p2 units on 6/6.Dosedwith ESA. GIinitially didnot to consider invasive evalbec of high mortality risk w/ the procedure but bec of recurrent bldg performed a EGD with enteroscopy 6/10 -> ooze from AVM in 2nd portion of the duodenum. They are not convinced that accounts for all the blood loss.Unfortunately BRBPR overnight and going for colonscopy today.  2 ESRD:normally TTS at [facility] via [facility].    HD this week Mon and Fri.  Plan next HD for Mon; she has been through a lot and I don't want to stress her again. BP also soft.  3 Hypertension:BP is low- looks like at home is on hydralazine and lasix- being held.   5. Metabolic Bone Disease -Phosis 4.7; hold off on binder for now. Will recheck Mon.  Subjective:   Bleeding againevening of 6/10-> EGD with enteroscopy 6//10 finding only AVM in 2nd portion of duodenum w/ slow ooze.  BRBPR overnight and she is on golytely.  Denies dyspnea but has abdominal pain   Objective:   BP (!) 103/45   Pulse 67   Temp 97.6 F (36.4 C) (Oral)   Resp 20   Ht 4\' 11"  (1.499 m)   Wt 31 kg   SpO2 100%   BMI 13.80 kg/m   Intake/Output Summary (Last 24 hours) at 08/07/2018 0744 Last data filed at 08/07/2018 0600 Gross per 24 hour  Intake 2616.47 ml  Output 590 ml  Net 2026.47 ml   Weight change: -0.8 kg   Physical Exam: General:Extubated, comfortable appearing. Heart:RRR Lungs:rt sided CT Abdomen:nontender Extremities:no edema Dialysis Access:RUA AVGgood bruit  Imaging: Dg Abd 1 View  Result Date: 08/06/2018 CLINICAL DATA:  Reason for exam: Encounter for  nasogastric (NG) tube placement EXAM: ABDOMEN - 1 VIEW COMPARISON:  Radiograph 08/04/2018 FINDINGS: NG tube extends the stomach. No dilated large or small bowel. Extensive vascular calcification noted. IMPRESSION: NG tube in stomach. Electronically Signed   By: Suzy Bouchard M.D.   On: 08/06/2018 18:47   Nm Gi Blood Loss  Result Date: 08/06/2018 CLINICAL DATA:  GI bleed, anemia, hemoglobin 6.6 g EXAM: NUCLEAR MEDICINE GASTROINTESTINAL BLEEDING SCAN TECHNIQUE: Sequential abdominal images were obtained following intravenous administration of Tc-55m labeled red blood cells. RADIOPHARMACEUTICALS:  25.8 mCi Tc-74m pertechnetate in-vitro labeled red cells. COMPARISON:  None Correlation: CT abdomen pelvis 07/30/2018 FINDINGS: Imaging performed for 2 hours. Normal blood pool distribution of labeled red cells. No abnormal gastrointestinal localization of tracer identified. IMPRESSION: No scintigraphic evidence of active GI bleeding. Electronically Signed   By: Lavonia Dana M.D.   On: 08/06/2018 16:43   Dg Chest Port 1 View  Result Date: 08/07/2018 CLINICAL DATA:  Follow-up left pneumothorax. End-stage renal disease on dialysis. Congestive heart failure. EXAM: PORTABLE CHEST 1 VIEW COMPARISON:  08/06/2018 FINDINGS: Left pleural pigtail catheter remains in place. Small left apex: Basilar pneumothorax shows no significant change in size. Left lower lung pleural-parenchymal scarring again noted. Cardiomegaly stable. Worsening asymmetric airspace disease is seen throughout the right lung, and increased size of small layering right pleural effusion is noted. IMPRESSION: 1. No significant change in small left pneumothorax. Left pleural pigtail catheter remains in place. 2. Worsening asymmetric  airspace disease throughout right lung, and increased small layering right pleural effusion. 3. Stable cardiomegaly. Electronically Signed   By: Earle Gell M.D.   On: 08/07/2018 05:53   Dg Chest Port 1 View  Result Date:  08/06/2018 CLINICAL DATA:  Pneumothorax EXAM: PORTABLE CHEST 1 VIEW COMPARISON:  Portable exam 1239 hours compared to 0455 hours FINDINGS: Pigtail LEFT thoracostomy tube again seen. Small pneumothorax at LEFT apex. Question loculated pneumothorax versus skin folds at inferior LEFT chest. Improving aeration at LEFT lung base. Small RIGHT pleural effusion and basilar atelectasis with remaining RIGHT lung clear. Stable heart size mediastinal contours. IMPRESSION: Small LEFT apically and question lateral LEFT basilar pneumothorax despite thoracostomy tube. Bibasilar atelectasis, with improved aeration on LEFT. Electronically Signed   By: Lavonia Dana M.D.   On: 08/06/2018 14:42   Dg Chest Port 1 View  Result Date: 08/06/2018 CLINICAL DATA:  Respiratory abnormalities EXAM: PORTABLE CHEST 1 VIEW COMPARISON:  August 05, 2018 FINDINGS: A left chest tube terminates over the left apex. The previously identified pneumothorax has resolved. Effusion and opacity in left base are mildly worsened. Probable small layering effusion on the right, similar in the interval. The cardiomediastinal silhouette is stable. No other abnormalities. IMPRESSION: 1. Left chest tube.  No pneumothorax. 2. Left effusion with underlying opacity, mildly worsened. Small right effusion is similar in the interval. Electronically Signed   By: Dorise Bullion III M.D   On: 08/06/2018 08:15   Dg Chest Port 1 View  Result Date: 08/05/2018 CLINICAL DATA:  Chest tube placement EXAM: PORTABLE CHEST 1 VIEW COMPARISON:  August 05, 2018 FINDINGS: A left-sided chest tube is been placed in the interval. The left pneumothorax is much smaller in the interval with only a small a focal component measuring 14 mm identified. Effusion and opacity is seen in the left base. Small right effusion. No other changes. IMPRESSION: 1. A left chest tube terminates near the left apex. The left-sided pneumothorax is much smaller measuring 14 mm at the apex. 2. Bilateral pleural  effusions with underlying opacities. Electronically Signed   By: Dorise Bullion III M.D   On: 08/05/2018 15:45   Dg Chest Port 1 View  Result Date: 08/05/2018 CLINICAL DATA:  Status post left thoracentesis. EXAM: PORTABLE CHEST 1 VIEW COMPARISON:  08/05/2018 at 4:33 a.m. FINDINGS: There has been a significant decrease in pleural fluid on the left following thoracentesis. There is now a moderate-sized pneumothorax, likely ex vacuo, 50-60% in size. Minimal residual pleural fluid blunts the lateral left costophrenic sulcus. Stable small right pleural effusion. Stable bilateral vascular congestion. IMPRESSION: 1. Status post left thoracentesis with significant reduction in left pleural fluid, but now with a moderate size pneumothorax, 50-60%, likely ex vacuo. Electronically Signed   By: Lajean Manes M.D.   On: 08/05/2018 14:25    Labs: BMET Recent Labs  Lab 08/01/18 0202 08/03/18 0247 08/03/18 2231 08/03/18 2248 08/04/18 0610 08/05/18 0445 08/06/18 0836 08/07/18 0318  NA 141 139 136 136 137 141 136 136  K 4.3 3.5 4.0 4.3 4.1 4.3 4.3 4.1  CL 105 103  --   --  103 108 102 104  CO2 22 25  --   --  20* 21* 22 24  GLUCOSE 95 95  --   --  150* 86 94 122*  BUN 94* 46*  --   --  70* 78* 36* 51*  CREATININE 6.29* 4.00*  --   --  4.93* 5.59* 3.13* 3.64*  CALCIUM 7.3* 7.7*  --   --  7.7* 7.5* 7.5* 7.5*  PHOS  --  4.2  --   --  4.2 4.7* 2.9  --    CBC Recent Labs  Lab 08/04/18 0610  08/05/18 0715  08/05/18 2330 08/06/18 0836 08/06/18 2118 08/07/18 0318  WBC 15.8*  --  12.4*  --   --  9.7  --  11.1*  HGB 8.9*   < > 7.2*   < > 7.7* 6.7* 11.7* 10.7*  HCT 25.7*   < > 21.6*   < > 23.0* 20.3* 33.4* 30.3*  MCV 89.9  --  93.1  --   --  96.7  --  83.2  PLT 90*  --  80*  --   --  86*  --  89*   < > = values in this interval not displayed.    Medications:    . sodium chloride   Intravenous Once  . sodium chloride   Intravenous Once  . Chlorhexidine Gluconate Cloth  6 each Topical Q0600  .  escitalopram  5 mg Oral Daily  . multivitamin  1 tablet Oral QHS  . Pancrealipase (Lip-Prot-Amyl) CPEP 6,000 units  1 capsule Oral TID WC  . [START ON 08/10/2018] pantoprazole  40 mg Intravenous Q12H  . polyethylene glycol-electrolytes  4,000 mL Oral Once      Otelia Santee, MD 08/07/2018, 7:44 AM

## 2018-08-07 NOTE — Progress Notes (Signed)
Pt body temp now 95.6 axillary. Bare Hugger applied immediately. Will continue to monitor closely.  Lucius Conn, RN

## 2018-08-07 NOTE — Op Note (Signed)
[facility] Patient Name: Courtney [name] Procedure Date : 08/07/2018 MRN: [id number] Attending MD: Ronnette Juniper , MD Date of Birth: [birth year] CSN: [id number] Age: 78 Admit Type: Inpatient Procedure:                Upper GI endoscopy Indications:              Acute post hemorrhagic anemia, Melena Providers:                Ronnette Juniper, MD, Grace Isaac, RN, William Dalton,                            Technician Referring MD:              Medicines:                Monitored Anesthesia Care Complications:            No immediate complications. Estimated Blood Loss:     Estimated blood loss: none. Procedure:                Pre-Anesthesia Assessment:                           - Prior to the procedure, a History and Physical                            was performed, and patient medications and                            allergies were reviewed. The patient's tolerance of                            previous anesthesia was also reviewed. The risks                            and benefits of the procedure and the sedation                            options and risks were discussed with the patient.                            All questions were answered, and informed consent                            was obtained. Prior Anticoagulants: The patient has                            taken Plavix (clopidogrel), last dose was 10 days                            prior to procedure. ASA Grade Assessment: III - A                            patient with severe systemic disease. After  reviewing the risks and benefits, the patient was                            deemed in satisfactory condition to undergo the                            procedure.                           - Prior to the procedure, a History and Physical                            was performed, and patient medications and                            allergies were reviewed. The patient's tolerance of                    previous anesthesia was also reviewed. The risks                            and benefits of the procedure and the sedation                            options and risks were discussed with the patient.                            All questions were answered, and informed consent                            was obtained. Prior Anticoagulants: The patient has                            taken Plavix (clopidogrel), last dose was 10 days                            prior to procedure. ASA Grade Assessment: III - A                            patient with severe systemic disease. After                            reviewing the risks and benefits, the patient was                            deemed in satisfactory condition to undergo the                            procedure.                           After obtaining informed consent, the endoscope was                            passed under direct  vision. Throughout the                            procedure, the patient's blood pressure, pulse, and                            oxygen saturations were monitored continuously. The                            [id number]-[id number] ([id number]) Olympus gastroscope was                            introduced through the mouth, and advanced to the                            second part of duodenum. The upper GI endoscopy was                            accomplished without difficulty. The patient                            tolerated the procedure well. Scope In: Scope Out: Findings:      The examined esophagus was normal.      The Z-line was regular and was found 32 cm from the incisors.      Old red blood was found in the gastric fundus- small amount, likely       related to NG tube induced trauma.      The cardia and gastric fundus were normal on retroflexion.      A moderate post-ulcer deformity was found in the first portion of the       duodenum.      One non-bleeding cratered duodenal ulcer with a visible  vessel was found       in the first portion of the duodenum. The lesion was 7 mm in largest       dimension. To close a defect after mucosal resection, one hemostatic       clip was successfully placed. There was no bleeding at the end of the       procedure. Using the endoscope, the video capsule enteroscope was       advanced into the duodenal bulb. Impression:               - Normal esophagus.                           - Z-line regular, 32 cm from the incisors.                           - Small amount of old red blood in the gastric                            fundus, likely related to NG tube induced trauma.                           - Duodenal deformity.                           -  Non-bleeding duodenal ulcer with a visible                            vessel. Clip was placed.                           - Successful completion of the Video Capsule                            Enteroscope placement.                           - No specimens collected. Moderate Sedation:      Patient did not receive moderate sedation for this procedure, but       instead received monitored anesthesia care. Recommendation:           - NPO.                           - Give Protonix (pantoprazole): 8 mg/hr IV by                            continuous infusion.                           -Monitor H and H and transfuse if Hb is less than 8. Procedure Code(s):        --- Professional ---                           [phone], Esophagogastroduodenoscopy, flexible,                            transoral; diagnostic, including collection of                            specimen(s) by brushing or washing, when performed                            (separate procedure) Diagnosis Code(s):        --- Professional ---                           K92.2, Gastrointestinal hemorrhage, unspecified                           K31.89, Other diseases of stomach and duodenum                           K26.4, Chronic or unspecified duodenal ulcer  with                            hemorrhage                           D62, Acute posthemorrhagic anemia  K92.1, Melena (includes Hematochezia) CPT copyright 2019 American Medical Association. All rights reserved. The codes documented in this report are preliminary and upon coder review may  be revised to meet current compliance requirements. Ronnette Juniper, MD 08/07/2018 10:10:33 AM This report has been signed electronically. Number of Addenda: 0

## 2018-08-07 NOTE — Op Note (Signed)
[facility] Patient Name: Courtney [name] Procedure Date : 08/07/2018 MRN: [id number] Attending MD: Ronnette Juniper , MD Date of Birth: 1940-05-15 CSN: [id number] Age: 78 Admit Type: Inpatient Procedure:                Colonoscopy Indications:              Rectal bleeding, Acute post hemorrhagic anemia Providers:                Ronnette Juniper, MD, Grace Isaac, RN, William Dalton,                            Technician Referring MD:              Medicines:                Monitored Anesthesia Care Complications:            No immediate complications. Estimated Blood Loss:     Estimated blood loss: none. Procedure:                Pre-Anesthesia Assessment:                           - Prior to the procedure, a History and Physical                            was performed, and patient medications and                            allergies were reviewed. The patient's tolerance of                            previous anesthesia was also reviewed. The risks                            and benefits of the procedure and the sedation                            options and risks were discussed with the patient.                            All questions were answered, and informed consent                            was obtained. Prior Anticoagulants: The patient has                            taken Plavix (clopidogrel), last dose was 10 days                            prior to procedure. ASA Grade Assessment: III - A                            patient with severe systemic disease. After  reviewing the risks and benefits, the patient was                            deemed in satisfactory condition to undergo the                            procedure.                           After obtaining informed consent, the colonoscope                            was passed under direct vision. Throughout the                            procedure, the patient's blood pressure, pulse, and                           oxygen saturations were monitored continuously. The                            [id number]-PH190L ([id number]) Olympus ultra slim colonoscope                            was introduced through the anus and advanced to the                            the terminal ileum. The colonoscopy was performed                            without difficulty. The patient tolerated the                            procedure well. The quality of the bowel                            preparation was poor. Scope In: 9:18:58 AM Scope Out: 9:41:19 AM Scope Withdrawal Time: 0 hours 12 minutes 28 seconds  Total Procedure Duration: 0 hours 22 minutes 21 seconds  Findings:      The perianal and digital rectal examinations were normal.      Old red blood was found in the entire colon.      A few polyps were found in the descending colon and transverse colon.       The polyps were small in size. Polypectomy was not attempted due to poor       endoscopic visualization.      The terminal ileum contained hematin (altered blood/coffee-ground-like       material).      The retroflexed view of the distal rectum and anal verge was normal and       showed no anal or rectal abnormalities.      No active bleeding noted on thorough lavage. Impression:               - Preparation of the colon was poor.                           -  Old blood in the entire examined colon.                           - A few small polyps in the descending colon and in                            the transverse colon. Resection not attempted.                           - Old blood in the terminal ileum.                           - The distal rectum and anal verge are normal on                            retroflexion view.                           - No specimens collected. Moderate Sedation:      Patient did not receive moderate sedation for this procedure, but       instead received monitored anesthesia care. Recommendation:           -  Perform an upper GI endoscopy today.                           - To visualize the small bowel, perform video                            capsule endoscopy today. Procedure Code(s):        --- Professional ---                           [phone], Colonoscopy, flexible; diagnostic, including                            collection of specimen(s) by brushing or washing,                            when performed (separate procedure) Diagnosis Code(s):        --- Professional ---                           K92.2, Gastrointestinal hemorrhage, unspecified                           K63.5, Polyp of colon                           K62.5, Hemorrhage of anus and rectum                           D62, Acute posthemorrhagic anemia CPT copyright 2019 American Medical Association. All rights reserved. The codes documented in this report are preliminary and upon coder review may  be revised to meet current compliance requirements. Ronnette Juniper, MD 08/07/2018 10:05:12 AM This report has been signed electronically.  Number of Addenda: 0 

## 2018-08-07 NOTE — Progress Notes (Signed)
Offered Elink Video call with patient to daughter. Daughter stated that she wanted the link but that she would call later this evening. Elink sent the link, but daughter did not camera in. Will offer video visit again.  Lucius Conn, RN

## 2018-08-07 NOTE — Progress Notes (Signed)
MD verbal order for another 250 bolus NS.

## 2018-08-07 NOTE — Anesthesia Postprocedure Evaluation (Signed)
Anesthesia Post Note  Patient: Courtney [name]  Procedure(s) Performed: COLONOSCOPY WITH PROPOFOL (N/A ) ESOPHAGOGASTRODUODENOSCOPY (EGD) WITH PROPOFOL (N/A ) GIVENS CAPSULE STUDY (N/A )     Patient location during evaluation: Endoscopy Anesthesia Type: MAC Level of consciousness: awake and alert Pain management: pain level controlled Vital Signs Assessment: post-procedure vital signs reviewed and stable Respiratory status: spontaneous breathing, nonlabored ventilation and respiratory function stable Cardiovascular status: stable and blood pressure returned to baseline Postop Assessment: no apparent nausea or vomiting Anesthetic complications: no    Last Vitals:  Vitals:   08/07/18 1005 08/07/18 1015  BP: (!) 146/47 (!) 127/47  Pulse: 67 67  Resp: 13 13  Temp:    SpO2: 100% 100%    Last Pain:  Vitals:   08/07/18 1005  TempSrc: Temporal  PainSc:                  Bauer Ausborn,W. EDMOND

## 2018-08-07 NOTE — Interval H&P Note (Signed)
History and Physical Interval Note: 77/female with ongoing rectal bleeding, duodenal AVM treated recently, for a colonoscopy, possible repeat EGD and pillcam deployment.  08/07/2018 8:48 AM  Courtney [name]  has presented today for colonoscopy, possible EGD and pillcam deployment, with the diagnosis of hematochezia.  The various methods of treatment have been discussed with the patient and family. After consideration of risks, benefits and other options for treatment, the patient has consented to  Procedure(s): COLONOSCOPY WITH PROPOFOL (N/A) ESOPHAGOGASTRODUODENOSCOPY (EGD) WITH PROPOFOL (N/A) GIVENS CAPSULE STUDY (N/A) as a surgical intervention.  The patient's history has been reviewed, patient examined, no change in status, stable for surgery.  I have reviewed the patient's chart and labs.  Questions were answered to the patient's satisfaction.     Ronnette Juniper

## 2018-08-07 NOTE — Progress Notes (Signed)
PROGRESS NOTE    Patient: Courtney [name]                            PCP: Neale Burly, MD                    DOB: 08-Aug-1940            DOA: 07/30/2018 ZOX:[id number]             DOS: 08/07/2018, 1:18 PM   LOS: 8 days    Date of Service: The patient was seen and examined on 08/07/2018   ICU transfer  Subjective:   The patient continues to be in critical state yesterday with hypotension and active rectal bleed.  Blood pressure is low was 83/34, with anemia, was transfused with 2 units of packed red blood cell.  GI was notified. Blood pressure has improved this morning 100/44.  Pulse 61 respiratory rate 14 satting 100% on O2 via nasal cannula.  Stat tagged red blood cell scan was repeated yesterday, no obvious sites of bleeding was identified. Patient was prepped, GI has taken the patient for colonoscopy, also anticipating upper endoscopy today. HR 63, o2 100% RA, BP 113/47. CT output 70 ml.  Case was discussed in detail with nursing staff and PCCM team. Patient had a thoracentesis on 08/05/2018, subsequent developed pneumothorax, chest tube was put in place   Brief Narrative:   [name] is a 78 year old AAF who was  admitted 6/6 with hypotension and Hgb of 5.7.  Over the course, she started having rectal bleeding requiring transfusions.  On 6/10, had ongoing bleeding becoming hypotensive requiring vasopressors, GI and IR evaluating.    She has a history of ESRD - TTS HD, diastolic HF, stroke- on ASA and plavix, CAD, DM, HTN, and HLD admitted 6/6 with hypotension and Hgb of 5.7.  ASA and plavix held.  She was transfused 2 units of PRBC and admitted for symptomatic anemia.  GI and nephrology consulting.  She was hemoccult positive.  CT abd/pelvis showed no source of bleeding or hematoma, but small amount of gas in the endometrial canal of uncertain etiology with bilateral pleural effusions.  GYN was consulted and recommended no further workup.  Endoscopy was held off given her frailty and  high risk.  She started having bloody stool overnight 6/7 and again transfused 1 unit PRBC 6/8. Bowel movements turned blackish/ brown with Hgb stable until she progressively became hypotensive 6/10 with ongoing bloody stools.  She was given given IVF, 1 unit PRBC and transferred to ICU for close monitoring.  Underwent RBC tagged study which was read as positive for GIB with proximal source either stomach or duodenum.  IR was consulted for possible arteriogram and embolization procedure however he thought there was very minimal bleeding to confidently identify bleeding source and recommended GI re-evaluate for either endoscopy or CTA abd/ pelvis.  While in ICU she loss her PIV access while on levophed and PCCM consulted to assist with further medical management and stabilization.  Patient was noted in respiratory distress was never intubated, thought to be multifactorial including anemia, pleural effusion.  Patient went through thoracentesis on 08/05/2018.  Subsequently developed pneumothorax chest tube was placed by PCCM.  Patient remained stable on O2 via nasal cannula.,  PCCM requested patient to be transferred to the care of [facility] on 08/06/2018    Assessment & Plan:    Active Problems:  Symptomatic anemia GI bleed Acute respiratory failure Hypotensive Diastolic congestive heart failure Pressure injury of skin  S/P thoracentesis  Pneumothorax on left ESRD H/o stroke /severe debility   Acute on chronic anemia -currently progressive ongoing anemia due to ongoing GI bleed -Continue report of active rectal bleed -GI following -Repeat tagged red blood cell on 08/06/2018 revealed no active signs of bleeding -Status post prep for colonoscopy and upper endoscopy today 08/07/2018 Pending final results  -On admission hemoglobin was 5.7 >>>>  6.7 >> s/p 2U PRBC X-fuse >> 9.3 -S/p EGD on 08/03/2018 >>> status post EGD with duodenal AVM oozing. -S/p Tagged red blood cell evaluation 08/03/18>> with a  very proximal source, either the stomach or duodenum -It appears the patient had multiple blood transfusion and ICU already -Status post 2 units PRBC X-fuse 07/30/18  -08/06/2018 hemoglobin 6.7, symptomatic with hypotension >> 2 more units PRBC wil be X-fuse today  -Received IV Aranesp on 08/05/2018, - -Patient was switched to Protonix drip on 08/06/2018 >>    Pneumothorax -Post thoracentesis -chest tube was placed by PCCM 08/05/2018 -CT output 70 ml. -PCCM continue to follow and manage  Acute on chronic respiratory failure/COPD/pleural effusion/pneumothorax - 08/03/2018 intubation>> for endoscopy,  -Respiratory failure seems to be multifactorial, severe anemia, COPD, currently in no exacerbation, pneumothorax post thoracentesis, pleural effusion  -Status post thoracentesis on 08/05/2018 -Continue to be O2 dependent via nasal cannula, PCCM following, goal O2 sat 88-92% -Continue PRN bronchodilators -Continue pulmonary toiletry   Large left   pleural effusion -Status post thoracentesis 08/05/2018 - -yielding 1000 amount clear pleural fluid -Fluid sent for analysis, culture sensitivity   ESRD  -Nephrology following Dr. Augustin Coupe  - TTS HD,  -Hemodialysis today 3/50/0938  Diastolic CHF,  -Monitoring closely -Daily weight -Currently hypotensive, but holding diuretics -Patient will be slowly transfused with PRBC Filed Weights   08/05/18 0715 08/05/18 1050 08/07/18 0630  Weight: 31.8 kg 31.1 kg 31 kg   H/o stroke /severe debility - was on ASA and plavix --On hold -Severe generalized debility with cachexia -Once stable PT OT will be consulted for evaluation  Coronary artery disease -Aspirin/Plavix on hold  Diabetes mellitus type 2 -Continue check blood sugar QA CHS, SSI -Blood sugars are stable -Hyperglycemia, patient may not need any further medications -A1c 4.3 on 07/14/17  HTN /remains to be hypotensive -, holding home BP meds  HLD -Holding statins, may not need as the risks  outweighs the benefits at her age including the comorbidities     DVT prophylaxis: SCD/Compression stockings Code Status:   Code Status: Full Code Family Communication: Patient's RN has updated her daughter today.  We will attempt to call later on today. . Disposition Plan:  >5 days, pending stability for evaluation by PT/OT, likely SNF versus LTAC  Patient status remains to be critical with hypotension, active GI bleed, pneumothorax. We appreciate PCCM and gastroenterology management will follow along.    Consultants: PCCM GI Nephrology IR    Procedures:   6/6 CT abd/ pelvis >> 1. Small amount of gas in the endometrial canal. This is of uncertain etiology and significance, but is abnormal, and could indicate endometritis with infection from gas-forming organisms, or could be related to recent procedure. Further clinical evaluation is recommended. 2. No definite source for blood loss confidently identified in the abdomen or pelvis. 3. Small volume of ascites. 4. Large left and moderate right pleural effusions with extensive passive atelectasis in the lower lobes of the lungs bilaterally. 5. Aortic atherosclerosis, in  addition to left main and 3 vessel coronary artery disease.  6/10 RBC tagged study >> Positive tagged red blood cell examination for gastrointestinal bleed, with a very proximal source, either the stomach or duodenum.  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  08/05/2018-Thoracentesis  -yielding 1000 amount clear pleural fluid 08/05/2018 postthoracentesis pneumothorax -chest tube placement by PCCM 08/06/2018-tagged red blood cell scan-negative for any active bleeding report 08/07/2018 -colonoscopy/upper endoscopy  Blood transfusions: S/P blood transfusion ICU approximately 4 units. 2 units of packed red blood cell transfusion 08/06/2018   Antimicrobials:  6/6 SARS coronavirus 2 >> negative 6/8 MRSA PCR >> neg  Anti-infectives (From admission,  onward)   None       Medication:  . Chlorhexidine Gluconate Cloth  6 each Topical Q0600  . escitalopram  5 mg Oral Daily  . multivitamin  1 tablet Oral QHS  . Pancrealipase (Lip-Prot-Amyl) CPEP 6,000 units  1 capsule Oral TID WC  . [START ON 08/10/2018] pantoprazole  40 mg Intravenous Q12H    acetaminophen, dextrose     Objective:   Vitals:   08/07/18 1215 08/07/18 1217 08/07/18 1230 08/07/18 1245  BP: (!) 105/44  (!) 98/42 (!) 89/43  Pulse: (!) 57  62 (!) 59  Resp: 17 12 20 18   Temp:      TempSrc:      SpO2: 100%  100% 100%  Weight:      Height:        Intake/Output Summary (Last 24 hours) at 08/07/2018 1318 Last data filed at 08/07/2018 1222 Gross per 24 hour  Intake 2451.47 ml  Output 710 ml  Net 1741.47 ml   Filed Weights   08/05/18 0715 08/05/18 1050 08/07/18 0630  Weight: 31.8 kg 31.1 kg 31 kg     Examination:   Physical Exam  Patient remained to be hypotensive, overnight, active slow rectal bleed Constitution: Cachectic, alert, following command,  HEENT:  within normal limits, normocephalic,Normocephalic, PERRL,  Chest: No changes, chest symmetric, left side chest tube in place Cardio vascular:  S1/S2, RRR, No murmure, No Rubs or Gallops  pulmonary: Reduced breath sounds on the left, positive air entry, negative crackles negative and rhonchi Abdomen: Soft, nontender, nondistended, no masses, no organomegaly Muscular skeletal:  Severe generalized weaknesses, cachexia, Limited exam - in bed, able to move all 4 extremities, Normal strength,  Neuro: Limited exam, patient is mildly sedated from narcotics Extremities: No pitting edema lower extremities, +2 pulses  Skin: Dry, warm to touch, negative for any Rashes, No open wounds. RUA AVGgood bruit. Wounds: per nursing documentation  LABs:  CBC Latest Ref Rng & Units 08/07/2018 08/07/2018 08/06/2018  WBC 4.0 - 10.5 K/uL - 11.1(H) -  Hemoglobin 12.0 - 15.0 g/dL 9.3(L) 10.7(L) 11.7(L)  Hematocrit 36.0 -  46.0 % 27.6(L) 30.3(L) 33.4(L)  Platelets 150 - 400 K/uL - 89(L) -   CMP Latest Ref Rng & Units 08/07/2018 08/06/2018 08/05/2018  Glucose 70 - 99 mg/dL 122(H) 94 86  BUN 8 - 23 mg/dL 51(H) 36(H) 78(H)  Creatinine 0.44 - 1.00 mg/dL 3.64(H) 3.13(H) 5.59(H)  Sodium 135 - 145 mmol/L 136 136 141  Potassium 3.5 - 5.1 mmol/L 4.1 4.3 4.3  Chloride 98 - 111 mmol/L 104 102 108  CO2 22 - 32 mmol/L 24 22 21(L)  Calcium 8.9 - 10.3 mg/dL 7.5(L) 7.5(L) 7.5(L)  Total Protein 6.5 - 8.1 g/dL - - 4.7(L)  Total Bilirubin 0.3 - 1.2 mg/dL - - -  Alkaline Phos 38 - 126 U/L - - -  AST 15 - 41 U/L - - -  ALT 0 - 44 U/L - - -     SIGNED: Deatra James, MD, FACP, FHM. [facility],  Pager [phone]  If 7PM-7AM, please contact night-coverage [email or web address] 08/07/2018, 1:18 PM

## 2018-08-07 NOTE — Anesthesia Preprocedure Evaluation (Addendum)
Anesthesia Evaluation  Patient identified by MRN, date of birth, ID band Patient confused    Reviewed: Allergy & Precautions, H&P , NPO status , Patient's Chart, lab work & pertinent test results  Airway Mallampati: II  TM Distance: >3 FB Neck ROM: Full    Dental no notable dental hx. (+) Edentulous Upper, Edentulous Lower, Dental Advisory Given   Pulmonary neg pulmonary ROS,    Pulmonary exam normal breath sounds clear to auscultation       Cardiovascular hypertension, Pt. on medications + CAD and +CHF   Rhythm:Regular Rate:Normal     Neuro/Psych CVA negative psych ROS   GI/Hepatic negative GI ROS, Neg liver ROS,   Endo/Other  diabetes  Renal/GU ESRF and DialysisRenal disease  negative genitourinary   Musculoskeletal   Abdominal   Peds  Hematology  (+) Blood dyscrasia, anemia ,   Anesthesia Other Findings   Reproductive/Obstetrics negative OB ROS                            Anesthesia Physical Anesthesia Plan  ASA: III  Anesthesia Plan: MAC   Post-op Pain Management:    Induction: Intravenous  PONV Risk Score and Plan: 2 and Propofol infusion and Ondansetron  Airway Management Planned: Nasal Cannula  Additional Equipment:   Intra-op Plan:   Post-operative Plan:   Informed Consent: I have reviewed the patients History and Physical, chart, labs and discussed the procedure including the risks, benefits and alternatives for the proposed anesthesia with the patient or authorized representative who has indicated his/her understanding and acceptance.     Dental advisory given  Plan Discussed with: CRNA  Anesthesia Plan Comments:         Anesthesia Quick Evaluation

## 2018-08-07 NOTE — Progress Notes (Signed)
I placed a stat portable abdomen x ray ordered at 07:11 for NG tube placement as patient had pulled NG tube on pervious shift and night shift RN had to replace it at 0700 per RN bedside report. Per report, bowel regimen not complete yet because of pulled NG tube and multiple bloody BM's with abdominal pain which was treated with .5mg   of Dilaudid at 0655. Both RN's at bedside monitoring pt's BP (see chart for vitals). Pt 100% on Room air but sedated.   [place] in ROOM.  Stat PORT ABD see reults. [place] called daughter [name] for consent for Colonoscopy (ordered at 1317 on 06/13) as patient was sedated from the dilaudid and couldn't answer orientation questions upon assessment. Daughter states, "She needs a coloposcopy?! I was not aware she needed this procedure." Daughter states, " The last thing I heard yesterday, she was doing good and and had stopped bleeding."  Daughter updated on events from yesterday per notes in chart, report from night nurse and from assessment. Attending MD paged during phone call with daughter to call daughter and update her. Reassured daughter that I would have care team call and update her. Daughter agrees to this mornings procedure for her mother. 2nd RN Harrel Lemon, RN witnessed telephone consent for procedure.   0810   Consent signed. Patient leaving with Endoscopy RN's for procedure at this time.   10:27 Report called to me from ENDO.   10:30 Pt back in room resting comfortably, VSS. HR 63, o2 100% RA, BP 113/47. CT output 70 ml.  Lucius Conn, RN

## 2018-08-07 NOTE — Progress Notes (Addendum)
NAME:  Courtney [name], MRN:  [id number], DOB:  12/23/1940, LOS: 8 ADMISSION DATE:  07/30/2018, CONSULTATION DATE:  08/03/2018 REFERRING MD:  TRH, CHIEF COMPLAINT:  Hypotension/ GIB  Brief History   78 year old female admitted 6/6 with hypotension and Hgb of 5.7.  Over the course, she started having rectal bleeding requiring transfusions.  On 6/10, had ongoing bleeding becoming hypotensive requiring vasopressors, GI and IR evaluating. CT abd/pelvis showed no source of bleeding or hematoma, but small amount of gas in the endometrial canal of uncertain etiology with bilateral pleural effusions.  GYN was consulted and recommended no further workup.  Endoscopy was held off given her frailty and high risk.  She started having bloody stool overnight 6/7 and again transfused 1 unit PRBC 6/8. Bowel movements turned blackish/ brown with Hgb stable until she progressively became hypotensive 6/10 with ongoing bloody stools.  She was given given IVF, 1 unit PRBC and transferred to ICU for close monitoring.  Underwent RBC tagged study which was read as positive for GIB with proximal source either stomach or duodenum.  IR was consulted for possible arteriogram and embolization procedure however he thought there was very minimal bleeding to confidently identify bleeding source and recommended GI re-evaluate for either endoscopy or CTA abd/ pelvis.  While in ICU she loss her PIV access while on levophed and PCCM consulted to assist with further medical management and stabilization.  Left thoracentesis completed with iatrogenic pneumothorax s/p chest tube placement.   Past Medical History  ESRD - TTS HD, diastolic HF, stroke, CAD, DM, HTN, [facility] Events   6/06 Admitted  6/10 tx to ICU 6/13 Bleeding overnight  Consults:  GI Nephrology IR   Procedures:  ETT 6/10 >> 6/11  EGD 6/10   Significant Diagnostic Tests:  CT abd / pelvis 6/6 >> Small amount of gas in the endometrial canal. This is of  uncertain etiology and significance, but is abnormal, and could indicate endometritis with infection from gas-forming organisms, or could be related to recent procedure. Further clinical evaluation is recommended. No definite source for blood loss confidently identified in the abdomen or pelvis. Small volume of ascites. Large left and moderate right pleural effusions with extensive passive atelectasis in the lower lobes of the lungs bilaterally. Aortic atherosclerosis, in addition to left main and 3 vessel coronary artery disease. Assessment for potential risk factor modification, dietary therapy or pharmacologic therapy may be warranted, if clinically indicated. Additional incidental findings, as above.  RBC tagged study 6/10 >> Positive tagged red blood cell examination for gastrointestinal bleed, with a very proximal source, either the stomach or duodenum. 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  Micro Data:  SARS coronavirus 2 6/6 >> negative MRSA PCR 6/8 >> neg  Antimicrobials:    Interim history/subjective:  RN reports bloody stools overnight into the am. CT drained 400 ml overnight.   Objective   Blood pressure (!) 103/45, pulse 67, temperature 97.6 F (36.4 C), temperature source Oral, resp. rate 20, height 4\' 11"  (1.499 m), weight 31 kg, SpO2 100 %.        Intake/Output Summary (Last 24 hours) at 08/07/2018 0741 Last data filed at 08/07/2018 0600 Gross per 24 hour  Intake 2616.47 ml  Output 590 ml  Net 2026.47 ml   Filed Weights   08/05/18 0715 08/05/18 1050 08/07/18 0630  Weight: 31.8 kg 31.1 kg 31 kg   Examination: General: frail elderly female lying in bed in NAD  HEENT: MM pink/dry,  no jvd Neuro: Awakens, alert, states she is uncomfortable, moves all ext's  CV: s1s2 rrr, no m/r/g PULM: even/non-labored, lungs bilaterally clear  IH:KVQQ, non-tender, bsx4 active  Extremities: warm/dry, no edema  Skin: no rashes or lesions  Resolved Hospital Problem list    Assessment &  Plan:   Iatrogenic Pneumothorax P: Place CT to 20 cm suction  Follow up CXR in am   Acute Hypoxemic Respiratory Failure P: Wean O2 for sats 88-92% Will need ambulatory O2 assessment prior to discharge   COPD  -without acute exacerbation  P: Wean O2 as above  Continue bronchodilators  GIB  P: Per primary / GI   Labs   CBC: Recent Labs  Lab 08/03/18 2015  08/04/18 0610  08/05/18 0715 08/05/18 1323 08/05/18 2330 08/06/18 0836 08/06/18 2118 08/07/18 0318  WBC 10.8*  --  15.8*  --  12.4*  --   --  9.7  --  11.1*  HGB 7.1*   < > 8.9*   < > 7.2* 7.2* 7.7* 6.7* 11.7* 10.7*  HCT 21.4*   < > 25.7*   < > 21.6* 21.0* 23.0* 20.3* 33.4* 30.3*  MCV 91.5  --  89.9  --  93.1  --   --  96.7  --  83.2  PLT 71*  --  90*  --  80*  --   --  86*  --  89*   < > = values in this interval not displayed.    Basic Metabolic Panel: Recent Labs  Lab 08/03/18 0247  08/03/18 2248 08/04/18 0610 08/05/18 0445 08/06/18 0836 08/07/18 0318  NA 139   < > 136 137 141 136 136  K 3.5   < > 4.3 4.1 4.3 4.3 4.1  CL 103  --   --  103 108 102 104  CO2 25  --   --  20* 21* 22 24  GLUCOSE 95  --   --  150* 86 94 122*  BUN 46*  --   --  70* 78* 36* 51*  CREATININE 4.00*  --   --  4.93* 5.59* 3.13* 3.64*  CALCIUM 7.7*  --   --  7.7* 7.5* 7.5* 7.5*  MG  --   --   --   --  2.0 1.7  --   PHOS 4.2  --   --  4.2 4.7* 2.9  --    < > = values in this interval not displayed.   GFR: Estimated Creatinine Clearance: 6.4 mL/min (A) (by C-G formula based on SCr of 3.64 mg/dL (H)). Recent Labs  Lab 08/04/18 0610 08/05/18 0715 08/06/18 0836 08/07/18 0318  WBC 15.8* 12.4* 9.7 11.1*    Liver Function Tests: Recent Labs  Lab 08/01/18 0202 08/04/18 0610 08/05/18 1550  AST 36  --   --   ALT 81*  --   --   ALKPHOS 62  --   --   BILITOT 0.9  --   --   PROT 4.8*  --  4.7*  ALBUMIN 2.4* 2.4*  --    No results for input(s): LIPASE, AMYLASE in the last 168 hours. No results for input(s): AMMONIA in the  last 168 hours.  ABG    Component Value Date/Time   PHART 7.396 08/04/2018 1345   PCO2ART 33.6 08/04/2018 1345   PO2ART 182 (H) 08/04/2018 1345   HCO3 20.2 08/04/2018 1345   TCO2 23 08/03/2018 2248   ACIDBASEDEF 3.8 (H) 08/04/2018 1345   O2SAT 99.4  08/04/2018 1345     Coagulation Profile: Recent Labs  Lab 08/03/18 2015  INR 1.3*    Cardiac Enzymes: No results for input(s): CKTOTAL, CKMB, CKMBINDEX, TROPONINI in the last 168 hours.  HbA1C: Hgb A1c MFr Bld  Date/Time Value Ref Range Status  07/14/2017 04:32 AM 4.3 (L) 4.8 - 5.6 % Final    Comment:    (NOTE) Pre diabetes:          5.7%-6.4% Diabetes:              >6.4% Glycemic control for   <7.0% adults with diabetes   07/17/2012 03:50 PM 5.9 (H) <5.7 % Final    Comment:    (NOTE)                                                                       According to the ADA Clinical Practice Recommendations for 2011, when HbA1c is used as a screening test:  >=6.5%   Diagnostic of Diabetes Mellitus           (if abnormal result is confirmed) 5.7-6.4%   Increased risk of developing Diabetes Mellitus References:Diagnosis and Classification of Diabetes Mellitus,Diabetes QIHK,7425,95(GLOVF 1):S62-S69 and Standards of Medical Care in         Diabetes - 2011,Diabetes IEPP,2951,88 (Suppl 1):S11-S61.    CBG: Recent Labs  Lab 08/06/18 2030 08/06/18 2141 08/06/18 2329 08/07/18 0303 08/07/18 0705  GLUCAP 48* 85 46* 63* 42    Noe Gens, NP-C [facility] Pgr: [phone] or if no answer [phone] 08/07/2018, 8:21 AM  Attending Note:  78 year old female with extensive PMH who presents to PCCM with respiratory failure, pleural effusion, LGI bleeding and PCCM following for a PTX.  Overnight, PTX persists with suction.  On exam, lungs are clear with diminished BS diffusely.  I reviewed CXR myself, PTX noted with CT in place.  Discussed with PCCM-NP.  PTX:  - Maintain CT to suction today  - Repeat CX  in AM  - If not PTX then waterseal  Hypoxemia:  - Titrate O2 for sat of 88-92%  - May need an ambulatory desaturation study for home O2 prior to discharge if able to ambulate  GI Bleeding:  - H&H  - Colonoscopy today  - Per GI  PCCM will continue to follow for CT management.  Patient seen and examined, agree with above note.  I dictated the care and orders written for this patient under my direction.  Rush Farmer, [place]

## 2018-08-07 NOTE — Progress Notes (Signed)
Patient was once again seen and examined post endoscopy/colonoscopy, waking up in pain and abdominal discomfort.  PRN Dilaudid will be ordered, but blood pressure remains to be soft, Normal saline bolus to 250 ML will be given Stat CT abdomen pelvis without contrast ordered   Patient's daughter Ms. [name] Brissette was called at [phone] All current findings, plan of care was discussed with her daughter in detail. She is aware of all findings including this morning's endoscopy and colonoscopy.  Reports that she has had extensive discussion with gastroenterologist.  Current patient status was discussed.  Plan of care to pursue with CT scan of abdomen pelvis was discussed.  Also limitation of pain medication due to low blood pressure was also mentioned..  Patient's daughter aware of all findings, acknowledged understanding that her mother is currently under critical condition.   RN will continue to update patient's family as needed.  CODE STATUS were discussed-patient remains to be full code  Dr. Roger Shelter  [facility] hospitalist

## 2018-08-07 NOTE — Progress Notes (Signed)
Assisted tele visit to patient with daughter.  Nasirah Sachs M, RN  

## 2018-08-07 NOTE — Op Note (Signed)
Colonoscopy showed old blood in the entire colon, no active bleeding noted on lavage, few polyps noted but not removed. Terminal ileum showed old blood as well.  EGD was performed subsequently and showed small amount of blood in fundus, likely related to NG induced trauma, duodenal ulcer and visible vessel without active bleeding, likely site of prior cauterization. 1 endoclip was place and small bowel pillcam was deployed in the duodenum.  Start patient on protonix drip at 8mg /hr IV, monitor H and H and transfuse if Hb is less than 8. Discussed above with her daughter Kensi Karr, [phone].  Ronnette Juniper, MD

## 2018-08-08 ENCOUNTER — Inpatient Hospital Stay (HOSPITAL_COMMUNITY): Payer: Medicare Other

## 2018-08-08 DIAGNOSIS — K921 Melena: Secondary | ICD-10-CM

## 2018-08-08 DIAGNOSIS — E861 Hypovolemia: Secondary | ICD-10-CM

## 2018-08-08 DIAGNOSIS — I9589 Other hypotension: Secondary | ICD-10-CM

## 2018-08-08 LAB — BODY FLUID CULTURE
Culture: NO GROWTH
Gram Stain: NONE SEEN

## 2018-08-08 LAB — GLUCOSE, CAPILLARY
Glucose-Capillary: 106 mg/dL — ABNORMAL HIGH (ref 70–99)
Glucose-Capillary: 84 mg/dL (ref 70–99)
Glucose-Capillary: 71 mg/dL (ref 70–99)
Glucose-Capillary: 113 mg/dL — ABNORMAL HIGH (ref 70–99)
Glucose-Capillary: 130 mg/dL — ABNORMAL HIGH (ref 70–99)
Glucose-Capillary: 124 mg/dL — ABNORMAL HIGH (ref 70–99)

## 2018-08-08 LAB — BASIC METABOLIC PANEL
Anion gap: 9 (ref 5–15)
BUN: 57 mg/dL — ABNORMAL HIGH (ref 8–23)
CO2: 22 mmol/L (ref 22–32)
Calcium: 7.5 mg/dL — ABNORMAL LOW (ref 8.9–10.3)
Chloride: 103 mmol/L (ref 98–111)
Creatinine, Ser: 4.33 mg/dL — ABNORMAL HIGH (ref 0.44–1.00)
GFR calc Af Amer: 11 mL/min — ABNORMAL LOW (ref >60)
GFR calc non Af Amer: 9 mL/min — ABNORMAL LOW (ref >60)
Glucose, Bld: 109 mg/dL — ABNORMAL HIGH (ref 70–99)
Potassium: 4.1 mmol/L (ref 3.5–5.1)
Sodium: 134 mmol/L — ABNORMAL LOW (ref 135–145)

## 2018-08-08 LAB — CBC
HCT: 23.7 % — ABNORMAL LOW (ref 36.0–46.0)
Hemoglobin: 8.1 g/dL — ABNORMAL LOW (ref 12.0–15.0)
MCH: 29.7 pg (ref 26.0–34.0)
MCHC: 34.2 g/dL (ref 30.0–36.0)
MCV: 86.8 fL (ref 80.0–100.0)
Platelets: 115 10*3/uL — ABNORMAL LOW (ref 150–400)
RBC: 2.73 MIL/uL — ABNORMAL LOW (ref 3.87–5.11)
RDW: 18.3 % — ABNORMAL HIGH (ref 11.5–15.5)
WBC: 10.9 10*3/uL — ABNORMAL HIGH (ref 4.0–10.5)
nRBC: 0 % (ref 0.0–0.2)

## 2018-08-08 LAB — PROTEIN ELECTROPHORESIS, SERUM
A/G Ratio: 1.4 (ref 0.7–1.7)
Albumin ELP: 2.6 g/dL — ABNORMAL LOW (ref 2.9–4.4)
Alpha-1-Globulin: 0.3 g/dL (ref 0.0–0.4)
Alpha-2-Globulin: 0.4 g/dL (ref 0.4–1.0)
Beta Globulin: 0.4 g/dL — ABNORMAL LOW (ref 0.7–1.3)
Gamma Globulin: 0.8 g/dL (ref 0.4–1.8)
Globulin, Total: 1.9 g/dL — ABNORMAL LOW (ref 2.2–3.9)
M-Spike, %: 0.2 g/dL — ABNORMAL HIGH
Total Protein ELP: 4.5 g/dL — ABNORMAL LOW (ref 6.0–8.5)

## 2018-08-08 LAB — HEMOGLOBIN AND HEMATOCRIT, BLOOD
HCT: 22.5 % — ABNORMAL LOW (ref 36.0–46.0)
HCT: 23.1 % — ABNORMAL LOW (ref 36.0–46.0)
Hemoglobin: 7.5 g/dL — ABNORMAL LOW (ref 12.0–15.0)
Hemoglobin: 7.6 g/dL — ABNORMAL LOW (ref 12.0–15.0)

## 2018-08-08 LAB — PH, BODY FLUID: pH, Body Fluid: 7.9

## 2018-08-08 MED ORDER — DEXTROSE IN LACTATED RINGERS 5 % IV SOLN
INTRAVENOUS | Status: AC
  Administered 2018-08-08 (×2): via INTRAVENOUS

## 2018-08-08 MED ORDER — SODIUM CHLORIDE 0.9% FLUSH
10.0000 mL | Freq: Two times a day (BID) | INTRAVENOUS | Status: DC
  Administered 2018-08-08 – 2018-08-09 (×3): 10 mL
  Administered 2018-08-09: 30 mL
  Administered 2018-08-10: 10 mL
  Administered 2018-08-10: 20 mL
  Administered 2018-08-11 – 2018-08-18 (×7): 10 mL

## 2018-08-08 MED ORDER — SODIUM CHLORIDE 0.9% FLUSH: 10.0000 mL | INTRAVENOUS | Status: DC | PRN

## 2018-08-08 MED ORDER — NEPRO/CARBSTEADY PO LIQD
237.0000 mL | Freq: Two times a day (BID) | ORAL | Status: DC
  Administered 2018-08-08 – 2018-08-16 (×13): 237 mL via ORAL

## 2018-08-08 MED ORDER — ACETAMINOPHEN 10 MG/ML IV SOLN
1000.0000 mg | Freq: Four times a day (QID) | INTRAVENOUS | Status: AC | PRN
  Administered 2018-08-08 (×2): 1000 mg via INTRAVENOUS
  Filled 2018-08-08 (×2): qty 100

## 2018-08-08 MED ORDER — CHLORHEXIDINE GLUCONATE CLOTH 2 % EX PADS
6.0000 | MEDICATED_PAD | Freq: Every day | CUTANEOUS | Status: DC
  Administered 2018-08-08 – 2018-08-10 (×3): 6 via TOPICAL

## 2018-08-08 MED ORDER — DEXTROSE IN LACTATED RINGERS 5 % IV SOLN
INTRAVENOUS | Status: AC
  Administered 2018-08-08 (×2): via INTRAVENOUS

## 2018-08-08 NOTE — Progress Notes (Signed)
[place] Progress Note Patient Name: Courtney [name] DOB: [birth year] MRN: [id number]   Date of Service  08/08/2018  HPI/Events of Note  Patient with body aches and unable to take PO tylenol. Dilaudid dropped her BP and she becomes too somnolent.  eICU Interventions  IV acetaminophen prn ordered as requested     Intervention Category Intermediate Interventions: Pain - evaluation and management  Judd Lien 08/08/2018, 12:53 AM

## 2018-08-08 NOTE — Progress Notes (Signed)
Nutrition Follow-up  DOCUMENTATION CODES:   Severe malnutrition in context of chronic illness, Underweight  INTERVENTION:    Nepro Shake po BID, each supplement provides 425 kcal and 19 grams protein  NUTRITION DIAGNOSIS:   Severe Malnutrition related to chronic illness as evidenced by severe fat depletion, severe muscle depletion.  Ongoing  GOAL:   Patient will meet greater than or equal to 90% of their needs  Progressing  MONITOR:   Diet advancement, PO intake, Supplement acceptance, Labs, Weight trends  ASSESSMENT:   Courtney [name] is a 78 y.o. female with medical history significant of ESRD ON HD, TThsat, diastolic heart failure, stroke, CAD , DM, hypertension, hyperlipidemia presents to ED from HD for hypotension. Minimal history from the patient, her son at bedside and reports she has been lethargic, slightly confused and weak since yesterday. No bloody stools at home.   Patient was extubated on  6/11. Currently on room air. She developed hypotension and required Levophed initiation yesterday evening.   S/P colonoscopy today which showed no obvious active bleeding, just old blood throughout the colon.  Diet advanced to full liquids today. She only ate 5% of lunch tray today.  Labs reviewed. Sodium 134 (L), BUN 57 (H), creatinine 4.33 (H)  Medications reviewed and include Rena-vit, Pancrealipase, Levophed.    NUTRITION - FOCUSED PHYSICAL EXAM:    Most Recent Value  Orbital Region  Severe depletion  Upper Arm Region  Mild depletion  Thoracic and Lumbar Region  Severe depletion  Buccal Region  Severe depletion  Temple Region  Severe depletion  Clavicle Bone Region  Moderate depletion  Clavicle and Acromion Bone Region  Severe depletion  Scapular Bone Region  Unable to assess  Dorsal Hand  Severe depletion  Patellar Region  Severe depletion  Anterior Thigh Region  Severe depletion  Posterior Calf Region  Severe depletion  Edema (RD Assessment)  None  Hair   Reviewed  Eyes  Unable to assess  Mouth  Unable to assess  Skin  Reviewed  Nails  Reviewed       Diet Order:   Diet Order            Diet full liquid Room service appropriate? Yes; Fluid consistency: Thin  Diet effective now              EDUCATION NEEDS:   No education needs have been identified at this time  Skin:  Skin Assessment: Skin Integrity Issues: Skin Integrity Issues:: Stage I, Stage II, Other (Comment) Stage I: buttocks Stage II: sacrum Other: MASD to buttocks, perineum, sacrum  Last BM:  6/13  Height:   Ht Readings from Last 1 Encounters:  08/01/18 4\' 11"  (1.499 m)    Weight:   Wt Readings from Last 1 Encounters:  08/08/18 32.5 kg    Ideal Body Weight:  44.5 kg  BMI:  Body mass index is 14.47 kg/m.  Estimated Nutritional Needs:   Kcal:  1250-1500  Protein:  55-65 gm  Fluid:  1022ml + UOP    [name], RD, LDN, [place] Pager [phone] After Hours Pager [phone]

## 2018-08-08 NOTE — Progress Notes (Signed)
Subjective: Interval History: has no complaint .  Objective: Vital signs in last 24 hours: Temp:  [95.6 F (35.3 C)-98.1 F (36.7 C)] 97.3 F (36.3 C) (06/15 0700) Pulse Rate:  [35-75] 68 (06/15 0700) Resp:  [9-30] 15 (06/15 0700) BP: (71-146)/(27-90) 95/62 (06/15 0700) SpO2:  [25 %-100 %] 100 % (06/15 0700) Weight:  [31.8 kg] 31.8 kg (06/15 0700) Weight change: 0.8 kg  Intake/Output from previous day: 06/14 0701 - 06/15 0700 In: 2036.1 [I.V.:1195.5; IV Piggyback:840.6] Out: 340 [Emesis/NG output:70; Chest Tube:270] Intake/Output this shift: No intake/output data recorded.  General appearance: alert, cooperative, cachectic and no distress Resp: diminished breath sounds bilaterally and rales L base Cardio: S1, S2 normal and systolic murmur: systolic ejection 2/6, crescendo and decrescendo at 2nd left intercostal space GI: soft, liver down 4 cm Extremities: AVG RUA  Lab Results: Recent Labs    08/07/18 0318  08/07/18 2143 08/08/18 0430  WBC 11.1*  --   --  10.9*  HGB 10.7*   < > 10.3* 8.1*  HCT 30.3*   < > 30.9* 23.7*  PLT 89*  --   --  115*   < > = values in this interval not displayed.   BMET:  Recent Labs    08/07/18 0318 08/08/18 0430  NA 136 134*  K 4.1 4.1  CL 104 103  CO2 24 22  GLUCOSE 122* 109*  BUN 51* 57*  CREATININE 3.64* 4.33*  CALCIUM 7.5* 7.5*   No results for input(s): PTH in the last 72 hours. Iron Studies: No results for input(s): IRON, TIBC, TRANSFERRIN, FERRITIN in the last 72 hours.  Studies/Results: Ct Abdomen Pelvis Wo Contrast  Result Date: 08/07/2018 CLINICAL DATA:  Severe abdominal pain post EGD and colonoscopy, severe anemia due to active GI bleeding, history end-stage renal disease on dialysis, coronary artery disease post MI, diabetes mellitus, hypertension, CHF, stroke EXAM: CT ABDOMEN AND PELVIS WITHOUT CONTRAST TECHNIQUE: Multidetector CT imaging of the abdomen and pelvis was performed following the standard protocol without  IV contrast. Sagittal and coronal MPR images reconstructed from axial data set. Neither oral nor intravenous contrast were administered COMPARISON:  07/30/2018 FINDINGS: Lower chest: Bibasilar pleural effusions moderate RIGHT and small LEFT. LEFT basilar thoracostomy tube present with loculated pneumothorax at LEFT base. No mediastinal shift. Significant atelectasis of BILATERAL lower lobes. Hepatobiliary: Liver and gallbladder unremarkable Pancreas: Normal appearance Spleen: Normal appearance.  Small splenule inferior to spleen. Adrenals/Urinary Tract: Atrophic kidneys without gross mass or hydronephrosis. Adrenal glands unremarkable. Ureters not visualized. Bladder Delude appears mildly thickened though bladder contains minimal urine and is underdistended. Stomach/Bowel: Stomach and bowel loops poorly assessed due to lack of IV and oral contrast. Gastric Grossberg appears thickened though stomach is underdistended. No gross bowel dilatation or obvious Hubert thickening. Vascular/Lymphatic: Extensive atherosclerotic calcifications of both large and small vessels. Aorta normal caliber. No gross adenopathy. Enlargement of cardiac chambers. No significant pericardial effusion. Reproductive: Unremarkable uterus.  Nonvisualization of ovaries. Other: Diffuse soft tissue edema throughout body Dower and intra-abdominal/intrapelvic tissue planes. Scattered free fluid. No free air. No hernia. Musculoskeletal: Bones markedly demineralized. Orthopedic hardware proximal RIGHT femur. IMPRESSION: Extensive diffuse soft tissue edema and scattered ascites. Moderate RIGHT and small LEFT pleural effusions with bibasilar atelectasis. No definite acute intra-abdominal or intrapelvic abnormalities otherwise identified within limitations of exam. Loculated pneumothorax at LEFT lung base despite chest tube. Findings called to Dr.  Roger Shelter on 08/07/2018 at 1522 hours. Electronically Signed   By: Lavonia Dana M.D.   On: 08/07/2018  15:22   Dg Abd 1  View  Result Date: 08/06/2018 CLINICAL DATA:  Reason for exam: Encounter for nasogastric (NG) tube placement EXAM: ABDOMEN - 1 VIEW COMPARISON:  Radiograph 08/04/2018 FINDINGS: NG tube extends the stomach. No dilated large or small bowel. Extensive vascular calcification noted. IMPRESSION: NG tube in stomach. Electronically Signed   By: Suzy Bouchard M.D.   On: 08/06/2018 18:47   Nm Gi Blood Loss  Result Date: 08/06/2018 CLINICAL DATA:  GI bleed, anemia, hemoglobin 6.6 g EXAM: NUCLEAR MEDICINE GASTROINTESTINAL BLEEDING SCAN TECHNIQUE: Sequential abdominal images were obtained following intravenous administration of Tc-53m labeled red blood cells. RADIOPHARMACEUTICALS:  25.8 mCi Tc-87m pertechnetate in-vitro labeled red cells. COMPARISON:  None Correlation: CT abdomen pelvis 07/30/2018 FINDINGS: Imaging performed for 2 hours. Normal blood pool distribution of labeled red cells. No abnormal gastrointestinal localization of tracer identified. IMPRESSION: No scintigraphic evidence of active GI bleeding. Electronically Signed   By: Lavonia Dana M.D.   On: 08/06/2018 16:43   Dg Chest Port 1 View  Result Date: 08/07/2018 CLINICAL DATA:  78 y/o  F; central line placement. EXAM: PORTABLE CHEST 1 VIEW COMPARISON:  08/07/2018 chest radiograph. FINDINGS: Left central venous catheter tip projects over the lower SVC. Left-sided pleural drain is stable in position. Stable small left-sided pneumothorax at the lung base. Vascular stent projects over the right axilla. Stable hazy opacity at the right lung base probably representing effusion and atelectasis. Stable enlarged cardiac silhouette given projection and technique. IMPRESSION: Left central venous catheter tip projects over lower SVC. Stable small left-sided pneumothorax and pleural drain. Stable hazy opacity at right lung base probably representing effusion and atelectasis. Electronically Signed   By: Kristine Garbe M.D.   On: 08/07/2018 23:59   Dg  Chest Port 1 View  Result Date: 08/07/2018 CLINICAL DATA:  Follow-up left pneumothorax. End-stage renal disease on dialysis. Congestive heart failure. EXAM: PORTABLE CHEST 1 VIEW COMPARISON:  08/06/2018 FINDINGS: Left pleural pigtail catheter remains in place. Small left apex: Basilar pneumothorax shows no significant change in size. Left lower lung pleural-parenchymal scarring again noted. Cardiomegaly stable. Worsening asymmetric airspace disease is seen throughout the right lung, and increased size of small layering right pleural effusion is noted. IMPRESSION: 1. No significant change in small left pneumothorax. Left pleural pigtail catheter remains in place. 2. Worsening asymmetric airspace disease throughout right lung, and increased small layering right pleural effusion. 3. Stable cardiomegaly. Electronically Signed   By: Earle Gell M.D.   On: 08/07/2018 05:53   Dg Chest Port 1 View  Result Date: 08/06/2018 CLINICAL DATA:  Pneumothorax EXAM: PORTABLE CHEST 1 VIEW COMPARISON:  Portable exam 1239 hours compared to 0455 hours FINDINGS: Pigtail LEFT thoracostomy tube again seen. Small pneumothorax at LEFT apex. Question loculated pneumothorax versus skin folds at inferior LEFT chest. Improving aeration at LEFT lung base. Small RIGHT pleural effusion and basilar atelectasis with remaining RIGHT lung clear. Stable heart size mediastinal contours. IMPRESSION: Small LEFT apically and question lateral LEFT basilar pneumothorax despite thoracostomy tube. Bibasilar atelectasis, with improved aeration on LEFT. Electronically Signed   By: Lavonia Dana M.D.   On: 08/06/2018 14:42   Dg Abd Portable 1v  Result Date: 08/07/2018 CLINICAL DATA:  Nasogastric tube placement. EXAM: PORTABLE ABDOMEN - 1 VIEW COMPARISON:  08/06/2018 FINDINGS: Nasogastric tube is seen with tip overlying the distal body of the stomach. The bowel gas pattern is normal. Small right basilar pneumothorax is again seen with left pleural pigtail  catheter in place. IMPRESSION: Nasogastric tube  tip overlies the distal body of the stomach. Electronically Signed   By: Earle Gell M.D.   On: 08/07/2018 08:01    I have reviewed the patient's current medications.  Assessment/Plan: 1 ESRD for HD 2 GIB  Hb down, will support, ? AVM, DU 3 Anemia will use max esa, check Fe 4 HPTH  5 Emaciation 6 L pleural effusion ? etio 7 malnutrition 8 CVA 9 DM P Hd, esa, check Fe, support HB      LOS: 9 days   Matie Dimaano 08/08/2018,7:45 AM

## 2018-08-08 NOTE — Progress Notes (Signed)
Pt daughter called for update. This rn informed daughter pt remains drowsy, no recent stools, denies abdominal pain and remains on medication for blood pressure support

## 2018-08-08 NOTE — Progress Notes (Signed)
Pt daughter called for update. This rn informed daughter pt tolerated central line insertion well, pt remains on med for blood pressure support and hemoglobin level is stable. Pt currently awake and alert,pt daughter asking for exception and consideration to visit pt at bedside. Once again, virtual visit offered to pt daughter, pt daughter deferred at this time commenting she will connect and visit on today and reinforced her desire to visit pt at bedside. Informed pt daughter will inquire and make her request known to department leadership.

## 2018-08-08 NOTE — Progress Notes (Addendum)
NAME:  Courtney [name], MRN:  [id number], DOB:  Apr 08, 1940, LOS: 9 ADMISSION DATE:  07/30/2018, CONSULTATION DATE:  08/03/2018 REFERRING MD:  TRH, CHIEF COMPLAINT:  Hypotension/ GIB  Brief History   78 year old female admitted 6/6 with hypotension and Hgb of 5.7.  Over the course, she started having rectal bleeding requiring transfusions.  On 6/10, had ongoing bleeding becoming hypotensive requiring vasopressors, GI and IR evaluating. CT abd/pelvis showed no source of bleeding or hematoma, but small amount of gas in the endometrial canal of uncertain etiology with bilateral pleural effusions.  GYN was consulted and recommended no further workup.  Endoscopy was held off given her frailty and high risk.  She started having bloody stool overnight 6/7 and again transfused 1 unit PRBC 6/8. Bowel movements turned blackish/ brown with Hgb stable until she progressively became hypotensive 6/10 with ongoing bloody stools.  She was given given IVF, 1 unit PRBC and transferred to ICU for close monitoring.  Underwent RBC tagged study which was read as positive for GIB with proximal source either stomach or duodenum.  IR was consulted for possible arteriogram and embolization procedure however he thought there was very minimal bleeding to confidently identify bleeding source and recommended GI re-evaluate for either endoscopy or CTA abd/ pelvis.  While in ICU she loss her PIV access while on levophed and PCCM consulted to assist with further medical management and stabilization.  Left thoracentesis completed with iatrogenic pneumothorax s/p chest tube placement.   Past Medical History  ESRD - TTS HD, diastolic HF, stroke, CAD, DM, HTN, [place] [facility] Events   6/06 Admitted  6/10 tx to ICU 6/13 Bleeding overnight  Consults:  GI Nephrology IR   Procedures:  ETT 6/10 >> 6/11  EGD 6/10   Significant Diagnostic Tests:  CT abd / pelvis 6/6 >> Small amount of gas in the endometrial canal. This is of  uncertain etiology and significance, but is abnormal, and could indicate endometritis with infection from gas-forming organisms, or could be related to recent procedure. Further clinical evaluation is recommended. No definite source for blood loss confidently identified in the abdomen or pelvis. Small volume of ascites. Large left and moderate right pleural effusions with extensive passive atelectasis in the lower lobes of the lungs bilaterally. Aortic atherosclerosis, in addition to left main and 3 vessel coronary artery disease. Assessment for potential risk factor modification, dietary therapy or pharmacologic therapy may be warranted, if clinically indicated. Additional incidental findings, as above.  RBC tagged study 6/10 >> Positive tagged red blood cell examination for gastrointestinal bleed, with a very proximal source, either the stomach or duodenum. 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  Micro Data:  SARS coronavirus 2 6/6 >> negative MRSA PCR 6/8 >> neg  Antimicrobials:    Interim history/subjective:  S/p colonoscopy. Post-procedure developed hypotension thought to be related to sedating meds so patient was started on low-dose vasopressor  Objective   Blood pressure (!) 101/39, pulse 68, temperature (!) 97.3 F (36.3 C), temperature source Oral, resp. rate (!) 23, height 4\' 11"  (1.499 m), weight 31.8 kg, SpO2 100 %.        Intake/Output Summary (Last 24 hours) at 08/08/2018 1043 Last data filed at 08/08/2018 1000 Gross per 24 hour  Intake 2062.91 ml  Output 340 ml  Net 1722.91 ml   Filed Weights   08/05/18 1050 08/07/18 0630 08/08/18 0700  Weight: 31.1 kg 31 kg 31.8 kg   Physical Exam: General: Elderly-appearing, no acute distress  HENT: [place], AT, OP clear, dry mucous membranes Eyes: EOMI, no scleral icterus Respiratory: Clear to auscultation bilaterally.  No crackles, wheezing or rales Cardiovascular: RRR, -M/R/G, no JVD GI: BS+, soft, nontender  Extremities:-Edema,-tenderness Neuro: Awakens to voice, follows commands, moves extremities x 4  Resolved Hospital Problem list    Assessment & Plan:   Iatrogenic Pneumothorax P: CXR ordered CT to suction 20 cm If PTX improved, place on WS  Hypotension: Initially thought secondary to sedation. Now considering hypovolemic shock P: Wean levophed for MAP goal 65 500cc LR bolus over five hours   Acute Hypoxemic Respiratory Failure - currently on RA P: Wean O2 for sats 88-92% Will need ambulatory O2 assessment prior to discharge   GIB  P: Per primary / GI   Labs   CBC: Recent Labs  Lab 08/04/18 0610  08/05/18 0715  08/06/18 0836 08/06/18 2118 08/07/18 0318 08/07/18 0814 08/07/18 2143 08/08/18 0430  WBC 15.8*  --  12.4*  --  9.7  --  11.1*  --   --  10.9*  HGB 8.9*   < > 7.2*   < > 6.7* 11.7* 10.7* 9.3* 10.3* 8.1*  HCT 25.7*   < > 21.6*   < > 20.3* 33.4* 30.3* 27.6* 30.9* 23.7*  MCV 89.9  --  93.1  --  96.7  --  83.2  --   --  86.8  PLT 90*  --  80*  --  86*  --  89*  --   --  115*   < > = values in this interval not displayed.    Basic Metabolic Panel: Recent Labs  Lab 08/03/18 0247  08/04/18 0610 08/05/18 0445 08/06/18 0836 08/07/18 0318 08/08/18 0430  NA 139   < > 137 141 136 136 134*  K 3.5   < > 4.1 4.3 4.3 4.1 4.1  CL 103  --  103 108 102 104 103  CO2 25  --  20* 21* 22 24 22   GLUCOSE 95  --  150* 86 94 122* 109*  BUN 46*  --  70* 78* 36* 51* 57*  CREATININE 4.00*  --  4.93* 5.59* 3.13* 3.64* 4.33*  CALCIUM 7.7*  --  7.7* 7.5* 7.5* 7.5* 7.5*  MG  --   --   --  2.0 1.7  --   --   PHOS 4.2  --  4.2 4.7* 2.9  --   --    < > = values in this interval not displayed.   GFR: Estimated Creatinine Clearance: 5.5 mL/min (A) (by C-G formula based on SCr of 4.33 mg/dL (H)). Recent Labs  Lab 08/05/18 0715 08/06/18 0836 08/07/18 0318 08/08/18 0430  WBC 12.4* 9.7 11.1* 10.9*    Liver Function Tests: Recent Labs  Lab 08/04/18 0610 08/05/18 1550   PROT  --  4.7*  ALBUMIN 2.4*  --    No results for input(s): LIPASE, AMYLASE in the last 168 hours. No results for input(s): AMMONIA in the last 168 hours.  ABG    Component Value Date/Time   PHART 7.396 08/04/2018 1345   PCO2ART 33.6 08/04/2018 1345   PO2ART 182 (H) 08/04/2018 1345   HCO3 20.2 08/04/2018 1345   TCO2 23 08/03/2018 2248   ACIDBASEDEF 3.8 (H) 08/04/2018 1345   O2SAT 99.4 08/04/2018 1345     Coagulation Profile: Recent Labs  Lab 08/03/18 2015  INR 1.3*    Cardiac Enzymes: No results for input(s): CKTOTAL, CKMB, CKMBINDEX, TROPONINI in the last  168 hours.  HbA1C: Hgb A1c MFr Bld  Date/Time Value Ref Range Status  07/14/2017 04:32 AM 4.3 (L) 4.8 - 5.6 % Final    Comment:    (NOTE) Pre diabetes:          5.7%-6.4% Diabetes:              >6.4% Glycemic control for   <7.0% adults with diabetes   07/17/2012 03:50 PM 5.9 (H) <5.7 % Final    Comment:    (NOTE)                                                                       According to the ADA Clinical Practice Recommendations for 2011, when HbA1c is used as a screening test:  >=6.5%   Diagnostic of Diabetes Mellitus           (if abnormal result is confirmed) 5.7-6.4%   Increased risk of developing Diabetes Mellitus References:Diagnosis and Classification of Diabetes Mellitus,Diabetes SFKC,1275,17(GYFVC 1):S62-S69 and Standards of Medical Care in         Diabetes - 2011,Diabetes BSWH,6759,16 (Suppl 1):S11-S61.    CBG: Recent Labs  Lab 08/07/18 1525 08/07/18 1924 08/07/18 2356 08/08/18 0344 08/08/18 0712  GLUCAP 92 79 102* 106* 84   The patient is critically ill with multiple organ systems failure and requires high complexity decision making for assessment and support, frequent evaluation and titration of therapies, application of advanced monitoring technologies and extensive interpretation of multiple databases.   Critical Care Time devoted to patient care services described in this note  is 32 Minutes. This time reflects time of care of this signee Dr. Rodman Pickle. This critical care time does not reflect procedure time, or teaching time or supervisory time of PA/NP/Med student/Med Resident etc but could involve care discussion time.  Rodman Pickle, M.D. [facility] Pulmonary/Critical Care Medicine Pager: [phone] After hours pager: [phone]

## 2018-08-08 NOTE — Progress Notes (Signed)
This rn at bedside noted LEJ catheter not present upon IV site assessment, catheter intact under securely taped dressing. Micromedex checked for protonix and levophed drip compatibility, caution alert noted. Protonix drip LPIV stopped and levophed drip placed to [place]. Order for IV team consult placed. Sbp decreased to 80 with attempt to stop levophed drip. Made Dr Genevive Bi aware via elink of need for additional IV access and need to stop protonix drip stopped in order to maintain levophed drip. Md will send ground team to bedside to assess for access.

## 2018-08-08 NOTE — Procedures (Signed)
Small bowel PillCam report.  78 year old female with rectal bleeding, blood loss anemia and hypotension.  Colonoscopy showed dark old blood throughout the colon and in terminal ileum without obvious active bleeding on thorough lavage.  EGD a few days ago showed a bleeding duodenal AVM which was treated with bipolar cautery.  After performing an EGD which showed duodenal ulcer with a visible vessel treated with placement of an Endo Clip, the PillCam was deployed in the duodenal bulb. The Endo Clip was noted with a small amount of fresh blood.  As the capsule advanced into the proximal small bowel, an area of ulceration with pigmentation was noted, possibly site of prior cauterization.  The PillCam stayed at this area for almost 2-1/2 hours before advancing forward.  It was noted in the cecum at 5 hours and 11 minutes.  Although there was some dark old blood noted in mid and distal small bowel, there was no evidence of active bleeding.  Summary and recommendations: Endo Clip noted in proximal duodenum with small amount of fresh blood(likely site of bleeding as visible vessel was noted at this site on EGD and treated with Endo Clip placement).  Small ulceration with pigmentation noted distal to Endo Clip placement but still in proximal duodenum, likely site of prior cauterization of duodenal AVM.  Monitor H&H and transfuse PRBC if hemoglobin is less than 7. Protonix drip for another 24 hours at 8 mg/h, then transition to PPI twice daily for at least another 4 weeks, thereafter recommend PPI once a day indefinitely. Avoid NSAIDs or blood thinners.   Ronnette Juniper, MD

## 2018-08-09 ENCOUNTER — Inpatient Hospital Stay (HOSPITAL_COMMUNITY): Payer: Medicare Other

## 2018-08-09 DIAGNOSIS — Z992 Dependence on renal dialysis: Secondary | ICD-10-CM

## 2018-08-09 DIAGNOSIS — E43 Unspecified severe protein-calorie malnutrition: Secondary | ICD-10-CM | POA: Insufficient documentation

## 2018-08-09 DIAGNOSIS — N186 End stage renal disease: Secondary | ICD-10-CM

## 2018-08-09 LAB — GLUCOSE, CAPILLARY
Glucose-Capillary: 69 mg/dL — ABNORMAL LOW (ref 70–99)
Glucose-Capillary: 76 mg/dL (ref 70–99)
Glucose-Capillary: 80 mg/dL (ref 70–99)
Glucose-Capillary: 88 mg/dL (ref 70–99)
Glucose-Capillary: 92 mg/dL (ref 70–99)
Glucose-Capillary: 94 mg/dL (ref 70–99)

## 2018-08-09 LAB — RENAL FUNCTION PANEL
Albumin: 1.9 g/dL — ABNORMAL LOW (ref 3.5–5.0)
Anion gap: 7 (ref 5–15)
BUN: 20 mg/dL (ref 8–23)
CO2: 25 mmol/L (ref 22–32)
Calcium: 7.3 mg/dL — ABNORMAL LOW (ref 8.9–10.3)
Chloride: 103 mmol/L (ref 98–111)
Creatinine, Ser: 2.29 mg/dL — ABNORMAL HIGH (ref 0.44–1.00)
GFR calc Af Amer: 23 mL/min — ABNORMAL LOW (ref >60)
GFR calc non Af Amer: 20 mL/min — ABNORMAL LOW (ref >60)
Glucose, Bld: 109 mg/dL — ABNORMAL HIGH (ref 70–99)
Phosphorus: 2.2 mg/dL — ABNORMAL LOW (ref 2.5–4.6)
Potassium: 3.4 mmol/L — ABNORMAL LOW (ref 3.5–5.1)
Sodium: 135 mmol/L (ref 135–145)

## 2018-08-09 LAB — HEMOGLOBIN AND HEMATOCRIT, BLOOD
HCT: 22.9 % — ABNORMAL LOW (ref 36.0–46.0)
HCT: 21.8 % — ABNORMAL LOW (ref 36.0–46.0)
Hemoglobin: 7.4 g/dL — ABNORMAL LOW (ref 12.0–15.0)
Hemoglobin: 7 g/dL — ABNORMAL LOW (ref 12.0–15.0)

## 2018-08-09 LAB — CBC
HCT: 22.6 % — ABNORMAL LOW (ref 36.0–46.0)
Hemoglobin: 7.4 g/dL — ABNORMAL LOW (ref 12.0–15.0)
MCH: 29.5 pg (ref 26.0–34.0)
MCHC: 32.7 g/dL (ref 30.0–36.0)
MCV: 90 fL (ref 80.0–100.0)
Platelets: 109 10*3/uL — ABNORMAL LOW (ref 150–400)
RBC: 2.51 MIL/uL — ABNORMAL LOW (ref 3.87–5.11)
RDW: 18.3 % — ABNORMAL HIGH (ref 11.5–15.5)
WBC: 8.1 10*3/uL (ref 4.0–10.5)
nRBC: 0 % (ref 0.0–0.2)

## 2018-08-09 LAB — PTH, INTACT AND CALCIUM
Calcium, Total (PTH): 7 mg/dL — ABNORMAL LOW (ref 8.7–10.3)
PTH: 70 pg/mL — ABNORMAL HIGH (ref 15–65)

## 2018-08-09 LAB — CHOLESTEROL, BODY FLUID: Cholesterol, Fluid: 32 mg/dL

## 2018-08-09 MED ORDER — DARBEPOETIN ALFA 200 MCG/0.4ML IJ SOSY
200.0000 ug | PREFILLED_SYRINGE | INTRAMUSCULAR | Status: DC
  Administered 2018-08-13: 200 ug via INTRAVENOUS
  Filled 2018-08-09 (×2): qty 0.4

## 2018-08-09 MED ORDER — OXYCODONE HCL 5 MG PO TABS
5.0000 mg | ORAL_TABLET | ORAL | Status: DC | PRN
  Administered 2018-08-09 – 2018-08-17 (×7): 5 mg via ORAL
  Filled 2018-08-09 (×7): qty 1

## 2018-08-09 MED ORDER — K PHOS MONO-SOD PHOS DI & MONO 155-852-130 MG PO TABS
500.0000 mg | ORAL_TABLET | Freq: Two times a day (BID) | ORAL | Status: AC
  Administered 2018-08-09 (×2): 500 mg via ORAL
  Filled 2018-08-09 (×2): qty 2

## 2018-08-09 MED ORDER — CHLORHEXIDINE GLUCONATE CLOTH 2 % EX PADS
6.0000 | MEDICATED_PAD | Freq: Every day | CUTANEOUS | Status: DC
  Administered 2018-08-11: 15:00:00 6 via TOPICAL

## 2018-08-09 MED ORDER — MIDODRINE HCL 5 MG PO TABS
10.0000 mg | ORAL_TABLET | Freq: Three times a day (TID) | ORAL | Status: DC
  Administered 2018-08-09 – 2018-08-19 (×32): 10 mg via ORAL
  Filled 2018-08-09 (×32): qty 2

## 2018-08-09 NOTE — Progress Notes (Signed)
Subjective: Interval History: has no complaint.  But sleepy.  Objective: Vital signs in last 24 hours: Temp:  [97.4 F (36.3 C)-98 F (36.7 C)] 97.4 F (36.3 C) (06/16 0100) Pulse Rate:  [64-87] 68 (06/16 0615) Resp:  [10-35] 13 (06/16 0615) BP: (59-134)/(34-86) 124/47 (06/16 0615) SpO2:  [98 %-100 %] 100 % (06/16 0615) Weight:  [32.5 kg] 32.5 kg (06/15 1300) Weight change: 0.7 kg  Intake/Output from previous day: 06/15 0701 - 06/16 0700 In: 1895.4 [I.V.:1806; IV Piggyback:89.4] Out: 1244 [Chest Tube:300] Intake/Output this shift: No intake/output data recorded.  General appearance: cooperative, cachectic, pale and slowed mentation Resp: rhonchi LLL Chest Napp: L CT Cardio: S1, S2 normal and systolic murmur: systolic ejection 2/6, crescendo and decrescendo at 2nd left intercostal space GI: soft, non-tender; bowel sounds normal; no masses,  no organomegaly Extremities: AVF RUA  Lab Results: Recent Labs    08/08/18 0430  08/08/18 2148 08/09/18 0423  WBC 10.9*  --   --  8.1  HGB 8.1*   < > 7.6* 7.4*  HCT 23.7*   < > 23.1* 22.6*  PLT 115*  --   --  109*   < > = values in this interval not displayed.   BMET:  Recent Labs    08/08/18 0430 08/08/18 1325 08/09/18 0423  NA 134*  --  135  K 4.1  --  3.4*  CL 103  --  103  CO2 22  --  25  GLUCOSE 109*  --  109*  BUN 57*  --  20  CREATININE 4.33*  --  2.29*  CALCIUM 7.5* 7.0* 7.3*   Recent Labs    08/08/18 1325  PTH 70*  Comment   Iron Studies: No results for input(s): IRON, TIBC, TRANSFERRIN, FERRITIN in the last 72 hours.  Studies/Results: Ct Abdomen Pelvis Wo Contrast  Result Date: 08/07/2018 CLINICAL DATA:  Severe abdominal pain post EGD and colonoscopy, severe anemia due to active GI bleeding, history end-stage renal disease on dialysis, coronary artery disease post MI, diabetes mellitus, hypertension, CHF, stroke EXAM: CT ABDOMEN AND PELVIS WITHOUT CONTRAST TECHNIQUE: Multidetector CT imaging of the  abdomen and pelvis was performed following the standard protocol without IV contrast. Sagittal and coronal MPR images reconstructed from axial data set. Neither oral nor intravenous contrast were administered COMPARISON:  07/30/2018 FINDINGS: Lower chest: Bibasilar pleural effusions moderate RIGHT and small LEFT. LEFT basilar thoracostomy tube present with loculated pneumothorax at LEFT base. No mediastinal shift. Significant atelectasis of BILATERAL lower lobes. Hepatobiliary: Liver and gallbladder unremarkable Pancreas: Normal appearance Spleen: Normal appearance.  Small splenule inferior to spleen. Adrenals/Urinary Tract: Atrophic kidneys without gross mass or hydronephrosis. Adrenal glands unremarkable. Ureters not visualized. Bladder N[place] appears mildly thickened though bladder contains minimal urine and is underdistended. Stomach/Bowel: Stomach and bowel loops poorly assessed due to lack of IV and oral contrast. Gastric Kem appears thickened though stomach is underdistended. No gross bowel dilatation or obvious [place] thickening. Vascular/Lymphatic: Extensive atherosclerotic calcifications of both large and small vessels. Aorta normal caliber. No gross adenopathy. Enlargement of cardiac chambers. No significant pericardial effusion. Reproductive: Unremarkable uterus.  Nonvisualization of ovaries. Other: Diffuse soft tissue edema throughout body [place] and intra-abdominal/intrapelvic tissue planes. Scattered free fluid. No free air. No hernia. Musculoskeletal: Bones markedly demineralized. Orthopedic hardware proximal RIGHT femur. IMPRESSION: Extensive diffuse soft tissue edema and scattered ascites. Moderate RIGHT and small LEFT pleural effusions with bibasilar atelectasis. No definite acute intra-abdominal or intrapelvic abnormalities otherwise identified within limitations of exam. Loculated pneumothorax  at LEFT lung base despite chest tube. Findings called to Dr.  Roger Shelter on 08/07/2018 at 1522 hours.  Electronically Signed   By: Lavonia Dana M.D.   On: 08/07/2018 15:22   Dg Chest Port 1 View  Result Date: 08/08/2018 CLINICAL DATA:  Left-sided pneumothorax status post chest tube placement EXAM: PORTABLE CHEST 1 VIEW COMPARISON:  08/08/2018 FINDINGS: The left-sided chest tube and left-sided central venous catheter are both stable. There is a trace left apical pneumothorax. Bilateral pleural effusions are again noted which is stable from prior study. There is no acute osseous abnormality. A vascular stent is noted in the right axilla. The cardiac size is enlarged but stable from prior study. There is likely bibasilar atelectasis. IMPRESSION: 1. Stable lines and tubes. 2. Trace left apical pneumothorax. 3. Persistent bilateral pleural effusions, right greater than left. Electronically Signed   By: Constance Holster M.D.   On: 08/08/2018 19:21   Dg Chest Port 1 View  Result Date: 08/08/2018 CLINICAL DATA:  Left pneumothorax. EXAM: PORTABLE CHEST 1 VIEW COMPARISON:  08/07/2018 and 08/06/2018 FINDINGS: There is no visible residual left pneumothorax. There has been reaccumulation of the left base effusion. Right base effusion persist. Left chest tube and central venous catheter appear unchanged in position. Heart size and pulmonary vascularity are normal. Extensive aortic atherosclerosis. Diffuse osteopenia. IMPRESSION: 1. Resolution of the left base pneumothorax. 2. Reaccumulation of left base pleural effusion. 3. Persistent right pleural effusion. 4.  Aortic Atherosclerosis (ICD10-I70.0). Electronically Signed   By: Lorriane Shire M.D.   On: 08/08/2018 13:14   Dg Chest Port 1 View  Result Date: 08/07/2018 CLINICAL DATA:  78 y/o  F; central line placement. EXAM: PORTABLE CHEST 1 VIEW COMPARISON:  08/07/2018 chest radiograph. FINDINGS: Left central venous catheter tip projects over the lower SVC. Left-sided pleural drain is stable in position. Stable small left-sided pneumothorax at the lung base. Vascular  stent projects over the right axilla. Stable hazy opacity at the right lung base probably representing effusion and atelectasis. Stable enlarged cardiac silhouette given projection and technique. IMPRESSION: Left central venous catheter tip projects over lower SVC. Stable small left-sided pneumothorax and pleural drain. Stable hazy opacity at right lung base probably representing effusion and atelectasis. Electronically Signed   By: Kristine Garbe M.D.   On: 08/07/2018 23:59   Dg Abd Portable 1v  Result Date: 08/07/2018 CLINICAL DATA:  Nasogastric tube placement. EXAM: PORTABLE ABDOMEN - 1 VIEW COMPARISON:  08/06/2018 FINDINGS: Nasogastric tube is seen with tip overlying the distal body of the stomach. The bowel gas pattern is normal. Small right basilar pneumothorax is again seen with left pleural pigtail catheter in place. IMPRESSION: Nasogastric tube tip overlies the distal body of the stomach. Electronically Signed   By: Earle Gell M.D.   On: 08/07/2018 08:01    I have reviewed the patient's current medications.  Assessment/Plan: 1 ESRD for HD MWF, vol ok.  K low 2 Anemia going down, follow 3 GIB AVM, DU.  4 HPTH 5 Malnutrition. 6 DM 7 CVA P Follow Hb, esa, HD tomorrow, K phos.     LOS: 10 days   [name] Mikhaela Zaugg 08/09/2018,7:04 AM

## 2018-08-09 NOTE — Progress Notes (Signed)
Repeat CXR with small apical PTX unchanged after clamping chest tube.  Clamp removed and no air leak even with coughing.  Chest tube removed and occlusive dressing applied. No immediate complications.  Erskine Emery MD

## 2018-08-09 NOTE — Progress Notes (Signed)
Subjective: Denies abdominal pain. As per her nurse she had 2 bowel movements described as brown without blood in it or black stools.  Objective: Vital signs in last 24 hours: Temp:  [97.4 F (36.3 C)-98.3 F (36.8 C)] 98.3 F (36.8 C) (06/16 0700) Pulse Rate:  [64-87] 66 (06/16 0800) Resp:  [10-35] 13 (06/16 0800) BP: (85-134)/(34-86) 107/43 (06/16 0800) SpO2:  [98 %-100 %] 100 % (06/16 0800) Weight:  [32.5 kg] 32.5 kg (06/15 1300) Weight change: 0.7 kg Last BM Date: 08/08/18  PE: Sleepy, but cooperative GENERAL: Chronically ill-appearing, frail, pale ABDOMEN: Soft, nondistended, nontender EXTREMITIES: Cachectic  Lab Results: Results for orders placed or performed during the hospital encounter of 07/30/18 (from the past 48 hour(s))  Glucose, capillary     Status: None   Collection Time: 08/07/18 11:09 AM  Result Value Ref Range   Glucose-Capillary 76 70 - 99 mg/dL  Glucose, capillary     Status: None   Collection Time: 08/07/18  3:25 PM  Result Value Ref Range   Glucose-Capillary 92 70 - 99 mg/dL  Glucose, capillary     Status: None   Collection Time: 08/07/18  7:24 PM  Result Value Ref Range   Glucose-Capillary 79 70 - 99 mg/dL  Hemoglobin and hematocrit, blood     Status: Abnormal   Collection Time: 08/07/18  9:43 PM  Result Value Ref Range   Hemoglobin 10.3 (L) 12.0 - 15.0 g/dL   HCT 30.9 (L) 36.0 - 46.0 %    Comment: Performed at [facility], [address] [place], [place] [address]  Type and screen Keep 2 units ahead     Status: None (Preliminary result)   Collection Time: 08/07/18  9:43 PM  Result Value Ref Range   ABO/RH(D) A POS    Antibody Screen NEG    Sample Expiration      08/10/2018,2359 Performed at [facility], [place] [address] [place], [place] [address]    Unit Number [id number]    Blood Component Type RED CELLS,LR    Unit division 00    Status of Unit ALLOCATED    Transfusion Status OK TO TRANSFUSE    Crossmatch Result  Compatible    Unit Number [id number]    Blood Component Type RED CELLS,LR    Unit division 00    Status of Unit ALLOCATED    Transfusion Status OK TO TRANSFUSE    Crossmatch Result Compatible   Glucose, capillary     Status: Abnormal   Collection Time: 08/07/18 11:56 PM  Result Value Ref Range   Glucose-Capillary 102 (H) 70 - 99 mg/dL   Comment 1 Notify RN   Glucose, capillary     Status: Abnormal   Collection Time: 08/08/18  3:44 AM  Result Value Ref Range   Glucose-Capillary 106 (H) 70 - 99 mg/dL  CBC     Status: Abnormal   Collection Time: 08/08/18  4:30 AM  Result Value Ref Range   WBC 10.9 (H) 4.0 - 10.5 K/uL   RBC 2.73 (L) 3.87 - 5.11 MIL/uL   Hemoglobin 8.1 (L) 12.0 - 15.0 g/dL    Comment: REPEATED TO VERIFY   HCT 23.7 (L) 36.0 - 46.0 %   MCV 86.8 80.0 - 100.0 fL   MCH 29.7 26.0 - 34.0 pg   MCHC 34.2 30.0 - 36.0 g/dL   RDW 18.3 (H) 11.5 - 15.5 %   Platelets 115 (L) 150 - 400 K/uL    Comment: REPEATED  TO VERIFY Immature Platelet Fraction may be clinically indicated, consider ordering this additional test [id number] CONSISTENT WITH PREVIOUS RESULT    nRBC 0.0 0.0 - 0.2 %    Comment: Performed at [facility], [place] [address], [place], [place] [address]  Basic metabolic panel     Status: Abnormal   Collection Time: 08/08/18  4:30 AM  Result Value Ref Range   Sodium 134 (L) 135 - 145 mmol/L   Potassium 4.1 3.5 - 5.1 mmol/L   Chloride 103 98 - 111 mmol/L   CO2 22 22 - 32 mmol/L   Glucose, Bld 109 (H) 70 - 99 mg/dL   BUN 57 (H) 8 - 23 mg/dL   Creatinine, Ser 4.33 (H) 0.44 - 1.00 mg/dL   Calcium 7.5 (L) 8.9 - 10.3 mg/dL   GFR calc non Af Amer 9 (L) >60 mL/min   GFR calc Af Amer 11 (L) >60 mL/min   Anion gap 9 5 - 15    Comment: Performed at [facility] [address], [place], [place] [address]  Glucose, capillary     Status: None   Collection Time: 08/08/18  7:12 AM  Result Value Ref Range   Glucose-Capillary 84 70 - 99 mg/dL  Glucose, capillary      Status: None   Collection Time: 08/08/18 10:59 AM  Result Value Ref Range   Glucose-Capillary 71 70 - 99 mg/dL  Hemoglobin and hematocrit, blood     Status: Abnormal   Collection Time: 08/08/18 11:08 AM  Result Value Ref Range   Hemoglobin 7.5 (L) 12.0 - 15.0 g/dL   HCT 22.5 (L) 36.0 - 46.0 %    Comment: Performed at [facility], [place] [address], [place],  [address]  PTH, intact and calcium     Status: Abnormal   Collection Time: 08/08/18  1:25 PM  Result Value Ref Range   PTH 70 (H) 15 - 65 pg/mL   Calcium, Total (PTH) 7.0 (L) 8.7 - 10.3 mg/dL   PTH Interp Comment     Comment: (NOTE) Interpretation                 Intact PTH    Calcium                                (pg/mL)      (mg/dL) Normal                          15 - 65     8.6 - 10.2 Primary Hyperparathyroidism         >65          >10.2 Secondary Hyperparathyroidism       >65          <10.2 Non-Parathyroid Hypercalcemia       <65          >10.2 Hypoparathyroidism                  <15          < 8.6 Non-Parathyroid Hypocalcemia    15 - 65          < 8.6 Performed At: [facility] [address] [place], [place] [id number] Rush Farmer MD [id number]   Glucose, capillary     Status: Abnormal   Collection Time: 08/08/18  3:01 PM  Result Value Ref  Range   Glucose-Capillary 113 (H) 70 - 99 mg/dL  Glucose, capillary     Status: Abnormal   Collection Time: 08/08/18  7:25 PM  Result Value Ref Range   Glucose-Capillary 130 (H) 70 - 99 mg/dL  Hemoglobin and hematocrit, blood     Status: Abnormal   Collection Time: 08/08/18  9:48 PM  Result Value Ref Range   Hemoglobin 7.6 (L) 12.0 - 15.0 g/dL   HCT 23.1 (L) 36.0 - 46.0 %    Comment: Performed at [facility] [address] [place], [place] [address]  Glucose, capillary     Status: Abnormal   Collection Time: 08/08/18 11:13 PM  Result Value Ref Range   Glucose-Capillary 124 (H) 70 - 99 mg/dL  Glucose, capillary     Status: None    Collection Time: 08/09/18  3:58 AM  Result Value Ref Range   Glucose-Capillary 94 70 - 99 mg/dL  CBC     Status: Abnormal   Collection Time: 08/09/18  4:23 AM  Result Value Ref Range   WBC 8.1 4.0 - 10.5 K/uL   RBC 2.51 (L) 3.87 - 5.11 MIL/uL   Hemoglobin 7.4 (L) 12.0 - 15.0 g/dL   HCT 22.6 (L) 36.0 - 46.0 %   MCV 90.0 80.0 - 100.0 fL   MCH 29.5 26.0 - 34.0 pg   MCHC 32.7 30.0 - 36.0 g/dL   RDW 18.3 (H) 11.5 - 15.5 %   Platelets 109 (L) 150 - 400 K/uL    Comment: REPEATED TO VERIFY Immature Platelet Fraction may be clinically indicated, consider ordering this additional test [id number] CONSISTENT WITH PREVIOUS RESULT    nRBC 0.0 0.0 - 0.2 %    Comment: Performed at [facility], [place] [address] [place], [place] [address]  Renal function panel     Status: Abnormal   Collection Time: 08/09/18  4:23 AM  Result Value Ref Range   Sodium 135 135 - 145 mmol/L   Potassium 3.4 (L) 3.5 - 5.1 mmol/L    Comment: DELTA CHECK NOTED   Chloride 103 98 - 111 mmol/L   CO2 25 22 - 32 mmol/L   Glucose, Bld 109 (H) 70 - 99 mg/dL   BUN 20 8 - 23 mg/dL   Creatinine, Ser 2.29 (H) 0.44 - 1.00 mg/dL    Comment: DELTA CHECK NOTED   Calcium 7.3 (L) 8.9 - 10.3 mg/dL   Phosphorus 2.2 (L) 2.5 - 4.6 mg/dL   Albumin 1.9 (L) 3.5 - 5.0 g/dL   GFR calc non Af Amer 20 (L) >60 mL/min   GFR calc Af Amer 23 (L) >60 mL/min   Anion gap 7 5 - 15    Comment: Performed at [facility] [address] [place], [place] [address]  Glucose, capillary     Status: None   Collection Time: 08/09/18  7:09 AM  Result Value Ref Range   Glucose-Capillary 92 70 - 99 mg/dL  Hemoglobin and hematocrit, blood     Status: Abnormal   Collection Time: 08/09/18  7:40 AM  Result Value Ref Range   Hemoglobin 7.4 (L) 12.0 - 15.0 g/dL   HCT 22.9 (L) 36.0 - 46.0 %    Comment: Performed at [facility] [address] [place], [place] [address]    Studies/Results: Ct Abdomen Pelvis Wo Contrast  Result Date:  08/07/2018 CLINICAL DATA:  Severe abdominal pain post EGD and colonoscopy, severe anemia due to active GI bleeding,  history end-stage renal disease on dialysis, coronary artery disease post MI, diabetes mellitus, hypertension, CHF, stroke EXAM: CT ABDOMEN AND PELVIS WITHOUT CONTRAST TECHNIQUE: Multidetector CT imaging of the abdomen and pelvis was performed following the standard protocol without IV contrast. Sagittal and coronal MPR images reconstructed from axial data set. Neither oral nor intravenous contrast were administered COMPARISON:  07/30/2018 FINDINGS: Lower chest: Bibasilar pleural effusions moderate RIGHT and small LEFT. LEFT basilar thoracostomy tube present with loculated pneumothorax at LEFT base. No mediastinal shift. Significant atelectasis of BILATERAL lower lobes. Hepatobiliary: Liver and gallbladder unremarkable Pancreas: Normal appearance Spleen: Normal appearance.  Small splenule inferior to spleen. Adrenals/Urinary Tract: Atrophic kidneys without gross mass or hydronephrosis. Adrenal glands unremarkable. Ureters not visualized. Bladder [place] appears mildly thickened though bladder contains minimal urine and is underdistended. Stomach/Bowel: Stomach and bowel loops poorly assessed due to lack of IV and oral contrast. Gastric Erlandson appears thickened though stomach is underdistended. No gross bowel dilatation or obvious Helman thickening. Vascular/Lymphatic: Extensive atherosclerotic calcifications of both large and small vessels. Aorta normal caliber. No gross adenopathy. Enlargement of cardiac chambers. No significant pericardial effusion. Reproductive: Unremarkable uterus.  Nonvisualization of ovaries. Other: Diffuse soft tissue edema throughout body [place] and intra-abdominal/intrapelvic tissue planes. Scattered free fluid. No free air. No hernia. Musculoskeletal: Bones markedly demineralized. Orthopedic hardware proximal RIGHT femur. IMPRESSION: Extensive diffuse soft tissue edema and scattered  ascites. Moderate RIGHT and small LEFT pleural effusions with bibasilar atelectasis. No definite acute intra-abdominal or intrapelvic abnormalities otherwise identified within limitations of exam. Loculated pneumothorax at LEFT lung base despite chest tube. Findings called to Dr.  Roger Shelter on 08/07/2018 at 1522 hours. Electronically Signed   By: Lavonia Dana M.D.   On: 08/07/2018 15:22   Dg Chest Port 1 View  Result Date: 08/08/2018 CLINICAL DATA:  Left-sided pneumothorax status post chest tube placement EXAM: PORTABLE CHEST 1 VIEW COMPARISON:  08/08/2018 FINDINGS: The left-sided chest tube and left-sided central venous catheter are both stable. There is a trace left apical pneumothorax. Bilateral pleural effusions are again noted which is stable from prior study. There is no acute osseous abnormality. A vascular stent is noted in the right axilla. The cardiac size is enlarged but stable from prior study. There is likely bibasilar atelectasis. IMPRESSION: 1. Stable lines and tubes. 2. Trace left apical pneumothorax. 3. Persistent bilateral pleural effusions, right greater than left. Electronically Signed   By: Constance Holster M.D.   On: 08/08/2018 19:21   Dg Chest Port 1 View  Result Date: 08/08/2018 CLINICAL DATA:  Left pneumothorax. EXAM: PORTABLE CHEST 1 VIEW COMPARISON:  08/07/2018 and 08/06/2018 FINDINGS: There is no visible residual left pneumothorax. There has been reaccumulation of the left base effusion. Right base effusion persist. Left chest tube and central venous catheter appear unchanged in position. Heart size and pulmonary vascularity are normal. Extensive aortic atherosclerosis. Diffuse osteopenia. IMPRESSION: 1. Resolution of the left base pneumothorax. 2. Reaccumulation of left base pleural effusion. 3. Persistent right pleural effusion. 4.  Aortic Atherosclerosis (ICD10-I70.0). Electronically Signed   By: Lorriane Shire M.D.   On: 08/08/2018 13:14   Dg Chest Port 1 View  Result  Date: 08/07/2018 CLINICAL DATA:  78 y/o  F; central line placement. EXAM: PORTABLE CHEST 1 VIEW COMPARISON:  08/07/2018 chest radiograph. FINDINGS: Left central venous catheter tip projects over the lower SVC. Left-sided pleural drain is stable in position. Stable small left-sided pneumothorax at the lung base. Vascular stent projects over the right axilla. Stable hazy opacity at the right  lung base probably representing effusion and atelectasis. Stable enlarged cardiac silhouette given projection and technique. IMPRESSION: Left central venous catheter tip projects over lower SVC. Stable small left-sided pneumothorax and pleural drain. Stable hazy opacity at right lung base probably representing effusion and atelectasis. Electronically Signed   By: Kristine Garbe M.D.   On: 08/07/2018 23:59    Medications: I have reviewed the patient's current medications.  Assessment: GI bleeding-after EGD, small bowel PillCam and colonoscopy evaluation, bleeding thought to be originating in duodenum, Endo Clip placed at site of visible vessel, no further black stools/rectal bleeding noted since Hemoglobin stable at 7.4/7.4 Has not required blood transfusion since 08/06/2018  End-stage renal renal disease on hemodialysis Hypotension, continued on IV Levophed Multiple comorbidities including diastolic heart failure, history of stroke, coronary artery disease, diabetes  Plan: Start renal diet Okay to DC IV Protonix drip after current bag is over, recommend PPI twice daily at least for another 4 weeks, thereafter once a day indefinitely. Avoid NSAIDs and anticoagulation   Ronnette Juniper 08/09/2018, 8:31 AM   Pager [phone] If no answer or after 5 PM call [phone]

## 2018-08-09 NOTE — Progress Notes (Addendum)
Patient had two stools, small and smear. No evidence of bleeding at this time. Hgb has remained stable. Patient intermittently complaining of abd pain. Gave PRNs to alleviate. No distention at this time. Will continue to monitor.

## 2018-08-09 NOTE — Progress Notes (Addendum)
NAME:  Courtney [name], MRN:  [id number], DOB:  1940-05-10, LOS: 77 ADMISSION DATE:  07/30/2018, CONSULTATION DATE:  08/03/2018 REFERRING MD:  TRH, CHIEF COMPLAINT:  Hypotension/ GIB  Brief History   78 year old female admitted 6/6 with hypotension and Hgb of 5.7.  Over the course, she started having rectal bleeding requiring transfusions.  On 6/10, had ongoing bleeding becoming hypotensive requiring vasopressors, GI and IR evaluating. CT abd/pelvis showed no source of bleeding or hematoma, but small amount of gas in the endometrial canal of uncertain etiology with bilateral pleural effusions.  GYN was consulted and recommended no further workup.  Endoscopy was held off given her frailty and high risk.  She started having bloody stool overnight 6/7 and again transfused 1 unit PRBC 6/8. Bowel movements turned blackish/ brown with Hgb stable until she progressively became hypotensive 6/10 with ongoing bloody stools.  She was given given IVF, 1 unit PRBC and transferred to ICU for close monitoring.  Underwent RBC tagged study which was read as positive for GIB with proximal source either stomach or duodenum.  IR was consulted for possible arteriogram and embolization procedure however he thought there was very minimal bleeding to confidently identify bleeding source and recommended GI re-evaluate for either endoscopy or CTA abd/ pelvis.  While in ICU she loss her PIV access while on levophed and PCCM consulted to assist with further medical management and stabilization.  Left thoracentesis completed with iatrogenic pneumothorax s/p chest tube placement.   Past Medical History  ESRD - TTS HD, diastolic HF, stroke, CAD, DM, HTN, [facility] Events   6/06 Admitted  6/10 tx to ICU 6/13 Bleeding overnight  Consults:  GI Nephrology IR   Procedures:  ETT 6/10 >> 6/11  EGD 6/10  08/05/2018 left thoracentesis 08/05/2018 left chest tube for iatrogenic pneumothorax>> 08/07/2018 left IJ CVL>>   Significant Diagnostic Tests:  CT abd / pelvis 6/6 >> Small amount of gas in the endometrial canal. This is of uncertain etiology and significance, but is abnormal, and could indicate endometritis with infection from gas-forming organisms, or could be related to recent procedure. Further clinical evaluation is recommended. No definite source for blood loss confidently identified in the abdomen or pelvis. Small volume of ascites. Large left and moderate right pleural effusions with extensive passive atelectasis in the lower lobes of the lungs bilaterally. Aortic atherosclerosis, in addition to left main and 3 vessel coronary artery disease. Assessment for potential risk factor modification, dietary therapy or pharmacologic therapy may be warranted, if clinically indicated. Additional incidental findings, as above.  RBC tagged study 6/10 >> Positive tagged red blood cell examination for gastrointestinal bleed, with a very proximal source, either the stomach or duodenum. 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  Micro Data:  SARS coronavirus 2 6/6 >> negative MRSA PCR 6/8 >> neg  Antimicrobials:    Interim history/subjective:  Currently on Levophed drip.  Awake alert and readjust the parameters to wean Levophed off Chest tube to waterseal.  No chest x-ray for today.  X-ray 08/08/2018 reveals small apical pneumothorax despite chest tube.  Recheck chest x-ray 08/09/1998  Objective   Blood pressure (!) 107/43, pulse 66, temperature 98.3 F (36.8 C), temperature source Oral, resp. rate 13, height 4\' 11"  (1.499 m), weight 32.5 kg, SpO2 100 %.        Intake/Output Summary (Last 24 hours) at 08/09/2018 0831 Last data filed at 08/09/2018 0600 Gross per 24 hour  Intake 1850.59 ml  Output 1244 ml  Net 606.59 ml   Filed Weights   08/07/18 0630 08/08/18 0700 08/08/18 1300  Weight: 31 kg 31.8 kg 32.5 kg   Physical Exam: General: Frail elderly female no acute distress at rest HEENT: No JVD lymphadenopathy  is appreciated Neuro: Awake follows commands questionable orientation CV: Heart sounds are regular PULM: Decreased breath sounds bilaterally.  Left chest tube to waterseal without air leak HG:DJME, non-tender, bsx4 active  Extremities: warm/dry, negative edema  Skin: no rashes or lesions   Resolved Hospital Problem list    Assessment & Plan:   Iatrogenic Pneumothorax P:  Check chest x-ray Chest tube currently to waterseal No small residual left upper pneumothorax despite chest tube Consideration to resuming suction will check chest x-ray today 08/09/2018 determine if pneumothorax remains Questionable small pneumothorax.  We will clamp chest tube which was done at 1030 hrs. on 08/09/2022 serial chest x-rays if pneumothorax remains or increases will unclamp chest tube.  If no pneumothorax will move chest tube.  Hypotension: Initially thought secondary to sedation. Now considering hypovolemic shock P: Change goals for Levophed for mean arterial pressure of 60 and/or systolic blood pressure of 88, note she has chronic hypotension. 08/09/2018 add midodrine 10 tid   Acute Hypoxemic Respiratory Failure - currently on RA P:  Currently on room air Maintain O2 saturations greater than 88%  GIB  P:  Monitor being followed by GI services and primary  Labs   CBC: Recent Labs  Lab 08/05/18 0715  08/06/18 0836  08/07/18 0318  08/08/18 0430 08/08/18 1108 08/08/18 2148 08/09/18 0423 08/09/18 0740  WBC 12.4*  --  9.7  --  11.1*  --  10.9*  --   --  8.1  --   HGB 7.2*   < > 6.7*   < > 10.7*   < > 8.1* 7.5* 7.6* 7.4* 7.4*  HCT 21.6*   < > 20.3*   < > 30.3*   < > 23.7* 22.5* 23.1* 22.6* 22.9*  MCV 93.1  --  96.7  --  83.2  --  86.8  --   --  90.0  --   PLT 80*  --  86*  --  89*  --  115*  --   --  109*  --    < > = values in this interval not displayed.    Basic Metabolic Panel: Recent Labs  Lab 08/03/18 0247  08/04/18 0610 08/05/18 0445 08/06/18 0836 08/07/18 0318 08/08/18  0430 08/08/18 1325 08/09/18 0423  NA 139   < > 137 141 136 136 134*  --  135  K 3.5   < > 4.1 4.3 4.3 4.1 4.1  --  3.4*  CL 103  --  103 108 102 104 103  --  103  CO2 25  --  20* 21* 22 24 22   --  25  GLUCOSE 95  --  150* 86 94 122* 109*  --  109*  BUN 46*  --  70* 78* 36* 51* 57*  --  20  CREATININE 4.00*  --  4.93* 5.59* 3.13* 3.64* 4.33*  --  2.29*  CALCIUM 7.7*  --  7.7* 7.5* 7.5* 7.5* 7.5* 7.0* 7.3*  MG  --   --   --  2.0 1.7  --   --   --   --   PHOS 4.2  --  4.2 4.7* 2.9  --   --   --  2.2*   < > = values in this  interval not displayed.   GFR: Estimated Creatinine Clearance: 10.7 mL/min (A) (by C-G formula based on SCr of 2.29 mg/dL (H)). Recent Labs  Lab 08/06/18 0836 08/07/18 0318 08/08/18 0430 08/09/18 0423  WBC 9.7 11.1* 10.9* 8.1    Liver Function Tests: Recent Labs  Lab 08/04/18 0610 08/05/18 1550 08/09/18 0423  PROT  --  4.7*  --   ALBUMIN 2.4*  --  1.9*   No results for input(s): LIPASE, AMYLASE in the last 168 hours. No results for input(s): AMMONIA in the last 168 hours.  ABG    Component Value Date/Time   PHART 7.396 08/04/2018 1345   PCO2ART 33.6 08/04/2018 1345   PO2ART 182 (H) 08/04/2018 1345   HCO3 20.2 08/04/2018 1345   TCO2 23 08/03/2018 2248   ACIDBASEDEF 3.8 (H) 08/04/2018 1345   O2SAT 99.4 08/04/2018 1345     Coagulation Profile: Recent Labs  Lab 08/03/18 2015  INR 1.3*    Cardiac Enzymes: No results for input(s): CKTOTAL, CKMB, CKMBINDEX, TROPONINI in the last 168 hours.  HbA1C: Hgb A1c MFr Bld  Date/Time Value Ref Range Status  07/14/2017 04:32 AM 4.3 (L) 4.8 - 5.6 % Final    Comment:    (NOTE) Pre diabetes:          5.7%-6.4% Diabetes:              >6.4% Glycemic control for   <7.0% adults with diabetes   07/17/2012 03:50 PM 5.9 (H) <5.7 % Final    Comment:    (NOTE)                                                                       According to the ADA Clinical Practice Recommendations for 2011, when HbA1c  is used as a screening test:  >=6.5%   Diagnostic of Diabetes Mellitus           (if abnormal result is confirmed) 5.7-6.4%   Increased risk of developing Diabetes Mellitus References:Diagnosis and Classification of Diabetes Mellitus,Diabetes STMH,9622,29(NLGXQ 1):S62-S69 and Standards of Medical Care in         Diabetes - 2011,Diabetes JJHE,1740,81 (Suppl 1):S11-S61.    CBG: Recent Labs  Lab 08/08/18 1501 08/08/18 1925 08/08/18 2313 08/09/18 0358 08/09/18 0709  GLUCAP 113* 130* 124* 94 92   App cct 30 min   Richardson Landry  ACNP [facility] Pager [phone] till 1 pm If no answer page [phone] 08/09/2018, 8:31 AM

## 2018-08-10 ENCOUNTER — Inpatient Hospital Stay (HOSPITAL_COMMUNITY): Payer: Medicare Other

## 2018-08-10 DIAGNOSIS — E162 Hypoglycemia, unspecified: Secondary | ICD-10-CM

## 2018-08-10 DIAGNOSIS — R54 Age-related physical debility: Secondary | ICD-10-CM

## 2018-08-10 LAB — RENAL FUNCTION PANEL
Albumin: 1.4 g/dL — ABNORMAL LOW (ref 3.5–5.0)
Anion gap: 5 (ref 5–15)
BUN: 23 mg/dL (ref 8–23)
CO2: 24 mmol/L (ref 22–32)
Calcium: 6.8 mg/dL — ABNORMAL LOW (ref 8.9–10.3)
Chloride: 107 mmol/L (ref 98–111)
Creatinine, Ser: 3.1 mg/dL — ABNORMAL HIGH (ref 0.44–1.00)
GFR calc Af Amer: 16 mL/min — ABNORMAL LOW (ref >60)
GFR calc non Af Amer: 14 mL/min — ABNORMAL LOW (ref >60)
Glucose, Bld: 383 mg/dL — ABNORMAL HIGH (ref 70–99)
Phosphorus: 4.1 mg/dL (ref 2.5–4.6)
Potassium: 3.3 mmol/L — ABNORMAL LOW (ref 3.5–5.1)
Sodium: 136 mmol/L (ref 135–145)

## 2018-08-10 LAB — GLUCOSE, CAPILLARY
Glucose-Capillary: 60 mg/dL — ABNORMAL LOW (ref 70–99)
Glucose-Capillary: 166 mg/dL — ABNORMAL HIGH (ref 70–99)
Glucose-Capillary: 74 mg/dL (ref 70–99)
Glucose-Capillary: 59 mg/dL — ABNORMAL LOW (ref 70–99)
Glucose-Capillary: 163 mg/dL — ABNORMAL HIGH (ref 70–99)
Glucose-Capillary: 74 mg/dL (ref 70–99)
Glucose-Capillary: 82 mg/dL (ref 70–99)
Glucose-Capillary: 62 mg/dL — ABNORMAL LOW (ref 70–99)
Glucose-Capillary: 81 mg/dL (ref 70–99)

## 2018-08-10 LAB — CBC
HCT: 20 % — ABNORMAL LOW (ref 36.0–46.0)
Hemoglobin: 6.4 g/dL — CL (ref 12.0–15.0)
MCH: 30 pg (ref 26.0–34.0)
MCHC: 32 g/dL (ref 30.0–36.0)
MCV: 93.9 fL (ref 80.0–100.0)
Platelets: 76 10*3/uL — ABNORMAL LOW (ref 150–400)
RBC: 2.13 MIL/uL — ABNORMAL LOW (ref 3.87–5.11)
RDW: 19.1 % — ABNORMAL HIGH (ref 11.5–15.5)
WBC: 6.3 10*3/uL (ref 4.0–10.5)
nRBC: 0 % (ref 0.0–0.2)

## 2018-08-10 LAB — HEMOGLOBIN AND HEMATOCRIT, BLOOD
HCT: 31.1 % — ABNORMAL LOW (ref 36.0–46.0)
HCT: 29.3 % — ABNORMAL LOW (ref 36.0–46.0)
HCT: 35 % — ABNORMAL LOW (ref 36.0–46.0)
Hemoglobin: 10 g/dL — ABNORMAL LOW (ref 12.0–15.0)
Hemoglobin: 9.6 g/dL — ABNORMAL LOW (ref 12.0–15.0)
Hemoglobin: 11.5 g/dL — ABNORMAL LOW (ref 12.0–15.0)

## 2018-08-10 LAB — PREPARE RBC (CROSSMATCH)

## 2018-08-10 LAB — MAGNESIUM: Magnesium: 1.5 mg/dL — ABNORMAL LOW (ref 1.7–2.4)

## 2018-08-10 MED ORDER — DEXTROSE 50 % IV SOLN
INTRAVENOUS | Status: AC
  Administered 2018-08-10: 50 mL
  Filled 2018-08-10: qty 50

## 2018-08-10 MED ORDER — HEPARIN SODIUM (PORCINE) 1000 UNIT/ML DIALYSIS
1000.0000 [IU] | INTRAMUSCULAR | Status: DC | PRN
  Filled 2018-08-10: qty 1

## 2018-08-10 MED ORDER — MAGNESIUM SULFATE 2 GM/50ML IV SOLN
2.0000 g | Freq: Once | INTRAVENOUS | Status: AC
  Administered 2018-08-10: 2 g via INTRAVENOUS
  Filled 2018-08-10: qty 50

## 2018-08-10 MED ORDER — SODIUM CHLORIDE 0.9 % IV SOLN: 100.0000 mL | INTRAVENOUS | Status: DC | PRN

## 2018-08-10 MED ORDER — LIDOCAINE HCL (PF) 1 % IJ SOLN
5.0000 mL | INTRAMUSCULAR | Status: DC | PRN
  Filled 2018-08-10 (×2): qty 5

## 2018-08-10 MED ORDER — SODIUM CHLORIDE 0.9% IV SOLUTION
Freq: Once | INTRAVENOUS | Status: AC
  Administered 2018-08-10: 05:00:00 via INTRAVENOUS

## 2018-08-10 MED ORDER — PENTAFLUOROPROP-TETRAFLUOROETH EX AERO: 1.0000 "application " | INHALATION_SPRAY | CUTANEOUS | Status: DC | PRN

## 2018-08-10 MED ORDER — POTASSIUM CHLORIDE CRYS ER 20 MEQ PO TBCR
20.0000 meq | EXTENDED_RELEASE_TABLET | Freq: Once | ORAL | Status: AC
  Administered 2018-08-10: 07:00:00 20 meq via ORAL
  Filled 2018-08-10: qty 1

## 2018-08-10 MED ORDER — LIDOCAINE-PRILOCAINE 2.5-2.5 % EX CREA
1.0000 "application " | TOPICAL_CREAM | CUTANEOUS | Status: DC | PRN
  Filled 2018-08-10: qty 5

## 2018-08-10 NOTE — Progress Notes (Addendum)
NAME:  Courtney [name], MRN:  [id number], DOB:  1940-06-06, LOS: 11 ADMISSION DATE:  07/30/2018, CONSULTATION DATE:  08/03/2018 REFERRING MD:  TRH, CHIEF COMPLAINT:  Hypotension/ GIB  Brief History   78 year old female admitted 6/6 with hypotension and Hgb of 5.7.  Over the course, she started having rectal bleeding requiring transfusions.  On 6/10, had ongoing bleeding becoming hypotensive requiring vasopressors, GI and IR evaluating. CT abd/pelvis showed no source of bleeding or hematoma, but small amount of gas in the endometrial canal of uncertain etiology with bilateral pleural effusions.  GYN was consulted and recommended no further workup.  Endoscopy was held off given her frailty and high risk.  She started having bloody stool overnight 6/7 and again transfused 1 unit PRBC 6/8. Bowel movements turned blackish/ brown with Hgb stable until she progressively became hypotensive 6/10 with ongoing bloody stools.  She was given given IVF, 1 unit PRBC and transferred to ICU for close monitoring.  Underwent RBC tagged study which was read as positive for GIB with proximal source either stomach or duodenum.  IR was consulted for possible arteriogram and embolization procedure however he thought there was very minimal bleeding to confidently identify bleeding source and recommended GI re-evaluate for either endoscopy or CTA abd/ pelvis.  While in ICU she loss her PIV access while on levophed and PCCM consulted to assist with further medical management and stabilization.  Left thoracentesis completed with iatrogenic pneumothorax s/p chest tube placement.   Past Medical History  ESRD - TTS HD, diastolic HF, stroke, CAD, DM, HTN, [facility] Events   6/06 Admitted  6/10 tx to ICU 6/13 Bleeding overnight  Consults:  GI Nephrology IR   Procedures:  ETT 6/10 >> 6/11  EGD 6/10  08/05/2018 left thoracentesis 08/05/2018 left chest tube for iatrogenic pneumothorax>> 08/09/2018 08/07/2018 left IJ  CVL>>  Significant Diagnostic Tests:  CT abd / pelvis 6/6 >> Small amount of gas in the endometrial canal. This is of uncertain etiology and significance, but is abnormal, and could indicate endometritis with infection from gas-forming organisms, or could be related to recent procedure. Further clinical evaluation is recommended. No definite source for blood loss confidently identified in the abdomen or pelvis. Small volume of ascites. Large left and moderate right pleural effusions with extensive passive atelectasis in the lower lobes of the lungs bilaterally. Aortic atherosclerosis, in addition to left main and 3 vessel coronary artery disease. Assessment for potential risk factor modification, dietary therapy or pharmacologic therapy may be warranted, if clinically indicated. Additional incidental findings, as above.  RBC tagged study 6/10 >> Positive tagged red blood cell examination for gastrointestinal bleed, with a very proximal source, either the stomach or duodenum. 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  Micro Data:  SARS coronavirus 2 6/6 >> negative MRSA PCR 6/8 >> neg  Antimicrobials:    Interim history/subjective:  Chest tube was removed 08/09/2018  Back on Levophed receiving unit of packed cells 0  Objective   Blood pressure (!) 115/39, pulse (!) 58, temperature (!) 97.2 F (36.2 C), temperature source Axillary, resp. rate 13, height 4\' 11"  (1.499 m), weight 32.5 kg, SpO2 100 %.        Intake/Output Summary (Last 24 hours) at 08/10/2018 0810 Last data filed at 08/10/2018 0600 Gross per 24 hour  Intake 659.94 ml  Output -  Net 659.94 ml   Filed Weights   08/07/18 0630 08/08/18 0700 08/08/18 1300  Weight: 31 kg 31.8 kg 32.5  kg   Physical Exam: General: Frail elderly female follows commands HEENT: No JVD or lymphadenopathy is appreciated.  Left IJ CVL is in place Neuro: Moves all extremities follows commands mild confusion CV: s1s2 rrr, no m/r/g PULM: even/non-labored,  lungs bilaterally diminished in bases PO:EUMP, non-tender, bsx4 active, no overt signs of GI bleeding noted Extremities: warm/dry, negative edema  Skin: no rashes or lesions   Resolved Hospital Problem list    Assessment & Plan:   Iatrogenic Pneumothorax P:  Chest tube is been removed Pleural effusion has returned No further chest x-ray is needed at this time  Hypotension: Initially thought secondary to sedation. Now considering hypovolemic shock P: Goals been changed from Levophed Midodrine added 08/09/2018 Transfuse for hemoglobin 6.7  Acute Hypoxemic Respiratory Failure - currently on RA P:  Successful liberated from mechanical Tory support Maintaining adequate O2 sats on room air  GIB  Anemia P:  Continue to monitor is being followed by GI services and by primary Check iron package Noted to be get EPO per renal services  Labs   CBC: Recent Labs  Lab 08/06/18 0836  08/07/18 0318  08/08/18 0430  08/08/18 2148 08/09/18 0423 08/09/18 0740 08/09/18 2018 08/10/18 0401  WBC 9.7  --  11.1*  --  10.9*  --   --  8.1  --   --  6.3  HGB 6.7*   < > 10.7*   < > 8.1*   < > 7.6* 7.4* 7.4* 7.0* 6.4*  HCT 20.3*   < > 30.3*   < > 23.7*   < > 23.1* 22.6* 22.9* 21.8* 20.0*  MCV 96.7  --  83.2  --  86.8  --   --  90.0  --   --  93.9  PLT 86*  --  89*  --  115*  --   --  109*  --   --  76*   < > = values in this interval not displayed.    Basic Metabolic Panel: Recent Labs  Lab 08/04/18 0610 08/05/18 0445 08/06/18 0836 08/07/18 0318 08/08/18 0430 08/08/18 1325 08/09/18 0423 08/10/18 0401  NA 137 141 136 136 134*  --  135 136  K 4.1 4.3 4.3 4.1 4.1  --  3.4* 3.3*  CL 103 108 102 104 103  --  103 107  CO2 20* 21* 22 24 22   --  25 24  GLUCOSE 150* 86 94 122* 109*  --  109* 383*  BUN 70* 78* 36* 51* 57*  --  20 23  CREATININE 4.93* 5.59* 3.13* 3.64* 4.33*  --  2.29* 3.10*  CALCIUM 7.7* 7.5* 7.5* 7.5* 7.5* 7.0* 7.3* 6.8*  MG  --  2.0 1.7  --   --   --   --  1.5*  PHOS  4.2 4.7* 2.9  --   --   --  2.2* 4.1   GFR: Estimated Creatinine Clearance: 7.8 mL/min (A) (by C-G formula based on SCr of 3.1 mg/dL (H)). Recent Labs  Lab 08/07/18 0318 08/08/18 0430 08/09/18 0423 08/10/18 0401  WBC 11.1* 10.9* 8.1 6.3    Liver Function Tests: Recent Labs  Lab 08/04/18 0610 08/05/18 1550 08/09/18 0423 08/10/18 0401  PROT  --  4.7*  --   --   ALBUMIN 2.4*  --  1.9* 1.4*   No results for input(s): LIPASE, AMYLASE in the last 168 hours. No results for input(s): AMMONIA in the last 168 hours.  ABG    Component Value  Date/Time   PHART 7.396 08/04/2018 1345   PCO2ART 33.6 08/04/2018 1345   PO2ART 182 (H) 08/04/2018 1345   HCO3 20.2 08/04/2018 1345   TCO2 23 08/03/2018 2248   ACIDBASEDEF 3.8 (H) 08/04/2018 1345   O2SAT 99.4 08/04/2018 1345     Coagulation Profile: Recent Labs  Lab 08/03/18 2015  INR 1.3*    Cardiac Enzymes: No results for input(s): CKTOTAL, CKMB, CKMBINDEX, TROPONINI in the last 168 hours.  HbA1C: Hgb A1c MFr Bld  Date/Time Value Ref Range Status  07/14/2017 04:32 AM 4.3 (L) 4.8 - 5.6 % Final    Comment:    (NOTE) Pre diabetes:          5.7%-6.4% Diabetes:              >6.4% Glycemic control for   <7.0% adults with diabetes   07/17/2012 03:50 PM 5.9 (H) <5.7 % Final    Comment:    (NOTE)                                                                       According to the ADA Clinical Practice Recommendations for 2011, when HbA1c is used as a screening test:  >=6.5%   Diagnostic of Diabetes Mellitus           (if abnormal result is confirmed) 5.7-6.4%   Increased risk of developing Diabetes Mellitus References:Diagnosis and Classification of Diabetes Mellitus,Diabetes PJKD,3267,12(WPYKD 1):S62-S69 and Standards of Medical Care in         Diabetes - 2011,Diabetes XIPJ,8250,53 (Suppl 1):S11-S61.    CBG: Recent Labs  Lab 08/09/18 1921 08/09/18 2333 08/10/18 0350 08/10/18 0446 08/10/18 0712  GLUCAP 88 76 60*  166* 74  08/10/2018 daughter updated on patient's condition. App cct 30 min   Richardson Landry Henleigh Robello ACNP [facility] Pager [phone] till 1 pm If no answer page [phone] 08/10/2018, 8:10 AM

## 2018-08-10 NOTE — Progress Notes (Signed)
Subjective: No rectal bleeding or black stools. 1 brown mushy bowel movement in the last 24 hours. Tolerating renal diet. Was off pressors yesterday, started on IV Levophed today morning.  Objective: Vital signs in last 24 hours: Temp:  [97.2 F (36.2 C)-97.7 F (36.5 C)] 97.6 F (36.4 C) (06/17 0900) Pulse Rate:  [54-68] 58 (06/17 0900) Resp:  [10-25] 16 (06/17 0900) BP: (85-115)/(30-84) 113/38 (06/17 0900) SpO2:  [95 %-100 %] 100 % (06/17 0900) Weight change:  Last BM Date: 08/08/18  PE: Chronically ill-appearing, frail GENERAL: Mild pallor ABDOMEN: Soft, nondistended, nontender EXTREMITIES: Cachectic  Lab Results: Results for orders placed or performed during the hospital encounter of 07/30/18 (from the past 48 hour(s))  Glucose, capillary     Status: None   Collection Time: 08/08/18 10:59 AM  Result Value Ref Range   Glucose-Capillary 71 70 - 99 mg/dL  Hemoglobin and hematocrit, blood     Status: Abnormal   Collection Time: 08/08/18 11:08 AM  Result Value Ref Range   Hemoglobin 7.5 (L) 12.0 - 15.0 g/dL   HCT 22.5 (L) 36.0 - 46.0 %    Comment: Performed at [facility], [address], [place], [place] [address]  PTH, intact and calcium     Status: Abnormal   Collection Time: 08/08/18  1:25 PM  Result Value Ref Range   PTH 70 (H) 15 - 65 pg/mL   Calcium, Total (PTH) 7.0 (L) 8.7 - 10.3 mg/dL   PTH Interp Comment     Comment: (NOTE) Interpretation                 Intact PTH    Calcium                                (pg/mL)      (mg/dL) Normal                          15 - 65     8.6 - 10.2 Primary Hyperparathyroidism         >65          >10.2 Secondary Hyperparathyroidism       >65          <10.2 Non-Parathyroid Hypercalcemia       <65          >10.2 Hypoparathyroidism                  <15          < 8.6 Non-Parathyroid Hypocalcemia    15 - 65          < 8.6 Performed At: [facility] [place], [place] [id number] Rush Farmer MD  MP:[id number]   Glucose, capillary     Status: Abnormal   Collection Time: 08/08/18  3:01 PM  Result Value Ref Range   Glucose-Capillary 113 (H) 70 - 99 mg/dL  Glucose, capillary     Status: Abnormal   Collection Time: 08/08/18  7:25 PM  Result Value Ref Range   Glucose-Capillary 130 (H) 70 - 99 mg/dL  Hemoglobin and hematocrit, blood     Status: Abnormal   Collection Time: 08/08/18  9:48 PM  Result Value Ref Range   Hemoglobin 7.6 (L) 12.0 - 15.0 g/dL   HCT 23.1 (L) 36.0 - 46.0 %    Comment: Performed at [facility], [address] [place], [place] [address]  Glucose, capillary     Status: Abnormal   Collection Time: 08/08/18 11:13 PM  Result Value Ref Range   Glucose-Capillary 124 (H) 70 - 99 mg/dL  Glucose, capillary     Status: None   Collection Time: 08/09/18  3:58 AM  Result Value Ref Range   Glucose-Capillary 94 70 - 99 mg/dL  CBC     Status: Abnormal   Collection Time: 08/09/18  4:23 AM  Result Value Ref Range   WBC 8.1 4.0 - 10.5 K/uL   RBC 2.51 (L) 3.87 - 5.11 MIL/uL   Hemoglobin 7.4 (L) 12.0 - 15.0 g/dL   HCT 22.6 (L) 36.0 - 46.0 %   MCV 90.0 80.0 - 100.0 fL   MCH 29.5 26.0 - 34.0 pg   MCHC 32.7 30.0 - 36.0 g/dL   RDW 18.3 (H) 11.5 - 15.5 %   Platelets 109 (L) 150 - 400 K/uL    Comment: REPEATED TO VERIFY Immature Platelet Fraction may be clinically indicated, consider ordering this additional test [id number] CONSISTENT WITH PREVIOUS RESULT    nRBC 0.0 0.0 - 0.2 %    Comment: Performed at [facility], [place] [address] [place], [place] [address]  Renal function panel     Status: Abnormal   Collection Time: 08/09/18  4:23 AM  Result Value Ref Range   Sodium 135 135 - 145 mmol/L   Potassium 3.4 (L) 3.5 - 5.1 mmol/L    Comment: DELTA CHECK NOTED   Chloride 103 98 - 111 mmol/L   CO2 25 22 - 32 mmol/L   Glucose, Bld 109 (H) 70 - 99 mg/dL   BUN 20 8 - 23 mg/dL   Creatinine, Ser 2.29 (H) 0.44 - 1.00 mg/dL    Comment: DELTA CHECK NOTED   Calcium  7.3 (L) 8.9 - 10.3 mg/dL   Phosphorus 2.2 (L) 2.5 - 4.6 mg/dL   Albumin 1.9 (L) 3.5 - 5.0 g/dL   GFR calc non Af Amer 20 (L) >60 mL/min   GFR calc Af Amer 23 (L) >60 mL/min   Anion gap 7 5 - 15    Comment: Performed at [facility] [address] [place] [address], [place], [place] [address]  Glucose, capillary     Status: None   Collection Time: 08/09/18  7:09 AM  Result Value Ref Range   Glucose-Capillary 92 70 - 99 mg/dL  Hemoglobin and hematocrit, blood     Status: Abnormal   Collection Time: 08/09/18  7:40 AM  Result Value Ref Range   Hemoglobin 7.4 (L) 12.0 - 15.0 g/dL   HCT 22.9 (L) 36.0 - 46.0 %    Comment: Performed at [facility] [address] [place], [place] [address]  Glucose, capillary     Status: Abnormal   Collection Time: 08/09/18 11:02 AM  Result Value Ref Range   Glucose-Capillary 69 (L) 70 - 99 mg/dL  Glucose, capillary     Status: None   Collection Time: 08/09/18  3:28 PM  Result Value Ref Range   Glucose-Capillary 80 70 - 99 mg/dL  Glucose, capillary     Status: None   Collection Time: 08/09/18  7:21 PM  Result Value Ref Range   Glucose-Capillary 88 70 - 99 mg/dL  Hemoglobin and hematocrit, blood     Status: Abnormal   Collection Time: 08/09/18  8:18 PM  Result Value Ref Range   Hemoglobin 7.0 (L) 12.0 - 15.0 g/dL  HCT 21.8 (L) 36.0 - 46.0 %    Comment: Performed at [facility], [place] [address], [place], [place] [address]  Glucose, capillary     Status: None   Collection Time: 08/09/18 11:33 PM  Result Value Ref Range   Glucose-Capillary 76 70 - 99 mg/dL  Glucose, capillary     Status: Abnormal   Collection Time: 08/10/18  3:50 AM  Result Value Ref Range   Glucose-Capillary 60 (L) 70 - 99 mg/dL  CBC     Status: Abnormal   Collection Time: 08/10/18  4:01 AM  Result Value Ref Range   WBC 6.3 4.0 - 10.5 K/uL   RBC 2.13 (L) 3.87 - 5.11 MIL/uL   Hemoglobin 6.4 (LL) 12.0 - 15.0 g/dL    Comment: REPEATED TO VERIFY THIS CRITICAL RESULT HAS  VERIFIED AND BEEN CALLED TO RN PHILLIPS K. BY MESSAN HOUEGNIFIO ON 06 17 2020 AT 0442, AND HAS BEEN READ BACK.     HCT 20.0 (L) 36.0 - 46.0 %   MCV 93.9 80.0 - 100.0 fL   MCH 30.0 26.0 - 34.0 pg   MCHC 32.0 30.0 - 36.0 g/dL   RDW 19.1 (H) 11.5 - 15.5 %   Platelets 76 (L) 150 - 400 K/uL    Comment: REPEATED TO VERIFY SPECIMEN CHECKED FOR CLOTS Immature Platelet Fraction may be clinically indicated, consider ordering this additional test [id number] CONSISTENT WITH PREVIOUS RESULT    nRBC 0.0 0.0 - 0.2 %    Comment: Performed at [facility], [place] [address], [place], [place] [address]  Renal function panel     Status: Abnormal   Collection Time: 08/10/18  4:01 AM  Result Value Ref Range   Sodium 136 135 - 145 mmol/L   Potassium 3.3 (L) 3.5 - 5.1 mmol/L   Chloride 107 98 - 111 mmol/L   CO2 24 22 - 32 mmol/L   Glucose, Bld 383 (H) 70 - 99 mg/dL   BUN 23 8 - 23 mg/dL   Creatinine, Ser 3.10 (H) 0.44 - 1.00 mg/dL   Calcium 6.8 (L) 8.9 - 10.3 mg/dL   Phosphorus 4.1 2.5 - 4.6 mg/dL   Albumin 1.4 (L) 3.5 - 5.0 g/dL   GFR calc non Af Amer 14 (L) >60 mL/min   GFR calc Af Amer 16 (L) >60 mL/min   Anion gap 5 5 - 15    Comment: Performed at [facility] [address] [place], [place] [address]  Magnesium     Status: Abnormal   Collection Time: 08/10/18  4:01 AM  Result Value Ref Range   Magnesium 1.5 (L) 1.7 - 2.4 mg/dL    Comment: Performed at [facility] [address] [place], [place] [address]  Glucose, capillary     Status: Abnormal   Collection Time: 08/10/18  4:46 AM  Result Value Ref Range   Glucose-Capillary 166 (H) 70 - 99 mg/dL   Comment 1 Notify RN    Comment 2 Document in Chart   Prepare RBC     Status: None   Collection Time: 08/10/18  4:53 AM  Result Value Ref Range   Order Confirmation      ORDER PROCESSED BY BLOOD BANK BB SAMPLE OR UNITS ALREADY AVAILABLE Performed at [facility], [place] [address], [place], [place] [address]   Glucose,  capillary     Status: None   Collection Time: 08/10/18  7:12 AM  Result Value Ref Range  Glucose-Capillary 74 70 - 99 mg/dL    Studies/Results: Dg Chest Port 1 View  Result Date: 08/10/2018 CLINICAL DATA:  Abnormal respirations. EXAM: PORTABLE CHEST 1 VIEW COMPARISON:  One-view chest x-ray 08/09/2018 FINDINGS: The heart is enlarged. Left-sided chest tube was removed. Small apical pneumothorax remains. Left IJ line is stable. Diffuse interstitial edema is stable. Right pleural effusion and airspace disease is stable. IMPRESSION: 1. Interval removal of left-sided chest tube without significant change in large left pleural effusion or small atypical pneumothorax. 2. Stable right pleural effusion. 3. Stable bibasilar airspace disease. 4. Moderate edema unchanged. Electronically Signed   By: San Morelle M.D.   On: 08/10/2018 07:43   Dg Chest Port 1 View  Result Date: 08/09/2018 CLINICAL DATA:  Pneumothorax EXAM: PORTABLE CHEST 1 VIEW COMPARISON:  08/09/2018 FINDINGS: The left-sided chest tube is well positioned. There is a probable trace left apical pneumothorax, better visualized on this examination secondary to patient positioning. There are persistent bibasilar airspace opacities and bilateral pleural effusions. Heart size is stable. The left-sided central venous catheter is stable. IMPRESSION: 1. Probable trace left apical pneumothorax, better visualized on this exam secondary to patient positioning. 2. Stable lines and tubes. 3. Persistent bilateral pleural effusions with adjacent airspace opacities, similar to prior study. Electronically Signed   By: Constance Holster M.D.   On: 08/09/2018 17:22   Dg Chest Port 1 View  Result Date: 08/09/2018 CLINICAL DATA:  Follow-up pneumothorax EXAM: PORTABLE CHEST 1 VIEW COMPARISON:  08/08/2018 FINDINGS: Pigtail chest tube on the left unchanged in position. No pneumothorax on the current study. Mild increase in left pleural effusion and left lower  lobe consolidation Right lower lobe airspace disease and small right effusion unchanged. Central line in the SVC at the cavoatrial junction unchanged. Stent in the right axillary region. IMPRESSION: Left pneumothorax has resolved. Progression of left pleural effusion and left lower lobe airspace disease No change in right lower lobe airspace disease and right effusion. Electronically Signed   By: Franchot Gallo M.D.   On: 08/09/2018 11:25   Dg Chest Port 1 View  Result Date: 08/08/2018 CLINICAL DATA:  Left-sided pneumothorax status post chest tube placement EXAM: PORTABLE CHEST 1 VIEW COMPARISON:  08/08/2018 FINDINGS: The left-sided chest tube and left-sided central venous catheter are both stable. There is a trace left apical pneumothorax. Bilateral pleural effusions are again noted which is stable from prior study. There is no acute osseous abnormality. A vascular stent is noted in the right axilla. The cardiac size is enlarged but stable from prior study. There is likely bibasilar atelectasis. IMPRESSION: 1. Stable lines and tubes. 2. Trace left apical pneumothorax. 3. Persistent bilateral pleural effusions, right greater than left. Electronically Signed   By: Constance Holster M.D.   On: 08/08/2018 19:21   Dg Chest Port 1 View  Result Date: 08/08/2018 CLINICAL DATA:  Left pneumothorax. EXAM: PORTABLE CHEST 1 VIEW COMPARISON:  08/07/2018 and 08/06/2018 FINDINGS: There is no visible residual left pneumothorax. There has been reaccumulation of the left base effusion. Right base effusion persist. Left chest tube and central venous catheter appear unchanged in position. Heart size and pulmonary vascularity are normal. Extensive aortic atherosclerosis. Diffuse osteopenia. IMPRESSION: 1. Resolution of the left base pneumothorax. 2. Reaccumulation of left base pleural effusion. 3. Persistent right pleural effusion. 4.  Aortic Atherosclerosis (ICD10-I70.0). Electronically Signed   By: Lorriane Shire M.D.   On:  08/08/2018 13:14    Medications: I have reviewed the patient's current medications.  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 Unremarkable small bowel PillCam and colonoscopy otherwise. Although patient has not had any rectal bleeding or black stools her hemoglobin dropped from 7-6.4 and was given 1 unit PRBC transfusion, repeat hemoglobin not available. Patient also noted to have drop in platelet count  Multiple other comorbidities including end-stage renal disease on hemodialysis, history of CVA, coronary artery disease, malnutrition, diastolic heart failure  Plan: Drop in hemoglobin without obvious melena or hematochezia. Recommend monitor H&H and transfuse to keep hemoglobin above 7. Will be switching IV Protonix from 8 mg/h to 40 mg every 12 hours.   Ronnette Juniper 08/10/2018, 9:24 AM   Pager [phone] If no answer or after 5 PM call [phone]

## 2018-08-10 NOTE — Progress Notes (Signed)
Pt refused both breakfast and lunch.  Will take occasional sips of drink w/ meds.  Has been given D50 x2 in past 24 hours. Spoke w/ CCM MD to relay. Plan to speak w/ family and consult Palliative Care; palliative care vs NG tube.

## 2018-08-10 NOTE — Progress Notes (Signed)
CRITICAL VALUE ALERT  Critical Value:  Hemoglobin 6.4   Date & Time Notied: 08/10/18 0442  Provider Notified: MD Kamat  Orders Received/Actions taken: 1 unit PRBC ordered.

## 2018-08-10 NOTE — Progress Notes (Signed)
Hypoglycemic Event  CBG: 60    Treatment: D50 25 mL (12.5 gm)  Symptoms: None  Follow-up CBG: Time: 0446 CBG Result:166   Possible Reasons for Event: Unknown     Charmayne Sheer

## 2018-08-10 NOTE — Progress Notes (Signed)
eLink Physician-Brief Progress Note Patient Name: Courtney [name] DOB: [birth year] MRN: [id number]   Date of Service  08/10/2018  HPI/Events of Note  Hemoglobin 6.7 Stable vitals No symptoms  eICU Interventions  1 unit RBC ordered     Intervention Category Major Interventions: Hemorrhage - evaluation and management  Margaretmary Lombard 08/10/2018, 4:51 AM

## 2018-08-10 NOTE — Progress Notes (Signed)
Subjective: Interval History: has no complaint, conversant , off pressors.  Got 1 U PRBC.  Objective: Vital signs in last 24 hours: Temp:  [97.3 F (36.3 C)-98.3 F (36.8 C)] 97.3 F (36.3 C) (06/17 0619) Pulse Rate:  [54-70] 58 (06/17 0654) Resp:  [10-25] 13 (06/17 0654) BP: (85-120)/(30-84) 115/39 (06/17 0654) SpO2:  [95 %-100 %] 100 % (06/17 0654) Weight change:   Intake/Output from previous day: 06/16 0701 - 06/17 0700 In: 739 [P.O.:60; I.V.:679] Out: -  Intake/Output this shift: Total I/O In: 333.8 [P.O.:60; I.V.:273.8] Out: -   General appearance: cooperative, cachectic, no distress, pale and slowed mentation Resp: diminished breath sounds bilaterally Cardio: S1, S2 normal and systolic murmur: systolic ejection 2/6, crescendo and decrescendo at 2nd left intercostal space GI: soft,nontender, pos bs. Extremities: edema none and AVF RUA  Lab Results: Recent Labs    08/09/18 0423  08/09/18 2018 08/10/18 0401  WBC 8.1  --   --  6.3  HGB 7.4*   < > 7.0* 6.4*  HCT 22.6*   < > 21.8* 20.0*  PLT 109*  --   --  76*   < > = values in this interval not displayed.   BMET:  Recent Labs    08/09/18 0423 08/10/18 0401  NA 135 136  K 3.4* 3.3*  CL 103 107  CO2 25 24  GLUCOSE 109* 383*  BUN 20 23  CREATININE 2.29* 3.10*  CALCIUM 7.3* 6.8*   Recent Labs    08/08/18 1325  PTH 70*  Comment   Iron Studies: No results for input(s): IRON, TIBC, TRANSFERRIN, FERRITIN in the last 72 hours.  Studies/Results: Dg Chest Port 1 View  Result Date: 08/09/2018 CLINICAL DATA:  Pneumothorax EXAM: PORTABLE CHEST 1 VIEW COMPARISON:  08/09/2018 FINDINGS: The left-sided chest tube is well positioned. There is a probable trace left apical pneumothorax, better visualized on this examination secondary to patient positioning. There are persistent bibasilar airspace opacities and bilateral pleural effusions. Heart size is stable. The left-sided central venous catheter is stable.  IMPRESSION: 1. Probable trace left apical pneumothorax, better visualized on this exam secondary to patient positioning. 2. Stable lines and tubes. 3. Persistent bilateral pleural effusions with adjacent airspace opacities, similar to prior study. Electronically Signed   By: Constance Holster M.D.   On: 08/09/2018 17:22   Dg Chest Port 1 View  Result Date: 08/09/2018 CLINICAL DATA:  Follow-up pneumothorax EXAM: PORTABLE CHEST 1 VIEW COMPARISON:  08/08/2018 FINDINGS: Pigtail chest tube on the left unchanged in position. No pneumothorax on the current study. Mild increase in left pleural effusion and left lower lobe consolidation Right lower lobe airspace disease and small right effusion unchanged. Central line in the SVC at the cavoatrial junction unchanged. Stent in the right axillary region. IMPRESSION: Left pneumothorax has resolved. Progression of left pleural effusion and left lower lobe airspace disease No change in right lower lobe airspace disease and right effusion. Electronically Signed   By: Franchot Gallo M.D.   On: 08/09/2018 11:25   Dg Chest Port 1 View  Result Date: 08/08/2018 CLINICAL DATA:  Left-sided pneumothorax status post chest tube placement EXAM: PORTABLE CHEST 1 VIEW COMPARISON:  08/08/2018 FINDINGS: The left-sided chest tube and left-sided central venous catheter are both stable. There is a trace left apical pneumothorax. Bilateral pleural effusions are again noted which is stable from prior study. There is no acute osseous abnormality. A vascular stent is noted in the right axilla. The cardiac size is enlarged but stable  from prior study. There is likely bibasilar atelectasis. IMPRESSION: 1. Stable lines and tubes. 2. Trace left apical pneumothorax. 3. Persistent bilateral pleural effusions, right greater than left. Electronically Signed   By: Constance Holster M.D.   On: 08/08/2018 19:21   Dg Chest Port 1 View  Result Date: 08/08/2018 CLINICAL DATA:  Left pneumothorax. EXAM:  PORTABLE CHEST 1 VIEW COMPARISON:  08/07/2018 and 08/06/2018 FINDINGS: There is no visible residual left pneumothorax. There has been reaccumulation of the left base effusion. Right base effusion persist. Left chest tube and central venous catheter appear unchanged in position. Heart size and pulmonary vascularity are normal. Extensive aortic atherosclerosis. Diffuse osteopenia. IMPRESSION: 1. Resolution of the left base pneumothorax. 2. Reaccumulation of left base pleural effusion. 3. Persistent right pleural effusion. 4.  Aortic Atherosclerosis (ICD10-I70.0). Electronically Signed   By: Lorriane Shire M.D.   On: 08/08/2018 13:14    I have reviewed the patient's current medications.  Assessment/Plan: 1 ESRD for HD, no hep, even 2 GIB still lower 3 Anemia on esa 4 HPTH ok 5 Malnutrition 6 CVA  7 CAD 8 DM controlled occ low P HD, esa, follow HB   LOS: 11 days   Aasiyah Auerbach 08/10/2018,6:58 AM

## 2018-08-10 NOTE — Procedures (Signed)
I was present at this session.  I have reviewed the session itself and made appropriate changes.  Hd via RUA avg.  bp 90-110.  tol well, even Mauricia Area 6/17/20202:22 PM

## 2018-08-11 ENCOUNTER — Inpatient Hospital Stay (HOSPITAL_COMMUNITY): Payer: Medicare Other

## 2018-08-11 DIAGNOSIS — D649 Anemia, unspecified: Secondary | ICD-10-CM

## 2018-08-11 LAB — RENAL FUNCTION PANEL
Albumin: 1.8 g/dL — ABNORMAL LOW (ref 3.5–5.0)
Anion gap: 6 (ref 5–15)
BUN: 7 mg/dL — ABNORMAL LOW (ref 8–23)
CO2: 26 mmol/L (ref 22–32)
Calcium: 7.1 mg/dL — ABNORMAL LOW (ref 8.9–10.3)
Chloride: 107 mmol/L (ref 98–111)
Creatinine, Ser: 2.09 mg/dL — ABNORMAL HIGH (ref 0.44–1.00)
GFR calc Af Amer: 26 mL/min — ABNORMAL LOW (ref >60)
GFR calc non Af Amer: 22 mL/min — ABNORMAL LOW (ref >60)
Glucose, Bld: 97 mg/dL (ref 70–99)
Phosphorus: 2 mg/dL — ABNORMAL LOW (ref 2.5–4.6)
Potassium: 3.8 mmol/L (ref 3.5–5.1)
Sodium: 139 mmol/L (ref 135–145)

## 2018-08-11 LAB — CBC WITH DIFFERENTIAL/PLATELET
Abs Immature Granulocytes: 0.05 10*3/uL (ref 0.00–0.07)
Basophils Absolute: 0 10*3/uL (ref 0.0–0.1)
Basophils Relative: 0 %
Eosinophils Absolute: 0 10*3/uL (ref 0.0–0.5)
Eosinophils Relative: 0 %
HCT: 33.8 % — ABNORMAL LOW (ref 36.0–46.0)
Hemoglobin: 11 g/dL — ABNORMAL LOW (ref 12.0–15.0)
Immature Granulocytes: 0 %
Lymphocytes Relative: 4 %
Lymphs Abs: 0.5 10*3/uL — ABNORMAL LOW (ref 0.7–4.0)
MCH: 29.9 pg (ref 26.0–34.0)
MCHC: 32.5 g/dL (ref 30.0–36.0)
MCV: 91.8 fL (ref 80.0–100.0)
Monocytes Absolute: 0.4 10*3/uL (ref 0.1–1.0)
Monocytes Relative: 4 %
Neutro Abs: 10.3 10*3/uL — ABNORMAL HIGH (ref 1.7–7.7)
Neutrophils Relative %: 92 %
Platelets: 71 10*3/uL — ABNORMAL LOW (ref 150–400)
RBC: 3.68 MIL/uL — ABNORMAL LOW (ref 3.87–5.11)
RDW: 18.3 % — ABNORMAL HIGH (ref 11.5–15.5)
WBC: 11.3 10*3/uL — ABNORMAL HIGH (ref 4.0–10.5)
nRBC: 0 % (ref 0.0–0.2)

## 2018-08-11 LAB — TYPE AND SCREEN
ABO/RH(D): A POS
Antibody Screen: NEGATIVE
Unit division: 0
Unit division: 0

## 2018-08-11 LAB — GLUCOSE, CAPILLARY
Glucose-Capillary: 98 mg/dL (ref 70–99)
Glucose-Capillary: 68 mg/dL — ABNORMAL LOW (ref 70–99)
Glucose-Capillary: 145 mg/dL — ABNORMAL HIGH (ref 70–99)
Glucose-Capillary: 90 mg/dL (ref 70–99)
Glucose-Capillary: 102 mg/dL — ABNORMAL HIGH (ref 70–99)

## 2018-08-11 LAB — HEMOGLOBIN AND HEMATOCRIT, BLOOD
HCT: 33.4 % — ABNORMAL LOW (ref 36.0–46.0)
HCT: 36.8 % (ref 36.0–46.0)
Hemoglobin: 11 g/dL — ABNORMAL LOW (ref 12.0–15.0)
Hemoglobin: 11.8 g/dL — ABNORMAL LOW (ref 12.0–15.0)

## 2018-08-11 LAB — BPAM RBC
Blood Product Expiration Date: 202007022359
Blood Product Expiration Date: 202007022359
ISSUE DATE / TIME: 202006170556
ISSUE DATE / TIME: 202006180646
Unit Type and Rh: 6200
Unit Type and Rh: 6200

## 2018-08-11 LAB — MAGNESIUM: Magnesium: 2 mg/dL (ref 1.7–2.4)

## 2018-08-11 MED ORDER — CHLORHEXIDINE GLUCONATE CLOTH 2 % EX PADS
6.0000 | MEDICATED_PAD | Freq: Every day | CUTANEOUS | Status: DC
  Administered 2018-08-12 – 2018-08-16 (×3): 6 via TOPICAL

## 2018-08-11 MED ORDER — JEVITY 1.2 CAL PO LIQD: 1000.0000 mL | ORAL | Status: DC

## 2018-08-11 MED ORDER — POTASSIUM PHOSPHATES 15 MMOLE/5ML IV SOLN
30.0000 mmol | Freq: Once | INTRAVENOUS | Status: AC
  Administered 2018-08-11: 08:00:00 30 mmol via INTRAVENOUS
  Filled 2018-08-11: qty 10

## 2018-08-11 MED ORDER — OSMOLITE 1.5 CAL PO LIQD
1000.0000 mL | ORAL | Status: DC
  Administered 2018-08-11: 1000 mL
  Administered 2018-08-13: 40 mL
  Administered 2018-08-14 – 2018-08-15 (×2): 1000 mL
  Filled 2018-08-11 (×5): qty 1000

## 2018-08-11 NOTE — Progress Notes (Signed)
Pt continues to have liquid stool. Will hold on placing rectal pouch. Contraindicated due to sacral skin breakdown. Per day shift RN, MD would like to hold on placing a flexi-seal. Barrier cream applied. Will continue to monitor.

## 2018-08-11 NOTE — Progress Notes (Addendum)
Nutrition Follow-up / Consult RD working remotely.  DOCUMENTATION CODES:   Severe malnutrition in context of chronic illness, Underweight  INTERVENTION:   Recommend liberalizing diet to regular to help improve intake.  Continue to offer Nepro Shake po BID, each supplement provides 425 kcal and 19 grams protein  Begin TF via NG tube:   Osmolite 1.5 at 20 ml/h, increase by 10 ml every 4 hours to goal rate of 40 ml/h (960 ml per day)   Provides 1440 kcal, 60 gm protein, 732 ml free water daily  NUTRITION DIAGNOSIS:   Severe Malnutrition related to chronic illness as evidenced by severe fat depletion, severe muscle depletion.  Ongoing  GOAL:   Patient will meet greater than or equal to 90% of their needs  Unmet  MONITOR:   PO intake, Supplement acceptance, Labs, TF tolerance, Skin, I & O's  REASON FOR ASSESSMENT:   Consult Enteral/tube feeding initiation and management  ASSESSMENT:   Courtney [name] is a 78 y.o. female with medical history significant of ESRD ON HD, TThsat, diastolic heart failure, stroke, CAD , DM, hypertension, hyperlipidemia presents to ED from HD for hypotension. Minimal history from the patient, her son at bedside and reports she has been lethargic, slightly confused and weak since yesterday. No bloody stools at home.   GI bleeding has resolved. Patient continues to eat very little. She is on a renal diet, only consuming 5% of meals. Per RN, patient is drinking the [facility] well. Patient and family do not want patient to have a PEG. Plans for short term enteral nutrition support. NG tube was placed today. Tip is in the proximal duodenum. Received MD Consult for TF initiation and management.  Labs reviewed. BUN 7 (L), creatinine 2.09 (H), phosphorus 2 (L), potassium 3.8 WNL CBG's: 68-145  Medications reviewed and include Rena-vit, potassium phosphate.  Weight trending down with volume removal. S/P HD yesterday, next HD scheduled for  Friday.  Diet Order:   Diet Order            Diet renal with fluid restriction Fluid restriction: 1200 mL Fluid; Room service appropriate? Yes; Fluid consistency: Thin  Diet effective now              EDUCATION NEEDS:   No education needs have been identified at this time  Skin:  Skin Assessment: Skin Integrity Issues: Skin Integrity Issues:: Stage I, Stage II, Other (Comment) Stage I: buttocks Stage II: sacrum Other: MASD to buttocks, perineum, sacrum  Last BM:  6/18 (type 6)  Height:   Ht Readings from Last 1 Encounters:  08/01/18 4\' 11"  (1.499 m)    Weight:   Wt Readings from Last 1 Encounters:  08/10/18 31 kg    Ideal Body Weight:  44.5 kg  BMI:  Body mass index is 13.8 kg/m.  Estimated Nutritional Needs:   Kcal:  1250-1500  Protein:  55-65 gm  Fluid:  1026ml + UOP    Courtney [name], RD, LDN, [place] Pager [phone] After Hours Pager [phone]

## 2018-08-11 NOTE — Progress Notes (Addendum)
NAME:  Courtney [name], MRN:  [id number], DOB:  1940-03-27, LOS: 12 ADMISSION DATE:  07/30/2018, CONSULTATION DATE:  08/03/2018 REFERRING MD:  TRH, CHIEF COMPLAINT:  Hypotension/ GIB  Brief History   78 year old female admitted 6/6 with hypotension and Hgb of 5.7.  Over the course, she started having rectal bleeding requiring transfusions.  On 6/10, had ongoing bleeding becoming hypotensive requiring vasopressors, GI and IR evaluating. CT abd/pelvis showed no source of bleeding or hematoma, but small amount of gas in the endometrial canal of uncertain etiology with bilateral pleural effusions.  GYN was consulted and recommended no further workup.  Endoscopy was held off given her frailty and high risk.  She started having bloody stool overnight 6/7 and again transfused 1 unit PRBC 6/8. Bowel movements turned blackish/ brown with Hgb stable until she progressively became hypotensive 6/10 with ongoing bloody stools.  She was given given IVF, 1 unit PRBC and transferred to ICU for close monitoring.  Underwent RBC tagged study which was read as positive for GIB with proximal source either stomach or duodenum.  IR was consulted for possible arteriogram and embolization procedure however he thought there was very minimal bleeding to confidently identify bleeding source and recommended GI re-evaluate for either endoscopy or CTA abd/ pelvis.  While in ICU she loss her PIV access while on levophed and PCCM consulted to assist with further medical management and stabilization.  Left thoracentesis completed with iatrogenic pneumothorax s/p chest tube placement.   Past Medical History  ESRD - TTS HD, diastolic HF, stroke, CAD, DM, HTN, [facility]   6/06 Admitted  6/10 tx to ICU 6/13 Bleeding overnight  Consults:  GI Nephrology IR   Procedures:  ETT 6/10 >> 6/11  EGD 6/10  08/05/2018 left thoracentesis 08/05/2018 left chest tube for iatrogenic pneumothorax>> 08/09/2018 08/07/2018 left IJ  CVL>>  Significant Diagnostic Tests:  CT abd / pelvis 6/6 >> Small amount of gas in the endometrial canal. This is of uncertain etiology and significance, but is abnormal, and could indicate endometritis with infection from gas-forming organisms, or could be related to recent procedure. Further clinical evaluation is recommended. No definite source for blood loss confidently identified in the abdomen or pelvis. Small volume of ascites. Large left and moderate right pleural effusions with extensive passive atelectasis in the lower lobes of the lungs bilaterally. Aortic atherosclerosis, in addition to left main and 3 vessel coronary artery disease. Assessment for potential risk factor modification, dietary therapy or pharmacologic therapy may be warranted, if clinically indicated. Additional incidental findings, as above.  RBC tagged study 6/10 >> Positive tagged red blood cell examination for gastrointestinal bleed, with a very proximal source, either the stomach or duodenum. 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  Micro Data:  SARS coronavirus 2 6/6 >> negative MRSA PCR 6/8 >> neg  Antimicrobials:    Interim history/subjective:  No acute events.  Continues to refuse to eat.  Objective   Blood pressure (!) 97/37, pulse 64, temperature 98.1 F (36.7 C), temperature source Oral, resp. rate 14, height 4\' 11"  (1.499 m), weight 31 kg, SpO2 99 %.        Intake/Output Summary (Last 24 hours) at 08/11/2018 0747 Last data filed at 08/11/2018 0100 Gross per 24 hour  Intake 1003.91 ml  Output -50 ml  Net 1053.91 ml   Filed Weights   08/08/18 1300 08/10/18 1320 08/10/18 1726  Weight: 32.5 kg 31 kg 31 kg   Physical Exam: General: Frail  elderly female, awake in bed, follows commands HEENT: No JVD or lymphadenopathy is appreciated.  Left IJ CVL is in place Neuro: Moves all extremities follows commands mild confusion CV: s1s2 rrr, no m/r/g PULM: even/non-labored, lungs bilaterally diminished in bases  VW:PVXY, non-tender, bsx4 active, no overt signs of GI bleeding noted Extremities: warm/dry, negative edema  Skin: no rashes or lesions   Assessment & Plan:   Iatrogenic Pneumothorax - resolved s/p chest tube which was removed 6/16. - No further interventions required.  Hypotension: Initially thought secondary to sedation. Of note, chronically hypotension, acceptable SBP ~ 80. - Accept SBP of 80. - Hope to avoid further levophed (off since AM 6/17). - Continue midodrine (added 08/09/2018). - Transfuse for hemoglobin 6.7.  Acute Hypoxemic Respiratory Failure - resolved - Bronchial hygiene.  Recurrent effusions - s/p left thora 6/12. - Continue volume removal per HD, defer further thoras unless in distress.  GIB - due to duodenal ulcer and AVM s/p endo clip placement and gold probe cauterization. Anemia. - Transfuse for Hgb < 7. - GI following. - Continue PPI.  ESRD. - Continue HD per renal.  Malnutrition - pt continues to refuse to eat. - Place NG tube today and start tube feeds.  Goals of care - palliative care approached with daughter 6/17 and she was not interested. - Will likely need ongoing discussions.  Updated daughter [name] over the phone regarding plans for NGT and tube feeds.  She is hoping that her mother will cooperate and be willing to have NGT placed.  If not, she would not want to pursue PEG as she knows that her mother would not want this  Stable for transfer out of ICU to progressive.  Will ask TRH to assume care in AM 6/19 with PCCM off at that time.  Montey Hora, [facility] Pager: [phone].  If no answer, [phone] 08/11/2018, 8:03 AM

## 2018-08-11 NOTE — Progress Notes (Signed)
Report called to 5W RN

## 2018-08-11 NOTE — TOC Progression Note (Addendum)
Transition of Care [facility]) - Progression Note    Patient Details  Name: MIRYAM MCELHINNEY MRN: [id number] Date of Birth: 1940-04-20  [facility]) CM/SW Contact  Maryclare Labrador, RN Phone Number: 08/11/2018, 9:35 AM  Clinical Narrative:  Pt admitted with GI bleed and developed shock post colonoscopy.   Pt remains in ICU, now off pressors.  Pt is not eating well - NG tube will be placed today and tube feeds will be initiated.Protonix drip has been d/c and pt is now on IV push Protonix BID.   Pt will likely need SNF at discharge, therapy order requested.   CM will continue to follow for discharge needs      Expected Discharge Plan: Skilled Nursing Facility Barriers to Discharge: Other (comment)(May need PEG tube, getting NG tube 6/18 for tube feeds)  Expected Discharge Plan and Services Expected Discharge Plan: [place] arrangements for the past 2 months: Single Family Home Expected Discharge Date: 08/01/18                                     Social Determinants of Health (SDOH) Interventions    Readmission Risk Interventions No flowsheet data found.

## 2018-08-11 NOTE — Progress Notes (Signed)
Attempted to call report

## 2018-08-11 NOTE — Progress Notes (Signed)
Subjective: Interval History: has no complaint.  Objective: Vital signs in last 24 hours: Temp:  [97.2 F (36.2 C)-98.1 F (36.7 C)] 97.9 F (36.6 C) (06/18 0352) Pulse Rate:  [49-65] 65 (06/18 0600) Resp:  [12-31] 16 (06/18 0600) BP: (80-122)/(32-59) 98/46 (06/18 0600) SpO2:  [97 %-100 %] 100 % (06/18 0600) Weight:  [31 kg] 31 kg (06/17 1726) Weight change:   Intake/Output from previous day: 06/17 0701 - 06/18 0700 In: 1003.9 [P.O.:370; I.V.:318.9; Blood:315] Out: -50  Intake/Output this shift: Total I/O In: 140 [P.O.:120; I.V.:20] Out: -   General appearance: cooperative, cachectic, no distress, slowed mentation and awake, coop but not conversant. Resp: rales bibasilar Cardio: S1, S2 normal and systolic murmur: systolic ejection 2/6, crescendo and decrescendo at 2nd left intercostal space GI: pos bs, soft Extremities: AVF RUA  CXR fluid xs  Lab Results: Recent Labs    08/10/18 0401  08/10/18 1959 08/11/18 0430  WBC 6.3  --   --  11.3*  HGB 6.4*   < > 11.5* 11.0*  HCT 20.0*   < > 35.0* 33.8*  PLT 76*  --   --  71*   < > = values in this interval not displayed.   BMET:  Recent Labs    08/10/18 0401 08/11/18 0430  NA 136 139  K 3.3* 3.8  CL 107 107  CO2 24 26  GLUCOSE 383* 97  BUN 23 7*  CREATININE 3.10* 2.09*  CALCIUM 6.8* 7.1*   Recent Labs    08/08/18 1325  PTH 70*  Comment   Iron Studies: No results for input(s): IRON, TIBC, TRANSFERRIN, FERRITIN in the last 72 hours.  Studies/Results: Dg Chest Port 1 View  Result Date: 08/10/2018 CLINICAL DATA:  Abnormal respirations. EXAM: PORTABLE CHEST 1 VIEW COMPARISON:  One-view chest x-ray 08/09/2018 FINDINGS: The heart is enlarged. Left-sided chest tube was removed. Small apical pneumothorax remains. Left IJ line is stable. Diffuse interstitial edema is stable. Right pleural effusion and airspace disease is stable. IMPRESSION: 1. Interval removal of left-sided chest tube without significant change in  large left pleural effusion or small atypical pneumothorax. 2. Stable right pleural effusion. 3. Stable bibasilar airspace disease. 4. Moderate edema unchanged. Electronically Signed   By: San Morelle M.D.   On: 08/10/2018 07:43   Dg Chest Port 1 View  Result Date: 08/09/2018 CLINICAL DATA:  Pneumothorax EXAM: PORTABLE CHEST 1 VIEW COMPARISON:  08/09/2018 FINDINGS: The left-sided chest tube is well positioned. There is a probable trace left apical pneumothorax, better visualized on this examination secondary to patient positioning. There are persistent bibasilar airspace opacities and bilateral pleural effusions. Heart size is stable. The left-sided central venous catheter is stable. IMPRESSION: 1. Probable trace left apical pneumothorax, better visualized on this exam secondary to patient positioning. 2. Stable lines and tubes. 3. Persistent bilateral pleural effusions with adjacent airspace opacities, similar to prior study. Electronically Signed   By: Constance Holster M.D.   On: 08/09/2018 17:22   Dg Chest Port 1 View  Result Date: 08/09/2018 CLINICAL DATA:  Follow-up pneumothorax EXAM: PORTABLE CHEST 1 VIEW COMPARISON:  08/08/2018 FINDINGS: Pigtail chest tube on the left unchanged in position. No pneumothorax on the current study. Mild increase in left pleural effusion and left lower lobe consolidation Right lower lobe airspace disease and small right effusion unchanged. Central line in the SVC at the cavoatrial junction unchanged. Stent in the right axillary region. IMPRESSION: Left pneumothorax has resolved. Progression of left pleural effusion and left lower lobe  airspace disease No change in right lower lobe airspace disease and right effusion. Electronically Signed   By: Franchot Gallo M.D.   On: 08/09/2018 11:25    I have reviewed the patient's current medications.  Assessment/Plan: 1 ESRD HD yest. Has xs vol on CXR.  Will slowly lower. bp marginal 2 Anemia TX yest. Esa.  3  HPTH 4 Malnutrition 5 GIB ongoing 6 Hx CVA 7 DM 8 CAD P HD tomorrow, lower vol, follow K. Cont PPI    LOS: 12 days   [name] Arel Tippen 08/11/2018,6:48 AM

## 2018-08-11 NOTE — Progress Notes (Signed)
Subjective: Has not had a bowel movement in 24 hours. Eating very little. Off IV pressors.  Objective: Vital signs in last 24 hours: Temp:  [97.2 F (36.2 C)-98.1 F (36.7 C)] 98.1 F (36.7 C) (06/18 0700) Pulse Rate:  [49-65] 64 (06/18 0700) Resp:  [13-31] 14 (06/18 0700) BP: (80-113)/(32-59) 97/37 (06/18 0700) SpO2:  [97 %-100 %] 99 % (06/18 0700) Weight:  [31 kg] 31 kg (06/17 1726) Weight change:  Last BM Date: 08/11/18  PE: Frail, cachectic, chronically ill-appearing GENERAL: Lying on bed, not in distress ABDOMEN: Soft, nontender EXTREMITIES: no deformity  Lab Results: Results for orders placed or performed during the hospital encounter of 07/30/18 (from the past 48 hour(s))  Glucose, capillary     Status: Abnormal   Collection Time: 08/09/18 11:02 AM  Result Value Ref Range   Glucose-Capillary 69 (L) 70 - 99 mg/dL  Glucose, capillary     Status: None   Collection Time: 08/09/18  3:28 PM  Result Value Ref Range   Glucose-Capillary 80 70 - 99 mg/dL  Glucose, capillary     Status: None   Collection Time: 08/09/18  7:21 PM  Result Value Ref Range   Glucose-Capillary 88 70 - 99 mg/dL  Hemoglobin and hematocrit, blood     Status: Abnormal   Collection Time: 08/09/18  8:18 PM  Result Value Ref Range   Hemoglobin 7.0 (L) 12.0 - 15.0 g/dL   HCT 21.8 (L) 36.0 - 46.0 %    Comment: Performed at [facility], [address] [place], [place] [address]  Glucose, capillary     Status: None   Collection Time: 08/09/18 11:33 PM  Result Value Ref Range   Glucose-Capillary 76 70 - 99 mg/dL  Glucose, capillary     Status: Abnormal   Collection Time: 08/10/18  3:50 AM  Result Value Ref Range   Glucose-Capillary 60 (L) 70 - 99 mg/dL  CBC     Status: Abnormal   Collection Time: 08/10/18  4:01 AM  Result Value Ref Range   WBC 6.3 4.0 - 10.5 K/uL   RBC 2.13 (L) 3.87 - 5.11 MIL/uL   Hemoglobin 6.4 (LL) 12.0 - 15.0 g/dL    Comment: REPEATED TO VERIFY THIS CRITICAL RESULT  HAS VERIFIED AND BEEN CALLED TO RN PHILLIPS K. BY MESSAN HOUEGNIFIO ON 06 17 2020 AT 0442, AND HAS BEEN READ BACK.     HCT 20.0 (L) 36.0 - 46.0 %   MCV 93.9 80.0 - 100.0 fL   MCH 30.0 26.0 - 34.0 pg   MCHC 32.0 30.0 - 36.0 g/dL   RDW 19.1 (H) 11.5 - 15.5 %   Platelets 76 (L) 150 - 400 K/uL    Comment: REPEATED TO VERIFY SPECIMEN CHECKED FOR CLOTS Immature Platelet Fraction may be clinically indicated, consider ordering this additional test [id number] CONSISTENT WITH PREVIOUS RESULT    nRBC 0.0 0.0 - 0.2 %    Comment: Performed at [facility], [place] [address] [place], [place] [address]  Renal function panel     Status: Abnormal   Collection Time: 08/10/18  4:01 AM  Result Value Ref Range   Sodium 136 135 - 145 mmol/L   Potassium 3.3 (L) 3.5 - 5.1 mmol/L   Chloride 107 98 - 111 mmol/L   CO2 24 22 - 32 mmol/L   Glucose, Bld 383 (H) 70 - 99 mg/dL   BUN 23 8 - 23 mg/dL   Creatinine, Ser 3.10 (H) 0.44 - 1.00  mg/dL   Calcium 6.8 (L) 8.9 - 10.3 mg/dL   Phosphorus 4.1 2.5 - 4.6 mg/dL   Albumin 1.4 (L) 3.5 - 5.0 g/dL   GFR calc non Af Amer 14 (L) >60 mL/min   GFR calc Af Amer 16 (L) >60 mL/min   Anion gap 5 5 - 15    Comment: Performed at [facility] [address], [place], [place] [address]  Magnesium     Status: Abnormal   Collection Time: 08/10/18  4:01 AM  Result Value Ref Range   Magnesium 1.5 (L) 1.7 - 2.4 mg/dL    Comment: Performed at [facility] [address], [place],  [address]  Glucose, capillary     Status: Abnormal   Collection Time: 08/10/18  4:46 AM  Result Value Ref Range   Glucose-Capillary 166 (H) 70 - 99 mg/dL   Comment 1 Notify RN    Comment 2 Document in Chart   Prepare RBC     Status: None   Collection Time: 08/10/18  4:53 AM  Result Value Ref Range   Order Confirmation      ORDER PROCESSED BY BLOOD BANK BB SAMPLE OR UNITS ALREADY AVAILABLE Performed at [facility], [place] [address] [place], [place] [address]    Glucose, capillary     Status: None   Collection Time: 08/10/18  7:12 AM  Result Value Ref Range   Glucose-Capillary 74 70 - 99 mg/dL  Glucose, capillary     Status: Abnormal   Collection Time: 08/10/18 11:06 AM  Result Value Ref Range   Glucose-Capillary 59 (L) 70 - 99 mg/dL  Glucose, capillary     Status: Abnormal   Collection Time: 08/10/18 11:54 AM  Result Value Ref Range   Glucose-Capillary 163 (H) 70 - 99 mg/dL  Hemoglobin and hematocrit, blood     Status: Abnormal   Collection Time: 08/10/18 12:20 PM  Result Value Ref Range   Hemoglobin 10.0 (L) 12.0 - 15.0 g/dL    Comment: REPEATED TO VERIFY POST TRANSFUSION SPECIMEN    HCT 31.1 (L) 36.0 - 46.0 %    Comment: Performed at [facility], [place] [address] [place], [place] [address]  Glucose, capillary     Status: None   Collection Time: 08/10/18  3:14 PM  Result Value Ref Range   Glucose-Capillary 74 70 - 99 mg/dL  Hemoglobin and hematocrit, blood     Status: Abnormal   Collection Time: 08/10/18  3:30 PM  Result Value Ref Range   Hemoglobin 9.6 (L) 12.0 - 15.0 g/dL   HCT 29.3 (L) 36.0 - 46.0 %    Comment: Performed at [facility] [address] [place], [place] [address]  Glucose, capillary     Status: None   Collection Time: 08/10/18  7:10 PM  Result Value Ref Range   Glucose-Capillary 82 70 - 99 mg/dL  Hemoglobin and hematocrit, blood     Status: Abnormal   Collection Time: 08/10/18  7:59 PM  Result Value Ref Range   Hemoglobin 11.5 (L) 12.0 - 15.0 g/dL   HCT 35.0 (L) 36.0 - 46.0 %    Comment: Performed at [facility], [place] [address] [place], [place] [address]  Glucose, capillary     Status: Abnormal   Collection Time: 08/10/18 11:04 PM  Result Value Ref Range   Glucose-Capillary 62 (L) 70 - 99 mg/dL  Glucose, capillary     Status: None  Collection Time: 08/10/18 11:06 PM  Result Value Ref Range   Glucose-Capillary 81 70 - 99 mg/dL  Glucose, capillary     Status: None   Collection Time:  08/11/18  3:50 AM  Result Value Ref Range   Glucose-Capillary 98 70 - 99 mg/dL  Renal function panel     Status: Abnormal   Collection Time: 08/11/18  4:30 AM  Result Value Ref Range   Sodium 139 135 - 145 mmol/L   Potassium 3.8 3.5 - 5.1 mmol/L   Chloride 107 98 - 111 mmol/L   CO2 26 22 - 32 mmol/L   Glucose, Bld 97 70 - 99 mg/dL   BUN 7 (L) 8 - 23 mg/dL   Creatinine, Ser 2.09 (H) 0.44 - 1.00 mg/dL    Comment: DELTA CHECK NOTED   Calcium 7.1 (L) 8.9 - 10.3 mg/dL   Phosphorus 2.0 (L) 2.5 - 4.6 mg/dL   Albumin 1.8 (L) 3.5 - 5.0 g/dL   GFR calc non Af Amer 22 (L) >60 mL/min   GFR calc Af Amer 26 (L) >60 mL/min   Anion gap 6 5 - 15    Comment: Performed at [facility] [address], [place], [place] [address]  CBC with Differential/Platelet     Status: Abnormal   Collection Time: 08/11/18  4:30 AM  Result Value Ref Range   WBC 11.3 (H) 4.0 - 10.5 K/uL   RBC 3.68 (L) 3.87 - 5.11 MIL/uL   Hemoglobin 11.0 (L) 12.0 - 15.0 g/dL   HCT 33.8 (L) 36.0 - 46.0 %   MCV 91.8 80.0 - 100.0 fL   MCH 29.9 26.0 - 34.0 pg   MCHC 32.5 30.0 - 36.0 g/dL   RDW 18.3 (H) 11.5 - 15.5 %   Platelets 71 (L) 150 - 400 K/uL    Comment: REPEATED TO VERIFY Immature Platelet Fraction may be clinically indicated, consider ordering this additional test [id number] CONSISTENT WITH PREVIOUS RESULT    nRBC 0.0 0.0 - 0.2 %   Neutrophils Relative % 92 %   Neutro Abs 10.3 (H) 1.7 - 7.7 K/uL   Lymphocytes Relative 4 %   Lymphs Abs 0.5 (L) 0.7 - 4.0 K/uL   Monocytes Relative 4 %   Monocytes Absolute 0.4 0.1 - 1.0 K/uL   Eosinophils Relative 0 %   Eosinophils Absolute 0.0 0.0 - 0.5 K/uL   Basophils Relative 0 %   Basophils Absolute 0.0 0.0 - 0.1 K/uL   Immature Granulocytes 0 %   Abs Immature Granulocytes 0.05 0.00 - 0.07 K/uL    Comment: Performed at [facility] [address] [place], [place] [address]  Magnesium     Status: None   Collection Time: 08/11/18  4:30 AM  Result Value Ref Range    Magnesium 2.0 1.7 - 2.4 mg/dL    Comment: Performed at [facility] [address], [place], [place] [address]  Glucose, capillary     Status: Abnormal   Collection Time: 08/11/18  7:07 AM  Result Value Ref Range   Glucose-Capillary 68 (L) 70 - 99 mg/dL  Hemoglobin and hematocrit, blood     Status: Abnormal   Collection Time: 08/11/18  8:30 AM  Result Value Ref Range   Hemoglobin 11.0 (L) 12.0 - 15.0 g/dL   HCT 33.4 (L) 36.0 - 46.0 %    Comment: Performed at [facility], [place] [address], [place], [place] [address]    Studies/Results: Dg Chest  Port 1 View  Result Date: 08/10/2018 CLINICAL DATA:  Abnormal respirations. EXAM: PORTABLE CHEST 1 VIEW COMPARISON:  One-view chest x-ray 08/09/2018 FINDINGS: The heart is enlarged. Left-sided chest tube was removed. Small apical pneumothorax remains. Left IJ line is stable. Diffuse interstitial edema is stable. Right pleural effusion and airspace disease is stable. IMPRESSION: 1. Interval removal of left-sided chest tube without significant change in large left pleural effusion or small atypical pneumothorax. 2. Stable right pleural effusion. 3. Stable bibasilar airspace disease. 4. Moderate edema unchanged. Electronically Signed   By: San Morelle M.D.   On: 08/10/2018 07:43   Dg Chest Port 1 View  Result Date: 08/09/2018 CLINICAL DATA:  Pneumothorax EXAM: PORTABLE CHEST 1 VIEW COMPARISON:  08/09/2018 FINDINGS: The left-sided chest tube is well positioned. There is a probable trace left apical pneumothorax, better visualized on this examination secondary to patient positioning. There are persistent bibasilar airspace opacities and bilateral pleural effusions. Heart size is stable. The left-sided central venous catheter is stable. IMPRESSION: 1. Probable trace left apical pneumothorax, better visualized on this exam secondary to patient positioning. 2. Stable lines and tubes. 3. Persistent bilateral pleural effusions with adjacent  airspace opacities, similar to prior study. Electronically Signed   By: Constance Holster M.D.   On: 08/09/2018 17:22    Medications: I have reviewed the patient's current medications.  Assessment: Upper GI bleeding, status post Endo Clip placement at area of visible vessel, status post gold probe cautery of bleeding duodenal AVM.  Hemoglobin has remained stable 10/9.6/11.07/03/09 Last PRBC transfusion was 1 unit on 08/10/2018 So far has received 6 units PRBC transfusion since 07/30/2018 Has been transitioned to Protonix 40 mg IV every 12 hours  Malnutrition, not eating well, may need a feeding tube for protein calorie malnutrition  Patient was on aspirin 81 mg and Plavix 75 mg?  After AV graft recanalization on 07/20/2018  Multiple comorbidities-diabetes mellitus, CHF, history of CVA and coronary artery disease, end-stage renal disease on hemodialysis  Plan: PPI twice daily for at least 4 weeks thereafter once a day indefinitely. Recommend avoiding NSAIDs and antiplatelets. If absolutely needed, recommend aspirin 81 mg only, possibly after 1 week, as patient had major GI bleeding requiring multiple units of transfusion and is status post 2 EGDs, one colonoscopy and 1 PillCam study.  If protein calorie malnutrition continues with decreased oral intake, primary team needs to discuss with family members regarding feeding tube, if indicated recommend IR consult for such.  At this point, GI bleeding seems to have resolved. We will sign off, please recall if needed.   Ronnette Juniper 08/11/2018, 8:41 AM   Pager [phone] If no answer or after 5 PM call [phone]

## 2018-08-12 LAB — GLUCOSE, CAPILLARY
Glucose-Capillary: 103 mg/dL — ABNORMAL HIGH (ref 70–99)
Glucose-Capillary: 106 mg/dL — ABNORMAL HIGH (ref 70–99)
Glucose-Capillary: 106 mg/dL — ABNORMAL HIGH (ref 70–99)
Glucose-Capillary: 119 mg/dL — ABNORMAL HIGH (ref 70–99)
Glucose-Capillary: 125 mg/dL — ABNORMAL HIGH (ref 70–99)
Glucose-Capillary: 84 mg/dL (ref 70–99)
Glucose-Capillary: 98 mg/dL (ref 70–99)

## 2018-08-12 LAB — RENAL FUNCTION PANEL
Albumin: 1.7 g/dL — ABNORMAL LOW (ref 3.5–5.0)
Anion gap: 7 (ref 5–15)
BUN: 18 mg/dL (ref 8–23)
CO2: 23 mmol/L (ref 22–32)
Calcium: 6.6 mg/dL — ABNORMAL LOW (ref 8.9–10.3)
Chloride: 106 mmol/L (ref 98–111)
Creatinine, Ser: 2.8 mg/dL — ABNORMAL HIGH (ref 0.44–1.00)
GFR calc Af Amer: 18 mL/min — ABNORMAL LOW (ref >60)
GFR calc non Af Amer: 16 mL/min — ABNORMAL LOW (ref >60)
Glucose, Bld: 137 mg/dL — ABNORMAL HIGH (ref 70–99)
Phosphorus: 4.1 mg/dL (ref 2.5–4.6)
Potassium: 4.5 mmol/L (ref 3.5–5.1)
Sodium: 136 mmol/L (ref 135–145)

## 2018-08-12 LAB — MAGNESIUM: Magnesium: 2 mg/dL (ref 1.7–2.4)

## 2018-08-12 LAB — HEMOGLOBIN AND HEMATOCRIT, BLOOD
HCT: 36.5 % (ref 36.0–46.0)
HCT: 35 % — ABNORMAL LOW (ref 36.0–46.0)
Hemoglobin: 11.8 g/dL — ABNORMAL LOW (ref 12.0–15.0)
Hemoglobin: 11.2 g/dL — ABNORMAL LOW (ref 12.0–15.0)

## 2018-08-12 LAB — CBC
HCT: 32.9 % — ABNORMAL LOW (ref 36.0–46.0)
Hemoglobin: 10.7 g/dL — ABNORMAL LOW (ref 12.0–15.0)
MCH: 30.1 pg (ref 26.0–34.0)
MCHC: 32.5 g/dL (ref 30.0–36.0)
MCV: 92.7 fL (ref 80.0–100.0)
Platelets: 81 10*3/uL — ABNORMAL LOW (ref 150–400)
RBC: 3.55 MIL/uL — ABNORMAL LOW (ref 3.87–5.11)
RDW: 18.6 % — ABNORMAL HIGH (ref 11.5–15.5)
WBC: 10 10*3/uL (ref 4.0–10.5)
nRBC: 0 % (ref 0.0–0.2)

## 2018-08-12 MED ORDER — CALCITRIOL 0.25 MCG PO CAPS
0.2500 ug | ORAL_CAPSULE | Freq: Every day | ORAL | Status: DC
  Administered 2018-08-12 – 2018-08-19 (×8): 0.25 ug via ORAL
  Filled 2018-08-12 (×8): qty 1

## 2018-08-12 MED ORDER — PANTOPRAZOLE SODIUM 40 MG PO TBEC
40.0000 mg | DELAYED_RELEASE_TABLET | Freq: Two times a day (BID) | ORAL | Status: DC
  Administered 2018-08-12 – 2018-08-19 (×15): 40 mg via ORAL
  Filled 2018-08-12 (×15): qty 1

## 2018-08-12 NOTE — Progress Notes (Signed)
[facility]    NEPHROLOGY PROGRESS NOTE  SUBJECTIVE: Patient seen and examined.  Patient resting on examination.  The patient provides no acute complaints.  Review of chart reveals a negative 10 point review of systems.   OBJECTIVE:  Vitals:   08/12/18 0851 08/12/18 1343  BP: (!) 105/52 (!) 108/50  Pulse: 63 63  Resp: 17 17  Temp:  97.9 F (36.6 C)  SpO2: 98% 99%    Intake/Output Summary (Last 24 hours) at 08/12/2018 1411 Last data filed at 08/11/2018 1700 Gross per 24 hour  Intake 96.51 ml  Output 0 ml  Net 96.51 ml      General: Resting, NAD HEENT: MMM  AT anicteric sclera Neck:  No JVD, no adenopathy CV:  Heart RRR  Lungs:  L/S CTA bilaterally Abd:  abd SNT/ND with normal BS GU:  Bladder non-palpable Extremities:  No LE edema. Skin:  No skin rash  MEDICATIONS:  . Chlorhexidine Gluconate Cloth  6 each Topical Q0600  . darbepoetin (ARANESP) injection - DIALYSIS  200 mcg Intravenous Q Fri-HD  . escitalopram  5 mg Oral Daily  . feeding supplement (NEPRO CARB STEADY)  237 mL Oral BID BM  . midodrine  10 mg Oral TID WC  . multivitamin  1 tablet Oral QHS  . Pancrealipase (Lip-Prot-Amyl) CPEP 6,000 units  1 capsule Oral TID WC  . pantoprazole  40 mg Oral BID  . sodium chloride flush  10-40 mL Intracatheter Q12H       LABS:   CBC Latest Ref Rng & Units 08/12/2018 08/12/2018 08/11/2018  WBC 4.0 - 10.5 K/uL - 10.0 -  Hemoglobin 12.0 - 15.0 g/dL 11.8(L) 10.7(L) 11.8(L)  Hematocrit 36.0 - 46.0 % 36.5 32.9(L) 36.8  Platelets 150 - 400 K/uL - 81(L) -    CMP Latest Ref Rng & Units 08/12/2018 08/11/2018 08/10/2018  Glucose 70 - 99 mg/dL 137(H) 97 383(H)  BUN 8 - 23 mg/dL 18 7(L) 23  Creatinine 0.44 - 1.00 mg/dL 2.80(H) 2.09(H) 3.10(H)  Sodium 135 - 145 mmol/L 136 139 136  Potassium 3.5 - 5.1 mmol/L 4.5 3.8 3.3(L)  Chloride 98 - 111 mmol/L 106 107 107  CO2 22 - 32 mmol/L 23 26 24   Calcium 8.9 - 10.3 mg/dL 6.6(L) 7.1(L) 6.8(L)  Total Protein 6.5 - 8.1  g/dL - - -  Total Bilirubin 0.3 - 1.2 mg/dL - - -  Alkaline Phos 38 - 126 U/L - - -  AST 15 - 41 U/L - - -  ALT 0 - 44 U/L - - -    Lab Results  Component Value Date   PTH 70 (H) 08/08/2018   PTH Comment 08/08/2018   CALCIUM 6.6 (L) 08/12/2018   CAION 1.05 (L) 08/03/2018   PHOS 4.1 08/12/2018       Component Value Date/Time   COLORURINE YELLOW 07/17/2012 1751   APPEARANCEUR CLOUDY (A) 07/17/2012 1751   LABSPEC 1.020 07/17/2012 1751   PHURINE 5.5 07/17/2012 1751   GLUCOSEU NEGATIVE 07/17/2012 1751   HGBUR SMALL (A) 07/17/2012 1751   BILIRUBINUR NEGATIVE 07/17/2012 1751   KETONESUR NEGATIVE 07/17/2012 1751   PROTEINUR 30 (A) 07/17/2012 1751   UROBILINOGEN 0.2 07/17/2012 1751   NITRITE NEGATIVE 07/17/2012 1751   LEUKOCYTESUR SMALL (A) 07/17/2012 1751      Component Value Date/Time   PHART 7.396 08/04/2018 1345   PCO2ART 33.6 08/04/2018 1345   PO2ART 182 (H) 08/04/2018 1345   HCO3 20.2 08/04/2018 1345   TCO2 23 08/03/2018  2248   ACIDBASEDEF 3.8 (H) 08/04/2018 1345   O2SAT 99.4 08/04/2018 1345       Component Value Date/Time   IRON 111 07/31/2018 0336   TIBC 136 (L) 07/31/2018 0336   FERRITIN 1,324 (H) 07/31/2018 0336   IRONPCTSAT 82 (H) 07/31/2018 0336       ASSESSMENT/PLAN:    78 year old female patient with a past medical history significant for chronic diastolic heart failure, coronary artery disease, type 2 diabetes, hypertension, hyperlipidemia, stroke and end-stage renal disease on hemodialysis Monday, Wednesday, and Friday who presented on 07/30/2018 with altered mental status and anemia with a hemoglobin of 5.7.  An EGD revealed an AVM.  She also underwent a colonoscopy and repeat EGD.  1.  End-stage renal disease on hemodialysis.  We will continue dialysis on Monday, Wednesday, and Friday.  2.  Acute blood loss anemia.  Hemoglobin is stable and at goal.  3.  Secondary hyperparathyroidism.  PTH was 70.  Phosphorus stable at 4.1.  Will add calcitriol given  markedly low calcium level.  Will check a vitamin D level.    [place], DO, [place]

## 2018-08-12 NOTE — Care Management Important Message (Signed)
Important Message  Patient Details  Name: Courtney [name] MRN: [id number] Date of Birth: 19-Nov-1940   Medicare Important Message Given:  Yes     [name] Montine Circle 08/12/2018, 11:27 AM

## 2018-08-12 NOTE — Progress Notes (Addendum)
Nutrition Follow-up  DOCUMENTATION CODES:   Severe malnutrition in context of chronic illness, Underweight  INTERVENTION:   -Continue Nepro Shake po BID, each supplement provides 425 kcal and 19 grams protein -Magic cup TID with meals, each supplement provides 290 kcal and 9 grams of protein  -Feeding assistance with meals -Continue renal MVI daily -Continue Osmolite 1.5 @ 40 ml/hr via cortrak tube  Tube feeding regimen provides 1440 kcal (100% of needs), 60 grams of protein, and 732 ml of H2O.    NUTRITION DIAGNOSIS:   Severe Malnutrition related to chronic illness as evidenced by severe fat depletion, severe muscle depletion.  Ongoing  GOAL:   Patient will meet greater than or equal to 90% of their needs  Met with TF  MONITOR:   PO intake, Supplement acceptance, Labs, TF tolerance, Skin, I & O's  REASON FOR ASSESSMENT:   Consult Enteral/tube feeding initiation and management  ASSESSMENT:   Courtney [name] is a 78 y.o. female with medical history significant of ESRD ON HD, TThsat, diastolic heart failure, stroke, CAD , DM, hypertension, hyperlipidemia presents to ED from HD for hypotension. Minimal history from the patient, her son at bedside and reports she has been lethargic, slightly confused and weak since yesterday. No bloody stools at home.   Reviewed I/O's: +758 ml x 24 hours and +10 L since admission  UOP: 0 ml x 24 hours  Per RN notes, pt remains with diarrhea, however, plan to hold off on rectal tube placement for now.   Pt remains with poor oral intake. Visited pt and saw breakfast tray was unattempted. Therapy notes also indicate that pt was too lethargic to participate in therapies today.   NGT placed yesterday and TF initiated. Per chart review, pt family does not desire PEG, but plans to continue NGT for short term enteral nutrition support. Will consider doing a calorie count once pt's mental status improves to see if she is able to demonstrate adequate  PO intake. Doubtful that pt will be able to meet her needs via PO intake alone at this time. Palliative care consult for goals of care pending.   Labs reviewed: CBGS: 103-125  Diet Order:   Diet Order            Diet renal with fluid restriction Fluid restriction: 1200 mL Fluid; Room service appropriate? No; Fluid consistency: Thin  Diet effective now              EDUCATION NEEDS:   No education needs have been identified at this time  Skin:  Skin Assessment: Skin Integrity Issues: Skin Integrity Issues:: Stage I, Stage II, Other (Comment) Stage I: buttocks Stage II: sacrum Other: MASD to buttocks, perineum, sacrum  Last BM:  6/18 (type 6)  Height:   Ht Readings from Last 1 Encounters:  08/01/18 4' 11"  (1.499 m)    Weight:   Wt Readings from Last 1 Encounters:  08/12/18 40.5 kg    Ideal Body Weight:  44.5 kg  BMI:  Body mass index is 18.03 kg/m.  Estimated Nutritional Needs:   Kcal:  1250-1500  Protein:  55-65 gm  Fluid:  1029m + UOP    [name] A. WJimmye Norman RD, LDN, CNapaRegistered Dietitian II Certified Diabetes Care and Education Specialist Pager: [phone] hours Pager: [phone]

## 2018-08-12 NOTE — Progress Notes (Addendum)
VAST RN consulted to remove CL. Communicated with ordering physician that this patient has very poor vasculature and if she continues to need IV meds or fluids, it would be in her best interest to leave the CL in place. Requested physician agreement before proceeding to discontinue CL. Aline August, MD confirmed he wants CL discontinued now.

## 2018-08-12 NOTE — Progress Notes (Signed)
PT Cancellation Note  Patient Details Name: Courtney [name] MRN: [id number] DOB: 02/15/1941   Cancelled Treatment:    Reason Eval/Treat Not Completed: Fatigue/lethargy limiting ability to participate. Pt is too lethargic to actively participate, will try again at another time.   Ramond Dial 08/12/2018, 2:43 PM  Mee Hives, PT MS Acute Rehab Dept. Number: [place] and [place]

## 2018-08-12 NOTE — Progress Notes (Signed)
Patient ID: Courtney [name], female   DOB: 12/08/1940, 78 y.o.   MRN: [id number]  PROGRESS NOTE    Courtney [name]  JTT:[id number] DOB: 07-14-1940 DOA: 07/30/2018 PCP: Neale Burly, MD   Brief Narrative:  78 year old female with history of end-stage renal disease on hemodialysis, chronic diastolic heart failure, CAD, diabetes mellitus type 2, hypertension, hyperlipidemia, stroke presented on 07/30/2018 with altered mental status and hemoglobin of 5.7.  GI and nephrology were consulted.  She was transfused 2 units packed red cells.  CT of the abdomen and pelvis showed small amount of gas in the endometrial canal of uncertain etiology with bilateral pleural effusions.  This was discussed with GYN on-call who did not recommend any further work-up.  Patient has had a long hospitalization.  Initially there was no plans for any endoscopic procedures but patient continued to have intermittent blood in stools.  She required more packed red cell transfusion.  Patient was transferred to ICU on 08/03/2018.  Tagged red cell study was positive with concern for proximal bleeding source such as duodenum or stomach.  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  Patient also underwent left-sided thoracentesis on 08/05/2018 for left pleural effusion and subsequently developed pneumothorax for which chest tube was placed.  Patient again had capsule bleeding on 08/06/2018 and hemoglobin had dropped to 6.7 from 7.7.  She was subsequently transfused packed red cells again.  She underwent colonoscopy on 08/07/2018 which showed old blood in the entire colon, no active bleeding noted on lavage, few polyps noted but not removed and terminal ileum showed old blood as well.  She had repeat EGD on 08/07/2018 which showed small amount of blood in fundus, duodenal ulcer and visible vessel without active bleeding for which one Endo Clip was placed.  Patient was continued on Protonix  drip.  Small bowel PillCam was placed.  Chest tube was removed on 08/09/2018. subsequently, her oral intake has been very poor.  NG tube feeding has been started.  She was transferred back to [facility] service on 08/12/2018.  Assessment & Plan:  Acute on chronic blood loss anemia Upper GI bleeding most likely duodenal bleed -Patient presented with hemoglobin of 5.7.  Has had 7 units of packed red cells transfusion since admission, last transfusion was on 08/10/2018. -Hemoglobin 10.7 this morning.  No overnight black or bloody stools -Tagged red cell study was positive with concern for proximal bleeding source such as duodenum or stomach. -Status post EGD on 08/03/2018 which showed duodenal AVM which was injected and treated with gold probe.   -Status post  colonoscopy on 08/07/2018 which showed old blood in the entire colon, no active bleeding noted on lavage, few polyps noted but not removed and terminal ileum showed old blood as well.  She had repeat EGD on 08/07/2018 which showed small amount of blood in fundus, duodenal ulcer and visible vessel without active bleeding for which one Endo Clip was placed. -Currently on IV Protonix twice a day.  We will switch to oral Protonix.  GI recommends PPI twice a day for at least 4 weeks thereafter once a day indefinitely.  Aspirin and Plavix on hold.  If absolutely needed, GI recommends aspirin 81 mg only, possibly after a week.  GI has signed off.  Outpatient follow-up with GI. -We will remove left IJ central venous catheter.  Iatrogenic pneumothorax -Resolved.  Status post removal of chest tube on 08/09/2018.  PCCM has  signed off  End-stage renal disease on hemodialysis Hypotension -Dialysis as per nephrology schedule -Blood pressure on the lower side but stable.  Continue midodrine.  Recurrent pleural effusions -Status post left thoracentesis with subsequent pneumothorax and chest tube placement on 08/05/2018.  Continue volume removal as per HD.  Defer further  thoracentesis unless patient is in severe respiratory distress  Malnutrition Hypoalbuminemia -Patient continues to refuse to eat.  Currently has NG tube with NG tube feeding. -Patient/family not interested in pursuing PEG tube feeding. -We will have to keep encouraging the patient to eat better.  Generalized deconditioning History of stroke/severe debility -Aspirin and Plavix on hold. -Overall prognosis is guarded to poor.  Will request palliative care evaluation.  Spoke to daughter/[name] over the phone on 08/12/2018 and gave updates. -PT/OT eval  CAD -Stable.  Aspirin/Plavix on hold  Diabetes mellitus type II -Blood sugars stable.  Acute hypoxic respiratory failure COPD -Was briefly intubated for procedure. -Resolved. -Currently on room air. -Continue PRN bronchodilators and pulmonary toiletry.  Chronic diastolic CHF -Volume managed by dialysis.    DVT prophylaxis: SCDs Code Status:  Full Family Communication: Spoke to daughter/[name] over the phone on 08/12/2018 Disposition Plan: Will depend on clinical outcome and PT/OT evaluation.  Might need to have placement.  Consultants: PCCM/nephrology/GI/IR  Procedures:  EGD 08/03/2018-08/04/2018  EGD on 08/03/2018 which showed duodenal AVM which was injected and treated with gold probe.   Left thoracentesis on 08/05/2018 Left chest tube placement on 08/05/2018 and removal on 08/09/2018 Left IJ central venous catheter placement on 08/07/2018 -Status post  colonoscopy on 08/07/2018 which showed old blood in the entire colon, no active bleeding noted on lavage, few polyps noted but not removed and terminal ileum showed old blood as well.  She had repeat EGD on 08/07/2018 which showed small amount of blood in fundus, duodenal ulcer and visible vessel without active bleeding for which one Endo Clip was placed.  Antimicrobials:  None  Subjective: Patient seen and examined at bedside.  She is sleepy, wakes up  only very slightly on  calling her name.  Hardly answers any questions.  No overnight black or bloody stools reported by nursing staff.  Objective: Vitals:   08/12/18 0030 08/12/18 0446 08/12/18 0500 08/12/18 0724  BP:  (!) 107/49    Pulse:  64    Resp:  19    Temp: 98.8 F (37.1 C)  98.6 F (37 C)   TempSrc: Oral     SpO2:  98%    Weight:    40.5 kg  Height:        Intake/Output Summary (Last 24 hours) at 08/12/2018 1055 Last data filed at 08/11/2018 1700 Gross per 24 hour  Intake 436.43 ml  Output 0 ml  Net 436.43 ml   Filed Weights   08/10/18 1320 08/10/18 1726 08/12/18 0724  Weight: 31 kg 31 kg 40.5 kg    Examination:  General exam: Appears calm and comfortable.  Looks chronically ill.  Has NG tube in place.  Extremely thinly built Respiratory system: Bilateral decreased breath sounds at bases with scattered crackles Cardiovascular system: S1 & S2 heard, Rate controlled Gastrointestinal system: Abdomen is nondistended, soft and nontender. Normal bowel sounds heard. Extremities: No cyanosis, clubbing; trace edema  central nervous system: Sleepy, hardly wakes up. No focal neurological deficits. Moving extremities Skin: No rashes, lesions or ulcers Psychiatry: Could not be assessed because patient is sleepy    Data Reviewed: I have personally reviewed following labs and imaging studies  CBC: Recent Labs  Lab 08/08/18 0430  08/09/18 0423  08/10/18 0401  08/10/18 1959 08/11/18 0430 08/11/18 0830 08/11/18 2129 08/12/18 0401  WBC 10.9*  --  8.1  --  6.3  --   --  11.3*  --   --  10.0  NEUTROABS  --   --   --   --   --   --   --  10.3*  --   --   --   HGB 8.1*   < > 7.4*   < > 6.4*   < > 11.5* 11.0* 11.0* 11.8* 10.7*  HCT 23.7*   < > 22.6*   < > 20.0*   < > 35.0* 33.8* 33.4* 36.8 32.9*  MCV 86.8  --  90.0  --  93.9  --   --  91.8  --   --  92.7  PLT 115*  --  109*  --  76*  --   --  71*  --   --  81*   < > = values in this interval not displayed.   Basic Metabolic Panel: Recent  Labs  Lab 08/06/18 0836  08/08/18 0430 08/08/18 1325 08/09/18 0423 08/10/18 0401 08/11/18 0430 08/12/18 0401  NA 136   < > 134*  --  135 136 139 136  K 4.3   < > 4.1  --  3.4* 3.3* 3.8 4.5  CL 102   < > 103  --  103 107 107 106  CO2 22   < > 22  --  25 24 26 23   GLUCOSE 94   < > 109*  --  109* 383* 97 137*  BUN 36*   < > 57*  --  20 23 7* 18  CREATININE 3.13*   < > 4.33*  --  2.29* 3.10* 2.09* 2.80*  CALCIUM 7.5*   < > 7.5* 7.0* 7.3* 6.8* 7.1* 6.6*  MG 1.7  --   --   --   --  1.5* 2.0 2.0  PHOS 2.9  --   --   --  2.2* 4.1 2.0* 4.1   < > = values in this interval not displayed.   GFR: Estimated Creatinine Clearance: 10.8 mL/min (A) (by C-G formula based on SCr of 2.8 mg/dL (H)). Liver Function Tests: Recent Labs  Lab 08/05/18 1550 08/09/18 0423 08/10/18 0401 08/11/18 0430 08/12/18 0401  PROT 4.7*  --   --   --   --   ALBUMIN  --  1.9* 1.4* 1.8* 1.7*   No results for input(s): LIPASE, AMYLASE in the last 168 hours. No results for input(s): AMMONIA in the last 168 hours. Coagulation Profile: No results for input(s): INR, PROTIME in the last 168 hours. Cardiac Enzymes: No results for input(s): CKTOTAL, CKMB, CKMBINDEX, TROPONINI in the last 168 hours. BNP (last 3 results) No results for input(s): PROBNP in the last 8760 hours. HbA1C: No results for input(s): HGBA1C in the last 72 hours. CBG: Recent Labs  Lab 08/11/18 1517 08/11/18 2134 08/12/18 0025 08/12/18 0459 08/12/18 0809  GLUCAP 90 102* 84 103* 125*   Lipid Profile: No results for input(s): CHOL, HDL, LDLCALC, TRIG, CHOLHDL, LDLDIRECT in the last 72 hours. Thyroid Function Tests: No results for input(s): TSH, T4TOTAL, FREET4, T3FREE, THYROIDAB in the last 72 hours. Anemia Panel: No results for input(s): VITAMINB12, FOLATE, FERRITIN, TIBC, IRON, RETICCTPCT in the last 72 hours. Sepsis Labs: No results for input(s): PROCALCITON, LATICACIDVEN in the last 168 hours.  Recent Results (from the past 240  hour(s))  Body fluid culture (includes gram stain)     Status: None   Collection Time: 08/05/18  2:00 PM   Specimen: Pleural Fluid  Result Value Ref Range Status   Specimen Description FLUID PLEURAL LEFT  Final   Special Requests NONE  Final   Gram Stain NO WBC SEEN NO ORGANISMS SEEN   Final   Culture   Final    NO GROWTH 3 DAYS Performed at [facility], [address] [place], [place] [address]    Report Status 08/08/2018 FINAL  Final         Radiology Studies: Dg Abd 1 View  Result Date: 08/11/2018 CLINICAL DATA:  Evaluate nasogastric tube placement. EXAM: ABDOMEN - 1 VIEW COMPARISON:  Chest radiograph 08/10/2018 FINDINGS: Nasogastric tube extends into the abdomen and appears to extend through the stomach. The tip is in the expected region of the proximal duodenum. Surgical hardware in the right hip and proximal right femur. There is a central line tip near the lower SVC. Densities at both lung bases compatible with bilateral pleural effusions, left side greater than right. Nonobstructive bowel gas pattern. Extensive vascular calcifications in the abdomen. IMPRESSION: Tip of the nasogastric tube is likely in the proximal duodenum. Electronically Signed   By: Markus Daft M.D.   On: 08/11/2018 11:53        Scheduled Meds: . Chlorhexidine Gluconate Cloth  6 each Topical Q0600  . darbepoetin (ARANESP) injection - DIALYSIS  200 mcg Intravenous Q Fri-HD  . escitalopram  5 mg Oral Daily  . feeding supplement (NEPRO CARB STEADY)  237 mL Oral BID BM  . midodrine  10 mg Oral TID WC  . multivitamin  1 tablet Oral QHS  . Pancrealipase (Lip-Prot-Amyl) CPEP 6,000 units  1 capsule Oral TID WC  . pantoprazole  40 mg Oral BID  . sodium chloride flush  10-40 mL Intracatheter Q12H   Continuous Infusions: . sodium chloride    . sodium chloride    . feeding supplement (OSMOLITE 1.5 CAL) 40 mL/hr at 08/12/18 0133     LOS: 13 days        Aline August, MD [facility]  08/12/2018, 10:55 AM

## 2018-08-12 NOTE — Progress Notes (Signed)
Daughter Park Liter called, updated and placed on phone with patient.

## 2018-08-13 ENCOUNTER — Inpatient Hospital Stay (HOSPITAL_COMMUNITY): Payer: Medicare Other

## 2018-08-13 LAB — RENAL FUNCTION PANEL
Albumin: 1.6 g/dL — ABNORMAL LOW (ref 3.5–5.0)
Anion gap: 10 (ref 5–15)
BUN: 9 mg/dL (ref 8–23)
CO2: 24 mmol/L (ref 22–32)
Calcium: 6.9 mg/dL — ABNORMAL LOW (ref 8.9–10.3)
Chloride: 104 mmol/L (ref 98–111)
Creatinine, Ser: 1.62 mg/dL — ABNORMAL HIGH (ref 0.44–1.00)
GFR calc Af Amer: 35 mL/min — ABNORMAL LOW (ref >60)
GFR calc non Af Amer: 30 mL/min — ABNORMAL LOW (ref >60)
Glucose, Bld: 90 mg/dL (ref 70–99)
Phosphorus: 2.6 mg/dL (ref 2.5–4.6)
Potassium: 4.2 mmol/L (ref 3.5–5.1)
Sodium: 138 mmol/L (ref 135–145)

## 2018-08-13 LAB — GLUCOSE, CAPILLARY
Glucose-Capillary: 110 mg/dL — ABNORMAL HIGH (ref 70–99)
Glucose-Capillary: 129 mg/dL — ABNORMAL HIGH (ref 70–99)
Glucose-Capillary: 141 mg/dL — ABNORMAL HIGH (ref 70–99)
Glucose-Capillary: 149 mg/dL — ABNORMAL HIGH (ref 70–99)
Glucose-Capillary: 69 mg/dL — ABNORMAL LOW (ref 70–99)
Glucose-Capillary: 80 mg/dL (ref 70–99)
Glucose-Capillary: 95 mg/dL (ref 70–99)

## 2018-08-13 MED ORDER — SODIUM CHLORIDE 0.9 % IV SOLN: 100.0000 mL | INTRAVENOUS | Status: DC | PRN

## 2018-08-13 MED ORDER — HEPARIN SODIUM (PORCINE) 1000 UNIT/ML DIALYSIS: 1000.0000 [IU] | INTRAMUSCULAR | Status: DC | PRN

## 2018-08-13 MED ORDER — LIDOCAINE-PRILOCAINE 2.5-2.5 % EX CREA: 1.0000 "application " | TOPICAL_CREAM | CUTANEOUS | Status: DC | PRN

## 2018-08-13 MED ORDER — LIDOCAINE HCL (PF) 1 % IJ SOLN: 5.0000 mL | INTRAMUSCULAR | Status: DC | PRN

## 2018-08-13 MED ORDER — DARBEPOETIN ALFA 200 MCG/0.4ML IJ SOSY
PREFILLED_SYRINGE | INTRAMUSCULAR | Status: AC
  Filled 2018-08-13: qty 0.4

## 2018-08-13 MED ORDER — PENTAFLUOROPROP-TETRAFLUOROETH EX AERO: 1.0000 "application " | INHALATION_SPRAY | CUTANEOUS | Status: DC | PRN

## 2018-08-13 NOTE — Progress Notes (Signed)
Pt daughter  called for update

## 2018-08-13 NOTE — Progress Notes (Signed)
Pt daughter brought home med creon 6000 unit cap missed  dosage scheduled at 1700 was given at 2149 med sent to pharmacy to be stored

## 2018-08-13 NOTE — Progress Notes (Signed)
Daughter called and updated. Requested to bring  medication creon from home per pharmacist request.

## 2018-08-13 NOTE — Progress Notes (Addendum)
Called [phone] [facility] per NGT removal during Dialysis.  Service will contact Dr. Cristina Gong for a response.  [facility] has signed off.  I will contact IM ([facility]).  Messaged [facility] @ 06:34  Dr Starla Link was messaged through [facility] at 07:46.

## 2018-08-13 NOTE — Progress Notes (Signed)
[facility]    NEPHROLOGY PROGRESS NOTE  SUBJECTIVE: Patient seen and examined.  Patient resting on examination.  The patient provides no acute complaints.  Review of chart reveals a negative 10 point review of systems.  Had dialysis early this morning.   OBJECTIVE:  Vitals:   08/13/18 1151 08/13/18 1330  BP: (!) 105/38 (!) 108/38  Pulse: 62   Resp: 14   Temp:  98.9 F (37.2 C)  SpO2: 93%     Intake/Output Summary (Last 24 hours) at 08/13/2018 1344 Last data filed at 08/13/2018 1100 Gross per 24 hour  Intake 1419.33 ml  Output 1747 ml  Net -327.67 ml      General: Resting, NAD HEENT: MMM [place] AT anicteric sclera Neck:  No JVD, no adenopathy CV:  Heart RRR  Lungs:  L/S CTA bilaterally Abd:  abd SNT/ND with normal BS GU:  Bladder non-palpable Extremities:  No LE edema. Skin:  No skin rash  MEDICATIONS:  . calcitRIOL  0.25 mcg Oral Daily  . Chlorhexidine Gluconate Cloth  6 each Topical Q0600  . darbepoetin (ARANESP) injection - DIALYSIS  200 mcg Intravenous Q Fri-HD  . escitalopram  5 mg Oral Daily  . feeding supplement (NEPRO CARB STEADY)  237 mL Oral BID BM  . midodrine  10 mg Oral TID WC  . multivitamin  1 tablet Oral QHS  . Pancrealipase (Lip-Prot-Amyl) CPEP 6,000 units  1 capsule Oral TID WC  . pantoprazole  40 mg Oral BID  . sodium chloride flush  10-40 mL Intracatheter Q12H       LABS:   CBC Latest Ref Rng & Units 08/12/2018 08/12/2018 08/12/2018  WBC 4.0 - 10.5 K/uL - - 10.0  Hemoglobin 12.0 - 15.0 g/dL 11.2(L) 11.8(L) 10.7(L)  Hematocrit 36.0 - 46.0 % 35.0(L) 36.5 32.9(L)  Platelets 150 - 400 K/uL - - 81(L)    CMP Latest Ref Rng & Units 08/13/2018 08/12/2018 08/11/2018  Glucose 70 - 99 mg/dL 90 137(H) 97  BUN 8 - 23 mg/dL 9 18 7(L)  Creatinine 0.44 - 1.00 mg/dL 1.62(H) 2.80(H) 2.09(H)  Sodium 135 - 145 mmol/L 138 136 139  Potassium 3.5 - 5.1 mmol/L 4.2 4.5 3.8  Chloride 98 - 111 mmol/L 104 106 107  CO2 22 - 32 mmol/L 24 23 26   Calcium  8.9 - 10.3 mg/dL 6.9(L) 6.6(L) 7.1(L)  Total Protein 6.5 - 8.1 g/dL - - -  Total Bilirubin 0.3 - 1.2 mg/dL - - -  Alkaline Phos 38 - 126 U/L - - -  AST 15 - 41 U/L - - -  ALT 0 - 44 U/L - - -    Lab Results  Component Value Date   PTH 70 (H) 08/08/2018   PTH Comment 08/08/2018   CALCIUM 6.9 (L) 08/13/2018   CAION 1.05 (L) 08/03/2018   PHOS 2.6 08/13/2018       Component Value Date/Time   COLORURINE YELLOW 07/17/2012 1751   APPEARANCEUR CLOUDY (A) 07/17/2012 1751   LABSPEC 1.020 07/17/2012 1751   PHURINE 5.5 07/17/2012 1751   GLUCOSEU NEGATIVE 07/17/2012 1751   HGBUR SMALL (A) 07/17/2012 1751   BILIRUBINUR NEGATIVE 07/17/2012 1751   KETONESUR NEGATIVE 07/17/2012 1751   PROTEINUR 30 (A) 07/17/2012 1751   UROBILINOGEN 0.2 07/17/2012 1751   NITRITE NEGATIVE 07/17/2012 1751   LEUKOCYTESUR SMALL (A) 07/17/2012 1751      Component Value Date/Time   PHART 7.396 08/04/2018 1345   PCO2ART 33.6 08/04/2018 1345   PO2ART 182 (  H) 08/04/2018 1345   HCO3 20.2 08/04/2018 1345   TCO2 23 08/03/2018 2248   ACIDBASEDEF 3.8 (H) 08/04/2018 1345   O2SAT 99.4 08/04/2018 1345       Component Value Date/Time   IRON 111 07/31/2018 0336   TIBC 136 (L) 07/31/2018 0336   FERRITIN 1,324 (H) 07/31/2018 0336   IRONPCTSAT 82 (H) 07/31/2018 0336       ASSESSMENT/PLAN:    78 year old female patient with a past medical history significant for chronic diastolic heart failure, coronary artery disease, type 2 diabetes, hypertension, hyperlipidemia, stroke and end-stage renal disease on hemodialysis Monday, Wednesday, and Friday who presented on 07/30/2018 with altered mental status and anemia with a hemoglobin of 5.7.  An EGD revealed an AVM.  She also underwent a colonoscopy and repeat EGD.  1.  End-stage renal disease on hemodialysis.  We will continue dialysis on Monday, Wednesday, and Friday.  2.  Acute blood loss anemia.  Hemoglobin is stable and at goal.  3.  Secondary hyperparathyroidism.  PTH  was 70.  Phosphorus stable at 4.1.  Will add calcitriol given markedly low calcium level.  Vitamin D level pending.   [place], DO, [place]

## 2018-08-13 NOTE — Progress Notes (Signed)
OT Cancellation Note  Patient Details Name: Courtney [name] MRN: [id number] DOB: 1941/02/14   Cancelled Treatment:    Reason Eval/Treat Not Completed: Fatigue/lethargy limiting ability to participate(low BP). Pt supine in bed and very lethargic, responding only once or twice to therapist's questions but difficult to understand. Therapist took BP reading while pt supine in bed, 97/39.  Took BP a second time which read 105/38.  Will hold therapy this morning and check back later today as time allows. RN made aware.   Darrol Jump OTR/L 08/13/2018, 12:01 PM

## 2018-08-13 NOTE — Progress Notes (Signed)
Received verbal order to replace NGT tube. Same done. X-ray done to confirm position.

## 2018-08-13 NOTE — Progress Notes (Signed)
Daughter [name] Wiest updated.

## 2018-08-13 NOTE — Progress Notes (Signed)
PT Cancellation Note  Patient Details Name: Courtney [name] MRN: [id number] DOB: 09-Apr-1940   Cancelled Treatment:      Reason Eval/Treat Not Completed: Fatigue/lethargy limiting ability to participate(low BP). Pt supine in bed and very lethargic, responding only once or twice to therapist's questions but difficult to understand. Therapist took BP reading while pt supine in bed, 97/39.  Took BP a second time which read 105/38.  Will hold therapy this morning and check back later today as time allows. RN made aware.   Carney Living PT DPT  08/13/2018, 2:03 PM

## 2018-08-13 NOTE — Progress Notes (Signed)
Pt returned from HD.

## 2018-08-13 NOTE — Progress Notes (Signed)
Patient ID: Courtney [name], female   DOB: Jan 05, 1941, 78 y.o.   MRN: [id number]  PROGRESS NOTE    Courtney [name]  OZH:[id number] DOB: March 31, 1940 DOA: 07/30/2018 PCP: Neale Burly, MD   Brief Narrative:  78 year old female with history of end-stage renal disease on hemodialysis, chronic diastolic heart failure, CAD, diabetes mellitus type 2, hypertension, hyperlipidemia, stroke presented on 07/30/2018 with altered mental status and hemoglobin of 5.7.  GI and nephrology were consulted.  She was transfused 2 units packed red cells.  CT of the abdomen and pelvis showed small amount of gas in the endometrial canal of uncertain etiology with bilateral pleural effusions.  This was discussed with GYN on-call who did not recommend any further work-up.  Patient has had a long hospitalization.  Initially there was no plans for any endoscopic procedures but patient continued to have intermittent blood in stools.  She required more packed red cell transfusion.  Patient was transferred to ICU on 08/03/2018.  Tagged red cell study was positive with concern for proximal bleeding source such as duodenum or stomach.  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  Patient also underwent left-sided thoracentesis on 08/05/2018 for left pleural effusion and subsequently developed pneumothorax for which chest tube was placed.  Patient again had capsule bleeding on 08/06/2018 and hemoglobin had dropped to 6.7 from 7.7.  She was subsequently transfused packed red cells again.  She underwent colonoscopy on 08/07/2018 which showed old blood in the entire colon, no active bleeding noted on lavage, few polyps noted but not removed and terminal ileum showed old blood as well.  She had repeat EGD on 08/07/2018 which showed small amount of blood in fundus, duodenal ulcer and visible vessel without active bleeding for which one Endo Clip was placed.  Patient was continued on Protonix  drip.  Small bowel PillCam was placed.  Chest tube was removed on 08/09/2018. subsequently, her oral intake has been very poor.  NG tube feeding has been started.  She was transferred back to [facility] service on 08/12/2018.  Assessment & Plan:  Acute on chronic blood loss anemia Upper GI bleeding most likely duodenal bleed -Patient presented with hemoglobin of 5.7.  Has had 7 units of packed red cells transfusion since admission, last transfusion was on 08/10/2018. -Hemoglobin 11.2 last night.  No overnight black or bloody stools -Tagged red cell study was positive with concern for proximal bleeding source such as duodenum or stomach. -Status post EGD on 08/03/2018 which showed duodenal AVM which was injected and treated with gold probe.   -Status post  colonoscopy on 08/07/2018 which showed old blood in the entire colon, no active bleeding noted on lavage, few polyps noted but not removed and terminal ileum showed old blood as well.  She had repeat EGD on 08/07/2018 which showed small amount of blood in fundus, duodenal ulcer and visible vessel without active bleeding for which one Endo Clip was placed. -Continue twice a day Protonix.  GI recommends PPI twice a day for at least 4 weeks thereafter once a day indefinitely.  Aspirin and Plavix on hold.  If absolutely needed, GI recommends aspirin 81 mg only, possibly after a week.  GI has signed off.  Outpatient follow-up with GI.  Iatrogenic pneumothorax -Resolved.  Status post removal of chest tube on 08/09/2018.  PCCM has signed off  End-stage renal disease on hemodialysis Hypotension -Dialysis as per nephrology schedule -Blood pressure on  the lower side but stable.  Continue midodrine.  Recurrent pleural effusions -Status post left thoracentesis with subsequent pneumothorax and chest tube placement on 08/05/2018.  Continue volume removal as per HD.  Defer further thoracentesis unless patient is in severe respiratory  distress  Malnutrition Hypoalbuminemia -Patient continues to refuse to eat.  Patient apparently pulled out her NG tube earlier this morning.  NG tube has subsequently been replaced.   -Patient/family not interested in pursuing PEG tube feeding. -We will have to keep encouraging the patient to eat better.  Generalized deconditioning History of stroke/severe debility -Aspirin and Plavix on hold. -Overall prognosis is guarded to poor.  Will request palliative care evaluation.  Spoke to daughter/[name] over the phone on 08/12/2018 and gave updates. -PT/OT eval  CAD -Stable.  Aspirin/Plavix on hold  Diabetes mellitus type II -Blood sugars on the lower side.  Acute hypoxic respiratory failure COPD -Was briefly intubated for procedure. -Resolved. -Currently on room air. -Continue PRN bronchodilators and pulmonary toiletry.  Chronic diastolic CHF -Volume managed by dialysis.    DVT prophylaxis: SCDs Code Status:  Full Family Communication: Spoke to daughter/[name] over the phone on 08/12/2018 Disposition Plan: Will depend on clinical outcome and PT/OT evaluation.  Might need to have placement.  Consultants: PCCM/nephrology/GI/IR  Procedures:  EGD 08/03/2018-08/04/2018  EGD on 08/03/2018 which showed duodenal AVM which was injected and treated with gold probe.   Left thoracentesis on 08/05/2018 Left chest tube placement on 08/05/2018 and removal on 08/09/2018 Left IJ central venous catheter placement on 08/07/2018 -Status post  colonoscopy on 08/07/2018 which showed old blood in the entire colon, no active bleeding noted on lavage, few polyps noted but not removed and terminal ileum showed old blood as well.  She had repeat EGD on 08/07/2018 which showed small amount of blood in fundus, duodenal ulcer and visible vessel without active bleeding for which one Endo Clip was placed.  Antimicrobials:  None  Subjective: Patient seen and examined at bedside.  She is more awake, hardly  answers any questions.  No overnight fever or vomiting reported.  Apparently pulled out her NG tube earlier this morning as per nursing staff which has since been placed. Objective: Vitals:   08/13/18 0400 08/13/18 0430 08/13/18 0445 08/13/18 0510  BP: (!) 101/49 (!) 129/52 (!) 86/38 (!) 128/54  Pulse: 63 73 65 69  Resp: 18 18 17 14   Temp:    97.7 F (36.5 C)  TempSrc:    Oral  SpO2:    96%  Weight:    36.8 kg  Height:        Intake/Output Summary (Last 24 hours) at 08/13/2018 0747 Last data filed at 08/13/2018 0510 Gross per 24 hour  Intake 53 ml  Output 1747 ml  Net -1694 ml   Filed Weights   08/12/18 0724 08/13/18 0100 08/13/18 0510  Weight: 40.5 kg 38.6 kg 36.8 kg    Examination:  General exam: No acute distress.  Looks chronically ill.  Has NG tube in place.  Extremely thinly built.  Awake but hardly answers any questions and does not participate in any conversation. Respiratory system: Bilateral decreased breath sounds at bases with scattered crackles mostly in the bases with no wheezing Cardiovascular system: Rate controlled, S1-S2 heard Gastrointestinal system: Abdomen is nondistended, soft and nontender. Normal bowel sounds heard. Extremities: No cyanosis; trace edema   Data Reviewed: I have personally reviewed following labs and imaging studies  CBC: Recent Labs  Lab 08/08/18 0430  08/09/18 0423  08/10/18  0401  08/11/18 0430 08/11/18 0830 08/11/18 2129 08/12/18 0401 08/12/18 1103 08/12/18 2155  WBC 10.9*  --  8.1  --  6.3  --  11.3*  --   --  10.0  --   --   NEUTROABS  --   --   --   --   --   --  10.3*  --   --   --   --   --   HGB 8.1*   < > 7.4*   < > 6.4*   < > 11.0* 11.0* 11.8* 10.7* 11.8* 11.2*  HCT 23.7*   < > 22.6*   < > 20.0*   < > 33.8* 33.4* 36.8 32.9* 36.5 35.0*  MCV 86.8  --  90.0  --  93.9  --  91.8  --   --  92.7  --   --   PLT 115*  --  109*  --  76*  --  71*  --   --  81*  --   --    < > = values in this interval not displayed.   Basic  Metabolic Panel: Recent Labs  Lab 08/06/18 0836  08/08/18 0430 08/08/18 1325 08/09/18 0423 08/10/18 0401 08/11/18 0430 08/12/18 0401  NA 136   < > 134*  --  135 136 139 136  K 4.3   < > 4.1  --  3.4* 3.3* 3.8 4.5  CL 102   < > 103  --  103 107 107 106  CO2 22   < > 22  --  25 24 26 23   GLUCOSE 94   < > 109*  --  109* 383* 97 137*  BUN 36*   < > 57*  --  20 23 7* 18  CREATININE 3.13*   < > 4.33*  --  2.29* 3.10* 2.09* 2.80*  CALCIUM 7.5*   < > 7.5* 7.0* 7.3* 6.8* 7.1* 6.6*  MG 1.7  --   --   --   --  1.5* 2.0 2.0  PHOS 2.9  --   --   --  2.2* 4.1 2.0* 4.1   < > = values in this interval not displayed.   GFR: Estimated Creatinine Clearance: 9.8 mL/min (A) (by C-G formula based on SCr of 2.8 mg/dL (H)). Liver Function Tests: Recent Labs  Lab 08/09/18 0423 08/10/18 0401 08/11/18 0430 08/12/18 0401  ALBUMIN 1.9* 1.4* 1.8* 1.7*   No results for input(s): LIPASE, AMYLASE in the last 168 hours. No results for input(s): AMMONIA in the last 168 hours. Coagulation Profile: No results for input(s): INR, PROTIME in the last 168 hours. Cardiac Enzymes: No results for input(s): CKTOTAL, CKMB, CKMBINDEX, TROPONINI in the last 168 hours. BNP (last 3 results) No results for input(s): PROBNP in the last 8760 hours. HbA1C: No results for input(s): HGBA1C in the last 72 hours. CBG: Recent Labs  Lab 08/12/18 1636 08/12/18 2012 08/12/18 2351 08/13/18 0418 08/13/18 0602  GLUCAP 119* 106* 98 95 80   Lipid Profile: No results for input(s): CHOL, HDL, LDLCALC, TRIG, CHOLHDL, LDLDIRECT in the last 72 hours. Thyroid Function Tests: No results for input(s): TSH, T4TOTAL, FREET4, T3FREE, THYROIDAB in the last 72 hours. Anemia Panel: No results for input(s): VITAMINB12, FOLATE, FERRITIN, TIBC, IRON, RETICCTPCT in the last 72 hours. Sepsis Labs: No results for input(s): PROCALCITON, LATICACIDVEN in the last 168 hours.  Recent Results (from the past 240 hour(s))  Body fluid culture  (includes gram stain)  Status: None   Collection Time: 08/05/18  2:00 PM   Specimen: Pleural Fluid  Result Value Ref Range Status   Specimen Description FLUID PLEURAL LEFT  Final   Special Requests NONE  Final   Gram Stain NO WBC SEEN NO ORGANISMS SEEN   Final   Culture   Final    NO GROWTH 3 DAYS Performed at [facility], [address], [place], [place] [address]    Report Status 08/08/2018 FINAL  Final         Radiology Studies: Dg Abd 1 View  Result Date: 08/11/2018 CLINICAL DATA:  Evaluate nasogastric tube placement. EXAM: ABDOMEN - 1 VIEW COMPARISON:  Chest radiograph 08/10/2018 FINDINGS: Nasogastric tube extends into the abdomen and appears to extend through the stomach. The tip is in the expected region of the proximal duodenum. Surgical hardware in the right hip and proximal right femur. There is a central line tip near the lower SVC. Densities at both lung bases compatible with bilateral pleural effusions, left side greater than right. Nonobstructive bowel gas pattern. Extensive vascular calcifications in the abdomen. IMPRESSION: Tip of the nasogastric tube is likely in the proximal duodenum. Electronically Signed   By: Markus Daft M.D.   On: 08/11/2018 11:53        Scheduled Meds:  calcitRIOL  0.25 mcg Oral Daily   Chlorhexidine Gluconate Cloth  6 each Topical Q0600   darbepoetin (ARANESP) injection - DIALYSIS  200 mcg Intravenous Q Fri-HD   escitalopram  5 mg Oral Daily   feeding supplement (NEPRO CARB STEADY)  237 mL Oral BID BM   midodrine  10 mg Oral TID WC   multivitamin  1 tablet Oral QHS   Pancrealipase (Lip-Prot-Amyl) CPEP 6,000 units  1 capsule Oral TID WC   pantoprazole  40 mg Oral BID   sodium chloride flush  10-40 mL Intracatheter Q12H   Continuous Infusions:  feeding supplement (OSMOLITE 1.5 CAL) 40 mL/hr at 08/12/18 0133     LOS: 14 days        Aline August, MD [facility] 08/13/2018, 7:47 AM

## 2018-08-14 ENCOUNTER — Inpatient Hospital Stay (HOSPITAL_COMMUNITY): Payer: Medicare Other

## 2018-08-14 DIAGNOSIS — Z7189 Other specified counseling: Secondary | ICD-10-CM

## 2018-08-14 DIAGNOSIS — K264 Chronic or unspecified duodenal ulcer with hemorrhage: Principal | ICD-10-CM

## 2018-08-14 DIAGNOSIS — E43 Unspecified severe protein-calorie malnutrition: Secondary | ICD-10-CM

## 2018-08-14 DIAGNOSIS — Z515 Encounter for palliative care: Secondary | ICD-10-CM

## 2018-08-14 LAB — GLUCOSE, CAPILLARY
Glucose-Capillary: 131 mg/dL — ABNORMAL HIGH (ref 70–99)
Glucose-Capillary: 113 mg/dL — ABNORMAL HIGH (ref 70–99)
Glucose-Capillary: 127 mg/dL — ABNORMAL HIGH (ref 70–99)
Glucose-Capillary: 144 mg/dL — ABNORMAL HIGH (ref 70–99)
Glucose-Capillary: 88 mg/dL (ref 70–99)

## 2018-08-14 LAB — RENAL FUNCTION PANEL
Albumin: 1.6 g/dL — ABNORMAL LOW (ref 3.5–5.0)
Anion gap: 6 (ref 5–15)
BUN: 20 mg/dL (ref 8–23)
CO2: 26 mmol/L (ref 22–32)
Calcium: 7 mg/dL — ABNORMAL LOW (ref 8.9–10.3)
Chloride: 103 mmol/L (ref 98–111)
Creatinine, Ser: 2.49 mg/dL — ABNORMAL HIGH (ref 0.44–1.00)
GFR calc Af Amer: 21 mL/min — ABNORMAL LOW (ref >60)
GFR calc non Af Amer: 18 mL/min — ABNORMAL LOW (ref >60)
Glucose, Bld: 156 mg/dL — ABNORMAL HIGH (ref 70–99)
Phosphorus: 2.1 mg/dL — ABNORMAL LOW (ref 2.5–4.6)
Potassium: 4.8 mmol/L (ref 3.5–5.1)
Sodium: 135 mmol/L (ref 135–145)

## 2018-08-14 NOTE — Evaluation (Signed)
Physical Therapy Evaluation Patient Details Name: Courtney [name] MRN: [id number] DOB: 04-Feb-1941 Today's Date: 08/14/2018   History of Present Illness  Pt is a 78 yr old female with history of end stage end stage renal disease. Pt has had extensive stay with Acute on chronic blood loss anemia and has had 7 units of RBC.  PMH: CHF, CAD, stroke (2-3 yrs) MI, HTN, Hypertention, DM   Clinical Impression  Pt is very weak and deconditioned.  BPs are soft, but increase mildly as she mobilizes without reports of lightheadedness or signs of distress.  She attempts to help move in the bed and support herself in sitting, but overall is max to total assist for mobility.  She is appropriate for SNF placement at discharge.   PT to follow acutely for deficits listed below.      Follow Up Recommendations SNF    Equipment Recommendations  Hospital bed;Other (comment)(air mattress overlay)    Recommendations for Other Services   NA    Precautions / Restrictions Precautions Precautions: Fall Precaution Comments: R sided weakness, NGT for feeding, sacral and ischial wounds      Mobility  Bed Mobility Overal bed mobility: Needs Assistance Bed Mobility: Rolling;Sidelying to Sit Rolling: Max assist;+2 for physical assistance Sidelying to sit: Max assist;+2 for physical assistance       General bed mobility comments: Two person max assist to roll several times for peri care, pt attempting to reach with L arm and move left leg, but most assist provided by therapists.    Transfers Overall transfer level: Needs assistance   Transfers: Squat Pivot Transfers     Squat pivot transfers: +2 physical assistance;Total assist;From elevated surface     General transfer comment: Two person total assist to squat pivot to the left from high bed to recliner chair.    Ambulation/Gait             General Gait Details: unable at this time.       Balance Overall balance assessment: Needs  assistance Sitting-balance support: Feet supported;Single extremity supported Sitting balance-Leahy Scale: Zero Sitting balance - Comments: Max assist EOB due to R lateral lean, pt is able to elicit some trunkal correction with cues, but fatigues quickly requiring max assist EOB to maintain upright, midline posture.   Postural control: Right lateral lean Standing balance support: Single extremity supported Standing balance-Leahy Scale: Zero Standing balance comment: total assist for partial stand (squat) to chair.                              Pertinent Vitals/Pain Pain Assessment: Faces Faces Pain Scale: Hurts even more Pain Location: "everywhere" Pain Descriptors / Indicators: Grimacing;Guarding Pain Intervention(s): Limited activity within patient's tolerance;Monitored during session;Repositioned    Home Living Family/patient expects to be discharged to:: Private residence Living Arrangements: Children               Additional Comments: Not sure if pt is giving accurate hx.     Prior Function           Comments: Unsure due to poor historian        Extremity/Trunk Assessment   Upper Extremity Assessment Upper Extremity Assessment: Defer to OT evaluation    Lower Extremity Assessment Lower Extremity Assessment: RLE deficits/detail;LLE deficits/detail RLE Deficits / Details: Right leg is her weaker leg due to PMH of stroke with R UE/LE weakness, I was unable to elicit movement to command  on this side.  LLE Deficits / Details: left leg weak against gravity movement, grossly 2+ to 3-/5 per bed level and EOB assessment.     Cervical / Trunk Assessment Cervical / Trunk Assessment: Kyphotic  Communication   Communication: Other (comment)(soft voice, low tone, mumbles)  Cognition Arousal/Alertness: Awake/alert Behavior During Therapy: Flat affect Overall Cognitive Status: Impaired/Different from baseline Area of Impairment:  Orientation;Attention;Safety/judgement;Following commands;Memory;Awareness;Problem solving                 Orientation Level: Disoriented to;Place;Time;Situation Current Attention Level: Sustained Memory: Decreased recall of precautions;Decreased short-term memory Following Commands: Follows one step commands with increased time Safety/Judgement: Decreased awareness of safety;Decreased awareness of deficits Awareness: Intellectual Problem Solving: Slow processing;Decreased initiation;Difficulty sequencing;Requires verbal cues;Requires tactile cues General Comments: Pt generally able to follow basic commands to the best of her physical ability with mild delay in processing.  She has difficutly with orientation and history questions.        General Comments General comments (skin integrity, edema, etc.): BPs soft, but stable, see vitals flow sheet.  They came up mildly as we progressed to [place].  HR and O2 sats stable on RA throughout.  Some PVCs noted, but no acute distress.         Assessment/Plan    PT Assessment Patient needs continued PT services  PT Problem List Decreased strength;Decreased activity tolerance;Decreased balance;Decreased mobility;Decreased knowledge of use of DME;Pain;Decreased skin integrity;Impaired sensation;Decreased cognition       PT Treatment Interventions DME instruction;Functional mobility training;Therapeutic activities;Therapeutic exercise;Balance training;Cognitive remediation;Patient/family education;Wheelchair mobility training    PT Goals (Current goals can be found in the Care Plan section)  Acute Rehab PT Goals Patient Stated Goal: unable to state, agreeable she would like to get OOB PT Goal Formulation: Patient unable to participate in goal setting Time For Goal Achievement: 08/28/18 Potential to Achieve Goals: Good    Frequency Min 2X/week        Co-evaluation PT/OT/SLP Co-Evaluation/Treatment: Yes Reason for Co-Treatment:  Complexity of the patient's impairments (multi-system involvement);For patient/therapist safety;Necessary to address cognition/behavior during functional activity;To address functional/ADL transfers PT goals addressed during session: Mobility/safety with mobility;Balance;Proper use of DME;Strengthening/ROM OT goals addressed during session: ADL's and self-care;Strengthening/ROM       AM-PAC PT "6 Clicks" Mobility  Outcome Measure Help needed turning from your back to your side while in a flat bed without using bedrails?: Total Help needed moving from lying on your back to sitting on the side of a flat bed without using bedrails?: Total Help needed moving to and from a bed to a chair (including a wheelchair)?: Total Help needed standing up from a chair using your arms (e.g., wheelchair or bedside chair)?: Total Help needed to walk in hospital room?: Total Help needed climbing 3-5 steps with a railing? : Total 6 Click Score: 6    End of Session Equipment Utilized During Treatment: Gait belt Activity Tolerance: Patient limited by fatigue;Patient limited by pain Patient left: in chair;with call bell/phone within reach;with chair alarm set Nurse Communication: Mobility status;Other (comment)(Total assist transfer) PT Visit Diagnosis: Muscle weakness (generalized) (M62.81);Difficulty in walking, not elsewhere classified (R26.2);Pain Pain - Right/Left: (generalized) Pain - part of body: (generalized)    Time: 2353-6144 PT Time Calculation (min) (ACUTE ONLY): 45 min   Charges:   PT Evaluation $PT Eval Moderate Complexity: 1 Mod PT Treatments $Therapeutic Activity: 8-22 mins      Anahli Arvanitis B. Yanil Dawe, PT, DPT  Acute Rehabilitation [phone] pager [phone] office  @  [facility]: [phone]

## 2018-08-14 NOTE — Progress Notes (Signed)
[facility]    NEPHROLOGY PROGRESS NOTE  SUBJECTIVE: Patient seen and examined.  Patient resting on examination.  The patient provides no acute complaints.  Review of chart reveals a negative 10 point review of systems.  Chest x-ray from this morning noted.   OBJECTIVE:  Vitals:   08/14/18 1100 08/14/18 1108  BP: (!) 110/47 104/77  Pulse:    Resp:    Temp:    SpO2:      Intake/Output Summary (Last 24 hours) at 08/14/2018 1417 Last data filed at 08/13/2018 2150 Gross per 24 hour  Intake 340 ml  Output 1 ml  Net 339 ml      General: Resting, NAD HEENT: MMM [place] AT anicteric sclera Neck:  No JVD, no adenopathy CV:  Heart RRR  Lungs: Lung sounds with decreased breath sounds bilaterally.  Oxygen saturation 99% on room air Abd:  abd SNT/ND with normal BS GU:  Bladder non-palpable Extremities:  No LE edema. Skin:  No skin rash  MEDICATIONS:  . calcitRIOL  0.25 mcg Oral Daily  . Chlorhexidine Gluconate Cloth  6 each Topical Q0600  . darbepoetin (ARANESP) injection - DIALYSIS  200 mcg Intravenous Q Fri-HD  . escitalopram  5 mg Oral Daily  . feeding supplement (NEPRO CARB STEADY)  237 mL Oral BID BM  . midodrine  10 mg Oral TID WC  . multivitamin  1 tablet Oral QHS  . Pancrealipase (Lip-Prot-Amyl) CPEP 6,000 units  1 capsule Oral TID WC  . pantoprazole  40 mg Oral BID  . sodium chloride flush  10-40 mL Intracatheter Q12H       LABS:   CBC Latest Ref Rng & Units 08/12/2018 08/12/2018 08/12/2018  WBC 4.0 - 10.5 K/uL - - 10.0  Hemoglobin 12.0 - 15.0 g/dL 11.2(L) 11.8(L) 10.7(L)  Hematocrit 36.0 - 46.0 % 35.0(L) 36.5 32.9(L)  Platelets 150 - 400 K/uL - - 81(L)    CMP Latest Ref Rng & Units 08/14/2018 08/13/2018 08/12/2018  Glucose 70 - 99 mg/dL 156(H) 90 137(H)  BUN 8 - 23 mg/dL 20 9 18   Creatinine 0.44 - 1.00 mg/dL 2.49(H) 1.62(H) 2.80(H)  Sodium 135 - 145 mmol/L 135 138 136  Potassium 3.5 - 5.1 mmol/L 4.8 4.2 4.5  Chloride 98 - 111 mmol/L 103 104 106  CO2  22 - 32 mmol/L 26 24 23   Calcium 8.9 - 10.3 mg/dL 7.0(L) 6.9(L) 6.6(L)  Total Protein 6.5 - 8.1 g/dL - - -  Total Bilirubin 0.3 - 1.2 mg/dL - - -  Alkaline Phos 38 - 126 U/L - - -  AST 15 - 41 U/L - - -  ALT 0 - 44 U/L - - -    Lab Results  Component Value Date   PTH 70 (H) 08/08/2018   PTH Comment 08/08/2018   CALCIUM 7.0 (L) 08/14/2018   CAION 1.05 (L) 08/03/2018   PHOS 2.1 (L) 08/14/2018       Component Value Date/Time   COLORURINE YELLOW 07/17/2012 1751   APPEARANCEUR CLOUDY (A) 07/17/2012 1751   LABSPEC 1.020 07/17/2012 1751   PHURINE 5.5 07/17/2012 1751   GLUCOSEU NEGATIVE 07/17/2012 1751   HGBUR SMALL (A) 07/17/2012 1751   BILIRUBINUR NEGATIVE 07/17/2012 1751   KETONESUR NEGATIVE 07/17/2012 1751   PROTEINUR 30 (A) 07/17/2012 1751   UROBILINOGEN 0.2 07/17/2012 1751   NITRITE NEGATIVE 07/17/2012 1751   LEUKOCYTESUR SMALL (A) 07/17/2012 1751      Component Value Date/Time   PHART 7.396 08/04/2018 1345  PCO2ART 33.6 08/04/2018 1345   PO2ART 182 (H) 08/04/2018 1345   HCO3 20.2 08/04/2018 1345   TCO2 23 08/03/2018 2248   ACIDBASEDEF 3.8 (H) 08/04/2018 1345   O2SAT 99.4 08/04/2018 1345       Component Value Date/Time   IRON 111 07/31/2018 0336   TIBC 136 (L) 07/31/2018 0336   FERRITIN 1,324 (H) 07/31/2018 0336   IRONPCTSAT 82 (H) 07/31/2018 0336       ASSESSMENT/PLAN:    78 year old female patient with a past medical history significant for chronic diastolic heart failure, coronary artery disease, type 2 diabetes, hypertension, hyperlipidemia, stroke and end-stage renal disease on hemodialysis Monday, Wednesday, and Friday who presented on 07/30/2018 with altered mental status and anemia with a hemoglobin of 5.7.  An EGD revealed an AVM.  She also underwent a colonoscopy and repeat EGD.  1.  End-stage renal disease on hemodialysis.  We will continue dialysis on Monday, Wednesday, and Friday.  Will attempt to challenge dry weight.  2.  Acute blood loss anemia.   Hemoglobin is stable and at goal.  3.  Secondary hyperparathyroidism.  PTH was 70.  Phosphorus stable at 4.1.  Will add calcitriol given markedly low calcium level.  Vitamin D level pending.   Nicholas, DO, [place]

## 2018-08-14 NOTE — Progress Notes (Signed)
RN communicated with patient family member [name] regarding pt condition and the plan of care for today.  pharmacy was also  notify of pt home medication pancrealipase and were abel to deliver the medication.

## 2018-08-14 NOTE — Progress Notes (Signed)
Patient ID: Courtney [name], female   DOB: 25-Apr-1940, 78 y.o.   MRN: [id number]  PROGRESS NOTE    Courtney [name]  XVQ:[id number] DOB: [birth year] DOA: 07/30/2018 PCP: Neale Burly, MD   Brief Narrative:  78 year old female with history of end-stage renal disease on hemodialysis, chronic diastolic heart failure, CAD, diabetes mellitus type 2, hypertension, hyperlipidemia, stroke presented on 07/30/2018 with altered mental status and hemoglobin of 5.7.  GI and nephrology were consulted.  She was transfused 2 units packed red cells.  CT of the abdomen and pelvis showed small amount of gas in the endometrial canal of uncertain etiology with bilateral pleural effusions.  This was discussed with GYN on-call who did not recommend any further work-up.  Patient has had a long hospitalization.  Initially there was no plans for any endoscopic procedures but patient continued to have intermittent blood in stools.  She required more packed red cell transfusion.  Patient was transferred to ICU on 08/03/2018.  Tagged red cell study was positive with concern for proximal bleeding source such as duodenum or stomach.  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  Patient also underwent left-sided thoracentesis on 08/05/2018 for left pleural effusion and subsequently developed pneumothorax for which chest tube was placed.  Patient again had capsule bleeding on 08/06/2018 and hemoglobin had dropped to 6.7 from 7.7.  She was subsequently transfused packed red cells again.  She underwent colonoscopy on 08/07/2018 which showed old blood in the entire colon, no active bleeding noted on lavage, few polyps noted but not removed and terminal ileum showed old blood as well.  She had repeat EGD on 08/07/2018 which showed small amount of blood in fundus, duodenal ulcer and visible vessel without active bleeding for which one Endo Clip was placed.  Patient was continued on Protonix  drip.  Small bowel PillCam was placed.  Chest tube was removed on 08/09/2018. subsequently, her oral intake has been very poor.  NG tube feeding has been started.  She was transferred back to [facility] service on 08/12/2018.  Assessment & Plan:  Acute on chronic blood loss anemia Upper GI bleeding most likely duodenal bleed -Patient presented with hemoglobin of 5.7.  Has had 7 units of packed red cells transfusion since admission, last transfusion was on 08/10/2018. -Monitor hemoglobin.  No overnight black or bloody stools -Tagged red cell study was positive with concern for proximal bleeding source such as duodenum or stomach. -Status post EGD on 08/03/2018 which showed duodenal AVM which was injected and treated with gold probe.   -Status post  colonoscopy on 08/07/2018 which showed old blood in the entire colon, no active bleeding noted on lavage, few polyps noted but not removed and terminal ileum showed old blood as well.  She had repeat EGD on 08/07/2018 which showed small amount of blood in fundus, duodenal ulcer and visible vessel without active bleeding for which one Endo Clip was placed. -Continue twice a day Protonix.  GI recommends PPI twice a day for at least 4 weeks thereafter once a day indefinitely.  Aspirin and Plavix on hold.  If absolutely needed, GI recommends aspirin 81 mg only, possibly after a week.  GI has signed off.  Outpatient follow-up with GI.  Iatrogenic pneumothorax -Resolved.  Status post removal of chest tube on 08/09/2018.  PCCM has signed off  End-stage renal disease on hemodialysis Hypotension -Dialysis as per nephrology schedule -Blood pressure on the lower  side but stable.  Continue midodrine.  Recurrent pleural effusions -Status post left thoracentesis with subsequent pneumothorax and chest tube placement on 08/05/2018.  Continue volume removal as per HD.  Defer further thoracentesis unless patient is in severe respiratory  distress  Malnutrition Hypoalbuminemia -Patient continues to refuse to eat.  Patient apparently pulled out her NG tube earlier this morning.  NG tube has subsequently been replaced.   -Patient/family not interested in pursuing PEG tube feeding. -We will have to keep encouraging the patient to eat better.  Generalized deconditioning History of stroke/severe debility -Aspirin and Plavix on hold. -Overall prognosis is guarded to poor.  Will request palliative care evaluation.  Spoke to daughter/[name] over the phone on 08/12/2018 and gave updates. -PT/OT eval  CAD -Stable.  Aspirin/Plavix on hold  Diabetes mellitus type II -Blood sugars stable.  Acute hypoxic respiratory failure COPD -Was briefly intubated for procedure. -Resolved. -Currently on room air. -Continue PRN bronchodilators and pulmonary toiletry. -Looks tachypneic today.  Will get chest x-ray.  Chronic diastolic CHF -Volume managed by dialysis.    DVT prophylaxis: SCDs Code Status:  Full Family Communication: Spoke to daughter/[name] over the phone on 08/12/2018 Disposition Plan: Will depend on clinical outcome and PT/OT evaluation.  Might need to have placement.  Consultants: PCCM/nephrology/GI/IR  Procedures:  EGD 08/03/2018-08/04/2018  EGD on 08/03/2018 which showed duodenal AVM which was injected and treated with gold probe.   Left thoracentesis on 08/05/2018 Left chest tube placement on 08/05/2018 and removal on 08/09/2018 Left IJ central venous catheter placement on 08/07/2018 -Status post  colonoscopy on 08/07/2018 which showed old blood in the entire colon, no active bleeding noted on lavage, few polyps noted but not removed and terminal ileum showed old blood as well.  She had repeat EGD on 08/07/2018 which showed small amount of blood in fundus, duodenal ulcer and visible vessel without active bleeding for which one Endo Clip was placed.  Antimicrobials:  None  Subjective: Patient seen and examined at  bedside.  She is a poor historian.  Hardly participates in any conversation.  No overnight fever, vomiting, black or bloody stools reported by nursing staff.  Very poor oral intake.   Objective: Vitals:   08/13/18 2321 08/14/18 0027 08/14/18 0427 08/14/18 0428  BP:  (!) 117/40  (!) 93/35  Pulse: 66 68  63  Resp:  (!) 41 (!) 22 (!) 21  Temp: 97.6 F (36.4 C) 98.5 F (36.9 C)  98.1 F (36.7 C)  TempSrc: Axillary Oral  Axillary  SpO2: (!) 85% 90%  91%  Weight:      Height:        Intake/Output Summary (Last 24 hours) at 08/14/2018 0802 Last data filed at 08/13/2018 2150 Gross per 24 hour  Intake 489.33 ml  Output 1 ml  Net 488.33 ml   Filed Weights   08/12/18 0724 08/13/18 0100 08/13/18 0510  Weight: 40.5 kg 38.6 kg 36.8 kg    Examination:  General exam: No distress.  Looks chronically ill.  Has NG tube in place.  Extremely thinly built.  Awake but hardly answers any questions and does not participate in any conversation. Respiratory system: Bilateral decreased breath sounds at bases with scattered crackles. Tachypneic Cardiovascular system: S1-S2 heard, rate controlled Gastrointestinal system: Abdomen is nondistended, soft and nontender. Normal bowel sounds heard. Extremities: No cyanosis; trace edema   Data Reviewed: I have personally reviewed following labs and imaging studies  CBC: Recent Labs  Lab 08/08/18 0430  08/09/18 0423  08/10/18  0401  08/11/18 0430 08/11/18 0830 08/11/18 2129 08/12/18 0401 08/12/18 1103 08/12/18 2155  WBC 10.9*  --  8.1  --  6.3  --  11.3*  --   --  10.0  --   --   NEUTROABS  --   --   --   --   --   --  10.3*  --   --   --   --   --   HGB 8.1*   < > 7.4*   < > 6.4*   < > 11.0* 11.0* 11.8* 10.7* 11.8* 11.2*  HCT 23.7*   < > 22.6*   < > 20.0*   < > 33.8* 33.4* 36.8 32.9* 36.5 35.0*  MCV 86.8  --  90.0  --  93.9  --  91.8  --   --  92.7  --   --   PLT 115*  --  109*  --  76*  --  71*  --   --  81*  --   --    < > = values in this interval  not displayed.   Basic Metabolic Panel: Recent Labs  Lab 08/10/18 0401 08/11/18 0430 08/12/18 0401 08/13/18 0713 08/14/18 0326  NA 136 139 136 138 135  K 3.3* 3.8 4.5 4.2 4.8  CL 107 107 106 104 103  CO2 24 26 23 24 26   GLUCOSE 383* 97 137* 90 156*  BUN 23 7* 18 9 20   CREATININE 3.10* 2.09* 2.80* 1.62* 2.49*  CALCIUM 6.8* 7.1* 6.6* 6.9* 7.0*  MG 1.5* 2.0 2.0  --   --   PHOS 4.1 2.0* 4.1 2.6 2.1*   GFR: Estimated Creatinine Clearance: 11 mL/min (A) (by C-G formula based on SCr of 2.49 mg/dL (H)). Liver Function Tests: Recent Labs  Lab 08/10/18 0401 08/11/18 0430 08/12/18 0401 08/13/18 0713 08/14/18 0326  ALBUMIN 1.4* 1.8* 1.7* 1.6* 1.6*   No results for input(s): LIPASE, AMYLASE in the last 168 hours. No results for input(s): AMMONIA in the last 168 hours. Coagulation Profile: No results for input(s): INR, PROTIME in the last 168 hours. Cardiac Enzymes: No results for input(s): CKTOTAL, CKMB, CKMBINDEX, TROPONINI in the last 168 hours. BNP (last 3 results) No results for input(s): PROBNP in the last 8760 hours. HbA1C: No results for input(s): HGBA1C in the last 72 hours. CBG: Recent Labs  Lab 08/13/18 1201 08/13/18 1756 08/13/18 2024 08/13/18 2331 08/14/18 0454  GLUCAP 129* 110* 141* 149* 131*   Lipid Profile: No results for input(s): CHOL, HDL, LDLCALC, TRIG, CHOLHDL, LDLDIRECT in the last 72 hours. Thyroid Function Tests: No results for input(s): TSH, T4TOTAL, FREET4, T3FREE, THYROIDAB in the last 72 hours. Anemia Panel: No results for input(s): VITAMINB12, FOLATE, FERRITIN, TIBC, IRON, RETICCTPCT in the last 72 hours. Sepsis Labs: No results for input(s): PROCALCITON, LATICACIDVEN in the last 168 hours.  Recent Results (from the past 240 hour(s))  Body fluid culture (includes gram stain)     Status: None   Collection Time: 08/05/18  2:00 PM   Specimen: Pleural Fluid  Result Value Ref Range Status   Specimen Description FLUID PLEURAL LEFT  Final    Special Requests NONE  Final   Gram Stain NO WBC SEEN NO ORGANISMS SEEN   Final   Culture   Final    NO GROWTH 3 DAYS Performed at [facility], [address], [place], [place] [address]    Report Status 08/08/2018 FINAL  Final  Radiology Studies: Dg Abd 1 View  Result Date: 08/13/2018 CLINICAL DATA:  Nasogastric tube placement. EXAM: ABDOMEN - 1 VIEW COMPARISON:  08/11/2018 FINDINGS: Nasogastric tube is present with tip over the right mid abdomen likely within the distal stomach or proximal duodenum. Stable bibasilar opacification over the lung bases. Multiple air-filled nondilated small bowel loops present. Paucity of bowel gas within the colon. No free peritoneal air. Remainder the exam is unchanged. IMPRESSION: Nonspecific, nonobstructive bowel gas pattern with increased number of air-filled nondilated small bowel loops over the mid to lower abdomen. Nasogastric tube with tip over the right mid abdomen likely over the distal stomach or proximal duodenum. Electronically Signed   By: Marin Olp M.D.   On: 08/13/2018 09:13        Scheduled Meds:  calcitRIOL  0.25 mcg Oral Daily   Chlorhexidine Gluconate Cloth  6 each Topical Q0600   darbepoetin (ARANESP) injection - DIALYSIS  200 mcg Intravenous Q Fri-HD   escitalopram  5 mg Oral Daily   feeding supplement (NEPRO CARB STEADY)  237 mL Oral BID BM   midodrine  10 mg Oral TID WC   multivitamin  1 tablet Oral QHS   Pancrealipase (Lip-Prot-Amyl) CPEP 6,000 units  1 capsule Oral TID WC   pantoprazole  40 mg Oral BID   sodium chloride flush  10-40 mL Intracatheter Q12H   Continuous Infusions:  feeding supplement (OSMOLITE 1.5 CAL) 40 mL (08/13/18 0946)     LOS: 15 days        Aline August, MD [facility] 08/14/2018, 8:02 AM

## 2018-08-14 NOTE — Evaluation (Signed)
Occupational Therapy Evaluation Patient Details Name: Courtney [name] MRN: [id number] DOB: 1940-06-21 Today's Date: 08/14/2018    History of Present Illness Pt is a 78 yr old female with history of end stage end stage renal disease. Pt has had extensive stay with Acute on chronic blood loss anemia and has had 7 units of RBC.  PMH: CHF, CAD, stroke (2-3 yrs) MI, HTN, Hypertention, DM    Clinical Impression   Pt admitted with see above. Pt currently with functional limitations due to the deficits listed below (see OT Problem List). Pt is a poor historian of PLOF. She reported she lives in a home with steps and her son but unable to report any other information. Pt requires max to total assist with transfers/ADLS at this time. Pt had soft BP readings in session. Without no signs of dizziness.  Pt will benefit from skilled OT to increase their safety and independence with ADL and functional mobility for ADL to facilitate discharge to venue listed below. Pt may need equipment but unknown clear PLOF.       Follow Up Recommendations  SNF;Supervision/Assistance - 24 hour    Equipment Recommendations       Recommendations for Other Services       Precautions / Restrictions Precautions Precautions: Fall Precaution Comments: R sided weakness, NGT for feeding, sacral and ischial wounds Restrictions Weight Bearing Restrictions: No      Mobility Bed Mobility Overal bed mobility: Needs Assistance Bed Mobility: Rolling;Sidelying to Sit Rolling: Max assist;+2 for physical assistance Sidelying to sit: Max assist;+2 for physical assistance       General bed mobility comments: Two person max assist to roll several times for peri care, pt attempting to reach with L arm and move left leg, but most assist provided by therapists.    Transfers Overall transfer level: Needs assistance   Transfers: Squat Pivot Transfers     Squat pivot transfers: +2 physical assistance;Total assist;From elevated  surface     General transfer comment: Two person total assist to squat pivot to the left from high bed to recliner chair.      Balance Overall balance assessment: Needs assistance Sitting-balance support: Feet supported;Single extremity supported Sitting balance-Leahy Scale: Zero Sitting balance - Comments: Max assist EOB due to R lateral lean, pt is able to elicit some trunkal correction with cues, but fatigues quickly requiring max assist EOB to maintain upright, midline posture.   Postural control: Right lateral lean Standing balance support: Single extremity supported Standing balance-Leahy Scale: Zero Standing balance comment: total assist for partial stand (squat) to chair.                            ADL either performed or assessed with clinical judgement   ADL Overall ADL's : Needs assistance/impaired Eating/Feeding: Moderate assistance;Cueing for safety;Sitting(not foramlly evaled)   Grooming: Wash/dry hands;Maximal assistance;Sitting   Upper Body Bathing: Total assistance;Sitting   Lower Body Bathing: Total assistance;Cueing for safety;Cueing for sequencing   Upper Body Dressing : Maximal assistance;Sitting   Lower Body Dressing: Total assistance;Cueing for safety;Cueing for sequencing   Toilet Transfer: Total assistance;Cueing for safety;Cueing for sequencing;+2 for physical assistance;+2 for safety/equipment   Toileting- Clothing Manipulation and Hygiene: Total assistance;Cueing for safety;Cueing for sequencing   Tub/ Shower Transfer: Total assistance;+2 for physical assistance;+2 for safety/equipment   Functional mobility during ADLs: Total assistance;+2 for physical assistance;+2 for safety/equipment       Vision  Perception Perception Perception Tested?: No   Praxis Praxis Praxis tested?: Not tested    Pertinent Vitals/Pain Pain Assessment: Faces Faces Pain Scale: Hurts even more Pain Location: "everywhere" Pain Descriptors /  Indicators: Grimacing;Guarding Pain Intervention(s): Limited activity within patient's tolerance;Monitored during session;Repositioned     Hand Dominance     Extremity/Trunk Assessment Upper Extremity Assessment Upper Extremity Assessment: RUE deficits/detail;Generalized weakness RUE Deficits / Details: Decrease ROM, strength possible due to old stroke 2-3 years ago RUE Sensation: decreased light touch RUE Coordination: decreased fine motor;decreased gross motor   Lower Extremity Assessment Lower Extremity Assessment: Defer to PT evaluation RLE Deficits / Details: Right leg is her weaker leg due to PMH of stroke with R UE/LE weakness, I was unable to elicit movement to command on this side.  LLE Deficits / Details: left leg weak against gravity movement, grossly 2+ to 3-/5 per bed level and EOB assessment.    Cervical / Trunk Assessment Cervical / Trunk Assessment: Kyphotic   Communication Communication Communication: Other (comment)   Cognition Arousal/Alertness: Awake/alert Behavior During Therapy: Flat affect Overall Cognitive Status: Impaired/Different from baseline Area of Impairment: Orientation;Attention;Safety/judgement;Following commands;Memory;Awareness;Problem solving                 Orientation Level: Disoriented to;Place;Time;Situation Current Attention Level: Sustained Memory: Decreased recall of precautions;Decreased short-term memory Following Commands: Follows one step commands with increased time Safety/Judgement: Decreased awareness of safety;Decreased awareness of deficits Awareness: Intellectual Problem Solving: Slow processing;Decreased initiation;Difficulty sequencing;Requires verbal cues;Requires tactile cues General Comments: Pt generally able to follow basic commands to the best of her physical ability with mild delay in processing.  She has difficutly with orientation and history questions.     General Comments  BPs soft, but stable, see vitals  flow sheet.  They came up mildly as we progressed to [place].  HR and O2 sats stable on RA throughout.  Some PVCs noted, but no acute distress.     Exercises     Shoulder Instructions      Home Living Family/patient expects to be discharged to:: Private residence Living Arrangements: Children Available Help at Discharge: Family Type of Home: House Home Access: Stairs to enter                         Additional Comments: Not sure if pt is giving accurate hx.       Prior Functioning/Environment          Comments: Unsure due to poor historian        OT Problem List: Decreased strength;Decreased range of motion;Decreased activity tolerance;Impaired balance (sitting and/or standing);Decreased coordination;Decreased cognition;Decreased safety awareness;Decreased knowledge of use of DME or AE;Decreased knowledge of precautions;Pain      OT Treatment/Interventions: Self-care/ADL training;Therapeutic exercise;Neuromuscular education;Energy conservation;DME and/or AE instruction;Therapeutic activities;Cognitive remediation/compensation;Patient/family education;Balance training    OT Goals(Current goals can be found in the care plan section) Acute Rehab OT Goals Patient Stated Goal: unable to state, agreeable she would like to get OOB OT Goal Formulation: With patient Time For Goal Achievement: 08/21/18 Potential to Achieve Goals: Good  OT Frequency: Min 2X/week   Barriers to D/C:            Co-evaluation PT/OT/SLP Co-Evaluation/Treatment: Yes Reason for Co-Treatment: Complexity of the patient's impairments (multi-system involvement);For patient/therapist safety;Necessary to address cognition/behavior during functional activity;To address functional/ADL transfers PT goals addressed during session: Mobility/safety with mobility;Balance;Proper use of DME;Strengthening/ROM OT goals addressed during session: ADL's and self-care;Strengthening/ROM  AM-PAC OT "6 Clicks"  Daily Activity     Outcome Measure Help from another person eating meals?: A Lot Help from another person taking care of personal grooming?: Total Help from another person toileting, which includes using toliet, bedpan, or urinal?: Total Help from another person bathing (including washing, rinsing, drying)?: Total Help from another person to put on and taking off regular upper body clothing?: Total Help from another person to put on and taking off regular lower body clothing?: Total 6 Click Score: 7   End of Session Equipment Utilized During Treatment: Gait belt Nurse Communication: Mobility status  Activity Tolerance: Patient limited by fatigue;Patient limited by lethargy;Patient limited by pain Patient left: in chair;with chair alarm set  OT Visit Diagnosis: Unsteadiness on feet (R26.81);Repeated falls (R29.6);Muscle weakness (generalized) (M62.81);Feeding difficulties (R63.3);Pain;Cognitive communication deficit (R41.841) Pain - part of body: (all over)                Time: 8118-8677 OT Time Calculation (min): 45 min Charges:  OT General Charges $OT Visit: 1 Visit OT Evaluation $OT Eval Moderate Complexity: [place] OTR/L  Acute Rehab Services  [phone] office number [phone] pager number   Joeseph Amor 08/14/2018, 12:10 PM

## 2018-08-14 NOTE — Consult Note (Signed)
Consultation Note Date: 08/14/2018   Patient Name: Courtney [name]  DOB: 1940/06/17  MRN: [id number]  Age / Sex: 78 y.o., female  PCP: Neale Burly, MD Referring Physician: Aline August, MD  Reason for Consultation: Establishing goals of care  HPI/Patient Profile: 78 y.o. female  with past medical history of ESRD on HD, diastolic heart failure, CAD, T2DM, HTN, HLD, stroke, and COPD admitted on 07/30/2018 with AMS. Found to have low hemoglobin. Throughout admission has had  EGDs, colonoscopy, and tagged red cell study - bleed attributed to duodenal bleed - has received 7 units of PRBCs during admission. Hgb now stable - no more blood noted in stools. Also had issues with pleural effusions throughout admission - had thoracentesis and required chest tube placement - now removed. Has continued dialysis throughout admission. Patient has not been eating well - required NG tube to administer nutrition. Was briefly intubated following a procedure, currently on room air. PMT consulted for [place].   Clinical Assessment and Goals of Care: I have reviewed medical records including EPIC notes, labs and imaging, and received report from RN.   RN tells me patient still has NG tube with tube feeding but is tolerating more PO intake today in addition to tube feeding.  Patient moved to chair - max assist.  I then spoke with daughter, [name],  to discuss diagnosis prognosis, GOC, EOL wishes, disposition and options.  I introduced Palliative Medicine as specialized medical care for people living with serious illness. It focuses on providing relief from the symptoms and stress of a serious illness. The goal is to improve quality of life for both the patient and the family.   As far as functional and nutritional status, [name] tells me patient is non-ambulatory - she says she is too weak to walk and sleeps most of the time. Patient lives with her son who provides her care  and [name] helps with care as well. She tells me that patient's appetite waxes and wanes - she says she eats enough to sustain herself. She tells me she has been tolerating HD well.    We discussed her current illness and what it means in the larger context of her on-going co-morbidities.  Natural disease trajectory and expectations at EOL were discussed. [name] has good understanding of hospital course and complications.   We discussed feeding issues - [name] tells me they do not want a PEG; however, then she asks if her mother can come home with NG tube - we discussed that the NG is temporary while patient is hospitalized and patient would need a PEG for permanent feeding tube - [name] again tells me her mother would not want this and she believes she would pull it out. She is hopeful appetite continues to improve.   [name] shares that they are NOT interested in facility placement - she believes they can provide the care the patient needs at home. They are also not interested in home health/home palliative as the patient does not like strangers in her home. Not a candidate for hospice as patient is on HD and not ready to stop - do not think hospice aligns with goals at this time either.  Advance directives, concepts specific to code status, artifical feeding and hydration, and rehospitalization were considered and discussed. We discussed code status - [name] confirms that they have spoken about this before and patient wants to remain full code and [name] agrees.   Questions and concerns were addressed.  The family was encouraged  to call with questions or concerns.   Primary Decision Maker NEXT OF KIN - daughter and son  SUMMARY OF RECOMMENDATIONS   - full code/full scope (except no PEG) family hopeful for improvement in appetite - family does not want patient in a facility and not interested in home health/home palliative, they feel they are able to provide the care she needs at home  - PMT will shadow for decline  Code Status/Advance Care Planning:  Full code   Symptom Management:   Per primary  Palliative Prophylaxis:   Aspiration, Delirium Protocol and Frequent Pain Assessment  Additional Recommendations (Limitations, Scope, Preferences):  Full Scope Treatment  Prognosis:   Unable to determine  Discharge Planning: home      Primary Diagnoses: Present on Admission: . Symptomatic anemia . Pressure injury of skin . Cerebrovascular disease . Hyperlipidemia . Coronary artery disease . Hypotensive episode . GI bleed   I have reviewed the medical record, interviewed the patient and family, and examined the patient. The following aspects are pertinent.  Past Medical History:  Diagnosis Date  . Anemia 07/2018  . CHF (congestive heart failure) ([place])   . Coronary artery disease   . Diabetes mellitus without complication ([place])   . ESRD on dialysis ([place]) 02/10/2017  . Hyperlipidemia   . Hypertension   . Myocardial infarct ([place])   . Stroke [facility])    2-3 yrs ago- right sided weakness   Social History   Socioeconomic History  . Marital status: Single    Spouse name: Not on file  . Number of children: Not on file  . Years of education: Not on file  . Highest education level: Not on file  Occupational History  . Not on file  Social Needs  . Financial resource strain: Not on file  . Food insecurity    Worry: Not on file    Inability: Not on file  . Transportation needs    Medical: Not on file    Non-medical: Not on file  Tobacco Use  . Smoking status: Never Smoker  . Smokeless tobacco: Never Used  Substance and Sexual Activity  . Alcohol use: No  . Drug use: No  . Sexual activity: Not Currently    Birth control/protection: None  Lifestyle  . Physical activity    Days per week: Not on file    Minutes per session: Not on file  . Stress: Not on file  Relationships  . Social Herbalist on phone: Not on file    Gets  together: Not on file    Attends religious service: Not on file    Active member of club or organization: Not on file    Attends meetings of clubs or organizations: Not on file    Relationship status: Not on file  Other Topics Concern  . Not on file  Social History Narrative  . Not on file   Family History  Problem Relation Age of Onset  . Heart disease Mother   . Heart disease Father    Scheduled Meds: . calcitRIOL  0.25 mcg Oral Daily  . Chlorhexidine Gluconate Cloth  6 each Topical Q0600  . darbepoetin (ARANESP) injection - DIALYSIS  200 mcg Intravenous Q Fri-HD  . escitalopram  5 mg Oral Daily  . feeding supplement (NEPRO CARB STEADY)  237 mL Oral BID BM  . midodrine  10 mg Oral TID WC  . multivitamin  1 tablet Oral QHS  . Pancrealipase (Lip-Prot-Amyl) CPEP 6,000 units  1 capsule Oral TID WC  . pantoprazole  40 mg Oral BID  . sodium chloride flush  10-40 mL Intracatheter Q12H   Continuous Infusions: . feeding supplement (OSMOLITE 1.5 CAL) 1,000 mL (08/14/18 1359)   PRN Meds:.acetaminophen, oxyCODONE, sodium chloride flush Allergies  Allergen Reactions  . Grapefruit Flavor [Flavoring Agent]     Reaction unknown    Vital Signs: BP 104/77 (BP Location: Left Arm)   Pulse 70   Temp 98.1 F (36.7 C) (Axillary)   Resp (!) 21   Ht 4\' 11"  (1.499 m)   Wt 36.8 kg   SpO2 94%   BMI 16.39 kg/m  Pain Scale: PAINAD POSS *See Group Information*: S-Acceptable,Sleep, easy to arouse Pain Score: 0-No pain   SpO2: SpO2: 94 % O2 Device:SpO2: 94 % O2 Flow Rate: .O2 Flow Rate (L/min): 2 L/min  IO: Intake/output summary:   Intake/Output Summary (Last 24 hours) at 08/14/2018 1555 Last data filed at 08/13/2018 2150 Gross per 24 hour  Intake 180 ml  Output 1 ml  Net 179 ml    LBM: Last BM Date: 08/13/18 Baseline Weight: Weight: 33.9 kg Most recent weight: Weight: 36.8 kg     Palliative Assessment/Data: PPS 30%    The above conversation was completed via telephone due to  the visitor restrictions during the COVID-19 pandemic. Thorough chart review and discussion with necessary members of the care team was completed as part of assessment. All issues were discussed and addressed but no physical exam was performed.   Time Total: 50 minutes Greater than 50%  of this time was spent counseling and coordinating care related to the above assessment and plan.  Juel Burrow, DNP, AGNP-C Palliative Medicine Team [phone] Pager: [phone]

## 2018-08-15 ENCOUNTER — Inpatient Hospital Stay (HOSPITAL_COMMUNITY): Payer: Medicare Other

## 2018-08-15 LAB — RENAL FUNCTION PANEL
Albumin: 1.8 g/dL — ABNORMAL LOW (ref 3.5–5.0)
Anion gap: 7 (ref 5–15)
BUN: 29 mg/dL — ABNORMAL HIGH (ref 8–23)
CO2: 28 mmol/L (ref 22–32)
Calcium: 7.6 mg/dL — ABNORMAL LOW (ref 8.9–10.3)
Chloride: 103 mmol/L (ref 98–111)
Creatinine, Ser: 3.47 mg/dL — ABNORMAL HIGH (ref 0.44–1.00)
GFR calc Af Amer: 14 mL/min — ABNORMAL LOW (ref >60)
GFR calc non Af Amer: 12 mL/min — ABNORMAL LOW (ref >60)
Glucose, Bld: 92 mg/dL (ref 70–99)
Phosphorus: 2 mg/dL — ABNORMAL LOW (ref 2.5–4.6)
Potassium: 4.7 mmol/L (ref 3.5–5.1)
Sodium: 138 mmol/L (ref 135–145)

## 2018-08-15 LAB — CBC
HCT: 34.2 % — ABNORMAL LOW (ref 36.0–46.0)
Hemoglobin: 10.7 g/dL — ABNORMAL LOW (ref 12.0–15.0)
MCH: 30.1 pg (ref 26.0–34.0)
MCHC: 31.3 g/dL (ref 30.0–36.0)
MCV: 96.1 fL (ref 80.0–100.0)
Platelets: 163 10*3/uL (ref 150–400)
RBC: 3.56 MIL/uL — ABNORMAL LOW (ref 3.87–5.11)
RDW: 18.2 % — ABNORMAL HIGH (ref 11.5–15.5)
WBC: 9.1 10*3/uL (ref 4.0–10.5)
nRBC: 0 % (ref 0.0–0.2)

## 2018-08-15 LAB — GLUCOSE, CAPILLARY
Glucose-Capillary: 89 mg/dL (ref 70–99)
Glucose-Capillary: 82 mg/dL (ref 70–99)
Glucose-Capillary: 56 mg/dL — ABNORMAL LOW (ref 70–99)
Glucose-Capillary: 78 mg/dL (ref 70–99)
Glucose-Capillary: 194 mg/dL — ABNORMAL HIGH (ref 70–99)
Glucose-Capillary: 98 mg/dL (ref 70–99)
Glucose-Capillary: 88 mg/dL (ref 70–99)
Glucose-Capillary: 91 mg/dL (ref 70–99)

## 2018-08-15 LAB — VITAMIN D 25 HYDROXY (VIT D DEFICIENCY, FRACTURES): Vit D, 25-Hydroxy: 9 ng/mL — ABNORMAL LOW (ref 30.0–100.0)

## 2018-08-15 MED ORDER — PENTAFLUOROPROP-TETRAFLUOROETH EX AERO: 1.0000 "application " | INHALATION_SPRAY | CUTANEOUS | Status: DC | PRN

## 2018-08-15 MED ORDER — VITAMIN D 25 MCG (1000 UNIT) PO TABS
2000.0000 [IU] | ORAL_TABLET | Freq: Every day | ORAL | Status: DC
  Administered 2018-08-15 – 2018-08-19 (×5): 2000 [IU] via ORAL
  Filled 2018-08-15 (×5): qty 2

## 2018-08-15 MED ORDER — SODIUM CHLORIDE 0.9 % IV SOLN: 100.0000 mL | INTRAVENOUS | Status: DC | PRN

## 2018-08-15 MED ORDER — LIDOCAINE-PRILOCAINE 2.5-2.5 % EX CREA: 1.0000 "application " | TOPICAL_CREAM | CUTANEOUS | Status: DC | PRN

## 2018-08-15 MED ORDER — DEXTROSE 50 % IV SOLN
INTRAVENOUS | Status: AC
  Filled 2018-08-15: qty 50

## 2018-08-15 MED ORDER — DEXTROSE 50 % IV SOLN
50.0000 mL | Freq: Once | INTRAVENOUS | Status: AC
  Administered 2018-08-15: 07:00:00 50 mL via INTRAVENOUS

## 2018-08-15 MED ORDER — LIDOCAINE HCL (PF) 1 % IJ SOLN: 5.0000 mL | INTRAMUSCULAR | Status: DC | PRN

## 2018-08-15 MED ORDER — OSMOLITE 1.5 CAL PO LIQD
1000.0000 mL | ORAL | Status: DC
  Administered 2018-08-16 – 2018-08-18 (×4): 1000 mL
  Filled 2018-08-15 (×5): qty 1000

## 2018-08-15 NOTE — Progress Notes (Signed)
NGT was advanced 15 cm per radiology recommendation. Reordered abdominal xray to check placement, to restart feeding tube. Will continue to monitor.

## 2018-08-15 NOTE — Progress Notes (Signed)
Daughter Reathel Turi has permission to come and visit patient.

## 2018-08-15 NOTE — Progress Notes (Addendum)
Nutrition Follow-up  RD working remotely.  DOCUMENTATION CODES:   Severe malnutrition in context of chronic illness, Underweight  INTERVENTION:   -Continue renal MVI daily -Continue Nepro Shake po BID, each supplement provides 425 kcal and 19 grams protein -Continue Magic cup TID with meals, each supplement provides 290 kcal and 9 grams of protein -Initiate 48 hour calorie count, per MD request -Transition to nocturnal feedings of Osmolite 1.5 @ 35 ml/hr via NGT over 12 hour period (2000-0800) in attempt to stimulate appetite. Regimen will provides 630 kcals, 26 grams protein, and 320 ml free water daily, meeting 50% of estimated kcal needs and 47% of estimated protein needs -Downgrade diet to dysphagia 2 (mechanical soft) for ease of intake -Feeding assistance with meals   NUTRITION DIAGNOSIS:   Severe Malnutrition related to chronic illness as evidenced by severe fat depletion, severe muscle depletion.  Ongoing  GOAL:   Patient will meet greater than or equal to 90% of their needs  Progressing   MONITOR:   PO intake, Supplement acceptance, Labs, TF tolerance, Skin, I & O's  REASON FOR ASSESSMENT:   Consult Assessment of nutrition requirement/status, Calorie Count  ASSESSMENT:   Courtney [name] is a 78 y.o. female with medical history significant of ESRD ON HD, TThsat, diastolic heart failure, stroke, CAD , DM, hypertension, hyperlipidemia presents to ED from HD for hypotension. Minimal history from the patient, her son at bedside and reports she has been lethargic, slightly confused and weak since yesterday. No bloody stools at home.   6/20- NGT pulled by pt, NGT replaced at bedside per RN 6/22- NGT pulled out, replaced at bedside per RN  Reviewed I/O's: +60 ml x 24 hours and +7.7 L since 08/01/18  Pt remains lethargic and eating very poorly. Noted pt consumed 0% of breakfast today. Per MAR, pt has been accepting Nepro shakes.   Palliative care following for goals of  care. Pt family does not want to pursue PEG, however, amenable to NGT while in the hospital. Pt has pulled out NGT x 2 over the past 48 hours. Osmolite 1.5 currently infusing via NGT at 40 ml/hr, which provides 1440 kcals, 60 grams protein, and 732 ml free water daily meeting 100% of estimated kcal and protein needs. Will initiate calorie count and transition to nocturnal feedings to stimulate appetite and obtain a more accurate reflection of pt's PO intake.   Paged by RN; discussed changes in plan of care regarding nocturnal feedings and calorie count. RN reports she will document pt's intake in chart. Per RN, pt was assisted with her lunch today and consumed 50% of meatloaf and a few sips of Nepro (approximately 207 kcals and 12 grams protein).   Per MD notes, pt refusing SNF and outpatient palliative services and would like to return home to be cared for by family members.   Medications reviewed and include pancrealipase.   Labs reviewed: CBGS: 56-194.   Diet Order:   Diet Order            Diet renal with fluid restriction Fluid restriction: 1200 mL Fluid; Room service appropriate? No; Fluid consistency: Thin  Diet effective now              EDUCATION NEEDS:   No education needs have been identified at this time  Skin:  Skin Assessment: Skin Integrity Issues: Skin Integrity Issues:: Stage I, Stage II, Other (Comment) Stage I: buttocks Stage II: sacrum Other: MASD to buttocks, perineum, sacrum  Last BM:  08/14/18  Height:   Ht Readings from Last 1 Encounters:  08/01/18 4\' 11"  (1.499 m)    Weight:   Wt Readings from Last 1 Encounters:  08/15/18 36.7 kg    Ideal Body Weight:  44.5 kg  BMI:  Body mass index is 16.34 kg/m.  Estimated Nutritional Needs:   Kcal:  1250-1500  Protein:  55-65 gm  Fluid:  1062ml + UOP    Skylinn Vialpando A. Jimmye Norman, RD, LDN, [place] Registered Dietitian II Certified Diabetes Care and Education Specialist Pager: [phone] After hours Pager:  [phone]

## 2018-08-15 NOTE — Progress Notes (Signed)
Patient ID: Courtney [name], female   DOB: 02-05-1941, 78 y.o.   MRN: [id number]  PROGRESS NOTE    Courtney [name]  BOF:[id number] DOB: 08/24/1940 DOA: 07/30/2018 PCP: Neale Burly, MD   Brief Narrative:  78 year old female with history of end-stage renal disease on hemodialysis, chronic diastolic heart failure, CAD, diabetes mellitus type 2, hypertension, hyperlipidemia, stroke presented on 07/30/2018 with altered mental status and hemoglobin of 5.7.  GI and nephrology were consulted.  She was transfused 2 units packed red cells.  CT of the abdomen and pelvis showed small amount of gas in the endometrial canal of uncertain etiology with bilateral pleural effusions.  This was discussed with GYN on-call who did not recommend any further work-up.  Patient has had a long hospitalization.  Initially there was no plans for any endoscopic procedures but patient continued to have intermittent blood in stools.  She required more packed red cell transfusion.  Patient was transferred to ICU on 08/03/2018.  Tagged red cell study was positive with concern for proximal bleeding source such as duodenum or stomach.  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  Patient also underwent left-sided thoracentesis on 08/05/2018 for left pleural effusion and subsequently developed pneumothorax for which chest tube was placed.  Patient again had capsule bleeding on 08/06/2018 and hemoglobin had dropped to 6.7 from 7.7.  She was subsequently transfused packed red cells again.  She underwent colonoscopy on 08/07/2018 which showed old blood in the entire colon, no active bleeding noted on lavage, few polyps noted but not removed and terminal ileum showed old blood as well.  She had repeat EGD on 08/07/2018 which showed small amount of blood in fundus, duodenal ulcer and visible vessel without active bleeding for which one Endo Clip was placed.  Patient was continued on Protonix  drip.  Small bowel PillCam was placed.  Chest tube was removed on 08/09/2018. subsequently, her oral intake has been very poor.  NG tube feeding has been started.  She was transferred back to [facility] service on 08/12/2018.  Assessment & Plan:  Acute on chronic blood loss anemia Upper GI bleeding most likely duodenal bleed -Patient presented with hemoglobin of 5.7.  Has had 7 units of packed red cells transfusion since admission, last transfusion was on 08/10/2018. -Hemoglobin 10.7 today.  No overnight black or bloody stools -Tagged red cell study was positive with concern for proximal bleeding source such as duodenum or stomach. -Status post EGD on 08/03/2018 which showed duodenal AVM which was injected and treated with gold probe.   -Status post  colonoscopy on 08/07/2018 which showed old blood in the entire colon, no active bleeding noted on lavage, few polyps noted but not removed and terminal ileum showed old blood as well.  She had repeat EGD on 08/07/2018 which showed small amount of blood in fundus, duodenal ulcer and visible vessel without active bleeding for which one Endo Clip was placed. -Continue twice a day Protonix.  GI recommends PPI twice a day for at least 4 weeks thereafter once a day indefinitely.  Aspirin and Plavix on hold.  If absolutely needed, GI recommends aspirin 81 mg only, possibly after a week.  GI has signed off.  Outpatient follow-up with GI.  Iatrogenic pneumothorax -Resolved.  Status post removal of chest tube on 08/09/2018.  PCCM has signed off  End-stage renal disease on hemodialysis Hypotension -Dialysis as per nephrology schedule -Blood pressure on the  lower side but stable.  Continue midodrine.  Recurrent pleural effusions -Status post left thoracentesis with subsequent pneumothorax and chest tube placement on 08/05/2018.  Continue volume removal as per HD.  Defer further thoracentesis unless patient is in severe respiratory  distress  Malnutrition Hypoalbuminemia -Patient continues to refuse to eat.  Patient again pulled out her NG tube early this morning and subsequently her blood sugar dropped to the 50s.  NG tube has been placed back again and feeding has been restarted. -Patient/family not interested in pursuing PEG tube feeding.  I had an extensive discussion with [name]/daughter on the phone again on 08/15/2018 and she states that her mother does not like the hospital food and would not want to have PEG tube as she might pull it out as well.  Putting in NG tube every other day since the patient was NG tube out is also not a solution.  Since patient is full code and not interested in hospice and does not want to go to SNF, I encouraged [name] to visit her mom, talk to her mom and may be encouraged her to eat more.  In the meantime I will also order calorie count.  Mother thinks that her mom would eat better at home.  Generalized deconditioning History of stroke/severe debility -Aspirin and Plavix on hold. -Overall prognosis is guarded to poor.  Palliative care evaluation appreciated.  Patient remains full code at this time.  Patient does not want a PEG tube.  CAD -Stable.  Aspirin/Plavix on hold  Diabetes mellitus type II -Blood sugars intermittently on the lower side.  Acute hypoxic respiratory failure COPD -Was briefly intubated for procedure. -Resolved. -Currently on room air. -Continue PRN bronchodilators and pulmonary toiletry. -Chest x-ray from 08/14/2018 showed pleural effusion and increasing congestion.  Hopefully respite status improves with dialysis.  Chronic diastolic CHF -Volume managed by dialysis.    DVT prophylaxis: SCDs Code Status:  Full Family Communication: Spoke to daughter/[name] over the phone on 08/12/2018 on 08/15/2018 Disposition Plan: Patient/family refusing SNF placement.  Home with home health in the next 1 to 2 days if daughter agreeable Consultants:  PCCM/nephrology/GI/IR  Procedures:  EGD 08/03/2018-08/04/2018  EGD on 08/03/2018 which showed duodenal AVM which was injected and treated with gold probe.   Left thoracentesis on 08/05/2018 Left chest tube placement on 08/05/2018 and removal on 08/09/2018 Left IJ central venous catheter placement on 08/07/2018 -Status post  colonoscopy on 08/07/2018 which showed old blood in the entire colon, no active bleeding noted on lavage, few polyps noted but not removed and terminal ileum showed old blood as well.  She had repeat EGD on 08/07/2018 which showed small amount of blood in fundus, duodenal ulcer and visible vessel without active bleeding for which one Endo Clip was placed.  Antimicrobials:  None  Subjective: Patient seen and examined at bedside.  She is a poor historian.  Sleepy, wakes up only only a little bit but hardly participates in any conversation.  She had pulled out her IV early this morning which had to be replaced.  Very poor oral intake reported by nursing staff. Objective: Vitals:   08/15/18 0410 08/15/18 0758 08/15/18 0807 08/15/18 1017  BP:  (!) 104/41 (!) 109/43 (!) 112/50  Pulse:  63 61 (!) 58  Resp: (!) 22  (!) 33 (!) 22  Temp:  98.3 F (36.8 C) 98.6 F (37 C)   TempSrc:  Axillary Axillary   SpO2:   100% 96%  Weight: 36.7 kg  Height:        Intake/Output Summary (Last 24 hours) at 08/15/2018 1125 Last data filed at 08/15/2018 1059 Gross per 24 hour  Intake 60 ml  Output --  Net 60 ml   Filed Weights   08/13/18 0100 08/13/18 0510 08/15/18 0410  Weight: 38.6 kg 36.8 kg 36.7 kg    Examination:  General exam: No acute distress.  Looks chronically ill.  Has NG tube in place currently.  Extremely thinly built.  Wakes up only a little bit but hardly answers any questions and does not participate in any conversation. Respiratory system: Bilateral decreased breath sounds at bases with scattered crackles.  No wheezing.  Slightly tachypneic Cardiovascular system: Rate  controlled, S1-S2 heard Gastrointestinal system: Abdomen is nondistended, soft and nontender. Normal bowel sounds heard. Extremities: No cyanosis; trace edema   Data Reviewed: I have personally reviewed following labs and imaging studies  CBC: Recent Labs  Lab 08/09/18 0423  08/10/18 0401  08/11/18 0430  08/11/18 2129 08/12/18 0401 08/12/18 1103 08/12/18 2155 08/15/18 0334  WBC 8.1  --  6.3  --  11.3*  --   --  10.0  --   --  9.1  NEUTROABS  --   --   --   --  10.3*  --   --   --   --   --   --   HGB 7.4*   < > 6.4*   < > 11.0*   < > 11.8* 10.7* 11.8* 11.2* 10.7*  HCT 22.6*   < > 20.0*   < > 33.8*   < > 36.8 32.9* 36.5 35.0* 34.2*  MCV 90.0  --  93.9  --  91.8  --   --  92.7  --   --  96.1  PLT 109*  --  76*  --  71*  --   --  81*  --   --  163   < > = values in this interval not displayed.   Basic Metabolic Panel: Recent Labs  Lab 08/10/18 0401 08/11/18 0430 08/12/18 0401 08/13/18 0713 08/14/18 0326 08/15/18 0334  NA 136 139 136 138 135 138  K 3.3* 3.8 4.5 4.2 4.8 4.7  CL 107 107 106 104 103 103  CO2 24 26 23 24 26 28   GLUCOSE 383* 97 137* 90 156* 92  BUN 23 7* 18 9 20  29*  CREATININE 3.10* 2.09* 2.80* 1.62* 2.49* 3.47*  CALCIUM 6.8* 7.1* 6.6* 6.9* 7.0* 7.6*  MG 1.5* 2.0 2.0  --   --   --   PHOS 4.1 2.0* 4.1 2.6 2.1* 2.0*   GFR: Estimated Creatinine Clearance: 7.9 mL/min (A) (by C-G formula based on SCr of 3.47 mg/dL (H)). Liver Function Tests: Recent Labs  Lab 08/11/18 0430 08/12/18 0401 08/13/18 0713 08/14/18 0326 08/15/18 0334  ALBUMIN 1.8* 1.7* 1.6* 1.6* 1.8*   No results for input(s): LIPASE, AMYLASE in the last 168 hours. No results for input(s): AMMONIA in the last 168 hours. Coagulation Profile: No results for input(s): INR, PROTIME in the last 168 hours. Cardiac Enzymes: No results for input(s): CKTOTAL, CKMB, CKMBINDEX, TROPONINI in the last 168 hours. BNP (last 3 results) No results for input(s): PROBNP in the last 8760 hours. HbA1C: No  results for input(s): HGBA1C in the last 72 hours. CBG: Recent Labs  Lab 08/15/18 0112 08/15/18 0331 08/15/18 0638 08/15/18 0702 08/15/18 0746  GLUCAP 89 82 78 56* 194*   Lipid Profile: No results for input(s):  CHOL, HDL, LDLCALC, TRIG, CHOLHDL, LDLDIRECT in the last 72 hours. Thyroid Function Tests: No results for input(s): TSH, T4TOTAL, FREET4, T3FREE, THYROIDAB in the last 72 hours. Anemia Panel: No results for input(s): VITAMINB12, FOLATE, FERRITIN, TIBC, IRON, RETICCTPCT in the last 72 hours. Sepsis Labs: No results for input(s): PROCALCITON, LATICACIDVEN in the last 168 hours.  Recent Results (from the past 240 hour(s))  Body fluid culture (includes gram stain)     Status: None   Collection Time: 08/05/18  2:00 PM   Specimen: Pleural Fluid  Result Value Ref Range Status   Specimen Description FLUID PLEURAL LEFT  Final   Special Requests NONE  Final   Gram Stain NO WBC SEEN NO ORGANISMS SEEN   Final   Culture   Final    NO GROWTH 3 DAYS Performed at [facility], [address] [place],  [address]    Report Status 08/08/2018 FINAL  Final         Radiology Studies: Dg Abd 1 View  Result Date: 08/15/2018 CLINICAL DATA:  NG tube advancement. EXAM: ABDOMEN - 1 VIEW 9:53 a.m. COMPARISON:  08/15/2018 at 6:27 a.m. FINDINGS: The NG tube has been advanced and the tip is now in the distal stomach. Bowel gas pattern is normal. Large left pleural effusion. Small right pleural effusion. No acute bone abnormality. IMPRESSION: NG tube now in good position in the distal stomach. No other significant change. Electronically Signed   By: Lorriane Shire M.D.   On: 08/15/2018 10:35   Dg Abd 1 View  Result Date: 08/15/2018 CLINICAL DATA:  NG tube placement. EXAM: ABDOMEN - 1 VIEW COMPARISON:  08/13/2018. FINDINGS: NG tube noted with tip over the upper portion of the stomach. Side hole is noted above the gastroesophageal junction. NG tube advancement of approximately 15 cm  should be considered. Nondistended air-filled loops of small bowel again noted. Colonic gas pattern is normal. No free air. Cardiomegaly with bilateral pulmonary infiltrates/edema and bilateral pleural effusions again noted. IMPRESSION: 1. NG tube noted with tip over the upper portion of the stomach. Side hole is noted above the gastroesophageal junction. NG tube advancement of approximately 15 cm should be considered. 2.  Nondistended air-filled loops of small bowel again noted. 3. Cardiomegaly with bilateral pulmonary infiltrates/edema bilateral pleural effusions again noted. Electronically Signed   By: Marcello Moores  Register   On: 08/15/2018 07:58   Dg Chest Port 1 View  Result Date: 08/14/2018 CLINICAL DATA:  Dyspnea. EXAM: PORTABLE CHEST 1 VIEW COMPARISON:  08/10/2018 FINDINGS: Interval removal of left IJ central venous catheter. Placement of nasogastric tube which courses into the midline upper abdomen as tip is not visualized. Slight interval worsening moderate size left effusion likely with associated basilar atelectasis. Stable to slight worsening small right pleural effusion likely with associated basilar atelectasis. Hazy prominence of the central pulmonary vessels which may indicated mild degree of edema. Infection in the mid to lower lungs is possible. Cardiomediastinal silhouette and remainder the exam is unchanged. IMPRESSION: Worsening moderate size left effusion and stable to slight worsening small right effusion likely with associated bibasilar atelectasis. Likely mild concomitant interstitial edema. Infection in the mid to lower lungs is possible. Nasogastric tube as described. Electronically Signed   By: Marin Olp M.D.   On: 08/14/2018 09:26        Scheduled Meds:  calcitRIOL  0.25 mcg Oral Daily   Chlorhexidine Gluconate Cloth  6 each Topical Q0600   darbepoetin (ARANESP) injection - DIALYSIS  200  mcg Intravenous Q Fri-HD   dextrose       escitalopram  5 mg Oral Daily    feeding supplement (NEPRO CARB STEADY)  237 mL Oral BID BM   midodrine  10 mg Oral TID WC   multivitamin  1 tablet Oral QHS   Pancrealipase (Lip-Prot-Amyl) CPEP 6,000 units  1 capsule Oral TID WC   pantoprazole  40 mg Oral BID   sodium chloride flush  10-40 mL Intracatheter Q12H   Continuous Infusions:  feeding supplement (OSMOLITE 1.5 CAL) 1,000 mL (08/15/18 1059)     LOS: 16 days        Aline August, MD [facility] 08/15/2018, 11:25 AM

## 2018-08-15 NOTE — Progress Notes (Signed)
Writer called patient's daughter, Kassi Esteve, for updates on her mother. She said that she talked with the doctor few minutes ago and she doesn't have any other question. She asked to talk with charge nurse. Charge nurse notified.

## 2018-08-15 NOTE — Progress Notes (Signed)
[facility]    NEPHROLOGY PROGRESS NOTE  SUBJECTIVE: Patient seen and examined.  Patient resting on examination.  The patient provides no acute complaints.  Review of chart reveals a negative 10 point review of systems.  Chest x-ray from yesterday revealing some pulmonary edema.  Has not been eating much at all per nursing..   OBJECTIVE:  Vitals:   08/15/18 1017 08/15/18 1152  BP: (!) 112/50 (!) 104/59  Pulse: (!) 58 60  Resp: (!) 22 17  Temp:  97.7 F (36.5 C)  SpO2: 96% 98%    Intake/Output Summary (Last 24 hours) at 08/15/2018 1508 Last data filed at 08/15/2018 1408 Gross per 24 hour  Intake 180 ml  Output -  Net 180 ml      General: Resting, NAD HEENT: MMM [place] AT anicteric sclera Neck:  No JVD, no adenopathy CV:  Heart RRR  Lungs: Lung sounds with decreased breath sounds bilaterally.  Oxygen saturation 99% on room air Abd:  abd SNT/ND with normal BS GU:  Bladder non-palpable Extremities:  No LE edema. Skin:  No skin rash  MEDICATIONS:  . calcitRIOL  0.25 mcg Oral Daily  . Chlorhexidine Gluconate Cloth  6 each Topical Q0600  . darbepoetin (ARANESP) injection - DIALYSIS  200 mcg Intravenous Q Fri-HD  . dextrose      . escitalopram  5 mg Oral Daily  . feeding supplement (NEPRO CARB STEADY)  237 mL Oral BID BM  . feeding supplement (OSMOLITE 1.5 CAL)  1,000 mL Per Tube Q24H  . midodrine  10 mg Oral TID WC  . multivitamin  1 tablet Oral QHS  . Pancrealipase (Lip-Prot-Amyl) CPEP 6,000 units  1 capsule Oral TID WC  . pantoprazole  40 mg Oral BID  . sodium chloride flush  10-40 mL Intracatheter Q12H       LABS:   CBC Latest Ref Rng & Units 08/15/2018 08/12/2018 08/12/2018  WBC 4.0 - 10.5 K/uL 9.1 - -  Hemoglobin 12.0 - 15.0 g/dL 10.7(L) 11.2(L) 11.8(L)  Hematocrit 36.0 - 46.0 % 34.2(L) 35.0(L) 36.5  Platelets 150 - 400 K/uL 163 - -    CMP Latest Ref Rng & Units 08/15/2018 08/14/2018 08/13/2018  Glucose 70 - 99 mg/dL 92 156(H) 90  BUN 8 - 23 mg/dL  29(H) 20 9  Creatinine 0.44 - 1.00 mg/dL 3.47(H) 2.49(H) 1.62(H)  Sodium 135 - 145 mmol/L 138 135 138  Potassium 3.5 - 5.1 mmol/L 4.7 4.8 4.2  Chloride 98 - 111 mmol/L 103 103 104  CO2 22 - 32 mmol/L 28 26 24   Calcium 8.9 - 10.3 mg/dL 7.6(L) 7.0(L) 6.9(L)  Total Protein 6.5 - 8.1 g/dL - - -  Total Bilirubin 0.3 - 1.2 mg/dL - - -  Alkaline Phos 38 - 126 U/L - - -  AST 15 - 41 U/L - - -  ALT 0 - 44 U/L - - -    Lab Results  Component Value Date   PTH 70 (H) 08/08/2018   PTH Comment 08/08/2018   CALCIUM 7.6 (L) 08/15/2018   CAION 1.05 (L) 08/03/2018   PHOS 2.0 (L) 08/15/2018       Component Value Date/Time   COLORURINE YELLOW 07/17/2012 1751   APPEARANCEUR CLOUDY (A) 07/17/2012 1751   LABSPEC 1.020 07/17/2012 1751   PHURINE 5.5 07/17/2012 1751   GLUCOSEU NEGATIVE 07/17/2012 1751   HGBUR SMALL (A) 07/17/2012 1751   BILIRUBINUR NEGATIVE 07/17/2012 1751   KETONESUR NEGATIVE 07/17/2012 1751   PROTEINUR 30 (A)  07/17/2012 1751   UROBILINOGEN 0.2 07/17/2012 1751   NITRITE NEGATIVE 07/17/2012 1751   LEUKOCYTESUR SMALL (A) 07/17/2012 1751      Component Value Date/Time   PHART 7.396 08/04/2018 1345   PCO2ART 33.6 08/04/2018 1345   PO2ART 182 (H) 08/04/2018 1345   HCO3 20.2 08/04/2018 1345   TCO2 23 08/03/2018 2248   ACIDBASEDEF 3.8 (H) 08/04/2018 1345   O2SAT 99.4 08/04/2018 1345       Component Value Date/Time   IRON 111 07/31/2018 0336   TIBC 136 (L) 07/31/2018 0336   FERRITIN 1,324 (H) 07/31/2018 0336   IRONPCTSAT 82 (H) 07/31/2018 0336       ASSESSMENT/PLAN:    78 year old female patient with a past medical history significant for chronic diastolic heart failure, coronary artery disease, type 2 diabetes, hypertension, hyperlipidemia, stroke and end-stage renal disease on hemodialysis Monday, Wednesday, and Friday who presented on 07/30/2018 with altered mental status and anemia with a hemoglobin of 5.7.  An EGD revealed an AVM.  She also underwent a colonoscopy and  repeat EGD.  1.  End-stage renal disease on hemodialysis.  We will continue dialysis on Monday, Wednesday, and Friday.  Will attempt to challenge dry weight.  2.  Acute blood loss anemia.  Hemoglobin is stable and at goal.  3.  Secondary hyperparathyroidism.  PTH was 70.  Phosphorus stable at 4.1.  Will continue calcitriol given markedly low calcium level.  We will add a vitamin D supplementation for market vitamin D deficiency.   [place], DO, [place]

## 2018-08-15 NOTE — Progress Notes (Addendum)
NCM received consult: Refusing SNF. NCM made aware by daughter, [name], she will visit mom this evening to determine disposition, ? Home with home health services. States she is a [employer]. Mom doesn't typically allow people in her home. Once she does her assessment of mom she states she will f/u with NCM  regarding mom's d/c plan. Whitman Hero RN,BSN,CM

## 2018-08-15 NOTE — Progress Notes (Signed)
Pt pulled out her NG tube.  I placed another 64 French NG tube.  Just called on call for an XRAY for placement order.  Waiting on XRAY.

## 2018-08-15 NOTE — Progress Notes (Signed)
Feeding tube was restarted at 40 ml/hr. Will continue to monitor.

## 2018-08-15 NOTE — Progress Notes (Signed)
Feeding tube was hold per MD order. Will be restarted at 8 PM. Will continue to monitor.

## 2018-08-15 NOTE — Progress Notes (Signed)
Patient went to dialysis.

## 2018-08-15 NOTE — Progress Notes (Signed)
Pt CBG was 78 at 0638, then dropped to 56 at 0702. Dextrose 50 was pushed. Dr Starla Link was page. Pt was handed off to [facility] that will be monitoring her from 7a-7p.  NG tube was pulled out a least 2 times prior. NG tube was placed during night shift, waited for x-ray to confirm correct location. Pt was not taking to dialysis due to CBG results.

## 2018-08-15 NOTE — Progress Notes (Signed)
Patient has poor appetite. Staff tried to feed her for breakfast and lunch but she refused. She took one 1/2 spoon meatloaf and she refused drinks. Will continue to monitor.

## 2018-08-15 NOTE — Care Management Important Message (Signed)
Important Message  Patient Details  Name: Courtney [name] MRN: [id number] Date of Birth: 1940-03-07   Medicare Important Message Given:  Yes     Orbie Pyo 08/15/2018, 2:38 PM

## 2018-08-16 LAB — RENAL FUNCTION PANEL
Albumin: 1.7 g/dL — ABNORMAL LOW (ref 3.5–5.0)
Anion gap: 8 (ref 5–15)
BUN: 14 mg/dL (ref 8–23)
CO2: 28 mmol/L (ref 22–32)
Calcium: 7.8 mg/dL — ABNORMAL LOW (ref 8.9–10.3)
Chloride: 101 mmol/L (ref 98–111)
Creatinine, Ser: 2.41 mg/dL — ABNORMAL HIGH (ref 0.44–1.00)
GFR calc Af Amer: 22 mL/min — ABNORMAL LOW (ref >60)
GFR calc non Af Amer: 19 mL/min — ABNORMAL LOW (ref >60)
Glucose, Bld: 131 mg/dL — ABNORMAL HIGH (ref 70–99)
Phosphorus: 2.1 mg/dL — ABNORMAL LOW (ref 2.5–4.6)
Potassium: 3.9 mmol/L (ref 3.5–5.1)
Sodium: 137 mmol/L (ref 135–145)

## 2018-08-16 LAB — CBC
HCT: 32.8 % — ABNORMAL LOW (ref 36.0–46.0)
Hemoglobin: 10 g/dL — ABNORMAL LOW (ref 12.0–15.0)
MCH: 29.8 pg (ref 26.0–34.0)
MCHC: 30.5 g/dL (ref 30.0–36.0)
MCV: 97.6 fL (ref 80.0–100.0)
Platelets: 143 10*3/uL — ABNORMAL LOW (ref 150–400)
RBC: 3.36 MIL/uL — ABNORMAL LOW (ref 3.87–5.11)
RDW: 17.7 % — ABNORMAL HIGH (ref 11.5–15.5)
WBC: 6.9 10*3/uL (ref 4.0–10.5)
nRBC: 0.3 % — ABNORMAL HIGH (ref 0.0–0.2)

## 2018-08-16 LAB — GLUCOSE, CAPILLARY
Glucose-Capillary: 100 mg/dL — ABNORMAL HIGH (ref 70–99)
Glucose-Capillary: 100 mg/dL — ABNORMAL HIGH (ref 70–99)
Glucose-Capillary: 102 mg/dL — ABNORMAL HIGH (ref 70–99)
Glucose-Capillary: 138 mg/dL — ABNORMAL HIGH (ref 70–99)
Glucose-Capillary: 69 mg/dL — ABNORMAL LOW (ref 70–99)
Glucose-Capillary: 96 mg/dL (ref 70–99)
Glucose-Capillary: 98 mg/dL (ref 70–99)

## 2018-08-16 MED ORDER — CHLORHEXIDINE GLUCONATE CLOTH 2 % EX PADS
6.0000 | MEDICATED_PAD | Freq: Every day | CUTANEOUS | Status: DC
  Administered 2018-08-17 – 2018-08-18 (×2): 6 via TOPICAL

## 2018-08-16 NOTE — Progress Notes (Signed)
Calorie Count Note  48 hour calorie count ordered.  Diet: DYS 2, renal diet, 1200 ml fluid restriction  Supplements: Nepro Shake BID, Magic cup TID, Osmolite 1.5 @ 35 ml/hr   Day 1  Lunch 6/22: refused  Dinner 6/22: refused  Breakfast 6/23: 114 kcal, 5 grams protein  Supplements: 50 kcal, 5 gram protein   Total PO intake: 164 kcal (5% of minimum estimated needs)  10 protein (18% of minimum estimated needs)  Estimated Nutritional Needs:  Kcal:  1250-1500 Protein:  55-65 gm Fluid:  1062ml + UOP  Nutrition Dx: Severe Malnutrition related to chronic illness as evidenced by severe fat depletion, severe muscle depletion.- Ongoing  Goal: Patient will meet greater than or equal to 90% of their needs- not meeting  Intervention: Nepro Shake BID, Magic cup TID, Osmolite 1.5 @ 35 ml/hr x 12 hours (provides 630 kcal and 26 grams protein).   Mariana Single RD, LDN Clinical Nutrition Pager # [phone]

## 2018-08-16 NOTE — Progress Notes (Addendum)
1015: Spoke with daughter. Updated on condition. Will be up later.  1530: daughter at bedside

## 2018-08-16 NOTE — Progress Notes (Signed)
Patient ID: Courtney [name], female   DOB: 12/03/1940, 78 y.o.   MRN: [id number]  PROGRESS NOTE    Courtney [name]  BSW:[id number] DOB: [birth year] DOA: 07/30/2018 PCP: Neale Burly, MD   Brief Narrative:  78 year old female with history of end-stage renal disease on hemodialysis, chronic diastolic heart failure, CAD, diabetes mellitus type 2, hypertension, hyperlipidemia, stroke presented on 07/30/2018 with altered mental status and hemoglobin of 5.7.  GI and nephrology were consulted.  She was transfused 2 units packed red cells.  CT of the abdomen and pelvis showed small amount of gas in the endometrial canal of uncertain etiology with bilateral pleural effusions.  This was discussed with GYN on-call who did not recommend any further work-up.  Patient has had a long hospitalization.  Initially there was no plans for any endoscopic procedures but patient continued to have intermittent blood in stools.  She required more packed red cell transfusion.  Patient was transferred to ICU on 08/03/2018.  Tagged red cell study was positive with concern for proximal bleeding source such as duodenum or stomach.  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  Patient also underwent left-sided thoracentesis on 08/05/2018 for left pleural effusion and subsequently developed pneumothorax for which chest tube was placed.  Patient again had capsule bleeding on 08/06/2018 and hemoglobin had dropped to 6.7 from 7.7.  She was subsequently transfused packed red cells again.  She underwent colonoscopy on 08/07/2018 which showed old blood in the entire colon, no active bleeding noted on lavage, few polyps noted but not removed and terminal ileum showed old blood as well.  She had repeat EGD on 08/07/2018 which showed small amount of blood in fundus, duodenal ulcer and visible vessel without active bleeding for which one Endo Clip was placed.  Patient was continued on Protonix  drip.  Small bowel PillCam was placed.  Chest tube was removed on 08/09/2018. subsequently, her oral intake has been very poor.  NG tube feeding has been started.  She was transferred back to [facility] service on 08/12/2018.  Assessment & Plan:  Acute on chronic blood loss anemia Upper GI bleeding most likely duodenal bleed -Patient presented with hemoglobin of 5.7.  Has had 7 units of packed red cells transfusion since admission, last transfusion was on 08/10/2018. -Hemoglobin 10 today.  No overnight black or bloody stools -Tagged red cell study was positive with concern for proximal bleeding source such as duodenum or stomach. -Status post EGD on 08/03/2018 which showed duodenal AVM which was injected and treated with gold probe.   -Status post  colonoscopy on 08/07/2018 which showed old blood in the entire colon, no active bleeding noted on lavage, few polyps noted but not removed and terminal ileum showed old blood as well.  She had repeat EGD on 08/07/2018 which showed small amount of blood in fundus, duodenal ulcer and visible vessel without active bleeding for which one Endo Clip was placed. -Continue twice a day Protonix.  GI recommends PPI twice a day for at least 4 weeks thereafter once a day indefinitely.  Aspirin and Plavix on hold.  If absolutely needed, GI recommends aspirin 81 mg only, possibly after a week.  GI has signed off.  Outpatient follow-up with GI.  Iatrogenic pneumothorax -Resolved.  Status post removal of chest tube on 08/09/2018.  PCCM has signed off  End-stage renal disease on hemodialysis Hypotension -Dialysis as per nephrology schedule -Blood pressure on the  lower side but stable.  Continue midodrine.  Recurrent pleural effusions -Status post left thoracentesis with subsequent pneumothorax and chest tube placement on 08/05/2018.  Continue volume removal as per HD.  Defer further thoracentesis unless patient is in severe respiratory  distress  Malnutrition Hypoalbuminemia -Patient continues to refuse to eat.  Patient again pulled out her NG tube early this morning and subsequently her blood sugar dropped to the 50s.  NG tube has been placed back again and feeding has been restarted. -Patient/family not interested in pursuing PEG tube feeding.  I had an extensive discussion with [name]/daughter on the phone again on 08/15/2018 and she states that her mother does not like the hospital food and would not want to have PEG tube as she might pull it out as well.  Putting in NG tube every other day since the patient keeps pulling the NG tube out, is also not a solution.  Since patient is full code and not interested in hospice and does not want to go to SNF, I encouraged [name] to visit her mom, talk to her mom and may be encourage her to eat more.  Mother thinks that her mom would eat better at home. -Spoke to [name]/daughter again on phone on 08/16/2018, she will try to come to the hospital today and see how her mother is doing and encouraged her to eat more.  She will discuss with her mother and brother about further disposition plan. -I have ordered calorie count as well.  Generalized deconditioning History of stroke/severe debility -Aspirin and Plavix on hold. -Overall prognosis is guarded to poor.  Palliative care evaluation appreciated.  Patient remains full code at this time.  Patient does not want a PEG tube. -Consider hospice/comfort measures.  Currently patient/family not interested.  CAD -Stable.  Aspirin/Plavix on hold  Diabetes mellitus type II -Blood sugars intermittently on the lower side.  Acute hypoxic respiratory failure COPD -Was briefly intubated for procedure. -Resolved. -Currently on room air. -Continue PRN bronchodilators and pulmonary toiletry. -X-ray status stable currently.  Chronic diastolic CHF -Volume managed by dialysis.    DVT prophylaxis: SCDs Code Status:  Full Family  Communication: Spoke to daughter/[name] over the phone on 08/12/2018, 08/15/2018 and 08/16/2018. Disposition Plan: Patient/family refusing SNF placement.  Home with home health in the next 1 to 2 days if daughter/family agreeable Consultants: PCCM/nephrology/GI/IR  Procedures:  EGD 08/03/2018-08/04/2018  EGD on 08/03/2018 which showed duodenal AVM which was injected and treated with gold probe.   Left thoracentesis on 08/05/2018 Left chest tube placement on 08/05/2018 and removal on 08/09/2018 Left IJ central venous catheter placement on 08/07/2018 -Status post  colonoscopy on 08/07/2018 which showed old blood in the entire colon, no active bleeding noted on lavage, few polyps noted but not removed and terminal ileum showed old blood as well.  She had repeat EGD on 08/07/2018 which showed small amount of blood in fundus, duodenal ulcer and visible vessel without active bleeding for which one Endo Clip was placed.  Antimicrobials:  None  Subjective: Patient seen and examined at bedside.  She is a poor historian.  She is very sleepy, wakes up only very slightly.  Hardly participates in any conversation.  No overnight fever, black or bloody stools reported.  Currently has NG tube in.    Objective: Vitals:   08/16/18 0439 08/16/18 0529 08/16/18 0557 08/16/18 0837  BP: 100/60 (!) 99/42  (!) 106/50  Pulse:  63  63  Resp:  (!) 24  20  Temp:  98.2 F (36.8 C)  TempSrc:    Oral  SpO2:  95%  95%  Weight:   33.4 kg   Height:        Intake/Output Summary (Last 24 hours) at 08/16/2018 1010 Last data filed at 08/16/2018 0347 Gross per 24 hour  Intake 2844 ml  Output 1983 ml  Net 861 ml   Filed Weights   08/15/18 1930 08/15/18 2236 08/16/18 0557  Weight: 34.3 kg 32.2 kg 33.4 kg    Examination:  General exam: No distress.  Looks chronically ill.  Has NG tube in place currently.  Extremely thinly built.  Sleepy, hardly wakes up on calling her name.   Respiratory system: Bilateral decreased breath  sounds at bases with scattered crackles.  No wheezing.  Intermittently tachypneic. Cardiovascular system: S1-S2 heard, rate controlled Gastrointestinal system: Abdomen is nondistended, soft and nontender. Normal bowel sounds heard. Extremities: No cyanosis; trace edema   Data Reviewed: I have personally reviewed following labs and imaging studies  CBC: Recent Labs  Lab 08/10/18 0401  08/11/18 0430  08/12/18 0401 08/12/18 1103 08/12/18 2155 08/15/18 0334 08/16/18 0631  WBC 6.3  --  11.3*  --  10.0  --   --  9.1 6.9  NEUTROABS  --   --  10.3*  --   --   --   --   --   --   HGB 6.4*   < > 11.0*   < > 10.7* 11.8* 11.2* 10.7* 10.0*  HCT 20.0*   < > 33.8*   < > 32.9* 36.5 35.0* 34.2* 32.8*  MCV 93.9  --  91.8  --  92.7  --   --  96.1 97.6  PLT 76*  --  71*  --  81*  --   --  163 143*   < > = values in this interval not displayed.   Basic Metabolic Panel: Recent Labs  Lab 08/10/18 0401 08/11/18 0430 08/12/18 0401 08/13/18 0713 08/14/18 0326 08/15/18 0334 08/16/18 0356  NA 136 139 136 138 135 138 137  K 3.3* 3.8 4.5 4.2 4.8 4.7 3.9  CL 107 107 106 104 103 103 101  CO2 24 26 23 24 26 28 28   GLUCOSE 383* 97 137* 90 156* 92 131*  BUN 23 7* 18 9 20  29* 14  CREATININE 3.10* 2.09* 2.80* 1.62* 2.49* 3.47* 2.41*  CALCIUM 6.8* 7.1* 6.6* 6.9* 7.0* 7.6* 7.8*  MG 1.5* 2.0 2.0  --   --   --   --   PHOS 4.1 2.0* 4.1 2.6 2.1* 2.0* 2.1*   GFR: Estimated Creatinine Clearance: 10.3 mL/min (A) (by C-G formula based on SCr of 2.41 mg/dL (H)). Liver Function Tests: Recent Labs  Lab 08/12/18 0401 08/13/18 0713 08/14/18 0326 08/15/18 0334 08/16/18 0356  ALBUMIN 1.7* 1.6* 1.6* 1.8* 1.7*   No results for input(s): LIPASE, AMYLASE in the last 168 hours. No results for input(s): AMMONIA in the last 168 hours. Coagulation Profile: No results for input(s): INR, PROTIME in the last 168 hours. Cardiac Enzymes: No results for input(s): CKTOTAL, CKMB, CKMBINDEX, TROPONINI in the last 168  hours. BNP (last 3 results) No results for input(s): PROBNP in the last 8760 hours. HbA1C: No results for input(s): HGBA1C in the last 72 hours. CBG: Recent Labs  Lab 08/15/18 2126 08/16/18 0008 08/16/18 0125 08/16/18 0459 08/16/18 0840  GLUCAP 91 69* 102* 138* 100*   Lipid Profile: No results for input(s): CHOL, HDL, LDLCALC, TRIG, CHOLHDL, LDLDIRECT in  the last 72 hours. Thyroid Function Tests: No results for input(s): TSH, T4TOTAL, FREET4, T3FREE, THYROIDAB in the last 72 hours. Anemia Panel: No results for input(s): VITAMINB12, FOLATE, FERRITIN, TIBC, IRON, RETICCTPCT in the last 72 hours. Sepsis Labs: No results for input(s): PROCALCITON, LATICACIDVEN in the last 168 hours.  No results found for this or any previous visit (from the past 240 hour(s)).       Radiology Studies: Dg Abd 1 View  Result Date: 08/15/2018 CLINICAL DATA:  NG tube advancement. EXAM: ABDOMEN - 1 VIEW 9:53 a.m. COMPARISON:  08/15/2018 at 6:27 a.m. FINDINGS: The NG tube has been advanced and the tip is now in the distal stomach. Bowel gas pattern is normal. Large left pleural effusion. Small right pleural effusion. No acute bone abnormality. IMPRESSION: NG tube now in good position in the distal stomach. No other significant change. Electronically Signed   By: Lorriane Shire M.D.   On: 08/15/2018 10:35   Dg Abd 1 View  Result Date: 08/15/2018 CLINICAL DATA:  NG tube placement. EXAM: ABDOMEN - 1 VIEW COMPARISON:  08/13/2018. FINDINGS: NG tube noted with tip over the upper portion of the stomach. Side hole is noted above the gastroesophageal junction. NG tube advancement of approximately 15 cm should be considered. Nondistended air-filled loops of small bowel again noted. Colonic gas pattern is normal. No free air. Cardiomegaly with bilateral pulmonary infiltrates/edema and bilateral pleural effusions again noted. IMPRESSION: 1. NG tube noted with tip over the upper portion of the stomach. Side hole is  noted above the gastroesophageal junction. NG tube advancement of approximately 15 cm should be considered. 2.  Nondistended air-filled loops of small bowel again noted. 3. Cardiomegaly with bilateral pulmonary infiltrates/edema bilateral pleural effusions again noted. Electronically Signed   By: Marcello Moores  Register   On: 08/15/2018 07:58        Scheduled Meds:  calcitRIOL  0.25 mcg Oral Daily   Chlorhexidine Gluconate Cloth  6 each Topical Q0600   cholecalciferol  2,000 Units Oral Daily   darbepoetin (ARANESP) injection - DIALYSIS  200 mcg Intravenous Q Fri-HD   escitalopram  5 mg Oral Daily   feeding supplement (NEPRO CARB STEADY)  237 mL Oral BID BM   feeding supplement (OSMOLITE 1.5 CAL)  1,000 mL Per Tube Q24H   midodrine  10 mg Oral TID WC   multivitamin  1 tablet Oral QHS   Pancrealipase (Lip-Prot-Amyl) CPEP 6,000 units  1 capsule Oral TID WC   pantoprazole  40 mg Oral BID   sodium chloride flush  10-40 mL Intracatheter Q12H   Continuous Infusions:    LOS: 17 days        Aline August, MD [facility] 08/16/2018, 10:10 AM

## 2018-08-16 NOTE — Progress Notes (Signed)
[facility]    NEPHROLOGY PROGRESS NOTE  SUBJECTIVE: Patient seen and examined.  Has not been eating much at all per nursing.  Very weak.  Plan is for eventual home with therapy- has refused SNF.  Daughter at bedside, is nurse but seems possibly a little unrealistic ?  HD last night removed 1900 tolerated well    OBJECTIVE:  Vitals:   08/16/18 1240 08/16/18 1622  BP: (!) 109/43 (!) 117/39  Pulse: (!) 59 63  Resp: 15 16  Temp: 98 F (36.7 C) 98 F (36.7 C)  SpO2: 97% 95%    Intake/Output Summary (Last 24 hours) at 08/16/2018 1631 Last data filed at 08/16/2018 0623 Gross per 24 hour  Intake 2724 ml  Output 1983 ml  Net 741 ml      General: Resting, NAD HEENT: MMM  AT anicteric sclera Neck:  No JVD, no adenopathy CV:  Heart RRR  Lungs: Lung sounds with decreased breath sounds bilaterally.  Oxygen saturation 99% on room air Abd:  abd SNT/ND with normal BS GU:  Bladder non-palpable Extremities:  Pitting edema to dep areas- right upper AVG- patent Skin:  No skin rash  MEDICATIONS:  . calcitRIOL  0.25 mcg Oral Daily  . Chlorhexidine Gluconate Cloth  6 each Topical Q0600  . cholecalciferol  2,000 Units Oral Daily  . darbepoetin (ARANESP) injection - DIALYSIS  200 mcg Intravenous Q Fri-HD  . escitalopram  5 mg Oral Daily  . feeding supplement (NEPRO CARB STEADY)  237 mL Oral BID BM  . feeding supplement (OSMOLITE 1.5 CAL)  1,000 mL Per Tube Q24H  . midodrine  10 mg Oral TID WC  . multivitamin  1 tablet Oral QHS  . Pancrealipase (Lip-Prot-Amyl) CPEP 6,000 units  1 capsule Oral TID WC  . pantoprazole  40 mg Oral BID  . sodium chloride flush  10-40 mL Intracatheter Q12H       LABS:   CBC Latest Ref Rng & Units 08/16/2018 08/15/2018 08/12/2018  WBC 4.0 - 10.5 K/uL 6.9 9.1 -  Hemoglobin 12.0 - 15.0 g/dL 10.0(L) 10.7(L) 11.2(L)  Hematocrit 36.0 - 46.0 % 32.8(L) 34.2(L) 35.0(L)  Platelets 150 - 400 K/uL 143(L) 163 -    CMP Latest Ref Rng & Units 08/16/2018  08/15/2018 08/14/2018  Glucose 70 - 99 mg/dL 131(H) 92 156(H)  BUN 8 - 23 mg/dL 14 29(H) 20  Creatinine 0.44 - 1.00 mg/dL 2.41(H) 3.47(H) 2.49(H)  Sodium 135 - 145 mmol/L 137 138 135  Potassium 3.5 - 5.1 mmol/L 3.9 4.7 4.8  Chloride 98 - 111 mmol/L 101 103 103  CO2 22 - 32 mmol/L 28 28 26   Calcium 8.9 - 10.3 mg/dL 7.8(L) 7.6(L) 7.0(L)  Total Protein 6.5 - 8.1 g/dL - - -  Total Bilirubin 0.3 - 1.2 mg/dL - - -  Alkaline Phos 38 - 126 U/L - - -  AST 15 - 41 U/L - - -  ALT 0 - 44 U/L - - -    Lab Results  Component Value Date   PTH 70 (H) 08/08/2018   PTH Comment 08/08/2018   CALCIUM 7.8 (L) 08/16/2018   CAION 1.05 (L) 08/03/2018   PHOS 2.1 (L) 08/16/2018       Component Value Date/Time   COLORURINE YELLOW 07/17/2012 1751   APPEARANCEUR CLOUDY (A) 07/17/2012 1751   LABSPEC 1.020 07/17/2012 1751   PHURINE 5.5 07/17/2012 1751   GLUCOSEU NEGATIVE 07/17/2012 1751   HGBUR SMALL (A) 07/17/2012 1751   BILIRUBINUR NEGATIVE  07/17/2012 1751   [place] 07/17/2012 1751   PROTEINUR 30 (A) 07/17/2012 1751   UROBILINOGEN 0.2 07/17/2012 1751   NITRITE NEGATIVE 07/17/2012 1751   LEUKOCYTESUR SMALL (A) 07/17/2012 1751      Component Value Date/Time   PHART 7.396 08/04/2018 1345   PCO2ART 33.6 08/04/2018 1345   PO2ART 182 (H) 08/04/2018 1345   HCO3 20.2 08/04/2018 1345   TCO2 23 08/03/2018 2248   ACIDBASEDEF 3.8 (H) 08/04/2018 1345   O2SAT 99.4 08/04/2018 1345       Component Value Date/Time   IRON 111 07/31/2018 0336   TIBC 136 (L) 07/31/2018 0336   FERRITIN 1,324 (H) 07/31/2018 0336   IRONPCTSAT 82 (H) 07/31/2018 0336       ASSESSMENT/PLAN:    78 year old female patient with chronic diastolic heart failure, coronary artery disease, type 2 diabetes, hypertension, hyperlipidemia, stroke and end-stage renal disease on hemodialysis Monday, Wednesday, and Friday who presented on 07/30/2018 with altered mental status and anemia with a hemoglobin of 5.7.  An EGD revealed an AVM.   She also underwent a colonoscopy and repeat EGD.  1.  End-stage renal disease on hemodialysis.  We will continue dialysis on Monday, Wednesday, and Friday via AVG .  Will attempt to challenge dry weight.  2.  Acute blood loss anemia.  ON PPI per GI.  Hemoglobin trending down but is 10 today  - aranesp 200 q Friday  3.  Secondary hyperparathyroidism.  PTH was 70.  Phosphorus stable at 4.1.  Will continue calcitriol given markedly low calcium level.  We will add a vitamin D supplementation for market vitamin D deficiency.  4. HTN/vol-  Seems overloaded UF as able- BP lowish on chronic midodrine   5. Pneumothorax-- s/p chest tube, removed and stable  6. Malnutrition- albumin in the 1's- not sure if there is plan for feeding tube- daughter says not not right now   [place]

## 2018-08-16 NOTE — Plan of Care (Signed)
  Problem: Nutritional: Goal: Ability to make healthy dietary choices will improve Outcome: Not Progressing  Patient refusing meals. Supplements provided, patient refuses. PO encouraged.

## 2018-08-16 NOTE — Progress Notes (Signed)
Patient's Blood pressure was 99/42. On call provider notified no new order received. Will continue monitor the patient.

## 2018-08-17 DIAGNOSIS — E1169 Type 2 diabetes mellitus with other specified complication: Secondary | ICD-10-CM

## 2018-08-17 DIAGNOSIS — E785 Hyperlipidemia, unspecified: Secondary | ICD-10-CM

## 2018-08-17 DIAGNOSIS — I679 Cerebrovascular disease, unspecified: Secondary | ICD-10-CM

## 2018-08-17 LAB — CBC
HCT: 33.3 % — ABNORMAL LOW (ref 36.0–46.0)
Hemoglobin: 10.1 g/dL — ABNORMAL LOW (ref 12.0–15.0)
MCH: 29.4 pg (ref 26.0–34.0)
MCHC: 30.3 g/dL (ref 30.0–36.0)
MCV: 97.1 fL (ref 80.0–100.0)
Platelets: 201 10*3/uL (ref 150–400)
RBC: 3.43 MIL/uL — ABNORMAL LOW (ref 3.87–5.11)
RDW: 17.6 % — ABNORMAL HIGH (ref 11.5–15.5)
WBC: 7 10*3/uL (ref 4.0–10.5)
nRBC: 0 % (ref 0.0–0.2)

## 2018-08-17 LAB — RENAL FUNCTION PANEL
Albumin: 1.7 g/dL — ABNORMAL LOW (ref 3.5–5.0)
Anion gap: 10 (ref 5–15)
BUN: 24 mg/dL — ABNORMAL HIGH (ref 8–23)
CO2: 25 mmol/L (ref 22–32)
Calcium: 7.5 mg/dL — ABNORMAL LOW (ref 8.9–10.3)
Chloride: 100 mmol/L (ref 98–111)
Creatinine, Ser: 3.22 mg/dL — ABNORMAL HIGH (ref 0.44–1.00)
GFR calc Af Amer: 15 mL/min — ABNORMAL LOW (ref >60)
GFR calc non Af Amer: 13 mL/min — ABNORMAL LOW (ref >60)
Glucose, Bld: 133 mg/dL — ABNORMAL HIGH (ref 70–99)
Phosphorus: 2.3 mg/dL — ABNORMAL LOW (ref 2.5–4.6)
Potassium: 5.7 mmol/L — ABNORMAL HIGH (ref 3.5–5.1)
Sodium: 135 mmol/L (ref 135–145)

## 2018-08-17 LAB — GLUCOSE, CAPILLARY
Glucose-Capillary: 90 mg/dL (ref 70–99)
Glucose-Capillary: 133 mg/dL — ABNORMAL HIGH (ref 70–99)
Glucose-Capillary: 75 mg/dL (ref 70–99)
Glucose-Capillary: 107 mg/dL — ABNORMAL HIGH (ref 70–99)
Glucose-Capillary: 98 mg/dL (ref 70–99)

## 2018-08-17 MED ORDER — PENTAFLUOROPROP-TETRAFLUOROETH EX AERO: 1.0000 "application " | INHALATION_SPRAY | CUTANEOUS | Status: DC | PRN

## 2018-08-17 MED ORDER — LIDOCAINE-PRILOCAINE 2.5-2.5 % EX CREA
1.0000 "application " | TOPICAL_CREAM | CUTANEOUS | Status: DC | PRN
  Filled 2018-08-17: qty 5

## 2018-08-17 MED ORDER — HEPARIN SODIUM (PORCINE) 1000 UNIT/ML DIALYSIS: 1000.0000 [IU] | INTRAMUSCULAR | Status: DC | PRN

## 2018-08-17 MED ORDER — LIDOCAINE HCL (PF) 1 % IJ SOLN
5.0000 mL | INTRAMUSCULAR | Status: DC | PRN
  Filled 2018-08-17: qty 5

## 2018-08-17 MED ORDER — SODIUM CHLORIDE 0.9 % IV SOLN: 100.0000 mL | INTRAVENOUS | Status: DC | PRN

## 2018-08-17 MED ORDER — PRO-STAT SUGAR FREE PO LIQD
30.0000 mL | Freq: Three times a day (TID) | ORAL | Status: DC
  Administered 2018-08-17 – 2018-08-18 (×4): 30 mL via ORAL
  Filled 2018-08-17 (×6): qty 30

## 2018-08-17 MED ORDER — ALTEPLASE 2 MG IJ SOLR: 2.0000 mg | Freq: Once | INTRAMUSCULAR | Status: DC | PRN

## 2018-08-17 NOTE — Progress Notes (Signed)
[facility]    NEPHROLOGY PROGRESS NOTE  SUBJECTIVE: Patient s/p HD this AM- removed 1100- had a low BP.   Very weak.  Plan is for eventual home with therapy- has refused SNF.  Barely arousable to my exam this afternoon   OBJECTIVE:  Vitals:   08/17/18 1025 08/17/18 1115  BP: (!) 94/46 (!) 110/39  Pulse: (!) 57 (!) 57  Resp: 18 16  Temp: 98 F (36.7 C) (!) 97.5 F (36.4 C)  SpO2: 98%     Intake/Output Summary (Last 24 hours) at 08/17/2018 1521 Last data filed at 08/17/2018 1346 Gross per 24 hour  Intake 230 ml  Output 1134 ml  Net -904 ml      General: Resting, NAD- very weak- barely arousable  HEENT: MMM [place] AT anicteric sclera Neck:  No JVD, no adenopathy CV:  Heart RRR  Lungs: Lung sounds with decreased breath sounds bilaterally.  Oxygen saturation 99% on room air Abd:  abd SNT/ND with normal BS GU:  Bladder non-palpable Extremities:  Pitting edema to dep areas- right upper AVG- patent Skin:  No skin rash  MEDICATIONS:  . calcitRIOL  0.25 mcg Oral Daily  . Chlorhexidine Gluconate Cloth  6 each Topical Q0600  . cholecalciferol  2,000 Units Oral Daily  . darbepoetin (ARANESP) injection - DIALYSIS  200 mcg Intravenous Q Fri-HD  . escitalopram  5 mg Oral Daily  . feeding supplement (OSMOLITE 1.5 CAL)  1,000 mL Per Tube Q24H  . feeding supplement (PRO-STAT SUGAR FREE 64)  30 mL Oral TID WC  . midodrine  10 mg Oral TID WC  . multivitamin  1 tablet Oral QHS  . Pancrealipase (Lip-Prot-Amyl) CPEP 6,000 units  1 capsule Oral TID WC  . pantoprazole  40 mg Oral BID  . sodium chloride flush  10-40 mL Intracatheter Q12H       LABS:   CBC Latest Ref Rng & Units 08/17/2018 08/16/2018 08/15/2018  WBC 4.0 - 10.5 K/uL 7.0 6.9 9.1  Hemoglobin 12.0 - 15.0 g/dL 10.1(L) 10.0(L) 10.7(L)  Hematocrit 36.0 - 46.0 % 33.3(L) 32.8(L) 34.2(L)  Platelets 150 - 400 K/uL 201 143(L) 163    CMP Latest Ref Rng & Units 08/17/2018 08/16/2018 08/15/2018  Glucose 70 - 99 mg/dL  133(H) 131(H) 92  BUN 8 - 23 mg/dL 24(H) 14 29(H)  Creatinine 0.44 - 1.00 mg/dL 3.22(H) 2.41(H) 3.47(H)  Sodium 135 - 145 mmol/L 135 137 138  Potassium 3.5 - 5.1 mmol/L 5.7(H) 3.9 4.7  Chloride 98 - 111 mmol/L 100 101 103  CO2 22 - 32 mmol/L 25 28 28   Calcium 8.9 - 10.3 mg/dL 7.5(L) 7.8(L) 7.6(L)  Total Protein 6.5 - 8.1 g/dL - - -  Total Bilirubin 0.3 - 1.2 mg/dL - - -  Alkaline Phos 38 - 126 U/L - - -  AST 15 - 41 U/L - - -  ALT 0 - 44 U/L - - -    Lab Results  Component Value Date   PTH 70 (H) 08/08/2018   PTH Comment 08/08/2018   CALCIUM 7.5 (L) 08/17/2018   CAION 1.05 (L) 08/03/2018   PHOS 2.3 (L) 08/17/2018       Component Value Date/Time   COLORURINE YELLOW 07/17/2012 1751   APPEARANCEUR CLOUDY (A) 07/17/2012 1751   LABSPEC 1.020 07/17/2012 1751   PHURINE 5.5 07/17/2012 1751   GLUCOSEU NEGATIVE 07/17/2012 1751   HGBUR SMALL (A) 07/17/2012 1751   BILIRUBINUR NEGATIVE 07/17/2012 1751   KETONESUR NEGATIVE 07/17/2012 1751  PROTEINUR 30 (A) 07/17/2012 1751   UROBILINOGEN 0.2 07/17/2012 1751   NITRITE NEGATIVE 07/17/2012 1751   LEUKOCYTESUR SMALL (A) 07/17/2012 1751      Component Value Date/Time   PHART 7.396 08/04/2018 1345   PCO2ART 33.6 08/04/2018 1345   PO2ART 182 (H) 08/04/2018 1345   HCO3 20.2 08/04/2018 1345   TCO2 23 08/03/2018 2248   ACIDBASEDEF 3.8 (H) 08/04/2018 1345   O2SAT 99.4 08/04/2018 1345       Component Value Date/Time   IRON 111 07/31/2018 0336   TIBC 136 (L) 07/31/2018 0336   FERRITIN 1,324 (H) 07/31/2018 0336   IRONPCTSAT 82 (H) 07/31/2018 0336       ASSESSMENT/PLAN:    78 year old female patient with chronic diastolic heart failure, coronary artery disease, type 2 diabetes, hypertension, hyperlipidemia, stroke and end-stage renal disease on hemodialysis Monday, Wednesday, and Friday who presented on 07/30/2018 with altered mental status and anemia with a hemoglobin of 5.7.  An EGD revealed an AVM.  She also underwent a colonoscopy and  repeat EGD.  1.  End-stage renal disease on hemodialysis.  We will continue dialysis on Monday, Wednesday, and Friday via AVG .  Will attempt to challenge dry weight- did not happen today due to low BP.  Pre HD k 5.7- on 2 K bath.  Normally TTS at [facility] - will need to clarify at discharge   2.  Acute blood loss anemia.  ON PPI per GI.  Hemoglobin trending down but is 10.1 today  - aranesp 200 q Friday- iron stores were OK early in hosp   3.  Secondary hyperparathyroidism.  PTH was 70.  Phosphorus stable at 2.3- no binder.  Will continue calcitriol given markedly low calcium level.  We will add a vitamin D supplementation for market vitamin D deficiency.  4. HTN/vol-  Seems overloaded UF as able- BP lowish on chronic midodrine   5. Pneumothorax-- s/p chest tube, removed and stable  6. Malnutrition- albumin in the 1's- not sure if there is plan for feeding tube- daughter says not not right now   Encourage PO's.  Significant FTT  7. Dispo-  Making sure daughter has supplies that she needs to care for her and that home therapies in place -  Were hoping for soon, by end of the week      Courtney [name]

## 2018-08-17 NOTE — Progress Notes (Signed)
Renal Navigator has confirmed with [name] OP HD clinic that patient has a TTS schedule with a seat time of 11:15am.  Alphonzo Cruise, [place] Renal Navigator [phone]

## 2018-08-17 NOTE — Plan of Care (Signed)
  Problem: Nutrition: Goal: Adequate nutrition will be maintained Outcome: Not Progressing  Patient continues to refuse meals. Dietician following case.

## 2018-08-17 NOTE — Progress Notes (Addendum)
Calorie Count Note  48 hour calorie count ordered.  Diet: DYS 2, renal diet, 1200 ml fluid restriction  Supplements: Nepro Shake BID, Magic Cup TID, Osmolite 1.5 @ 35 ml/hr x 12 hours  Spoke with RN, who reports pt with no intake today. Pt reports refusing meals. She is no linger taking Nepro shakes (per pt daughter, she does not like them). Per RN, pt may d/c home tomorrow. Per palliative care team, pt does not desire PEG or hospice.   6/22 Breakfast: refused Lunch: 167 grams protein, 10 grams protein Dinner: refused Supplements: a few sips of Nepro (40 kcals, 2 grams protein)  Total intake: 207 kcal (17% of minimum estimated needs)  12 grams protein (22% of minimum estimated needs)  6/23 Breakfast: 114 kcal, 5 grams protein  Lunch: 41 kcals, 3 grams protein Dinner: 11 kcals, 0 grams protein Supplements: 50 kcal, 5 gram protein   Total intake: 216 kcal (17% of minimum estimated needs)  10 grams protein (18% of minimum estimated needs)  Average Total intake (PO's only): 212 kcal (17% of minimum estimated needs)  11 grams protein (20% of minimum estimated needs)  Average Total intake (PO's and TF): 842 kcal (67% of minimum estimated needs)  37 grams protein (67% of minimum estimated needs)  Nutrition Dx: Severe Malnutritionrelated to chronic illnessas evidenced by severe fat depletion, severe muscle depletion.- Ongoing  Goal: Patient will meet greater than or equal to 90% of their needs- not meeting  Intervention:   -D/c calorie count -D/c Nepro shake -30 ml Prostat BID, each supplement provides 100 kcals and 15 grams protein -Continue Magic cup TID with meals, each supplement provides 290 kcal and 9 grams of protein -Continue nocturnal feedings of Osmolite 1.5 @ 35 ml/hr via NGT over 12 hour period (2000-0800) in attempt to stimulate appetite. Regimen will provides 630 kcals, 26 grams protein, and 320 ml free water daily, meeting 50% of estimated kcal needs and 47% of  estimated protein needs  Lyonel Morejon A. Jimmye Norman, RD, LDN, [place] Registered Dietitian II Certified Diabetes Care and Education Specialist Pager: [phone] After hours Pager: [phone]

## 2018-08-17 NOTE — Progress Notes (Signed)
Patient's daughter Neil Errickson updated about plan of care via phone.

## 2018-08-17 NOTE — Progress Notes (Addendum)
0730: Patient down to dialysis.   1115: Patient back from dialysis. 1.1 liters removed over three hours, bed weight 31.7kg per dialysis nurse.  1148: Call to daughter [name]. Updated on condition. States she is waiting to hear back from landlord to see if patient is able to be d/c'ed. Asking if a bed will come with patient. Explained this nurse could call CM and have her speak with her. Verbalized understanding.

## 2018-08-17 NOTE — Progress Notes (Signed)
PROGRESS NOTE    Courtney [name]  WPY:[id number] DOB: 1940-11-05 DOA: 07/30/2018 PCP: Neale Burly, MD   Brief Narrative:  HPI on 07/30/2018 by Dr. Creed Copper is a 78 y.o. female with medical history significant of ESRD ON HD, TThsat, diastolic heart failure, stroke, CAD , DM, hypertension, hyperlipidemia presents to ED from HD for hypotension. Minimal history from the patient, her son at bedside and reports she has been lethargic, slightly confused and weak since yesterday. No bloody stools at home.  No fevers or chills, no sob or chest pain. Pt denies any complaints. She would answer simple questions and go to sleep. Son reports she never got colonoscopy in the past , as she was high risk for anesthetic complications.   Interim history Admitted for AMS, anemia. She was transfused 2 units packed red cells.  CT of the abdomen and pelvis showed small amount of gas in the endometrial canal of uncertain etiology with bilateral pleural effusions.  This was discussed with GYN on-call who did not recommend any further work-up.  Patient has had a long hospitalization.  Initially there was no plans for any endoscopic procedures but patient continued to have intermittent blood in stools.  She required more packed red cell transfusion.  Patient was transferred to ICU on 08/03/2018.  Tagged red cell study was positive with concern for proximal bleeding source such as duodenum or stomach.  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  Patient also underwent left-sided thoracentesis on 08/05/2018 for left pleural effusion and subsequently developed pneumothorax for which chest tube was placed.  Patient again had capsule bleeding on 08/06/2018 and hemoglobin had dropped to 6.7 from 7.7.  She was subsequently transfused packed red cells again.  She underwent colonoscopy on 08/07/2018 which showed old blood in the entire colon, no active bleeding  noted on lavage, few polyps noted but not removed and terminal ileum showed old blood as well.  She had repeat EGD on 08/07/2018 which showed small amount of blood in fundus, duodenal ulcer and visible vessel without active bleeding for which one Endo Clip was placed.  Patient was continued on Protonix drip.  Small bowel PillCam was placed.  Chest tube was removed on 08/09/2018. subsequently, her oral intake has been very poor.  NG tube feeding has been started.  She was transferred back to [facility] service on 08/12/2018. Assessment & Plan   Acute on chronic blood loss anemia/ Upper GI bleeding  -most likely duodenal bleed -Patient presented with hemoglobin of 5.7.  Has had 7 units of packed red cells transfusion since admission, last transfusion was on 08/10/2018. -Hemoglobin 10.1 today.  No overnight black or bloody stools -Gastroenterology consulted and appreciated -Tagged red cell study was positive with concern for proximal bleeding source such as duodenum or stomach. -Status post EGD on 08/03/2018 which showed duodenal AVM which was injected and treated with gold probe.   -Status post  colonoscopy on 08/07/2018 which showed old blood in the entire colon, no active bleeding noted on lavage, few polyps noted but not removed and terminal ileum showed old blood as well.  She had repeat EGD on 08/07/2018 which showed small amount of blood in fundus, duodenal ulcer and visible vessel without active bleeding for which one Endo Clip was placed. -Continue twice a day Protonix.  GI recommends PPI twice a day for at least 4 weeks thereafter once a day indefinitely.  Aspirin and Plavix on hold.  If absolutely needed, GI recommends aspirin 81 mg only, possibly after a week.  GI has signed off.  Outpatient follow-up with GI.  Iatrogenic pneumothorax -Resolved.  Status post removal of chest tube on 08/09/2018.  PCCM has signed off  End-stage renal disease on hemodialysis/Hypotension -Nephrology consulted and  appreciated -Currently in dialysis -Blood pressure on the lower side but stable.  Continue midodrine.  Recurrent pleural effusions -Status post left thoracentesis with subsequent pneumothorax and chest tube placement on 08/05/2018.  Continue volume removal as per HD.  Defer further thoracentesis unless patient is in severe respiratory distress  Malnutrition/Hypoalbuminemia -Patient continues to refuse to eat.  Patient has pulled out her NG tube multiple times. Has been noted to have hypoglycemia -NG tube has been placed back again and feeding has been restarted. -Patient/family not interested in pursuing PEG tube feeding.   -Previous hospitalist had an extensive discussion with [name]/daughter on the phone again on 08/15/2018 and she states that her mother does not like the hospital food and would not want to have PEG tube as she might pull it out as well.  Putting in NG tube every other day since the patient keeps pulling the NG tube out, is also not a solution.  Since patient is full code and not interested in hospice and does not want to go to SNF, encouraged [name] to visit her mom, talk to her mom and may be encourage her to eat more.  Mother thinks that her mom would eat better at home. -Pending further decisions from daughter regarding mother's care.  -Likely to discharge patient home with home health on 08/18/2018 -Calorie count ordered  Generalized deconditioning/History of stroke/severe debility -Aspirin and Plavix on hold. -Overall prognosis is guarded to poor.   -Palliative care consulted and appreciated.  Patient remains full code at this time.  Patient does not want a PEG tube. -Consider hospice/comfort measures.  Currently patient/family not interested.  CAD -Stable.  Aspirin/Plavix on hold  Diabetes mellitus type II -Blood sugars intermittently on the lower side as she has poor oral intake  Acute hypoxic respiratory failure/COPD -Was briefly intubated for  procedure. -Resolved and currently on room air. -Continue PRN bronchodilators and pulmonary toiletry. -X-ray status stable currently.  Chronic diastolic CHF -Volume managed by dialysis.  Hyperkalemia -manage with hemodialysis  DVT Prophylaxis  SCDs  Code Status: Full  Family Communication: None at bedside.  Disposition Plan: Admitted. Pending family decisions. Possibly discharge to home on 6/25 with home health   Consultants Gastroenterology PCCM Interventional radiology Nephrology Palliative care  Procedures  EGD x2 Colonoscopy Left thoracentesis Left chest tube placement on 08/05/2018, removal 08/09/2018 Left IJ venous catheter placement 08/07/2018  Antibiotics   Anti-infectives (From admission, onward)   None      Subjective:   Chanique Duca seen and examined today.  Not very talkative today.  Seen in hemodialysis.  Denies any current pain.  Objective:   Vitals:   08/17/18 0930 08/17/18 1000 08/17/18 1025 08/17/18 1115  BP: (!) 80/40 (!) 71/37 (!) 94/46 (!) 110/39  Pulse: 62 62 (!) 57 (!) 57  Resp:   18 16  Temp:   98 F (36.7 C) (!) 97.5 F (36.4 C)  TempSrc:   Oral Oral  SpO2:   98%   Weight:   31.7 kg   Height:        Intake/Output Summary (Last 24 hours) at 08/17/2018 1449 Last data filed at 08/17/2018 1346 Gross per 24 hour  Intake 230 ml  Output 1134 ml  Net -904 ml   Filed Weights   08/17/18 0419 08/17/18 0725 08/17/18 1025  Weight: 33.6 kg 33.3 kg 31.7 kg    Exam  General: Well developed, chronically ill-appearing, cachectic  HEENT: NCAT, mucous membranes moist.   Cardiovascular: S1 S2 auscultated, RRR  Respiratory: Diminished breath sounds, no wheezing  Abdomen: Soft, nontender, nondistended, + bowel sounds  Extremities: warm dry without cyanosis clubbing or edema  Neuro: AAOx3, nonfocal   Data Reviewed: I have personally reviewed following labs and imaging studies  CBC: Recent Labs  Lab 08/11/18 0430  08/12/18 0401  08/12/18 1103 08/12/18 2155 08/15/18 0334 08/16/18 0631 08/17/18 0747  WBC 11.3*  --  10.0  --   --  9.1 6.9 7.0  NEUTROABS 10.3*  --   --   --   --   --   --   --   HGB 11.0*   < > 10.7* 11.8* 11.2* 10.7* 10.0* 10.1*  HCT 33.8*   < > 32.9* 36.5 35.0* 34.2* 32.8* 33.3*  MCV 91.8  --  92.7  --   --  96.1 97.6 97.1  PLT 71*  --  81*  --   --  163 143* 201   < > = values in this interval not displayed.   Basic Metabolic Panel: Recent Labs  Lab 08/11/18 0430 08/12/18 0401 08/13/18 0713 08/14/18 0326 08/15/18 0334 08/16/18 0356 08/17/18 0353  NA 139 136 138 135 138 137 135  K 3.8 4.5 4.2 4.8 4.7 3.9 5.7*  CL 107 106 104 103 103 101 100  CO2 26 23 24 26 28 28 25   GLUCOSE 97 137* 90 156* 92 131* 133*  BUN 7* 18 9 20  29* 14 24*  CREATININE 2.09* 2.80* 1.62* 2.49* 3.47* 2.41* 3.22*  CALCIUM 7.1* 6.6* 6.9* 7.0* 7.6* 7.8* 7.5*  MG 2.0 2.0  --   --   --   --   --   PHOS 2.0* 4.1 2.6 2.1* 2.0* 2.1* 2.3*   GFR: Estimated Creatinine Clearance: 7.3 mL/min (A) (by C-G formula based on SCr of 3.22 mg/dL (H)). Liver Function Tests: Recent Labs  Lab 08/13/18 0713 08/14/18 0326 08/15/18 0334 08/16/18 0356 08/17/18 0353  ALBUMIN 1.6* 1.6* 1.8* 1.7* 1.7*   No results for input(s): LIPASE, AMYLASE in the last 168 hours. No results for input(s): AMMONIA in the last 168 hours. Coagulation Profile: No results for input(s): INR, PROTIME in the last 168 hours. Cardiac Enzymes: No results for input(s): CKTOTAL, CKMB, CKMBINDEX, TROPONINI in the last 168 hours. BNP (last 3 results) No results for input(s): PROBNP in the last 8760 hours. HbA1C: No results for input(s): HGBA1C in the last 72 hours. CBG: Recent Labs  Lab 08/16/18 1621 08/16/18 2026 08/17/18 0031 08/17/18 0418 08/17/18 1123  GLUCAP 98 96 90 133* 75   Lipid Profile: No results for input(s): CHOL, HDL, LDLCALC, TRIG, CHOLHDL, LDLDIRECT in the last 72 hours. Thyroid Function Tests: No results for input(s): TSH,  T4TOTAL, FREET4, T3FREE, THYROIDAB in the last 72 hours. Anemia Panel: No results for input(s): VITAMINB12, FOLATE, FERRITIN, TIBC, IRON, RETICCTPCT in the last 72 hours. Urine analysis:    Component Value Date/Time   COLORURINE YELLOW 07/17/2012 1751   APPEARANCEUR CLOUDY (A) 07/17/2012 1751   LABSPEC 1.020 07/17/2012 1751   PHURINE 5.5 07/17/2012 1751   GLUCOSEU NEGATIVE 07/17/2012 1751   HGBUR SMALL (A) 07/17/2012 1751   BILIRUBINUR NEGATIVE 07/17/2012 1751   KETONESUR  NEGATIVE 07/17/2012 1751   PROTEINUR 30 (A) 07/17/2012 1751   UROBILINOGEN 0.2 07/17/2012 1751   NITRITE NEGATIVE 07/17/2012 1751   LEUKOCYTESUR SMALL (A) 07/17/2012 1751   Sepsis Labs: @LABRCNTIP (procalcitonin:4,lacticidven:4)  )No results found for this or any previous visit (from the past 240 hour(s)).    Radiology Studies: No results found.   Scheduled Meds:  calcitRIOL  0.25 mcg Oral Daily   Chlorhexidine Gluconate Cloth  6 each Topical Q0600   cholecalciferol  2,000 Units Oral Daily   darbepoetin (ARANESP) injection - DIALYSIS  200 mcg Intravenous Q Fri-HD   escitalopram  5 mg Oral Daily   feeding supplement (OSMOLITE 1.5 CAL)  1,000 mL Per Tube Q24H   feeding supplement (PRO-STAT SUGAR FREE 64)  30 mL Oral TID WC   midodrine  10 mg Oral TID WC   multivitamin  1 tablet Oral QHS   Pancrealipase (Lip-Prot-Amyl) CPEP 6,000 units  1 capsule Oral TID WC   pantoprazole  40 mg Oral BID   sodium chloride flush  10-40 mL Intracatheter Q12H   Continuous Infusions:  sodium chloride     sodium chloride       LOS: 18 days   Time Spent in minutes   30 minutes  Gianno Volner D.O. on 08/17/2018 at 2:49 PM  Between 7am to 7pm - Please see pager noted on [email or web address]  After 7pm go to [email or web address]  And look for the night coverage person covering for me after hours  [facility] Office  [phone]

## 2018-08-17 NOTE — Progress Notes (Signed)
PT Cancellation Note  Patient Details Name: Courtney [name] MRN: [id number] DOB: Jun 25, 1940   Cancelled Treatment:      Attempted to see patient in the morning but was at HD. Attempted again in the afternoon but Dystolic B/P was <11. Therapy will follow up on 6/25.     Carney Living 08/17/2018, 3:40 PM

## 2018-08-18 LAB — GLUCOSE, CAPILLARY
Glucose-Capillary: 103 mg/dL — ABNORMAL HIGH (ref 70–99)
Glucose-Capillary: 129 mg/dL — ABNORMAL HIGH (ref 70–99)
Glucose-Capillary: 74 mg/dL (ref 70–99)
Glucose-Capillary: 81 mg/dL (ref 70–99)
Glucose-Capillary: 84 mg/dL (ref 70–99)
Glucose-Capillary: 87 mg/dL (ref 70–99)

## 2018-08-18 LAB — RENAL FUNCTION PANEL
Albumin: 1.7 g/dL — ABNORMAL LOW (ref 3.5–5.0)
Anion gap: 7 (ref 5–15)
BUN: 19 mg/dL (ref 8–23)
CO2: 28 mmol/L (ref 22–32)
Calcium: 7.5 mg/dL — ABNORMAL LOW (ref 8.9–10.3)
Chloride: 102 mmol/L (ref 98–111)
Creatinine, Ser: 2.44 mg/dL — ABNORMAL HIGH (ref 0.44–1.00)
GFR calc Af Amer: 21 mL/min — ABNORMAL LOW (ref >60)
GFR calc non Af Amer: 18 mL/min — ABNORMAL LOW (ref >60)
Glucose, Bld: 115 mg/dL — ABNORMAL HIGH (ref 70–99)
Phosphorus: 2 mg/dL — ABNORMAL LOW (ref 2.5–4.6)
Potassium: 4.5 mmol/L (ref 3.5–5.1)
Sodium: 137 mmol/L (ref 135–145)

## 2018-08-18 LAB — CBC
HCT: 33.7 % — ABNORMAL LOW (ref 36.0–46.0)
Hemoglobin: 10.4 g/dL — ABNORMAL LOW (ref 12.0–15.0)
MCH: 29.9 pg (ref 26.0–34.0)
MCHC: 30.9 g/dL (ref 30.0–36.0)
MCV: 96.8 fL (ref 80.0–100.0)
Platelets: 168 10*3/uL (ref 150–400)
RBC: 3.48 MIL/uL — ABNORMAL LOW (ref 3.87–5.11)
RDW: 17.3 % — ABNORMAL HIGH (ref 11.5–15.5)
WBC: 6.1 10*3/uL (ref 4.0–10.5)
nRBC: 0 % (ref 0.0–0.2)

## 2018-08-18 MED ORDER — SODIUM ZIRCONIUM CYCLOSILICATE 10 G PO PACK
10.0000 g | PACK | Freq: Once | ORAL | Status: AC
  Administered 2018-08-18: 16:00:00 10 g via ORAL
  Filled 2018-08-18: qty 1

## 2018-08-18 NOTE — Progress Notes (Signed)
[facility]    NEPHROLOGY PROGRESS NOTE  SUBJECTIVE: Patient not very talkative, without complaint.  Plan is for patient to go home with daughter tomorrow.  She still has her spot at [facility]- they are expecting her on Saturday   OBJECTIVE:  Vitals:   08/18/18 0819 08/18/18 1216  BP: (!) 104/46 (!) 113/45  Pulse: (!) 58 65  Resp: 14 20  Temp: 97.8 F (36.6 C) 97.8 F (36.6 C)  SpO2: 100% 100%    Intake/Output Summary (Last 24 hours) at 08/18/2018 1527 Last data filed at 08/18/2018 1302 Gross per 24 hour  Intake 330 ml  Output -  Net 330 ml      General: Resting, NAD- very weak- barely arousable  HEENT: MMM  AT anicteric sclera Neck:  No JVD, no adenopathy CV:  Heart RRR  Lungs: Lung sounds with decreased breath sounds bilaterally.  Oxygen saturation 99% on room air Abd:  abd SNT/ND with normal BS GU:  Bladder non-palpable Extremities:  Pitting edema to dep areas- right upper AVG- patent Skin:  No skin rash  MEDICATIONS:  . calcitRIOL  0.25 mcg Oral Daily  . cholecalciferol  2,000 Units Oral Daily  . darbepoetin (ARANESP) injection - DIALYSIS  200 mcg Intravenous Q Fri-HD  . escitalopram  5 mg Oral Daily  . feeding supplement (OSMOLITE 1.5 CAL)  1,000 mL Per Tube Q24H  . feeding supplement (PRO-STAT SUGAR FREE 64)  30 mL Oral TID WC  . midodrine  10 mg Oral TID WC  . multivitamin  1 tablet Oral QHS  . Pancrealipase (Lip-Prot-Amyl) CPEP 6,000 units  1 capsule Oral TID WC  . pantoprazole  40 mg Oral BID       LABS:   CBC Latest Ref Rng & Units 08/18/2018 08/17/2018 08/16/2018  WBC 4.0 - 10.5 K/uL 6.1 7.0 6.9  Hemoglobin 12.0 - 15.0 g/dL 10.4(L) 10.1(L) 10.0(L)  Hematocrit 36.0 - 46.0 % 33.7(L) 33.3(L) 32.8(L)  Platelets 150 - 400 K/uL 168 201 143(L)    CMP Latest Ref Rng & Units 08/18/2018 08/17/2018 08/16/2018  Glucose 70 - 99 mg/dL 115(H) 133(H) 131(H)  BUN 8 - 23 mg/dL 19 24(H) 14  Creatinine 0.44 - 1.00 mg/dL 2.44(H) 3.22(H) 2.41(H)   Sodium 135 - 145 mmol/L 137 135 137  Potassium 3.5 - 5.1 mmol/L 4.5 5.7(H) 3.9  Chloride 98 - 111 mmol/L 102 100 101  CO2 22 - 32 mmol/L 28 25 28   Calcium 8.9 - 10.3 mg/dL 7.5(L) 7.5(L) 7.8(L)  Total Protein 6.5 - 8.1 g/dL - - -  Total Bilirubin 0.3 - 1.2 mg/dL - - -  Alkaline Phos 38 - 126 U/L - - -  AST 15 - 41 U/L - - -  ALT 0 - 44 U/L - - -    Lab Results  Component Value Date   PTH 70 (H) 08/08/2018   PTH Comment 08/08/2018   CALCIUM 7.5 (L) 08/18/2018   CAION 1.05 (L) 08/03/2018   PHOS 2.0 (L) 08/18/2018       Component Value Date/Time   COLORURINE YELLOW 07/17/2012 1751   APPEARANCEUR CLOUDY (A) 07/17/2012 1751   LABSPEC 1.020 07/17/2012 1751   PHURINE 5.5 07/17/2012 1751   GLUCOSEU NEGATIVE 07/17/2012 1751   HGBUR SMALL (A) 07/17/2012 1751   BILIRUBINUR NEGATIVE 07/17/2012 1751   KETONESUR NEGATIVE 07/17/2012 1751   PROTEINUR 30 (A) 07/17/2012 1751   UROBILINOGEN 0.2 07/17/2012 1751   NITRITE NEGATIVE 07/17/2012 1751   LEUKOCYTESUR SMALL (A)  07/17/2012 1751      Component Value Date/Time   PHART 7.396 08/04/2018 1345   PCO2ART 33.6 08/04/2018 1345   PO2ART 182 (H) 08/04/2018 1345   HCO3 20.2 08/04/2018 1345   TCO2 23 08/03/2018 2248   ACIDBASEDEF 3.8 (H) 08/04/2018 1345   O2SAT 99.4 08/04/2018 1345       Component Value Date/Time   IRON 111 07/31/2018 0336   TIBC 136 (L) 07/31/2018 0336   FERRITIN 1,324 (H) 07/31/2018 0336   IRONPCTSAT 82 (H) 07/31/2018 0336       ASSESSMENT/PLAN:    78 year old female patient with chronic diastolic heart failure, coronary artery disease, type 2 diabetes, hypertension, hyperlipidemia, stroke and end-stage renal disease on hemodialysis Monday, Wednesday, and Friday who presented on 07/30/2018 with altered mental status and anemia with a hemoglobin of 5.7.  An EGD revealed an AVM.  She also underwent a colonoscopy and repeat EGD.  1.  End-stage renal disease on hemodialysis.  Have been doing dialysis on Monday, Wednesday,  and Friday via [facility] here so far, last tx on Wed.  attempt to challenge dry weight- difficult due to low BP.  Pre HD k 5.7- on 2 K bath.  Normally TTS at [facility] - they are expecting her on Sat so she can just get HD 3 days, Mon, Wed and Sat this week.  So next HD will be as OP on Saturday- clinic aware.  I will give her a dose of lokelma today just to assure K will be OK to wait til Saturday  2.  Acute blood loss anemia.  ON PPI per GI.  Hemoglobin trending down but is 10.4 today  - aranesp 200 q Friday- iron stores were OK early in hosp   3.  Secondary hyperparathyroidism.  PTH was 70.  Phosphorus stable at 2.3- no binder.  Will continue calcitriol given markedly low calcium level.   added a vitamin D supplementation for market vitamin D deficiency.  4. HTN/vol-  Seems overloaded UF as able- BP lowish on chronic midodrine   5. Pneumothorax-- s/p chest tube, removed and stable  6. Malnutrition- albumin in the 1's- not sure if there is plan for feeding tube- daughter says not not right now   Encourage PO's.  Significant FTT  7. Dispo-  Making sure daughter has supplies that she needs to care for her and that home therapies in place -  plan I am told is for discharge tomorrow- as above patient will go next to her OP unit in [place] for HD on Saturday and does not need any more HD here at [facility]

## 2018-08-18 NOTE — Progress Notes (Signed)
Physical Therapy Treatment Patient Details Name: Courtney [name] MRN: [id number] DOB: 1941-02-15 Today's Date: 08/18/2018    History of Present Illness Pt is a 78 yr old female with history of end stage end stage renal disease. Pt has had extensive stay with Acute on chronic blood loss anemia and has had 7 units of RBC.  PMH: CHF, CAD, stroke (2-3 yrs) MI, HTN, Hypertention, DM     PT Comments    Patient seen for mobility progression. Pt is lethargic but more alert when sitting up EOB. Pt requires total A--total A +2 for all mobility this session including bed mobility and partial stand EOB. Pt with R LE flexion contracture and question if pt was standing upright prior to admission. Supine and seated BPs taken during session-see general comments below. Continue to recommend SNF however if family declines pt will need the equipment listed below.   Follow Up Recommendations  SNF     Equipment Recommendations  Hospital bed;Other (comment);Wheelchair (measurements PT);Wheelchair cushion (measurements PT)(air mattress overlay and hoyer lift )   May already have w/c given need for transporting to HD?   Recommendations for Other Services       Precautions / Restrictions Precautions Precautions: Fall Precaution Comments: R sided weakness, NGT for feeding, sacral and ischial wounds Restrictions Weight Bearing Restrictions: No    Mobility  Bed Mobility Overal bed mobility: Needs Assistance Bed Mobility: Rolling;Sidelying to Sit;Sit to Supine Rolling: +2 for physical assistance;Total assist Sidelying to sit: +2 for physical assistance;Total assist   Sit to supine: Total assist;+2 for physical assistance   General bed mobility comments: assist for all aspects of bed mobility; cues for sequencing and assistance to reach toward R bed rail with L UE; use of bed pad to bring hips toward EOB  Transfers Overall transfer level: Needs assistance Equipment used: (+2 face to face with use of gait  belt and bed pad ) Transfers: Sit to/from Stand Sit to Stand: Total assist;+2 physical assistance         General transfer comment: +2 assist for attempting sit to stand however pt with heavy R lateral bias and unable to assist with standing; use of bed pad for hip extension; unable to get into full standing   Ambulation/Gait             General Gait Details: per most recent Palliative Medicine note, pt's daughter reports pt is non ambulatory at baseline    Stairs             Wheelchair Mobility    Modified Rankin (Stroke Patients Only)       Balance Overall balance assessment: Needs assistance Sitting-balance support: Feet supported;Single extremity supported Sitting balance-Leahy Scale: Zero   Postural control: Right lateral lean;Posterior lean   Standing balance-Leahy Scale: Zero Standing balance comment: total A +2 for partial stand                            Cognition Arousal/Alertness: Lethargic(more alert when sitting upright ) Behavior During Therapy: Flat affect Overall Cognitive Status: No family/caregiver present to determine baseline cognitive functioning Area of Impairment: Orientation;Attention;Safety/judgement;Following commands;Memory;Awareness;Problem solving                 Orientation Level: Disoriented to;Place;Time;Situation Current Attention Level: Sustained Memory: Decreased recall of precautions;Decreased short-term memory Following Commands: Follows one step commands with increased time;Follows one step commands inconsistently Safety/Judgement: Decreased awareness of safety;Decreased awareness of deficits Awareness: Intellectual Problem  Solving: Slow processing;Decreased initiation;Difficulty sequencing;Requires verbal cues;Requires tactile cues        Exercises      General Comments General comments (skin integrity, edema, etc.): R LE flexion contracture; BP in supine 121/49( 71), in sitting 111/50 (69), in  supine after mobility 118/48 (67)      Pertinent Vitals/Pain Pain Assessment: Faces Faces Pain Scale: Hurts even more Pain Location: "everywhere"  Pain Descriptors / Indicators: Grimacing;Guarding;Moaning Pain Intervention(s): Limited activity within patient's tolerance;Monitored during session;Repositioned    Home Living                      Prior Function            PT Goals (current goals can now be found in the care plan section) Progress towards PT goals: Not progressing toward goals - comment    Frequency    Min 2X/week      PT Plan Current plan remains appropriate    Co-evaluation PT/OT/SLP Co-Evaluation/Treatment: Yes            AM-PAC PT "6 Clicks" Mobility   Outcome Measure  Help needed turning from your back to your side while in a flat bed without using bedrails?: Total Help needed moving from lying on your back to sitting on the side of a flat bed without using bedrails?: Total Help needed moving to and from a bed to a chair (including a wheelchair)?: Total Help needed standing up from a chair using your arms (e.g., wheelchair or bedside chair)?: Total Help needed to walk in hospital room?: Total Help needed climbing 3-5 steps with a railing? : Total 6 Click Score: 6    End of Session Equipment Utilized During Treatment: Gait belt Activity Tolerance: Patient limited by fatigue;Patient limited by pain Patient left: with call bell/phone within reach;in bed;with bed alarm set Nurse Communication: Mobility status;Other (comment)(needs assistance to eat lunch) PT Visit Diagnosis: Muscle weakness (generalized) (M62.81);Difficulty in walking, not elsewhere classified (R26.2);Pain Pain - Right/Left: (generalized) Pain - part of body: (generalized)     Time: 8280-0349 PT Time Calculation (min) (ACUTE ONLY): 24 min  Charges:  $Therapeutic Activity: 8-22 mins                     Earney Navy, PTA Acute Rehabilitation Services Pager: [phone] Office: [phone]     Darliss Cheney 08/18/2018, 12:04 PM

## 2018-08-18 NOTE — Progress Notes (Signed)
Patient's daughter [name] was called and updated on her mom. All the questions were answered. She was encouraged to call back if she will have more questions.

## 2018-08-18 NOTE — Progress Notes (Signed)
PROGRESS NOTE    Courtney [name]  OVZ:[id number] DOB: 1940-11-24 DOA: 07/30/2018 PCP: Neale Burly, MD   Brief Narrative:  HPI on 07/30/2018 by Dr. Creed Copper is a 78 y.o. female with medical history significant of ESRD ON HD, TThsat, diastolic heart failure, stroke, CAD , DM, hypertension, hyperlipidemia presents to ED from HD for hypotension. Minimal history from the patient, her son at bedside and reports she has been lethargic, slightly confused and weak since yesterday. No bloody stools at home.  No fevers or chills, no sob or chest pain. Pt denies any complaints. She would answer simple questions and go to sleep. Son reports she never got colonoscopy in the past , as she was high risk for anesthetic complications.   Interim history Admitted for AMS, anemia. She was transfused 2 units packed red cells.  CT of the abdomen and pelvis showed small amount of gas in the endometrial canal of uncertain etiology with bilateral pleural effusions.  This was discussed with GYN on-call who did not recommend any further work-up.  Patient has had a long hospitalization.  Initially there was no plans for any endoscopic procedures but patient continued to have intermittent blood in stools.  She required more packed red cell transfusion.  Patient was transferred to ICU on 08/03/2018.  Tagged red cell study was positive with concern for proximal bleeding source such as duodenum or stomach.  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  Patient also underwent left-sided thoracentesis on 08/05/2018 for left pleural effusion and subsequently developed pneumothorax for which chest tube was placed.  Patient again had capsule bleeding on 08/06/2018 and hemoglobin had dropped to 6.7 from 7.7.  She was subsequently transfused packed red cells again.  She underwent colonoscopy on 08/07/2018 which showed old blood in the entire colon, no active bleeding  noted on lavage, few polyps noted but not removed and terminal ileum showed old blood as well.  She had repeat EGD on 08/07/2018 which showed small amount of blood in fundus, duodenal ulcer and visible vessel without active bleeding for which one Endo Clip was placed.  Patient was continued on Protonix drip.  Small bowel PillCam was placed.  Chest tube was removed on 08/09/2018. subsequently, her oral intake has been very poor.  NG tube feeding has been started.  She was transferred back to [facility] service on 08/12/2018. Assessment & Plan   Acute on chronic blood loss anemia/ Upper GI bleeding  -most likely duodenal bleed -Patient presented with hemoglobin of 5.7.  Has had 7 units of packed red cells transfusion since admission, last transfusion was on 08/10/2018. -Hemoglobin 10.1 today.  No overnight black or bloody stools -Gastroenterology consulted and appreciated -Tagged red cell study was positive with concern for proximal bleeding source such as duodenum or stomach. -Status post EGD on 08/03/2018 which showed duodenal AVM which was injected and treated with gold probe.   -Status post  colonoscopy on 08/07/2018 which showed old blood in the entire colon, no active bleeding noted on lavage, few polyps noted but not removed and terminal ileum showed old blood as well.  She had repeat EGD on 08/07/2018 which showed small amount of blood in fundus, duodenal ulcer and visible vessel without active bleeding for which one Endo Clip was placed. -Continue twice a day Protonix.  On 08/11/2018, GI recommends PPI twice a day for at least 4 weeks thereafter once a day  indefinitely.  Aspirin and Plavix on hold.  If absolutely needed, GI recommends aspirin 81 mg only, possibly after a week.  GI has signed off.  Outpatient follow-up with GI.  Iatrogenic pneumothorax -Resolved.  Status post removal of chest tube on 08/09/2018.  PCCM has signed off  End-stage renal disease on hemodialysis/Hypotension -Nephrology consulted  and appreciated -Patient has outpatient slot with [facility] [place] [facility] 11:15am -Blood pressure on the lower side but stable.  Continue midodrine.  Recurrent pleural effusions -Status post left thoracentesis with subsequent pneumothorax and chest tube placement on 08/05/2018.  Continue volume removal as per HD.  Defer further thoracentesis unless patient is in severe respiratory distress  Malnutrition/Hypoalbuminemia -Patient continues to refuse to eat.  Patient has pulled out her NG tube multiple times. Has been noted to have hypoglycemia -NG tube has been placed back again and feeding has been restarted. -Patient/family not interested in pursuing PEG tube feeding.   -Previous hospitalist had an extensive discussion with Courtney [name]/daughter on the phone again on 08/15/2018 and she states that her mother does not like the hospital food and would not want to have PEG tube as she might pull it out as well.  Putting in NG tube every other day since the patient keeps pulling the NG tube out, is also not a solution.  Since patient is full code and not interested in hospice and does not want to go to SNF, encouraged Courtney [name] to visit her mom, talk to her mom and may be encourage her to eat more.  Mother thinks that her mom would eat better at home. -Pending further decisions from daughter regarding mother's care.  -Likely to discharge patient home with home health on 08/19/2018 -Calorie count ordered  Generalized deconditioning/History of stroke/severe debility -Aspirin and Plavix on hold- see discussion above -Overall prognosis is guarded to poor.   -Palliative care consulted and appreciated.  Patient remains full code at this time.  Patient does not want a PEG tube. -Consider hospice/comfort measures.  Currently patient/family not interested.  CAD -Stable.  Aspirin/Plavix on hold  Diabetes mellitus type II -Blood sugars intermittently on the lower side as she has poor oral intake  Acute hypoxic  respiratory failure/COPD -Was briefly intubated for procedure. -Resolved and currently on room air. -Continue PRN bronchodilators and pulmonary toiletry. -X-ray status stable currently.  Chronic diastolic CHF -Volume managed by dialysis.  Hyperkalemia -manage with hemodialysis  DVT Prophylaxis  SCDs  Code Status: Full  Family Communication: None at bedside.  Disposition Plan: Admitted. Pending family decisions. Possibly discharge to home on 6/26 with home health   Consultants Gastroenterology PCCM Interventional radiology Nephrology Palliative care  Procedures  EGD x2 Colonoscopy Left thoracentesis Left chest tube placement on 08/05/2018, removal 08/09/2018 Left IJ venous catheter placement 08/07/2018  Antibiotics   Anti-infectives (From admission, onward)   None      Subjective:   Courtney [name] seen and examined today.  Not very talkative today.  Has no complaints this morning. Denies any current pain.  Objective:   Vitals:   08/18/18 0100 08/18/18 0336 08/18/18 0453 08/18/18 0819  BP:  (!) 108/48  (!) 104/46  Pulse: 62 62  (!) 58  Resp: 14 13  14   Temp:   98.7 F (37.1 C) 97.8 F (36.6 C)  TempSrc:   Oral Oral  SpO2: 100% 100%  100%  Weight:      Height:        Intake/Output Summary (Last 24 hours) at 08/18/2018 1208 Last data filed  at 08/18/2018 0918 Gross per 24 hour  Intake 300 ml  Output --  Net 300 ml   Filed Weights   08/17/18 0419 08/17/18 0725 08/17/18 1025  Weight: 33.6 kg 33.3 kg 31.7 kg   Exam  General: Well developed, chronically ill appearing, cachetic  HEENT: NCAT, mucous membranes moist.   Neck: Supple  Cardiovascular: S1 S2 auscultated, RRR  Respiratory: Diminished breath sound, no wheezing  Abdomen: Soft, nontender, nondistended, + bowel sounds  Extremities: warm dry without cyanosis clubbing or edema  Neuro: AAOx3, nonfocal  Data Reviewed: I have personally reviewed following labs and imaging studies  CBC: Recent  Labs  Lab 08/12/18 0401  08/12/18 2155 08/15/18 0334 08/16/18 0631 08/17/18 0747 08/18/18 0314  WBC 10.0  --   --  9.1 6.9 7.0 6.1  HGB 10.7*   < > 11.2* 10.7* 10.0* 10.1* 10.4*  HCT 32.9*   < > 35.0* 34.2* 32.8* 33.3* 33.7*  MCV 92.7  --   --  96.1 97.6 97.1 96.8  PLT 81*  --   --  163 143* 201 168   < > = values in this interval not displayed.   Basic Metabolic Panel: Recent Labs  Lab 08/12/18 0401  08/14/18 0326 08/15/18 0334 08/16/18 0356 08/17/18 0353 08/18/18 0314  NA 136   < > 135 138 137 135 137  K 4.5   < > 4.8 4.7 3.9 5.7* 4.5  CL 106   < > 103 103 101 100 102  CO2 23   < > 26 28 28 25 28   GLUCOSE 137*   < > 156* 92 131* 133* 115*  BUN 18   < > 20 29* 14 24* 19  CREATININE 2.80*   < > 2.49* 3.47* 2.41* 3.22* 2.44*  CALCIUM 6.6*   < > 7.0* 7.6* 7.8* 7.5* 7.5*  MG 2.0  --   --   --   --   --   --   PHOS 4.1   < > 2.1* 2.0* 2.1* 2.3* 2.0*   < > = values in this interval not displayed.   GFR: Estimated Creatinine Clearance: 9.7 mL/min (A) (by C-G formula based on SCr of 2.44 mg/dL (H)). Liver Function Tests: Recent Labs  Lab 08/14/18 0326 08/15/18 0334 08/16/18 0356 08/17/18 0353 08/18/18 0314  ALBUMIN 1.6* 1.8* 1.7* 1.7* 1.7*   No results for input(s): LIPASE, AMYLASE in the last 168 hours. No results for input(s): AMMONIA in the last 168 hours. Coagulation Profile: No results for input(s): INR, PROTIME in the last 168 hours. Cardiac Enzymes: No results for input(s): CKTOTAL, CKMB, CKMBINDEX, TROPONINI in the last 168 hours. BNP (last 3 results) No results for input(s): PROBNP in the last 8760 hours. HbA1C: No results for input(s): HGBA1C in the last 72 hours. CBG: Recent Labs  Lab 08/17/18 1657 08/17/18 2021 08/18/18 0024 08/18/18 0455 08/18/18 0752  GLUCAP 107* 98 74 103* 129*   Lipid Profile: No results for input(s): CHOL, HDL, LDLCALC, TRIG, CHOLHDL, LDLDIRECT in the last 72 hours. Thyroid Function Tests: No results for input(s): TSH,  T4TOTAL, FREET4, T3FREE, THYROIDAB in the last 72 hours. Anemia Panel: No results for input(s): VITAMINB12, FOLATE, FERRITIN, TIBC, IRON, RETICCTPCT in the last 72 hours. Urine analysis:    Component Value Date/Time   COLORURINE YELLOW 07/17/2012 1751   APPEARANCEUR CLOUDY (A) 07/17/2012 1751   LABSPEC 1.020 07/17/2012 1751   PHURINE 5.5 07/17/2012 1751   GLUCOSEU NEGATIVE 07/17/2012 1751   HGBUR SMALL (A)  07/17/2012 [place] 07/17/2012 1751   KETONESUR NEGATIVE 07/17/2012 1751   PROTEINUR 30 (A) 07/17/2012 1751   UROBILINOGEN 0.2 07/17/2012 1751   NITRITE NEGATIVE 07/17/2012 1751   LEUKOCYTESUR SMALL (A) 07/17/2012 1751   Sepsis Labs: @LABRCNTIP (procalcitonin:4,lacticidven:4)  )No results found for this or any previous visit (from the past 240 hour(s)).    Radiology Studies: No results found.   Scheduled Meds:  calcitRIOL  0.25 mcg Oral Daily   Chlorhexidine Gluconate Cloth  6 each Topical Q0600   cholecalciferol  2,000 Units Oral Daily   darbepoetin (ARANESP) injection - DIALYSIS  200 mcg Intravenous Q Fri-HD   escitalopram  5 mg Oral Daily   feeding supplement (OSMOLITE 1.5 CAL)  1,000 mL Per Tube Q24H   feeding supplement (PRO-STAT SUGAR FREE 64)  30 mL Oral TID WC   midodrine  10 mg Oral TID WC   multivitamin  1 tablet Oral QHS   Pancrealipase (Lip-Prot-Amyl) CPEP 6,000 units  1 capsule Oral TID WC   pantoprazole  40 mg Oral BID   Continuous Infusions:  sodium chloride     sodium chloride       LOS: 19 days   Time Spent in minutes   30 minutes  Lenoria Narine D.O. on 08/18/2018 at 12:08 PM  Between 7am to 7pm - Please see pager noted on [email or web address]  After 7pm go to [email or web address]  And look for the night coverage person covering for me after hours  [facility] Office  [phone]

## 2018-08-18 NOTE — Progress Notes (Signed)
Tube feeding was stopped at 8 AM per order. Will continue to monitor.

## 2018-08-18 NOTE — Progress Notes (Addendum)
Pt will d/c to daughter's home when d/c.  Daughter's home address: [address], [place], [place] [address].  [name] Cariker (Daughter)     [phone]      Whitman Hero RN,BSN,CM

## 2018-08-18 NOTE — Care Management Important Message (Signed)
Important Message  Patient Details  Name: Courtney [name] MRN: [id number] Date of Birth: [birth year]   Medicare Important Message Given:  Yes     Orbie Pyo 08/18/2018, 4:22 PM

## 2018-08-18 NOTE — Progress Notes (Signed)
Occupational Therapy Treatment Patient Details Name: Courtney [name] MRN: [id number] DOB: 1940/04/12 Today's Date: 08/18/2018    History of present illness Pt is a 78 yr old female with history of end stage end stage renal disease. Pt has had extensive stay with Acute on chronic blood loss anemia and has had 7 units of RBC.  PMH: CHF, CAD, stroke (2-3 yrs) MI, HTN, Hypertention, DM    OT comments  Pt continues to be very lethargic but becomes slightly more alert with bed mobility and sitting EOB. She initiates minimal movement for bed mobility (begins to reach for bed rail with left UE) but requires +2 total assist to transition from supine to sit.  Continue to recommend SNF. Per chart review (pallative notes), family is declining SNF. If pt does not go SNF, she will need HHOT and the following equipment recommendations. Will continue to follow acutely.  Follow Up Recommendations  Home health OT;SNF;Supervision/Assistance - 24 hour    Equipment Recommendations  3 in 1 bedside commode;Hospital bed;Wheelchair cushion (measurements OT);Wheelchair (measurements OT)(hoyer lift)    Recommendations for Other Services      Precautions / Restrictions Precautions Precautions: Fall Precaution Comments: R sided weakness, NGT for feeding, sacral and ischial wounds Restrictions Weight Bearing Restrictions: No       Mobility Bed Mobility Overal bed mobility: Needs Assistance Bed Mobility: Rolling;Sidelying to Sit;Sit to Supine Rolling: +2 for physical assistance;Total assist Sidelying to sit: +2 for physical assistance;Total assist   Sit to supine: Total assist;+2 for physical assistance   General bed mobility comments: assist for all aspects of bed mobility; cues for sequencing and assistance to reach toward R bed rail with L UE; use of bed pad to bring hips toward EOB  Transfers Overall transfer level: Needs assistance Equipment used: (+2 face to face with use of gait belt) Transfers: Sit  to/from Stand Sit to Stand: Total assist;+2 physical assistance         General transfer comment: +2 assist for attempting sit to stand however pt with heavy R lateral bias and unable to assist with standing; use of bed pad for hip extension; unable to get into full standing     Balance Overall balance assessment: Needs assistance Sitting-balance support: Feet supported;Single extremity supported Sitting balance-Leahy Scale: Zero Sitting balance - Comments: Max assist EOB due to R lateral lean, pt is able to elicit some trunkal correction with cues, but fatigues quickly requiring max assist EOB to maintain upright, midline posture.   Postural control: Right lateral lean;Posterior lean Standing balance support: Single extremity supported Standing balance-Leahy Scale: Zero Standing balance comment: total A +2 for partial stand                           ADL either performed or assessed with clinical judgement   ADL Overall ADL's : Needs assistance/impaired                     Lower Body Dressing: Total assistance;Bed level                 General ADL Comments: +2 total assist for bed mobility to transition to sitting EOB. Pt sat EOB with total assist for balance for 3-4 minutes. Attempted stand with +2 total assist but unsuccessful (see transfer section).      Vision       Perception     Praxis      Cognition Arousal/Alertness: Lethargic(more alert  when sitting EOB) Behavior During Therapy: Flat affect Overall Cognitive Status: No family/caregiver present to determine baseline cognitive functioning Area of Impairment: Orientation;Attention;Safety/judgement;Following commands;Memory;Awareness;Problem solving                 Orientation Level: Disoriented to;Place;Time;Situation Current Attention Level: Sustained Memory: Decreased recall of precautions;Decreased short-term memory Following Commands: Follows one step commands with increased  time;Follows one step commands inconsistently Safety/Judgement: Decreased awareness of safety;Decreased awareness of deficits Awareness: Intellectual Problem Solving: Slow processing;Decreased initiation;Difficulty sequencing;Requires verbal cues;Requires tactile cues          Exercises     Shoulder Instructions       General Comments R LE flexion contracture; BP in supine 121/49( 71), in sitting 111/50 (69), in supine after mobility 118/48 (67)    Pertinent Vitals/ Pain       Pain Assessment: Faces Faces Pain Scale: Hurts even more Pain Location: "everywhere"  Pain Descriptors / Indicators: Grimacing;Guarding;Moaning Pain Intervention(s): Limited activity within patient's tolerance;Monitored during session;Repositioned  Home Living                                          Prior Functioning/Environment              Frequency  Min 2X/week        Progress Toward Goals  OT Goals(current goals can now be found in the care plan section)  Progress towards OT goals: Progressing toward goals  Acute Rehab OT Goals Patient Stated Goal: unable to state, agreeable she would like to get OOB OT Goal Formulation: With patient Time For Goal Achievement: 08/21/18 Potential to Achieve Goals: Good ADL Goals Pt Will Perform Eating: with min guard assist;sitting Pt Will Perform Upper Body Bathing: with min assist;bed level Pt Will Transfer to Toilet: with +2 assist;with mod assist;bedside commode  Plan Equipment recommendations need to be updated;Discharge plan needs to be updated;Frequency remains appropriate    Co-evaluation    PT/OT/SLP Co-Evaluation/Treatment: Yes Reason for Co-Treatment: Complexity of the patient's impairments (multi-system involvement);For patient/therapist safety;To address functional/ADL transfers   OT goals addressed during session: ADL's and self-care;Other (comment)(bed mobility)      AM-PAC OT "6 Clicks" Daily Activity      Outcome Measure   Help from another person eating meals?: A Lot Help from another person taking care of personal grooming?: Total Help from another person toileting, which includes using toliet, bedpan, or urinal?: Total Help from another person bathing (including washing, rinsing, drying)?: Total Help from another person to put on and taking off regular upper body clothing?: Total Help from another person to put on and taking off regular lower body clothing?: Total 6 Click Score: 7    End of Session Equipment Utilized During Treatment: Gait belt  OT Visit Diagnosis: Unsteadiness on feet (R26.81);Repeated falls (R29.6);Muscle weakness (generalized) (M62.81);Feeding difficulties (R63.3);Pain;Cognitive communication deficit (R41.841)   Activity Tolerance Patient limited by fatigue;Patient limited by lethargy;Patient limited by pain   Patient Left in bed;with bed alarm set   Nurse Communication Mobility status        Time: 6384-5364 OT Time Calculation (min): 26 min  Charges: OT General Charges $OT Visit: 1 Visit OT Treatments $Therapeutic Activity: 8-22 mins     Darrol Jump OTR/L  [place] [phone] 08/18/2018, 1:00 PM

## 2018-08-18 NOTE — Progress Notes (Addendum)
    Durable Medical Equipment  (From admission, onward)         Start     Ordered   08/18/18 1711  For home use only DME Hospital bed  Once    Question Answer Comment  Length of Need Lifetime   The above medical condition requires: Patient requires the ability to reposition frequently   Bed type Semi-electric   Hoyer Lift Yes   Support Surface: Gel Overlay      08/18/18 1710

## 2018-08-19 LAB — CBC
HCT: 33 % — ABNORMAL LOW (ref 36.0–46.0)
Hemoglobin: 10.1 g/dL — ABNORMAL LOW (ref 12.0–15.0)
MCH: 29.2 pg (ref 26.0–34.0)
MCHC: 30.6 g/dL (ref 30.0–36.0)
MCV: 95.4 fL (ref 80.0–100.0)
Platelets: 205 10*3/uL (ref 150–400)
RBC: 3.46 MIL/uL — ABNORMAL LOW (ref 3.87–5.11)
RDW: 17.5 % — ABNORMAL HIGH (ref 11.5–15.5)
WBC: 6.3 10*3/uL (ref 4.0–10.5)
nRBC: 0 % (ref 0.0–0.2)

## 2018-08-19 LAB — RENAL FUNCTION PANEL
Albumin: 1.8 g/dL — ABNORMAL LOW (ref 3.5–5.0)
Anion gap: 9 (ref 5–15)
BUN: 33 mg/dL — ABNORMAL HIGH (ref 8–23)
CO2: 27 mmol/L (ref 22–32)
Calcium: 7.8 mg/dL — ABNORMAL LOW (ref 8.9–10.3)
Chloride: 99 mmol/L (ref 98–111)
Creatinine, Ser: 3.25 mg/dL — ABNORMAL HIGH (ref 0.44–1.00)
GFR calc Af Amer: 15 mL/min — ABNORMAL LOW (ref >60)
GFR calc non Af Amer: 13 mL/min — ABNORMAL LOW (ref >60)
Glucose, Bld: 111 mg/dL — ABNORMAL HIGH (ref 70–99)
Phosphorus: 1.8 mg/dL — ABNORMAL LOW (ref 2.5–4.6)
Potassium: 4.6 mmol/L (ref 3.5–5.1)
Sodium: 135 mmol/L (ref 135–145)

## 2018-08-19 LAB — GLUCOSE, CAPILLARY
Glucose-Capillary: 105 mg/dL — ABNORMAL HIGH (ref 70–99)
Glucose-Capillary: 143 mg/dL — ABNORMAL HIGH (ref 70–99)
Glucose-Capillary: 74 mg/dL (ref 70–99)
Glucose-Capillary: 78 mg/dL (ref 70–99)
Glucose-Capillary: 93 mg/dL (ref 70–99)

## 2018-08-19 MED ORDER — CHLORHEXIDINE GLUCONATE CLOTH 2 % EX PADS: 6.0000 | MEDICATED_PAD | Freq: Every day | CUTANEOUS | Status: DC

## 2018-08-19 MED ORDER — MIDODRINE HCL 10 MG PO TABS: 10.0000 mg | ORAL_TABLET | Freq: Three times a day (TID) | ORAL | 0 refills | Status: AC

## 2018-08-19 MED ORDER — PRO-STAT SUGAR FREE PO LIQD: 30.0000 mL | Freq: Three times a day (TID) | ORAL | 0 refills | Status: AC

## 2018-08-19 MED ORDER — RENA-VITE PO TABS: 1.0000 | ORAL_TABLET | Freq: Every day | ORAL | 0 refills | Status: AC

## 2018-08-19 MED ORDER — PANTOPRAZOLE SODIUM 40 MG PO TBEC: 40.0000 mg | DELAYED_RELEASE_TABLET | Freq: Two times a day (BID) | ORAL | 1 refills | Status: AC

## 2018-08-19 NOTE — Discharge Instructions (Signed)
Anemia  Anemia is a condition in which you do not have enough red blood cells or hemoglobin. Hemoglobin is a substance in red blood cells that carries oxygen. When you do not have enough red blood cells or hemoglobin (are anemic), your body cannot get enough oxygen and your organs may not work properly. As a result, you may feel very tired or have other problems. What are the causes? Common causes of anemia include:  Excessive bleeding. Anemia can be caused by excessive bleeding inside or outside the body, including bleeding from the intestine or from periods in women.  Poor nutrition.  Long-lasting (chronic) kidney, thyroid, and liver disease.  Bone marrow disorders.  Cancer and treatments for cancer.  HIV (human immunodeficiency virus) and AIDS (acquired immunodeficiency syndrome).  Treatments for HIV and AIDS.  Spleen problems.  Blood disorders.  Infections, medicines, and autoimmune disorders that destroy red blood cells. What are the signs or symptoms? Symptoms of this condition include:  Minor weakness.  Dizziness.  Headache.  Feeling heartbeats that are irregular or faster than normal (palpitations).  Shortness of breath, especially with exercise.  Paleness.  Cold sensitivity.  Indigestion.  Nausea.  Difficulty sleeping.  Difficulty concentrating. Symptoms may occur suddenly or develop slowly. If your anemia is mild, you may not have symptoms. How is this diagnosed? This condition is diagnosed based on:  Blood tests.  Your medical history.  A physical exam.  Bone marrow biopsy. Your health care provider may also check your stool (feces) for blood and may do additional testing to look for the cause of your bleeding. You may also have other tests, including:  Imaging tests, such as a CT scan or MRI.  Endoscopy.  Colonoscopy. How is this treated? Treatment for this condition depends on the cause. If you continue to lose a lot of blood, you may  need to be treated at a hospital. Treatment may include:  Taking supplements of iron, vitamin M08, or folic acid.  Taking a hormone medicine (erythropoietin) that can help to stimulate red blood cell growth.  Having a blood transfusion. This may be needed if you lose a lot of blood.  Making changes to your diet.  Having surgery to remove your spleen. Follow these instructions at home:  Take over-the-counter and prescription medicines only as told by your health care provider.  Take supplements only as told by your health care provider.  Follow any diet instructions that you were given.  Keep all follow-up visits as told by your health care provider. This is important. Contact a health care provider if:  You develop new bleeding anywhere in the body. Get help right away if:  You are very weak.  You are short of breath.  You have pain in your abdomen or chest.  You are dizzy or feel faint.  You have trouble concentrating.  You have bloody or black, tarry stools.  You vomit repeatedly or you vomit up blood. Summary  Anemia is a condition in which you do not have enough red blood cells or enough of a substance in your red blood cells that carries oxygen (hemoglobin).  Symptoms may occur suddenly or develop slowly.  If your anemia is mild, you may not have symptoms.  This condition is diagnosed with blood tests as well as a medical history and physical exam. Other tests may be needed.  Treatment for this condition depends on the cause of the anemia. This information is not intended to replace advice given to you by  your health care provider. Make sure you discuss any questions you have with your health care provider. °Document Released: 03/19/2004 Document Revised: 01/22/2017 Document Reviewed: 03/13/2016 °Elsevier Patient Education © 2020 [employer] Inc. ° °

## 2018-08-19 NOTE — Progress Notes (Signed)
[facility]    NEPHROLOGY PROGRESS NOTE  SUBJECTIVE: Patient about the same.  Plan is for patient to go home with daughter today.  She still has her spot at [facility]- they are expecting her on Saturday.  Discharge summary is done   OBJECTIVE:  Vitals:   08/19/18 0804 08/19/18 1203  BP:    Pulse:    Resp:    Temp: 97.9 F (36.6 C) (!) 97.3 F (36.3 C)  SpO2:      Intake/Output Summary (Last 24 hours) at 08/19/2018 1230 Last data filed at 08/19/2018 7412 Gross per 24 hour  Intake 3095.92 ml  Output -  Net 3095.92 ml      General: Resting, NAD- very weak- barely arousable  HEENT: MMM [place] AT anicteric sclera Neck:  No JVD, no adenopathy CV:  Heart RRR  Lungs: Lung sounds with decreased breath sounds bilaterally.  Oxygen saturation 99% on room air Abd:  abd SNT/ND with normal BS GU:  Bladder non-palpable Extremities:  Pitting edema to dep areas- right upper AVG- patent Skin:  No skin rash  MEDICATIONS:  . calcitRIOL  0.25 mcg Oral Daily  . cholecalciferol  2,000 Units Oral Daily  . darbepoetin (ARANESP) injection - DIALYSIS  200 mcg Intravenous Q Fri-HD  . escitalopram  5 mg Oral Daily  . feeding supplement (OSMOLITE 1.5 CAL)  1,000 mL Per Tube Q24H  . feeding supplement (PRO-STAT SUGAR FREE 64)  30 mL Oral TID WC  . midodrine  10 mg Oral TID WC  . multivitamin  1 tablet Oral QHS  . Pancrealipase (Lip-Prot-Amyl) CPEP 6,000 units  1 capsule Oral TID WC  . pantoprazole  40 mg Oral BID       LABS:   CBC Latest Ref Rng & Units 08/19/2018 08/18/2018 08/17/2018  WBC 4.0 - 10.5 K/uL 6.3 6.1 7.0  Hemoglobin 12.0 - 15.0 g/dL 10.1(L) 10.4(L) 10.1(L)  Hematocrit 36.0 - 46.0 % 33.0(L) 33.7(L) 33.3(L)  Platelets 150 - 400 K/uL 205 168 201    CMP Latest Ref Rng & Units 08/19/2018 08/18/2018 08/17/2018  Glucose 70 - 99 mg/dL 111(H) 115(H) 133(H)  BUN 8 - 23 mg/dL 33(H) 19 24(H)  Creatinine 0.44 - 1.00 mg/dL 3.25(H) 2.44(H) 3.22(H)  Sodium 135 - 145 mmol/L  135 137 135  Potassium 3.5 - 5.1 mmol/L 4.6 4.5 5.7(H)  Chloride 98 - 111 mmol/L 99 102 100  CO2 22 - 32 mmol/L 27 28 25   Calcium 8.9 - 10.3 mg/dL 7.8(L) 7.5(L) 7.5(L)  Total Protein 6.5 - 8.1 g/dL - - -  Total Bilirubin 0.3 - 1.2 mg/dL - - -  Alkaline Phos 38 - 126 U/L - - -  AST 15 - 41 U/L - - -  ALT 0 - 44 U/L - - -    Lab Results  Component Value Date   PTH 70 (H) 08/08/2018   PTH Comment 08/08/2018   CALCIUM 7.8 (L) 08/19/2018   CAION 1.05 (L) 08/03/2018   PHOS 1.8 (L) 08/19/2018       Component Value Date/Time   COLORURINE YELLOW 07/17/2012 1751   APPEARANCEUR CLOUDY (A) 07/17/2012 1751   LABSPEC 1.020 07/17/2012 1751   PHURINE 5.5 07/17/2012 1751   GLUCOSEU NEGATIVE 07/17/2012 1751   HGBUR SMALL (A) 07/17/2012 1751   BILIRUBINUR NEGATIVE 07/17/2012 1751   KETONESUR NEGATIVE 07/17/2012 1751   PROTEINUR 30 (A) 07/17/2012 1751   UROBILINOGEN 0.2 07/17/2012 1751   NITRITE NEGATIVE 07/17/2012 1751   LEUKOCYTESUR SMALL (  A) 07/17/2012 1751      Component Value Date/Time   PHART 7.396 08/04/2018 1345   PCO2ART 33.6 08/04/2018 1345   PO2ART 182 (H) 08/04/2018 1345   HCO3 20.2 08/04/2018 1345   TCO2 23 08/03/2018 2248   ACIDBASEDEF 3.8 (H) 08/04/2018 1345   O2SAT 99.4 08/04/2018 1345       Component Value Date/Time   IRON 111 07/31/2018 0336   TIBC 136 (L) 07/31/2018 0336   FERRITIN 1,324 (H) 07/31/2018 0336   IRONPCTSAT 82 (H) 07/31/2018 0336       ASSESSMENT/PLAN:    78 year old female patient with chronic diastolic heart failure, coronary artery disease, type 2 diabetes, hypertension, hyperlipidemia, stroke and ESRD on hemodialysis Monday, Wednesday, and Friday who presented on 07/30/2018 with altered mental status and anemia with a hemoglobin of 5.7.  An EGD revealed an AVM.  She also underwent a colonoscopy and repeat EGD.  1.  End-stage renal disease on hemodialysis.  Had been doing dialysis on Monday, Wednesday, and Friday via [facility] here so far, last tx on  Wed.  attempt to challenge dry weight- difficult due to low BP.  Pre HD k 5.7 on Wed- on 2 K bath.  Normally TTS at [facility] - they are expecting her on Sat so she can just get HD 3 days, Mon, Wed and Sat this week.  So next HD will be as OP on Saturday- clinic aware.  There still is possibililty she will not go today so will put in HD orders for her here tomorrow as well in case she does not go   2.  Acute blood loss anemia.  ON PPI per GI.  Hemoglobin trending down but is still 10.1 today  - aranesp 200 q Friday- iron stores were OK early in hosp   3.  Secondary hyperparathyroidism.  PTH was 70.  Phosphorus stable at 2.3- no binder.  Will continue calcitriol given markedly low calcium level.   added a vitamin D for market vitamin D deficiency.  4. HTN/vol-  Seems overloaded UF as able- BP lowish on chronic midodrine   5. Pneumothorax-- s/p chest tube, removed and stable  6. Malnutrition- albumin in the 1's- not sure if there is plan for feeding tube- daughter says not not right now   Encourage PO's.  Significant FTT  7. Dispo-  Making sure daughter has supplies that she needs to care for her and that home therapies in place, they have refused hospice and SNF-  plan I am told is for discharge today- as above patient can go next to her OP unit in [place] for HD on Saturday but will put in back up orders for tomorrow in case does not go     [employer]

## 2018-08-19 NOTE — Progress Notes (Signed)
[facility] sent discharge summary, today's Nephrology note and H&P to OP HD clinic/[facility] per request and to provide continuity of care.  Alphonzo Cruise, [place] [facility]igator [phone]

## 2018-08-19 NOTE — TOC Transition Note (Signed)
Transition of Care [facility]) - CM/SW Discharge Note   Patient Details  Name: Courtney [name] MRN: [id number] Date of Birth: 1941-02-10  [facility] CM/SW Contact:  Sharin Mons, RN Phone Number: 08/19/2018, 12:18 PM   Clinical Narrative:    Admitted with Acute on chronic blood loss anemia/ Upper GI bleeding.Hx ESRD ON HD, TThsat, diastolic heart failure, stroke, CAD, DM, hypertension, hyperlipidemia. Pt will transition to home with home health services, [facility]. SOC to begin within 24-48 hrs.  Son to provide transportation home once hospital bed has delivered to pt's place of residence. [facility] states delivery time scheduled for 3p-7p window.   Final next level of care: [place] Barriers to Discharge: No Barriers Identified   Patient Goals and CMS Choice        Discharge Placement                       Discharge Plan and Services                          HH Arranged: RN, PT, Speech Therapy, OT, Nurse's Aide, Social Work   Date [place]: 08/19/18 Time [place]: 1218 Representative spoke with at [place]: [name], [place] shared d/c plan today  Social Determinants of Health (SDOH) Interventions     Readmission Risk Interventions No flowsheet data found.

## 2018-08-19 NOTE — Discharge Summary (Signed)
Physician Discharge Summary  Courtney [name] KWI:[id number] DOB: 04-16-1940 DOA: 07/30/2018  PCP: Neale Burly, MD  Admit date: 07/30/2018 Discharge date: 08/19/2018  Time spent: 45 minutes  Recommendations for Outpatient Follow-up:  Patient will be discharged to home with home health services.  Patient will need to follow up with primary care provider within one week of discharge. Follow up with gastroenterology.  Continue hemodialysis as scheduled. Patient should continue medications as prescribed.  Patient should follow a dysphagia 2 diet.   Discharge Diagnoses:  Acute on chronic blood loss anemia/ Upper GI bleeding  Iatrogenic pneumothorax End-stage renal disease on hemodialysis/Hypotension Recurrent pleural effusions Malnutrition/Hypoalbuminemia Generalized deconditioning/History of stroke/severe debility CAD Diabetes mellitus type II Acute hypoxic respiratory failure/COPD Chronic diastolic CHF Hyperkalemia  Discharge Condition: Stable  Diet recommendation: dysphagia 2  Filed Weights   08/17/18 0725 08/17/18 1025 08/19/18 0635  Weight: 33.3 kg 31.7 kg 32.5 kg    History of present illness:  on 07/30/2018 by Dr. Rosine Door a 78 y.o.femalewith medical history significant ofESRD ON HD, TThsat, diastolic heart failure, stroke, CAD , DM, hypertension, hyperlipidemia presents to ED from HD for hypotension. Minimal history from the patient, her son at bedside and reports she has been lethargic, slightly confused and weak since yesterday. No bloody stools at home.  No fevers or chills, no sob or chest pain. Pt denies any complaints. She would answer simple questions and go to sleep. Son reports she never got colonoscopy in the past , as she was high risk for anesthetic complications.  Hospital Course:  Acute on chronic blood loss anemia/ Upper GI bleeding  -most likely duodenal bleed -Patient presented with hemoglobin of 5.7. Has had 7 units of packed red cells  transfusion since admission, last transfusion was on 08/10/2018. -Hemoglobin 10.1 today. No overnight black or bloody stools -Gastroenterology consulted and appreciated -Tagged red cell study was positive with concern for proximal bleeding source such as duodenum or stomach. -Status post EGD on 08/03/2018 which showed duodenal AVM which was injected and treated with gold probe.  -Status post colonoscopy on 08/07/2018 which showed old blood in the entire colon, no active bleeding noted on lavage, few polyps noted but not removed and terminal ileum showed old blood as well. She had repeat EGD on 08/07/2018 which showed small amount of blood in fundus, duodenal ulcer and visible vessel without active bleeding for which one Endo Clip was placed. -Continue twice a day Protonix. On 08/11/2018, GI recommends PPI twice a day for at least 4 weeks thereafter once a day indefinitely. Aspirin and Plavix on hold. If absolutely needed, GI recommends aspirin 81 mg only, possibly after a week. GI has signed off. Outpatient follow-up with GI.  Iatrogenic pneumothorax -Resolved. Status post removal of chest tube on 08/09/2018. PCCM has signed off  End-stage renal disease on hemodialysis/Hypotension -Nephrology consulted and appreciated -Patient has outpatient slot with [facility] 11:15am -Blood pressure on the lower side but stable. Continue midodrine.  Recurrent pleural effusions -Status post left thoracentesis with subsequent pneumothorax and chest tube placement on 08/05/2018. Continue volume removal as per HD. Defer further thoracentesis unless patient is in severe respiratory distress  Malnutrition/Hypoalbuminemia -Patient continues to refuse to eat. Patient has pulled out her NG tube multiple times. Has been noted to have hypoglycemia -NG tube had been placed back again and feeding has been restarted. -Patient/family not interested in pursuing PEG tube feeding.  -Previous hospitalist had  an extensive discussion with [name]/daughter on the  phone again on 08/15/2018 and she states that her mother does not like the hospital food and would not want to have PEG tube as she might pull it out as well. Putting in NG tube every other day since the patientkeeps pulling the NG tube out,is also not a solution. Since patient is full code and not interested in hospice and does not want to go to SNF, encouraged [name] to visit her mom, talk to her mom and may be encourage her to eat more. Mother thinks that her mom would eat better at home. -Pending further decisions from daughter regarding mother's care.  -Likely to discharge patient home with home health on 08/19/2018 -Calorie count ordered  Generalized deconditioning/History of stroke/severe debility -Aspirin and Plavix on hold- see discussion above -Overall prognosis is guarded to poor.  -Palliative care consulted and appreciated. Patient remains full code at this time. Patient does not want a PEG tube. -Consider hospice/comfort measures. Currently patient/family not interested.  CAD -Stable. Aspirin/Plavix on hold  Diabetes mellitus type II -Blood sugars intermittently on the lower side as she has poor oral intake  Acute hypoxic respiratory failure/COPD -Was briefly intubated for procedure. -Resolved and currently on room air. -Continue PRN bronchodilators and pulmonary toiletry. -X-ray status stable currently.  Chronic diastolic CHF -Volume managed by dialysis.  Hyperkalemia -manage with hemodialysis  Consultants Gastroenterology PCCM Interventional radiology Nephrology Palliative care  Procedures  EGD x2 Colonoscopy Left thoracentesis Left chest tube placement on 08/05/2018, removal 08/09/2018 Left IJ venous catheter placement 08/07/2018   Discharge Exam: Vitals:   08/19/18 0619 08/19/18 0804  BP: (!) 107/48   Pulse:    Resp: 20   Temp:  97.9 F (36.6 C)  SpO2:       General: Well  developed, chroncially ill appearing, cachetic, NAD  HEENT: NCAT,  mucous membranes moist.  Cardiovascular: S1 S2 auscultated, RRR  Respiratory: Diminished breath sounds  Abdomen: Soft, nontender, nondistended, + bowel sounds  Extremities: warm dry without cyanosis clubbing or edema  Neuro: AAOx2 (self, place), nonfocal  Discharge Instructions Discharge Instructions    Discharge instructions   Complete by: As directed    Patient will be discharged to home with home health services.  Patient will need to follow up with primary care provider within one week of discharge. Follow up with gastroenterology.  Continue hemodialysis as scheduled. Patient should continue medications as prescribed.  Patient should follow a dysphagia 2 diet.     Allergies as of 08/19/2018      Reactions   Grapefruit Flavor [flavoring Agent]    Reaction unknown      Medication List    STOP taking these medications   clopidogrel 75 MG tablet Commonly known as: PLAVIX   furosemide 20 MG tablet Commonly known as: LASIX   hydrALAZINE 25 MG tablet Commonly known as: APRESOLINE   isosorbide mononitrate 30 MG 24 hr tablet Commonly known as: IMDUR   omeprazole 40 MG capsule Commonly known as: PRILOSEC   Phospha 250 Neutral [id number] MG Tabs     TAKE these medications   aspirin EC 81 MG tablet Take 81 mg by mouth daily.   atorvastatin 20 MG tablet Commonly known as: LIPITOR Take 20 mg by mouth daily.   calcium-vitamin D 500-200 MG-UNIT tablet Commonly known as: OSCAL WITH D Take 1 tablet by mouth.   cholecalciferol 10 MCG (400 UNIT) Tabs tablet Commonly known as: VITAMIN D3 Take 1,000 Units by mouth daily.   Creon 6000 units Cpep Generic drug: Pancrelipase (  Lip-Prot-Amyl) Take 1 capsule by mouth 3 (three) times daily with meals.   escitalopram 5 MG tablet Commonly known as: LEXAPRO Take 5 mg by mouth daily.   feeding supplement (PRO-STAT SUGAR FREE 64) Liqd Take 30 mLs by mouth 3  (three) times daily with meals.   loperamide 2 MG tablet Commonly known as: IMODIUM A-D Take 2 mg by mouth every 6 (six) hours as needed for diarrhea or loose stools.   midodrine 10 MG tablet Commonly known as: PROAMATINE Take 1 tablet (10 mg total) by mouth 3 (three) times daily with meals.   multivitamin Tabs tablet Take 1 tablet by mouth at bedtime.   oxyCODONE-acetaminophen 5-325 MG tablet Commonly known as: Roxicet Take 1 tablet by mouth every 6 (six) hours as needed for severe pain.   pantoprazole 40 MG tablet Commonly known as: PROTONIX Take 1 tablet (40 mg total) by mouth 2 (two) times daily.            Durable Medical Equipment  (From admission, onward)         Start     Ordered   08/18/18 1711  For home use only DME Hospital bed  Once    Question Answer Comment  Length of Need Lifetime   The above medical condition requires: Patient requires the ability to reposition frequently   Bed type Semi-electric   Hoyer Lift Yes   Support Surface: Gel Overlay      08/18/18 1710         Allergies  Allergen Reactions   Grapefruit Flavor [Flavoring Agent]     Reaction unknown   Follow-up Information    [facility] Follow up.   Why: home health services arranged       [facility] Follow up.   Why: hospital bed will be delivered to home prior to discharge       Arta Silence, MD. Schedule an appointment as soon as possible for a visit in 3 week(s).   Specialty: Gastroenterology Why: Hospital follow up Contact information: [address] [place] [address] [phone]        Neale Burly, MD. Schedule an appointment as soon as possible for a visit in 1 week(s).   Specialty: Internal Medicine Why: Hospital follow up Contact information: [place] [id number] [phone]            The results of significant diagnostics from this hospitalization (including imaging, microbiology,  ancillary and laboratory) are listed below for reference.    Significant Diagnostic Studies: Ct Abdomen Pelvis Wo Contrast  Result Date: 08/07/2018 CLINICAL DATA:  Severe abdominal pain post EGD and colonoscopy, severe anemia due to active GI bleeding, history end-stage renal disease on dialysis, coronary artery disease post MI, diabetes mellitus, hypertension, CHF, stroke EXAM: CT ABDOMEN AND PELVIS WITHOUT CONTRAST TECHNIQUE: Multidetector CT imaging of the abdomen and pelvis was performed following the standard protocol without IV contrast. Sagittal and coronal MPR images reconstructed from axial data set. Neither oral nor intravenous contrast were administered COMPARISON:  07/30/2018 FINDINGS: Lower chest: Bibasilar pleural effusions moderate RIGHT and small LEFT. LEFT basilar thoracostomy tube present with loculated pneumothorax at LEFT base. No mediastinal shift. Significant atelectasis of BILATERAL lower lobes. Hepatobiliary: Liver and gallbladder unremarkable Pancreas: Normal appearance Spleen: Normal appearance.  Small splenule inferior to spleen. Adrenals/Urinary Tract: Atrophic kidneys without gross mass or hydronephrosis. Adrenal glands unremarkable. Ureters not visualized. Bladder Schindler appears mildly thickened though bladder contains minimal  urine and is underdistended. Stomach/Bowel: Stomach and bowel loops poorly assessed due to lack of IV and oral contrast. Gastric Pun appears thickened though stomach is underdistended. No gross bowel dilatation or obvious Dettinger thickening. Vascular/Lymphatic: Extensive atherosclerotic calcifications of both large and small vessels. Aorta normal caliber. No gross adenopathy. Enlargement of cardiac chambers. No significant pericardial effusion. Reproductive: Unremarkable uterus.  Nonvisualization of ovaries. Other: Diffuse soft tissue edema throughout body Schmid and intra-abdominal/intrapelvic tissue planes. Scattered free fluid. No free air. No hernia.  Musculoskeletal: Bones markedly demineralized. Orthopedic hardware proximal RIGHT femur. IMPRESSION: Extensive diffuse soft tissue edema and scattered ascites. Moderate RIGHT and small LEFT pleural effusions with bibasilar atelectasis. No definite acute intra-abdominal or intrapelvic abnormalities otherwise identified within limitations of exam. Loculated pneumothorax at LEFT lung base despite chest tube. Findings called to Dr.  Roger Shelter on 08/07/2018 at 1522 hours. Electronically Signed   By: Lavonia Dana M.D.   On: 08/07/2018 15:22   Ct Abdomen Pelvis Wo Contrast  Result Date: 07/30/2018 CLINICAL DATA:  78 year old female with history of diverticulosis. Acute in the media from suspected blood-loss. EXAM: CT ABDOMEN AND PELVIS WITHOUT CONTRAST TECHNIQUE: Multidetector CT imaging of the abdomen and pelvis was performed following the standard protocol without IV contrast. COMPARISON:  CT the abdomen and pelvis 05/26/2017. FINDINGS: Lower chest: Low-attenuation in the intravascular compartment suggestive of anemia. Atherosclerotic calcifications in the thoracic aorta, as well as the left main, left anterior descending, left circumflex and right coronary arteries. Moderate right and large left pleural effusions with extensive passive atelectasis in the lower lungs bilaterally. Hepatobiliary: No definite suspicious cystic or solid hepatic lesions are noted on today's noncontrast CT examination. Gallbladder is nearly decompressed, but otherwise unremarkable in appearance. Pancreas: No definite pancreatic mass or peripancreatic fluid or inflammatory changes noted on today's noncontrast CT examination. Spleen: Unremarkable. Adrenals/Urinary Tract: Unenhanced appearance of the kidneys and bilateral adrenal glands are normal. Urinary bladder is normal in appearance. Stomach/Bowel: Normal appearance of the stomach. No pathologic dilatation of small bowel or colon. The appendix is not confidently identified and may be  surgically absent. Regardless, there are no inflammatory changes noted adjacent to the cecum to suggest the presence of an acute appendicitis at this time. Vascular/Lymphatic: Aortic atherosclerosis. No lymphadenopathy noted in the abdomen or pelvis. Reproductive: Uterus and ovaries are atrophic. Small amount of gas in the endometrial canal. Other: Trace volume of ascites.  No pneumoperitoneum. Musculoskeletal: There are no aggressive appearing lytic or blastic lesions noted in the visualized portions of the skeleton. Status post ORIF in the right proximal femur. IMPRESSION: 1. Small amount of gas in the endometrial canal. This is of uncertain etiology and significance, but is abnormal, and could indicate endometritis with infection from gas-forming organisms, or could be related to recent procedure. Further clinical evaluation is recommended. 2. No definite source for blood loss confidently identified in the abdomen or pelvis. 3. Small volume of ascites. 4. Large left and moderate right pleural effusions with extensive passive atelectasis in the lower lobes of the lungs bilaterally. 5. Aortic atherosclerosis, in addition to left main and 3 vessel coronary artery disease. Assessment for potential risk factor modification, dietary therapy or pharmacologic therapy may be warranted, if clinically indicated. 6. Additional incidental findings, as above. Electronically Signed   By: Vinnie Langton M.D.   On: 07/30/2018 22:19   Dg Abd 1 View  Result Date: 08/15/2018 CLINICAL DATA:  NG tube advancement. EXAM: ABDOMEN - 1 VIEW 9:53 a.m. COMPARISON:  08/15/2018 at 6:27 a.m.  FINDINGS: The NG tube has been advanced and the tip is now in the distal stomach. Bowel gas pattern is normal. Large left pleural effusion. Small right pleural effusion. No acute bone abnormality. IMPRESSION: NG tube now in good position in the distal stomach. No other significant change. Electronically Signed   By: Lorriane Shire M.D.   On:  08/15/2018 10:35   Dg Abd 1 View  Result Date: 08/15/2018 CLINICAL DATA:  NG tube placement. EXAM: ABDOMEN - 1 VIEW COMPARISON:  08/13/2018. FINDINGS: NG tube noted with tip over the upper portion of the stomach. Side hole is noted above the gastroesophageal junction. NG tube advancement of approximately 15 cm should be considered. Nondistended air-filled loops of small bowel again noted. Colonic gas pattern is normal. No free air. Cardiomegaly with bilateral pulmonary infiltrates/edema and bilateral pleural effusions again noted. IMPRESSION: 1. NG tube noted with tip over the upper portion of the stomach. Side hole is noted above the gastroesophageal junction. NG tube advancement of approximately 15 cm should be considered. 2.  Nondistended air-filled loops of small bowel again noted. 3. Cardiomegaly with bilateral pulmonary infiltrates/edema bilateral pleural effusions again noted. Electronically Signed   By: Marcello Moores  Register   On: 08/15/2018 07:58   Dg Abd 1 View  Result Date: 08/13/2018 CLINICAL DATA:  Nasogastric tube placement. EXAM: ABDOMEN - 1 VIEW COMPARISON:  08/11/2018 FINDINGS: Nasogastric tube is present with tip over the right mid abdomen likely within the distal stomach or proximal duodenum. Stable bibasilar opacification over the lung bases. Multiple air-filled nondilated small bowel loops present. Paucity of bowel gas within the colon. No free peritoneal air. Remainder the exam is unchanged. IMPRESSION: Nonspecific, nonobstructive bowel gas pattern with increased number of air-filled nondilated small bowel loops over the mid to lower abdomen. Nasogastric tube with tip over the right mid abdomen likely over the distal stomach or proximal duodenum. Electronically Signed   By: Marin Olp M.D.   On: 08/13/2018 09:13   Dg Abd 1 View  Result Date: 08/11/2018 CLINICAL DATA:  Evaluate nasogastric tube placement. EXAM: ABDOMEN - 1 VIEW COMPARISON:  Chest radiograph 08/10/2018 FINDINGS:  Nasogastric tube extends into the abdomen and appears to extend through the stomach. The tip is in the expected region of the proximal duodenum. Surgical hardware in the right hip and proximal right femur. There is a central line tip near the lower SVC. Densities at both lung bases compatible with bilateral pleural effusions, left side greater than right. Nonobstructive bowel gas pattern. Extensive vascular calcifications in the abdomen. IMPRESSION: Tip of the nasogastric tube is likely in the proximal duodenum. Electronically Signed   By: Markus Daft M.D.   On: 08/11/2018 11:53   Dg Abd 1 View  Result Date: 08/06/2018 CLINICAL DATA:  Reason for exam: Encounter for nasogastric (NG) tube placement EXAM: ABDOMEN - 1 VIEW COMPARISON:  Radiograph 08/04/2018 FINDINGS: NG tube extends the stomach. No dilated large or small bowel. Extensive vascular calcification noted. IMPRESSION: NG tube in stomach. Electronically Signed   By: Suzy Bouchard M.D.   On: 08/06/2018 18:47   Dg Abd 1 View  Result Date: 08/04/2018 CLINICAL DATA:  Check NG placement EXAM: ABDOMEN - 1 VIEW COMPARISON:  None. FINDINGS: Gastric catheter is noted within the stomach. Stomach remains distended with air. Large left-sided pleural effusion is seen. No free air is noted. No acute bony abnormality is noted. IMPRESSION: Gastric catheter within the stomach. Electronically Signed   By: Inez Catalina M.D.   On: 08/04/2018  08:12   Nm Gi Blood Loss  Result Date: 08/06/2018 CLINICAL DATA:  GI bleed, anemia, hemoglobin 6.6 g EXAM: NUCLEAR MEDICINE GASTROINTESTINAL BLEEDING SCAN TECHNIQUE: Sequential abdominal images were obtained following intravenous administration of Tc-84m labeled red blood cells. RADIOPHARMACEUTICALS:  25.8 mCi Tc-64m pertechnetate in-vitro labeled red cells. COMPARISON:  None Correlation: CT abdomen pelvis 07/30/2018 FINDINGS: Imaging performed for 2 hours. Normal blood pool distribution of labeled red cells. No abnormal  gastrointestinal localization of tracer identified. IMPRESSION: No scintigraphic evidence of active GI bleeding. Electronically Signed   By: Lavonia Dana M.D.   On: 08/06/2018 16:43   Nm Gi Blood Loss  Result Date: 08/03/2018 CLINICAL DATA:  Bloody stools, anemia, hypotensive EXAM: NUCLEAR MEDICINE GASTROINTESTINAL BLEEDING SCAN TECHNIQUE: Sequential abdominal images were obtained following intravenous administration of Tc-32m labeled red blood cells. RADIOPHARMACEUTICALS:  23.5 mCi Tc-38m pertechnetate in-vitro labeled red cells. COMPARISON:  CT abdomen pelvis, 07/30/2018 FINDINGS: There is abnormal radiotracer localization, which when comparing to anatomic configuration on prior CT appears to be within the gastric lumen or perhaps the proximal duodenum, and is noted to peristalsis throughout the small bowel and proximal colon for the duration of the study. IMPRESSION: Positive tagged red blood cell examination for gastrointestinal bleed, with a very proximal source, either the stomach or duodenum. Electronically Signed   By: Eddie Candle M.D.   On: 08/03/2018 14:58   Ir US Guide Vasc Access Right  Result Date: 07/20/2018 INDICATION: 78 year old with recently declotted right upper arm AV graft. The right arm graft and axilla were evaluated with ultrasound approximately 1 hour after the procedure and there was concern for a pseudoaneurysm in the right axilla. EXAM: RIGHT UPPER EXTREMITY SHUNTOGRAM PLACEMENT OF COVERED STENT ACROSS THE VENOUS ANASTOMOSIS AND OUTFLOW VEIN. BALLOON ANGIOPLASTY FOLLOWING STENT PLACEMENT MEDICATIONS: None. ANESTHESIA/SEDATION: None FLUOROSCOPY TIME:  Fluoroscopy Time: 2 minutes, 48 seconds, 4 mGy CONTRAST:  20 mL Omnipaque [id number] COMPLICATIONS: None immediate. PROCEDURE: Informed written consent was obtained from the patient after a thorough discussion of the procedural risks, benefits and alternatives. All questions were addressed. Maximal Sterile Barrier Technique was utilized  including caps, mask, sterile gowns, sterile gloves, sterile drape, hand hygiene and skin antiseptic. A timeout was performed prior to the initiation of the procedure. Right upper arm was prepped and draped in sterile fashion. Skin was anesthetized with 1% lidocaine. Using ultrasound guidance, 21 gauge needle was directed into the graft. Micropuncture dilator set was placed. Right upper extremity shuntogram was performed. An 8 French sheath was placed over a stiff Amplatz wire. Bentson wire was advanced into the central venous system. Additional shuntogram images were obtained. A 7 mm x 60 mm Covera stent was placed at the venous anastomosis and outflow vein. Stent was placed across the pseudoaneurysm origin. Follow-up shuntogram images obtained and the stent was balloon angioplastied with a 7 mm x 4 mm Conquest balloon. Final shuntogram images were obtained. Vascular sheath was removed with a pursestring suture. There was hemostasis at the puncture site at the end of the procedure. Dressing was placed over the right upper arm graft. FINDINGS: Following the declot procedure, the right upper arm graft and axilla was evaluated with ultrasound. There was concern for a pseudoaneurysm within the right axilla. Right upper extremity shuntogram images demonstrated a patent graft with a patent irregular outflow vein. There was increased filling of an irregular structure along the right upper arm outflow vein. Previously, this area was thought to represent a small branching vessel but findings are more convincing for  a pseudoaneurysm, particularly based on the ultrasound findings. In addition, there is a second area of irregularity along the outflow vein which could represent a second small pseudoaneurysm. As a result, both of these areas were treated with a covered stent. The outflow vein was widely patent following stent placement. There was no filling of the pseudoaneurysm(s) following stent placement. Venous anastomosis  was widely patent. IMPRESSION: Successful treatment of the pseudoaneurysm involving the right upper arm AV graft outflow vein. The venous anastomosis and outflow vein are widely patent following stent placement. The pseudoaneurysm was successfully occluded following covered stent placement. ACCESS: This access remains amenable to future percutaneous interventions as clinically indicated. Electronically Signed   By: Markus Daft M.D.   On: 07/20/2018 17:15   Dg Chest Port 1 View  Result Date: 08/14/2018 CLINICAL DATA:  Dyspnea. EXAM: PORTABLE CHEST 1 VIEW COMPARISON:  08/10/2018 FINDINGS: Interval removal of left IJ central venous catheter. Placement of nasogastric tube which courses into the midline upper abdomen as tip is not visualized. Slight interval worsening moderate size left effusion likely with associated basilar atelectasis. Stable to slight worsening small right pleural effusion likely with associated basilar atelectasis. Hazy prominence of the central pulmonary vessels which may indicated mild degree of edema. Infection in the mid to lower lungs is possible. Cardiomediastinal silhouette and remainder the exam is unchanged. IMPRESSION: Worsening moderate size left effusion and stable to slight worsening small right effusion likely with associated bibasilar atelectasis. Likely mild concomitant interstitial edema. Infection in the mid to lower lungs is possible. Nasogastric tube as described. Electronically Signed   By: Marin Olp M.D.   On: 08/14/2018 09:26   Dg Chest Port 1 View  Result Date: 08/10/2018 CLINICAL DATA:  Abnormal respirations. EXAM: PORTABLE CHEST 1 VIEW COMPARISON:  One-view chest x-ray 08/09/2018 FINDINGS: The heart is enlarged. Left-sided chest tube was removed. Small apical pneumothorax remains. Left IJ line is stable. Diffuse interstitial edema is stable. Right pleural effusion and airspace disease is stable. IMPRESSION: 1. Interval removal of left-sided chest tube without  significant change in large left pleural effusion or small atypical pneumothorax. 2. Stable right pleural effusion. 3. Stable bibasilar airspace disease. 4. Moderate edema unchanged. Electronically Signed   By: San Morelle M.D.   On: 08/10/2018 07:43   Dg Chest Port 1 View  Result Date: 08/09/2018 CLINICAL DATA:  Pneumothorax EXAM: PORTABLE CHEST 1 VIEW COMPARISON:  08/09/2018 FINDINGS: The left-sided chest tube is well positioned. There is a probable trace left apical pneumothorax, better visualized on this examination secondary to patient positioning. There are persistent bibasilar airspace opacities and bilateral pleural effusions. Heart size is stable. The left-sided central venous catheter is stable. IMPRESSION: 1. Probable trace left apical pneumothorax, better visualized on this exam secondary to patient positioning. 2. Stable lines and tubes. 3. Persistent bilateral pleural effusions with adjacent airspace opacities, similar to prior study. Electronically Signed   By: Constance Holster M.D.   On: 08/09/2018 17:22   Dg Chest Port 1 View  Result Date: 08/09/2018 CLINICAL DATA:  Follow-up pneumothorax EXAM: PORTABLE CHEST 1 VIEW COMPARISON:  08/08/2018 FINDINGS: Pigtail chest tube on the left unchanged in position. No pneumothorax on the current study. Mild increase in left pleural effusion and left lower lobe consolidation Right lower lobe airspace disease and small right effusion unchanged. Central line in the SVC at the cavoatrial junction unchanged. Stent in the right axillary region. IMPRESSION: Left pneumothorax has resolved. Progression of left pleural effusion and left lower lobe airspace  disease No change in right lower lobe airspace disease and right effusion. Electronically Signed   By: Franchot Gallo M.D.   On: 08/09/2018 11:25   Dg Chest Port 1 View  Result Date: 08/08/2018 CLINICAL DATA:  Left-sided pneumothorax status post chest tube placement EXAM: PORTABLE CHEST 1 VIEW  COMPARISON:  08/08/2018 FINDINGS: The left-sided chest tube and left-sided central venous catheter are both stable. There is a trace left apical pneumothorax. Bilateral pleural effusions are again noted which is stable from prior study. There is no acute osseous abnormality. A vascular stent is noted in the right axilla. The cardiac size is enlarged but stable from prior study. There is likely bibasilar atelectasis. IMPRESSION: 1. Stable lines and tubes. 2. Trace left apical pneumothorax. 3. Persistent bilateral pleural effusions, right greater than left. Electronically Signed   By: Constance Holster M.D.   On: 08/08/2018 19:21   Dg Chest Port 1 View  Result Date: 08/08/2018 CLINICAL DATA:  Left pneumothorax. EXAM: PORTABLE CHEST 1 VIEW COMPARISON:  08/07/2018 and 08/06/2018 FINDINGS: There is no visible residual left pneumothorax. There has been reaccumulation of the left base effusion. Right base effusion persist. Left chest tube and central venous catheter appear unchanged in position. Heart size and pulmonary vascularity are normal. Extensive aortic atherosclerosis. Diffuse osteopenia. IMPRESSION: 1. Resolution of the left base pneumothorax. 2. Reaccumulation of left base pleural effusion. 3. Persistent right pleural effusion. 4.  Aortic Atherosclerosis (ICD10-I70.0). Electronically Signed   By: Lorriane Shire M.D.   On: 08/08/2018 13:14   Dg Chest Port 1 View  Result Date: 08/07/2018 CLINICAL DATA:  78 y/o  F; central line placement. EXAM: PORTABLE CHEST 1 VIEW COMPARISON:  08/07/2018 chest radiograph. FINDINGS: Left central venous catheter tip projects over the lower SVC. Left-sided pleural drain is stable in position. Stable small left-sided pneumothorax at the lung base. Vascular stent projects over the right axilla. Stable hazy opacity at the right lung base probably representing effusion and atelectasis. Stable enlarged cardiac silhouette given projection and technique. IMPRESSION: Left central  venous catheter tip projects over lower SVC. Stable small left-sided pneumothorax and pleural drain. Stable hazy opacity at right lung base probably representing effusion and atelectasis. Electronically Signed   By: Kristine Garbe M.D.   On: 08/07/2018 23:59   Dg Chest Port 1 View  Result Date: 08/07/2018 CLINICAL DATA:  Follow-up left pneumothorax. End-stage renal disease on dialysis. Congestive heart failure. EXAM: PORTABLE CHEST 1 VIEW COMPARISON:  08/06/2018 FINDINGS: Left pleural pigtail catheter remains in place. Small left apex: Basilar pneumothorax shows no significant change in size. Left lower lung pleural-parenchymal scarring again noted. Cardiomegaly stable. Worsening asymmetric airspace disease is seen throughout the right lung, and increased size of small layering right pleural effusion is noted. IMPRESSION: 1. No significant change in small left pneumothorax. Left pleural pigtail catheter remains in place. 2. Worsening asymmetric airspace disease throughout right lung, and increased small layering right pleural effusion. 3. Stable cardiomegaly. Electronically Signed   By: Earle Gell M.D.   On: 08/07/2018 05:53   Dg Chest Port 1 View  Result Date: 08/06/2018 CLINICAL DATA:  Pneumothorax EXAM: PORTABLE CHEST 1 VIEW COMPARISON:  Portable exam 1239 hours compared to 0455 hours FINDINGS: Pigtail LEFT thoracostomy tube again seen. Small pneumothorax at LEFT apex. Question loculated pneumothorax versus skin folds at inferior LEFT chest. Improving aeration at LEFT lung base. Small RIGHT pleural effusion and basilar atelectasis with remaining RIGHT lung clear. Stable heart size mediastinal contours. IMPRESSION: Small LEFT apically and  question lateral LEFT basilar pneumothorax despite thoracostomy tube. Bibasilar atelectasis, with improved aeration on LEFT. Electronically Signed   By: Lavonia Dana M.D.   On: 08/06/2018 14:42   Dg Chest Port 1 View  Result Date: 08/06/2018 CLINICAL DATA:   Respiratory abnormalities EXAM: PORTABLE CHEST 1 VIEW COMPARISON:  August 05, 2018 FINDINGS: A left chest tube terminates over the left apex. The previously identified pneumothorax has resolved. Effusion and opacity in left base are mildly worsened. Probable small layering effusion on the right, similar in the interval. The cardiomediastinal silhouette is stable. No other abnormalities. IMPRESSION: 1. Left chest tube.  No pneumothorax. 2. Left effusion with underlying opacity, mildly worsened. Small right effusion is similar in the interval. Electronically Signed   By: Dorise Bullion III M.D   On: 08/06/2018 08:15   Dg Chest Port 1 View  Result Date: 08/05/2018 CLINICAL DATA:  Chest tube placement EXAM: PORTABLE CHEST 1 VIEW COMPARISON:  August 05, 2018 FINDINGS: A left-sided chest tube is been placed in the interval. The left pneumothorax is much smaller in the interval with only a small a focal component measuring 14 mm identified. Effusion and opacity is seen in the left base. Small right effusion. No other changes. IMPRESSION: 1. A left chest tube terminates near the left apex. The left-sided pneumothorax is much smaller measuring 14 mm at the apex. 2. Bilateral pleural effusions with underlying opacities. Electronically Signed   By: Dorise Bullion III M.D   On: 08/05/2018 15:45   Dg Chest Port 1 View  Result Date: 08/05/2018 CLINICAL DATA:  Status post left thoracentesis. EXAM: PORTABLE CHEST 1 VIEW COMPARISON:  08/05/2018 at 4:33 a.m. FINDINGS: There has been a significant decrease in pleural fluid on the left following thoracentesis. There is now a moderate-sized pneumothorax, likely ex vacuo, 50-60% in size. Minimal residual pleural fluid blunts the lateral left costophrenic sulcus. Stable small right pleural effusion. Stable bilateral vascular congestion. IMPRESSION: 1. Status post left thoracentesis with significant reduction in left pleural fluid, but now with a moderate size pneumothorax, 50-60%,  likely ex vacuo. Electronically Signed   By: Lajean Manes M.D.   On: 08/05/2018 14:25   Dg Chest Port 1 View  Result Date: 08/05/2018 CLINICAL DATA:  Respiratory failure. EXAM: PORTABLE CHEST 1 VIEW COMPARISON:  One-view chest x-ray 08/03/2018 FINDINGS: The patient has been extubated. The side port of the NG tube is just beyond the GE junction. Left subclavian line is stable. The heart is enlarged. Left pleural effusion and airspace disease has increased. There is progressive right perihilar and lower lobe airspace disease. A small right pleural effusion is now present. IMPRESSION: 1. Progressive left pleural effusion and airspace disease. 2. Increasing right pleural effusion and perihilar opacities. 3. Probable congestive heart failure. Electronically Signed   By: San Morelle M.D.   On: 08/05/2018 07:59   Dg Chest Port 1 View  Result Date: 08/03/2018 CLINICAL DATA:  78 year old female intubated. EXAM: PORTABLE CHEST 1 VIEW COMPARISON:  07/30/2018 and earlier. FINDINGS: Portable AP semi upright view at 2107 hours. Endotracheal tube tip located about 18 millimeters above the carina. Left subclavian central line has been placed, tip at the level of the carina. No enteric tube. No pneumothorax. Continued dense opacification of the left lower lung most resembling a moderate size pleural effusion. No pulmonary edema. Stable visible mediastinal contours. Nonspecific visible bowel gas pattern. Right axillary vascular stent again noted. IMPRESSION: 1. Endotracheal tube tip about 18 mm above the carina. 2. Left subclavian  central line placed, tip at the SVC level. 3. Continued moderate left pleural effusion. Electronically Signed   By: Genevie Ann M.D.   On: 08/03/2018 21:42   Dg Chest Portable 1 View  Result Date: 07/30/2018 CLINICAL DATA:  Hypotension. CHF. Diabetes. End-stage renal disease. EXAM: PORTABLE CHEST 1 VIEW COMPARISON:  09/08/2017 FINDINGS: Numerous leads and wires project over the chest. Right  axillary vascular stent. Moderate cardiomegaly. Atherosclerosis in the transverse aorta. Increase in moderate left pleural effusion. Small right pleural effusion, increased. Congestive heart failure is moderate and increased. Worse on the left. Left greater than right base airspace disease, increased. Skin fold over the left hemithorax. IMPRESSION: Worsened aeration with progressive congestive heart failure and left greater than right pleural effusions. Left greater than right base airspace disease could represent atelectasis. Concurrent infection cannot be excluded. Aortic Atherosclerosis (ICD10-I70.0). Electronically Signed   By: Abigail Miyamoto M.D.   On: 07/30/2018 16:14   Dg Abd Portable 1v  Result Date: 08/07/2018 CLINICAL DATA:  Nasogastric tube placement. EXAM: PORTABLE ABDOMEN - 1 VIEW COMPARISON:  08/06/2018 FINDINGS: Nasogastric tube is seen with tip overlying the distal body of the stomach. The bowel gas pattern is normal. Small right basilar pneumothorax is again seen with left pleural pigtail catheter in place. IMPRESSION: Nasogastric tube tip overlies the distal body of the stomach. Electronically Signed   By: Earle Gell M.D.   On: 08/07/2018 08:01   Ir Av Dialy Shunt Intro Needle/intrac Initial W/pta/stent/img Right  Result Date: 07/20/2018 INDICATION: 78 year old with recently declotted right upper arm AV graft. The right arm graft and axilla were evaluated with ultrasound approximately 1 hour after the procedure and there was concern for a pseudoaneurysm in the right axilla. EXAM: RIGHT UPPER EXTREMITY SHUNTOGRAM PLACEMENT OF COVERED STENT ACROSS THE VENOUS ANASTOMOSIS AND OUTFLOW VEIN. BALLOON ANGIOPLASTY FOLLOWING STENT PLACEMENT MEDICATIONS: None. ANESTHESIA/SEDATION: None FLUOROSCOPY TIME:  Fluoroscopy Time: 2 minutes, 48 seconds, 4 mGy CONTRAST:  20 mL Omnipaque 025 COMPLICATIONS: None immediate. PROCEDURE: Informed written consent was obtained from the patient after a thorough  discussion of the procedural risks, benefits and alternatives. All questions were addressed. Maximal Sterile Barrier Technique was utilized including caps, mask, sterile gowns, sterile gloves, sterile drape, hand hygiene and skin antiseptic. A timeout was performed prior to the initiation of the procedure. Right upper arm was prepped and draped in sterile fashion. Skin was anesthetized with 1% lidocaine. Using ultrasound guidance, 21 gauge needle was directed into the graft. Micropuncture dilator set was placed. Right upper extremity shuntogram was performed. An 8 French sheath was placed over a stiff Amplatz wire. Bentson wire was advanced into the central venous system. Additional shuntogram images were obtained. A 7 mm x 60 mm Covera stent was placed at the venous anastomosis and outflow vein. Stent was placed across the pseudoaneurysm origin. Follow-up shuntogram images obtained and the stent was balloon angioplastied with a 7 mm x 4 mm Conquest balloon. Final shuntogram images were obtained. Vascular sheath was removed with a pursestring suture. There was hemostasis at the puncture site at the end of the procedure. Dressing was placed over the right upper arm graft. FINDINGS: Following the declot procedure, the right upper arm graft and axilla was evaluated with ultrasound. There was concern for a pseudoaneurysm within the right axilla. Right upper extremity shuntogram images demonstrated a patent graft with a patent irregular outflow vein. There was increased filling of an irregular structure along the right upper arm outflow vein. Previously, this area was thought to  represent a small branching vessel but findings are more convincing for a pseudoaneurysm, particularly based on the ultrasound findings. In addition, there is a second area of irregularity along the outflow vein which could represent a second small pseudoaneurysm. As a result, both of these areas were treated with a covered stent. The outflow  vein was widely patent following stent placement. There was no filling of the pseudoaneurysm(s) following stent placement. Venous anastomosis was widely patent. IMPRESSION: Successful treatment of the pseudoaneurysm involving the right upper arm AV graft outflow vein. The venous anastomosis and outflow vein are widely patent following stent placement. The pseudoaneurysm was successfully occluded following covered stent placement. ACCESS: This access remains amenable to future percutaneous interventions as clinically indicated. Electronically Signed   By: Markus Daft M.D.   On: 07/20/2018 17:15    Microbiology: No results found for this or any previous visit (from the past 240 hour(s)).   Labs: Basic Metabolic Panel: Recent Labs  Lab 08/15/18 0334 08/16/18 0356 08/17/18 0353 08/18/18 0314 08/19/18 0254  NA 138 137 135 137 135  K 4.7 3.9 5.7* 4.5 4.6  CL 103 101 100 102 99  CO2 28 28 25 28 27   GLUCOSE 92 131* 133* 115* 111*  BUN 29* 14 24* 19 33*  CREATININE 3.47* 2.41* 3.22* 2.44* 3.25*  CALCIUM 7.6* 7.8* 7.5* 7.5* 7.8*  PHOS 2.0* 2.1* 2.3* 2.0* 1.8*   Liver Function Tests: Recent Labs  Lab 08/15/18 0334 08/16/18 0356 08/17/18 0353 08/18/18 0314 08/19/18 0254  ALBUMIN 1.8* 1.7* 1.7* 1.7* 1.8*   No results for input(s): LIPASE, AMYLASE in the last 168 hours. No results for input(s): AMMONIA in the last 168 hours. CBC: Recent Labs  Lab 08/15/18 0334 08/16/18 0631 08/17/18 0747 08/18/18 0314 08/19/18 0254  WBC 9.1 6.9 7.0 6.1 6.3  HGB 10.7* 10.0* 10.1* 10.4* 10.1*  HCT 34.2* 32.8* 33.3* 33.7* 33.0*  MCV 96.1 97.6 97.1 96.8 95.4  PLT 163 143* 201 168 205   Cardiac Enzymes: No results for input(s): CKTOTAL, CKMB, CKMBINDEX, TROPONINI in the last 168 hours. BNP: BNP (last 3 results) No results for input(s): BNP in the last 8760 hours.  ProBNP (last 3 results) No results for input(s): PROBNP in the last 8760 hours.  CBG: Recent Labs  Lab 08/18/18 1645  08/18/18 2019 08/19/18 0022 08/19/18 0437 08/19/18 0749  GLUCAP 81 84 105* 93 143*       Signed:  Nataleah Scioneaux  [facility] 08/19/2018, 10:24 AM

## 2018-08-19 NOTE — Progress Notes (Addendum)
1502: Call to daughter [name]. Updated on condition.  1857: Spoke with MD. Patient daughter has not called to inform of bed arriving. Spoke previously to daughter, stated bed for patient would arrive between 1500 and 1900 and that she would call to update.   1930: Call from daughter [name]. States brother will be here to pick up mother in approximately 60-18min.

## 2018-08-20 DIAGNOSIS — Z992 Dependence on renal dialysis: Secondary | ICD-10-CM | POA: Diagnosis not present

## 2018-08-20 DIAGNOSIS — D631 Anemia in chronic kidney disease: Secondary | ICD-10-CM | POA: Diagnosis not present

## 2018-08-20 DIAGNOSIS — N2581 Secondary hyperparathyroidism of renal origin: Secondary | ICD-10-CM | POA: Diagnosis not present

## 2018-08-20 DIAGNOSIS — N186 End stage renal disease: Secondary | ICD-10-CM | POA: Diagnosis not present

## 2018-08-20 NOTE — Progress Notes (Signed)
Patient discharged to her son, Courtney [name].  IV and NG was removed prior to discharge.    Discharged was given and reviewed with the son.  Patient belongings and meds was given to the son as well.

## 2018-08-21 DIAGNOSIS — I69351 Hemiplegia and hemiparesis following cerebral infarction affecting right dominant side: Secondary | ICD-10-CM | POA: Diagnosis not present

## 2018-08-21 DIAGNOSIS — J449 Chronic obstructive pulmonary disease, unspecified: Secondary | ICD-10-CM | POA: Diagnosis not present

## 2018-08-21 DIAGNOSIS — Z4800 Encounter for change or removal of nonsurgical wound dressing: Secondary | ICD-10-CM | POA: Diagnosis not present

## 2018-08-21 DIAGNOSIS — N186 End stage renal disease: Secondary | ICD-10-CM | POA: Diagnosis not present

## 2018-08-21 DIAGNOSIS — L89312 Pressure ulcer of right buttock, stage 2: Secondary | ICD-10-CM | POA: Diagnosis not present

## 2018-08-21 DIAGNOSIS — I5032 Chronic diastolic (congestive) heart failure: Secondary | ICD-10-CM | POA: Diagnosis not present

## 2018-08-21 DIAGNOSIS — I110 Hypertensive heart disease with heart failure: Secondary | ICD-10-CM | POA: Diagnosis not present

## 2018-08-21 DIAGNOSIS — D649 Anemia, unspecified: Secondary | ICD-10-CM | POA: Diagnosis not present

## 2018-08-21 DIAGNOSIS — E46 Unspecified protein-calorie malnutrition: Secondary | ICD-10-CM | POA: Diagnosis not present

## 2018-08-21 DIAGNOSIS — I2510 Atherosclerotic heart disease of native coronary artery without angina pectoris: Secondary | ICD-10-CM | POA: Diagnosis not present

## 2018-08-21 DIAGNOSIS — R1310 Dysphagia, unspecified: Secondary | ICD-10-CM | POA: Diagnosis not present

## 2018-08-21 DIAGNOSIS — L89152 Pressure ulcer of sacral region, stage 2: Secondary | ICD-10-CM | POA: Diagnosis not present

## 2018-08-21 DIAGNOSIS — Z992 Dependence on renal dialysis: Secondary | ICD-10-CM | POA: Diagnosis not present

## 2018-08-21 DIAGNOSIS — E1122 Type 2 diabetes mellitus with diabetic chronic kidney disease: Secondary | ICD-10-CM | POA: Diagnosis not present

## 2018-08-22 DIAGNOSIS — Z992 Dependence on renal dialysis: Secondary | ICD-10-CM | POA: Diagnosis not present

## 2018-08-22 DIAGNOSIS — D649 Anemia, unspecified: Secondary | ICD-10-CM | POA: Diagnosis not present

## 2018-08-22 DIAGNOSIS — E1122 Type 2 diabetes mellitus with diabetic chronic kidney disease: Secondary | ICD-10-CM | POA: Diagnosis not present

## 2018-08-22 DIAGNOSIS — I2510 Atherosclerotic heart disease of native coronary artery without angina pectoris: Secondary | ICD-10-CM | POA: Diagnosis not present

## 2018-08-22 DIAGNOSIS — N186 End stage renal disease: Secondary | ICD-10-CM | POA: Diagnosis not present

## 2018-08-22 DIAGNOSIS — E46 Unspecified protein-calorie malnutrition: Secondary | ICD-10-CM | POA: Diagnosis not present

## 2018-08-23 DIAGNOSIS — N186 End stage renal disease: Secondary | ICD-10-CM | POA: Diagnosis not present

## 2018-08-23 DIAGNOSIS — N2581 Secondary hyperparathyroidism of renal origin: Secondary | ICD-10-CM | POA: Diagnosis not present

## 2018-08-23 DIAGNOSIS — Z992 Dependence on renal dialysis: Secondary | ICD-10-CM | POA: Diagnosis not present

## 2018-08-23 DIAGNOSIS — D631 Anemia in chronic kidney disease: Secondary | ICD-10-CM | POA: Diagnosis not present

## 2018-08-24 DIAGNOSIS — E1122 Type 2 diabetes mellitus with diabetic chronic kidney disease: Secondary | ICD-10-CM | POA: Diagnosis not present

## 2018-08-24 DIAGNOSIS — E46 Unspecified protein-calorie malnutrition: Secondary | ICD-10-CM | POA: Diagnosis not present

## 2018-08-24 DIAGNOSIS — Z992 Dependence on renal dialysis: Secondary | ICD-10-CM | POA: Diagnosis not present

## 2018-08-24 DIAGNOSIS — I2510 Atherosclerotic heart disease of native coronary artery without angina pectoris: Secondary | ICD-10-CM | POA: Diagnosis not present

## 2018-08-24 DIAGNOSIS — N186 End stage renal disease: Secondary | ICD-10-CM | POA: Diagnosis not present

## 2018-08-24 DIAGNOSIS — D649 Anemia, unspecified: Secondary | ICD-10-CM | POA: Diagnosis not present

## 2018-08-25 DIAGNOSIS — D631 Anemia in chronic kidney disease: Secondary | ICD-10-CM | POA: Diagnosis not present

## 2018-08-25 DIAGNOSIS — Z992 Dependence on renal dialysis: Secondary | ICD-10-CM | POA: Diagnosis not present

## 2018-08-25 DIAGNOSIS — N186 End stage renal disease: Secondary | ICD-10-CM | POA: Diagnosis not present

## 2018-08-25 DIAGNOSIS — N2581 Secondary hyperparathyroidism of renal origin: Secondary | ICD-10-CM | POA: Diagnosis not present

## 2018-08-25 DIAGNOSIS — D509 Iron deficiency anemia, unspecified: Secondary | ICD-10-CM | POA: Diagnosis not present

## 2018-08-26 DIAGNOSIS — E1122 Type 2 diabetes mellitus with diabetic chronic kidney disease: Secondary | ICD-10-CM | POA: Diagnosis not present

## 2018-08-26 DIAGNOSIS — I2510 Atherosclerotic heart disease of native coronary artery without angina pectoris: Secondary | ICD-10-CM | POA: Diagnosis not present

## 2018-08-26 DIAGNOSIS — E46 Unspecified protein-calorie malnutrition: Secondary | ICD-10-CM | POA: Diagnosis not present

## 2018-08-26 DIAGNOSIS — N186 End stage renal disease: Secondary | ICD-10-CM | POA: Diagnosis not present

## 2018-08-26 DIAGNOSIS — Z992 Dependence on renal dialysis: Secondary | ICD-10-CM | POA: Diagnosis not present

## 2018-08-26 DIAGNOSIS — D649 Anemia, unspecified: Secondary | ICD-10-CM | POA: Diagnosis not present

## 2018-08-27 DIAGNOSIS — Z992 Dependence on renal dialysis: Secondary | ICD-10-CM | POA: Diagnosis not present

## 2018-08-27 DIAGNOSIS — N186 End stage renal disease: Secondary | ICD-10-CM | POA: Diagnosis not present

## 2018-08-27 DIAGNOSIS — D631 Anemia in chronic kidney disease: Secondary | ICD-10-CM | POA: Diagnosis not present

## 2018-08-27 DIAGNOSIS — D509 Iron deficiency anemia, unspecified: Secondary | ICD-10-CM | POA: Diagnosis not present

## 2018-08-27 DIAGNOSIS — N2581 Secondary hyperparathyroidism of renal origin: Secondary | ICD-10-CM | POA: Diagnosis not present

## 2018-08-29 DIAGNOSIS — Z992 Dependence on renal dialysis: Secondary | ICD-10-CM | POA: Diagnosis not present

## 2018-08-29 DIAGNOSIS — D649 Anemia, unspecified: Secondary | ICD-10-CM | POA: Diagnosis not present

## 2018-08-29 DIAGNOSIS — E46 Unspecified protein-calorie malnutrition: Secondary | ICD-10-CM | POA: Diagnosis not present

## 2018-08-29 DIAGNOSIS — N186 End stage renal disease: Secondary | ICD-10-CM | POA: Diagnosis not present

## 2018-08-29 DIAGNOSIS — I2510 Atherosclerotic heart disease of native coronary artery without angina pectoris: Secondary | ICD-10-CM | POA: Diagnosis not present

## 2018-08-29 DIAGNOSIS — E1122 Type 2 diabetes mellitus with diabetic chronic kidney disease: Secondary | ICD-10-CM | POA: Diagnosis not present

## 2018-08-30 DIAGNOSIS — N186 End stage renal disease: Secondary | ICD-10-CM | POA: Diagnosis not present

## 2018-08-30 DIAGNOSIS — D631 Anemia in chronic kidney disease: Secondary | ICD-10-CM | POA: Diagnosis not present

## 2018-08-30 DIAGNOSIS — N2581 Secondary hyperparathyroidism of renal origin: Secondary | ICD-10-CM | POA: Diagnosis not present

## 2018-08-30 DIAGNOSIS — Z992 Dependence on renal dialysis: Secondary | ICD-10-CM | POA: Diagnosis not present

## 2018-08-30 DIAGNOSIS — D509 Iron deficiency anemia, unspecified: Secondary | ICD-10-CM | POA: Diagnosis not present

## 2018-08-31 DIAGNOSIS — E1122 Type 2 diabetes mellitus with diabetic chronic kidney disease: Secondary | ICD-10-CM | POA: Diagnosis not present

## 2018-08-31 DIAGNOSIS — D649 Anemia, unspecified: Secondary | ICD-10-CM | POA: Diagnosis not present

## 2018-08-31 DIAGNOSIS — I2510 Atherosclerotic heart disease of native coronary artery without angina pectoris: Secondary | ICD-10-CM | POA: Diagnosis not present

## 2018-08-31 DIAGNOSIS — Z992 Dependence on renal dialysis: Secondary | ICD-10-CM | POA: Diagnosis not present

## 2018-08-31 DIAGNOSIS — E46 Unspecified protein-calorie malnutrition: Secondary | ICD-10-CM | POA: Diagnosis not present

## 2018-08-31 DIAGNOSIS — N186 End stage renal disease: Secondary | ICD-10-CM | POA: Diagnosis not present

## 2018-09-01 ENCOUNTER — Other Ambulatory Visit: Payer: Self-pay

## 2018-09-01 ENCOUNTER — Emergency Department (HOSPITAL_COMMUNITY): Payer: Medicare Other

## 2018-09-01 ENCOUNTER — Observation Stay (HOSPITAL_COMMUNITY): Payer: Medicare Other

## 2018-09-01 ENCOUNTER — Inpatient Hospital Stay (HOSPITAL_COMMUNITY)
Admission: EM | Admit: 2018-09-01 | Discharge: 2018-09-03 | DRG: 640 | Disposition: A | Discharge status: Home / Self Care | Payer: Medicare Other | Attending: Family Medicine | Admitting: Family Medicine

## 2018-09-01 DIAGNOSIS — G4710 Hypersomnia, unspecified: Secondary | ICD-10-CM

## 2018-09-01 DIAGNOSIS — E8770 Fluid overload, unspecified: Principal | ICD-10-CM | POA: Diagnosis present

## 2018-09-01 DIAGNOSIS — E785 Hyperlipidemia, unspecified: Secondary | ICD-10-CM | POA: Diagnosis present

## 2018-09-01 DIAGNOSIS — J90 Pleural effusion, not elsewhere classified: Secondary | ICD-10-CM

## 2018-09-01 DIAGNOSIS — I252 Old myocardial infarction: Secondary | ICD-10-CM

## 2018-09-01 DIAGNOSIS — I69351 Hemiplegia and hemiparesis following cerebral infarction affecting right dominant side: Secondary | ICD-10-CM | POA: Diagnosis not present

## 2018-09-01 DIAGNOSIS — R0602 Shortness of breath: Secondary | ICD-10-CM

## 2018-09-01 DIAGNOSIS — I132 Hypertensive heart and chronic kidney disease with heart failure and with stage 5 chronic kidney disease, or end stage renal disease: Secondary | ICD-10-CM | POA: Diagnosis present

## 2018-09-01 DIAGNOSIS — I120 Hypertensive chronic kidney disease with stage 5 chronic kidney disease or end stage renal disease: Secondary | ICD-10-CM | POA: Diagnosis not present

## 2018-09-01 DIAGNOSIS — Z992 Dependence on renal dialysis: Secondary | ICD-10-CM

## 2018-09-01 DIAGNOSIS — E119 Type 2 diabetes mellitus without complications: Secondary | ICD-10-CM

## 2018-09-01 DIAGNOSIS — N186 End stage renal disease: Secondary | ICD-10-CM | POA: Diagnosis not present

## 2018-09-01 DIAGNOSIS — E11649 Type 2 diabetes mellitus with hypoglycemia without coma: Secondary | ICD-10-CM | POA: Diagnosis not present

## 2018-09-01 DIAGNOSIS — R4020 Unspecified coma: Secondary | ICD-10-CM | POA: Diagnosis not present

## 2018-09-01 DIAGNOSIS — Z8673 Personal history of transient ischemic attack (TIA), and cerebral infarction without residual deficits: Secondary | ICD-10-CM | POA: Diagnosis not present

## 2018-09-01 DIAGNOSIS — L89159 Pressure ulcer of sacral region, unspecified stage: Secondary | ICD-10-CM

## 2018-09-01 DIAGNOSIS — Z91018 Allergy to other foods: Secondary | ICD-10-CM

## 2018-09-01 DIAGNOSIS — Z1159 Encounter for screening for other viral diseases: Secondary | ICD-10-CM | POA: Diagnosis not present

## 2018-09-01 DIAGNOSIS — L8990 Pressure ulcer of unspecified site, unspecified stage: Secondary | ICD-10-CM | POA: Diagnosis present

## 2018-09-01 DIAGNOSIS — Z681 Body mass index (BMI) 19 or less, adult: Secondary | ICD-10-CM

## 2018-09-01 DIAGNOSIS — I5032 Chronic diastolic (congestive) heart failure: Secondary | ICD-10-CM | POA: Diagnosis present

## 2018-09-01 DIAGNOSIS — Z8249 Family history of ischemic heart disease and other diseases of the circulatory system: Secondary | ICD-10-CM

## 2018-09-01 DIAGNOSIS — E43 Unspecified severe protein-calorie malnutrition: Secondary | ICD-10-CM | POA: Diagnosis not present

## 2018-09-01 DIAGNOSIS — R627 Adult failure to thrive: Secondary | ICD-10-CM | POA: Diagnosis not present

## 2018-09-01 DIAGNOSIS — E1122 Type 2 diabetes mellitus with diabetic chronic kidney disease: Secondary | ICD-10-CM | POA: Diagnosis present

## 2018-09-01 DIAGNOSIS — Z993 Dependence on wheelchair: Secondary | ICD-10-CM

## 2018-09-01 DIAGNOSIS — Z7982 Long term (current) use of aspirin: Secondary | ICD-10-CM

## 2018-09-01 DIAGNOSIS — I2510 Atherosclerotic heart disease of native coronary artery without angina pectoris: Secondary | ICD-10-CM | POA: Diagnosis present

## 2018-09-01 LAB — HEPATIC FUNCTION PANEL
ALT: 13 U/L (ref 0–44)
AST: 27 U/L (ref 15–41)
Albumin: 1.8 g/dL — ABNORMAL LOW (ref 3.5–5.0)
Alkaline Phosphatase: 76 U/L (ref 38–126)
Bilirubin, Direct: 0.2 mg/dL (ref 0.0–0.2)
Indirect Bilirubin: 0.5 mg/dL (ref 0.3–0.9)
Total Bilirubin: 0.7 mg/dL (ref 0.3–1.2)
Total Protein: 5.3 g/dL — ABNORMAL LOW (ref 6.5–8.1)

## 2018-09-01 LAB — POCT I-STAT 7, (LYTES, BLD GAS, ICA,H+H)
Acid-Base Excess: 4 mmol/L — ABNORMAL HIGH (ref 0.0–2.0)
Bicarbonate: 28.4 mmol/L — ABNORMAL HIGH (ref 20.0–28.0)
Calcium, Ion: 1.16 mmol/L (ref 1.15–1.40)
HCT: 38 % (ref 36.0–46.0)
Hemoglobin: 12.9 g/dL (ref 12.0–15.0)
O2 Saturation: 95 %
Potassium: 4.1 mmol/L (ref 3.5–5.1)
Sodium: 133 mmol/L — ABNORMAL LOW (ref 135–145)
TCO2: 30 mmol/L (ref 22–32)
pCO2 arterial: 42.4 mmHg (ref 32.0–48.0)
pH, Arterial: 7.434 (ref 7.350–7.450)
pO2, Arterial: 72 mmHg — ABNORMAL LOW (ref 83.0–108.0)

## 2018-09-01 LAB — CBC
HCT: 42 % (ref 36.0–46.0)
Hemoglobin: 12.4 g/dL (ref 12.0–15.0)
MCH: 27.7 pg (ref 26.0–34.0)
MCHC: 29.5 g/dL — ABNORMAL LOW (ref 30.0–36.0)
MCV: 93.8 fL (ref 80.0–100.0)
Platelets: 285 10*3/uL (ref 150–400)
RBC: 4.48 MIL/uL (ref 3.87–5.11)
RDW: 16.7 % — ABNORMAL HIGH (ref 11.5–15.5)
WBC: 5.4 10*3/uL (ref 4.0–10.5)
nRBC: 0 % (ref 0.0–0.2)

## 2018-09-01 LAB — URINALYSIS, ROUTINE W REFLEX MICROSCOPIC
Bacteria, UA: NONE SEEN
Bilirubin Urine: NEGATIVE
Glucose, UA: NEGATIVE mg/dL
Hgb urine dipstick: NEGATIVE
Ketones, ur: NEGATIVE mg/dL
Nitrite: NEGATIVE
Protein, ur: 100 mg/dL — AB
Specific Gravity, Urine: 1.012 (ref 1.005–1.030)
pH: 6 (ref 5.0–8.0)

## 2018-09-01 LAB — BASIC METABOLIC PANEL
Anion gap: 9 (ref 5–15)
BUN: 24 mg/dL — ABNORMAL HIGH (ref 8–23)
CO2: 25 mmol/L (ref 22–32)
Calcium: 8 mg/dL — ABNORMAL LOW (ref 8.9–10.3)
Chloride: 100 mmol/L (ref 98–111)
Creatinine, Ser: 4.59 mg/dL — ABNORMAL HIGH (ref 0.44–1.00)
GFR calc Af Amer: 10 mL/min — ABNORMAL LOW (ref >60)
GFR calc non Af Amer: 9 mL/min — ABNORMAL LOW (ref >60)
Glucose, Bld: 68 mg/dL — ABNORMAL LOW (ref 70–99)
Potassium: 4.6 mmol/L (ref 3.5–5.1)
Sodium: 134 mmol/L — ABNORMAL LOW (ref 135–145)

## 2018-09-01 LAB — PHOSPHORUS: Phosphorus: 3.1 mg/dL (ref 2.5–4.6)

## 2018-09-01 LAB — SARS CORONAVIRUS 2 BY RT PCR (HOSPITAL ORDER, PERFORMED IN ~~LOC~~ HOSPITAL LAB): SARS Coronavirus 2: NEGATIVE

## 2018-09-01 LAB — LACTATE DEHYDROGENASE: LDH: 231 U/L — ABNORMAL HIGH (ref 98–192)

## 2018-09-01 LAB — MAGNESIUM: Magnesium: 2.2 mg/dL (ref 1.7–2.4)

## 2018-09-01 LAB — TSH: TSH: 3.382 u[IU]/mL (ref 0.350–4.500)

## 2018-09-01 LAB — AMMONIA: Ammonia: 12 umol/L (ref 9–35)

## 2018-09-01 LAB — BRAIN NATRIURETIC PEPTIDE: B Natriuretic Peptide: >4500 pg/mL — ABNORMAL HIGH (ref 0.0–100.0)

## 2018-09-01 LAB — CBG MONITORING, ED: Glucose-Capillary: 111 mg/dL — ABNORMAL HIGH (ref 70–99)

## 2018-09-01 MED ORDER — ONDANSETRON HCL 4 MG/2ML IJ SOLN
4.0000 mg | Freq: Four times a day (QID) | INTRAMUSCULAR | Status: DC | PRN
  Administered 2018-09-03: 4 mg via INTRAVENOUS
  Filled 2018-09-01: qty 2

## 2018-09-01 MED ORDER — ONDANSETRON HCL 4 MG PO TABS: 4.0000 mg | ORAL_TABLET | Freq: Four times a day (QID) | ORAL | Status: DC | PRN

## 2018-09-01 MED ORDER — NEPRO/CARBSTEADY PO LIQD: 237.0000 mL | Freq: Two times a day (BID) | ORAL | Status: DC

## 2018-09-01 MED ORDER — CALCIUM CARBONATE-VITAMIN D 500-200 MG-UNIT PO TABS
1.0000 | ORAL_TABLET | Freq: Every day | ORAL | Status: DC
  Administered 2018-09-02 – 2018-09-03 (×2): 1 via ORAL
  Filled 2018-09-01 (×2): qty 1

## 2018-09-01 MED ORDER — ASPIRIN EC 81 MG PO TBEC
81.0000 mg | DELAYED_RELEASE_TABLET | Freq: Every day | ORAL | Status: DC
  Administered 2018-09-02 – 2018-09-03 (×2): 81 mg via ORAL
  Filled 2018-09-01 (×2): qty 1

## 2018-09-01 MED ORDER — PRO-STAT SUGAR FREE PO LIQD
30.0000 mL | Freq: Three times a day (TID) | ORAL | Status: DC
  Administered 2018-09-02 – 2018-09-03 (×5): 30 mL via ORAL
  Filled 2018-09-01 (×5): qty 30

## 2018-09-01 MED ORDER — ACETAMINOPHEN 650 MG RE SUPP: 650.0000 mg | Freq: Four times a day (QID) | RECTAL | Status: DC | PRN

## 2018-09-01 MED ORDER — ACETAMINOPHEN 325 MG PO TABS: 650.0000 mg | ORAL_TABLET | Freq: Four times a day (QID) | ORAL | Status: DC | PRN

## 2018-09-01 MED ORDER — DEXTROSE 50 % IV SOLN
25.0000 mL | Freq: Once | INTRAVENOUS | Status: AC
  Administered 2018-09-01: 25 mL via INTRAVENOUS
  Filled 2018-09-01: qty 50

## 2018-09-01 MED ORDER — MIDODRINE HCL 5 MG PO TABS
10.0000 mg | ORAL_TABLET | Freq: Three times a day (TID) | ORAL | Status: DC
  Administered 2018-09-02 – 2018-09-03 (×6): 10 mg via ORAL
  Filled 2018-09-01 (×5): qty 2

## 2018-09-01 MED ORDER — ESCITALOPRAM OXALATE 10 MG PO TABS
5.0000 mg | ORAL_TABLET | Freq: Every day | ORAL | Status: DC
  Administered 2018-09-02 – 2018-09-03 (×2): 5 mg via ORAL
  Filled 2018-09-01 (×2): qty 1

## 2018-09-01 MED ORDER — RENA-VITE PO TABS
1.0000 | ORAL_TABLET | Freq: Every day | ORAL | Status: DC
  Administered 2018-09-02: 1 via ORAL
  Filled 2018-09-01: qty 1

## 2018-09-01 MED ORDER — PANCRELIPASE (LIP-PROT-AMYL) 12000-38000 UNITS PO CPEP
12000.0000 [IU] | ORAL_CAPSULE | Freq: Three times a day (TID) | ORAL | Status: DC
  Administered 2018-09-02 – 2018-09-03 (×4): 12000 [IU] via ORAL
  Filled 2018-09-01 (×4): qty 1

## 2018-09-01 MED ORDER — PANTOPRAZOLE SODIUM 40 MG PO TBEC
40.0000 mg | DELAYED_RELEASE_TABLET | Freq: Two times a day (BID) | ORAL | Status: DC
  Administered 2018-09-02 – 2018-09-03 (×3): 40 mg via ORAL
  Filled 2018-09-01 (×3): qty 1

## 2018-09-01 MED ORDER — VITAMIN D 25 MCG (1000 UNIT) PO TABS
1000.0000 [IU] | ORAL_TABLET | Freq: Every day | ORAL | Status: DC
  Administered 2018-09-02 – 2018-09-03 (×2): 1000 [IU] via ORAL
  Filled 2018-09-01 (×2): qty 3
  Filled 2018-09-01: qty 1

## 2018-09-01 MED ORDER — ATORVASTATIN CALCIUM 10 MG PO TABS
20.0000 mg | ORAL_TABLET | Freq: Every day | ORAL | Status: DC
  Administered 2018-09-02 – 2018-09-03 (×2): 20 mg via ORAL
  Filled 2018-09-01 (×2): qty 2

## 2018-09-01 NOTE — H&P (Signed)
History and Physical    TEMARA LANUM UDJ:[id number] DOB: 09-08-1940 DOA: 09/01/2018  PCP: Neale Burly, MD  Patient coming from: Home  I have personally briefly reviewed patient's old medical records in [place]  Chief Complaint: SOB  HPI: Courtney [name] is a 78 y.o. female with medical history significant of Stroke, ESRD on TTS dialysis, DM, on chronic midodrine, severe malnutrition.  Wheelchair bound at baseline.  Patient admitted for GIB from duodenal ulcer last month, this admit complicated by CHF, pleural effusion, iatrogenic pneumothorax after drainage of pleural effusion, intubation.  Reoccurrence of plural effusion but not drained secondary to no severe SOB with it and wanting to avoid a repeat of the PTX.  For a full review of the admission please see the discharge summary on 6/26.  Patient with very poor PO intake for past 3-4 days.  She arrived to dialysis clinic today and the center sent her to the ED out of concern for her increased work of breathing and near absent lung sounds on the left.  Daughter feels that for the last two weeks since the patient was discharged home from the hospital the patient has become more short-of-breath by watching her despite the patient not complaining of SOB.  Patient herself denies SOB.  Feels like she is getting enough oxygen.  Denies pain anywhere.   ED Course: CBG 68, BUN 24, CREAT 4.59, albumin 1.8  CXR shows slightly increased large pleural effusion compared to 6/21  ABG: 7.434, 42.4, 72.0, 28.4, satting 95% room air.   Review of Systems: As per HPI, otherwise all review of systems negative.  Past Medical History:  Diagnosis Date  . Anemia 07/2018  . CHF (congestive heart failure) ([place])   . Coronary artery disease   . Diabetes mellitus without complication ([place])   . ESRD on dialysis ([place]) 02/10/2017  . Hyperlipidemia   . Hypertension   . Myocardial infarct ([place])   . Stroke [facility])    2-3 yrs ago- right sided weakness    Past Surgical History:  Procedure Laterality Date  . APPENDECTOMY    . AV FISTULA PLACEMENT Right 09/20/2015   Procedure: PLACEMENT OF RIGHT UPPER EXTREMITY ARTERIOVENOUS GORE-TEX GRAFT FOR HEMODIALYSIS ACCESS;  Surgeon: Vickie Epley, MD;  Location: [facility];  Service: Vascular;  Laterality: Right;  . CATARACT EXTRACTION W/PHACO Left 06/29/2013   Procedure: CATARACT EXTRACTION PHACO AND INTRAOCULAR LENS PLACEMENT (IOC);  Surgeon: Tonny Branch, MD;  Location: [facility];  Service: Ophthalmology;  Laterality: Left;  CDE 12.25  . CATARACT EXTRACTION W/PHACO Right 07/27/2013   Procedure: CATARACT EXTRACTION PHACO AND INTRAOCULAR LENS PLACEMENT (IOC);  Surgeon: Tonny Branch, MD;  Location: [facility];  Service: Ophthalmology;  Laterality: Right;  CDE:7.72  . COLONOSCOPY WITH PROPOFOL N/A 08/07/2018   Procedure: COLONOSCOPY WITH PROPOFOL;  Surgeon: Ronnette Juniper, MD;  Location: [place];  Service: Gastroenterology;  Laterality: N/A;  . ESOPHAGOGASTRODUODENOSCOPY N/A 08/03/2018   Procedure: ESOPHAGOGASTRODUODENOSCOPY (EGD);  Surgeon: Clarene Essex, MD;  Location: [place];  Service: Endoscopy;  Laterality: N/A;  . ESOPHAGOGASTRODUODENOSCOPY (EGD) WITH PROPOFOL N/A 08/07/2018   Procedure: ESOPHAGOGASTRODUODENOSCOPY (EGD) WITH PROPOFOL;  Surgeon: Ronnette Juniper, MD;  Location: [place];  Service: Gastroenterology;  Laterality: N/A;  . GIVENS CAPSULE STUDY N/A 08/07/2018   Procedure: GIVENS CAPSULE STUDY;  Surgeon: Ronnette Juniper, MD;  Location: [place];  Service: Gastroenterology;  Laterality: N/A;  . HEMOSTASIS CLIP PLACEMENT  08/07/2018   Procedure: HEMOSTASIS CLIP PLACEMENT;  Surgeon: Ronnette Juniper, MD;  [facility];  Service: Gastroenterology;;  . HOT HEMOSTASIS N/A 08/03/2018   Procedure: HOT HEMOSTASIS (ARGON PLASMA COAGULATION/BICAP);  Surgeon: Clarene Essex, MD;  Location: [place];  Service: Endoscopy;  Laterality: N/A;  . INSERTION OF DIALYSIS CATHETER Right 09/11/2015   Procedure: INSERTION OF  TUNNELED DIALYSIS CATHETER;  Surgeon: Vickie Epley, MD;  Location: [facility];  Service: Vascular;  Laterality: Right;  . INTRAMEDULLARY (IM) NAIL INTERTROCHANTERIC Right 07/20/2012   Procedure: INTRAMEDULLARY (IM) NAIL INTERTROCHANTRIC;  Surgeon: Carole Civil, MD;  Location: [facility];  Service: Orthopedics;  Laterality: Right;  . IR AV DIALY SHUNT INTRO NEEDLE/INTRAC INITIAL W/PTA/STENT/IMG RT Right 07/20/2018  . IR GENERIC HISTORICAL  09/25/2015   IR US GUIDE VASC ACCESS RIGHT 09/25/2015 Sandi Mariscal, MD [facility]  . IR GENERIC HISTORICAL  09/25/2015   IR FLUORO GUIDE CV LINE RIGHT 09/25/2015 Sandi Mariscal, MD [facility]  . IR GENERIC HISTORICAL  09/25/2015   IR REMOVAL TUN CV CATH W/O FL 09/25/2015 Sandi Mariscal, MD [facility]  . IR THROMBECTOMY AV FISTULA W/THROMBOLYSIS/PTA INC/SHUNT/IMG RIGHT Right 09/06/2017  . IR THROMBECTOMY AV FISTULA W/THROMBOLYSIS/PTA INC/SHUNT/IMG RIGHT Right 09/29/2017  . IR THROMBECTOMY AV FISTULA W/THROMBOLYSIS/PTA INC/SHUNT/IMG RIGHT Right 07/20/2018  . IR US GUIDE VASC ACCESS RIGHT  09/06/2017  . IR US GUIDE VASC ACCESS RIGHT  09/29/2017  . IR US GUIDE VASC ACCESS RIGHT  07/20/2018  . IR US GUIDE VASC ACCESS RIGHT  07/20/2018  . SCLEROTHERAPY  08/03/2018   Procedure: Clide Deutscher;  Surgeon: Clarene Essex, MD;  Location: [place];  Service: Endoscopy;;     reports that she has never smoked. She has never used smokeless tobacco. She reports that she does not drink alcohol or use drugs.  Allergies  Allergen Reactions  . Grapefruit Flavor [Flavoring Agent]     Reaction unknown    Family History  Problem Relation Age of Onset  . Heart disease Mother   . Heart disease Father      Prior to Admission medications   Medication Sig Start Date End Date Taking? Authorizing Provider  Amino Acids-Protein Hydrolys (FEEDING SUPPLEMENT, PRO-STAT SUGAR FREE 64,) LIQD Take 30 mLs by mouth 3 (three) times daily with meals. 08/19/18  Yes Mikhail, Velta Addison, DO  aspirin EC 81 MG  tablet Take 81 mg by mouth daily.   Yes [provider]  atorvastatin (LIPITOR) 20 MG tablet Take 20 mg by mouth daily.   Yes [provider]  calcium-vitamin D (OSCAL WITH D) 500-200 MG-UNIT tablet Take 1 tablet by mouth.   Yes [provider]  cholecalciferol (VITAMIN D) 400 units TABS tablet Take 1,000 Units by mouth daily.    Yes [provider]  escitalopram (LEXAPRO) 5 MG tablet Take 5 mg by mouth daily.   Yes [provider]  loperamide (IMODIUM A-D) 2 MG tablet Take 2 mg by mouth every 6 (six) hours as needed for diarrhea or loose stools.   Yes [provider]  midodrine (PROAMATINE) 10 MG tablet Take 1 tablet (10 mg total) by mouth 3 (three) times daily with meals. 08/19/18  Yes Mikhail, [place], DO  multivitamin (RENA-VIT) TABS tablet Take 1 tablet by mouth at bedtime. 08/19/18  Yes Mikhail, Maryann, DO  Pancrelipase, Lip-Prot-Amyl, (CREON) 6000 units CPEP Take 1 capsule by mouth 3 (three) times daily with meals. 09/27/17  Yes [provider]  pantoprazole (PROTONIX) 40 MG tablet Take 1 tablet (40 mg total) by mouth 2 (two) times daily. 08/19/18  Yes Mikhail, Velta Addison, DO  Physical Exam: Vitals:   09/01/18 1745 09/01/18 1815 09/01/18 1900 09/01/18 1915  BP: (!) 126/59 (!) 130/54 (!) 121/59 97/79  Pulse: 65     Resp: 18 17 17 15   Temp:      TempSrc:      SpO2: 100%       Constitutional: Chronically ill appearing, very sleepy. Eyes: PERRL, lids and conjunctivae normal ENMT: Mucous membranes are moist. Posterior pharynx clear of any exudate or lesions.Normal dentition.  Neck: normal, supple, no masses, no thyromegaly Respiratory: Diminished on L side, absent at base, no tachypnea nor increased WOB, no accessory muscle use. Cardiovascular: Regular rate and rhythm, no murmurs / rubs / gallops. No extremity edema. 2+ pedal pulses. No carotid bruits.  Abdomen: no tenderness, no masses palpated. No hepatosplenomegaly. Bowel  sounds positive.  Musculoskeletal: no clubbing / cyanosis. No joint deformity upper and lower extremities. Good ROM, no contractures. Normal muscle tone.  Skin: no rashes, lesions, ulcers. No induration Neurologic: CN 2-12 grossly intact. Sensation intact, DTR normal. Strength 5/5 in all 4.  Psychiatric: Normal judgment and insight. Alert and oriented x 3. Normal mood.    Labs on Admission: I have personally reviewed following labs and imaging studies  CBC: Recent Labs  Lab 09/01/18 1531 09/01/18 1941  WBC 5.4  --   HGB 12.4 12.9  HCT 42.0 38.0  MCV 93.8  --   PLT 285  --    Basic Metabolic Panel: Recent Labs  Lab 09/01/18 1531 09/01/18 1620 09/01/18 1941  NA 134*  --  133*  K 4.6  --  4.1  CL 100  --   --   CO2 25  --   --   GLUCOSE 68*  --   --   BUN 24*  --   --   CREATININE 4.59*  --   --   CALCIUM 8.0*  --   --   MG  --  2.2  --   PHOS  --  3.1  --    GFR: Estimated Creatinine Clearance: 5.3 mL/min (A) (by C-G formula based on SCr of 4.59 mg/dL (H)). Liver Function Tests: Recent Labs  Lab 09/01/18 1620  AST 27  ALT 13  ALKPHOS 76  BILITOT 0.7  PROT 5.3*  ALBUMIN 1.8*   No results for input(s): LIPASE, AMYLASE in the last 168 hours. No results for input(s): AMMONIA in the last 168 hours. Coagulation Profile: No results for input(s): INR, PROTIME in the last 168 hours. Cardiac Enzymes: No results for input(s): CKTOTAL, CKMB, CKMBINDEX, TROPONINI in the last 168 hours. BNP (last 3 results) No results for input(s): PROBNP in the last 8760 hours. HbA1C: No results for input(s): HGBA1C in the last 72 hours. CBG: Recent Labs  Lab 09/01/18 1953  GLUCAP 111*   Lipid Profile: No results for input(s): CHOL, HDL, LDLCALC, TRIG, CHOLHDL, LDLDIRECT in the last 72 hours. Thyroid Function Tests: No results for input(s): TSH, T4TOTAL, FREET4, T3FREE, THYROIDAB in the last 72 hours. Anemia Panel: No results for input(s): VITAMINB12, FOLATE, FERRITIN, TIBC,  IRON, RETICCTPCT in the last 72 hours. Urine analysis:    Component Value Date/Time   COLORURINE YELLOW 07/17/2012 1751   APPEARANCEUR CLOUDY (A) 07/17/2012 1751   LABSPEC 1.020 07/17/2012 1751   PHURINE 5.5 07/17/2012 1751   GLUCOSEU NEGATIVE 07/17/2012 1751   HGBUR SMALL (A) 07/17/2012 1751   BILIRUBINUR NEGATIVE 07/17/2012 1751   KETONESUR NEGATIVE 07/17/2012 1751   PROTEINUR 30 (A) 07/17/2012 1751   UROBILINOGEN  0.2 07/17/2012 1751   NITRITE NEGATIVE 07/17/2012 1751   LEUKOCYTESUR SMALL (A) 07/17/2012 1751    Radiological Exams on Admission: Dg Chest 2 View  Result Date: 09/01/2018 CLINICAL DATA:  Acute shortness of breath EXAM: CHEST - 2 VIEW COMPARISON:  08/14/2018 FINDINGS: A large LEFT pleural effusion has slightly increased. RIGHT pleural effusion again noted. Bilateral LOWER lung atelectasis identified. Pulmonary vascular congestion is present. Cardiomediastinal silhouette does not appear significantly changed. No pneumothorax or acute bony abnormality. RIGHT axillary vascular graft again noted. IMPRESSION: Slightly increased large LEFT pleural effusion. No definite change in RIGHT pleural effusion or bilateral LOWER lung atelectasis. Pulmonary vascular congestion. Electronically Signed   By: Margarette Canada M.D.   On: 09/01/2018 16:11    EKG: Independently reviewed.  Assessment/Plan Principal Problem:   Recurrent pleural effusion on left Active Problems:   ESRD on dialysis ([place])   Type 2 diabetes mellitus (H[facility])   Pressure injury of skin   Protein-calorie malnutrition, severe   Chronic diastolic CHF (congestive heart failure) ([place])   History of stroke    1. Recurrent pleural effusion on left - 1. "Large", but only "slightly increased" in size since 6/21 CXR. 2. Sent in from dialysis center because they felt she was SOB and wouldn't do dialysis today. 3. On my evaluation: 1. Patient denies SOB subjectively 2. Objectively doesn't appear to have accessory muscle use, no  increased WOB, no tachypnea, resting comfortably with respirations of 15 3. Satting 98-100% on RA 4. No evidence of CO2 retention by ABG 4. Patient does have recurrent L pleural effusion, however: 1. Will reoccur again after drainage due to very low albumin 2. When thoracentesis was performed last month, patient suffered iatrogenic pneumothorax as complication of procedure and had to have chest tube.  Further thoracentesis were avoided during the admission as a result. 5. On the other hand, if dialysis center wont take her, dont really have much option but to admit to hospital. 6. Will hold off on ordering thoracentesis for now and let my day team discuss this with nephrology when they get consulted to see her in AM. 2. ESRD - 1. No emergent dialysis needs but does need routine dialysis 2. Call nephro in AM, discuss above 3. DM2 - 1. Off all DM meds for some time now 2. Is actually hypoglycemic at times secondary to very poor PO intake 3. Getting half amp of D50 for BGL 68 in ED 4. CBG checks q4h 4. Malnutrition - 1. patient with extremely poor PO intake for quite some time now 2. Especially poor over last couple of days it seems per HPI 3. Dietary consult 4. Continue feeding supplement 5. Family does not want feeding PEG tube placed (I agreed with them on this point). 5. General debility - 1. Family has declined SNF 2. Do not feeding tube, but still full code otherwise at the moment 3. Will re-consult [facility] care 6. Excessive lethargy 1. Getting CT head 2. ammonia level 3. TSH 4. Cant get UDS as no urine, but not on any benzos nor opiates 5. CO2 nl 6. No SIRS to suggest infection 7. No other localizing symptoms (pain) to suggest something else. 7. Chronic diastolic CHF - 1. managed through dialysis 2. Cont home midodrine for hypotension  DVT prophylaxis: SCDs - due to GIB last month Code Status: Full Code for now Family Communication: Son at bedside Disposition Plan: Home after  admit Consults called: None, call nephro in AM Admission status: Place in [place]  GARDNER, JARED Jerilynn Mages DO [facility]  How to contact the [facility] Attending or Consulting provider [place] or covering provider during after hours [place], for this patient?  1. Check the care team in [facility] and look for a) attending/consulting [facility] provider listed and b) the [facility] team listed 2. Log into [email or web address]  Amion Physician Scheduling and messaging for groups and whole hospitals  On call and physician scheduling software for group practices, residents, hospitalists and other medical providers for call, clinic, rotation and shift schedules. On[employer] is a hospital-wide system for scheduling doctors and paging doctors on call. EasyPlot is for scientific plotting and data analysis.  [email or web address]  and use [place]'s universal password to access. If you do not have the password, please contact the hospital operator.  3. Locate the [facility] provider you are looking for under [facility] and page to a number that you can be directly reached. 4. If you still have difficulty reaching the provider, please page the [facility] (Director on Call) for the Hospitalists listed on amion for assistance.  09/01/2018, 8:12 PM

## 2018-09-01 NOTE — ED Provider Notes (Signed)
[facility] EMERGENCY DEPARTMENT Provider Note   CSN: [id number] Arrival date & time: 09/01/18  1421    History   Chief Complaint Chief Complaint  Patient presents with  . Shortness of Breath    HPI Courtney [name] is a 78 y.o. female.     The history is provided by a relative. No language interpreter was used.  Illness Location:  Generalized  Quality:  SOB noted at dialysis, sent to ED Severity:  Moderate Onset quality:  Gradual Timing:  Constant Progression:  Worsening Chronicity:  Recurrent Associated symptoms: fatigue and shortness of breath   Associated symptoms: no chest pain, no cough, no fever, no headaches, no nausea, no rhinorrhea and no vomiting      78 year old female patient with past medical history of CHF, CAD, diabetes, ESRD on T/Th/S hemodialysis and prior CVA who presents to the emergency department today for evaluation of shortness of breath. She arrived to dialysis clinic today and the center sent her to the ED out of concern for her increased work of breathing and near absent lung sounds on the left.  She is wheelchair-bound at baseline.  Patient's adult daughter Courtney Citizen, RN [phone]) provided additional history; and states that for the last two weeks since the patient was discharged home from the hospital the patient has become more short-of-breath by watching her despite the patient not complaining of SOB. Pt has not been eating or drinking much for the last four days. She seems much more tired to her family and is sleeping more than usual.   Delmar comes out to the home once / week.   Past Medical History:  Diagnosis Date  . Anemia 07/2018  . CHF (congestive heart failure) ([place])   . Coronary artery disease   . Diabetes mellitus without complication ([place])   . ESRD on dialysis ([place]) 02/10/2017  . Hyperlipidemia   . Hypertension   . Myocardial infarct ([place])   . Stroke [facility])    2-3 yrs ago- right sided weakness     Patient Active Problem List   Diagnosis Date Noted  . Goals of care, counseling/discussion   . Palliative care by specialist   . Protein-calorie malnutrition, severe 08/09/2018  . Hypotensive episode 08/07/2018  . GI bleed 08/07/2018  . Shock circulatory ([place])   . S/P thoracentesis   . Pneumothorax on left   . Pressure injury of skin 08/04/2018  . Symptomatic anemia 07/30/2018  . Acute CVA (cerebrovascular accident) ([place]) 07/13/2017  . Hypokalemia 07/13/2017  . Anemia in ESRD (end-stage renal disease) ([place]) 07/13/2017  . Hyperlipidemia 07/13/2017  . Coronary artery disease 07/13/2017  . Type 2 diabetes mellitus ([place]) 07/13/2017  . ESRD on dialysis ([place]) 02/10/2017  . Intertrochanteric fracture of right hip ([place]) 08/16/2012  . Syncope 07/17/2012  . Closed right hip fracture ([place]) 07/17/2012  . HTN (hypertension) 07/17/2012  . DM (diabetes mellitus) ([place]) 07/17/2012  . Cerebrovascular disease 07/17/2012    Past Surgical History:  Procedure Laterality Date  . APPENDECTOMY    . AV FISTULA PLACEMENT Right 09/20/2015   Procedure: PLACEMENT OF RIGHT UPPER EXTREMITY ARTERIOVENOUS GORE-TEX GRAFT FOR HEMODIALYSIS ACCESS;  Surgeon: Vickie Epley, MD;  Location: [facility];  Service: Vascular;  Laterality: Right;  . CATARACT EXTRACTION W/PHACO Left 06/29/2013   Procedure: CATARACT EXTRACTION PHACO AND INTRAOCULAR LENS PLACEMENT (IOC);  Surgeon: Tonny Branch, MD;  Location: [facility];  Service: Ophthalmology;  Laterality: Left;  CDE 12.25  . CATARACT EXTRACTION W/PHACO Right 07/27/2013  Procedure: CATARACT EXTRACTION PHACO AND INTRAOCULAR LENS PLACEMENT (IOC);  Surgeon: Tonny Branch, MD;  Location: [facility];  Service: Ophthalmology;  Laterality: Right;  CDE:7.72  . COLONOSCOPY WITH PROPOFOL N/A 08/07/2018   Procedure: COLONOSCOPY WITH PROPOFOL;  Surgeon: Ronnette Juniper, MD;  Location: [facility];  Service: Gastroenterology;  Laterality: N/A;  . ESOPHAGOGASTRODUODENOSCOPY N/A 08/03/2018   Procedure:  ESOPHAGOGASTRODUODENOSCOPY (EGD);  Surgeon: Clarene Essex, MD;  Location: [facility];  Service: Endoscopy;  Laterality: N/A;  . ESOPHAGOGASTRODUODENOSCOPY (EGD) WITH PROPOFOL N/A 08/07/2018   Procedure: ESOPHAGOGASTRODUODENOSCOPY (EGD) WITH PROPOFOL;  Surgeon: Ronnette Juniper, MD;  Location: [facility];  Service: Gastroenterology;  Laterality: N/A;  . GIVENS CAPSULE STUDY N/A 08/07/2018   Procedure: GIVENS CAPSULE STUDY;  Surgeon: Ronnette Juniper, MD;  Location: [facility];  Service: Gastroenterology;  Laterality: N/A;  . HEMOSTASIS CLIP PLACEMENT  08/07/2018   Procedure: HEMOSTASIS CLIP PLACEMENT;  Surgeon: Ronnette Juniper, MD;  Location: [facility];  Service: Gastroenterology;;  . HOT HEMOSTASIS N/A 08/03/2018   Procedure: HOT HEMOSTASIS (ARGON PLASMA COAGULATION/BICAP);  Surgeon: Clarene Essex, MD;  Location: [facility];  Service: Endoscopy;  Laterality: N/A;  . INSERTION OF DIALYSIS CATHETER Right 09/11/2015   Procedure: INSERTION OF TUNNELED DIALYSIS CATHETER;  Surgeon: Vickie Epley, MD;  [facility];  Service: Vascular;  Laterality: Right;  . INTRAMEDULLARY (IM) NAIL INTERTROCHANTERIC Right 07/20/2012   Procedure: INTRAMEDULLARY (IM) NAIL INTERTROCHANTRIC;  Surgeon: Carole Civil, MD;  Location: [facility];  Service: Orthopedics;  Laterality: Right;  . IR AV DIALY SHUNT INTRO NEEDLE/INTRAC INITIAL W/PTA/STENT/IMG RT Right 07/20/2018  . IR GENERIC HISTORICAL  09/25/2015   IR US GUIDE VASC ACCESS RIGHT 09/25/2015 Sandi Mariscal, MD [facility]  . IR GENERIC HISTORICAL  09/25/2015   IR FLUORO GUIDE CV LINE RIGHT 09/25/2015 Sandi Mariscal, MD [facility]  . IR GENERIC HISTORICAL  09/25/2015   IR REMOVAL TUN CV CATH W/O FL 09/25/2015 Sandi Mariscal, MD [facility]  . IR THROMBECTOMY AV FISTULA W/THROMBOLYSIS/PTA INC/SHUNT/IMG RIGHT Right 09/06/2017  . IR THROMBECTOMY AV FISTULA W/THROMBOLYSIS/PTA INC/SHUNT/IMG RIGHT Right 09/29/2017  . IR THROMBECTOMY AV FISTULA W/THROMBOLYSIS/PTA INC/SHUNT/IMG RIGHT Right 07/20/2018   . IR US GUIDE VASC ACCESS RIGHT  09/06/2017  . IR US GUIDE VASC ACCESS RIGHT  09/29/2017  . IR US GUIDE VASC ACCESS RIGHT  07/20/2018  . IR US GUIDE VASC ACCESS RIGHT  07/20/2018  . SCLEROTHERAPY  08/03/2018   Procedure: Clide Deutscher;  Surgeon: Clarene Essex, MD;  Location: [facility] ENDOSCOPY;  Service: Endoscopy;;     OB History   No obstetric history on file.      Home Medications    Prior to Admission medications   Medication Sig Start Date End Date Taking? Authorizing Provider  Amino Acids-Protein Hydrolys (FEEDING SUPPLEMENT, PRO-STAT SUGAR FREE 64,) LIQD Take 30 mLs by mouth 3 (three) times daily with meals. 08/19/18   Cristal Ford, DO  aspirin EC 81 MG tablet Take 81 mg by mouth daily.    [provider]  atorvastatin (LIPITOR) 20 MG tablet Take 20 mg by mouth daily.    [provider]  calcium-vitamin D (OSCAL WITH D) 500-200 MG-UNIT tablet Take 1 tablet by mouth.    [provider]  cholecalciferol (VITAMIN D) 400 units TABS tablet Take 1,000 Units by mouth daily.     [provider]  escitalopram (LEXAPRO) 5 MG tablet Take 5 mg by mouth daily.    [provider]  loperamide (IMODIUM A-D) 2 MG tablet Take 2 mg by mouth  every 6 (six) hours as needed for diarrhea or loose stools.    [provider]  midodrine (PROAMATINE) 10 MG tablet Take 1 tablet (10 mg total) by mouth 3 (three) times daily with meals. 08/19/18   Cristal Ford, DO  multivitamin (RENA-VIT) TABS tablet Take 1 tablet by mouth at bedtime. 08/19/18   Mikhail, Velta Addison, DO  oxyCODONE-acetaminophen (ROXICET) 5-325 MG tablet Take 1 tablet by mouth every 6 (six) hours as needed for severe pain. Patient not taking: Reported on 07/30/2018 09/20/15   Vickie Epley, MD  Pancrelipase, Lip-Prot-Amyl, (CREON) 6000 units CPEP Take 1 capsule by mouth 3 (three) times daily with meals. 09/27/17   [provider]  pantoprazole (PROTONIX) 40 MG tablet Take 1 tablet (40 mg total)  by mouth 2 (two) times daily. 08/19/18   Cristal Ford, DO    Family History Family History  Problem Relation Age of Onset  . Heart disease Mother   . Heart disease Father     Social History Social History   Tobacco Use  . Smoking status: Never Smoker  . Smokeless tobacco: Never Used  Substance Use Topics  . Alcohol use: No  . Drug use: No     Allergies   Grapefruit flavor [flavoring agent]   Review of Systems Review of Systems  Constitutional: Positive for activity change, appetite change and fatigue. Negative for chills and fever.  HENT: Negative for postnasal drip, rhinorrhea and sinus pain.   Respiratory: Positive for shortness of breath. Negative for cough and choking.   Cardiovascular: Negative for chest pain and palpitations.  Gastrointestinal: Negative for blood in stool, nausea and vomiting.  Genitourinary: Negative.   Musculoskeletal: Negative.   Neurological: Positive for weakness. Negative for light-headedness and headaches.     Physical Exam Updated Vital Signs BP (!) 130/54   Pulse 65   Temp 98.1 F (36.7 C) (Oral)   Resp 17   SpO2 100%   Physical Exam Vitals signs and nursing note reviewed.  Constitutional:      General: She is not in acute distress.    Appearance: She is underweight.     Comments: Chronically ill appearing  HENT:     Head: Normocephalic and atraumatic.     Right Ear: External ear normal.     Left Ear: External ear normal.     Nose: Nose normal.  Eyes:     Conjunctiva/sclera: Conjunctivae normal.  Neck:     Musculoskeletal: Neck supple.  Cardiovascular:     Rate and Rhythm: Normal rate and regular rhythm.     Pulses: Normal pulses.     Heart sounds: No murmur.  Pulmonary:     Effort: Bradypnea present. No accessory muscle usage or respiratory distress.     Breath sounds: Decreased air movement present. Examination of the left-upper field reveals decreased breath sounds. Examination of the left-middle field reveals  decreased breath sounds. Examination of the right-lower field reveals decreased breath sounds. Examination of the left-lower field reveals decreased breath sounds. Decreased breath sounds present.  Abdominal:     Palpations: Abdomen is soft.     Tenderness: There is no abdominal tenderness.  Musculoskeletal:        General: No deformity.  Skin:    General: Skin is warm and dry.  Neurological:     General: No focal deficit present.     Mental Status: She is alert.     Motor: Weakness present.      ED Treatments / Results  Labs (  all labs ordered are listed, but only abnormal results are displayed) Labs Reviewed  BASIC METABOLIC PANEL - Abnormal; Notable for the following components:      Result Value   Sodium 134 (*)    Glucose, Bld 68 (*)    BUN 24 (*)    Creatinine, Ser 4.59 (*)    Calcium 8.0 (*)    GFR calc non Af Amer 9 (*)    GFR calc Af Amer 10 (*)    All other components within normal limits  CBC - Abnormal; Notable for the following components:   MCHC 29.5 (*)    RDW 16.7 (*)    All other components within normal limits  HEPATIC FUNCTION PANEL - Abnormal; Notable for the following components:   Total Protein 5.3 (*)    Albumin 1.8 (*)    All other components within normal limits  LACTATE DEHYDROGENASE - Abnormal; Notable for the following components:   LDH 231 (*)    All other components within normal limits  BRAIN NATRIURETIC PEPTIDE - Abnormal; Notable for the following components:   B Natriuretic Peptide >4,500.0 (*)    All other components within normal limits  MAGNESIUM  PHOSPHORUS  URINALYSIS, ROUTINE W REFLEX MICROSCOPIC    EKG EKG Interpretation  Date/Time:  Thursday September 01 2018 15:29:05 EDT Ventricular Rate:  70 PR Interval:  140 QRS Duration: 92 QT Interval:  408 QTC Calculation: 440 R Axis:   110 Text Interpretation:  Normal sinus rhythm Right axis deviation Incomplete right bundle branch block Right ventricular hypertrophy Anterior  infarct , age undetermined Abnormal ECG Confirmed by Gerlene Fee [phone]) on 09/01/2018 4:52:09 PM   Radiology Dg Chest 2 View  Result Date: 09/01/2018 CLINICAL DATA:  Acute shortness of breath EXAM: CHEST - 2 VIEW COMPARISON:  08/14/2018 FINDINGS: A large LEFT pleural effusion has slightly increased. RIGHT pleural effusion again noted. Bilateral LOWER lung atelectasis identified. Pulmonary vascular congestion is present. Cardiomediastinal silhouette does not appear significantly changed. No pneumothorax or acute bony abnormality. RIGHT axillary vascular graft again noted. IMPRESSION: Slightly increased large LEFT pleural effusion. No definite change in RIGHT pleural effusion or bilateral LOWER lung atelectasis. Pulmonary vascular congestion. Electronically Signed   By: Margarette Canada M.D.   On: 09/01/2018 16:11    Procedures Procedures (including critical care time)  Medications Ordered in ED Medications - No data to display   Initial Impression / Assessment and Plan / ED Course  I have reviewed the triage vital signs and the nursing notes.  Pertinent labs & imaging results that were available during my care of the patient were reviewed by me and considered in my medical decision making (see chart for details).   EM Physician interpretation of Labs & Imaging: . Normal CBC . BMP with mild hyperglycemia with glucose 68, BUN 24, creatinine 4.59 . Normal magnesium and phosphorus . Significantly elevated BNP greater than 4500 . Hepatic function panel with significant hypoalbuminemia with albumin 1.8  Medical Decision Making:  LILAH MIJANGOS is a 78 y.o. female with the above past medical history presents the emergency department today for evaluation of 4 days of progressive shortness of breath/increased work of breathing noted by her family who are her primary caretakers as well as failure to thrive.  Additional history provided above in the HPI, patient has been much less interested in oral intake  by food and liquid over the last 4 days and has generally become quite lethargic according to the family.  She presented  to dialysis clinic today and was noted to have an increased work of breathing with near absent breath sounds on the left side and was sent to the emergency department for further evaluation.  Laboratory imaging work-up in the emergency department reveals significant hypoalbuminemia and a large left recurrent pleural effusion.  Patient will be admitted to the hospitalist service for further inpatient care and management.  Family has been updated.    CLINICAL IMPRESSION: Shortness of breath Pleural effusion Failure to thrive ESRD Hypoalbuminemia  Disposition: Admit   The plan for this patient was discussed with my attending physician, Dr. Gerlene Fee, who voiced agreement and who oversaw evaluation and treatment of this patient.   Camila Norville A. Jimmye Norman, MD Resident Physician, PGY-3 Emergency Medicine [facility]   Final Clinical Impressions(s) / ED Diagnoses   Final diagnoses:  SOB (shortness of breath)  Pleural effusion  FTT (failure to thrive) in adult  ESRD (end stage renal disease) (HCC)      Jefm Petty, MD 09/03/18 0449    Maudie Flakes, MD 09/05/18 [phone]

## 2018-09-01 NOTE — Discharge Instructions (Addendum)
Chronic Kidney Disease, Adult °Chronic kidney disease (CKD) happens when the kidneys are damaged over a long period of time. The kidneys are two organs that help with: °· Getting rid of waste and extra fluid from the blood. °· Making hormones that maintain the amount of fluid in your tissues and blood vessels. °· Making sure that the body has the right amount of fluids and chemicals. °Most of the time, CKD does not go away, but it can usually be controlled. Steps must be taken to slow down the kidney damage or to stop it from getting worse. If this is not done, the kidneys may stop working. °Follow these instructions at home: °Medicines °· Take over-the-counter and prescription medicines only as told by your doctor. You may need to change the amount of medicines you take. °· Do not take any new medicines unless your doctor says it is okay. Many medicines can make your kidney damage worse. °· Do not take any vitamin and supplements unless your doctor says it is okay. Many vitamins and supplements can make your kidney damage worse. °General instructions °· Follow a diet as told by your doctor. You may need to stay away from: °? Alcohol. °? Salty foods. °? Foods that are high in: °§ Potassium. °§ Calcium. °§ Protein. °· Do not use any products that contain nicotine or tobacco, such as cigarettes and e-cigarettes. If you need help quitting, ask your doctor. °· Keep track of your blood pressure at home. Tell your doctor about any changes. °· If you have diabetes, keep track of your blood sugar as told by your doctor. °· Try to stay at a healthy weight. If you need help, ask your doctor. °· Exercise at least 30 minutes a day, 5 days a week. °· Stay up-to-date with your shots (immunizations) as told by your doctor. °· Keep all follow-up visits as told by your doctor. This is important. °Contact a doctor if: °· Your symptoms get worse. °· You have new symptoms. °Get help right away if: °· You have symptoms of end-stage  kidney disease. These may include: °? Headaches. °? Numbness in your hands or feet. °? Easy bruising. °? Having hiccups often. °? Chest pain. °? Shortness of breath. °? Stopping of menstrual periods in women. °· You have a fever. °· You have very little pee (urine). °· You have pain or bleeding when you pee. °Summary °· Chronic kidney disease (CKD) happens when the kidneys are damaged over a long period of time. °· Most of the time, this condition does not go away, but it can usually be controlled. Steps must be taken to slow down the kidney damage or to stop it from getting worse. °· Treatment may include a combination of medicines and lifestyle changes. °This information is not intended to replace advice given to you by your health care provider. Make sure you discuss any questions you have with your health care provider. °Document Released: 05/06/2009 Document Revised: 01/22/2017 Document Reviewed: 03/16/2016 °Elsevier Patient Education © 2020 [employer] ° °

## 2018-09-01 NOTE — ED Notes (Signed)
Attempted report 

## 2018-09-01 NOTE — ED Notes (Signed)
ED TO INPATIENT HANDOFF REPORT  ED Nurse Name and Phone #:  Courtney [name] [phone]  S Name/Age/Gender Courtney [name] 78 y.o. female Room/Bed: 016C/016C  Code Status   Code Status: Full Code  Home/SNF/Other Home Patient oriented to: self Is this baseline? Yes   Triage Complete: Triage complete  Chief Complaint sob  Triage Note To ED for eval of SOB. Pt is a dialysis pt - taken today by son and sent to hospital for eval of sob. Pts son wanted triage nurse to talk with daughter (nurse - primary caregiver for pt) who said pt was inpt until 6/26 when family wanted to bring her home due to her not eating well. Daughter concerned pt may have aspirated food. Pt very soft spoken- states she feels fine. Pt is wheelchair bound/bed bound.   Pts daughter Courtney [name]  [phone]   Allergies Allergies  Allergen Reactions  . Grapefruit Flavor [Flavoring Agent]     Reaction unknown    Level of Care/Admitting Diagnosis ED Disposition    ED Disposition Condition Comment   Admit  Hospital Area: [facility] [[id number]]  Level of Care: Med-Surg [16]  I expect the patient will be discharged within 24 hours: No (not a candidate for 5C-Observation unit)  Covid Evaluation: Asymptomatic Screening Protocol (No Symptoms)  Diagnosis: Recurrent pleural effusion on left [629528]  Admitting Physician: Etta Quill [[id number]]  Attending Physician: Etta Quill [[id number]]  PT Class (Do Not Modify): Observation [104]  PT Acc Code (Do Not Modify): Observation [10022]       B Medical/Surgery History Past Medical History:  Diagnosis Date  . Anemia 07/2018  . CHF (congestive heart failure) ([place])   . Coronary artery disease   . Diabetes mellitus without complication ([place])   . ESRD on dialysis ([place]) 02/10/2017  . Hyperlipidemia   . Hypertension   . Myocardial infarct ([place])   . Stroke [facility])    2-3 yrs ago- right sided weakness   Past Surgical History:  Procedure Laterality Date  .  APPENDECTOMY    . AV FISTULA PLACEMENT Right 09/20/2015   Procedure: PLACEMENT OF RIGHT UPPER EXTREMITY ARTERIOVENOUS GORE-TEX GRAFT FOR HEMODIALYSIS ACCESS;  Surgeon: Vickie Epley, MD;  Location: [facility];  Service: Vascular;  Laterality: Right;  . CATARACT EXTRACTION W/PHACO Left 06/29/2013   Procedure: CATARACT EXTRACTION PHACO AND INTRAOCULAR LENS PLACEMENT (IOC);  Surgeon: Tonny Branch, MD;  Location: [facility];  Service: Ophthalmology;  Laterality: Left;  CDE 12.25  . CATARACT EXTRACTION W/PHACO Right 07/27/2013   Procedure: CATARACT EXTRACTION PHACO AND INTRAOCULAR LENS PLACEMENT (IOC);  Surgeon: Tonny Branch, MD;  Location: [facility];  Service: Ophthalmology;  Laterality: Right;  CDE:7.72  . COLONOSCOPY WITH PROPOFOL N/A 08/07/2018   Procedure: COLONOSCOPY WITH PROPOFOL;  Surgeon: Ronnette Juniper, MD;  Location: [facility];  Service: Gastroenterology;  Laterality: N/A;  . ESOPHAGOGASTRODUODENOSCOPY N/A 08/03/2018   Procedure: ESOPHAGOGASTRODUODENOSCOPY (EGD);  Surgeon: Clarene Essex, MD;  Location: [facility];  Service: Endoscopy;  Laterality: N/A;  . ESOPHAGOGASTRODUODENOSCOPY (EGD) WITH PROPOFOL N/A 08/07/2018   Procedure: ESOPHAGOGASTRODUODENOSCOPY (EGD) WITH PROPOFOL;  Surgeon: Ronnette Juniper, MD;  Location: [facility];  Service: Gastroenterology;  Laterality: N/A;  . GIVENS CAPSULE STUDY N/A 08/07/2018   Procedure: GIVENS CAPSULE STUDY;  Surgeon: Ronnette Juniper, MD;  Location: [facility];  Service: Gastroenterology;  Laterality: N/A;  . HEMOSTASIS CLIP PLACEMENT  08/07/2018   Procedure: HEMOSTASIS CLIP PLACEMENT;  Surgeon: Ronnette Juniper, MD;  Location: [facility];  Service: Gastroenterology;;  . HOT  HEMOSTASIS N/A 08/03/2018   Procedure: HOT HEMOSTASIS (ARGON PLASMA COAGULATION/BICAP);  Surgeon: Clarene Essex, MD;  Location: [facility];  Service: Endoscopy;  Laterality: N/A;  . INSERTION OF DIALYSIS CATHETER Right 09/11/2015   Procedure: INSERTION OF TUNNELED DIALYSIS CATHETER;  Surgeon: Vickie Epley,  MD;  [facility];  Service: Vascular;  Laterality: Right;  . INTRAMEDULLARY (IM) NAIL INTERTROCHANTERIC Right 07/20/2012   Procedure: INTRAMEDULLARY (IM) NAIL INTERTROCHANTRIC;  Surgeon: Carole Civil, MD;  Location: [facility];  Service: Orthopedics;  Laterality: Right;  . IR AV DIALY SHUNT INTRO NEEDLE/INTRAC INITIAL W/PTA/STENT/IMG RT Right 07/20/2018  . IR GENERIC HISTORICAL  09/25/2015   IR US GUIDE VASC ACCESS RIGHT 09/25/2015 Sandi Mariscal, MD [facility]  . IR GENERIC HISTORICAL  09/25/2015   IR FLUORO GUIDE CV LINE RIGHT 09/25/2015 Sandi Mariscal, MD [facility]  . IR GENERIC HISTORICAL  09/25/2015   IR REMOVAL TUN CV CATH W/O FL 09/25/2015 Sandi Mariscal, MD [facility]  . IR THROMBECTOMY AV FISTULA W/THROMBOLYSIS/PTA INC/SHUNT/IMG RIGHT Right 09/06/2017  . IR THROMBECTOMY AV FISTULA W/THROMBOLYSIS/PTA INC/SHUNT/IMG RIGHT Right 09/29/2017  . IR THROMBECTOMY AV FISTULA W/THROMBOLYSIS/PTA INC/SHUNT/IMG RIGHT Right 07/20/2018  . IR US GUIDE VASC ACCESS RIGHT  09/06/2017  . IR US GUIDE VASC ACCESS RIGHT  09/29/2017  . IR US GUIDE VASC ACCESS RIGHT  07/20/2018  . IR US GUIDE VASC ACCESS RIGHT  07/20/2018  . SCLEROTHERAPY  08/03/2018   Procedure: SCLEROTHERAPY;  Surgeon: Clarene Essex, MD;  Location: [facility] END[facility]COPY;  Service: Endoscopy;;     A IV Location/Drains/Wounds Patient Lines/Drains/Airways Status   Active Line/Drains/Airways    Name:   Placement date:   Placement time:   Site:   Days:   Peripheral IV 07/13/17 Left;Medial Forearm   07/13/17    1728    Forearm   415   Peripheral IV 09/01/18 Left;Upper;Medial Arm   09/01/18    1842    Arm   less than 1   Fistula / Graft Right Upper arm Arteriovenous vein graft   -    -    Upper arm      Fistula / Graft Right Upper arm   -    -    Upper arm      Hemodialysis Catheter Right Internal jugular Double-lumen;Permanent   09/25/15    1312    Internal jugular   1072   External Urinary Catheter   09/08/17    1433    -   358   Incision 07/20/12 Hip Right    07/20/12    1502     2234   Incision (Closed) 06/29/13 Eye Left   06/29/13    0840     1890   Incision (Closed) 07/27/13 Eye Right   07/27/13    0820     1862   Incision (Closed) 09/11/15 Chest Right   09/11/15    1114     1086   Incision (Closed) 09/20/15 Arm Right   09/20/15    1446     1077   Pressure Injury 08/02/18 Sacrum Mid Stage II -  Partial thickness loss of dermis presenting as a shallow open ulcer with a red, pink wound bed without slough.   08/02/18    0900     30   Pressure Injury 08/06/18 Buttocks Right;Left Stage I -  Intact skin with non-blanchable redness of a localized area usually over a bony prominence. less than approximate 1/2 inch pinkish red  areas right  and left buttock with    08/06/18    2000     26          Intake/Output Last 24 hours No intake or output data in the 24 hours ending 09/01/18 2120  Labs/Imaging Results for orders placed or performed during the hospital encounter of 09/01/18 (from the past 48 hour(s))  Basic metabolic panel     Status: Abnormal   Collection Time: 09/01/18  3:31 PM  Result Value Ref Range   Sodium 134 (L) 135 - 145 mmol/L   Potassium 4.6 3.5 - 5.1 mmol/L   Chloride 100 98 - 111 mmol/L   CO2 25 22 - 32 mmol/L   Glucose, Bld 68 (L) 70 - 99 mg/dL   BUN 24 (H) 8 - 23 mg/dL   Creatinine, Ser 4.59 (H) 0.44 - 1.00 mg/dL   Calcium 8.0 (L) 8.9 - 10.3 mg/dL   GFR calc non Af Amer 9 (L) >60 mL/min   GFR calc Af Amer 10 (L) >60 mL/min   Anion gap 9 5 - 15    Comment: Performed at [facility], [address] [place], [place] [address]  CBC     Status: Abnormal   Collection Time: 09/01/18  3:31 PM  Result Value Ref Range   WBC 5.4 4.0 - 10.5 K/uL   RBC 4.48 3.87 - 5.11 MIL/uL   Hemoglobin 12.4 12.0 - 15.0 g/dL   HCT 42.0 36.0 - 46.0 %   MCV 93.8 80.0 - 100.0 fL   MCH 27.7 26.0 - 34.0 pg   MCHC 29.5 (L) 30.0 - 36.0 g/dL   RDW 16.7 (H) 11.5 - 15.5 %   Platelets 285 150 - 400 K/uL   nRBC 0.0 0.0 - 0.2 %    Comment:  Performed at [facility] [address], [place], [place] [address]  Hepatic function panel     Status: Abnormal   Collection Time: 09/01/18  4:20 PM  Result Value Ref Range   Total Protein 5.3 (L) 6.5 - 8.1 g/dL   Albumin 1.8 (L) 3.5 - 5.0 g/dL   AST 27 15 - 41 U/L   ALT 13 0 - 44 U/L   Alkaline Phosphatase 76 38 - 126 U/L   Total Bilirubin 0.7 0.3 - 1.2 mg/dL   Bilirubin, Direct 0.2 0.0 - 0.2 mg/dL   Indirect Bilirubin 0.5 0.3 - 0.9 mg/dL    Comment: Performed at [facility] [address] [place], [place] [address]  Lactate dehydrogenase     Status: Abnormal   Collection Time: 09/01/18  4:20 PM  Result Value Ref Range   LDH 231 (H) 98 - 192 U/L    Comment: Performed at [facility], [place] [address] [place], [place] [address]  Magnesium     Status: None   Collection Time: 09/01/18  4:20 PM  Result Value Ref Range   Magnesium 2.2 1.7 - 2.4 mg/dL    Comment: Performed at [facility], [place] [address] [place], [place] [address]  Phosphorus     Status: None   Collection Time: 09/01/18  4:20 PM  Result Value Ref Range   Phosphorus 3.1 2.5 - 4.6 mg/dL    Comment: Performed at [facility] [address] [place], [place] [address]  Brain natriuretic peptide     Status: Abnormal   Collection Time: 09/01/18  4:20 PM  Result Value Ref Range   B Natriuretic  Peptide >4,500.0 (H) 0.0 - 100.0 pg/mL    Comment: Performed at [facility], [place] [address], [place], [place] [address]  Urinalysis, Routine w reflex microscopic     Status: Abnormal   Collection Time: 09/01/18  6:57 PM  Result Value Ref Range   Color, Urine YELLOW YELLOW   APPearance CLEAR CLEAR   Specific Gravity, Urine 1.012 1.005 - 1.030   pH 6.0 5.0 - 8.0   Glucose, UA NEGATIVE NEGATIVE mg/dL   Hgb urine dipstick NEGATIVE NEGATIVE   Bilirubin Urine NEGATIVE NEGATIVE   Ketones, ur NEGATIVE NEGATIVE mg/dL   Protein, ur 100 (A) NEGATIVE mg/dL   Nitrite NEGATIVE NEGATIVE    Leukocytes,Ua TRACE (A) NEGATIVE   RBC / HPF 0-5 0 - 5 RBC/hpf   WBC, UA 0-5 0 - 5 WBC/hpf   Bacteria, UA NONE SEEN NONE SEEN    Comment: Performed at [facility] [address] [place], [place] [address]  SARS Coronavirus 2 (CEPHEID- Performed in [facility] hospital lab), Hosp Order     Status: None   Collection Time: 09/01/18  7:40 PM   Specimen: Nasopharyngeal Swab  Result Value Ref Range   SARS Coronavirus 2 NEGATIVE NEGATIVE    Comment: (NOTE) If result is NEGATIVE SARS-CoV-2 target nucleic acids are NOT DETECTED. The SARS-CoV-2 RNA is generally detectable in upper and lower  respiratory specimens during the acute phase of infection. The lowest  concentration of SARS-CoV-2 viral copies this assay can detect is 250  copies / mL. A negative result does not preclude SARS-CoV-2 infection  and should not be used as the sole basis for treatment or other  patient management decisions.  A negative result may occur with  improper specimen collection / handling, submission of specimen other  than nasopharyngeal swab, presence of viral mutation(s) within the  areas targeted by this assay, and inadequate number of viral copies  (<250 copies / mL). A negative result must be combined with clinical  observations, patient history, and epidemiological information. If result is POSITIVE SARS-CoV-2 target nucleic acids are DETECTED. The SARS-CoV-2 RNA is generally detectable in upper and lower  respiratory specimens dur ing the acute phase of infection.  Positive  results are indicative of active infection with SARS-CoV-2.  Clinical  correlation with patient history and other diagnostic information is  necessary to determine patient infection status.  Positive results do  not rule out bacterial infection or co-infection with other viruses. If result is PRESUMPTIVE POSTIVE SARS-CoV-2 nucleic acids MAY BE PRESENT.   A presumptive positive result was obtained on the submitted specimen   and confirmed on repeat testing.  While 2019 novel coronavirus  (SARS-CoV-2) nucleic acids may be present in the submitted sample  additional confirmatory testing may be necessary for epidemiological  and / or clinical management purposes  to differentiate between  SARS-CoV-2 and other Sarbecovirus currently known to infect humans.  If clinically indicated additional testing with an alternate test  methodology [id number]) is advised. The SARS-CoV-2 RNA is generally  detectable in upper and lower respiratory sp ecimens during the acute  phase of infection. The expected result is Negative. Fact Sheet for Patients:  StrictlyIdeas.no Fact Sheet for Healthcare Providers: BankingDealers.co.za This test is not yet approved or cleared by the [place] FDA and has been authorized for detection and/or diagnosis of SARS-CoV-2 by FDA under an Emergency Use Authorization (EUA).  This EUA will remain in effect (meaning this test can be used) for the duration  of the COVID-19 declaration under Section 564(b)(1) of the Act, 21 U.S.C. section 360bbb-3(b)(1), unless the authorization is terminated or revoked sooner. Performed at [facility], [place] [address], [place], [place] [address]   I-STAT 7, (LYTES, BLD GAS, ICA, H+H)     Status: Abnormal   Collection Time: 09/01/18  7:41 PM  Result Value Ref Range   pH, Arterial 7.434 7.350 - 7.450   pCO2 arterial 42.4 32.0 - 48.0 mmHg   pO2, Arterial 72.0 (L) 83.0 - 108.0 mmHg   Bicarbonate 28.4 (H) 20.0 - 28.0 mmol/L   TCO2 30 22 - 32 mmol/L   O2 Saturation 95.0 %   Acid-Base Excess 4.0 (H) 0.0 - 2.0 mmol/L   Sodium 133 (L) 135 - 145 mmol/L   Potassium 4.1 3.5 - 5.1 mmol/L   Calcium, Ion 1.16 1.15 - 1.40 mmol/L   HCT 38.0 36.0 - 46.0 %   Hemoglobin 12.9 12.0 - 15.0 g/dL   Patient temperature HIDE    Collection site RADIAL, ALLEN'S TEST ACCEPTABLE    Drawn by RT    Sample type ARTERIAL   CBG  monitoring, ED     Status: Abnormal   Collection Time: 09/01/18  7:53 PM  Result Value Ref Range   Glucose-Capillary 111 (H) 70 - 99 mg/dL   Dg Chest 2 View  Result Date: 09/01/2018 CLINICAL DATA:  Acute shortness of breath EXAM: CHEST - 2 VIEW COMPARISON:  08/14/2018 FINDINGS: A large LEFT pleural effusion has slightly increased. RIGHT pleural effusion again noted. Bilateral LOWER lung atelectasis identified. Pulmonary vascular congestion is present. Cardiomediastinal silhouette does not appear significantly changed. No pneumothorax or acute bony abnormality. RIGHT axillary vascular graft again noted. IMPRESSION: Slightly increased large LEFT pleural effusion. No definite change in RIGHT pleural effusion or bilateral LOWER lung atelectasis. Pulmonary vascular congestion. Electronically Signed   By: Margarette Canada M.D.   On: 09/01/2018 16:11    Pending Labs Unresulted Labs (From admission, onward)    Start     Ordered   09/02/18 0500  CBC  Tomorrow morning,   R     09/01/18 1947   09/02/18 9163  Basic metabolic panel  Tomorrow morning,   R     09/01/18 1947   09/01/18 2037  TSH  Once,   STAT     09/01/18 2036   09/01/18 2033  Ammonia  Once,   STAT     09/01/18 2032   09/01/18 1925  Blood gas, arterial  Once,   R     09/01/18 1924          Vitals/Pain Today's Vitals   09/01/18 1745 09/01/18 1815 09/01/18 1900 09/01/18 1915  BP: (!) 126/59 (!) 130/54 (!) 121/59 97/79  Pulse: 65     Resp: 18 17 17 15   Temp:      TempSrc:      SpO2: 100%     PainSc:        Isolation Precautions Airborne and Contact precautions  Medications Medications  acetaminophen (TYLENOL) tablet 650 mg (has no administration in time range)    Or  acetaminophen (TYLENOL) suppository 650 mg (has no administration in time range)  ondansetron (ZOFRAN) tablet 4 mg (has no administration in time range)    Or  ondansetron (ZOFRAN) injection 4 mg (has no administration in time range)  atorvastatin (LIPITOR)  tablet 20 mg (has no administration in time range)  aspirin EC tablet 81 mg (has no administration in time range)  escitalopram (LEXAPRO) tablet 5 mg (has no administration in time range)  cholecalciferol (VITAMIN D3) tablet 1,000 Units (has no administration in time range)  calcium-vitamin D (OSCAL WITH D) 500-200 MG-UNIT per tablet 1 tablet (has no administration in time range)  midodrine (PROAMATINE) tablet 10 mg (has no administration in time range)  multivitamin (RENA-VIT) tablet 1 tablet (has no administration in time range)  pantoprazole (PROTONIX) EC tablet 40 mg (has no administration in time range)  Pancrelipase (Lip-Prot-Amyl) CPEP 6,000 Units (has no administration in time range)  feeding supplement (PRO-STAT SUGAR FREE 64) liquid 30 mL (has no administration in time range)  dextrose 50 % solution 25 mL (25 mLs Intravenous Given 09/01/18 1941)    Mobility manual wheelchair     Focused Assessments Neuro Assessment Handoff:  Swallow screen pass? None ordered         Neuro Assessment:   Neuro Checks:      Last Documented NIHSS Modified Score:   Has TPA been given? No If patient is a Neuro Trauma and patient is going to OR before floor call report to [facility] nurse: [phone] or [phone]  , Renal Assessment Handoff:  Hemodialysis Schedule: Hemodialysis Schedule: Tuesday/Thursday/Saturday Last Hemodialysis date and time: 08/30/18 partial last full tx Saturday 08/27/18    Restricted appendage: right arm     R Recommendations: See Admitting Provider Note  Report given to:   Additional Notes: negative covid, son requests to stay with pt to assist with pt communication

## 2018-09-01 NOTE — ED Notes (Signed)
In and out cathed pt, changed pt's adult diaper. Pt has small healing pressure ulcer on sacrum with allyvn pad in place

## 2018-09-01 NOTE — Progress Notes (Signed)
ABG drawn, RN made aware of normal ABG results.

## 2018-09-01 NOTE — ED Triage Notes (Signed)
To ED for eval of SOB. Pt is a dialysis pt - taken today by son and sent to hospital for eval of sob. Pts son wanted triage nurse to talk with daughter (nurse - primary caregiver for pt) who said pt was inpt until 6/26 when family wanted to bring her home due to her not eating well. Daughter concerned pt may have aspirated food. Pt very soft spoken- states she feels fine. Pt is wheelchair bound/bed bound.   Pts daughter Courtney [name]  [phone]

## 2018-09-02 ENCOUNTER — Encounter (HOSPITAL_COMMUNITY): Payer: Self-pay

## 2018-09-02 DIAGNOSIS — R0602 Shortness of breath: Secondary | ICD-10-CM | POA: Diagnosis not present

## 2018-09-02 DIAGNOSIS — I252 Old myocardial infarction: Secondary | ICD-10-CM | POA: Diagnosis not present

## 2018-09-02 DIAGNOSIS — N186 End stage renal disease: Secondary | ICD-10-CM | POA: Diagnosis not present

## 2018-09-02 DIAGNOSIS — R627 Adult failure to thrive: Secondary | ICD-10-CM

## 2018-09-02 DIAGNOSIS — E785 Hyperlipidemia, unspecified: Secondary | ICD-10-CM | POA: Diagnosis present

## 2018-09-02 DIAGNOSIS — I2510 Atherosclerotic heart disease of native coronary artery without angina pectoris: Secondary | ICD-10-CM | POA: Diagnosis present

## 2018-09-02 DIAGNOSIS — Z8249 Family history of ischemic heart disease and other diseases of the circulatory system: Secondary | ICD-10-CM | POA: Diagnosis not present

## 2018-09-02 DIAGNOSIS — E43 Unspecified severe protein-calorie malnutrition: Secondary | ICD-10-CM | POA: Diagnosis present

## 2018-09-02 DIAGNOSIS — Z993 Dependence on wheelchair: Secondary | ICD-10-CM | POA: Diagnosis not present

## 2018-09-02 DIAGNOSIS — Z992 Dependence on renal dialysis: Secondary | ICD-10-CM | POA: Diagnosis not present

## 2018-09-02 DIAGNOSIS — Z7982 Long term (current) use of aspirin: Secondary | ICD-10-CM | POA: Diagnosis not present

## 2018-09-02 DIAGNOSIS — Z7189 Other specified counseling: Secondary | ICD-10-CM

## 2018-09-02 DIAGNOSIS — E8770 Fluid overload, unspecified: Secondary | ICD-10-CM | POA: Diagnosis present

## 2018-09-02 DIAGNOSIS — Z515 Encounter for palliative care: Secondary | ICD-10-CM

## 2018-09-02 DIAGNOSIS — Z1159 Encounter for screening for other viral diseases: Secondary | ICD-10-CM | POA: Diagnosis not present

## 2018-09-02 DIAGNOSIS — L8990 Pressure ulcer of unspecified site, unspecified stage: Secondary | ICD-10-CM | POA: Diagnosis present

## 2018-09-02 DIAGNOSIS — Z681 Body mass index (BMI) 19 or less, adult: Secondary | ICD-10-CM | POA: Diagnosis not present

## 2018-09-02 DIAGNOSIS — I132 Hypertensive heart and chronic kidney disease with heart failure and with stage 5 chronic kidney disease, or end stage renal disease: Secondary | ICD-10-CM | POA: Diagnosis present

## 2018-09-02 DIAGNOSIS — E11649 Type 2 diabetes mellitus with hypoglycemia without coma: Secondary | ICD-10-CM | POA: Diagnosis not present

## 2018-09-02 DIAGNOSIS — Z91018 Allergy to other foods: Secondary | ICD-10-CM | POA: Diagnosis not present

## 2018-09-02 DIAGNOSIS — I5032 Chronic diastolic (congestive) heart failure: Secondary | ICD-10-CM | POA: Diagnosis present

## 2018-09-02 DIAGNOSIS — E1122 Type 2 diabetes mellitus with diabetic chronic kidney disease: Secondary | ICD-10-CM | POA: Diagnosis present

## 2018-09-02 DIAGNOSIS — I69351 Hemiplegia and hemiparesis following cerebral infarction affecting right dominant side: Secondary | ICD-10-CM | POA: Diagnosis not present

## 2018-09-02 DIAGNOSIS — J90 Pleural effusion, not elsewhere classified: Secondary | ICD-10-CM | POA: Diagnosis not present

## 2018-09-02 LAB — GLUCOSE, CAPILLARY
Glucose-Capillary: 173 mg/dL — ABNORMAL HIGH (ref 70–99)
Glucose-Capillary: 65 mg/dL — ABNORMAL LOW (ref 70–99)
Glucose-Capillary: 72 mg/dL (ref 70–99)
Glucose-Capillary: 73 mg/dL (ref 70–99)
Glucose-Capillary: 86 mg/dL (ref 70–99)
Glucose-Capillary: 94 mg/dL (ref 70–99)

## 2018-09-02 LAB — BASIC METABOLIC PANEL
Anion gap: 10 (ref 5–15)
BUN: 25 mg/dL — ABNORMAL HIGH (ref 8–23)
CO2: 27 mmol/L (ref 22–32)
Calcium: 8 mg/dL — ABNORMAL LOW (ref 8.9–10.3)
Chloride: 99 mmol/L (ref 98–111)
Creatinine, Ser: 4.77 mg/dL — ABNORMAL HIGH (ref 0.44–1.00)
GFR calc Af Amer: 10 mL/min — ABNORMAL LOW (ref >60)
GFR calc non Af Amer: 8 mL/min — ABNORMAL LOW (ref >60)
Glucose, Bld: 79 mg/dL (ref 70–99)
Potassium: 4.6 mmol/L (ref 3.5–5.1)
Sodium: 136 mmol/L (ref 135–145)

## 2018-09-02 LAB — CBC
HCT: 41.5 % (ref 36.0–46.0)
Hemoglobin: 12.8 g/dL (ref 12.0–15.0)
MCH: 28.1 pg (ref 26.0–34.0)
MCHC: 30.8 g/dL (ref 30.0–36.0)
MCV: 91.2 fL (ref 80.0–100.0)
Platelets: 262 10*3/uL (ref 150–400)
RBC: 4.55 MIL/uL (ref 3.87–5.11)
RDW: 16.4 % — ABNORMAL HIGH (ref 11.5–15.5)
WBC: 5.2 10*3/uL (ref 4.0–10.5)
nRBC: 0 % (ref 0.0–0.2)

## 2018-09-02 MED ORDER — CHLORHEXIDINE GLUCONATE CLOTH 2 % EX PADS: 6.0000 | MEDICATED_PAD | Freq: Every day | CUTANEOUS | Status: DC

## 2018-09-02 MED ORDER — BOOST / RESOURCE BREEZE PO LIQD CUSTOM
1.0000 | Freq: Three times a day (TID) | ORAL | Status: DC
  Administered 2018-09-02: 1 via ORAL

## 2018-09-02 MED ORDER — DEXTROSE 50 % IV SOLN
INTRAVENOUS | Status: AC
  Administered 2018-09-02: 50 mL
  Filled 2018-09-02: qty 50

## 2018-09-02 NOTE — Consult Note (Addendum)
[facility] Nurse wound consult note Reason for Consult: Consult requested for sacrum, pt is very emaciated with protruding sacrum bone. Wound type: Stage 2 in two different locations to sacrum; .8X.8X.1cm and .5X.5X.1cm Pressure Injury POA: Yes Wound bed: pink and dry Drainage (amount, consistency, odor) no odor or drainage Periwound: intact skin surrounding Dressing procedure/placement/frequency: Foam dressing to protect and promote healing. Please re-consult if further assistance is needed.  Thank-you,  Julien Girt MSN, [place], [place], [place], [place]

## 2018-09-02 NOTE — Care Management Obs Status (Signed)
[facility] NOTIFICATION   Patient Details  Name: ZYKERRIA TANTON MRN: [id number] Date of Birth: [birth year]   Medicare Observation Status Notification Given:       Benard Halsted, LCSW 09/02/2018, 4:53 PM

## 2018-09-02 NOTE — Progress Notes (Signed)
PROGRESS NOTE    Courtney [name]  DGL:[id number] DOB: 17-Feb-1941 DOA: 09/01/2018 PCP: Neale Burly, MD   Brief Narrative:  Courtney [name] is a 78 y.o. female with medical history significant of Stroke, ESRD on TTS dialysis, DM, on chronic midodrine, severe malnutrition.  Wheelchair bound at baseline.  Patient admitted for GIB from duodenal ulcer last month, this admit complicated by CHF, pleural effusion, iatrogenic pneumothorax after drainage of pleural effusion, intubation.  Reoccurrence of plural effusion but not drained secondary to no severe SOB with it and wanting to avoid a repeat of the PTX.  Patient with poor p.o. intake for past 3 to 4 days.  Reportedly, she arrived to dialysis clinic on 09/01/2018 and was observed to be having shortness of breath so she was sent to the emergency department.  Per daughter, patient has not been feeling well and has decreased energy and p.o. intake for past 2 weeks since discharge.  No other complaint.  Patient herself denied any shortness of breath and she was not hypoxic.  Upon arrival to the ED, she was saturating 95% on room air and ABG was within normal limits.  Chest x-ray showed large left-sided pleural effusion, slightly increased compared to x-ray done on 08/14/2018.  She was subsequently admitted under hospitalist service.  Consultants:   Nephrology  Procedures:   None  Antimicrobials:   None   Subjective: Patient seen and examined.  She was alert but disoriented at her baseline.  She had no complaint.  She was not dyspneic and was not requiring any oxygen and did not look uncomfortable at all.  Objective: Vitals:   09/01/18 2145 09/01/18 2200 09/01/18 2320 09/02/18 0513  BP: 132/81 122/73  (!) 140/59  Pulse:    65  Resp: 19 16    Temp:    97.9 F (36.6 C)  TempSrc:      SpO2:    100%  Weight:   32.4 kg   Height:   4\' 11"  (1.499 m)     Intake/Output Summary (Last 24 hours) at 09/02/2018 1310 Last data filed at 09/02/2018 1257 Gross  per 24 hour  Intake 240 ml  Output -  Net 240 ml   Filed Weights   09/01/18 2320  Weight: 32.4 kg    Examination:  General exam: Appears calm and comfortable, alert but disoriented at baseline.,  Cachectic Respiratory system: Diminished breath sounds in the left middle and lower lobe. Respiratory effort normal. Cardiovascular system: S1 & S2 heard, RRR. No JVD, murmurs, rubs, gallops or clicks. No pedal edema. Gastrointestinal system: Abdomen is nondistended, soft and nontender. No organomegaly or masses felt. Normal bowel sounds heard. Central nervous system: Alert and oriented x1.  She knew that she was in the hospital.  She could not answer any other questions about orientation.. No focal neurological deficits. Extremities: Symmetric 5 x 5 power. Skin: No rashes, lesions or ulcers Psychiatry: Judgement and insight appear poor. Mood & affect flat.   Data Reviewed: I have personally reviewed following labs and imaging studies  CBC: Recent Labs  Lab 09/01/18 1531 09/01/18 1941 09/02/18 0251  WBC 5.4  --  5.2  HGB 12.4 12.9 12.8  HCT 42.0 38.0 41.5  MCV 93.8  --  91.2  PLT 285  --  518   Basic Metabolic Panel: Recent Labs  Lab 09/01/18 1531 09/01/18 1620 09/01/18 1941 09/02/18 0251  NA 134*  --  133* 136  K 4.6  --  4.1 4.6  CL  100  --   --  99  CO2 25  --   --  27  GLUCOSE 68*  --   --  79  BUN 24*  --   --  25*  CREATININE 4.59*  --   --  4.77*  CALCIUM 8.0*  --   --  8.0*  MG  --  2.2  --   --   PHOS  --  3.1  --   --    GFR: Estimated Creatinine Clearance: 5.1 mL/min (A) (by C-G formula based on SCr of 4.77 mg/dL (H)). Liver Function Tests: Recent Labs  Lab 09/01/18 1620  AST 27  ALT 13  ALKPHOS 76  BILITOT 0.7  PROT 5.3*  ALBUMIN 1.8*   No results for input(s): LIPASE, AMYLASE in the last 168 hours. Recent Labs  Lab 09/01/18 2050  AMMONIA 12   Coagulation Profile: No results for input(s): INR, PROTIME in the last 168 hours. Cardiac  Enzymes: No results for input(s): CKTOTAL, CKMB, CKMBINDEX, TROPONINI in the last 168 hours. BNP (last 3 results) No results for input(s): PROBNP in the last 8760 hours. HbA1C: No results for input(s): HGBA1C in the last 72 hours. CBG: Recent Labs  Lab 09/02/18 0039 09/02/18 0416 09/02/18 0738 09/02/18 0842 09/02/18 1201  GLUCAP 86 72 65* 173* 94   Lipid Profile: No results for input(s): CHOL, HDL, LDLCALC, TRIG, CHOLHDL, LDLDIRECT in the last 72 hours. Thyroid Function Tests: Recent Labs    09/01/18 2050  TSH 3.382   Anemia Panel: No results for input(s): VITAMINB12, FOLATE, FERRITIN, TIBC, IRON, RETICCTPCT in the last 72 hours. Sepsis Labs: No results for input(s): PROCALCITON, LATICACIDVEN in the last 168 hours.  Recent Results (from the past 240 hour(s))  SARS Coronavirus 2 (CEPHEID- Performed in [place] hospital lab), Hosp Order     Status: None   Collection Time: 09/01/18  7:40 PM   Specimen: Nasopharyngeal Swab  Result Value Ref Range Status   SARS Coronavirus 2 NEGATIVE NEGATIVE Final    Comment: (NOTE) If result is NEGATIVE SARS-CoV-2 target nucleic acids are NOT DETECTED. The SARS-CoV-2 RNA is generally detectable in upper and lower  respiratory specimens during the acute phase of infection. The lowest  concentration of SARS-CoV-2 viral copies this assay can detect is 250  copies / mL. A negative result does not preclude SARS-CoV-2 infection  and should not be used as the sole basis for treatment or other  patient management decisions.  A negative result may occur with  improper specimen collection / handling, submission of specimen other  than nasopharyngeal swab, presence of viral mutation(s) within the  areas targeted by this assay, and inadequate number of viral copies  (<250 copies / mL). A negative result must be combined with clinical  observations, patient history, and epidemiological information. If result is POSITIVE SARS-CoV-2 target nucleic  acids are DETECTED. The SARS-CoV-2 RNA is generally detectable in upper and lower  respiratory specimens dur ing the acute phase of infection.  Positive  results are indicative of active infection with SARS-CoV-2.  Clinical  correlation with patient history and other diagnostic information is  necessary to determine patient infection status.  Positive results do  not rule out bacterial infection or co-infection with other viruses. If result is PRESUMPTIVE POSTIVE SARS-CoV-2 nucleic acids MAY BE PRESENT.   A presumptive positive result was obtained on the submitted specimen  and confirmed on repeat testing.  While 2019 novel coronavirus  (SARS-CoV-2) nucleic acids may be  present in the submitted sample  additional confirmatory testing may be necessary for epidemiological  and / or clinical management purposes  to differentiate between  SARS-CoV-2 and other Sarbecovirus currently known to infect humans.  If clinically indicated additional testing with an alternate test  methodology [phone]) is advised. The SARS-CoV-2 RNA is generally  detectable in upper and lower respiratory sp ecimens during the acute  phase of infection. The expected result is Negative. Fact Sheet for Patients:  StrictlyIdeas.no Fact Sheet for Healthcare Providers: BankingDealers.co.za This test is not yet approved or cleared by the [place] FDA and has been authorized for detection and/or diagnosis of SARS-CoV-2 by FDA under an Emergency Use Authorization (EUA).  This EUA will remain in effect (meaning this test can be used) for the duration of the COVID-19 declaration under Section 564(b)(1) of the Act, 21 U.S.C. section 360bbb-3(b)(1), unless the authorization is terminated or revoked sooner. Performed at [facility], [place] [address], [place], [place] [address]       Radiology Studies: Dg Chest 2 View  Result Date: 09/01/2018 CLINICAL DATA:  Acute  shortness of breath EXAM: CHEST - 2 VIEW COMPARISON:  08/14/2018 FINDINGS: A large LEFT pleural effusion has slightly increased. RIGHT pleural effusion again noted. Bilateral LOWER lung atelectasis identified. Pulmonary vascular congestion is present. Cardiomediastinal silhouette does not appear significantly changed. No pneumothorax or acute bony abnormality. RIGHT axillary vascular graft again noted. IMPRESSION: Slightly increased large LEFT pleural effusion. No definite change in RIGHT pleural effusion or bilateral LOWER lung atelectasis. Pulmonary vascular congestion. Electronically Signed   By: Margarette Canada M.D.   On: 09/01/2018 16:11   Ct Head Wo Contrast  Result Date: 09/01/2018 CLINICAL DATA:  Altered level of consciousness (LOC), unexplained EXAM: CT HEAD WITHOUT CONTRAST TECHNIQUE: Contiguous axial images were obtained from the base of the skull through the vertex without intravenous contrast. COMPARISON:  CT head 09/08/2017 FINDINGS: Brain: No evidence of acute infarction, hemorrhage, hydrocephalus, extra-axial collection or mass lesion/mass effect. Diffuse moderate atrophy with extensive subcortical and deep white matter patchy hypoattenuation compatible with microvascular ischemic changes. Stable areas of gliosis likely reflecting prior infarct in the anterior limb left internal capsule in the right cerebellum. 43 Vascular: Atherosclerotic calcification of the carotid siphons and intradural vertebral arteries. No hyperdense vessel. Skull: Normal. Negative for fracture or focal lesion. Sinuses/Orbits: Paranasal sinuses and mastoids are predominantly clear. The orbits are unremarkable. Other: None. IMPRESSION: 1. No acute intracranial abnormality. 2. Atrophy and microvascular ischemic changes. Electronically Signed   By: MD Lovena Le   On: 09/01/2018 22:01    Scheduled Meds: . aspirin EC  81 mg Oral Daily  . atorvastatin  20 mg Oral Daily  . calcium-vitamin D  1 tablet Oral Q breakfast  .  cholecalciferol  1,000 Units Oral Daily  . escitalopram  5 mg Oral Daily  . feeding supplement (NEPRO CARB STEADY)  237 mL Oral BID BM  . feeding supplement (PRO-STAT SUGAR FREE 64)  30 mL Oral TID WC  . lipase/protease/amylase  12,000 Units Oral TID WC  . midodrine  10 mg Oral TID WC  . multivitamin  1 tablet Oral QHS  . pantoprazole  40 mg Oral BID   Continuous Infusions:   LOS: 0 days   Assessment & Plan:   Principal Problem:   Recurrent pleural effusion on left Active Problems:   ESRD on dialysis ([place])   Type 2 diabetes mellitus (HCC)   Pressure injury of skin   Protein-calorie  malnutrition, severe   Chronic diastolic CHF (congestive heart failure) (HCC)   History of stroke   Excessive somnolence disorder   Recurrent large left pleural effusion: Patient is completely comfortable with no hypoxia at all.  There is not much difference compared to pleural effusion that she had on 08/14/2018.  At that time, PCCM deferred doing any thoracentesis due to recent pneumothorax and the fact that patient was comfortable.  At this point in time, patient once again is comfortable so I do not see a need for doing any thoracentesis.  I called patient's daughter and explained to her and also discussed about possible discharging home after dialysis today.  She was in agreement however nephrology called her after seeing the patient, they talk to her to talk to palliative care once again.  During last hospitalization, family wanted to be aggressive and they did not want to be DNR either and no PEG tube but according to Dr. Jonnie Finner, based on his discussion with the daughter, they seem to be acceptable to the fact that patient does not have much of months left in her life with her comorbidities and that there might be accepting of those facts and might consider hospice so per Dr. Jonnie Finner recommendations, I have consulted palliative care.  ESRD on hemodialysis: Nephrology consulted.  Type 2 diabetes  mellitus: Slightly hypoglycemic.  Not on any long-acting insulin.  Continue SSI.  Malnutrition: This is also chronic issue.  She has extremely poor p.o. intake.  Dietary consulted.  General debility: Palliative care consulted.  Chronic diastolic CHF: Managed through dialysis.  DVT prophylaxis: SCD/avoiding heparin products due to recent GI bleed Code Status: Full code Family Communication: Discussed with patient's daughter over the phone.  Palliative care consulted. Disposition Plan: To be determined based on their discussion with the palliative care.   Time spent: 39 minutes   Darliss Cheney, MD [facility] Pager [phone]  If 7PM-7AM, please contact night-coverage [email or web address] Password [email or web address] 09/02/2018, 1:10 PM

## 2018-09-02 NOTE — Consult Note (Signed)
Consultation Note Date: 09/02/2018   Patient Name: Courtney [name]  DOB: 1940/07/29  MRN: [id number]  Age / Sex: 78 y.o., female  PCP: Courtney Burly, MD Referring Physician: Darliss Cheney, MD  Reason for Consultation: Establishing goals of care  HPI/Patient Profile: 78 y.o. female  with past medical history of CVA, ESRD on HD, T2DM, and malnutrition admitted on 09/01/2018 with poor PO intake. Patient with recent admission for GIB that was complicated by pleural effusion, thoracentesis, pneumothorax, chest tube, and shot-term intubation. She was discharged 6/26. She was sent to ED from dialysis center due to concern about her breathing - patient denies shortness of breath. PMT consulted for [place].   Clinical Assessment and Goals of Care: I have reviewed medical records including EPIC notes, labs and imaging, and received report from RN.  I then spoke with patient's daughter, Courtney [name],  to discuss diagnosis, prognosis, GOC, EOL wishes, disposition and options.Courtney [name] is familiar with palliative care as we spoke during patient's last admission. 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   Courtney [name] shares that she and her brother are both HCPOA - however, she is primary and he is secondary.   Courtney [name] and I reviewed events of patient's previous hospitalization. Then she shared about how the patient has been doing since discharge. Tells me for the first 4 days post discharge patient did not eat at all. She then started eating some - but Courtney [name] tells me this would be a few teaspoons at a time and a few sips of liquid - nothing substantial. She tells me for the 4 days leading up to admission - patient complained of not feeling well and stopped all PO intake. She tells me that there were moments that the patient appeared to not be breathing well but patient denied feeling short of breath. Courtney [name] also shares that patient has remained non-ambulatory. She tells me  patient is not even able to turn herself in bed - Courtney [name] turns her every two hours.   Courtney [name] shares about the heavy burden of caring for her mother who requires total care. Courtney [name] also has kids with special needs. She tells me it has been "more than she thought it would be".    We discussed her current illness and what it means in the larger context of her on-going co-morbidities.  Natural disease trajectory and expectations at EOL were discussed. I shared my concerns that we were nearing the end of Courtney [name]'s life - Courtney [name] agrees and shares she is not surprised by this. We discussed patient's lack of desire to eat/drink. We discussed that patient will likely not be able to tolerate dialysis much longer - her body is getting too weak. Courtney [name] agrees with this as well.   She speaks about her mother prior to her illness - tells me how strong she was, worked 2 jobs. She tells me she was always a caregiver and she knows her mother is not comfortable being the one who requires caregivers.   I attempted to elicit values and goals of care important to the patient.  Courtney [name] tells me "she is tired of everything". She does not think the patient would want to continue any aggressive care, including dialysis. Courtney [name] tells me her mother has no quality of life - she tells me that she believes by prolonging her life we are prolonging suffering.   Courtney [name] expresses grief over the situation telling me "I'm watching my super hero go down" and "she is a shadow of who she  used to be". Emotional support provided throughout conversation.   The difference between aggressive medical intervention and comfort care was considered in light of the patient's goals of care. We discussed what focusing on comfort would look like - discussed cessation of dialysis and involving hospice to provide comfort focused care. Courtney [name] shares that this is the type of care she wants for her mother; however, she fears her brother is not  ready for this. She tells me her brother would view this as "giving up". Courtney [name] shares her plan to sit down with her brother and discuss these issues thoroughly. I offered to call her brother - Courtney [name] tells me she feels like he will receive conversation better from her. She also shares that he is difficult to get on the phone. She tells me she does not want to make any decisions right now until her brother agrees.   Advance directives, concepts specific to code status, artifical feeding and hydration, and rehospitalization were considered and discussed. Courtney [name] confirms a desire to avoid PEG tube. We discussed code status - Courtney [name] agrees DNR is appropriate, but again is hesitant to make decision until she has conversation with her brother.   Courtney [name] understands patient will be discharged soon and she is unsure when her brother will be able to make a decision regarding hospice/comfort care/DNR. We discussed that they could discuss this after discharge and patient's PCP could make a referral to hospice. She expresses understanding.   Questions and concerns were addressed. The family was encouraged to call with questions or concerns.   Primary Decision Maker NEXT OF KIN - daughter and son   SUMMARY OF RECOMMENDATIONS   - lengthy conversation about patient at EOL - discussed comfort care, DNR, cessation of HD - daughter's wishes are in line with this but son's are unclear - daughter is going to speak to son (requested that I not reach out to him) - understands patient will be discharged soon and we discussed outpatient referral to hospice by PCP post discharge if son agrees to this type of care - will request outpatient palliative to follow and assist with transition to hospice when family is ready  Code Status/Advance Care Planning:  Full code   Symptom Management:   Per primary - no symptom concerns per RN  Palliative Prophylaxis:   Aspiration, Delirium Protocol, Frequent Pain  Assessment, Oral Care, Palliative Wound Care and Turn Reposition  Additional Recommendations (Limitations, Scope, Preferences):  No Artificial Feeding  Psycho-social/Spiritual:   Desire for further Chaplaincy support:no  Additional Recommendations: Education on Hospice  Prognosis:   Unable to determine - poor prognosis  Discharge Planning: To Be Determined      Primary Diagnoses: Present on Admission: . Protein-calorie malnutrition, severe . Recurrent pleural effusion on left . Chronic diastolic CHF (congestive heart failure) ([place]) . Pressure injury of skin . Excessive somnolence disorder   I have reviewed the medical record, interviewed the patient and family, and examined the patient. The following aspects are pertinent.  Past Medical History:  Diagnosis Date  . Anemia 07/2018  . CHF (congestive heart failure) ([place])   . Coronary artery disease   . Diabetes mellitus without complication ([place])   . ESRD on dialysis ([place]) 02/10/2017  . Hyperlipidemia   . Hypertension   . Myocardial infarct ([place])   . Stroke [facility])    2-3 yrs ago- right sided weakness   Social History   Socioeconomic History  . Marital status: Single    Spouse name: Not on  file  . Number of children: Not on file  . Years of education: Not on file  . Highest education level: Not on file  Occupational History  . Not on file  Social Needs  . Financial resource strain: Not on file  . Food insecurity    Worry: Not on file    Inability: Not on file  . Transportation needs    Medical: Not on file    Non-medical: Not on file  Tobacco Use  . Smoking status: Never Smoker  . Smokeless tobacco: Never Used  Substance and Sexual Activity  . Alcohol use: No  . Drug use: No  . Sexual activity: Not Currently    Birth control/protection: None  Lifestyle  . Physical activity    Days per week: Not on file    Minutes per session: Not on file  . Stress: Not on file  Relationships  . Social Product manager on phone: Not on file    Gets together: Not on file    Attends religious service: Not on file    Active member of club or organization: Not on file    Attends meetings of clubs or organizations: Not on file    Relationship status: Not on file  Other Topics Concern  . Not on file  Social History Narrative  . Not on file   Family History  Problem Relation Age of Onset  . Heart disease Mother   . Heart disease Father    Scheduled Meds: . aspirin EC  81 mg Oral Daily  . atorvastatin  20 mg Oral Daily  . calcium-vitamin D  1 tablet Oral Q breakfast  . cholecalciferol  1,000 Units Oral Daily  . escitalopram  5 mg Oral Daily  . feeding supplement  1 Container Oral TID BM  . feeding supplement (PRO-STAT SUGAR FREE 64)  30 mL Oral TID WC  . lipase/protease/amylase  12,000 Units Oral TID WC  . midodrine  10 mg Oral TID WC  . multivitamin  1 tablet Oral QHS  . pantoprazole  40 mg Oral BID   Continuous Infusions: PRN Meds:.acetaminophen **OR** acetaminophen, ondansetron **OR** ondansetron (ZOFRAN) IV Allergies  Allergen Reactions  . Grapefruit Flavor [Flavoring Agent]     Reaction unknown    Vital Signs: BP (!) 118/53 (BP Location: Left Arm)   Pulse 72   Temp 98.1 F (36.7 C) (Oral)   Resp 18   Ht 4\' 11"  (1.499 m)   Wt 32.4 kg   SpO2 100%   BMI 14.43 kg/m  Pain Scale: 0-10   Pain Score: 0-No pain   SpO2: SpO2: 100 % O2 Device:SpO2: 100 % O2 Flow Rate: .   IO: Intake/output summary:   Intake/Output Summary (Last 24 hours) at 09/02/2018 1422 Last data filed at 09/02/2018 1257 Gross per 24 hour  Intake 240 ml  Output -  Net 240 ml    LBM:   Baseline Weight: Weight: 32.4 kg Most recent weight: Weight: 32.4 kg     Palliative Assessment/Data: PPS 20%    The above conversation was completed via telephone due to the visitor restrictions during the COVID-19 pandemic. Thorough chart review and discussion with necessary members of the care team was completed  as part of assessment. All issues were discussed and addressed but no physical exam was performed.  Time Total: 90 minutes Greater than 50%  of this time was spent counseling and coordinating care related to the above assessment and plan.  Juel Burrow, DNP, AGNP-C Palliative Medicine Team [phone] Pager: [phone]

## 2018-09-02 NOTE — Consult Note (Signed)
Renal Service Consult Note [facility]  Courtney [name] 09/02/2018 [name] Requesting Physician:  Dr Doristine Bosworth  Reason for Consult:  ESRD pt w/ SOB HPI: The patient is a 78 y.o. year-old with hx of MI, HTN, HL, CVA, DM2 and ESRD on HD TTS in [place], [place] admitted sent from OP HD yest for SOB, did not get dialysis. CXR showed persistent large L effusion.  Pt on nasal O2.  Asked to see for ESRD.    Patient lives at home w/ her son, per the patient , she is a marginal historian. She has a WC and walker at home, hasn't been walking much lately.  Have d/w pt's daughter, pt on HD since around 2015.   Patient denies any sig CP, prod cough or fevers.  No abd pain or diarrhea or n/v.      ROS  denies CP  no joint pain   no HA  no blurry vision  no rash  no diarrhea  no nausea/ vomiting  Past Medical History  Past Medical History:  Diagnosis Date  . Anemia 07/2018  . CHF (congestive heart failure) ([place])   . Coronary artery disease   . Diabetes mellitus without complication ([place])   . ESRD on dialysis ([place]) 02/10/2017  . Hyperlipidemia   . Hypertension   . Myocardial infarct ([place])   . Stroke [facility])    2-3 yrs ago- right sided weakness   Past Surgical History  Past Surgical History:  Procedure Laterality Date  . APPENDECTOMY    . AV FISTULA PLACEMENT Right 09/20/2015   Procedure: PLACEMENT OF RIGHT UPPER EXTREMITY ARTERIOVENOUS GORE-TEX GRAFT FOR HEMODIALYSIS ACCESS;  Surgeon: Vickie Epley, MD;  Location: [facility];  Service: Vascular;  Laterality: Right;  . CATARACT EXTRACTION W/PHACO Left 06/29/2013   Procedure: CATARACT EXTRACTION PHACO AND INTRAOCULAR LENS PLACEMENT (IOC);  Surgeon: Tonny Branch, MD;  Location: [facility];  Service: Ophthalmology;  Laterality: Left;  CDE 12.25  . CATARACT EXTRACTION W/PHACO Right 07/27/2013   Procedure: CATARACT EXTRACTION PHACO AND INTRAOCULAR LENS PLACEMENT (IOC);  Surgeon: Tonny Branch, MD;  Location: [facility];  Service: Ophthalmology;   Laterality: Right;  CDE:7.72  . COLONOSCOPY WITH PROPOFOL N/A 08/07/2018   Procedure: COLONOSCOPY WITH PROPOFOL;  Surgeon: Ronnette Juniper, MD;  Location: [place];  Service: Gastroenterology;  Laterality: N/A;  . ESOPHAGOGASTRODUODENOSCOPY N/A 08/03/2018   Procedure: ESOPHAGOGASTRODUODENOSCOPY (EGD);  Surgeon: Clarene Essex, MD;  Location: [place];  Service: Endoscopy;  Laterality: N/A;  . ESOPHAGOGASTRODUODENOSCOPY (EGD) WITH PROPOFOL N/A 08/07/2018   Procedure: ESOPHAGOGASTRODUODENOSCOPY (EGD) WITH PROPOFOL;  Surgeon: Ronnette Juniper, MD;  Location: [place];  Service: Gastroenterology;  Laterality: N/A;  . GIVENS CAPSULE STUDY N/A 08/07/2018   Procedure: GIVENS CAPSULE STUDY;  Surgeon: Ronnette Juniper, MD;  Location: [place];  Service: Gastroenterology;  Laterality: N/A;  . HEMOSTASIS CLIP PLACEMENT  08/07/2018   Procedure: HEMOSTASIS CLIP PLACEMENT;  Surgeon: Ronnette Juniper, MD;  Location: [place];  Service: Gastroenterology;;  . HOT HEMOSTASIS N/A 08/03/2018   Procedure: HOT HEMOSTASIS (ARGON PLASMA COAGULATION/BICAP);  Surgeon: Clarene Essex, MD;  Location: [place];  Service: Endoscopy;  Laterality: N/A;  . INSERTION OF DIALYSIS CATHETER Right 09/11/2015   Procedure: INSERTION OF TUNNELED DIALYSIS CATHETER;  Surgeon: Vickie Epley, MD;  [facility];  Service: Vascular;  Laterality: Right;  . INTRAMEDULLARY (IM) NAIL INTERTROCHANTERIC Right 07/20/2012   Procedure: INTRAMEDULLARY (IM) NAIL INTERTROCHANTRIC;  Surgeon: Carole Civil, MD;  Location: [facility];  Service: Orthopedics;  Laterality:  Right;  . IR AV DIALY SHUNT INTRO NEEDLE/INTRAC INITIAL W/PTA/STENT/IMG RT Right 07/20/2018  . IR GENERIC HISTORICAL  09/25/2015   IR US GUIDE VASC ACCESS RIGHT 09/25/2015 Sandi Mariscal, MD [facility]  . IR GENERIC HISTORICAL  09/25/2015   IR FLUORO GUIDE CV LINE RIGHT 09/25/2015 Sandi Mariscal, MD [facility]  . IR GENERIC HISTORICAL  09/25/2015   IR REMOVAL TUN CV CATH W/O FL 09/25/2015 Sandi Mariscal, MD  [facility]  . IR THROMBECTOMY AV FISTULA W/THROMBOLYSIS/PTA INC/SHUNT/IMG RIGHT Right 09/06/2017  . IR THROMBECTOMY AV FISTULA W/THROMBOLYSIS/PTA INC/SHUNT/IMG RIGHT Right 09/29/2017  . IR THROMBECTOMY AV FISTULA W/THROMBOLYSIS/PTA INC/SHUNT/IMG RIGHT Right 07/20/2018  . IR US GUIDE VASC ACCESS RIGHT  09/06/2017  . IR US GUIDE VASC ACCESS RIGHT  09/29/2017  . IR US GUIDE VASC ACCESS RIGHT  07/20/2018  . IR US GUIDE VASC ACCESS RIGHT  07/20/2018  . SCLEROTHERAPY  08/03/2018   Procedure: Clide Deutscher;  Surgeon: Clarene Essex, MD;  Location: [facility] ENDOSCOPY;  Service: Endoscopy;;   Family History  Family History  Problem Relation Age of Onset  . Heart disease Mother   . Heart disease Father    Social History  reports that she has never smoked. She has never used smokeless tobacco. She reports that she does not drink alcohol or use drugs. Allergies  Allergies  Allergen Reactions  . Grapefruit Flavor [Flavoring Agent]     Reaction unknown   Home medications Prior to Admission medications   Medication Sig Start Date End Date Taking? Authorizing Provider  Amino Acids-Protein Hydrolys (FEEDING SUPPLEMENT, PRO-STAT SUGAR FREE 64,) LIQD Take 30 mLs by mouth 3 (three) times daily with meals. 08/19/18  Yes Mikhail, Velta Addison, DO  aspirin EC 81 MG tablet Take 81 mg by mouth daily.   Yes [provider]  atorvastatin (LIPITOR) 20 MG tablet Take 20 mg by mouth daily.   Yes [provider]  calcium-vitamin D (OSCAL WITH D) 500-200 MG-UNIT tablet Take 1 tablet by mouth.   Yes [provider]  cholecalciferol (VITAMIN D) 400 units TABS tablet Take 1,000 Units by mouth daily.    Yes [provider]  escitalopram (LEXAPRO) 5 MG tablet Take 5 mg by mouth daily.   Yes [provider]  loperamide (IMODIUM A-D) 2 MG tablet Take 2 mg by mouth every 6 (six) hours as needed for diarrhea or loose stools.   Yes [provider]  midodrine (PROAMATINE) 10 MG tablet Take 1  tablet (10 mg total) by mouth 3 (three) times daily with meals. 08/19/18  Yes Mikhail, [place], DO  multivitamin (RENA-VIT) TABS tablet Take 1 tablet by mouth at bedtime. 08/19/18  Yes Mikhail, Maryann, DO  Pancrelipase, Lip-Prot-Amyl, (CREON) 6000 units CPEP Take 1 capsule by mouth 3 (three) times daily with meals. 09/27/17  Yes [provider]  pantoprazole (PROTONIX) 40 MG tablet Take 1 tablet (40 mg total) by mouth 2 (two) times daily. 08/19/18  Yes Cristal Ford, DO   Liver Function Tests Recent Labs  Lab 09/01/18 1620  AST 27  ALT 13  ALKPHOS 76  BILITOT 0.7  PROT 5.3*  ALBUMIN 1.8*   No results for input(s): LIPASE, AMYLASE in the last 168 hours. CBC Recent Labs  Lab 09/01/18 1531 09/01/18 1941 09/02/18 0251  WBC 5.4  --  5.2  HGB 12.4 12.9 12.8  HCT 42.0 38.0 41.5  MCV 93.8  --  91.2  PLT 285  --  944   Basic Metabolic Panel Recent Labs  Lab 09/01/18 1531 09/01/18 1620 09/01/18 1941 09/02/18 0251  NA 134*  --  133* 136  K 4.6  --  4.1 4.6  CL 100  --   --  99  CO2 25  --   --  27  GLUCOSE 68*  --   --  79  BUN 24*  --   --  25*  CREATININE 4.59*  --   --  4.77*  CALCIUM 8.0*  --   --  8.0*  PHOS  --  3.1  --   --    Iron/TIBC/Ferritin/ %Sat    Component Value Date/Time   IRON 111 07/31/2018 0336   TIBC 136 (L) 07/31/2018 0336   FERRITIN 1,324 (H) 07/31/2018 0336   IRONPCTSAT 82 (H) 07/31/2018 0336    Vitals:   09/01/18 2200 09/01/18 2320 09/02/18 0513 09/02/18 1349  BP: 122/73  (!) 140/59 (!) 118/53  Pulse:   65 72  Resp: 16   18  Temp:   97.9 F (36.6 C) 98.1 F (36.7 C)  TempSrc:      SpO2:   100% 100%  Weight:  32.4 kg    Height:  4\' 11"  (1.499 m)      Exam Gen small framed AAF no distress, very weak and debilitated, curled up on R side in bed No rash, cyanosis or gangrene Sclera anicteric, throat clear  +jvd Chest decreased BS L base and R side clear RRR no MRG Abd soft ntnd no mass or ascites +bs GU defer MS no joint  effusions or deformity Ext asymmetric RLE> LLE edema dependent areas, +RUE edema Neuro is groggy but arouses, very weak , answers simple questions R arm AVG+bruit   Home meds:  - aspirin 81/ atrovastatin 20  - pantoprazole 40 bid  - midodrine 10 tid  - escitalopram 5 qd  - prn's/ vitamins/ supplements     Outpt HD: TTS [facility]  3h (usual runs 2h)   32.5kg   2/2.5 bath  R AVG     Assessment/ Plan: 1. SOB/ persistent L pleural effusion: pt sig vol overloaded w/ soft tissue edema and L effusion, hopefully can impact this w/ HD but given low alb/ malnutrition this fluid is 3rd spaced and may very well not respond to HD.  Pt is very debilitated , was here for 20 days in June, and prognosis going forward w/ her debility, malnutrition and on long term HD is not good. I have discussed w/ pt's daughter about EOL issues, needs for GOC/ DNR consideration and palliative care evaluation.  Daughter is open to DNR consideration and was going to discuss w/ her brother (+POA) before I called. She is open to pall care consultation.  I think that hospice option should also be considered please per pall care when discussing w/ family and pt.   2. ESRD: on HD TTS. Plan HD today, then again tomorrow to get back on schedule.  3. Vol excess: sig edema and L effusion, UF as tolerated, cont midodrine 4. Malnutrition/ hypoalb 5. CAD/ hx MI 6. H/o CVA      Kelly Splinter  MD 09/02/2018, 2:17 PM

## 2018-09-02 NOTE — Progress Notes (Signed)
Pt's CBG noted to be 65. Hypoglycemic protocol initiated, 1 amp of D administered per protocol. Pt asymptomatic, showing no signs of distress. Breakfast tray ordered.  RN to continue to monitor.

## 2018-09-02 NOTE — Progress Notes (Signed)
Pt vomitted X 1.

## 2018-09-02 NOTE — Progress Notes (Signed)
Initial Nutrition Assessment  RD working remotely.  DOCUMENTATION CODES:   Severe malnutrition in context of chronic illness, Underweight  INTERVENTION:   -D/c Nepro Shake po BID, each supplement provides 425 kcal and 19 grams protein -Continue 30 ml Prostat TID with meals, each supplement provides 100 kcals and 15 grams protein -Boost Breeze po TID, each supplement provides 250 kcal and 9 grams of protein -Downgrade diet to dysphagia 2 diet (mechanical soft) for ease of intake (pt with no teeth) -Continue renal MVI daily -Feeding assistance with meals  NUTRITION DIAGNOSIS:   Severe Malnutrition related to chronic illness(ESRD on HD, dementia) as evidenced by energy intake < or equal to 75% for > or equal to 1 month, percent weight loss.  GOAL:   Patient will meet greater than or equal to 90% of their needs  MONITOR:   PO intake, Supplement acceptance, Labs, Weight trends, Skin, I & O's  REASON FOR ASSESSMENT:   Consult Assessment of nutrition requirement/status  ASSESSMENT:   Courtney [name] is a 78 y.o. female with medical history significant of Stroke, ESRD on TTS dialysis, DM, on chronic midodrine, severe malnutrition.  Wheelchair bound at baseline.  Pt admitted with recurrent lt pleural effusion.   Reviewed I/O's: +240 ml x 24 hours  Pt unable to provide further nutrition history.   Pt very familiar to this RD due to recent prolonged admission. Pt with poor oral intake during last admission, which also required a cortrak tube. Pt family did not want to pursue PEG placement at that time and per chart review, that pt/ family still do not desire this. Prior to pt discharge last admission, pt was eating poorly, consuming less than 25% of estimated energy intake via PO route. Pt continued to eat poorly after discharge per chart review.  Reviewed wt hx; pt has experienced a 26.7% wt loss over the past month, which is significant for time frame. Pt also with severe fat and  muscle depletions during last admission and suspect nutrition-focused physical exam has remained unchanged given continued poor oral intake and weight loss.   Per prior admission, pt does not like Nepro shakes. She also has improved oral intake with feeding assistance. She does not have teeth; will downgrade to a mechanically altered diet for ease of intake.   Palliative care consult pending to discuss goals of care.   Labs reviewed: CBGS: 65-173.   NUTRITION - FOCUSED PHYSICAL EXAM:    Most Recent Value  Orbital Region  Unable to assess  Upper Arm Region  Unable to assess  Thoracic and Lumbar Region  Unable to assess  Buccal Region  Unable to assess  Temple Region  Unable to assess  Clavicle Bone Region  Unable to assess  Clavicle and Acromion Bone Region  Unable to assess  Scapular Bone Region  Unable to assess  Dorsal Hand  Unable to assess  Patellar Region  Unable to assess  Anterior Thigh Region  Unable to assess  Posterior Calf Region  Unable to assess  Edema (RD Assessment)  Unable to assess  Hair  Unable to assess  Eyes  Unable to assess  Mouth  Unable to assess  Skin  Unable to assess  Nails  Unable to assess       Diet Order:   Diet Order            Diet renal with fluid restriction Fluid restriction: 1200 mL Fluid; Room service appropriate? No; Fluid consistency: Thin  Diet effective now  EDUCATION NEEDS:   Not appropriate for education at this time  Skin:  Skin Assessment: Skin Integrity Issues: Skin Integrity Issues:: Stage II, Other (Comment) Stage I: - Stage II: Sacrum Other: MASD to buttocks  Last BM:  Unknown  Height:   Ht Readings from Last 1 Encounters:  09/01/18 4\' 11"  (1.499 m)    Weight:   Wt Readings from Last 1 Encounters:  09/01/18 32.4 kg    Ideal Body Weight:  44.7 kg  BMI:  Body mass index is 14.43 kg/m.  Estimated Nutritional Needs:   Kcal:  1200-1400  Protein:  50-65 grams  Fluid:  1000 ml +  UOP    Roberto Hlavaty A. Jimmye Norman, RD, LDN, [place] Registered Dietitian II Certified Diabetes Care and Education Specialist Pager: [phone] After hours Pager: [phone]

## 2018-09-03 LAB — MAGNESIUM: Magnesium: 2 mg/dL (ref 1.7–2.4)

## 2018-09-03 LAB — COMPREHENSIVE METABOLIC PANEL
ALT: 11 U/L (ref 0–44)
AST: 19 U/L (ref 15–41)
Albumin: 1.7 g/dL — ABNORMAL LOW (ref 3.5–5.0)
Alkaline Phosphatase: 68 U/L (ref 38–126)
Anion gap: 10 (ref 5–15)
BUN: 17 mg/dL (ref 8–23)
CO2: 24 mmol/L (ref 22–32)
Calcium: 7.6 mg/dL — ABNORMAL LOW (ref 8.9–10.3)
Chloride: 102 mmol/L (ref 98–111)
Creatinine, Ser: 3.21 mg/dL — ABNORMAL HIGH (ref 0.44–1.00)
GFR calc Af Amer: 15 mL/min — ABNORMAL LOW (ref >60)
GFR calc non Af Amer: 13 mL/min — ABNORMAL LOW (ref >60)
Glucose, Bld: 71 mg/dL (ref 70–99)
Potassium: 3.7 mmol/L (ref 3.5–5.1)
Sodium: 136 mmol/L (ref 135–145)
Total Bilirubin: 0.9 mg/dL (ref 0.3–1.2)
Total Protein: 4.7 g/dL — ABNORMAL LOW (ref 6.5–8.1)

## 2018-09-03 LAB — CBC WITH DIFFERENTIAL/PLATELET
Abs Immature Granulocytes: 0.01 10*3/uL (ref 0.00–0.07)
Basophils Absolute: 0.1 10*3/uL (ref 0.0–0.1)
Basophils Relative: 1 %
Eosinophils Absolute: 0 10*3/uL (ref 0.0–0.5)
Eosinophils Relative: 1 %
HCT: 39.7 % (ref 36.0–46.0)
Hemoglobin: 11.9 g/dL — ABNORMAL LOW (ref 12.0–15.0)
Immature Granulocytes: 0 %
Lymphocytes Relative: 14 %
Lymphs Abs: 0.8 10*3/uL (ref 0.7–4.0)
MCH: 28 pg (ref 26.0–34.0)
MCHC: 30 g/dL (ref 30.0–36.0)
MCV: 93.4 fL (ref 80.0–100.0)
Monocytes Absolute: 0.5 10*3/uL (ref 0.1–1.0)
Monocytes Relative: 8 %
Neutro Abs: 4 10*3/uL (ref 1.7–7.7)
Neutrophils Relative %: 76 %
Platelets: 152 10*3/uL (ref 150–400)
RBC: 4.25 MIL/uL (ref 3.87–5.11)
RDW: 16.7 % — ABNORMAL HIGH (ref 11.5–15.5)
WBC: 5.3 10*3/uL (ref 4.0–10.5)
nRBC: 0 % (ref 0.0–0.2)

## 2018-09-03 LAB — GLUCOSE, CAPILLARY
Glucose-Capillary: 55 mg/dL — ABNORMAL LOW (ref 70–99)
Glucose-Capillary: 70 mg/dL (ref 70–99)
Glucose-Capillary: 73 mg/dL (ref 70–99)
Glucose-Capillary: 74 mg/dL (ref 70–99)
Glucose-Capillary: 74 mg/dL (ref 70–99)
Glucose-Capillary: 81 mg/dL (ref 70–99)
Glucose-Capillary: 81 mg/dL (ref 70–99)

## 2018-09-03 LAB — HEPATITIS B SURFACE ANTIBODY,QUALITATIVE: Hep B S Ab: REACTIVE

## 2018-09-03 LAB — HEPATITIS B CORE ANTIBODY, TOTAL: Hep B Core Total Ab: NEGATIVE

## 2018-09-03 LAB — HEPATITIS B SURFACE ANTIGEN: Hepatitis B Surface Ag: NEGATIVE

## 2018-09-03 MED ORDER — MIDODRINE HCL 5 MG PO TABS
ORAL_TABLET | ORAL | Status: AC
  Filled 2018-09-03: qty 2

## 2018-09-03 NOTE — Progress Notes (Addendum)
   Hypoglycemic Event  CBG: 55  Treatment: 8 oz juice/soda Cranberry juice, pro stat sugar given Symptoms: Hungry weakness Follow-up CBG: [id number] CBG Result:74 Possible Reasons for Event: Inadequate meal intake Poor P O intake Comments/MD notified:Dr. Pahwani via AMION text message  Will feed pt her breakfast and reevaluate   After pt had eaten a full bowl of oatmeal  cbg rechecked at [place], Courtney [name]

## 2018-09-03 NOTE — Progress Notes (Signed)
[facility] Progress Note  Subjective: pt minimally verbal in room this am, very sleepy.  Seen by pall care team, greatly appreciate their assistance.    Vitals:   09/02/18 2359 09/03/18 0540 09/03/18 1139 09/03/18 1148  BP: (!) 98/56 126/61 122/66 127/62  Pulse: 63 70 70 68  Resp:   18 18  Temp: 97.6 F (36.4 C) 98 F (36.7 C) 97.8 F (36.6 C)   TempSrc:   Oral   SpO2: 99% 94%    Weight:   32.1 kg   Height:        Inpatient medications: . aspirin EC  81 mg Oral Daily  . atorvastatin  20 mg Oral Daily  . calcium-vitamin D  1 tablet Oral Q breakfast  . Chlorhexidine Gluconate Cloth  6 each Topical Q0600  . Chlorhexidine Gluconate Cloth  6 each Topical Q0600  . cholecalciferol  1,000 Units Oral Daily  . escitalopram  5 mg Oral Daily  . feeding supplement  1 Container Oral TID BM  . feeding supplement (PRO-STAT SUGAR FREE 64)  30 mL Oral TID WC  . lipase/protease/amylase  12,000 Units Oral TID WC  . midodrine  10 mg Oral TID WC  . multivitamin  1 tablet Oral QHS  . pantoprazole  40 mg Oral BID    acetaminophen **OR** acetaminophen, ondansetron **OR** ondansetron (ZOFRAN) IV    Exam: Gen small framed AAF no distress, very weak and debilitated +jvd Chest decreased BS L base and R side clear RRR no MRG Abd soft ntnd no mass or ascites +bs Ext asymmetric RLE> LLE edema dependent areas, +RUE edema Neuro is very weak , answers simple questions R arm AVG+bruit   Home meds:  - aspirin 81/ atrovastatin 20  - pantoprazole 40 bid  - midodrine 10 tid  - escitalopram 5 qd  - prn's/ vitamins/ supplements    Outpt HD: TTS [facility]  3h (usual runs 2h)   32.5kg   2/2.5 bath  R AVG    Assessment/ Plan: 1. SOB/ persistent L pleural effusion: pt sig vol overloaded w/ 3rd spacing due to severe malnutrition and hypoalbuminemia.  Not likely that this all is treatable.  Seen by palliative care, greatly appreciate assistance. Pt is likely dying.  Daughter is  a Marine scientist and is aware, she has joint POA w/ her brother.  Plan is for HD today then dc to cont outpt HD as she can tolerate at her unit in [place], [place].   2. ESRD: on HD TTS. HD yest and today to get back on schedule.  3. Vol excess: sig edema and L effusion, UF as tolerated, cont midodrine 4. Malnutrition/ hypoalb 5. CAD/ hx MI 6. H/o CVA   [facility] 09/03/2018, 12:42 PM  Iron/TIBC/Ferritin/ %Sat    Component Value Date/Time   IRON 111 07/31/2018 0336   TIBC 136 (L) 07/31/2018 0336   FERRITIN 1,324 (H) 07/31/2018 0336   IRONPCTSAT 82 (H) 07/31/2018 0336   Recent Labs  Lab 09/01/18 1620  09/03/18 0416  NA  --    < > 136  K  --    < > 3.7  CL  --    < > 102  CO2  --    < > 24  GLUCOSE  --    < > 71  BUN  --    < > 17  CREATININE  --    < > 3.21*  CALCIUM  --    < >  7.6*  PHOS 3.1  --   --   ALBUMIN 1.8*  --  1.7*   < > = values in this interval not displayed.   Recent Labs  Lab 09/03/18 0416  AST 19  ALT 11  ALKPHOS 68  BILITOT 0.9  PROT 4.7*   Recent Labs  Lab 09/03/18 0416  WBC 5.3  HGB 11.9*  HCT 39.7  PLT 152

## 2018-09-03 NOTE — TOC Transition Note (Signed)
Transition of Care [facility]) - CM/SW Discharge Note   Patient Details  Name: Courtney [name] MRN: [id number] Date of Birth: November 05, 1940  [facility] CM/SW Contact:  Carles Collet, RN Phone Number: 09/03/2018, 4:42 PM   Clinical Narrative:    Damaris Schooner w patient's daughter. She declined Palliative referral stating she had the number and had to decide if she wanted it w her brother and would follow up on it herself. Confirmed active w [facility] and referral placed. No DME needs. Son will provide transport home.    Final next level of care: [place] Barriers to Discharge: No Barriers Identified   Patient Goals and CMS Choice Patient states their goals for this hospitalization and ongoing recovery are:: to return home CMS Medicare.gov Compare Post Acute Care list provided to:: Other (Comment Required) Choice offered to / list presented to : Adult Children  Discharge Placement                       Discharge Plan and Services                          HH Arranged: RN, PT, OT, Nurse's Aide [place] Agency: [place] ([facility]) Date HH Agency Contacted: 09/03/18 Time [place]: [phone] Representative spoke with at [place]: [place] ([place]) Interventions     Readmission Risk Interventions No flowsheet data found.

## 2018-09-03 NOTE — Discharge Summary (Signed)
Physician Discharge Summary  KEIRSTAN IANNELLO SHF:[id number] DOB: 07-10-1940 DOA: 09/01/2018  PCP: Neale Burly, MD  Admit date: 09/01/2018 Discharge date: 09/03/2018  Admitted From: Home Disposition: Home  Recommendations for Outpatient Follow-up:  1. Follow up with PCP in 1-2 weeks 2. Please obtain BMP/CBC in one week 3. Please follow up on the following pending results:  Home Health: Yes Equipment/Devices: None  Discharge Condition: Fair/stable CODE STATUS: Full code Diet recommendation: Regular  Subjective: Seen and examined.  She was again lethargic at her baseline.  She had no complaints.  Brief/Interim Summary: DESHANTI ADCOX a 78 y.o.femalewith medical history significant ofStroke, ESRD on TTS dialysis, DM, on chronic midodrine, severe malnutrition. Wheelchair bound at baseline.  Patient admitted for GIB from duodenal ulcer last month, that hospitalization was complicated by CHF, pleural effusion, iatrogenic pneumothorax after drainage of pleural effusion, intubation. Reoccurrence of plural effusion but not drained secondary to no severe SOB with it and wanting to avoid a repeat of the PTX.  Patient with poor p.o. intake for past 3 to 4 days.  Reportedly, she arrived to dialysis clinic on 09/01/2018 and was observed to be having shortness of breath so she was sent to the emergency department.  Per daughter, patient has not been feeling well and has decreased energy and p.o. intake for past 2 weeks since discharge.  No other complaint.  Patient herself denied any shortness of breath and she was not hypoxic.  Upon arrival to the ED, she was saturating 95% on room air and ABG was within normal limits.  Chest x-ray showed large left-sided pleural effusion, slightly increased compared to x-ray done on 08/14/2018.  She was subsequently admitted under hospitalist service.  When I assumed patient's care yesterday, she was again very comfortable and did not have any signs of dyspnea and did not  have any shortness of breath.  I called the daughter and informed her that due to the fact that she is not uncomfortable, thoracentesis should be avoided due to recent history of pneumothorax.  I consulted nephrology for her routine dialysis who ended up talking to the family and based on their communications, daughter was this time more acceptable of the fact that patient will be a good candidate for hospice so she was agreeable to talk to palliative care again.  Palliative care was involved and they had a discussion with the daughter however she decided to continue to keep her as full code and talk to the brother (apparently the brother who is patient's son happens to be the person to make final decisions).  I talked to her again yesterday afternoon and she mentioned that her brother is not accepting the fact that patient is declining so at this point in time they decided to continue as a full code.  She wanted [place] to keep the patient in the hospital through the weekend for for observation and basically for some personal reasons.  I did explain to her that we do not have any medical reasons to keep patient here and we agreed to discharge patient today on 09/03/2018.  Patient will be discharged today after having dialysis since patient is hemodynamically stable at her baseline.  Family will continue to have discussions among themselves about her CODE STATUS and hospice and will reach out to palliative department once they come up to a final decision.  Discharge Diagnoses:  Principal Problem:   Recurrent pleural effusion on left Active Problems:   ESRD on dialysis ([facility])   Type  2 diabetes mellitus (HCC)   Pressure injury of skin   Protein-calorie malnutrition, severe   Chronic diastolic CHF (congestive heart failure) (HCC)   History of stroke   Excessive somnolence disorder   FTT (failure to thrive) in adult    Discharge Instructions  Discharge Instructions    Discharge patient   Complete by: As  directed    Please discharge patient home after dialysis today.   Discharge disposition: 06-Home-Health Care Svc   Discharge patient date: 09/03/2018     Allergies as of 09/03/2018      Reactions   Grapefruit Flavor [flavoring Agent]    Reaction unknown      Medication List    TAKE these medications   aspirin EC 81 MG tablet Take 81 mg by mouth daily.   atorvastatin 20 MG tablet Commonly known as: LIPITOR Take 20 mg by mouth daily.   calcium-vitamin D 500-200 MG-UNIT tablet Commonly known as: OSCAL WITH D Take 1 tablet by mouth.   cholecalciferol 10 MCG (400 UNIT) Tabs tablet Commonly known as: VITAMIN D3 Take 1,000 Units by mouth daily.   Creon 6000 units Cpep Generic drug: Pancrelipase (Lip-Prot-Amyl) Take 1 capsule by mouth 3 (three) times daily with meals.   escitalopram 5 MG tablet Commonly known as: LEXAPRO Take 5 mg by mouth daily.   feeding supplement (PRO-STAT SUGAR FREE 64) Liqd Take 30 mLs by mouth 3 (three) times daily with meals.   loperamide 2 MG tablet Commonly known as: IMODIUM A-D Take 2 mg by mouth every 6 (six) hours as needed for diarrhea or loose stools.   midodrine 10 MG tablet Commonly known as: PROAMATINE Take 1 tablet (10 mg total) by mouth 3 (three) times daily with meals.   multivitamin Tabs tablet Take 1 tablet by mouth at bedtime.   pantoprazole 40 MG tablet Commonly known as: PROTONIX Take 1 tablet (40 mg total) by mouth 2 (two) times daily.      Follow-up Information    Neale Burly, MD Follow up in 1 week(s).   Specialty: Internal Medicine Contact information: [place] [address] [phone]          Allergies  Allergen Reactions  . Grapefruit Flavor [Flavoring Agent]     Reaction unknown    Consultations: Palliative care/nephrology   Procedures/Studies: Ct Abdomen Pelvis Wo Contrast  Result Date: 08/07/2018 CLINICAL DATA:  Severe abdominal pain post EGD and colonoscopy, severe  anemia due to active GI bleeding, history end-stage renal disease on dialysis, coronary artery disease post MI, diabetes mellitus, hypertension, CHF, stroke EXAM: CT ABDOMEN AND PELVIS WITHOUT CONTRAST TECHNIQUE: Multidetector CT imaging of the abdomen and pelvis was performed following the standard protocol without IV contrast. Sagittal and coronal MPR images reconstructed from axial data set. Neither oral nor intravenous contrast were administered COMPARISON:  07/30/2018 FINDINGS: Lower chest: Bibasilar pleural effusions moderate RIGHT and small LEFT. LEFT basilar thoracostomy tube present with loculated pneumothorax at LEFT base. No mediastinal shift. Significant atelectasis of BILATERAL lower lobes. Hepatobiliary: Liver and gallbladder unremarkable Pancreas: Normal appearance Spleen: Normal appearance.  Small splenule inferior to spleen. Adrenals/Urinary Tract: Atrophic kidneys without gross mass or hydronephrosis. Adrenal glands unremarkable. Ureters not visualized. Bladder [place] appears mildly thickened though bladder contains minimal urine and is underdistended. Stomach/Bowel: Stomach and bowel loops poorly assessed due to lack of IV and oral contrast. Gastric [place] appears thickened though stomach is underdistended. No gross bowel dilatation or obvious [place] thickening. Vascular/Lymphatic: Extensive atherosclerotic calcifications  of both large and small vessels. Aorta normal caliber. No gross adenopathy. Enlargement of cardiac chambers. No significant pericardial effusion. Reproductive: Unremarkable uterus.  Nonvisualization of ovaries. Other: Diffuse soft tissue edema throughout body Gal[place] and intra-abdominal/intrapelvic tissue planes. Scattered free fluid. No free air. No hernia. Musculoskeletal: Bones markedly demineralized. Orthopedic hardware proximal RIGHT femur. IMPRESSION: Extensive diffuse soft tissue edema and scattered ascites. Moderate RIGHT and small LEFT pleural effusions with bibasilar  atelectasis. No definite acute intra-abdominal or intrapelvic abnormalities otherwise identified within limitations of exam. Loculated pneumothorax at LEFT lung base despite chest tube. Findings called to Dr.  Roger Shelter on 08/07/2018 at 1522 hours. Electronically Signed   By: Lavonia Dana M.D.   On: 08/07/2018 15:22   Dg Chest 2 View  Result Date: 09/01/2018 CLINICAL DATA:  Acute shortness of breath EXAM: CHEST - 2 VIEW COMPARISON:  08/14/2018 FINDINGS: A large LEFT pleural effusion has slightly increased. RIGHT pleural effusion again noted. Bilateral LOWER lung atelectasis identified. Pulmonary vascular congestion is present. Cardiomediastinal silhouette does not appear significantly changed. No pneumothorax or acute bony abnormality. RIGHT axillary vascular graft again noted. IMPRESSION: Slightly increased large LEFT pleural effusion. No definite change in RIGHT pleural effusion or bilateral LOWER lung atelectasis. Pulmonary vascular congestion. Electronically Signed   By: Margarette Canada M.D.   On: 09/01/2018 16:11   Dg Abd 1 View  Result Date: 08/15/2018 CLINICAL DATA:  NG tube advancement. EXAM: ABDOMEN - 1 VIEW 9:53 a.m. COMPARISON:  08/15/2018 at 6:27 a.m. FINDINGS: The NG tube has been advanced and the tip is now in the distal stomach. Bowel gas pattern is normal. Large left pleural effusion. Small right pleural effusion. No acute bone abnormality. IMPRESSION: NG tube now in good position in the distal stomach. No other significant change. Electronically Signed   By: Lorriane Shire M.D.   On: 08/15/2018 10:35   Dg Abd 1 View  Result Date: 08/15/2018 CLINICAL DATA:  NG tube placement. EXAM: ABDOMEN - 1 VIEW COMPARISON:  08/13/2018. FINDINGS: NG tube noted with tip over the upper portion of the stomach. Side hole is noted above the gastroesophageal junction. NG tube advancement of approximately 15 cm should be considered. Nondistended air-filled loops of small bowel again noted. Colonic gas pattern is  normal. No free air. Cardiomegaly with bilateral pulmonary infiltrates/edema and bilateral pleural effusions again noted. IMPRESSION: 1. NG tube noted with tip over the upper portion of the stomach. Side hole is noted above the gastroesophageal junction. NG tube advancement of approximately 15 cm should be considered. 2.  Nondistended air-filled loops of small bowel again noted. 3. Cardiomegaly with bilateral pulmonary infiltrates/edema bilateral pleural effusions again noted. Electronically Signed   By: Marcello Moores  Register   On: 08/15/2018 07:58   Dg Abd 1 View  Result Date: 08/13/2018 CLINICAL DATA:  Nasogastric tube placement. EXAM: ABDOMEN - 1 VIEW COMPARISON:  08/11/2018 FINDINGS: Nasogastric tube is present with tip over the right mid abdomen likely within the distal stomach or proximal duodenum. Stable bibasilar opacification over the lung bases. Multiple air-filled nondilated small bowel loops present. Paucity of bowel gas within the colon. No free peritoneal air. Remainder the exam is unchanged. IMPRESSION: Nonspecific, nonobstructive bowel gas pattern with increased number of air-filled nondilated small bowel loops over the mid to lower abdomen. Nasogastric tube with tip over the right mid abdomen likely over the distal stomach or proximal duodenum. Electronically Signed   By: Marin Olp M.D.   On: 08/13/2018 09:13   Dg Abd 1 View  Result Date: 08/11/2018 CLINICAL DATA:  Evaluate nasogastric tube placement. EXAM: ABDOMEN - 1 VIEW COMPARISON:  Chest radiograph 08/10/2018 FINDINGS: Nasogastric tube extends into the abdomen and appears to extend through the stomach. The tip is in the expected region of the proximal duodenum. Surgical hardware in the right hip and proximal right femur. There is a central line tip near the lower SVC. Densities at both lung bases compatible with bilateral pleural effusions, left side greater than right. Nonobstructive bowel gas pattern. Extensive vascular calcifications  in the abdomen. IMPRESSION: Tip of the nasogastric tube is likely in the proximal duodenum. Electronically Signed   By: Markus Daft M.D.   On: 08/11/2018 11:53   Dg Abd 1 View  Result Date: 08/06/2018 CLINICAL DATA:  Reason for exam: Encounter for nasogastric (NG) tube placement EXAM: ABDOMEN - 1 VIEW COMPARISON:  Radiograph 08/04/2018 FINDINGS: NG tube extends the stomach. No dilated large or small bowel. Extensive vascular calcification noted. IMPRESSION: NG tube in stomach. Electronically Signed   By: Suzy Bouchard M.D.   On: 08/06/2018 18:47   Ct Head Wo Contrast  Result Date: 09/01/2018 CLINICAL DATA:  Altered level of consciousness (LOC), unexplained EXAM: CT HEAD WITHOUT CONTRAST TECHNIQUE: Contiguous axial images were obtained from the base of the skull through the vertex without intravenous contrast. COMPARISON:  CT head 09/08/2017 FINDINGS: Brain: No evidence of acute infarction, hemorrhage, hydrocephalus, extra-axial collection or mass lesion/mass effect. Diffuse moderate atrophy with extensive subcortical and deep white matter patchy hypoattenuation compatible with microvascular ischemic changes. Stable areas of gliosis likely reflecting prior infarct in the anterior limb left internal capsule in the right cerebellum. 40 Vascular: Atherosclerotic calcification of the carotid siphons and intradural vertebral arteries. No hyperdense vessel. Skull: Normal. Negative for fracture or focal lesion. Sinuses/Orbits: Paranasal sinuses and mastoids are predominantly clear. The orbits are unremarkable. Other: None. IMPRESSION: 1. No acute intracranial abnormality. 2. Atrophy and microvascular ischemic changes. Electronically Signed   By: MD Lovena Le   On: 09/01/2018 22:01   Nm Gi Blood Loss  Result Date: 08/06/2018 CLINICAL DATA:  GI bleed, anemia, hemoglobin 6.6 g EXAM: NUCLEAR MEDICINE GASTROINTESTINAL BLEEDING SCAN TECHNIQUE: Sequential abdominal images were obtained following intravenous  administration of Tc-60m labeled red blood cells. RADIOPHARMACEUTICALS:  25.8 mCi Tc-13m pertechnetate in-vitro labeled red cells. COMPARISON:  None Correlation: CT abdomen pelvis 07/30/2018 FINDINGS: Imaging performed for 2 hours. Normal blood pool distribution of labeled red cells. No abnormal gastrointestinal localization of tracer identified. IMPRESSION: No scintigraphic evidence of active GI bleeding. Electronically Signed   By: Lavonia Dana M.D.   On: 08/06/2018 16:43   Dg Chest Port 1 View  Result Date: 08/14/2018 CLINICAL DATA:  Dyspnea. EXAM: PORTABLE CHEST 1 VIEW COMPARISON:  08/10/2018 FINDINGS: Interval removal of left IJ central venous catheter. Placement of nasogastric tube which courses into the midline upper abdomen as tip is not visualized. Slight interval worsening moderate size left effusion likely with associated basilar atelectasis. Stable to slight worsening small right pleural effusion likely with associated basilar atelectasis. Hazy prominence of the central pulmonary vessels which may indicated mild degree of edema. Infection in the mid to lower lungs is possible. Cardiomediastinal silhouette and remainder the exam is unchanged. IMPRESSION: Worsening moderate size left effusion and stable to slight worsening small right effusion likely with associated bibasilar atelectasis. Likely mild concomitant interstitial edema. Infection in the mid to lower lungs is possible. Nasogastric tube as described. Electronically Signed   By: Marin Olp M.D.   On: 08/14/2018  09:26   Dg Chest Port 1 View  Result Date: 08/10/2018 CLINICAL DATA:  Abnormal respirations. EXAM: PORTABLE CHEST 1 VIEW COMPARISON:  One-view chest x-ray 08/09/2018 FINDINGS: The heart is enlarged. Left-sided chest tube was removed. Small apical pneumothorax remains. Left IJ line is stable. Diffuse interstitial edema is stable. Right pleural effusion and airspace disease is stable. IMPRESSION: 1. Interval removal of left-sided  chest tube without significant change in large left pleural effusion or small atypical pneumothorax. 2. Stable right pleural effusion. 3. Stable bibasilar airspace disease. 4. Moderate edema unchanged. Electronically Signed   By: San Morelle M.D.   On: 08/10/2018 07:43   Dg Chest Port 1 View  Result Date: 08/09/2018 CLINICAL DATA:  Pneumothorax EXAM: PORTABLE CHEST 1 VIEW COMPARISON:  08/09/2018 FINDINGS: The left-sided chest tube is well positioned. There is a probable trace left apical pneumothorax, better visualized on this examination secondary to patient positioning. There are persistent bibasilar airspace opacities and bilateral pleural effusions. Heart size is stable. The left-sided central venous catheter is stable. IMPRESSION: 1. Probable trace left apical pneumothorax, better visualized on this exam secondary to patient positioning. 2. Stable lines and tubes. 3. Persistent bilateral pleural effusions with adjacent airspace opacities, similar to prior study. Electronically Signed   By: Constance Holster M.D.   On: 08/09/2018 17:22   Dg Chest Port 1 View  Result Date: 08/09/2018 CLINICAL DATA:  Follow-up pneumothorax EXAM: PORTABLE CHEST 1 VIEW COMPARISON:  08/08/2018 FINDINGS: Pigtail chest tube on the left unchanged in position. No pneumothorax on the current study. Mild increase in left pleural effusion and left lower lobe consolidation Right lower lobe airspace disease and small right effusion unchanged. Central line in the SVC at the cavoatrial junction unchanged. Stent in the right axillary region. IMPRESSION: Left pneumothorax has resolved. Progression of left pleural effusion and left lower lobe airspace disease No change in right lower lobe airspace disease and right effusion. Electronically Signed   By: Franchot Gallo M.D.   On: 08/09/2018 11:25   Dg Chest Port 1 View  Result Date: 08/08/2018 CLINICAL DATA:  Left-sided pneumothorax status post chest tube placement EXAM:  PORTABLE CHEST 1 VIEW COMPARISON:  08/08/2018 FINDINGS: The left-sided chest tube and left-sided central venous catheter are both stable. There is a trace left apical pneumothorax. Bilateral pleural effusions are again noted which is stable from prior study. There is no acute osseous abnormality. A vascular stent is noted in the right axilla. The cardiac size is enlarged but stable from prior study. There is likely bibasilar atelectasis. IMPRESSION: 1. Stable lines and tubes. 2. Trace left apical pneumothorax. 3. Persistent bilateral pleural effusions, right greater than left. Electronically Signed   By: Constance Holster M.D.   On: 08/08/2018 19:21   Dg Chest Port 1 View  Result Date: 08/08/2018 CLINICAL DATA:  Left pneumothorax. EXAM: PORTABLE CHEST 1 VIEW COMPARISON:  08/07/2018 and 08/06/2018 FINDINGS: There is no visible residual left pneumothorax. There has been reaccumulation of the left base effusion. Right base effusion persist. Left chest tube and central venous catheter appear unchanged in position. Heart size and pulmonary vascularity are normal. Extensive aortic atherosclerosis. Diffuse osteopenia. IMPRESSION: 1. Resolution of the left base pneumothorax. 2. Reaccumulation of left base pleural effusion. 3. Persistent right pleural effusion. 4.  Aortic Atherosclerosis (ICD10-I70.0). Electronically Signed   By: Lorriane Shire M.D.   On: 08/08/2018 13:14   Dg Chest Port 1 View  Result Date: 08/07/2018 CLINICAL DATA:  78 y/o  F; central line placement.  EXAM: PORTABLE CHEST 1 VIEW COMPARISON:  08/07/2018 chest radiograph. FINDINGS: Left central venous catheter tip projects over the lower SVC. Left-sided pleural drain is stable in position. Stable small left-sided pneumothorax at the lung base. Vascular stent projects over the right axilla. Stable hazy opacity at the right lung base probably representing effusion and atelectasis. Stable enlarged cardiac silhouette given projection and technique.  IMPRESSION: Left central venous catheter tip projects over lower SVC. Stable small left-sided pneumothorax and pleural drain. Stable hazy opacity at right lung base probably representing effusion and atelectasis. Electronically Signed   By: Kristine Garbe M.D.   On: 08/07/2018 23:59   Dg Chest Port 1 View  Result Date: 08/07/2018 CLINICAL DATA:  Follow-up left pneumothorax. End-stage renal disease on dialysis. Congestive heart failure. EXAM: PORTABLE CHEST 1 VIEW COMPARISON:  08/06/2018 FINDINGS: Left pleural pigtail catheter remains in place. Small left apex: Basilar pneumothorax shows no significant change in size. Left lower lung pleural-parenchymal scarring again noted. Cardiomegaly stable. Worsening asymmetric airspace disease is seen throughout the right lung, and increased size of small layering right pleural effusion is noted. IMPRESSION: 1. No significant change in small left pneumothorax. Left pleural pigtail catheter remains in place. 2. Worsening asymmetric airspace disease throughout right lung, and increased small layering right pleural effusion. 3. Stable cardiomegaly. Electronically Signed   By: Earle Gell M.D.   On: 08/07/2018 05:53   Dg Chest Port 1 View  Result Date: 08/06/2018 CLINICAL DATA:  Pneumothorax EXAM: PORTABLE CHEST 1 VIEW COMPARISON:  Portable exam 1239 hours compared to 0455 hours FINDINGS: Pigtail LEFT thoracostomy tube again seen. Small pneumothorax at LEFT apex. Question loculated pneumothorax versus skin folds at inferior LEFT chest. Improving aeration at LEFT lung base. Small RIGHT pleural effusion and basilar atelectasis with remaining RIGHT lung clear. Stable heart size mediastinal contours. IMPRESSION: Small LEFT apically and question lateral LEFT basilar pneumothorax despite thoracostomy tube. Bibasilar atelectasis, with improved aeration on LEFT. Electronically Signed   By: Lavonia Dana M.D.   On: 08/06/2018 14:42   Dg Chest Port 1 View  Result Date:  08/06/2018 CLINICAL DATA:  Respiratory abnormalities EXAM: PORTABLE CHEST 1 VIEW COMPARISON:  August 05, 2018 FINDINGS: A left chest tube terminates over the left apex. The previously identified pneumothorax has resolved. Effusion and opacity in left base are mildly worsened. Probable small layering effusion on the right, similar in the interval. The cardiomediastinal silhouette is stable. No other abnormalities. IMPRESSION: 1. Left chest tube.  No pneumothorax. 2. Left effusion with underlying opacity, mildly worsened. Small right effusion is similar in the interval. Electronically Signed   By: Dorise Bullion III M.D   On: 08/06/2018 08:15   Dg Chest Port 1 View  Result Date: 08/05/2018 CLINICAL DATA:  Chest tube placement EXAM: PORTABLE CHEST 1 VIEW COMPARISON:  August 05, 2018 FINDINGS: A left-sided chest tube is been placed in the interval. The left pneumothorax is much smaller in the interval with only a small a focal component measuring 14 mm identified. Effusion and opacity is seen in the left base. Small right effusion. No other changes. IMPRESSION: 1. A left chest tube terminates near the left apex. The left-sided pneumothorax is much smaller measuring 14 mm at the apex. 2. Bilateral pleural effusions with underlying opacities. Electronically Signed   By: Dorise Bullion III M.D   On: 08/05/2018 15:45   Dg Chest Port 1 View  Result Date: 08/05/2018 CLINICAL DATA:  Status post left thoracentesis. EXAM: PORTABLE CHEST 1 VIEW COMPARISON:  08/05/2018 at 4:33 a.m. FINDINGS: There has been a significant decrease in pleural fluid on the left following thoracentesis. There is now a moderate-sized pneumothorax, likely ex vacuo, 50-60% in size. Minimal residual pleural fluid blunts the lateral left costophrenic sulcus. Stable small right pleural effusion. Stable bilateral vascular congestion. IMPRESSION: 1. Status post left thoracentesis with significant reduction in left pleural fluid, but now with a moderate  size pneumothorax, 50-60%, likely ex vacuo. Electronically Signed   By: Lajean Manes M.D.   On: 08/05/2018 14:25   Dg Chest Port 1 View  Result Date: 08/05/2018 CLINICAL DATA:  Respiratory failure. EXAM: PORTABLE CHEST 1 VIEW COMPARISON:  One-view chest x-ray 08/03/2018 FINDINGS: The patient has been extubated. The side port of the NG tube is just beyond the GE junction. Left subclavian line is stable. The heart is enlarged. Left pleural effusion and airspace disease has increased. There is progressive right perihilar and lower lobe airspace disease. A small right pleural effusion is now present. IMPRESSION: 1. Progressive left pleural effusion and airspace disease. 2. Increasing right pleural effusion and perihilar opacities. 3. Probable congestive heart failure. Electronically Signed   By: San Morelle M.D.   On: 08/05/2018 07:59   Dg Abd Portable 1v  Result Date: 08/07/2018 CLINICAL DATA:  Nasogastric tube placement. EXAM: PORTABLE ABDOMEN - 1 VIEW COMPARISON:  08/06/2018 FINDINGS: Nasogastric tube is seen with tip overlying the distal body of the stomach. The bowel gas pattern is normal. Small right basilar pneumothorax is again seen with left pleural pigtail catheter in place. IMPRESSION: Nasogastric tube tip overlies the distal body of the stomach. Electronically Signed   By: Earle Gell M.D.   On: 08/07/2018 08:01      Discharge Exam: Vitals:   09/03/18 1139 09/03/18 1148  BP: 122/66 127/62  Pulse: 70 68  Resp: 18 18  Temp: 97.8 F (36.6 C)   SpO2:     Vitals:   09/02/18 2359 09/03/18 0540 09/03/18 1139 09/03/18 1148  BP: (!) 98/56 126/61 122/66 127/62  Pulse: 63 70 70 68  Resp:   18 18  Temp: 97.6 F (36.4 C) 98 F (36.7 C) 97.8 F (36.6 C)   TempSrc:   Oral   SpO2: 99% 94%    Weight:   32.1 kg   Height:        General: Pt is alert, awake, not in acute distress Cardiovascular: RRR, S1/S2 +, no rubs, no gallops Respiratory: CTA bilaterally, no wheezing, no  rhonchi Abdominal: Soft, NT, ND, bowel sounds + Extremities: no edema, no cyanosis    The results of significant diagnostics from this hospitalization (including imaging, microbiology, ancillary and laboratory) are listed below for reference.     Microbiology: Recent Results (from the past 240 hour(s))  SARS Coronavirus 2 (CEPHEID- Performed in [place] hospital lab), Hosp Order     Status: None   Collection Time: 09/01/18  7:40 PM   Specimen: Nasopharyngeal Swab  Result Value Ref Range Status   SARS Coronavirus 2 NEGATIVE NEGATIVE Final    Comment: (NOTE) If result is NEGATIVE SARS-CoV-2 target nucleic acids are NOT DETECTED. The SARS-CoV-2 RNA is generally detectable in upper and lower  respiratory specimens during the acute phase of infection. The lowest  concentration of SARS-CoV-2 viral copies this assay can detect is 250  copies / mL. A negative result does not preclude SARS-CoV-2 infection  and should not be used as the sole basis for treatment or other  patient management decisions.  A  negative result may occur with  improper specimen collection / handling, submission of specimen other  than nasopharyngeal swab, presence of viral mutation(s) within the  areas targeted by this assay, and inadequate number of viral copies  (<250 copies / mL). A negative result must be combined with clinical  observations, patient history, and epidemiological information. If result is POSITIVE SARS-CoV-2 target nucleic acids are DETECTED. The SARS-CoV-2 RNA is generally detectable in upper and lower  respiratory specimens dur ing the acute phase of infection.  Positive  results are indicative of active infection with SARS-CoV-2.  Clinical  correlation with patient history and other diagnostic information is  necessary to determine patient infection status.  Positive results do  not rule out bacterial infection or co-infection with other viruses. If result is PRESUMPTIVE  POSTIVE SARS-CoV-2 nucleic acids MAY BE PRESENT.   A presumptive positive result was obtained on the submitted specimen  and confirmed on repeat testing.  While 2019 novel coronavirus  (SARS-CoV-2) nucleic acids may be present in the submitted sample  additional confirmatory testing may be necessary for epidemiological  and / or clinical management purposes  to differentiate between  SARS-CoV-2 and other Sarbecovirus currently known to infect humans.  If clinically indicated additional testing with an alternate test  methodology [phone]) is advised. The SARS-CoV-2 RNA is generally  detectable in upper and lower respiratory sp ecimens during the acute  phase of infection. The expected result is Negative. Fact Sheet for Patients:  StrictlyIdeas.no Fact Sheet for Healthcare Providers: BankingDealers.co.za This test is not yet approved or cleared by the [place] FDA and has been authorized for detection and/or diagnosis of SARS-CoV-2 by FDA under an Emergency Use Authorization (EUA).  This EUA will remain in effect (meaning this test can be used) for the duration of the COVID-19 declaration under Section 564(b)(1) of the Act, 21 U.S.C. section 360bbb-3(b)(1), unless the authorization is terminated or revoked sooner. Performed at [facility], [place] [address] [place], [place] [address]      Labs: BNP (last 3 results) Recent Labs    09/01/18 1620  BNP >9,390.3*   Basic Metabolic Panel: Recent Labs  Lab 09/01/18 1531 09/01/18 1620 09/01/18 1941 09/02/18 0251 09/03/18 0416  NA 134*  --  133* 136 136  K 4.6  --  4.1 4.6 3.7  CL 100  --   --  99 102  CO2 25  --   --  27 24  GLUCOSE 68*  --   --  79 71  BUN 24*  --   --  25* 17  CREATININE 4.59*  --   --  4.77* 3.21*  CALCIUM 8.0*  --   --  8.0* 7.6*  MG  --  2.2  --   --  2.0  PHOS  --  3.1  --   --   --    Liver Function Tests: Recent Labs  Lab 09/01/18 1620  09/03/18 0416  AST 27 19  ALT 13 11  ALKPHOS 76 68  BILITOT 0.7 0.9  PROT 5.3* 4.7*  ALBUMIN 1.8* 1.7*   No results for input(s): LIPASE, AMYLASE in the last 168 hours. Recent Labs  Lab 09/01/18 2050  AMMONIA 12   CBC: Recent Labs  Lab 09/01/18 1531 09/01/18 1941 09/02/18 0251 09/03/18 0416  WBC 5.4  --  5.2 5.3  NEUTROABS  --   --   --  4.0  HGB 12.4 12.9 12.8 11.9*  HCT 42.0 38.0 41.5  39.7  MCV 93.8  --  91.2 93.4  PLT 285  --  262 152   Cardiac Enzymes: No results for input(s): CKTOTAL, CKMB, CKMBINDEX, TROPONINI in the last 168 hours. BNP: Invalid input(s): POCBNP CBG: Recent Labs  Lab 09/03/18 0415 09/03/18 0818 09/03/18 0921 09/03/18 1043 09/03/18 1129  GLUCAP 73 55* 74 74 81   D-Dimer No results for input(s): DDIMER in the last 72 hours. Hgb A1c No results for input(s): HGBA1C in the last 72 hours. Lipid Profile No results for input(s): CHOL, HDL, LDLCALC, TRIG, CHOLHDL, LDLDIRECT in the last 72 hours. Thyroid function studies Recent Labs    09/01/18 2050  TSH 3.382   Anemia work up No results for input(s): VITAMINB12, FOLATE, FERRITIN, TIBC, IRON, RETICCTPCT in the last 72 hours. Urinalysis    Component Value Date/Time   COLORURINE YELLOW 09/01/2018 1857   APPEARANCEUR CLEAR 09/01/2018 1857   LABSPEC 1.012 09/01/2018 1857   PHURINE 6.0 09/01/2018 1857   GLUCOSEU NEGATIVE 09/01/2018 1857   HGBUR NEGATIVE 09/01/2018 1857   BILIRUBINUR NEGATIVE 09/01/2018 1857   KETONESUR NEGATIVE 09/01/2018 1857   PROTEINUR 100 (A) 09/01/2018 1857   UROBILINOGEN 0.2 07/17/2012 1751   NITRITE NEGATIVE 09/01/2018 1857   LEUKOCYTESUR TRACE (A) 09/01/2018 1857   Sepsis Labs Invalid input(s): PROCALCITONIN,  WBC,  LACTICIDVEN Microbiology Recent Results (from the past 240 hour(s))  SARS Coronavirus 2 (CEPHEID- Performed in [place] hospital lab), Hosp Order     Status: None   Collection Time: 09/01/18  7:40 PM   Specimen: Nasopharyngeal Swab  Result  Value Ref Range Status   SARS Coronavirus 2 NEGATIVE NEGATIVE Final    Comment: (NOTE) If result is NEGATIVE SARS-CoV-2 target nucleic acids are NOT DETECTED. The SARS-CoV-2 RNA is generally detectable in upper and lower  respiratory specimens during the acute phase of infection. The lowest  concentration of SARS-CoV-2 viral copies this assay can detect is 250  copies / mL. A negative result does not preclude SARS-CoV-2 infection  and should not be used as the sole basis for treatment or other  patient management decisions.  A negative result may occur with  improper specimen collection / handling, submission of specimen other  than nasopharyngeal swab, presence of viral mutation(s) within the  areas targeted by this assay, and inadequate number of viral copies  (<250 copies / mL). A negative result must be combined with clinical  observations, patient history, and epidemiological information. If result is POSITIVE SARS-CoV-2 target nucleic acids are DETECTED. The SARS-CoV-2 RNA is generally detectable in upper and lower  respiratory specimens dur ing the acute phase of infection.  Positive  results are indicative of active infection with SARS-CoV-2.  Clinical  correlation with patient history and other diagnostic information is  necessary to determine patient infection status.  Positive results do  not rule out bacterial infection or co-infection with other viruses. If result is PRESUMPTIVE POSTIVE SARS-CoV-2 nucleic acids MAY BE PRESENT.   A presumptive positive result was obtained on the submitted specimen  and confirmed on repeat testing.  While 2019 novel coronavirus  (SARS-CoV-2) nucleic acids may be present in the submitted sample  additional confirmatory testing may be necessary for epidemiological  and / or clinical management purposes  to differentiate between  SARS-CoV-2 and other Sarbecovirus currently known to infect humans.  If clinically indicated additional testing  with an alternate test  methodology [phone]) is advised. The SARS-CoV-2 RNA is generally  detectable in upper and lower respiratory sp ecimens  during the acute  phase of infection. The expected result is Negative. Fact Sheet for Patients:  StrictlyIdeas.no Fact Sheet for Healthcare Providers: BankingDealers.co.za This test is not yet approved or cleared by the [place] FDA and has been authorized for detection and/or diagnosis of SARS-CoV-2 by FDA under an Emergency Use Authorization (EUA).  This EUA will remain in effect (meaning this test can be used) for the duration of the COVID-19 declaration under Section 564(b)(1) of the Act, 21 U.S.C. section 360bbb-3(b)(1), unless the authorization is terminated or revoked sooner. Performed at [facility], [place] [address], [place], [place] [address]      Time coordinating discharge: Over 30 minutes  SIGNED:   Darliss Cheney, MD  [facility] 09/03/2018, 12:24 PM Pager [phone]  If 7PM-7AM, please contact night-coverage [email or web address] Password [email or web address]

## 2018-09-06 DIAGNOSIS — Z992 Dependence on renal dialysis: Secondary | ICD-10-CM | POA: Diagnosis not present

## 2018-09-06 DIAGNOSIS — N2581 Secondary hyperparathyroidism of renal origin: Secondary | ICD-10-CM | POA: Diagnosis not present

## 2018-09-06 DIAGNOSIS — D631 Anemia in chronic kidney disease: Secondary | ICD-10-CM | POA: Diagnosis not present

## 2018-09-06 DIAGNOSIS — N186 End stage renal disease: Secondary | ICD-10-CM | POA: Diagnosis not present

## 2018-09-06 DIAGNOSIS — D509 Iron deficiency anemia, unspecified: Secondary | ICD-10-CM | POA: Diagnosis not present

## 2018-09-07 DIAGNOSIS — I5022 Chronic systolic (congestive) heart failure: Secondary | ICD-10-CM | POA: Diagnosis not present

## 2018-09-08 DIAGNOSIS — N186 End stage renal disease: Secondary | ICD-10-CM | POA: Diagnosis not present

## 2018-09-08 DIAGNOSIS — Z992 Dependence on renal dialysis: Secondary | ICD-10-CM | POA: Diagnosis not present

## 2018-09-08 DIAGNOSIS — D509 Iron deficiency anemia, unspecified: Secondary | ICD-10-CM | POA: Diagnosis not present

## 2018-09-08 DIAGNOSIS — N2581 Secondary hyperparathyroidism of renal origin: Secondary | ICD-10-CM | POA: Diagnosis not present

## 2018-09-08 DIAGNOSIS — D631 Anemia in chronic kidney disease: Secondary | ICD-10-CM | POA: Diagnosis not present

## 2018-09-09 DIAGNOSIS — E1122 Type 2 diabetes mellitus with diabetic chronic kidney disease: Secondary | ICD-10-CM | POA: Diagnosis not present

## 2018-09-09 DIAGNOSIS — D649 Anemia, unspecified: Secondary | ICD-10-CM | POA: Diagnosis not present

## 2018-09-09 DIAGNOSIS — Z992 Dependence on renal dialysis: Secondary | ICD-10-CM | POA: Diagnosis not present

## 2018-09-09 DIAGNOSIS — I2510 Atherosclerotic heart disease of native coronary artery without angina pectoris: Secondary | ICD-10-CM | POA: Diagnosis not present

## 2018-09-09 DIAGNOSIS — N186 End stage renal disease: Secondary | ICD-10-CM | POA: Diagnosis not present

## 2018-09-09 DIAGNOSIS — E46 Unspecified protein-calorie malnutrition: Secondary | ICD-10-CM | POA: Diagnosis not present

## 2018-09-10 DIAGNOSIS — N186 End stage renal disease: Secondary | ICD-10-CM | POA: Diagnosis not present

## 2018-09-10 DIAGNOSIS — D509 Iron deficiency anemia, unspecified: Secondary | ICD-10-CM | POA: Diagnosis not present

## 2018-09-10 DIAGNOSIS — D631 Anemia in chronic kidney disease: Secondary | ICD-10-CM | POA: Diagnosis not present

## 2018-09-10 DIAGNOSIS — Z992 Dependence on renal dialysis: Secondary | ICD-10-CM | POA: Diagnosis not present

## 2018-09-10 DIAGNOSIS — N2581 Secondary hyperparathyroidism of renal origin: Secondary | ICD-10-CM | POA: Diagnosis not present

## 2018-09-12 DIAGNOSIS — E1122 Type 2 diabetes mellitus with diabetic chronic kidney disease: Secondary | ICD-10-CM | POA: Diagnosis not present

## 2018-09-12 DIAGNOSIS — I2510 Atherosclerotic heart disease of native coronary artery without angina pectoris: Secondary | ICD-10-CM | POA: Diagnosis not present

## 2018-09-12 DIAGNOSIS — N186 End stage renal disease: Secondary | ICD-10-CM | POA: Diagnosis not present

## 2018-09-12 DIAGNOSIS — Z992 Dependence on renal dialysis: Secondary | ICD-10-CM | POA: Diagnosis not present

## 2018-09-12 DIAGNOSIS — E46 Unspecified protein-calorie malnutrition: Secondary | ICD-10-CM | POA: Diagnosis not present

## 2018-09-12 DIAGNOSIS — D649 Anemia, unspecified: Secondary | ICD-10-CM | POA: Diagnosis not present

## 2018-09-13 DIAGNOSIS — D631 Anemia in chronic kidney disease: Secondary | ICD-10-CM | POA: Diagnosis not present

## 2018-09-13 DIAGNOSIS — N2581 Secondary hyperparathyroidism of renal origin: Secondary | ICD-10-CM | POA: Diagnosis not present

## 2018-09-13 DIAGNOSIS — N186 End stage renal disease: Secondary | ICD-10-CM | POA: Diagnosis not present

## 2018-09-13 DIAGNOSIS — Z992 Dependence on renal dialysis: Secondary | ICD-10-CM | POA: Diagnosis not present

## 2018-09-13 DIAGNOSIS — D509 Iron deficiency anemia, unspecified: Secondary | ICD-10-CM | POA: Diagnosis not present

## 2018-09-14 DIAGNOSIS — D649 Anemia, unspecified: Secondary | ICD-10-CM | POA: Diagnosis not present

## 2018-09-14 DIAGNOSIS — E46 Unspecified protein-calorie malnutrition: Secondary | ICD-10-CM | POA: Diagnosis not present

## 2018-09-14 DIAGNOSIS — Z992 Dependence on renal dialysis: Secondary | ICD-10-CM | POA: Diagnosis not present

## 2018-09-14 DIAGNOSIS — I2510 Atherosclerotic heart disease of native coronary artery without angina pectoris: Secondary | ICD-10-CM | POA: Diagnosis not present

## 2018-09-14 DIAGNOSIS — N186 End stage renal disease: Secondary | ICD-10-CM | POA: Diagnosis not present

## 2018-09-14 DIAGNOSIS — E1122 Type 2 diabetes mellitus with diabetic chronic kidney disease: Secondary | ICD-10-CM | POA: Diagnosis not present

## 2018-09-15 DIAGNOSIS — N186 End stage renal disease: Secondary | ICD-10-CM | POA: Diagnosis not present

## 2018-09-15 DIAGNOSIS — N2581 Secondary hyperparathyroidism of renal origin: Secondary | ICD-10-CM | POA: Diagnosis not present

## 2018-09-15 DIAGNOSIS — D631 Anemia in chronic kidney disease: Secondary | ICD-10-CM | POA: Diagnosis not present

## 2018-09-15 DIAGNOSIS — Z992 Dependence on renal dialysis: Secondary | ICD-10-CM | POA: Diagnosis not present

## 2018-09-15 DIAGNOSIS — D509 Iron deficiency anemia, unspecified: Secondary | ICD-10-CM | POA: Diagnosis not present

## 2018-09-17 DIAGNOSIS — N2581 Secondary hyperparathyroidism of renal origin: Secondary | ICD-10-CM | POA: Diagnosis not present

## 2018-09-17 DIAGNOSIS — D509 Iron deficiency anemia, unspecified: Secondary | ICD-10-CM | POA: Diagnosis not present

## 2018-09-17 DIAGNOSIS — Z992 Dependence on renal dialysis: Secondary | ICD-10-CM | POA: Diagnosis not present

## 2018-09-17 DIAGNOSIS — N186 End stage renal disease: Secondary | ICD-10-CM | POA: Diagnosis not present

## 2018-09-17 DIAGNOSIS — D631 Anemia in chronic kidney disease: Secondary | ICD-10-CM | POA: Diagnosis not present

## 2018-09-19 DIAGNOSIS — D649 Anemia, unspecified: Secondary | ICD-10-CM | POA: Diagnosis not present

## 2018-09-19 DIAGNOSIS — E1122 Type 2 diabetes mellitus with diabetic chronic kidney disease: Secondary | ICD-10-CM | POA: Diagnosis not present

## 2018-09-19 DIAGNOSIS — E46 Unspecified protein-calorie malnutrition: Secondary | ICD-10-CM | POA: Diagnosis not present

## 2018-09-19 DIAGNOSIS — Z992 Dependence on renal dialysis: Secondary | ICD-10-CM | POA: Diagnosis not present

## 2018-09-19 DIAGNOSIS — N186 End stage renal disease: Secondary | ICD-10-CM | POA: Diagnosis not present

## 2018-09-19 DIAGNOSIS — I2510 Atherosclerotic heart disease of native coronary artery without angina pectoris: Secondary | ICD-10-CM | POA: Diagnosis not present

## 2018-09-20 ENCOUNTER — Encounter (HOSPITAL_COMMUNITY): Payer: Self-pay | Admitting: *Deleted

## 2018-09-20 ENCOUNTER — Emergency Department (HOSPITAL_COMMUNITY)
Admission: EM | Admit: 2018-09-20 | Discharge: 2018-09-20 | Disposition: A | Discharge status: Home / Self Care | Payer: Medicare Other | Attending: Emergency Medicine | Admitting: Emergency Medicine

## 2018-09-20 ENCOUNTER — Other Ambulatory Visit: Payer: Self-pay

## 2018-09-20 DIAGNOSIS — Z8673 Personal history of transient ischemic attack (TIA), and cerebral infarction without residual deficits: Secondary | ICD-10-CM | POA: Diagnosis not present

## 2018-09-20 DIAGNOSIS — Z992 Dependence on renal dialysis: Secondary | ICD-10-CM | POA: Diagnosis not present

## 2018-09-20 DIAGNOSIS — F0390 Unspecified dementia without behavioral disturbance: Secondary | ICD-10-CM | POA: Insufficient documentation

## 2018-09-20 DIAGNOSIS — I5032 Chronic diastolic (congestive) heart failure: Secondary | ICD-10-CM | POA: Insufficient documentation

## 2018-09-20 DIAGNOSIS — I132 Hypertensive heart and chronic kidney disease with heart failure and with stage 5 chronic kidney disease, or end stage renal disease: Secondary | ICD-10-CM | POA: Insufficient documentation

## 2018-09-20 DIAGNOSIS — Z79899 Other long term (current) drug therapy: Secondary | ICD-10-CM | POA: Insufficient documentation

## 2018-09-20 DIAGNOSIS — I120 Hypertensive chronic kidney disease with stage 5 chronic kidney disease or end stage renal disease: Secondary | ICD-10-CM | POA: Diagnosis not present

## 2018-09-20 DIAGNOSIS — E1122 Type 2 diabetes mellitus with diabetic chronic kidney disease: Secondary | ICD-10-CM | POA: Insufficient documentation

## 2018-09-20 DIAGNOSIS — R1310 Dysphagia, unspecified: Secondary | ICD-10-CM | POA: Diagnosis not present

## 2018-09-20 DIAGNOSIS — I110 Hypertensive heart disease with heart failure: Secondary | ICD-10-CM | POA: Diagnosis not present

## 2018-09-20 DIAGNOSIS — D631 Anemia in chronic kidney disease: Secondary | ICD-10-CM | POA: Diagnosis not present

## 2018-09-20 DIAGNOSIS — N186 End stage renal disease: Secondary | ICD-10-CM | POA: Diagnosis not present

## 2018-09-20 DIAGNOSIS — R531 Weakness: Secondary | ICD-10-CM | POA: Insufficient documentation

## 2018-09-20 DIAGNOSIS — I252 Old myocardial infarction: Secondary | ICD-10-CM | POA: Diagnosis not present

## 2018-09-20 DIAGNOSIS — L89152 Pressure ulcer of sacral region, stage 2: Secondary | ICD-10-CM | POA: Diagnosis not present

## 2018-09-20 DIAGNOSIS — N2581 Secondary hyperparathyroidism of renal origin: Secondary | ICD-10-CM | POA: Diagnosis not present

## 2018-09-20 DIAGNOSIS — D649 Anemia, unspecified: Secondary | ICD-10-CM | POA: Diagnosis not present

## 2018-09-20 DIAGNOSIS — L89312 Pressure ulcer of right buttock, stage 2: Secondary | ICD-10-CM | POA: Diagnosis not present

## 2018-09-20 DIAGNOSIS — J449 Chronic obstructive pulmonary disease, unspecified: Secondary | ICD-10-CM | POA: Diagnosis not present

## 2018-09-20 DIAGNOSIS — Z4800 Encounter for change or removal of nonsurgical wound dressing: Secondary | ICD-10-CM | POA: Diagnosis not present

## 2018-09-20 DIAGNOSIS — I2510 Atherosclerotic heart disease of native coronary artery without angina pectoris: Secondary | ICD-10-CM | POA: Diagnosis not present

## 2018-09-20 DIAGNOSIS — I69351 Hemiplegia and hemiparesis following cerebral infarction affecting right dominant side: Secondary | ICD-10-CM | POA: Diagnosis not present

## 2018-09-20 DIAGNOSIS — D509 Iron deficiency anemia, unspecified: Secondary | ICD-10-CM | POA: Diagnosis not present

## 2018-09-20 DIAGNOSIS — E46 Unspecified protein-calorie malnutrition: Secondary | ICD-10-CM | POA: Diagnosis not present

## 2018-09-20 LAB — BASIC METABOLIC PANEL
Anion gap: 9 (ref 5–15)
BUN: 14 mg/dL (ref 8–23)
CO2: 23 mmol/L (ref 22–32)
Calcium: 7.6 mg/dL — ABNORMAL LOW (ref 8.9–10.3)
Chloride: 101 mmol/L (ref 98–111)
Creatinine, Ser: 3.87 mg/dL — ABNORMAL HIGH (ref 0.44–1.00)
GFR calc Af Amer: 12 mL/min — ABNORMAL LOW (ref >60)
GFR calc non Af Amer: 11 mL/min — ABNORMAL LOW (ref >60)
Glucose, Bld: 88 mg/dL (ref 70–99)
Potassium: 3.6 mmol/L (ref 3.5–5.1)
Sodium: 133 mmol/L — ABNORMAL LOW (ref 135–145)

## 2018-09-20 LAB — CBC
HCT: 38 % (ref 36.0–46.0)
Hemoglobin: 11.7 g/dL — ABNORMAL LOW (ref 12.0–15.0)
MCH: 27 pg (ref 26.0–34.0)
MCHC: 30.8 g/dL (ref 30.0–36.0)
MCV: 87.6 fL (ref 80.0–100.0)
Platelets: 165 10*3/uL (ref 150–400)
RBC: 4.34 MIL/uL (ref 3.87–5.11)
RDW: 17.9 % — ABNORMAL HIGH (ref 11.5–15.5)
WBC: 6.8 10*3/uL (ref 4.0–10.5)
nRBC: 0 % (ref 0.0–0.2)

## 2018-09-20 MED ORDER — SODIUM CHLORIDE 0.9% FLUSH: 3.0000 mL | Freq: Once | INTRAVENOUS | Status: DC

## 2018-09-20 NOTE — Discharge Instructions (Addendum)
Labs are normal Please continue home meds  Return if worse at any time

## 2018-09-20 NOTE — ED Provider Notes (Signed)
[facility] EMERGENCY DEPARTMENT Provider Note   CSN: [id number] Arrival date & time: 09/20/18  1622     History   Chief Complaint Chief Complaint  Patient presents with  . Weakness  . Edema    HPI Courtney [name] is a 78 y.o. female.    Level 5 caveat secondary to baseline dementia HPI  78 year old female end-stage renal disease on dialysis, history of stroke, wheelchair-bound, presents today with complaints of generalized pain per her son.  She is unable to give me history.  Son states that she is at her baseline mental status.  He reports that she went to dialysis today but did not complete it because she complained of pain at the site of her decubitus ulcers does she want to go home.  After she got home she has complained of some intermittent generalized pain.  He has not noted any other changes from baseline.  He denies that she has complained of headache, chest pain, dyspnea, nausea, vomiting, or diarrhea. He does feel that she has some increasing edema of her hands and feet. Past Medical History:  Diagnosis Date  . Anemia 07/2018  . CHF (congestive heart failure) ([place])   . Coronary artery disease   . Diabetes mellitus without complication ([place])   . ESRD on dialysis ([place]) 02/10/2017  . Hyperlipidemia   . Hypertension   . Myocardial infarct ([place])   . Stroke [facility])    2-3 yrs ago- right sided weakness    Patient Active Problem List   Diagnosis Date Noted  . FTT (failure to thrive) in adult   . Recurrent pleural effusion on left 09/01/2018  . Chronic diastolic CHF (congestive heart failure) ([place]) 09/01/2018  . History of stroke 09/01/2018  . Excessive somnolence disorder 09/01/2018  . Goals of care, counseling/discussion   . Palliative care by specialist   . Protein-calorie malnutrition, severe 08/09/2018  . Hypotensive episode 08/07/2018  . GI bleed 08/07/2018  . Shock circulatory ([place])   . S/P thoracentesis   . Pneumothorax on left   . Pressure  injury of skin 08/04/2018  . Symptomatic anemia 07/30/2018  . Acute CVA (cerebrovascular accident) ([place]) 07/13/2017  . Hypokalemia 07/13/2017  . Anemia in ESRD (end-stage renal disease) ([place]) 07/13/2017  . Hyperlipidemia 07/13/2017  . Coronary artery disease 07/13/2017  . Type 2 diabetes mellitus ([place]) 07/13/2017  . ESRD on dialysis ([place]) 02/10/2017  . Intertrochanteric fracture of right hip ([place]) 08/16/2012  . Syncope 07/17/2012  . Closed right hip fracture ([place]) 07/17/2012  . HTN (hypertension) 07/17/2012  . Cerebrovascular disease 07/17/2012    Past Surgical History:  Procedure Laterality Date  . APPENDECTOMY    . AV FISTULA PLACEMENT Right 09/20/2015   Procedure: PLACEMENT OF RIGHT UPPER EXTREMITY ARTERIOVENOUS GORE-TEX GRAFT FOR HEMODIALYSIS ACCESS;  Surgeon: Vickie Epley, MD;  Location: [facility];  Service: Vascular;  Laterality: Right;  . CATARACT EXTRACTION W/PHACO Left 06/29/2013   Procedure: CATARACT EXTRACTION PHACO AND INTRAOCULAR LENS PLACEMENT (IOC);  Surgeon: Tonny Branch, MD;  Location: [facility];  Service: Ophthalmology;  Laterality: Left;  CDE 12.25  . CATARACT EXTRACTION W/PHACO Right 07/27/2013   Procedure: CATARACT EXTRACTION PHACO AND INTRAOCULAR LENS PLACEMENT (IOC);  Surgeon: Tonny Branch, MD;  Location: [facility];  Service: Ophthalmology;  Laterality: Right;  CDE:7.72  . COLONOSCOPY WITH PROPOFOL N/A 08/07/2018   Procedure: COLONOSCOPY WITH PROPOFOL;  Surgeon: Ronnette Juniper, MD;  Location: [facility];  Service: Gastroenterology;  Laterality: N/A;  . ESOPHAGOGASTRODUODENOSCOPY N/A 08/03/2018  Procedure: ESOPHAGOGASTRODUODENOSCOPY (EGD);  Surgeon: Clarene Essex, MD;  Location: [facility];  Service: Endoscopy;  Laterality: N/A;  . ESOPHAGOGASTRODUODENOSCOPY (EGD) WITH PROPOFOL N/A 08/07/2018   Procedure: ESOPHAGOGASTRODUODENOSCOPY (EGD) WITH PROPOFOL;  Surgeon: Ronnette Juniper, MD;  Location: [facility];  Service: Gastroenterology;  Laterality: N/A;  . GIVENS CAPSULE STUDY N/A  08/07/2018   Procedure: GIVENS CAPSULE STUDY;  Surgeon: Ronnette Juniper, MD;  Location: [place] [facility] [place];  Service: Gastroenterology;  Laterality: N/A;  . HEMOSTASIS CLIP PLACEMENT  08/07/2018   Procedure: HEMOSTASIS CLIP PLACEMENT;  Surgeon: Ronnette Juniper, MD;  Location: [facility];  Service: Gastroenterology;;  . HOT HEMOSTASIS N/A 08/03/2018   Procedure: HOT HEMOSTASIS (ARGON PLASMA COAGULATION/BICAP);  Surgeon: Clarene Essex, MD;  Location: [facility];  Service: Endoscopy;  Laterality: N/A;  . INSERTION OF DIALYSIS CATHETER Right 09/11/2015   Procedure: INSERTION OF TUNNELED DIALYSIS CATHETER;  Surgeon: Vickie Epley, MD;  Location: [facility];  Service: Vascular;  Laterality: Right;  . INTRAMEDULLARY (IM) NAIL INTERTROCHANTERIC Right 07/20/2012   Procedure: INTRAMEDULLARY (IM) NAIL INTERTROCHANTRIC;  Surgeon: Carole Civil, MD;  Location: [facility];  Service: Orthopedics;  Laterality: Right;  . IR AV DIALY SHUNT INTRO NEEDLE/INTRAC INITIAL W/PTA/STENT/IMG RT Right 07/20/2018  . IR GENERIC HISTORICAL  09/25/2015   IR US GUIDE VASC ACCESS RIGHT 09/25/2015 Sandi Mariscal, MD [facility]  . IR GENERIC HISTORICAL  09/25/2015   IR FLUORO GUIDE CV LINE RIGHT 09/25/2015 Sandi Mariscal, MD [facility]  . IR GENERIC HISTORICAL  09/25/2015   IR REMOVAL TUN CV CATH W/O FL 09/25/2015 Sandi Mariscal, MD [facility]  . IR THROMBECTOMY AV FISTULA W/THROMBOLYSIS/PTA INC/SHUNT/IMG RIGHT Right 09/06/2017  . IR THROMBECTOMY AV FISTULA W/THROMBOLYSIS/PTA INC/SHUNT/IMG RIGHT Right 09/29/2017  . IR THROMBECTOMY AV FISTULA W/THROMBOLYSIS/PTA INC/SHUNT/IMG RIGHT Right 07/20/2018  . IR US GUIDE VASC ACCESS RIGHT  09/06/2017  . IR US GUIDE VASC ACCESS RIGHT  09/29/2017  . IR US GUIDE VASC ACCESS RIGHT  07/20/2018  . IR US GUIDE VASC ACCESS RIGHT  07/20/2018  . SCLEROTHERAPY  08/03/2018   Procedure: Clide Deutscher;  Surgeon: Clarene Essex, MD;  Location: [facility] ENDOSCOPY;  Service: Endoscopy;;     OB History   No obstetric history on file.      Home  Medications    Prior to Admission medications   Medication Sig Start Date End Date Taking? Authorizing Provider  Amino Acids-Protein Hydrolys (FEEDING SUPPLEMENT, PRO-STAT SUGAR FREE 64,) LIQD Take 30 mLs by mouth 3 (three) times daily with meals. 08/19/18   Cristal Ford, DO  aspirin EC 81 MG tablet Take 81 mg by mouth daily.    [provider]  atorvastatin (LIPITOR) 20 MG tablet Take 20 mg by mouth daily.    [provider]  calcium-vitamin D (OSCAL WITH D) 500-200 MG-UNIT tablet Take 1 tablet by mouth.    [provider]  cholecalciferol (VITAMIN D) 400 units TABS tablet Take 1,000 Units by mouth daily.     [provider]  escitalopram (LEXAPRO) 5 MG tablet Take 5 mg by mouth daily.    [provider]  INTRALIPID 20 % EMUL infusion  09/16/18   [provider]  loperamide (IMODIUM A-D) 2 MG tablet Take 2 mg by mouth every 6 (six) hours as needed for diarrhea or loose stools.    [provider]  midodrine (PROAMATINE) 10 MG tablet Take 1 tablet (10 mg total) by mouth 3 (three) times daily with meals. 08/19/18   Cristal Ford, DO  multivitamin (RENA-VIT) TABS tablet  Take 1 tablet by mouth at bedtime. 08/19/18   Mikhail, Velta Addison, DO  Pancrelipase, Lip-Prot-Amyl, (CREON) 6000 units CPEP Take 1 capsule by mouth 3 (three) times daily with meals. 09/27/17   [provider]  pantoprazole (PROTONIX) 40 MG tablet Take 1 tablet (40 mg total) by mouth 2 (two) times daily. 08/19/18   Cristal Ford, DO    Family History Family History  Problem Relation Age of Onset  . Heart disease Mother   . Heart disease Father     Social History Social History   Tobacco Use  . Smoking status: Never Smoker  . Smokeless tobacco: Never Used  Substance Use Topics  . Alcohol use: No  . Drug use: No     Allergies   Grapefruit flavor [flavoring agent]   Review of Systems Review of Systems  Unable to perform ROS: Dementia  All  other systems reviewed and are negative.    Physical Exam Updated Vital Signs BP 98/76   Pulse 71   Temp (!) 97.5 F (36.4 C) (Oral)   Resp 18   SpO2 100%   Physical Exam Vitals signs reviewed.  Constitutional:      General: She is not in acute distress.    Appearance: She is ill-appearing.  HENT:     Head: Normocephalic and atraumatic.     Right Ear: External ear normal.     Left Ear: External ear normal.     Nose: Nose normal.     Mouth/Throat:     Mouth: Mucous membranes are dry.  Eyes:     Pupils: Pupils are equal, round, and reactive to light.  Neck:     Musculoskeletal: Normal range of motion.  Cardiovascular:     Rate and Rhythm: Normal rate and regular rhythm.     Pulses: Normal pulses.  Pulmonary:     Effort: Pulmonary effort is normal.     Breath sounds: Normal breath sounds.  Abdominal:     General: Abdomen is flat.     Palpations: Abdomen is soft.  Musculoskeletal:     Comments: Atrophy bilateral lower legs  Skin:    General: Skin is warm and dry.     Capillary Refill: Capillary refill takes less than 2 seconds.  Neurological:     General: No focal deficit present.     Mental Status: She is alert.      ED Treatments / Results  Labs (all labs ordered are listed, but only abnormal results are displayed) Labs Reviewed  CBC - Abnormal; Notable for the following components:      Result Value   Hemoglobin 11.7 (*)    RDW 17.9 (*)    All other components within normal limits  BASIC METABOLIC PANEL  URINALYSIS, ROUTINE W REFLEX MICROSCOPIC  CBG MONITORING, ED    EKG EKG Interpretation  Date/Time:  Tuesday September 20 2018 23:13:27 EDT Ventricular Rate:  56 PR Interval:    QRS Duration: 122 QT Interval:  524 QTC Calculation: 506 R Axis:   82 Text Interpretation:  Sinus rhythm Nonspecific intraventricular conduction delay twave inversion v1-v3 No significant change since last tracing Confirmed by Pattricia Boss [phone]) on 09/20/2018 11:17:16 PM    Radiology No results found.  Procedures Procedures (including critical care time)  Medications Ordered in ED Medications  sodium chloride flush (NS) 0.9 % injection 3 mL (has no administration in time range)     Initial Impression / Assessment and Plan / ED Course  I have reviewed the triage  vital signs and the nursing notes.  Pertinent labs & imaging results that were available during my care of the patient were reviewed by me and considered in my medical decision making (see chart for details).   78 yo female with esrd on dialysis with increased generalized weakness Work up here normal vitals (temperature rechecked by me and 97.9 orally).   Mild skin break down sacrum No s/s infection on exam Creatinine elevated consistent with esrd OW normal labs        Final Clinical Impressions(s) / ED Diagnoses   Final diagnoses:  Generalized weakness  ESRD (end stage renal disease) [facility]    ED Discharge Orders    None       Pattricia Boss, MD 09/20/18 2327

## 2018-09-20 NOTE — ED Notes (Signed)
Patient's guardian verbalized understanding of dc instructions, vss, pt taken out via wheelchair by her guardian at discharge.

## 2018-09-20 NOTE — ED Triage Notes (Signed)
Family reports that pt went for dialysis today but did not complete her treatment. Pt has reported generalized pain and back pain today. Has swelling to her hands. Pt is sleeping on arrival but easily arousable. Has no complaints that this time. Airway intact

## 2018-09-22 ENCOUNTER — Other Ambulatory Visit: Payer: Self-pay

## 2018-09-22 DIAGNOSIS — N186 End stage renal disease: Secondary | ICD-10-CM | POA: Diagnosis not present

## 2018-09-22 DIAGNOSIS — D509 Iron deficiency anemia, unspecified: Secondary | ICD-10-CM | POA: Diagnosis not present

## 2018-09-22 DIAGNOSIS — N2581 Secondary hyperparathyroidism of renal origin: Secondary | ICD-10-CM | POA: Diagnosis not present

## 2018-09-22 DIAGNOSIS — Z992 Dependence on renal dialysis: Secondary | ICD-10-CM | POA: Diagnosis not present

## 2018-09-22 DIAGNOSIS — D631 Anemia in chronic kidney disease: Secondary | ICD-10-CM | POA: Diagnosis not present

## 2018-09-23 DIAGNOSIS — I2510 Atherosclerotic heart disease of native coronary artery without angina pectoris: Secondary | ICD-10-CM | POA: Diagnosis not present

## 2018-09-23 DIAGNOSIS — E46 Unspecified protein-calorie malnutrition: Secondary | ICD-10-CM | POA: Diagnosis not present

## 2018-09-23 DIAGNOSIS — E1122 Type 2 diabetes mellitus with diabetic chronic kidney disease: Secondary | ICD-10-CM | POA: Diagnosis not present

## 2018-09-23 DIAGNOSIS — Z992 Dependence on renal dialysis: Secondary | ICD-10-CM | POA: Diagnosis not present

## 2018-09-23 DIAGNOSIS — N186 End stage renal disease: Secondary | ICD-10-CM | POA: Diagnosis not present

## 2018-09-23 DIAGNOSIS — D649 Anemia, unspecified: Secondary | ICD-10-CM | POA: Diagnosis not present

## 2018-09-24 ENCOUNTER — Other Ambulatory Visit: Payer: Self-pay

## 2018-09-24 ENCOUNTER — Emergency Department (HOSPITAL_COMMUNITY): Payer: Medicare Other

## 2018-09-24 ENCOUNTER — Encounter (HOSPITAL_COMMUNITY): Payer: Self-pay | Admitting: Emergency Medicine

## 2018-09-24 ENCOUNTER — Emergency Department (HOSPITAL_COMMUNITY)
Admission: EM | Admit: 2018-09-24 | Discharge: 2018-09-24 | Disposition: A | Discharge status: Home / Self Care | Payer: Medicare Other | Attending: Emergency Medicine | Admitting: Emergency Medicine

## 2018-09-24 DIAGNOSIS — Z992 Dependence on renal dialysis: Secondary | ICD-10-CM | POA: Diagnosis not present

## 2018-09-24 DIAGNOSIS — N186 End stage renal disease: Secondary | ICD-10-CM | POA: Diagnosis not present

## 2018-09-24 DIAGNOSIS — I132 Hypertensive heart and chronic kidney disease with heart failure and with stage 5 chronic kidney disease, or end stage renal disease: Secondary | ICD-10-CM | POA: Insufficient documentation

## 2018-09-24 DIAGNOSIS — Z7982 Long term (current) use of aspirin: Secondary | ICD-10-CM | POA: Insufficient documentation

## 2018-09-24 DIAGNOSIS — I2510 Atherosclerotic heart disease of native coronary artery without angina pectoris: Secondary | ICD-10-CM | POA: Insufficient documentation

## 2018-09-24 DIAGNOSIS — R531 Weakness: Secondary | ICD-10-CM | POA: Insufficient documentation

## 2018-09-24 DIAGNOSIS — I5032 Chronic diastolic (congestive) heart failure: Secondary | ICD-10-CM | POA: Diagnosis not present

## 2018-09-24 DIAGNOSIS — I252 Old myocardial infarction: Secondary | ICD-10-CM | POA: Insufficient documentation

## 2018-09-24 DIAGNOSIS — E1122 Type 2 diabetes mellitus with diabetic chronic kidney disease: Secondary | ICD-10-CM | POA: Insufficient documentation

## 2018-09-24 DIAGNOSIS — J189 Pneumonia, unspecified organism: Secondary | ICD-10-CM | POA: Diagnosis not present

## 2018-09-24 DIAGNOSIS — Z79899 Other long term (current) drug therapy: Secondary | ICD-10-CM | POA: Diagnosis not present

## 2018-09-24 DIAGNOSIS — J90 Pleural effusion, not elsewhere classified: Secondary | ICD-10-CM | POA: Diagnosis not present

## 2018-09-24 DIAGNOSIS — I4510 Unspecified right bundle-branch block: Secondary | ICD-10-CM | POA: Diagnosis not present

## 2018-09-24 LAB — COMPREHENSIVE METABOLIC PANEL
ALT: 13 U/L (ref 0–44)
AST: 24 U/L (ref 15–41)
Albumin: 1.8 g/dL — ABNORMAL LOW (ref 3.5–5.0)
Alkaline Phosphatase: 69 U/L (ref 38–126)
Anion gap: 9 (ref 5–15)
BUN: 21 mg/dL (ref 8–23)
CO2: 27 mmol/L (ref 22–32)
Calcium: 7.7 mg/dL — ABNORMAL LOW (ref 8.9–10.3)
Chloride: 100 mmol/L (ref 98–111)
Creatinine, Ser: 4.85 mg/dL — ABNORMAL HIGH (ref 0.44–1.00)
GFR calc Af Amer: 9 mL/min — ABNORMAL LOW (ref >60)
GFR calc non Af Amer: 8 mL/min — ABNORMAL LOW (ref >60)
Glucose, Bld: 143 mg/dL — ABNORMAL HIGH (ref 70–99)
Potassium: 3.7 mmol/L (ref 3.5–5.1)
Sodium: 136 mmol/L (ref 135–145)
Total Bilirubin: 0.4 mg/dL (ref 0.3–1.2)
Total Protein: 4.9 g/dL — ABNORMAL LOW (ref 6.5–8.1)

## 2018-09-24 LAB — CBC WITH DIFFERENTIAL/PLATELET
Abs Immature Granulocytes: 0.01 10*3/uL (ref 0.00–0.07)
Basophils Absolute: 0 10*3/uL (ref 0.0–0.1)
Basophils Relative: 1 %
Eosinophils Absolute: 0 10*3/uL (ref 0.0–0.5)
Eosinophils Relative: 1 %
HCT: 36.8 % (ref 36.0–46.0)
Hemoglobin: 11.2 g/dL — ABNORMAL LOW (ref 12.0–15.0)
Immature Granulocytes: 0 %
Lymphocytes Relative: 17 %
Lymphs Abs: 1.2 10*3/uL (ref 0.7–4.0)
MCH: 27 pg (ref 26.0–34.0)
MCHC: 30.4 g/dL (ref 30.0–36.0)
MCV: 88.7 fL (ref 80.0–100.0)
Monocytes Absolute: 0.4 10*3/uL (ref 0.1–1.0)
Monocytes Relative: 6 %
Neutro Abs: 5.3 10*3/uL (ref 1.7–7.7)
Neutrophils Relative %: 75 %
Platelets: 151 10*3/uL (ref 150–400)
RBC: 4.15 MIL/uL (ref 3.87–5.11)
RDW: 18.5 % — ABNORMAL HIGH (ref 11.5–15.5)
WBC: 6.9 10*3/uL (ref 4.0–10.5)
nRBC: 0 % (ref 0.0–0.2)

## 2018-09-24 NOTE — ED Notes (Signed)
Pt is HD and does not void.

## 2018-09-24 NOTE — ED Provider Notes (Signed)
[facility] EMERGENCY DEPARTMENT Provider Note   CSN: [id number] Arrival date & time: 09/24/18  1351    History   Chief Complaint Chief Complaint  Patient presents with  . Weakness    HPI Courtney [name] is a 78 y.o. female.     HPI   Patient is a 78 year old female with a history of anemia, CHF, CAD, diabetes, ESRD on dialysis (Tuesday, Thursday, Saturday), hyperlipidemia, hypertension, MI, CVA, who presents to the emergency department today for evaluation at the request of the patient's dialysis nurse.  Part of the history is provided by the patient's family member who is at bedside.  He states that the nurse at dialysis felt that she did not look well enough to get dialysis and felt that she needed to be medically evaluated.  Family member states that she did not complete any dialysis today.  Earlier this week she was dialyzed for only 45 minutes on Tuesday and 1 hour on Thursday.  She could not tolerate dialysis because she stated she was having all over body pains and did not feel good.  Per patient, she states she feels "terrible ".  Level 5 caveat if she does not further evaluate on her symptoms.  Family states she has been complaining of pain all over and that she has a bedsore to her bottom.  The wound appears unchanged.  She has had no known fevers at home no vomiting or diarrhea.  She used to make urine, but he is not sure if she still makes any.  Son states that her face looked puffy today and her feet are also somewhat swollen.  He states she has not been eating or drinking much.  Reviewed records per epic, patient seen 2 days ago for similar complaints.  She had reassuring work-up and was discharged in stable condition.  Past Medical History:  Diagnosis Date  . Anemia 07/2018  . CHF (congestive heart failure) ([place])   . Coronary artery disease   . Diabetes mellitus without complication ([place])   . ESRD on dialysis ([place]) 02/10/2017  . Hyperlipidemia   . Hypertension   .  Myocardial infarct ([place])   . Stroke [facility]    2-3 yrs ago- right sided weakness    Patient Active Problem List   Diagnosis Date Noted  . FTT (failure to thrive) in adult   . Recurrent pleural effusion on left 09/01/2018  . Chronic diastolic CHF (congestive heart failure) ([place]) 09/01/2018  . History of stroke 09/01/2018  . Excessive somnolence disorder 09/01/2018  . Goals of care, counseling/discussion   . Palliative care by specialist   . Protein-calorie malnutrition, severe 08/09/2018  . Hypotensive episode 08/07/2018  . GI bleed 08/07/2018  . Shock circulatory ([place])   . S/P thoracentesis   . Pneumothorax on left   . Pressure injury of skin 08/04/2018  . Symptomatic anemia 07/30/2018  . Acute CVA (cerebrovascular accident) ([place]) 07/13/2017  . Hypokalemia 07/13/2017  . Anemia in ESRD (end-stage renal disease) ([place]) 07/13/2017  . Hyperlipidemia 07/13/2017  . Coronary artery disease 07/13/2017  . Type 2 diabetes mellitus ([place]) 07/13/2017  . ESRD on dialysis ([place]) 02/10/2017  . Intertrochanteric fracture of right hip ([place]) 08/16/2012  . Syncope 07/17/2012  . Closed right hip fracture ([place]) 07/17/2012  . HTN (hypertension) 07/17/2012  . Cerebrovascular disease 07/17/2012    Past Surgical History:  Procedure Laterality Date  . APPENDECTOMY    . AV FISTULA PLACEMENT Right 09/20/2015   Procedure: PLACEMENT OF RIGHT UPPER  EXTREMITY ARTERIOVENOUS GORE-TEX GRAFT FOR HEMODIALYSIS ACCESS;  Surgeon: Vickie Epley, MD;  Location: [facility];  Service: Vascular;  Laterality: Right;  . CATARACT EXTRACTION W/PHACO Left 06/29/2013   Procedure: CATARACT EXTRACTION PHACO AND INTRAOCULAR LENS PLACEMENT (IOC);  Surgeon: Tonny Branch, MD;  Location: [facility];  Service: Ophthalmology;  Laterality: Left;  CDE 12.25  . CATARACT EXTRACTION W/PHACO Right 07/27/2013   Procedure: CATARACT EXTRACTION PHACO AND INTRAOCULAR LENS PLACEMENT (IOC);  Surgeon: Tonny Branch, MD;  Location: [facility];  Service: Ophthalmology;   Laterality: Right;  CDE:7.72  . COLONOSCOPY WITH PROPOFOL N/A 08/07/2018   Procedure: COLONOSCOPY WITH PROPOFOL;  Surgeon: Ronnette Juniper, MD;  Location: [place];  Service: Gastroenterology;  Laterality: N/A;  . ESOPHAGOGASTRODUODENOSCOPY N/A 08/03/2018   Procedure: ESOPHAGOGASTRODUODENOSCOPY (EGD);  Surgeon: Clarene Essex, MD;  Location: [place];  Service: Endoscopy;  Laterality: N/A;  . ESOPHAGOGASTRODUODENOSCOPY (EGD) WITH PROPOFOL N/A 08/07/2018   Procedure: ESOPHAGOGASTRODUODENOSCOPY (EGD) WITH PROPOFOL;  Surgeon: Ronnette Juniper, MD;  Location: [place];  Service: Gastroenterology;  Laterality: N/A;  . GIVENS CAPSULE STUDY N/A 08/07/2018   Procedure: GIVENS CAPSULE STUDY;  Surgeon: Ronnette Juniper, MD;  Location: [place];  Service: Gastroenterology;  Laterality: N/A;  . HEMOSTASIS CLIP PLACEMENT  08/07/2018   Procedure: HEMOSTASIS CLIP PLACEMENT;  Surgeon: Ronnette Juniper, MD;  Location: [place];  Service: Gastroenterology;;  . HOT HEMOSTASIS N/A 08/03/2018   Procedure: HOT HEMOSTASIS (ARGON PLASMA COAGULATION/BICAP);  Surgeon: Clarene Essex, MD;  Location: [place];  Service: Endoscopy;  Laterality: N/A;  . INSERTION OF DIALYSIS CATHETER Right 09/11/2015   Procedure: INSERTION OF TUNNELED DIALYSIS CATHETER;  Surgeon: Vickie Epley, MD;  Location: [facility];  Service: Vascular;  Laterality: Right;  . INTRAMEDULLARY (IM) NAIL INTERTROCHANTERIC Right 07/20/2012   Procedure: INTRAMEDULLARY (IM) NAIL INTERTROCHANTRIC;  Surgeon: Carole Civil, MD;  Location: [facility];  Service: Orthopedics;  Laterality: Right;  . IR AV DIALY SHUNT INTRO NEEDLE/INTRAC INITIAL W/PTA/STENT/IMG RT Right 07/20/2018  . IR GENERIC HISTORICAL  09/25/2015   IR US GUIDE VASC ACCESS RIGHT 09/25/2015 Sandi Mariscal, MD [facility]  . IR GENERIC HISTORICAL  09/25/2015   IR FLUORO GUIDE CV LINE RIGHT 09/25/2015 Sandi Mariscal, MD [facility]  . IR GENERIC HISTORICAL  09/25/2015   IR REMOVAL TUN CV CATH W/O FL 09/25/2015 Sandi Mariscal, MD  [facility]  . IR THROMBECTOMY AV FISTULA W/THROMBOLYSIS/PTA INC/SHUNT/IMG RIGHT Right 09/06/2017  . IR THROMBECTOMY AV FISTULA W/THROMBOLYSIS/PTA INC/SHUNT/IMG RIGHT Right 09/29/2017  . IR THROMBECTOMY AV FISTULA W/THROMBOLYSIS/PTA INC/SHUNT/IMG RIGHT Right 07/20/2018  . IR US GUIDE VASC ACCESS RIGHT  09/06/2017  . IR US GUIDE VASC ACCESS RIGHT  09/29/2017  . IR US GUIDE VASC ACCESS RIGHT  07/20/2018  . IR US GUIDE VASC ACCESS RIGHT  07/20/2018  . SCLEROTHERAPY  08/03/2018   Procedure: Clide Deutscher;  Surgeon: Clarene Essex, MD;  [facility]: [facility];  Service: Endoscopy;;     OB History   No obstetric history on file.      Home Medications    Prior to Admission medications   Medication Sig Start Date End Date Taking? Authorizing Provider  aspirin EC 81 MG tablet Take 81 mg by mouth daily.   Yes [provider]  atorvastatin (LIPITOR) 20 MG tablet Take 20 mg by mouth daily.   Yes [provider]  escitalopram (LEXAPRO) 10 MG tablet Take 5 mg by mouth daily.   Yes [provider]  loperamide (IMODIUM A-D) 2 MG tablet Take 2 mg by mouth every  6 (six) hours as needed for diarrhea or loose stools.   Yes [provider]  midodrine (PROAMATINE) 10 MG tablet Take 1 tablet (10 mg total) by mouth 3 (three) times daily with meals. 08/19/18  Yes Mikhail, [place], DO  multivitamin (RENA-VIT) TABS tablet Take 1 tablet by mouth at bedtime. Patient taking differently: Take 1 tablet by mouth every evening.  08/19/18  Yes Mikhail, Maryann, DO  Pancrelipase, Lip-Prot-Amyl, (CREON) 6000 units CPEP Take 1 capsule by mouth 3 (three) times daily with meals. 09/27/17  Yes [provider]  pantoprazole (PROTONIX) 40 MG tablet Take 1 tablet (40 mg total) by mouth 2 (two) times daily. 08/19/18  Yes Mikhail, Clinical biochemist, DO  Amino Acids-Protein Hydrolys (FEEDING SUPPLEMENT, PRO-STAT SUGAR FREE 64,) LIQD Take 30 mLs by mouth 3 (three) times daily with meals. Patient not taking:  Reported on 09/24/2018 08/19/18   Cristal Ford, DO    Family History Family History  Problem Relation Age of Onset  . Heart disease Mother   . Heart disease Father     Social History Social History   Tobacco Use  . Smoking status: Never Smoker  . Smokeless tobacco: Never Used  Substance Use Topics  . Alcohol use: No  . Drug use: No     Allergies   Grapefruit flavor [flavoring agent]   Review of Systems Review of Systems  Constitutional: Negative for fever.  HENT: Negative for ear pain and sore throat.   Eyes: Negative for redness.  Respiratory: Negative for cough and shortness of breath.   Cardiovascular: Negative for chest pain.  Gastrointestinal: Negative for abdominal pain, diarrhea, nausea and vomiting.  Musculoskeletal: Positive for myalgias.  Skin: Positive for wound.  Neurological: Positive for weakness (generalized). Negative for syncope and headaches.  All other systems reviewed and are negative.    Physical Exam Updated Vital Signs BP (!) 94/51   Pulse 62   Temp (!) 97.5 F (36.4 C) (Oral)   Resp (!) 22   Ht 4\' 11"  (1.499 m)   Wt 35 kg   SpO2 100%   BMI 15.58 kg/m   Physical Exam Vitals signs and nursing note reviewed.  Constitutional:      General: She is not in acute distress.    Appearance: She is well-developed.     Comments: Frail, chronically ill appearing women  HENT:     Head: Normocephalic and atraumatic.     Mouth/Throat:     Mouth: Mucous membranes are moist.  Eyes:     Conjunctiva/sclera: Conjunctivae normal.  Neck:     Musculoskeletal: Neck supple.  Cardiovascular:     Rate and Rhythm: Normal rate and regular rhythm.     Heart sounds: Normal heart sounds. No murmur.  Pulmonary:     Effort: Pulmonary effort is normal. No respiratory distress.     Breath sounds: No wheezing, rhonchi or rales.     Comments: Decreased lung sounds on left Abdominal:     General: Bowel sounds are normal.     Palpations: Abdomen is soft.      Tenderness: There is no abdominal tenderness. There is no guarding or rebound.  Musculoskeletal:     Comments: 1+ pitting edema to the bilateral lower extremities.  Skin:    General: Skin is warm and dry.     Comments: Superficial wound to the sacrum.  No surrounding erythema, induration or fluctuance.  Neurological:     Mental Status: She is alert.      ED Treatments /  Results  Labs (all labs ordered are listed, but only abnormal results are displayed) Labs Reviewed  CBC WITH DIFFERENTIAL/PLATELET - Abnormal; Notable for the following components:      Result Value   Hemoglobin 11.2 (*)    RDW 18.5 (*)    All other components within normal limits  COMPREHENSIVE METABOLIC PANEL - Abnormal; Notable for the following components:   Glucose, Bld 143 (*)    Creatinine, Ser 4.85 (*)    Calcium 7.7 (*)    Total Protein 4.9 (*)    Albumin 1.8 (*)    GFR calc non Af Amer 8 (*)    GFR calc Af Amer 9 (*)    All other components within normal limits    EKG EKG Interpretation  Date/Time:  Saturday September 24 2018 15:43:29 EDT Ventricular Rate:  58 PR Interval:    QRS Duration: 118 QT Interval:  480 QTC Calculation: 472 R Axis:   53 Text Interpretation:  Junctional rhythm Incomplete right bundle branch block Borderline low voltage, extremity leads Minimal ST elevation, lateral leads No acute changes s1q3 with t wave flattening diffusely Confirmed by Varney Biles [phone]) on 09/24/2018 3:53:55 PM   Radiology Dg Chest Portable 1 View  Result Date: 09/24/2018 CLINICAL DATA:  Pt brought in by son after she went to dialysis this morning and the nurse did not like how she looked and asked for her to be evaluated. Son reports her feet are swollen more than usual. HISTORY OF STROKE, MI, CAD, CHF, DM, HTN EXAM: PORTABLE CHEST 1 VIEW COMPARISON:  09/01/2018 and older exams FINDINGS: Large left and small to moderate right pleural effusions. There is vascular prominence without convincing  pulmonary edema. Cardiac silhouette is partly obscured. No mediastinal or convincing hilar masses. Skeletal structures are demineralized but grossly intact. IMPRESSION: 1. Findings similar to the most recent prior exam. Large left and small to moderate right pleural effusions without convincing pulmonary edema. Presumed lung base atelectasis associated with pleural effusions. No convincing pneumonia. Electronically Signed   By: Lajean Manes M.D.   On: 09/24/2018 15:25    Procedures Procedures (including critical care time)  Medications Ordered in ED Medications - No data to display   Initial Impression / Assessment and Plan / ED Course  I have reviewed the triage vital signs and the nursing notes.  Pertinent labs & imaging results that were available during my care of the patient were reviewed by me and considered in my medical decision making (see chart for details).    Final Clinical Impressions(s) / ED Diagnoses   Final diagnoses:  Generalized weakness   78 year old female with history of ESRD on dialysis presenting for evaluation at the request of the patient's dialysis nurse.  Son at bedside assists with history and states that patient has had 2 sessions of dialysis this week that were not completed fully and today she did not have dialysis because the nurse thought that she looked generally weak.  Patient states she feels terrible and has pain all over.  She does not elaborate more on her symptoms.  Son denies any fevers, vomiting, diarrhea or other complaints.  She is somewhat hypotensive however this appears to be her baseline.  The remainder of her vital signs are reassuring. On exam, the patient is frail appearing and chronically ill-appearing.  Her abdomen is soft and nontender.  She does have some decreased lung sounds on the left, but no rales rhonchi or wheezing noted.  Heart with regular rate and  rhythm.  She does have some mild bilateral lower extremity edema.  She has a  sacral wound that appears to be well-healing.    CBC is without leukocytosis, anemia present but stable. CMP with normal electrolytes.  Creatinine elevated at 4.85 consistent with missed dialysis.  She does have low calcium, total protein and albumin.  Her hypoalbuminemia may also be contributing to her bilateral lower extremity edema.  Liver enzymes are normal.  EKG is unchanged from prior.  Chest x-ray with bilateral pleural effusions without evidence of pulmonary edema.  These effusions were present on prior chest x-ray and appears stable.  Patient's work-up is reassuring.  She does have some pleural effusions however she is satting 100% on room air.  Think that the patient is deconditioned and chronically ill but I do not see evidence of acute pathology at this time that would require further work-up or admission to the hospital.  Have discussed close follow-up with the patient's family and advised on return precautions.  They voiced understanding the plan and reasons to return.  All questions answered.  Patient stable for discharge.  Patient seen in conjunction with Dr. Sedonia Small who personally evaluated the patient and is in agreement with this plan.  ED Discharge Orders    None       Rodney Booze, [place] 09/24/18 1624    Maudie Flakes, MD 09/25/18 [phone]

## 2018-09-24 NOTE — Discharge Instructions (Addendum)
Please follow up with your primary care provider within 2-3 days for re-evaluation of your symptoms. If you do not have a primary care provider, information for a healthcare clinic has been provided for you to make arrangements for follow up care. Please return to the emergency department for any new or worsening symptoms.

## 2018-09-24 NOTE — ED Triage Notes (Signed)
Pt brought in by son after she went to dialysis this morning and the nurse did not like how she looked and asked for her to be evaluated. Son reports her feet are swollen more than usual.

## 2018-09-24 NOTE — ED Provider Notes (Signed)
Ultrasound ED Peripheral IV (Provider)  Date/Time: 09/24/2018 3:03 PM Performed by: Maudie Flakes, MD Authorized by: Maudie Flakes, MD   Procedure details:    Indications: poor IV access     Skin Prep: chlorhexidine gluconate     Location: Left upper arm.   Angiocath:  20 G   Bedside Ultrasound Guided: Yes     Images: not archived     Patient tolerated procedure without complications: Yes     Dressing applied: Yes        Maudie Flakes, MD 09/24/18 1504

## 2018-09-26 DIAGNOSIS — E46 Unspecified protein-calorie malnutrition: Secondary | ICD-10-CM | POA: Diagnosis not present

## 2018-09-26 DIAGNOSIS — Z992 Dependence on renal dialysis: Secondary | ICD-10-CM | POA: Diagnosis not present

## 2018-09-26 DIAGNOSIS — N186 End stage renal disease: Secondary | ICD-10-CM | POA: Diagnosis not present

## 2018-09-26 DIAGNOSIS — E1122 Type 2 diabetes mellitus with diabetic chronic kidney disease: Secondary | ICD-10-CM | POA: Diagnosis not present

## 2018-09-26 DIAGNOSIS — D649 Anemia, unspecified: Secondary | ICD-10-CM | POA: Diagnosis not present

## 2018-09-26 DIAGNOSIS — I2510 Atherosclerotic heart disease of native coronary artery without angina pectoris: Secondary | ICD-10-CM | POA: Diagnosis not present

## 2018-09-27 DIAGNOSIS — D631 Anemia in chronic kidney disease: Secondary | ICD-10-CM | POA: Diagnosis not present

## 2018-09-27 DIAGNOSIS — Z992 Dependence on renal dialysis: Secondary | ICD-10-CM | POA: Diagnosis not present

## 2018-09-27 DIAGNOSIS — N2581 Secondary hyperparathyroidism of renal origin: Secondary | ICD-10-CM | POA: Diagnosis not present

## 2018-09-27 DIAGNOSIS — N186 End stage renal disease: Secondary | ICD-10-CM | POA: Diagnosis not present

## 2018-09-27 DIAGNOSIS — D509 Iron deficiency anemia, unspecified: Secondary | ICD-10-CM | POA: Diagnosis not present

## 2018-09-29 DIAGNOSIS — D509 Iron deficiency anemia, unspecified: Secondary | ICD-10-CM | POA: Diagnosis not present

## 2018-09-29 DIAGNOSIS — N2581 Secondary hyperparathyroidism of renal origin: Secondary | ICD-10-CM | POA: Diagnosis not present

## 2018-09-29 DIAGNOSIS — D631 Anemia in chronic kidney disease: Secondary | ICD-10-CM | POA: Diagnosis not present

## 2018-09-29 DIAGNOSIS — Z992 Dependence on renal dialysis: Secondary | ICD-10-CM | POA: Diagnosis not present

## 2018-09-29 DIAGNOSIS — N186 End stage renal disease: Secondary | ICD-10-CM | POA: Diagnosis not present

## 2018-09-30 DIAGNOSIS — Z992 Dependence on renal dialysis: Secondary | ICD-10-CM | POA: Diagnosis not present

## 2018-09-30 DIAGNOSIS — E46 Unspecified protein-calorie malnutrition: Secondary | ICD-10-CM | POA: Diagnosis not present

## 2018-09-30 DIAGNOSIS — N186 End stage renal disease: Secondary | ICD-10-CM | POA: Diagnosis not present

## 2018-09-30 DIAGNOSIS — D649 Anemia, unspecified: Secondary | ICD-10-CM | POA: Diagnosis not present

## 2018-09-30 DIAGNOSIS — I2510 Atherosclerotic heart disease of native coronary artery without angina pectoris: Secondary | ICD-10-CM | POA: Diagnosis not present

## 2018-09-30 DIAGNOSIS — E1122 Type 2 diabetes mellitus with diabetic chronic kidney disease: Secondary | ICD-10-CM | POA: Diagnosis not present

## 2018-10-04 DIAGNOSIS — N186 End stage renal disease: Secondary | ICD-10-CM | POA: Diagnosis not present

## 2018-10-04 DIAGNOSIS — D509 Iron deficiency anemia, unspecified: Secondary | ICD-10-CM | POA: Diagnosis not present

## 2018-10-04 DIAGNOSIS — N2581 Secondary hyperparathyroidism of renal origin: Secondary | ICD-10-CM | POA: Diagnosis not present

## 2018-10-04 DIAGNOSIS — D631 Anemia in chronic kidney disease: Secondary | ICD-10-CM | POA: Diagnosis not present

## 2018-10-04 DIAGNOSIS — Z992 Dependence on renal dialysis: Secondary | ICD-10-CM | POA: Diagnosis not present

## 2018-10-07 DIAGNOSIS — I2510 Atherosclerotic heart disease of native coronary artery without angina pectoris: Secondary | ICD-10-CM | POA: Diagnosis not present

## 2018-10-07 DIAGNOSIS — Z992 Dependence on renal dialysis: Secondary | ICD-10-CM | POA: Diagnosis not present

## 2018-10-07 DIAGNOSIS — D649 Anemia, unspecified: Secondary | ICD-10-CM | POA: Diagnosis not present

## 2018-10-07 DIAGNOSIS — N186 End stage renal disease: Secondary | ICD-10-CM | POA: Diagnosis not present

## 2018-10-07 DIAGNOSIS — E1122 Type 2 diabetes mellitus with diabetic chronic kidney disease: Secondary | ICD-10-CM | POA: Diagnosis not present

## 2018-10-07 DIAGNOSIS — E46 Unspecified protein-calorie malnutrition: Secondary | ICD-10-CM | POA: Diagnosis not present

## 2018-10-08 DIAGNOSIS — N2581 Secondary hyperparathyroidism of renal origin: Secondary | ICD-10-CM | POA: Diagnosis not present

## 2018-10-08 DIAGNOSIS — Z992 Dependence on renal dialysis: Secondary | ICD-10-CM | POA: Diagnosis not present

## 2018-10-08 DIAGNOSIS — D509 Iron deficiency anemia, unspecified: Secondary | ICD-10-CM | POA: Diagnosis not present

## 2018-10-08 DIAGNOSIS — N186 End stage renal disease: Secondary | ICD-10-CM | POA: Diagnosis not present

## 2018-10-08 DIAGNOSIS — D631 Anemia in chronic kidney disease: Secondary | ICD-10-CM | POA: Diagnosis not present

## 2018-10-11 DIAGNOSIS — Z992 Dependence on renal dialysis: Secondary | ICD-10-CM | POA: Diagnosis not present

## 2018-10-11 DIAGNOSIS — D509 Iron deficiency anemia, unspecified: Secondary | ICD-10-CM | POA: Diagnosis not present

## 2018-10-11 DIAGNOSIS — N186 End stage renal disease: Secondary | ICD-10-CM | POA: Diagnosis not present

## 2018-10-11 DIAGNOSIS — N2581 Secondary hyperparathyroidism of renal origin: Secondary | ICD-10-CM | POA: Diagnosis not present

## 2018-10-11 DIAGNOSIS — D631 Anemia in chronic kidney disease: Secondary | ICD-10-CM | POA: Diagnosis not present

## 2018-10-13 DIAGNOSIS — N186 End stage renal disease: Secondary | ICD-10-CM | POA: Diagnosis not present

## 2018-10-13 DIAGNOSIS — D631 Anemia in chronic kidney disease: Secondary | ICD-10-CM | POA: Diagnosis not present

## 2018-10-13 DIAGNOSIS — N2581 Secondary hyperparathyroidism of renal origin: Secondary | ICD-10-CM | POA: Diagnosis not present

## 2018-10-13 DIAGNOSIS — Z992 Dependence on renal dialysis: Secondary | ICD-10-CM | POA: Diagnosis not present

## 2018-10-13 DIAGNOSIS — D509 Iron deficiency anemia, unspecified: Secondary | ICD-10-CM | POA: Diagnosis not present

## 2018-10-14 DIAGNOSIS — N186 End stage renal disease: Secondary | ICD-10-CM | POA: Diagnosis not present

## 2018-10-14 DIAGNOSIS — D649 Anemia, unspecified: Secondary | ICD-10-CM | POA: Diagnosis not present

## 2018-10-14 DIAGNOSIS — E1122 Type 2 diabetes mellitus with diabetic chronic kidney disease: Secondary | ICD-10-CM | POA: Diagnosis not present

## 2018-10-14 DIAGNOSIS — Z992 Dependence on renal dialysis: Secondary | ICD-10-CM | POA: Diagnosis not present

## 2018-10-14 DIAGNOSIS — E46 Unspecified protein-calorie malnutrition: Secondary | ICD-10-CM | POA: Diagnosis not present

## 2018-10-14 DIAGNOSIS — I2510 Atherosclerotic heart disease of native coronary artery without angina pectoris: Secondary | ICD-10-CM | POA: Diagnosis not present

## 2018-10-15 DIAGNOSIS — N186 End stage renal disease: Secondary | ICD-10-CM | POA: Diagnosis not present

## 2018-10-15 DIAGNOSIS — Z992 Dependence on renal dialysis: Secondary | ICD-10-CM | POA: Diagnosis not present

## 2018-10-15 DIAGNOSIS — D631 Anemia in chronic kidney disease: Secondary | ICD-10-CM | POA: Diagnosis not present

## 2018-10-15 DIAGNOSIS — N2581 Secondary hyperparathyroidism of renal origin: Secondary | ICD-10-CM | POA: Diagnosis not present

## 2018-10-15 DIAGNOSIS — D509 Iron deficiency anemia, unspecified: Secondary | ICD-10-CM | POA: Diagnosis not present

## 2018-10-18 DIAGNOSIS — N2581 Secondary hyperparathyroidism of renal origin: Secondary | ICD-10-CM | POA: Diagnosis not present

## 2018-10-18 DIAGNOSIS — D631 Anemia in chronic kidney disease: Secondary | ICD-10-CM | POA: Diagnosis not present

## 2018-10-18 DIAGNOSIS — D509 Iron deficiency anemia, unspecified: Secondary | ICD-10-CM | POA: Diagnosis not present

## 2018-10-18 DIAGNOSIS — N186 End stage renal disease: Secondary | ICD-10-CM | POA: Diagnosis not present

## 2018-10-18 DIAGNOSIS — Z992 Dependence on renal dialysis: Secondary | ICD-10-CM | POA: Diagnosis not present

## 2018-10-22 DIAGNOSIS — N186 End stage renal disease: Secondary | ICD-10-CM | POA: Diagnosis not present

## 2018-10-22 DIAGNOSIS — N2581 Secondary hyperparathyroidism of renal origin: Secondary | ICD-10-CM | POA: Diagnosis not present

## 2018-10-22 DIAGNOSIS — Z992 Dependence on renal dialysis: Secondary | ICD-10-CM | POA: Diagnosis not present

## 2018-10-22 DIAGNOSIS — D631 Anemia in chronic kidney disease: Secondary | ICD-10-CM | POA: Diagnosis not present

## 2018-10-22 DIAGNOSIS — D509 Iron deficiency anemia, unspecified: Secondary | ICD-10-CM | POA: Diagnosis not present

## 2018-10-24 DIAGNOSIS — Z992 Dependence on renal dialysis: Secondary | ICD-10-CM | POA: Diagnosis not present

## 2018-10-24 DIAGNOSIS — N186 End stage renal disease: Secondary | ICD-10-CM | POA: Diagnosis not present

## 2018-10-25 DIAGNOSIS — N186 End stage renal disease: Secondary | ICD-10-CM | POA: Diagnosis not present

## 2018-10-25 DIAGNOSIS — Z992 Dependence on renal dialysis: Secondary | ICD-10-CM | POA: Diagnosis not present

## 2018-10-25 DIAGNOSIS — D631 Anemia in chronic kidney disease: Secondary | ICD-10-CM | POA: Diagnosis not present

## 2018-10-25 DIAGNOSIS — N2581 Secondary hyperparathyroidism of renal origin: Secondary | ICD-10-CM | POA: Diagnosis not present

## 2018-10-27 DIAGNOSIS — Z992 Dependence on renal dialysis: Secondary | ICD-10-CM | POA: Diagnosis not present

## 2018-10-27 DIAGNOSIS — N2581 Secondary hyperparathyroidism of renal origin: Secondary | ICD-10-CM | POA: Diagnosis not present

## 2018-10-27 DIAGNOSIS — N186 End stage renal disease: Secondary | ICD-10-CM | POA: Diagnosis not present

## 2018-10-27 DIAGNOSIS — D631 Anemia in chronic kidney disease: Secondary | ICD-10-CM | POA: Diagnosis not present

## 2018-11-01 DIAGNOSIS — Z992 Dependence on renal dialysis: Secondary | ICD-10-CM | POA: Diagnosis not present

## 2018-11-01 DIAGNOSIS — N2581 Secondary hyperparathyroidism of renal origin: Secondary | ICD-10-CM | POA: Diagnosis not present

## 2018-11-01 DIAGNOSIS — N186 End stage renal disease: Secondary | ICD-10-CM | POA: Diagnosis not present

## 2018-11-01 DIAGNOSIS — D631 Anemia in chronic kidney disease: Secondary | ICD-10-CM | POA: Diagnosis not present

## 2018-11-05 DIAGNOSIS — N2581 Secondary hyperparathyroidism of renal origin: Secondary | ICD-10-CM | POA: Diagnosis not present

## 2018-11-05 DIAGNOSIS — N186 End stage renal disease: Secondary | ICD-10-CM | POA: Diagnosis not present

## 2018-11-05 DIAGNOSIS — D631 Anemia in chronic kidney disease: Secondary | ICD-10-CM | POA: Diagnosis not present

## 2018-11-05 DIAGNOSIS — Z992 Dependence on renal dialysis: Secondary | ICD-10-CM | POA: Diagnosis not present

## 2018-11-07 DIAGNOSIS — I5022 Chronic systolic (congestive) heart failure: Secondary | ICD-10-CM | POA: Diagnosis not present

## 2018-11-07 DIAGNOSIS — I679 Cerebrovascular disease, unspecified: Secondary | ICD-10-CM | POA: Diagnosis not present

## 2018-11-07 DIAGNOSIS — E782 Mixed hyperlipidemia: Secondary | ICD-10-CM | POA: Diagnosis not present

## 2018-11-07 DIAGNOSIS — I10 Essential (primary) hypertension: Secondary | ICD-10-CM | POA: Diagnosis not present

## 2018-11-10 ENCOUNTER — Emergency Department (HOSPITAL_COMMUNITY): Payer: Medicare Other

## 2018-11-10 ENCOUNTER — Inpatient Hospital Stay (HOSPITAL_COMMUNITY): Payer: Medicare Other

## 2018-11-10 ENCOUNTER — Inpatient Hospital Stay (HOSPITAL_COMMUNITY)
Admission: EM | Admit: 2018-11-10 | Discharge: 2018-11-24 | DRG: 871 | Disposition: E | Payer: Medicare Other | Attending: Internal Medicine | Admitting: Internal Medicine

## 2018-11-10 ENCOUNTER — Other Ambulatory Visit: Payer: Self-pay

## 2018-11-10 ENCOUNTER — Encounter (HOSPITAL_COMMUNITY): Payer: Self-pay | Admitting: Emergency Medicine

## 2018-11-10 DIAGNOSIS — E872 Acidosis: Secondary | ICD-10-CM | POA: Diagnosis present

## 2018-11-10 DIAGNOSIS — D631 Anemia in chronic kidney disease: Secondary | ICD-10-CM | POA: Diagnosis present

## 2018-11-10 DIAGNOSIS — I2510 Atherosclerotic heart disease of native coronary artery without angina pectoris: Secondary | ICD-10-CM | POA: Diagnosis present

## 2018-11-10 DIAGNOSIS — A419 Sepsis, unspecified organism: Secondary | ICD-10-CM | POA: Diagnosis not present

## 2018-11-10 DIAGNOSIS — E871 Hypo-osmolality and hyponatremia: Secondary | ICD-10-CM | POA: Diagnosis not present

## 2018-11-10 DIAGNOSIS — I5032 Chronic diastolic (congestive) heart failure: Secondary | ICD-10-CM | POA: Diagnosis present

## 2018-11-10 DIAGNOSIS — N2581 Secondary hyperparathyroidism of renal origin: Secondary | ICD-10-CM | POA: Diagnosis present

## 2018-11-10 DIAGNOSIS — E43 Unspecified severe protein-calorie malnutrition: Secondary | ICD-10-CM | POA: Diagnosis present

## 2018-11-10 DIAGNOSIS — N179 Acute kidney failure, unspecified: Secondary | ICD-10-CM | POA: Diagnosis not present

## 2018-11-10 DIAGNOSIS — E1122 Type 2 diabetes mellitus with diabetic chronic kidney disease: Secondary | ICD-10-CM | POA: Diagnosis present

## 2018-11-10 DIAGNOSIS — R64 Cachexia: Secondary | ICD-10-CM | POA: Diagnosis present

## 2018-11-10 DIAGNOSIS — R6521 Severe sepsis with septic shock: Secondary | ICD-10-CM | POA: Diagnosis present

## 2018-11-10 DIAGNOSIS — R531 Weakness: Secondary | ICD-10-CM

## 2018-11-10 DIAGNOSIS — Z681 Body mass index (BMI) 19 or less, adult: Secondary | ICD-10-CM | POA: Diagnosis not present

## 2018-11-10 DIAGNOSIS — K828 Other specified diseases of gallbladder: Secondary | ICD-10-CM | POA: Diagnosis not present

## 2018-11-10 DIAGNOSIS — Z992 Dependence on renal dialysis: Secondary | ICD-10-CM

## 2018-11-10 DIAGNOSIS — E1165 Type 2 diabetes mellitus with hyperglycemia: Secondary | ICD-10-CM | POA: Diagnosis present

## 2018-11-10 DIAGNOSIS — Z5320 Procedure and treatment not carried out because of patient's decision for unspecified reasons: Secondary | ICD-10-CM

## 2018-11-10 DIAGNOSIS — I9589 Other hypotension: Secondary | ICD-10-CM | POA: Diagnosis not present

## 2018-11-10 DIAGNOSIS — I69351 Hemiplegia and hemiparesis following cerebral infarction affecting right dominant side: Secondary | ICD-10-CM | POA: Diagnosis not present

## 2018-11-10 DIAGNOSIS — Z515 Encounter for palliative care: Secondary | ICD-10-CM | POA: Diagnosis not present

## 2018-11-10 DIAGNOSIS — I132 Hypertensive heart and chronic kidney disease with heart failure and with stage 5 chronic kidney disease, or end stage renal disease: Secondary | ICD-10-CM | POA: Diagnosis present

## 2018-11-10 DIAGNOSIS — N12 Tubulo-interstitial nephritis, not specified as acute or chronic: Secondary | ICD-10-CM

## 2018-11-10 DIAGNOSIS — A4150 Gram-negative sepsis, unspecified: Secondary | ICD-10-CM | POA: Diagnosis present

## 2018-11-10 DIAGNOSIS — R627 Adult failure to thrive: Secondary | ICD-10-CM | POA: Diagnosis present

## 2018-11-10 DIAGNOSIS — G9341 Metabolic encephalopathy: Secondary | ICD-10-CM | POA: Diagnosis not present

## 2018-11-10 DIAGNOSIS — E11621 Type 2 diabetes mellitus with foot ulcer: Secondary | ICD-10-CM | POA: Diagnosis present

## 2018-11-10 DIAGNOSIS — J90 Pleural effusion, not elsewhere classified: Secondary | ICD-10-CM | POA: Diagnosis not present

## 2018-11-10 DIAGNOSIS — Z7982 Long term (current) use of aspirin: Secondary | ICD-10-CM

## 2018-11-10 DIAGNOSIS — Z66 Do not resuscitate: Secondary | ICD-10-CM | POA: Diagnosis not present

## 2018-11-10 DIAGNOSIS — T68XXXA Hypothermia, initial encounter: Secondary | ICD-10-CM

## 2018-11-10 DIAGNOSIS — Z91018 Allergy to other foods: Secondary | ICD-10-CM

## 2018-11-10 DIAGNOSIS — L97419 Non-pressure chronic ulcer of right heel and midfoot with unspecified severity: Secondary | ICD-10-CM | POA: Diagnosis present

## 2018-11-10 DIAGNOSIS — I959 Hypotension, unspecified: Secondary | ICD-10-CM | POA: Diagnosis not present

## 2018-11-10 DIAGNOSIS — E785 Hyperlipidemia, unspecified: Secondary | ICD-10-CM | POA: Diagnosis present

## 2018-11-10 DIAGNOSIS — I252 Old myocardial infarction: Secondary | ICD-10-CM

## 2018-11-10 DIAGNOSIS — M8580 Other specified disorders of bone density and structure, unspecified site: Secondary | ICD-10-CM | POA: Diagnosis present

## 2018-11-10 DIAGNOSIS — N186 End stage renal disease: Secondary | ICD-10-CM | POA: Diagnosis present

## 2018-11-10 DIAGNOSIS — I953 Hypotension of hemodialysis: Secondary | ICD-10-CM | POA: Diagnosis not present

## 2018-11-10 DIAGNOSIS — Z5329 Procedure and treatment not carried out because of patient's decision for other reasons: Secondary | ICD-10-CM | POA: Diagnosis not present

## 2018-11-10 DIAGNOSIS — E8770 Fluid overload, unspecified: Secondary | ICD-10-CM | POA: Diagnosis not present

## 2018-11-10 DIAGNOSIS — Z20828 Contact with and (suspected) exposure to other viral communicable diseases: Secondary | ICD-10-CM | POA: Diagnosis present

## 2018-11-10 DIAGNOSIS — Z7189 Other specified counseling: Secondary | ICD-10-CM

## 2018-11-10 DIAGNOSIS — N185 Chronic kidney disease, stage 5: Secondary | ICD-10-CM

## 2018-11-10 DIAGNOSIS — R680 Hypothermia, not associated with low environmental temperature: Secondary | ICD-10-CM | POA: Diagnosis present

## 2018-11-10 DIAGNOSIS — F0390 Unspecified dementia without behavioral disturbance: Secondary | ICD-10-CM | POA: Diagnosis present

## 2018-11-10 DIAGNOSIS — L8990 Pressure ulcer of unspecified site, unspecified stage: Secondary | ICD-10-CM | POA: Diagnosis present

## 2018-11-10 DIAGNOSIS — I700 Atherosclerosis of aorta: Secondary | ICD-10-CM | POA: Diagnosis present

## 2018-11-10 DIAGNOSIS — I120 Hypertensive chronic kidney disease with stage 5 chronic kidney disease or end stage renal disease: Secondary | ICD-10-CM | POA: Diagnosis not present

## 2018-11-10 DIAGNOSIS — G9340 Encephalopathy, unspecified: Secondary | ICD-10-CM | POA: Diagnosis not present

## 2018-11-10 DIAGNOSIS — Z993 Dependence on wheelchair: Secondary | ICD-10-CM

## 2018-11-10 DIAGNOSIS — L97519 Non-pressure chronic ulcer of other part of right foot with unspecified severity: Secondary | ICD-10-CM | POA: Diagnosis not present

## 2018-11-10 DIAGNOSIS — Z8249 Family history of ischemic heart disease and other diseases of the circulatory system: Secondary | ICD-10-CM

## 2018-11-10 DIAGNOSIS — Z452 Encounter for adjustment and management of vascular access device: Secondary | ICD-10-CM | POA: Diagnosis not present

## 2018-11-10 DIAGNOSIS — K219 Gastro-esophageal reflux disease without esophagitis: Secondary | ICD-10-CM | POA: Diagnosis present

## 2018-11-10 LAB — GLUCOSE, CAPILLARY: Glucose-Capillary: 169 mg/dL — ABNORMAL HIGH (ref 70–99)

## 2018-11-10 LAB — PROCALCITONIN: Procalcitonin: 0.2 ng/mL

## 2018-11-10 LAB — COMPREHENSIVE METABOLIC PANEL
ALT: 14 U/L (ref 0–44)
AST: 22 U/L (ref 15–41)
Albumin: 1.6 g/dL — ABNORMAL LOW (ref 3.5–5.0)
Alkaline Phosphatase: 65 U/L (ref 38–126)
Anion gap: 11 (ref 5–15)
BUN: 35 mg/dL — ABNORMAL HIGH (ref 8–23)
CO2: 21 mmol/L — ABNORMAL LOW (ref 22–32)
Calcium: 7.2 mg/dL — ABNORMAL LOW (ref 8.9–10.3)
Chloride: 105 mmol/L (ref 98–111)
Creatinine, Ser: 5.53 mg/dL — ABNORMAL HIGH (ref 0.44–1.00)
GFR calc Af Amer: 8 mL/min — ABNORMAL LOW (ref >60)
GFR calc non Af Amer: 7 mL/min — ABNORMAL LOW (ref >60)
Glucose, Bld: 178 mg/dL — ABNORMAL HIGH (ref 70–99)
Potassium: 4.4 mmol/L (ref 3.5–5.1)
Sodium: 137 mmol/L (ref 135–145)
Total Bilirubin: 0.2 mg/dL — ABNORMAL LOW (ref 0.3–1.2)
Total Protein: 5 g/dL — ABNORMAL LOW (ref 6.5–8.1)

## 2018-11-10 LAB — TYPE AND SCREEN
ABO/RH(D): A POS
Antibody Screen: NEGATIVE

## 2018-11-10 LAB — CBC WITH DIFFERENTIAL/PLATELET
Abs Immature Granulocytes: 0.05 10*3/uL (ref 0.00–0.07)
Basophils Absolute: 0 10*3/uL (ref 0.0–0.1)
Basophils Relative: 0 %
Eosinophils Absolute: 0.1 10*3/uL (ref 0.0–0.5)
Eosinophils Relative: 1 %
HCT: 38.7 % (ref 36.0–46.0)
Hemoglobin: 11.8 g/dL — ABNORMAL LOW (ref 12.0–15.0)
Immature Granulocytes: 1 %
Lymphocytes Relative: 17 %
Lymphs Abs: 1.4 10*3/uL (ref 0.7–4.0)
MCH: 28.4 pg (ref 26.0–34.0)
MCHC: 30.5 g/dL (ref 30.0–36.0)
MCV: 93 fL (ref 80.0–100.0)
Monocytes Absolute: 0.4 10*3/uL (ref 0.1–1.0)
Monocytes Relative: 5 %
Neutro Abs: 6.6 10*3/uL (ref 1.7–7.7)
Neutrophils Relative %: 76 %
Platelets: 155 10*3/uL (ref 150–400)
RBC: 4.16 MIL/uL (ref 3.87–5.11)
RDW: 22.7 % — ABNORMAL HIGH (ref 11.5–15.5)
WBC: 8.5 10*3/uL (ref 4.0–10.5)
nRBC: 0 % (ref 0.0–0.2)

## 2018-11-10 LAB — LACTIC ACID, PLASMA
Lactic Acid, Venous: 3 mmol/L (ref 0.5–1.9)
Lactic Acid, Venous: 2.5 mmol/L (ref 0.5–1.9)

## 2018-11-10 LAB — PROTIME-INR
INR: 1.1 (ref 0.8–1.2)
Prothrombin Time: 14 seconds (ref 11.4–15.2)

## 2018-11-10 LAB — TSH: TSH: 3.937 u[IU]/mL (ref 0.350–4.500)

## 2018-11-10 LAB — SARS CORONAVIRUS 2 BY RT PCR (HOSPITAL ORDER, PERFORMED IN ~~LOC~~ HOSPITAL LAB): SARS Coronavirus 2: NEGATIVE

## 2018-11-10 LAB — T4, FREE: Free T4: 1.22 ng/dL — ABNORMAL HIGH (ref 0.61–1.12)

## 2018-11-10 MED ORDER — INSULIN ASPART 100 UNIT/ML ~~LOC~~ SOLN: 0.0000 [IU] | Freq: Every day | SUBCUTANEOUS | Status: DC

## 2018-11-10 MED ORDER — VANCOMYCIN HCL IN DEXTROSE 750-5 MG/150ML-% IV SOLN
750.0000 mg | Freq: Once | INTRAVENOUS | Status: AC
  Administered 2018-11-10: 750 mg via INTRAVENOUS
  Filled 2018-11-10: qty 150

## 2018-11-10 MED ORDER — MIDODRINE HCL 5 MG PO TABS
10.0000 mg | ORAL_TABLET | Freq: Three times a day (TID) | ORAL | Status: DC
  Administered 2018-11-11 – 2018-11-16 (×15): 10 mg via ORAL
  Filled 2018-11-10 (×16): qty 2

## 2018-11-10 MED ORDER — RENA-VITE PO TABS
1.0000 | ORAL_TABLET | Freq: Every day | ORAL | Status: DC
  Administered 2018-11-10 – 2018-11-15 (×5): 1 via ORAL
  Filled 2018-11-10 (×5): qty 1

## 2018-11-10 MED ORDER — PANCRELIPASE (LIP-PROT-AMYL) 6000-19000 UNITS PO CPEP: 1.0000 | ORAL_CAPSULE | Freq: Three times a day (TID) | ORAL | Status: DC

## 2018-11-10 MED ORDER — ALTEPLASE 2 MG IJ SOLR
2.0000 mg | Freq: Once | INTRAMUSCULAR | Status: DC | PRN
  Filled 2018-11-10: qty 2

## 2018-11-10 MED ORDER — HEPARIN SODIUM (PORCINE) 5000 UNIT/ML IJ SOLN
5000.0000 [IU] | Freq: Three times a day (TID) | INTRAMUSCULAR | Status: DC
  Administered 2018-11-10 – 2018-11-15 (×16): 5000 [IU] via SUBCUTANEOUS
  Filled 2018-11-10 (×15): qty 1

## 2018-11-10 MED ORDER — ATORVASTATIN CALCIUM 10 MG PO TABS
20.0000 mg | ORAL_TABLET | Freq: Every day | ORAL | Status: DC
  Administered 2018-11-11 – 2018-11-15 (×5): 20 mg via ORAL
  Filled 2018-11-10 (×7): qty 2

## 2018-11-10 MED ORDER — PANCRELIPASE (LIP-PROT-AMYL) 12000-38000 UNITS PO CPEP
12000.0000 [IU] | ORAL_CAPSULE | Freq: Three times a day (TID) | ORAL | Status: DC
  Administered 2018-11-11 – 2018-11-16 (×13): 12000 [IU] via ORAL
  Filled 2018-11-10 (×19): qty 1

## 2018-11-10 MED ORDER — MIDODRINE HCL 5 MG PO TABS: 10.0000 mg | ORAL_TABLET | Freq: Three times a day (TID) | ORAL | Status: DC

## 2018-11-10 MED ORDER — PIPERACILLIN-TAZOBACTAM 3.375 G IVPB
3.3750 g | Freq: Two times a day (BID) | INTRAVENOUS | Status: DC
  Administered 2018-11-11: 04:00:00 3.375 g via INTRAVENOUS
  Filled 2018-11-10: qty 50

## 2018-11-10 MED ORDER — ONDANSETRON HCL 4 MG/2ML IJ SOLN
4.0000 mg | Freq: Four times a day (QID) | INTRAMUSCULAR | Status: DC | PRN
  Administered 2018-11-16: 08:00:00 4 mg via INTRAVENOUS
  Filled 2018-11-10: qty 2

## 2018-11-10 MED ORDER — INSULIN ASPART 100 UNIT/ML ~~LOC~~ SOLN
2.0000 [IU] | SUBCUTANEOUS | Status: DC
  Administered 2018-11-10: 4 [IU] via SUBCUTANEOUS
  Administered 2018-11-11 – 2018-11-13 (×3): 2 [IU] via SUBCUTANEOUS
  Administered 2018-11-14: 4 [IU] via SUBCUTANEOUS
  Administered 2018-11-15: 04:00:00 2 [IU] via SUBCUTANEOUS

## 2018-11-10 MED ORDER — PIPERACILLIN-TAZOBACTAM 3.375 G IVPB 30 MIN
3.3750 g | Freq: Once | INTRAVENOUS | Status: AC
  Administered 2018-11-10: 3.375 g via INTRAVENOUS
  Filled 2018-11-10: qty 50

## 2018-11-10 MED ORDER — ACETAMINOPHEN 325 MG PO TABS
650.0000 mg | ORAL_TABLET | Freq: Four times a day (QID) | ORAL | Status: DC | PRN
  Administered 2018-11-14 (×3): 650 mg via ORAL
  Filled 2018-11-10 (×3): qty 2

## 2018-11-10 MED ORDER — SODIUM CHLORIDE 0.9 % IV BOLUS
500.0000 mL | Freq: Once | INTRAVENOUS | Status: AC
  Administered 2018-11-10: 500 mL via INTRAVENOUS

## 2018-11-10 MED ORDER — CHLORHEXIDINE GLUCONATE CLOTH 2 % EX PADS
6.0000 | MEDICATED_PAD | Freq: Every day | CUTANEOUS | Status: DC
  Administered 2018-11-10 – 2018-11-15 (×6): 6 via TOPICAL

## 2018-11-10 MED ORDER — SODIUM CHLORIDE 0.9% FLUSH: 3.0000 mL | INTRAVENOUS | Status: DC | PRN

## 2018-11-10 MED ORDER — ASPIRIN EC 81 MG PO TBEC
81.0000 mg | DELAYED_RELEASE_TABLET | Freq: Every day | ORAL | Status: DC
  Administered 2018-11-11 – 2018-11-15 (×5): 81 mg via ORAL
  Filled 2018-11-10 (×6): qty 1

## 2018-11-10 MED ORDER — ONDANSETRON HCL 4 MG PO TABS: 4.0000 mg | ORAL_TABLET | Freq: Four times a day (QID) | ORAL | Status: DC | PRN

## 2018-11-10 MED ORDER — ACETAMINOPHEN 650 MG RE SUPP: 650.0000 mg | Freq: Four times a day (QID) | RECTAL | Status: DC | PRN

## 2018-11-10 MED ORDER — VANCOMYCIN HCL 1000 MG IV SOLR: 15.0000 mg/kg | Freq: Once | INTRAVENOUS | Status: DC

## 2018-11-10 MED ORDER — TRAMADOL HCL 50 MG PO TABS
50.0000 mg | ORAL_TABLET | Freq: Four times a day (QID) | ORAL | Status: DC | PRN
  Administered 2018-11-10 – 2018-11-18 (×4): 50 mg via ORAL
  Filled 2018-11-10 (×4): qty 1

## 2018-11-10 MED ORDER — PRO-STAT SUGAR FREE PO LIQD
30.0000 mL | Freq: Three times a day (TID) | ORAL | Status: DC
  Administered 2018-11-11 – 2018-11-15 (×14): 30 mL via ORAL
  Filled 2018-11-10 (×14): qty 30

## 2018-11-10 MED ORDER — INSULIN ASPART 100 UNIT/ML ~~LOC~~ SOLN: 0.0000 [IU] | Freq: Three times a day (TID) | SUBCUTANEOUS | Status: DC

## 2018-11-10 MED ORDER — SODIUM CHLORIDE 0.9 % IV SOLN: 250.0000 mL | INTRAVENOUS | Status: DC | PRN

## 2018-11-10 MED ORDER — ESCITALOPRAM OXALATE 10 MG PO TABS
5.0000 mg | ORAL_TABLET | Freq: Every day | ORAL | Status: DC
  Administered 2018-11-11 – 2018-11-17 (×6): 5 mg via ORAL
  Filled 2018-11-10 (×6): qty 1

## 2018-11-10 MED ORDER — VANCOMYCIN HCL IN DEXTROSE 500-5 MG/100ML-% IV SOLN: 500.0000 mg | INTRAVENOUS | Status: DC

## 2018-11-10 MED ORDER — PANTOPRAZOLE SODIUM 40 MG PO TBEC
40.0000 mg | DELAYED_RELEASE_TABLET | Freq: Two times a day (BID) | ORAL | Status: DC
  Administered 2018-11-10 – 2018-11-15 (×10): 40 mg via ORAL
  Filled 2018-11-10 (×11): qty 1

## 2018-11-10 NOTE — ED Notes (Signed)
Hospitalist at bedside 

## 2018-11-10 NOTE — ED Triage Notes (Signed)
Pt's son report pt has increased weakness, fatigue and has missed the last two dialysis treatment.  Went to have dialysis today, but BP was too low to complete.

## 2018-11-10 NOTE — Progress Notes (Signed)
Pharmacy Antibiotic Note  Courtney [name] is a 78 y.o. female admitted on  with sepsis.  Pharmacy has been consulted for vancomycin  dosing.  Plan:vancomycin 750mg  x 1  Vancomycin 500 IV every QTTHSa Dialysis.  Goal trough 15-20 mcg/mL. Zosyn 3.375g IV q8h (4 hour infusion).  Height: 4\' 11"  (149.9 cm) Weight: 79 lb 2.3 oz (35.9 kg) IBW/kg (Calculated) : 43.2  Temp (24hrs), Avg:96.6 F (35.9 C), Min:94.8 F (34.9 C), Max:98.3 F (36.8 C)  Recent Labs  Lab 11/06/2018 1304 11/17/2018 1514  WBC 8.5  --   CREATININE 5.53*  --   LATICACIDVEN 3.0* 2.5*    Estimated Creatinine Clearance: 4.8 mL/min (A) (by C-G formula based on SCr of 5.53 mg/dL (H)).    Allergies  Allergen Reactions  . Grapefruit Flavor [Flavoring Agent] Other (See Comments)    "Combination of this and another med caused a STROKE"    Antimicrobials this admission: 9/17 vancomycin >>  9/17 zosyn >>   Microbiology results: 9/17 BCx: sent  9/17 Covid negative   Thank you for allowing pharmacy to be a part of this patient's care.  Donna Christen Ozell Ferrera 11/07/2018 5:06 PM

## 2018-11-10 NOTE — ED Notes (Signed)
Bair hugger placed on pt.

## 2018-11-10 NOTE — H&P (Addendum)
History and Physical    Courtney [name] [id number] DOB: [birth year] DOA: 10/26/2018  PCP: Neale Burly, MD   Patient coming from: Hemodialysis facility  Chief Complaint: Hypotension  HPI: Courtney [name] is a 78 y.o. female with medical history significant for ESRD on HD TTS, chronic hypotension on midodrine, severe protein-calorie malnutrition, prior CVA, type 2 diabetes, CAD/MI, dyslipidemia, and chronic diastolic CHF, who was brought from her hemodialysis facility with noted severe hypotension.  According to her son at bedside, she is only had some random aches that have been bothering her for the last several weeks, but aside from this she apparently did not complain of any other symptoms such as fever, chills, cough, shortness of breath, chest pain, abdominal pain, nausea, or vomiting.  Son is the primary historian at bedside as patient appears quite weak and somnolent.   ED Course: Patient noted to be significantly hypotensive as well as hypothermic in the ED.  Initial systolic blood pressure readings were in the 60s and patient noted to have temperature of 94.8 at this time.  She is currently finishing up a 1 L fluid bolus and her blood pressures are improving with systolic currently a little over 100, and this may well be her norm.  Pulse rate is 62 in sinus which has been confirmed on EKG.  Chest x-ray with pulmonary vascular congestion and small to moderate right pleural effusion noted.  She is noted to have lactic acidosis with a level of 3 and repeat pending.  No leukocytosis currently noted on lab work and urinalysis could not be obtained.  She is also been started on vancomycin and Zosyn with blood cultures obtained.  COVID testing returned negative.  Patient was seen by nephrology in the ED and will require transfer to [facility] for CVVHD.  Review of Systems: Cannot be obtained on account of patient condition.  Past Medical History:  Diagnosis Date  . Anemia 07/2018  . CHF (congestive  heart failure) ([place])   . Coronary artery disease   . Diabetes mellitus without complication ([place])   . ESRD on dialysis ([place]) 02/10/2017  . Hyperlipidemia   . Hypertension   . Myocardial infarct ([place])   . Stroke [facility])    2-3 yrs ago- right sided weakness    Past Surgical History:  Procedure Laterality Date  . APPENDECTOMY    . AV FISTULA PLACEMENT Right 09/20/2015   Procedure: PLACEMENT OF RIGHT UPPER EXTREMITY ARTERIOVENOUS GORE-TEX GRAFT FOR HEMODIALYSIS ACCESS;  Surgeon: Vickie Epley, MD;  Location: [facility];  Service: Vascular;  Laterality: Right;  . CATARACT EXTRACTION W/PHACO Left 06/29/2013   Procedure: CATARACT EXTRACTION PHACO AND INTRAOCULAR LENS PLACEMENT (IOC);  Surgeon: Tonny Branch, MD;  Location: [facility];  Service: Ophthalmology;  Laterality: Left;  CDE 12.25  . CATARACT EXTRACTION W/PHACO Right 07/27/2013   Procedure: CATARACT EXTRACTION PHACO AND INTRAOCULAR LENS PLACEMENT (IOC);  Surgeon: Tonny Branch, MD;  Location: [facility];  Service: Ophthalmology;  Laterality: Right;  CDE:7.72  . COLONOSCOPY WITH PROPOFOL N/A 08/07/2018   Procedure: COLONOSCOPY WITH PROPOFOL;  Surgeon: Ronnette Juniper, MD;  Location: [place];  Service: Gastroenterology;  Laterality: N/A;  . ESOPHAGOGASTRODUODENOSCOPY N/A 08/03/2018   Procedure: ESOPHAGOGASTRODUODENOSCOPY (EGD);  Surgeon: Clarene [facility], MD;  Location: [place];  Service: Endoscopy;  Laterality: N/A;  . ESOPHAGOGASTRODUODENOSCOPY (EGD) WITH PROPOFOL N/A 08/07/2018   Procedure: ESOPHAGOGASTRODUODENOSCOPY (EGD) WITH PROPOFOL;  Surgeon: Ronnette Juniper, MD;  Location: [place];  Service: Gastroenterology;  Laterality: N/A;  . GIVENS CAPSULE  STUDY N/A 08/07/2018   Procedure: GIVENS CAPSULE STUDY;  Surgeon: Ronnette Juniper, MD;  Location: [place];  Service: Gastroenterology;  Laterality: N/A;  . HEMOSTASIS CLIP PLACEMENT  08/07/2018   Procedure: HEMOSTASIS CLIP PLACEMENT;  Surgeon: Ronnette Juniper, MD;  Location: [place];  Service: Gastroenterology;;   . HOT HEMOSTASIS N/A 08/03/2018   Procedure: HOT HEMOSTASIS (ARGON PLASMA COAGULATION/BICAP);  Surgeon: Clarene [place], MD;  Location: [place];  Service: Endoscopy;  Laterality: N/A;  . INSERTION OF DIALYSIS CATHETER Right 09/11/2015   Procedure: INSERTION OF TUNNELED DIALYSIS CATHETER;  Surgeon: Vickie Epley, MD;  [facility];  Service: Vascular;  Laterality: Right;  . INTRAMEDULLARY (IM) NAIL INTERTROCHANTERIC Right 07/20/2012   Procedure: INTRAMEDULLARY (IM) NAIL INTERTROCHANTRIC;  Surgeon: Carole Civil, MD;  Location: [facility];  Service: Orthopedics;  Laterality: Right;  . IR AV DIALY SHUNT INTRO NEEDLE/INTRAC INITIAL W/PTA/STENT/IMG RT Right 07/20/2018  . IR GENERIC HISTORICAL  09/25/2015   IR US GUIDE VASC ACCESS RIGHT 09/25/2015 Sandi Mariscal, MD [facility]  . IR GENERIC HISTORICAL  09/25/2015   IR FLUORO GUIDE CV LINE RIGHT 09/25/2015 Sandi Mariscal, MD [facility]  . IR GENERIC HISTORICAL  09/25/2015   IR REMOVAL TUN CV CATH W/O FL 09/25/2015 Sandi Mariscal, MD [facility]  . IR THROMBECTOMY AV FISTULA W/THROMBOLYSIS/PTA INC/SHUNT/IMG RIGHT Right 09/06/2017  . IR THROMBECTOMY AV FISTULA W/THROMBOLYSIS/PTA INC/SHUNT/IMG RIGHT Right 09/29/2017  . IR THROMBECTOMY AV FISTULA W/THROMBOLYSIS/PTA INC/SHUNT/IMG RIGHT Right 07/20/2018  . IR US GUIDE VASC ACCESS RIGHT  09/06/2017  . IR US GUIDE VASC ACCESS RIGHT  09/29/2017  . IR US GUIDE VASC ACCESS RIGHT  07/20/2018  . IR US GUIDE VASC ACCESS RIGHT  07/20/2018  . SCLEROTHERAPY  08/03/2018   Procedure: Clide Deutscher;  Surgeon: Clarene [place], MD;  Location: [place];  Service: Endoscopy;;     reports that she has never smoked. She has never used smokeless tobacco. She reports that she does not drink alcohol or use drugs.  Allergies  Allergen Reactions  . Grapefruit Flavor [Flavoring Agent] Other (See Comments)    "Combination of this and another med caused a STROKE"    Family History  Problem Relation Age of Onset  . Heart disease Mother   .  Heart disease Father     Prior to Admission medications   Medication Sig Start Date End Date Taking? Authorizing Provider  aspirin EC 81 MG tablet Take 81 mg by mouth daily.   Yes [provider]  atorvastatin (LIPITOR) 20 MG tablet Take 20 mg by mouth daily.   Yes [provider]  escitalopram (LEXAPRO) 5 MG tablet Take 5 mg by mouth daily.    Yes [provider]  loperamide (IMODIUM A-D) 2 MG tablet Take 2 mg by mouth every 6 (six) hours as needed for diarrhea or loose stools.   Yes [provider]  midodrine (PROAMATINE) 10 MG tablet Take 1 tablet (10 mg total) by mouth 3 (three) times daily with meals. 08/19/18  Yes Mikhail, [place], DO  multivitamin (RENA-VIT) TABS tablet Take 1 tablet by mouth at bedtime. Patient taking differently: Take 1 tablet by mouth every evening.  08/19/18  Yes Mikhail, Maryann, DO  Pancrelipase, Lip-Prot-Amyl, (CREON) 6000 units CPEP Take 1 capsule by mouth 3 (three) times daily with meals. 09/27/17  Yes [provider]  pantoprazole (PROTONIX) 40 MG tablet Take 1 tablet (40 mg total) by mouth 2 (two) times daily. 08/19/18  Yes Mikhail, [place], DO  Amino Acids-Protein Hydrolys (FEEDING  SUPPLEMENT, PRO-STAT SUGAR FREE 64,) LIQD Take 30 mLs by mouth 3 (three) times daily with meals. Patient not taking: Reported on 09/24/2018 08/19/18   Cristal Ford, DO    Physical Exam: Vitals:   11/21/2018 1430 11/06/2018 1445 11/09/2018 1500 11/11/2018 1515  BP: (!) 63/40 (!) 89/50 (!) 101/51 (!) 104/51  Pulse: (!) 58 61 62   Resp: 14 15 14 14   Temp:      TempSrc:      SpO2: 96% 100% 100%   Weight:      Height:        Constitutional: NAD, somewhat somnolent Vitals:   10/27/2018 1430 10/31/2018 1445 11/06/2018 1500 11/02/2018 1515  BP: (!) 63/40 (!) 89/50 (!) 101/51 (!) 104/51  Pulse: (!) 58 61 62   Resp: 14 15 14 14   Temp:      TempSrc:      SpO2: 96% 100% 100%   Weight:      Height:       Eyes: lids and conjunctivae normal ENMT:  Mucous membranes are moist.  Neck: normal, supple Respiratory: clear to auscultation bilaterally. Normal respiratory effort. No accessory muscle use.  Currently on room air Cardiovascular: Regular rate and rhythm, no murmurs. No extremity edema. Abdomen: no tenderness, no distention. Bowel sounds positive.  Musculoskeletal:  No joint deformity upper and lower extremities.   Skin: no rashes, lesions, ulcers.  Psychiatric: Difficult to assess given patient condition  Labs on Admission: I have personally reviewed following labs and imaging studies  CBC: Recent Labs  Lab 11/08/2018 1304  WBC 8.5  NEUTROABS 6.6  HGB 11.8*  HCT 38.7  MCV 93.0  PLT 99991111   Basic Metabolic Panel: Recent Labs  Lab 11/14/2018 1304  NA 137  K 4.4  CL 105  CO2 21*  GLUCOSE 178*  BUN 35*  CREATININE 5.53*  CALCIUM 7.2*   GFR: Estimated Creatinine Clearance: 4.8 mL/min (A) (by C-G formula based on SCr of 5.53 mg/dL (H)). Liver Function Tests: Recent Labs  Lab 11/02/2018 1304  AST 22  ALT 14  ALKPHOS 65  BILITOT 0.2*  PROT 5.0*  ALBUMIN 1.6*   No results for input(s): LIPASE, AMYLASE in the last 168 hours. No results for input(s): AMMONIA in the last 168 hours. Coagulation Profile: Recent Labs  Lab 11/08/2018 1304  INR 1.1   Cardiac Enzymes: No results for input(s): CKTOTAL, CKMB, CKMBINDEX, TROPONINI in the last 168 hours. BNP (last 3 results) No results for input(s): PROBNP in the last 8760 hours. HbA1C: No results for input(s): HGBA1C in the last 72 hours. CBG: No results for input(s): GLUCAP in the last 168 hours. Lipid Profile: No results for input(s): CHOL, HDL, LDLCALC, TRIG, CHOLHDL, LDLDIRECT in the last 72 hours. Thyroid Function Tests: No results for input(s): TSH, T4TOTAL, FREET4, T3FREE, THYROIDAB in the last 72 hours. Anemia Panel: No results for input(s): VITAMINB12, FOLATE, FERRITIN, TIBC, IRON, RETICCTPCT in the last 72 hours. Urine analysis:    Component Value  Date/Time   COLORURINE YELLOW 09/01/2018 1857   APPEARANCEUR CLEAR 09/01/2018 1857   LABSPEC 1.012 09/01/2018 1857   PHURINE 6.0 09/01/2018 1857   GLUCOSEU NEGATIVE 09/01/2018 1857   HGBUR NEGATIVE 09/01/2018 1857   BILIRUBINUR NEGATIVE 09/01/2018 1857   KETONESUR NEGATIVE 09/01/2018 1857   PROTEINUR 100 (A) 09/01/2018 1857   UROBILINOGEN 0.2 07/17/2012 1751   NITRITE NEGATIVE 09/01/2018 1857   LEUKOCYTESUR TRACE (A) 09/01/2018 1857    Radiological Exams on Admission: Dg Chest Port 1 [address]  Result Date: 11/09/2018 CLINICAL DATA:  Hypotension and weakness. EXAM: PORTABLE CHEST 1 VIEW COMPARISON:  Chest x-ray dated 09/24/2018 FINDINGS: There is increased pulmonary vascular congestion with increased small to moderate right pleural effusion. Left effusion is essentially unchanged with some of the fluid shifted to the left apex. Overall heart size is normal. Aortic atherosclerosis. No significant bone abnormality. IMPRESSION: 1. Increasing pulmonary vascular congestion with increased small to moderate right pleural effusion. 2. No change in the left effusion. 3. Aortic atherosclerosis. Electronically Signed   By: Lorriane Shire M.D.   On: 11/17/2018 14:11    EKG: Independently reviewed.  62 bpm, sinus rhythm.  Assessment/Plan Active Problems:   Hypotension    Acute hypotension/hypothermia likely secondary to sepsis -Agree with empiric vancomycin and Zosyn -Continue monitoring in [facility] at [facility]; will need CVVHD -Agree with IV fluid bolus -Follow lactic acid level as well as CBC -Follow blood cultures -Central venous line placement per EDP for potential need of pressors to maintain MAP>65 -COVID testing negative  Chronic hypotension -Maintain on midodrine, but may require pressors in the setting of sepsis -Monitor blood pressures carefully in SDU/ICU  CAD with prior MI -Maintain on aspirin and statin  Chronic diastolic CHF -Appears to have signs of volume overload  pulmonary vascular congestion and small to moderate right pleural effusion -Monitor daily weights -Patient will require CVVHD per nephrology as patient has not hemodialyzed this week due to hypotension  Type 2 diabetes with mild hyperglycemia -Maintain on SSI coverage with carb modified diet  ESRD on HD TTS -Appreciate nephrology consultation and recommendations to transfer to [facility] for CVVHD  Anemia of chronic disease secondary to above -Currently stable -No signs of overt bleeding and will repeat CBC in a.m.  Prior CVA -Continue on aspirin and statin -No acute neurologic deficits identified at this time  Severe protein calorie malnutrition/failure to thrive -We will need repeat palliative care evaluation to discuss goals of care -Albumin 1.6 -Continue Prostat  GERD -PPI   DVT prophylaxis: Heparin Code Status: Full Family Communication: Son, [name] at bedside Disposition Plan: Transfer to [facility] ICU per nephrology recommendations as she needs CVVHD Consults called: Nephrology, Dr.Coladonato, Palliative care, Dr. Lamonte Sakai PCCM Admission status: Inpatient, ICU at [facility]; Dr. Lamonte Sakai has accepted patient   [place] DO [facility] Pager [phone]  If 7PM-7AM, please contact night-coverage [email or web address] Password [facility]  11/08/2018, 3:33 PM

## 2018-11-10 NOTE — H&P (Addendum)
NAME:  BARBE THORUP, MRN:  [id number], DOB:  Jan 02, 1941, LOS: 0 ADMISSION DATE:  10/25/2018, CONSULTATION DATE:  11/23/2018   REFERRING MD:  Dr. Manuella Ghazi, CHIEF COMPLAINT:  Hypotension    History of present illness   78 year old female presents to [facility] on 9/17 with increased weakness and fatigue. Reports missing last 2 HD appointments. However, attempted to go today but was unable to complete due to hypotension. On arrival to ED patient reports right foot chronic ulcer tenderness. CXR with pulmonary vascular congestion and bilateral pleural effusions. LA 3. BP 65/49. Temp 94.8. WBC 8.5. Given 2.5 L Fluid and Zosyn. Nephrology consulted. Due to needs for CVVHD transferred to [facility].   Son reports that patient is wheelchair bound and confused at baseline. For the last week with diarrhea and decreased oral intake   Past Medical History  ESRD, CAD, DM, HLD, HTN, CVA with residual right sided weakness, Diastolic HF  Significant Hospital Events   9/17 > [facility] with sepsis/hypotension > Transferred to [facility] for CVVHD  Consults:  Nephrology PCCM   Procedures:  CVC Right IJ 9/17 at [facility] >>  Significant Diagnostic Tests:  CXR 9/17 > 1. Increasing pulmonary vascular congestion with increased small to moderate right pleural effusion. 2. No change in the left effusion. 3. Aortic atherosclerosis. DG Foot 9/17 > Diffuse osteopenia limits evaluation of the osseous structures.Question of a nondisplaced fracture seen at the base of the second proximal phalanx at the MTP joint. Sclerosis of the distal tuft of the first digit could be from chronic osteomyelitis or prior injury.  Micro Data:  Blood 9/17 >>   Antimicrobials:  Zosyn 9/17 >>   Vancomycin 9/17 >>   Interim history/subjective:    Objective   Blood pressure (!) 90/57, pulse 68, temperature (!) 94.8 F (34.9 C), temperature source Rectal, resp. rate 16, height 4\' 11"  (1.499 m), weight 35.9 kg, SpO2 98 %.        Intake/Output Summary  (Last 24 hours) at 11/17/2018 1941 Last data filed at 11/17/2018 1455 Gross per 24 hour  Intake 1470 ml  Output -  Net 1470 ml   Filed Weights   11/09/2018 1259  Weight: 35.9 kg    Examination: General: No distress, lying in bed  HENT: Dry MM  Lungs: Clear breath sounds, diminished to bases  Cardiovascular: RRR, no MRG Abdomen: Soft, non-distended  Extremities: contractures noted to right upper/lower extremities, right upper arm fistula, right heel ulcer    Neuro: opens eyes to stimulation, does not follow commands, pupils intact  GU: intact   Resolved Hospital Problem list     Assessment & Plan:   Encephalopathy in setting of uremia vs shock state CVA with residual right side weakness   Plan -Monitor  -Treatment for infectious process   Hypotension in setting of Septic vs Hypovolemic shock  Chronic Hypotension - On Midodrine  Diastolic Heart Failure  H/O CAD  Plan  -Cardiac Monitoring  -Maintain Systolic [id number] or MAP [id number], Continue Home Midodrine   Non Gap Metabolic Acidosis, Lactic Acidosis  ESRD on HD TTS -Has missed last 2 HD sessions, unable to complete today due to hypotension  Plan -Nephrology Consulted >> Plans for CVVD >> on arrival to [facility] BP [id number] systolic no acute indication for HD, communicated with nephrology with plans to see in AM for possible HD  -Trend LA (3.0>2.5)  -Trend BMP -Replace electrolytes as needed   ?Septic Shock, Hypothermia,  ?Hypovolemia in setting of diarrhea  Chronic Right Heel Ulcer >> with soft tissue around area with bruising and necrosis ? Plan -Follow Culture Data -Send PCT -Trend WBC and Fever Curve -Send GI pathogen panel and C.Diff  -Consult Wound Care  -Continue Vancomycin/Zosyn   Anemia of Chronic Kidney Disease  Plan  -Trend CBC  -Transfuse for hemoglobin <7  DM Plan -Trend Glucose -SSI   GERD Plan -PPI   Best practice:  Diet: NPO DVT prophylaxis: Heparin SQ  GI prophylaxis: Protonix  Glucose  control: SSI Mobility: Bedrest  Code Status: FC Family Communication: Son updated at bedside  Disposition:   Labs   CBC: Recent Labs  Lab 10/28/2018 1304  WBC 8.5  NEUTROABS 6.6  HGB 11.8*  HCT 38.7  MCV 93.0  PLT 99991111    Basic Metabolic Panel: Recent Labs  Lab 11/09/2018 1304  NA 137  K 4.4  CL 105  CO2 21*  GLUCOSE 178*  BUN 35*  CREATININE 5.53*  CALCIUM 7.2*   GFR: Estimated Creatinine Clearance: 4.8 mL/min (A) (by C-G formula based on SCr of 5.53 mg/dL (H)). Recent Labs  Lab 11/08/2018 1304 11/09/2018 1514  WBC 8.5  --   LATICACIDVEN 3.0* 2.5*    Liver Function Tests: Recent Labs  Lab 11/08/2018 1304  AST 22  ALT 14  ALKPHOS 65  BILITOT 0.2*  PROT 5.0*  ALBUMIN 1.6*   No results for input(s): LIPASE, AMYLASE in the last 168 hours. No results for input(s): AMMONIA in the last 168 hours.  ABG    Component Value Date/Time   PHART 7.434 09/01/2018 1941   PCO2ART 42.4 09/01/2018 1941   PO2ART 72.0 (L) 09/01/2018 1941   HCO3 28.4 (H) 09/01/2018 1941   TCO2 30 09/01/2018 1941   ACIDBASEDEF 3.8 (H) 08/04/2018 1345   O2SAT 95.0 09/01/2018 1941     Coagulation Profile: Recent Labs  Lab 11/08/2018 1304  INR 1.1    Cardiac Enzymes: No results for input(s): CKTOTAL, CKMB, CKMBINDEX, TROPONINI in the last 168 hours.  HbA1C: Hgb A1c MFr Bld  Date/Time Value Ref Range Status  07/14/2017 04:32 AM 4.3 (L) 4.8 - 5.6 % Final    Comment:    (NOTE) Pre diabetes:          5.7%-6.4% Diabetes:              >6.4% Glycemic control for   <7.0% adults with diabetes   07/17/2012 03:50 PM 5.9 (H) <5.7 % Final    Comment:    (NOTE)                                                                       According to the ADA Clinical Practice Recommendations for 2011, when HbA1c is used as a screening test:  >=6.5%   Diagnostic of Diabetes Mellitus           (if abnormal result is confirmed) 5.7-6.4%   Increased risk of developing Diabetes Mellitus  References:Diagnosis and Classification of Diabetes Mellitus,Diabetes [id number] 1):S62-S69 and Standards of Medical Care in         Diabetes - 2011,Diabetes [id number] (Suppl 1):S11-S61.    CBG: No results for input(s): GLUCAP in the last 168 hours.  Review of Systems:   Unable to review as  patient is confused   Past Medical History  She,  has a past medical history of Anemia (07/2018), CHF (congestive heart failure) ([place]), Coronary artery disease, Diabetes mellitus without complication ([place]), ESRD on dialysis ([place]) (02/10/2017), Hyperlipidemia, Hypertension, Myocardial infarct ([place]), and Stroke ([place]).   Surgical History    Past Surgical History:  Procedure Laterality Date  . APPENDECTOMY    . AV FISTULA PLACEMENT Right 09/20/2015   Procedure: PLACEMENT OF RIGHT UPPER EXTREMITY ARTERIOVENOUS GORE-TEX GRAFT FOR HEMODIALYSIS ACCESS;  Surgeon: Vickie Epley, MD;  Location: [facility];  Service: Vascular;  Laterality: Right;  . CATARACT EXTRACTION W/PHACO Left 06/29/2013   Procedure: CATARACT EXTRACTION PHACO AND INTRAOCULAR LENS PLACEMENT (IOC);  Surgeon: Tonny Branch, MD;  Location: [facility];  Service: Ophthalmology;  Laterality: Left;  CDE 12.25  . CATARACT EXTRACTION W/PHACO Right 07/27/2013   Procedure: CATARACT EXTRACTION PHACO AND INTRAOCULAR LENS PLACEMENT (IOC);  Surgeon: Tonny Branch, MD;  Location: [facility];  Service: Ophthalmology;  Laterality: Right;  CDE:7.72  . COLONOSCOPY WITH PROPOFOL N/A 08/07/2018   Procedure: COLONOSCOPY WITH PROPOFOL;  Surgeon: Ronnette Juniper, MD;  Location: [place];  Service: Gastroenterology;  Laterality: N/A;  . ESOPHAGOGASTRODUODENOSCOPY N/A 08/03/2018   Procedure: ESOPHAGOGASTRODUODENOSCOPY (EGD);  Surgeon: Clarene Essex, MD;  Location: [place];  Service: Endoscopy;  Laterality: N/A;  . ESOPHAGOGASTRODUODENOSCOPY (EGD) WITH PROPOFOL N/A 08/07/2018   Procedure: ESOPHAGOGASTRODUODENOSCOPY (EGD) WITH PROPOFOL;  Surgeon: Ronnette Juniper, MD;  Location: [place];  Service: Gastroenterology;  Laterality: N/A;  . GIVENS CAPSULE STUDY N/A 08/07/2018   Procedure: GIVENS CAPSULE STUDY;  Surgeon: Ronnette Juniper, MD;  Location: [place];  Service: Gastroenterology;  Laterality: N/A;  . HEMOSTASIS CLIP PLACEMENT  08/07/2018   Procedure: HEMOSTASIS CLIP PLACEMENT;  Surgeon: Ronnette Juniper, MD;  Location: [place];  Service: Gastroenterology;;  . HOT HEMOSTASIS N/A 08/03/2018   Procedure: HOT HEMOSTASIS (ARGON PLASMA COAGULATION/BICAP);  Surgeon: Clarene Essex, MD;  Location: [place];  Service: Endoscopy;  Laterality: N/A;  . INSERTION OF DIALYSIS CATHETER Right 09/11/2015   Procedure: INSERTION OF TUNNELED DIALYSIS CATHETER;  Surgeon: Vickie Epley, MD;  Location: [facility];  Service: Vascular;  Laterality: Right;  . INTRAMEDULLARY (IM) NAIL INTERTROCHANTERIC Right 07/20/2012   Procedure: INTRAMEDULLARY (IM) NAIL INTERTROCHANTRIC;  Surgeon: Carole Civil, MD;  Location: [facility];  Service: Orthopedics;  Laterality: Right;  . IR AV DIALY SHUNT INTRO NEEDLE/INTRAC INITIAL W/PTA/STENT/IMG RT Right 07/20/2018  . IR GENERIC HISTORICAL  09/25/2015   IR US GUIDE VASC ACCESS RIGHT 09/25/2015 Sandi Mariscal, MD [facility]  . IR GENERIC HISTORICAL  09/25/2015   IR FLUORO GUIDE CV LINE RIGHT 09/25/2015 Sandi Mariscal, MD [facility]  . IR GENERIC HISTORICAL  09/25/2015   IR REMOVAL TUN CV CATH W/O FL 09/25/2015 Sandi Mariscal, MD [facility]  . IR THROMBECTOMY AV FISTULA W/THROMBOLYSIS/PTA INC/SHUNT/IMG RIGHT Right 09/06/2017  . IR THROMBECTOMY AV FISTULA W/THROMBOLYSIS/PTA INC/SHUNT/IMG RIGHT Right 09/29/2017  . IR THROMBECTOMY AV FISTULA W/THROMBOLYSIS/PTA INC/SHUNT/IMG RIGHT Right 07/20/2018  . IR US GUIDE VASC ACCESS RIGHT  09/06/2017  . IR US GUIDE VASC ACCESS RIGHT  09/29/2017  . IR US GUIDE VASC ACCESS RIGHT  07/20/2018  . IR US GUIDE VASC ACCESS RIGHT  07/20/2018  . SCLEROTHERAPY  08/03/2018   Procedure: Clide Deutscher;  Surgeon: Clarene Essex, MD;  Location: [facility] ENDOSCOPY;   Service: Endoscopy;;     Social History   reports that she has never smoked. She has never used smokeless tobacco. She reports that she does not drink  alcohol or use drugs.   Family History   Her family history includes Heart disease in her father and mother.   Allergies Allergies  Allergen Reactions  . Grapefruit Flavor [Flavoring Agent] Other (See Comments)    "Combination of this and another med caused a STROKE"     Home Medications  Prior to Admission medications   Medication Sig Start Date End Date Taking? Authorizing Provider  aspirin EC 81 MG tablet Take 81 mg by mouth daily.   Yes [provider]  atorvastatin (LIPITOR) 20 MG tablet Take 20 mg by mouth daily.   Yes [provider]  escitalopram (LEXAPRO) 5 MG tablet Take 5 mg by mouth daily.    Yes [provider]  loperamide (IMODIUM A-D) 2 MG tablet Take 2 mg by mouth every 6 (six) hours as needed for diarrhea or loose stools.   Yes [provider]  midodrine (PROAMATINE) 10 MG tablet Take 1 tablet (10 mg total) by mouth 3 (three) times daily with meals. 08/19/18  Yes Mikhail, [place], DO  multivitamin (RENA-VIT) TABS tablet Take 1 tablet by mouth at bedtime. Patient taking differently: Take 1 tablet by mouth every evening.  08/19/18  Yes Mikhail, Maryann, DO  Pancrelipase, Lip-Prot-Amyl, (CREON) 6000 units CPEP Take 1 capsule by mouth 3 (three) times daily with meals. 09/27/17  Yes [provider]  pantoprazole (PROTONIX) 40 MG tablet Take 1 tablet (40 mg total) by mouth 2 (two) times daily. 08/19/18  Yes Mikhail, Clinical biochemist, DO  Amino Acids-Protein Hydrolys (FEEDING SUPPLEMENT, PRO-STAT SUGAR FREE 64,) LIQD Take 30 mLs by mouth 3 (three) times daily with meals. Patient not taking: Reported on 09/24/2018 08/19/18   Cristal Ford, DO     Critical care time: 42 minutes    Patient personally seen examined chart reviewed case discussed with son.  Agree with above assessment and plan  discussed with NP Eubanks.  Will hold in ICU tonight with pressors as necessar and await Nephrology evaluation and plan in am.  Over 35 minutes spent in evaluation and critical care planning.  Dr Shellia Cleverly MD   Hayden Pedro, AGACNP-BC [facility]  PCCM Pgr: [phone]

## 2018-11-10 NOTE — ED Notes (Signed)
Lost IV access. EDP notified.

## 2018-11-10 NOTE — ED Notes (Signed)
Date and time results received: 10/29/2018 1414  Test: Lactic Acid Critical Value: 3.0  Name of Provider Notified: Dr. Angelina Pih  Orders Received? Or Actions Taken?: See chart

## 2018-11-10 NOTE — ED Notes (Signed)
Obtained one set of blood cultures from IV stick placed by EDP. Pt is a very difficult stick. Will go ahead and start IV antibiotics in order to not delay care.

## 2018-11-10 NOTE — ED Notes (Signed)
Lab at bedside

## 2018-11-10 NOTE — ED Notes (Signed)
PT moved to larger room in order for central line placement.

## 2018-11-10 NOTE — ED Provider Notes (Addendum)
[facility] EMERGENCY DEPARTMENT Provider Note   CSN: [id number] Arrival date & time: 11/06/2018  1241     History   Chief Complaint Chief Complaint  Patient presents with   Hypotension    HPI Courtney [name] is a 78 y.o. female.     Patient with history of congestive heart failure, coronary artery disease, end-stage renal disease on dialysis, due for dialysis today however sent over due to hypotension and general weakness.  Patient did not undergo dialysis.  Family takes care of patient at their home and she has not had any fever cough or infectious symptoms.  Patient has complained about right foot ulcer tenderness for which she is had before.  Patient is currently full code.  No recent bleeding.     Past Medical History:  Diagnosis Date   Anemia 07/2018   CHF (congestive heart failure) (HCC)    Coronary artery disease    Diabetes mellitus without complication ([place])    ESRD on dialysis ([place]) 02/10/2017   Hyperlipidemia    Hypertension    Myocardial infarct [facility])    Stroke ([place])    2-3 yrs ago- right sided weakness    Patient Active Problem List   Diagnosis Date Noted   Hypotension 10/31/2018   FTT (failure to thrive) in adult    Recurrent pleural effusion on left 09/01/2018   Chronic diastolic CHF (congestive heart failure) ([place]) 09/01/2018   History of stroke 09/01/2018   Excessive somnolence disorder 09/01/2018   Goals of care, counseling/discussion    Palliative care by specialist    Protein-calorie malnutrition, severe 08/09/2018   Hypotensive episode 08/07/2018   GI bleed 08/07/2018   Shock circulatory (HCC)    S/P thoracentesis    Pneumothorax on left    Pressure injury of skin 08/04/2018   Symptomatic anemia 07/30/2018   Acute CVA (cerebrovascular accident) ([place]) 07/13/2017   Hypokalemia 07/13/2017   Anemia in ESRD (end-stage renal disease) ([place]) 07/13/2017   Hyperlipidemia 07/13/2017   Coronary artery disease 07/13/2017     Type 2 diabetes mellitus ([place]) 07/13/2017   ESRD on dialysis ([place]) 02/10/2017   Intertrochanteric fracture of right hip ([place]) 08/16/2012   Syncope 07/17/2012   Closed right hip fracture ([place]) 07/17/2012   HTN (hypertension) 07/17/2012   Cerebrovascular disease 07/17/2012    Past Surgical History:  Procedure Laterality Date   APPENDECTOMY     AV FISTULA PLACEMENT Right 09/20/2015   Procedure: PLACEMENT OF RIGHT UPPER EXTREMITY ARTERIOVENOUS GORE-TEX GRAFT FOR HEMODIALYSIS ACCESS;  Surgeon: Vickie Epley, MD;  Location: [facility];  Service: Vascular;  Laterality: Right;   CATARACT EXTRACTION W/PHACO Left 06/29/2013   Procedure: CATARACT EXTRACTION PHACO AND INTRAOCULAR LENS PLACEMENT ([place]);  Surgeon: Tonny Branch, MD;  Location: [facility];  Service: Ophthalmology;  Laterality: Left;  CDE 12.25   CATARACT EXTRACTION W/PHACO Right 07/27/2013   Procedure: CATARACT EXTRACTION PHACO AND INTRAOCULAR LENS PLACEMENT (IOC);  Surgeon: Tonny Branch, MD;  Location: [facility];  Service: Ophthalmology;  Laterality: Right;  CDE:7.72   COLONOSCOPY WITH PROPOFOL N/A 08/07/2018   Procedure: COLONOSCOPY WITH PROPOFOL;  Surgeon: Ronnette Juniper, MD;  Location: [place];  Service: Gastroenterology;  Laterality: N/A;   ESOPHAGOGASTRODUODENOSCOPY N/A 08/03/2018   Procedure: ESOPHAGOGASTRODUODENOSCOPY (EGD);  Surgeon: Clarene Essex, MD;  Location: [place];  Service: Endoscopy;  Laterality: N/A;   ESOPHAGOGASTRODUODENOSCOPY (EGD) WITH PROPOFOL N/A 08/07/2018   Procedure: ESOPHAGOGASTRODUODENOSCOPY (EGD) WITH PROPOFOL;  Surgeon: Ronnette Juniper, MD;  Location: [place];  Service: Gastroenterology;  Laterality: N/A;  GIVENS CAPSULE STUDY N/A 08/07/2018   Procedure: GIVENS CAPSULE STUDY;  Surgeon: Ronnette Juniper, MD;  Location: [place];  Service: Gastroenterology;  Laterality: N/A;   HEMOSTASIS CLIP PLACEMENT  08/07/2018   Procedure: HEMOSTASIS CLIP PLACEMENT;  Surgeon: Ronnette Juniper, MD;  Location: [place];   Service: Gastroenterology;;   HOT HEMOSTASIS N/A 08/03/2018   Procedure: HOT HEMOSTASIS (ARGON PLASMA COAGULATION/BICAP);  Surgeon: Clarene Essex, MD;  Location: [place];  Service: Endoscopy;  Laterality: N/A;   INSERTION OF DIALYSIS CATHETER Right 09/11/2015   Procedure: INSERTION OF TUNNELED DIALYSIS CATHETER;  Surgeon: Vickie Epley, MD;  Location: [facility];  Service: Vascular;  Laterality: Right;   INTRAMEDULLARY (IM) NAIL INTERTROCHANTERIC Right 07/20/2012   Procedure: INTRAMEDULLARY (IM) NAIL INTERTROCHANTRIC;  Surgeon: Carole Civil, MD;  Location: [facility];  Service: Orthopedics;  Laterality: Right;   IR AV DIALY SHUNT INTRO NEEDLE/INTRAC INITIAL W/PTA/STENT/IMG RT Right 07/20/2018   IR GENERIC HISTORICAL  09/25/2015   IR US GUIDE VASC ACCESS RIGHT 09/25/2015 Sandi Mariscal, MD [facility]   IR GENERIC HISTORICAL  09/25/2015   IR FLUORO GUIDE CV LINE RIGHT 09/25/2015 Sandi Mariscal, MD [facility]   IR GENERIC HISTORICAL  09/25/2015   IR REMOVAL TUN CV CATH W/O FL 09/25/2015 Sandi Mariscal, MD [facility]   IR THROMBECTOMY AV FISTULA W/THROMBOLYSIS/PTA INC/SHUNT/IMG RIGHT Right 09/06/2017   IR THROMBECTOMY AV FISTULA W/THROMBOLYSIS/PTA INC/SHUNT/IMG RIGHT Right 09/29/2017   IR THROMBECTOMY AV FISTULA W/THROMBOLYSIS/PTA INC/SHUNT/IMG RIGHT Right 07/20/2018   IR US GUIDE VASC ACCESS RIGHT  09/06/2017   IR US GUIDE VASC ACCESS RIGHT  09/29/2017   IR US GUIDE VASC ACCESS RIGHT  07/20/2018   IR US GUIDE VASC ACCESS RIGHT  07/20/2018   SCLEROTHERAPY  08/03/2018   Procedure: Clide Deutscher;  Surgeon: Clarene Essex, MD;  Location[facility];  Service: Endoscopy;;     OB History   No obstetric history on file.      Home Medications    Prior to Admission medications   Medication Sig Start Date End Date Taking? Authorizing Provider  aspirin EC 81 MG tablet Take 81 mg by mouth daily.   Yes [provider]  atorvastatin (LIPITOR) 20 MG tablet Take 20 mg by mouth daily.   Yes [provider]  escitalopram (LEXAPRO) 5 MG tablet Take 5 mg by mouth daily.    Yes [provider]  loperamide (IMODIUM A-D) 2 MG tablet Take 2 mg by mouth every 6 (six) hours as needed for diarrhea or loose stools.   Yes [provider]  midodrine (PROAMATINE) 10 MG tablet Take 1 tablet (10 mg total) by mouth 3 (three) times daily with meals. 08/19/18  Yes Mikhail, Kiron, DO  multivitamin (RENA-VIT) TABS tablet Take 1 tablet by mouth at bedtime. Patient taking differently: Take 1 tablet by mouth every evening.  08/19/18  Yes Mikhail, Maryann, DO  Pancrelipase, Lip-Prot-Amyl, (CREON) 6000 units CPEP Take 1 capsule by mouth 3 (three) times daily with meals. 09/27/17  Yes [provider]  pantoprazole (PROTONIX) 40 MG tablet Take 1 tablet (40 mg total) by mouth 2 (two) times daily. 08/19/18  Yes Mikhail, Clinical biochemist, DO  Amino Acids-Protein Hydrolys (FEEDING SUPPLEMENT, PRO-STAT SUGAR FREE 64,) LIQD Take 30 mLs by mouth 3 (three) times daily with meals. Patient not taking: Reported on 09/24/2018 08/19/18   Cristal Ford, DO    Family History Family History  Problem Relation Age of Onset   Heart disease Mother    Heart disease Father  Social History Social History   Tobacco Use   Smoking status: Never Smoker   Smokeless tobacco: Never Used  Substance Use Topics   Alcohol use: No   Drug use: No     Allergies   Grapefruit flavor [flavoring agent]   Review of Systems Review of Systems  Unable to perform ROS: Mental status change     Physical Exam Updated Vital Signs BP (!) 104/51    Pulse 62    Temp (!) 94.8 F (34.9 C) (Rectal)    Resp 14    Ht 4\' 11"  (1.499 m)    Wt 35.9 kg    SpO2 100%    BMI 15.99 kg/m   Physical Exam Vitals signs and nursing note reviewed.  Constitutional:      Appearance: She is well-developed. She is ill-appearing.  HENT:     Head: Normocephalic and atraumatic.     Comments: Dry mm Eyes:     General:        Right  eye: No discharge.        Left eye: No discharge.     Conjunctiva/sclera: Conjunctivae normal.  Neck:     Musculoskeletal: Normal range of motion and neck supple.     Trachea: No tracheal deviation.  Cardiovascular:     Rate and Rhythm: Normal rate and regular rhythm.  Pulmonary:     Effort: Pulmonary effort is normal.     Breath sounds: Rhonchi present.  Abdominal:     General: There is no distension.     Palpations: Abdomen is soft.     Tenderness: There is no abdominal tenderness. There is no guarding.  Skin:    General: Skin is warm.     Findings: Rash present.     Comments: approx 3 cm right foot ulcer, mild tender, non draining, plantar aspect  Neurological:     Mental Status: She is alert and oriented to person, place, and time.     GCS: GCS eye subscore is 4. GCS verbal subscore is 4. GCS motor subscore is 6.     Motor: Weakness present.     Comments: gen weakness      ED Treatments / Results  Labs (all labs ordered are listed, but only abnormal results are displayed) Labs Reviewed  COMPREHENSIVE METABOLIC PANEL - Abnormal; Notable for the following components:      Result Value   CO2 21 (*)    Glucose, Bld 178 (*)    BUN 35 (*)    Creatinine, Ser 5.53 (*)    Calcium 7.2 (*)    Total Protein 5.0 (*)    Albumin 1.6 (*)    Total Bilirubin 0.2 (*)    GFR calc non Af Amer 7 (*)    GFR calc Af Amer 8 (*)    All other components within normal limits  LACTIC ACID, PLASMA - Abnormal; Notable for the following components:   Lactic Acid, Venous 3.0 (*)    All other components within normal limits  CBC WITH DIFFERENTIAL/PLATELET - Abnormal; Notable for the following components:   Hemoglobin 11.8 (*)    RDW 22.7 (*)    All other components within normal limits  CULTURE, BLOOD (ROUTINE X 2)  CULTURE, BLOOD (ROUTINE X 2)  SARS CORONAVIRUS 2 (HOSPITAL ORDER, [facility])  PROTIME-INR  LACTIC ACID, PLASMA  URINALYSIS, ROUTINE W REFLEX  MICROSCOPIC  TSH  T4, FREE  TYPE AND SCREEN    EKG None  Radiology  Dg Chest Port 1 View  Result Date: 11/09/2018 CLINICAL DATA:  Hypotension and weakness. EXAM: PORTABLE CHEST 1 VIEW COMPARISON:  Chest x-ray dated 09/24/2018 FINDINGS: There is increased pulmonary vascular congestion with increased small to moderate right pleural effusion. Left effusion is essentially unchanged with some of the fluid shifted to the left apex. Overall heart size is normal. Aortic atherosclerosis. No significant bone abnormality. IMPRESSION: 1. Increasing pulmonary vascular congestion with increased small to moderate right pleural effusion. 2. No change in the left effusion. 3. Aortic atherosclerosis. Electronically Signed   By: Lorriane Shire M.D.   On: 11/23/2018 14:11    Procedures .Critical Care Performed by: Elnora Morrison, MD Authorized by: Elnora Morrison, MD   Critical care provider statement:    Critical care time (minutes):  75   Critical care start time:  11/13/2018 2:00 PM   Critical care end time:  11/20/2018 3:15 PM   Critical care time was exclusive of:  Separately billable procedures and treating other patients and teaching time   Critical care was necessary to treat or prevent imminent or life-threatening deterioration of the following conditions:  Sepsis   Critical care was time spent personally by me on the following activities:  Discussions with consultants, evaluation of patient's response to treatment, examination of patient, ordering and performing treatments and interventions, ordering and review of laboratory studies, ordering and review of radiographic studies, pulse oximetry, re-evaluation of patient's condition, obtaining history from patient or surrogate and review of old charts Ultrasound ED Peripheral IV (Provider)  Date/Time: 11/03/2018 3:24 PM Performed by: Elnora Morrison, MD Authorized by: Elnora Morrison, MD   Procedure details:    Indications: hypotension     Location:   Left AC   Angiocath:  20 G   Bedside Ultrasound Guided: Yes     Images: archived     Patient tolerated procedure without complications: Yes     Dressing applied: Yes   Ultrasound ED Peripheral IV (Provider)  Date/Time: 11/16/2018 8:55 AM Performed by: Elnora Morrison, MD Authorized by: Elnora Morrison, MD   Procedure details:    Indications: hypotension and multiple failed IV attempts     Location:  Left AC   Angiocath:  20 G   Bedside Ultrasound Guided: Yes     Images: archived     Patient tolerated procedure without complications: Yes     (including critical care time)  Medications Ordered in ED Medications  vancomycin (VANCOCIN) IVPB 750 mg/150 ml premix (has no administration in time range)  sodium chloride 0.9 % bolus 500 mL (0 mLs Intravenous Stopped 11/15/2018 1450)  piperacillin-tazobactam (ZOSYN) IVPB 3.375 g (0 g Intravenous Stopped 11/13/2018 1455)  sodium chloride 0.9 % bolus 500 mL (500 mLs Intravenous New Bag/Given 10/28/2018 1422)     Initial Impression / Assessment and Plan / ED Course  I have reviewed the triage vital signs and the nursing notes.  Pertinent labs & imaging results that were available during my care of the patient were reviewed by me and considered in my medical decision making (see chart for details).       Patient presents with hypotension since arrival dialysis.  Concern for sepsis with chronical medical conditions, foot ulcer.  Chest x-ray reviewed congestion.  Patient normal work of breathing not requiring oxygen.  Patient has hypothermia, hypotension.  Code sepsis initiated.  Lactate of 3.  Patient received 30 cc/kg of fluids.  Broad antibiotics ordered.  Patient's blood pressure improved to [id number] systolic.  Patient is a full  code discussed with family.  Hemoglobin 11, normal white blood cell count.  Plan for admission to the ICU.  Patient difficult IV ultrasound-guided initially.  The patients results and plan were reviewed and discussed.   Any  x-rays performed were independently reviewed by myself.   Differential diagnosis were considered with the presenting HPI.  Medications  vancomycin (VANCOCIN) IVPB 750 mg/150 ml premix (has no administration in time range)  sodium chloride 0.9 % bolus 500 mL (0 mLs Intravenous Stopped 11/14/2018 1450)  piperacillin-tazobactam (ZOSYN) IVPB 3.375 g (0 g Intravenous Stopped 11/11/2018 1455)  sodium chloride 0.9 % bolus 500 mL (500 mLs Intravenous New Bag/Given 10/27/2018 1422)    Vitals:   11/12/2018 1430 11/17/2018 1445 11/07/2018 1500 10/25/2018 1515  BP: (!) 63/40 (!) 89/50 (!) 101/51 (!) 104/51  Pulse: (!) 58 61 62   Resp: 14 15 14 14   Temp:      TempSrc:      SpO2: 96% 100% 100%   Weight:      Height:        Final diagnoses:  Sepsis associated hypotension (HCC)  Chronic renal failure, stage 5 (HCC)  General weakness  Hypothermia, initial encounter    Admission/ observation were discussed with the admitting physician, patient and/or family and they are comfortable with the plan.     Final Clinical Impressions(s) / ED Diagnoses   Final diagnoses:  Sepsis associated hypotension ([place])  Chronic renal failure, stage 5 (HCC)  General weakness  Hypothermia, initial encounter    ED Discharge Orders    None       Elnora Morrison, MD 11/17/2018 1525    Elnora Morrison, MD 11/16/18 [phone]

## 2018-11-10 NOTE — ED Notes (Signed)
X-ray at bedside

## 2018-11-10 NOTE — Consult Note (Signed)
[facility] Renal Consultation Note    Indication for Consultation:  Management of ESRD/hemodialysis; anemia, hypertension/volume and secondary hyperparathyroidism  HPI: Courtney [name] is a 78 y.o. female.  Courtney [name] has a PMH significant for CAD s/p MI, HTN, HLD, CVA, DM type 2, severe protein-calorie malnutrition, chronic hypotension on midodrine, and ESRD on HD qTTS at [facility] who has not been able to undergo her usual outpatient dialysis this week due to persistent hypotension.  She was sent from the HD unit to [facility] ED due to her hypotension with SBP's in the 60's.  Her son is at the bedside and states that they were afraid to start her on HD due to her low BP's.  He also states that she has been more lethargic and has a right heel ulcer.  He also admits that she has had diarrhea for the past week.  She is somnolent and not verbal at this time.  She was given 1 liters of NS IV and her BP improved to SBP of 100 but is now back down to 91.  CXR with some pulmonary vascular congestion and bilateral pleural effusions.  His son wants everything to be done and she remains a full code.  We were consulted to see if she would require transfer to [facility] for CVVHD.   Past Medical History:  Diagnosis Date  . Anemia 07/2018  . CHF (congestive heart failure) ([place])   . Coronary artery disease   . Diabetes mellitus without complication ([place])   . ESRD on dialysis ([place]) 02/10/2017  . Hyperlipidemia   . Hypertension   . Myocardial infarct ([place])   . Stroke [facility])    2-3 yrs ago- right sided weakness   Past Surgical History:  Procedure Laterality Date  . APPENDECTOMY    . AV FISTULA PLACEMENT Right 09/20/2015   Procedure: PLACEMENT OF RIGHT UPPER EXTREMITY ARTERIOVENOUS GORE-TEX GRAFT FOR HEMODIALYSIS ACCESS;  Surgeon: Vickie Epley, MD;  Location: [facility];  Service: Vascular;  Laterality: Right;  . CATARACT EXTRACTION W/PHACO Left 06/29/2013   Procedure: CATARACT EXTRACTION PHACO AND INTRAOCULAR  LENS PLACEMENT (IOC);  Surgeon: Tonny Branch, MD;  Location: [facility];  Service: Ophthalmology;  Laterality: Left;  CDE 12.25  . CATARACT EXTRACTION W/PHACO Right 07/27/2013   Procedure: CATARACT EXTRACTION PHACO AND INTRAOCULAR LENS PLACEMENT (IOC);  Surgeon: Tonny Branch, MD;  Location: [facility];  Service: Ophthalmology;  Laterality: Right;  CDE:7.72  . COLONOSCOPY WITH PROPOFOL N/A 08/07/2018   Procedure: COLONOSCOPY WITH PROPOFOL;  Surgeon: Ronnette Juniper, MD;  Location: [place];  Service: Gastroenterology;  Laterality: N/A;  . ESOPHAGOGASTRODUODENOSCOPY N/A 08/03/2018   Procedure: ESOPHAGOGASTRODUODENOSCOPY (EGD);  Surgeon: Clarene Essex, MD;  Location: [place];  Service: Endoscopy;  Laterality: N/A;  . ESOPHAGOGASTRODUODENOSCOPY (EGD) WITH PROPOFOL N/A 08/07/2018   Procedure: ESOPHAGOGASTRODUODENOSCOPY (EGD) WITH PROPOFOL;  Surgeon: Ronnette Juniper, MD;  Location: [place];  Service: Gastroenterology;  Laterality: N/A;  . GIVENS CAPSULE STUDY N/A 08/07/2018   Procedure: GIVENS CAPSULE STUDY;  Surgeon: Ronnette Juniper, MD;  Location: [place];  Service: Gastroenterology;  Laterality: N/A;  . HEMOSTASIS CLIP PLACEMENT  08/07/2018   Procedure: HEMOSTASIS CLIP PLACEMENT;  Surgeon: Ronnette Juniper, MD;  Location: [place];  Service: Gastroenterology;;  . HOT HEMOSTASIS N/A 08/03/2018   Procedure: HOT HEMOSTASIS (ARGON PLASMA COAGULATION/BICAP);  Surgeon: Clarene Essex, MD;  Location: [place];  Service: Endoscopy;  Laterality: N/A;  . INSERTION OF DIALYSIS CATHETER Right 09/11/2015   Procedure: INSERTION OF TUNNELED DIALYSIS CATHETER;  Surgeon:  Vickie Epley, MD;  Location: [facility];  Service: Vascular;  Laterality: Right;  . INTRAMEDULLARY (IM) NAIL INTERTROCHANTERIC Right 07/20/2012   Procedure: INTRAMEDULLARY (IM) NAIL INTERTROCHANTRIC;  Surgeon: Carole Civil, MD;  Location: [facility];  Service: Orthopedics;  Laterality: Right;  . IR AV DIALY SHUNT INTRO NEEDLE/INTRAC INITIAL W/PTA/STENT/IMG RT Right  07/20/2018  . IR GENERIC HISTORICAL  09/25/2015   IR US GUIDE VASC ACCESS RIGHT 09/25/2015 Sandi Mariscal, MD [facility]  . IR GENERIC HISTORICAL  09/25/2015   IR FLUORO GUIDE CV LINE RIGHT 09/25/2015 Sandi Mariscal, MD [facility]  . IR GENERIC HISTORICAL  09/25/2015   IR REMOVAL TUN CV CATH W/O FL 09/25/2015 Sandi Mariscal, MD [facility]  . IR THROMBECTOMY AV FISTULA W/THROMBOLYSIS/PTA INC/SHUNT/IMG RIGHT Right 09/06/2017  . IR THROMBECTOMY AV FISTULA W/THROMBOLYSIS/PTA INC/SHUNT/IMG RIGHT Right 09/29/2017  . IR THROMBECTOMY AV FISTULA W/THROMBOLYSIS/PTA INC/SHUNT/IMG RIGHT Right 07/20/2018  . IR US GUIDE VASC ACCESS RIGHT  09/06/2017  . IR US GUIDE VASC ACCESS RIGHT  09/29/2017  . IR US GUIDE VASC ACCESS RIGHT  07/20/2018  . IR US GUIDE VASC ACCESS RIGHT  07/20/2018  . SCLEROTHERAPY  08/03/2018   Procedure: Clide Deutscher;  Surgeon: Clarene Essex, MD;  Location: [facility] ENDOSCOPY;  Service: Endoscopy;;   Family History:   Family History  Problem Relation Age of Onset  . Heart disease Mother   . Heart disease Father    Social History:  reports that she has never smoked. She has never used smokeless tobacco. She reports that she does not drink alcohol or use drugs. Allergies  Allergen Reactions  . Grapefruit Flavor [Flavoring Agent] Other (See Comments)    "Combination of this and another med caused a STROKE"   Prior to Admission medications   Medication Sig Start Date End Date Taking? Authorizing Provider  aspirin EC 81 MG tablet Take 81 mg by mouth daily.   Yes [provider]  atorvastatin (LIPITOR) 20 MG tablet Take 20 mg by mouth daily.   Yes [provider]  escitalopram (LEXAPRO) 5 MG tablet Take 5 mg by mouth daily.    Yes [provider]  loperamide (IMODIUM A-D) 2 MG tablet Take 2 mg by mouth every 6 (six) hours as needed for diarrhea or loose stools.   Yes [provider]  midodrine (PROAMATINE) 10 MG tablet Take 1 tablet (10 mg total) by mouth 3 (three) times daily  with meals. 08/19/18  Yes Mikhail, [place], DO  multivitamin (RENA-VIT) TABS tablet Take 1 tablet by mouth at bedtime. Patient taking differently: Take 1 tablet by mouth every evening.  08/19/18  Yes Mikhail, Maryann, DO  Pancrelipase, Lip-Prot-Amyl, (CREON) 6000 units CPEP Take 1 capsule by mouth 3 (three) times daily with meals. 09/27/17  Yes [provider]  pantoprazole (PROTONIX) 40 MG tablet Take 1 tablet (40 mg total) by mouth 2 (two) times daily. 08/19/18  Yes Mikhail, Clinical biochemist, DO  Amino Acids-Protein Hydrolys (FEEDING SUPPLEMENT, PRO-STAT SUGAR FREE 64,) LIQD Take 30 mLs by mouth 3 (three) times daily with meals. Patient not taking: Reported on 09/24/2018 08/19/18   Cristal Ford, DO   Current Facility-Administered Medications  Medication Dose Route Frequency Provider Last Rate Last Dose  . aspirin EC tablet 81 mg  81 mg Oral Daily Manuella Ghazi, Pratik D, DO      . atorvastatin (LIPITOR) tablet 20 mg  20 mg Oral Daily Shah, Pratik D, DO      . escitalopram (LEXAPRO) tablet 5 mg  5 mg  Oral Daily Manuella Ghazi, Pratik D, DO      . feeding supplement (PRO-STAT SUGAR FREE 64) liquid 30 mL  30 mL Oral TID WC Shah, Pratik D, DO      . midodrine (PROAMATINE) tablet 10 mg  10 mg Oral TID WC Shah, Pratik D, DO      . multivitamin (RENA-VIT) tablet 1 tablet  1 tablet Oral QHS Shah, Pratik D, DO      . Pancrelipase (Lip-Prot-Amyl) CPEP 6,000 Units  1 capsule Oral TID WC Shah, Pratik D, DO      . pantoprazole (PROTONIX) EC tablet 40 mg  40 mg Oral BID Manuella Ghazi, Pratik D, DO      . vancomycin (VANCOCIN) IVPB 750 mg/150 ml premix  750 mg Intravenous Once Elnora Morrison, MD       Current Outpatient Medications  Medication Sig Dispense Refill  . aspirin EC 81 MG tablet Take 81 mg by mouth daily.    Marland Kitchen atorvastatin (LIPITOR) 20 MG tablet Take 20 mg by mouth daily.    Marland Kitchen escitalopram (LEXAPRO) 5 MG tablet Take 5 mg by mouth daily.     Marland Kitchen loperamide (IMODIUM A-D) 2 MG tablet Take 2 mg by mouth every 6 (six) hours as  needed for diarrhea or loose stools.    . midodrine (PROAMATINE) 10 MG tablet Take 1 tablet (10 mg total) by mouth 3 (three) times daily with meals. 90 tablet 0  . multivitamin (RENA-VIT) TABS tablet Take 1 tablet by mouth at bedtime. (Patient taking differently: Take 1 tablet by mouth every evening. ) 30 tablet 0  . Pancrelipase, Lip-Prot-Amyl, (CREON) 6000 units CPEP Take 1 capsule by mouth 3 (three) times daily with meals.    . pantoprazole (PROTONIX) 40 MG tablet Take 1 tablet (40 mg total) by mouth 2 (two) times daily. 60 tablet 1  . Amino Acids-Protein Hydrolys (FEEDING SUPPLEMENT, PRO-STAT SUGAR FREE 64,) LIQD Take 30 mLs by mouth 3 (three) times daily with meals. (Patient not taking: Reported on 09/24/2018) 887 mL 0   Labs: Basic Metabolic Panel: Recent Labs  Lab 11/01/2018 1304  NA 137  K 4.4  CL 105  CO2 21*  GLUCOSE 178*  BUN 35*  CREATININE 5.53*  CALCIUM 7.2*   Liver Function Tests: Recent Labs  Lab 11/21/2018 1304  AST 22  ALT 14  ALKPHOS 65  BILITOT 0.2*  PROT 5.0*  ALBUMIN 1.6*   No results for input(s): LIPASE, AMYLASE in the last 168 hours. No results for input(s): AMMONIA in the last 168 hours. CBC: Recent Labs  Lab 11/17/2018 1304  WBC 8.5  NEUTROABS 6.6  HGB 11.8*  HCT 38.7  MCV 93.0  PLT 155   Cardiac Enzymes: No results for input(s): CKTOTAL, CKMB, CKMBINDEX, TROPONINI in the last 168 hours. CBG: No results for input(s): GLUCAP in the last 168 hours. Iron Studies: No results for input(s): IRON, TIBC, TRANSFERRIN, FERRITIN in the last 72 hours. Studies/Results: Dg Chest Port 1 View  Result Date: 11/09/2018 CLINICAL DATA:  Hypotension and weakness. EXAM: PORTABLE CHEST 1 VIEW COMPARISON:  Chest x-ray dated 09/24/2018 FINDINGS: There is increased pulmonary vascular congestion with increased small to moderate right pleural effusion. Left effusion is essentially unchanged with some of the fluid shifted to the left apex. Overall heart size is normal.  Aortic atherosclerosis. No significant bone abnormality. IMPRESSION: 1. Increasing pulmonary vascular congestion with increased small to moderate right pleural effusion. 2. No change in the left effusion. 3. Aortic atherosclerosis. Electronically Signed  By: Lorriane Shire M.D.   On: 10/29/2018 14:11   Dg Foot 2 Views Right  Result Date: 11/14/2018 CLINICAL DATA:  And foot ulceration EXAM: RIGHT FOOT - 2 VIEW COMPARISON:  None. FINDINGS: There is diffuse osteopenia which limits evaluation of the osseous structures. There is question lucency seen at the base of the second proximal phalanx at the MTP joint. There is sclerosis seen within the distal tuft of the first digit. No definite area of cortical destruction seen. There is dorsal soft tissue swelling and dense vascular calcifications. IMPRESSION: Diffuse osteopenia limits evaluation of the osseous structures. Question of a nondisplaced fracture seen at the base of the second proximal phalanx at the MTP joint. Sclerosis of the distal tuft of the first digit could be from chronic osteomyelitis or prior injury. Electronically Signed   By: Prudencio Pair M.D.   On:  16:19    ROS: Review of systems not obtained due to patient factors. Physical Exam: Vitals:   11/03/2018 1445 11/07/2018 1500 11/13/2018 1515 10/30/2018 1530  BP: (!) 89/50 (!) 101/51 (!) 104/51 (!) 93/52  Pulse: 61 62  65  Resp: 15 14 14 15   Temp:      TempSrc:      SpO2: 100% 100%  99%  Weight:      Height:          Weight change:   Intake/Output Summary (Last 24 hours) at 11/12/2018 1648 Last data filed at 11/21/2018 1455 Gross per 24 hour  Intake 1470 ml  Output -  Net 1470 ml   BP (!) 93/52   Pulse 65   Temp (!) 94.8 F (34.9 C) (Rectal)   Resp 15   Ht 4\' 11"  (1.499 m)   Wt 35.9 kg   SpO2 99%   BMI 15.99 kg/m  General appearance: cachectic, delirious, pale and slowed mentation Head: atraumatic, bitemporal wasting Resp: diminished breath sounds  bibasilar Cardio: no rub GI: soft, non-tender; bowel sounds normal; no masses,  no organomegaly Extremities: edema 1+ pitting edema of bilateral lower extremities and decubitus ulcer of right heel, no fluctuance or drainage, RUE AVG +T/B Dialysis Access:  Dialysis Orders:  TTS [facility]  3h (usual runs 2h)   32.5kg   2/2.5 bath  R AVG  Assessment/Plan: 1.  Hypotension/Sepsis- pt with acute on chronic hypotension and has been unable to tolerate outpatient HD this week.  Agree with admission to ICU and IV abx as well as transfer to [facility] ICU as she is too unstable for intermittent HD.  Her son wants to be aggressive and would like to be transferred to [facility] for attempt at CVVHD.  Now with diarrhea, need to rule out C. Diff.  2.  ESRD -  As above.  She has not been tolerating outpatient HD due to hypotension which has worsened and she has not had any HD this week.  Poor overall prognosis and recommend palliative care consult to help set goals/limits of care as she is a full code at present 3.  Hypertension/volume  -  Hypotensive with evidence of volume overload.  Only chance for UF is with CVVHD +/- pressors and transfer to ICU at [facility] 4.  Anemia  - stable 5.  Metabolic bone disease -  Check phos levels 6.  Severe Protein-caloric Malnutrition - alb 1.6, poor po intake.  Will need supplements and Palliative care consult as above  Donetta Potts, MD [place] Pager [phone] , 4:48 PM

## 2018-11-11 ENCOUNTER — Inpatient Hospital Stay (HOSPITAL_COMMUNITY): Payer: Medicare Other

## 2018-11-11 DIAGNOSIS — I959 Hypotension, unspecified: Secondary | ICD-10-CM

## 2018-11-11 DIAGNOSIS — G9340 Encephalopathy, unspecified: Secondary | ICD-10-CM

## 2018-11-11 LAB — BASIC METABOLIC PANEL
Anion gap: 10 (ref 5–15)
BUN: 35 mg/dL — ABNORMAL HIGH (ref 8–23)
CO2: 21 mmol/L — ABNORMAL LOW (ref 22–32)
Calcium: 7.2 mg/dL — ABNORMAL LOW (ref 8.9–10.3)
Chloride: 107 mmol/L (ref 98–111)
Creatinine, Ser: 5.35 mg/dL — ABNORMAL HIGH (ref 0.44–1.00)
GFR calc Af Amer: 8 mL/min — ABNORMAL LOW (ref >60)
GFR calc non Af Amer: 7 mL/min — ABNORMAL LOW (ref >60)
Glucose, Bld: 101 mg/dL — ABNORMAL HIGH (ref 70–99)
Potassium: 3.9 mmol/L (ref 3.5–5.1)
Sodium: 138 mmol/L (ref 135–145)

## 2018-11-11 LAB — BLOOD CULTURE ID PANEL (REFLEXED)

## 2018-11-11 LAB — TYPE AND SCREEN
ABO/RH(D): A POS
Antibody Screen: NEGATIVE

## 2018-11-11 LAB — MRSA PCR SCREENING: MRSA by PCR: NEGATIVE

## 2018-11-11 LAB — GLUCOSE, CAPILLARY
Glucose-Capillary: 105 mg/dL — ABNORMAL HIGH (ref 70–99)
Glucose-Capillary: 137 mg/dL — ABNORMAL HIGH (ref 70–99)
Glucose-Capillary: 142 mg/dL — ABNORMAL HIGH (ref 70–99)
Glucose-Capillary: 35 mg/dL — CL (ref 70–99)
Glucose-Capillary: 42 mg/dL — CL (ref 70–99)
Glucose-Capillary: 49 mg/dL — ABNORMAL LOW (ref 70–99)
Glucose-Capillary: 55 mg/dL — ABNORMAL LOW (ref 70–99)
Glucose-Capillary: 66 mg/dL — ABNORMAL LOW (ref 70–99)
Glucose-Capillary: 71 mg/dL (ref 70–99)
Glucose-Capillary: 81 mg/dL (ref 70–99)
Glucose-Capillary: 84 mg/dL (ref 70–99)

## 2018-11-11 LAB — CBC
HCT: 27.1 % — ABNORMAL LOW (ref 36.0–46.0)
Hemoglobin: 8.5 g/dL — ABNORMAL LOW (ref 12.0–15.0)
MCH: 28.5 pg (ref 26.0–34.0)
MCHC: 31.4 g/dL (ref 30.0–36.0)
MCV: 90.9 fL (ref 80.0–100.0)
Platelets: 122 10*3/uL — ABNORMAL LOW (ref 150–400)
RBC: 2.98 MIL/uL — ABNORMAL LOW (ref 3.87–5.11)
RDW: 22.3 % — ABNORMAL HIGH (ref 11.5–15.5)
WBC: 9.8 10*3/uL (ref 4.0–10.5)
nRBC: 0 % (ref 0.0–0.2)

## 2018-11-11 LAB — PHOSPHORUS: Phosphorus: 3.1 mg/dL (ref 2.5–4.6)

## 2018-11-11 LAB — TSH: TSH: 4.498 u[IU]/mL (ref 0.350–4.500)

## 2018-11-11 LAB — LACTIC ACID, PLASMA: Lactic Acid, Venous: 1.4 mmol/L (ref 0.5–1.9)

## 2018-11-11 LAB — C DIFFICILE QUICK SCREEN W PCR REFLEX
C Diff antigen: NEGATIVE
C Diff interpretation: NOT DETECTED
C Diff toxin: NEGATIVE

## 2018-11-11 LAB — MAGNESIUM: Magnesium: 1.8 mg/dL (ref 1.7–2.4)

## 2018-11-11 MED ORDER — DEXTROSE 50 % IV SOLN
INTRAVENOUS | Status: AC
  Filled 2018-11-11: qty 50

## 2018-11-11 MED ORDER — LIDOCAINE HCL (PF) 1 % IJ SOLN
5.0000 mL | INTRAMUSCULAR | Status: DC | PRN
  Filled 2018-11-11: qty 5

## 2018-11-11 MED ORDER — PENTAFLUOROPROP-TETRAFLUOROETH EX AERO: 1.0000 "application " | INHALATION_SPRAY | CUTANEOUS | Status: DC | PRN

## 2018-11-11 MED ORDER — LIDOCAINE-PRILOCAINE 2.5-2.5 % EX CREA
1.0000 "application " | TOPICAL_CREAM | CUTANEOUS | Status: DC | PRN
  Filled 2018-11-11: qty 5

## 2018-11-11 MED ORDER — SODIUM CHLORIDE 0.9 % IV SOLN: 100.0000 mL | INTRAVENOUS | Status: DC | PRN

## 2018-11-11 MED ORDER — PIPERACILLIN-TAZOBACTAM IN DEX 2-0.25 GM/50ML IV SOLN: 2.2500 g | Freq: Three times a day (TID) | INTRAVENOUS | Status: DC

## 2018-11-11 MED ORDER — FLUDROCORTISONE ACETATE 0.1 MG PO TABS
0.1000 mg | ORAL_TABLET | Freq: Every day | ORAL | Status: DC
  Administered 2018-11-11 – 2018-11-15 (×5): 0.1 mg via ORAL
  Filled 2018-11-11 (×9): qty 1

## 2018-11-11 MED ORDER — DEXTROSE 50 % IV SOLN
INTRAVENOUS | Status: AC
  Administered 2018-11-11: 20:00:00 50 mL
  Filled 2018-11-11: qty 50

## 2018-11-11 MED ORDER — SODIUM CHLORIDE 0.9% FLUSH
10.0000 mL | Freq: Two times a day (BID) | INTRAVENOUS | Status: DC
  Administered 2018-11-12 – 2018-11-16 (×9): 10 mL

## 2018-11-11 MED ORDER — DEXTROSE 50 % IV SOLN
25.0000 g | INTRAVENOUS | Status: AC
  Administered 2018-11-11: 16:00:00 25 g via INTRAVENOUS

## 2018-11-11 MED ORDER — SODIUM CHLORIDE 0.9 % IV SOLN
2.0000 g | INTRAVENOUS | Status: DC
  Administered 2018-11-11 – 2018-11-18 (×8): 2 g via INTRAVENOUS
  Filled 2018-11-11: qty 20
  Filled 2018-11-11 (×2): qty 2
  Filled 2018-11-11: qty 20
  Filled 2018-11-11 (×4): qty 2
  Filled 2018-11-11: qty 20

## 2018-11-11 MED ORDER — SODIUM CHLORIDE 0.9% FLUSH: 10.0000 mL | INTRAVENOUS | Status: DC | PRN

## 2018-11-11 MED ORDER — HEPARIN SODIUM (PORCINE) 1000 UNIT/ML DIALYSIS
1000.0000 [IU] | INTRAMUSCULAR | Status: DC | PRN
  Filled 2018-11-11: qty 1

## 2018-11-11 MED ORDER — CHLORHEXIDINE GLUCONATE CLOTH 2 % EX PADS
6.0000 | MEDICATED_PAD | Freq: Every day | CUTANEOUS | Status: DC
  Administered 2018-11-11 – 2018-11-15 (×4): 6 via TOPICAL

## 2018-11-11 MED ORDER — DEXTROSE 50 % IV SOLN
INTRAVENOUS | Status: AC
  Administered 2018-11-11: 20:00:00 50 mL via INTRAVENOUS
  Filled 2018-11-11: qty 50

## 2018-11-11 MED ORDER — DEXTROSE 50 % IV SOLN
25.0000 g | INTRAVENOUS | Status: AC
  Administered 2018-11-11: 25 g via INTRAVENOUS
  Administered 2018-11-11: 20:00:00 50 mL via INTRAVENOUS

## 2018-11-11 NOTE — Progress Notes (Signed)
NAME:  Courtney [name], MRN:  [id number], DOB:  1940-08-02, LOS: 1 ADMISSION DATE:  10/25/2018, CONSULTATION DATE:  10/29/2018   REFERRING MD:  Dr. Manuella Ghazi, CHIEF COMPLAINT:  Hypotension    History of present illness   78 year old female presents to [facility] on 9/17 with increased weakness and fatigue. Reports missing last 2 HD appointments. However, attempted to go today but was unable to complete due to hypotension. On arrival to ED patient reports right foot chronic ulcer tenderness. CXR with pulmonary vascular congestion and bilateral pleural effusions. LA 3. BP 65/49. Temp 94.8. WBC 8.5. Given 2.5 L Fluid and Zosyn. Nephrology consulted. Due to needs for CVVHD transferred to [facility].   Son reports that patient is wheelchair bound and confused at baseline. For the last week with diarrhea and decreased oral intake   Past Medical History  ESRD, CAD, DM, HLD, HTN, CVA with residual right sided weakness, Diastolic HF  Significant Hospital Events   9/17 > [facility] with sepsis/hypotension > Transferred to [facility] for CVVHD  Consults:  Nephrology PCCM   Procedures:  CVC Right IJ 9/17 at [facility] >>  Significant Diagnostic Tests:  CXR 9/17 > 1. Increasing pulmonary vascular congestion with increased small to moderate right pleural effusion. 2. No change in the left effusion. 3. Aortic atherosclerosis. DG Foot 9/17 > Diffuse osteopenia limits evaluation of the osseous structures.Question of a nondisplaced fracture seen at the base of the second proximal phalanx at the MTP joint. Sclerosis of the distal tuft of the first digit could be from chronic osteomyelitis or prior injury.  Micro Data:  Blood 9/17 >>   Antimicrobials:  Zosyn 9/17 >> 9/18 Vancomycin 9/17 >> 9/18 Ceftriaxone-9/18 Interim history/subjective:  No voiced complaints.  Speaking softly for me but intermittently screams out.  Attempts to get out of bed.  Objective   Blood pressure (!) 108/54, pulse 71, temperature 98 F (36.7 C),  temperature source Oral, resp. rate 14, height 4\' 11"  (1.499 m), weight 36 kg, SpO2 100 %.        Intake/Output Summary (Last 24 hours) at 11/11/2018 1352 Last data filed at 11/11/2018 0700 Gross per 24 hour  Intake 2011.43 ml  Output 0 ml  Net 2011.43 ml   Filed Weights   10/31/2018 1259 11/11/18 0500  Weight: 35.9 kg 36 kg    Examination: General: No distress, lying in bed, cachectic HENT: Dry MM  Lungs: Clear breath sounds, diminished to bases  Cardiovascular: RRR, no MRG Abdomen: Soft, non-distended  Extremities: contractures noted to right upper/lower extremities, right upper arm fistula, right heel ulcer    Neuro: opens eyes to stimulation, does not follow commands, pupils intact  GU: intact   Resolved Hospital Problem list     Assessment & Plan:   Encephalopathy in setting of uremia vs shock state CVA with residual right side weakness   Plan -Monitor  -Treatment for infectious process   Hypotension in setting of Septic vs Hypovolemic shock  Chronic Hypotension - On Midodrine  Diastolic Heart Failure  H/O CAD  Plan  -Cardiac Monitoring  -Maintain Systolic [id number] or MAP [id number], Continue Home Midodrine   Non Gap Metabolic Acidosis, Lactic Acidosis  ESRD on HD TTS -Has missed last 2 HD sessions, unable to complete today due to hypotension  Plan -Nephrology Consulted >> Plans for CVVD >> on arrival to [facility] BP [id number] systolic no acute indication for HD, communicated with nephrology with plans to see in AM for possible HD  -  Trend LA (3.0>2.5)  -Trend BMP -Replace electrolytes as needed   Septic Shock, Hypothermia, now growing Proteus in blood cultures Suspect urinary source Plan -Taper antibiotics to ceftriaxone -Abdominal imaging to assess for source -Urine culture and urinalysis.  Anemia of Chronic Kidney Disease  Plan  -Trend CBC  -Transfuse for hemoglobin <7  DM Plan -Trend Glucose -SSI   GERD Plan -PPI   Best practice:  Diet: NPO DVT  prophylaxis: Heparin SQ  GI prophylaxis: Protonix  Glucose control: SSI Mobility: Bedrest  Code Status: FC Family Communication: Son updated at bedside  Disposition:   Labs   CBC: Recent Labs  Lab 11/23/2018 1304 11/11/18 0354  WBC 8.5 9.8  NEUTROABS 6.6  --   HGB 11.8* 8.5*  HCT 38.7 27.1*  MCV 93.0 90.9  PLT 155 122*    Basic Metabolic Panel: Recent Labs  Lab 11/16/2018 1304 11/11/18 0354  NA 137 138  K 4.4 3.9  CL 105 107  CO2 21* 21*  GLUCOSE 178* 101*  BUN 35* 35*  CREATININE 5.53* 5.35*  CALCIUM 7.2* 7.2*  MG  --  1.8  PHOS  --  3.1   GFR: Estimated Creatinine Clearance: 5 mL/min (A) (by C-G formula based on SCr of 5.35 mg/dL (H)). Recent Labs  Lab 11/06/2018 1304 10/29/2018 1514 10/31/2018 1522 11/11/18 0354  PROCALCITON  --   --  0.20  --   WBC 8.5  --   --  9.8  LATICACIDVEN 3.0* 2.5*  --  1.4    Liver Function Tests: Recent Labs  Lab 11/14/2018 1304  AST 22  ALT 14  ALKPHOS 65  BILITOT 0.2*  PROT 5.0*  ALBUMIN 1.6*   No results for input(s): LIPASE, AMYLASE in the last 168 hours. No results for input(s): AMMONIA in the last 168 hours.  ABG    Component Value Date/Time   PHART 7.434 09/01/2018 1941   PCO2ART 42.4 09/01/2018 1941   PO2ART 72.0 (L) 09/01/2018 1941   HCO3 28.4 (H) 09/01/2018 1941   TCO2 30 09/01/2018 1941   ACIDBASEDEF 3.8 (H) 08/04/2018 1345   O2SAT 95.0 09/01/2018 1941     Coagulation Profile: Recent Labs  Lab 10/29/2018 1304  INR 1.1    Cardiac Enzymes: No results for input(s): CKTOTAL, CKMB, CKMBINDEX, TROPONINI in the last 168 hours.  HbA1C: Hgb A1c MFr Bld  Date/Time Value Ref Range Status  07/14/2017 04:32 AM 4.3 (L) 4.8 - 5.6 % Final    Comment:    (NOTE) Pre diabetes:          5.7%-6.4% Diabetes:              >6.4% Glycemic control for   <7.0% adults with diabetes   07/17/2012 03:50 PM 5.9 (H) <5.7 % Final    Comment:    (NOTE)                                                                        According to the ADA Clinical Practice Recommendations for 2011, when HbA1c is used as a screening test:  >=6.5%   Diagnostic of Diabetes Mellitus           (if abnormal result is confirmed) 5.7-6.4%   Increased risk of  developing Diabetes Mellitus References:Diagnosis and Classification of Diabetes Mellitus,Diabetes [id number] 1):S62-S69 and Standards of Medical Care in         Diabetes - 2011,Diabetes [id number] (Suppl 1):S11-S61.    CBG: Recent Labs  Lab 11/11/18 0452 11/11/18 0517 11/11/18 0807 11/11/18 0924 11/11/18 1200  GLUCAP 66* 71 55* 81 137*    Review of Systems:   Unable to review as patient is confused   Past Medical History  She,  has a past medical history of Anemia (07/2018), CHF (congestive heart failure) ([place]), Coronary artery disease, Diabetes mellitus without complication ([place]), ESRD on dialysis ([place]) (02/10/2017), Hyperlipidemia, Hypertension, Myocardial infarct ([place]), and Stroke ([place]).   Surgical History    Past Surgical History:  Procedure Laterality Date  . APPENDECTOMY    . AV FISTULA PLACEMENT Right 09/20/2015   Procedure: PLACEMENT OF RIGHT UPPER EXTREMITY ARTERIOVENOUS GORE-TEX GRAFT FOR HEMODIALYSIS ACCESS;  Surgeon: Vickie Epley, MD;  Location: [facility];  Service: Vascular;  Laterality: Right;  . CATARACT EXTRACTION W/PHACO Left 06/29/2013   Procedure: CATARACT EXTRACTION PHACO AND INTRAOCULAR LENS PLACEMENT (IOC);  Surgeon: Tonny Branch, MD;  Location: [facility];  Service: Ophthalmology;  Laterality: Left;  CDE 12.25  . CATARACT EXTRACTION W/PHACO Right 07/27/2013   Procedure: CATARACT EXTRACTION PHACO AND INTRAOCULAR LENS PLACEMENT (IOC);  Surgeon: Tonny Branch, MD;  Location: [facility];  Service: Ophthalmology;  Laterality: Right;  CDE:7.72  . COLONOSCOPY WITH PROPOFOL N/A 08/07/2018   Procedure: COLONOSCOPY WITH PROPOFOL;  Surgeon: Ronnette Juniper, MD;  Location: [place];  Service: Gastroenterology;  Laterality: N/A;  .  ESOPHAGOGASTRODUODENOSCOPY N/A 08/03/2018   Procedure: ESOPHAGOGASTRODUODENOSCOPY (EGD);  Surgeon: Clarene Essex, MD;  Location: [place];  Service: Endoscopy;  Laterality: N/A;  . ESOPHAGOGASTRODUODENOSCOPY (EGD) WITH PROPOFOL N/A 08/07/2018   Procedure: ESOPHAGOGASTRODUODENOSCOPY (EGD) WITH PROPOFOL;  Surgeon: Ronnette Juniper, MD;  Location: [place];  Service: Gastroenterology;  Laterality: N/A;  . GIVENS CAPSULE STUDY N/A 08/07/2018   Procedure: GIVENS CAPSULE STUDY;  Surgeon: Ronnette Juniper, MD;  Location: [place];  Service: Gastroenterology;  Laterality: N/A;  . HEMOSTASIS CLIP PLACEMENT  08/07/2018   Procedure: HEMOSTASIS CLIP PLACEMENT;  Surgeon: Ronnette Juniper, MD;  Location: [place];  Service: Gastroenterology;;  . HOT HEMOSTASIS N/A 08/03/2018   Procedure: HOT HEMOSTASIS (ARGON PLASMA COAGULATION/BICAP);  Surgeon: Clarene Essex, MD;  Location: [place];  Service: Endoscopy;  Laterality: N/A;  . INSERTION OF DIALYSIS CATHETER Right 09/11/2015   Procedure: INSERTION OF TUNNELED DIALYSIS CATHETER;  Surgeon: Vickie Epley, MD;  Location: [facility];  Service: Vascular;  Laterality: Right;  . INTRAMEDULLARY (IM) NAIL INTERTROCHANTERIC Right 07/20/2012   Procedure: INTRAMEDULLARY (IM) NAIL INTERTROCHANTRIC;  Surgeon: Carole Civil, MD;  Location: [facility];  Service: Orthopedics;  Laterality: Right;  . IR AV DIALY SHUNT INTRO NEEDLE/INTRAC INITIAL W/PTA/STENT/IMG RT Right 07/20/2018  . IR GENERIC HISTORICAL  09/25/2015   IR US GUIDE VASC ACCESS RIGHT 09/25/2015 Sandi Mariscal, MD [facility]  . IR GENERIC HISTORICAL  09/25/2015   IR FLUORO GUIDE CV LINE RIGHT 09/25/2015 Sandi Mariscal, MD [facility]  . IR GENERIC HISTORICAL  09/25/2015   IR REMOVAL TUN CV CATH W/O FL 09/25/2015 Sandi Mariscal, MD [facility]  . IR THROMBECTOMY AV FISTULA W/THROMBOLYSIS/PTA INC/SHUNT/IMG RIGHT Right 09/06/2017  . IR THROMBECTOMY AV FISTULA W/THROMBOLYSIS/PTA INC/SHUNT/IMG RIGHT Right 09/29/2017  . IR THROMBECTOMY AV FISTULA  W/THROMBOLYSIS/PTA INC/SHUNT/IMG RIGHT Right 07/20/2018  . IR US GUIDE VASC ACCESS RIGHT  09/06/2017  . IR US GUIDE VASC ACCESS RIGHT  09/29/2017  .  IR US GUIDE VASC ACCESS RIGHT  07/20/2018  . IR US GUIDE VASC ACCESS RIGHT  07/20/2018  . SCLEROTHERAPY  08/03/2018   Procedure: Clide Deutscher;  Surgeon: Clarene Essex, MD;  Location: [facility] ENDOSCOPY;  Service: Endoscopy;;     Social History   reports that she has never smoked. She has never used smokeless tobacco. She reports that she does not drink alcohol or use drugs.   Family History   Her family history includes Heart disease in her father and mother.   Allergies Allergies  Allergen Reactions  . Grapefruit Flavor [Flavoring Agent] Other (See Comments)    "Combination of this and another med caused a STROKE"     Home Medications  Prior to Admission medications   Medication Sig Start Date End Date Taking? Authorizing Provider  aspirin EC 81 MG tablet Take 81 mg by mouth daily.   Yes [provider]  atorvastatin (LIPITOR) 20 MG tablet Take 20 mg by mouth daily.   Yes [provider]  escitalopram (LEXAPRO) 5 MG tablet Take 5 mg by mouth daily.    Yes [provider]  loperamide (IMODIUM A-D) 2 MG tablet Take 2 mg by mouth every 6 (six) hours as needed for diarrhea or loose stools.   Yes [provider]  midodrine (PROAMATINE) 10 MG tablet Take 1 tablet (10 mg total) by mouth 3 (three) times daily with meals. 08/19/18  Yes Mikhail, [place], DO  multivitamin (RENA-VIT) TABS tablet Take 1 tablet by mouth at bedtime. Patient taking differently: Take 1 tablet by mouth every evening.  08/19/18  Yes Mikhail, Maryann, DO  Pancrelipase, Lip-Prot-Amyl, (CREON) 6000 units CPEP Take 1 capsule by mouth 3 (three) times daily with meals. 09/27/17  Yes [provider]  pantoprazole (PROTONIX) 40 MG tablet Take 1 tablet (40 mg total) by mouth 2 (two) times daily. 08/19/18  Yes Mikhail, Clinical biochemist, DO  Amino Acids-Protein  Hydrolys (FEEDING SUPPLEMENT, PRO-STAT SUGAR FREE 64,) LIQD Take 30 mLs by mouth 3 (three) times daily with meals. Patient not taking: Reported on 09/24/2018 08/19/18   Cristal Ford, [place], [place] ICU Physician [place]  Pager: [phone] Mobile: [phone] After hours: [phone].  11/11/2018, 1:55 PM

## 2018-11-11 NOTE — Progress Notes (Signed)
Hypoglycemic Event  CBG: 66  Treatment: 40z of Sprite   Symptoms:None exhibited by patient  Follow-up CBG: Time 0515 CBG Result:71  Possible Reasons for Event: ESRD with missed dialysis sessions.  Comments/MD notified: Protocol followed per MD order    Katha Hamming

## 2018-11-11 NOTE — Progress Notes (Signed)
Blood cultures came back Gram negative rods x2. D/W EMD Dr Deterding. Not due to receive vancomycin until 9/19. Will await sensitivity results before adjusting antibiotics.

## 2018-11-11 NOTE — Plan of Care (Signed)

## 2018-11-11 NOTE — Consult Note (Signed)
[place] Nurse wound consult note Patient receiving care in [place].  Assisted by her primary RN, Lavella Lemons. Reason for Consult: right heel wound Wound type: unstageable Pressure Injury POA: Yes Measurement: 4 cm x 4 cm Wound bed: 100 % eschar Drainage (amount, consistency, odor) none Periwound: intact Dressing procedure/placement/frequency:  Apply iodine (from the swabs in clean utility) to the wound on the Right lateral heel.  Cover with a heel foam dressing.  The dressing can remain in place up to 3 days. Peel the dressing back and apply the iodine, allow to dry, then place dressing back in place.  Also evaluated the left heel, no wound at present, and the coccyx.  The coccyx is hypopigmented and clearly had a wound at one time, but not at present.    Additional care orders:  Turn every 2 hours right or left, do not place on coccyx. Use ONLY ONE DermaTherapy pad beneath patient's buttocks. Do NOT use disposable pads. And, place a pillow between the right calf and her thigh/buttocks because she continually contracts the leg. Monitor the wound area(s) for worsening of condition such as: Signs/symptoms of infection,  Increase in size,  Development of or worsening of odor, Development of pain, or increased pain at the affected locations.  Notify the medical team if any of these develop.  Thank you for the consult.  Discussed plan of care with the patient and bedside nurse.  [place] nurse will not follow at this time.  Please re-consult the [place] team if needed.  Val Riles, RN, MSN, CWOCN, CNS-BC, pager [phone]

## 2018-11-11 NOTE — Progress Notes (Signed)
PHARMACY - PHYSICIAN COMMUNICATION CRITICAL VALUE ALERT - BLOOD CULTURE IDENTIFICATION (BCID)  Courtney [name] is an 78 y.o. female who presented to [facility] on 11/04/2018 with a chief complaint of weakness and fatigue to [facility], was transferred here for CVVHD needs.  Assessment:  Proteus Species 2/4 bottles, source likely her chronic right heel ulcer   Name of physician (or Provider) Contacted: Dr. Lynetta Mare  Current antibiotics: Vancomycin and zosyn  Changes to prescribed antibiotics recommended:  Recommendations accepted by provider  Change to ceftriaxone   Results for orders placed or performed during the hospital encounter of 11/17/2018  Blood Culture ID Panel (Reflexed) (Collected: 10/27/2018  1:04 PM)  Result Value Ref Range   Enterococcus species NOT DETECTED NOT DETECTED   Listeria monocytogenes NOT DETECTED NOT DETECTED   Staphylococcus species NOT DETECTED NOT DETECTED   Staphylococcus aureus (BCID) NOT DETECTED NOT DETECTED   Streptococcus species NOT DETECTED NOT DETECTED   Streptococcus agalactiae NOT DETECTED NOT DETECTED   Streptococcus pneumoniae NOT DETECTED NOT DETECTED   Streptococcus pyogenes NOT DETECTED NOT DETECTED   Acinetobacter baumannii NOT DETECTED NOT DETECTED   Enterobacteriaceae species DETECTED (A) NOT DETECTED   Enterobacter cloacae complex NOT DETECTED NOT DETECTED   Escherichia coli NOT DETECTED NOT DETECTED   Klebsiella oxytoca NOT DETECTED NOT DETECTED   Klebsiella pneumoniae NOT DETECTED NOT DETECTED   Proteus species DETECTED (A) NOT DETECTED   Serratia marcescens NOT DETECTED NOT DETECTED   Carbapenem resistance NOT DETECTED NOT DETECTED   Haemophilus influenzae NOT DETECTED NOT DETECTED   Neisseria meningitidis NOT DETECTED NOT DETECTED   Pseudomonas aeruginosa NOT DETECTED NOT DETECTED   Candida albicans NOT DETECTED NOT DETECTED   Candida glabrata NOT DETECTED NOT DETECTED   Candida krusei NOT DETECTED NOT DETECTED   Candida parapsilosis NOT  DETECTED NOT DETECTED   Candida tropicalis NOT DETECTED NOT DETECTED    [name] 11/11/2018  10:10 AM

## 2018-11-11 NOTE — Progress Notes (Addendum)
  [facility] Progress Note   Assessment/ Plan:   Dialysis Orders: TTS [facility] 3h (usual runs 2h) 32.5kg 2/2.5 bath R AVG  Assessment/Plan: 1.  Hypotension/Septic Shock- pt with acute on chronic hypotension and has been unable to tolerate outpatient HD this week. GNR bacteremia noted, Proteus.  On Broad-spectrum antibiotics.  Her pressures have increased with appropriate rx.  Overall, it looks like she has had functional decline and FTT picture.  She needs a palliative care consult to help define limits of care.  I'll talk with her family- I'm not sure that CVVHD would change her overall outcome and would likely contribute to further functional decline.  I'll d/w family and then I'll see if she can tolerate intermittent HD tomorrow after rx of septic shock.  If cannot tolerate, hospice would be appropriate at any time.  ON florinef and midodrine.  2.  ESRD -  As above.  She has not been tolerating outpatient HD due to hypotension which has worsened and she has not had any HD this week.  Poor overall prognosis and recommend palliative care consult to help set goals/limits of care as she is a full code at present  3.  Hypertension/volume  -  Off pressors, Bps improved, will see if can attempt IHD tomorrow  4.  Anemia  - stable  5.  Metabolic bone disease -  Check phos levels  6.  Severe Protein-caloric Malnutrition - alb 1.6, poor po intake.  Will need supplements and Palliative care consult as above  Subjective:    Proteus GNR bacteremia noted.  Moaning and yelling this AM.  Pressures improved, off pressors   Objective:   BP (!) 110/53   Pulse 68   Temp (!) 97.5 F (36.4 C) (Oral)   Resp 13   Ht 4\' 11"  (1.499 m)   Wt 36 kg   SpO2 100%   BMI 16.03 kg/m   Physical Exam: [id number], sitting in bed, moaning CVS: RRR Resp: clear anteriorly Abd: thin Ext: severely sarcopenic, 2+ upper and lower LE edema.  Labs: BMET Recent Labs  Lab 11/06/2018 1304  11/11/18 0354  NA 137 138  K 4.4 3.9  CL 105 107  CO2 21* 21*  GLUCOSE 178* 101*  BUN 35* 35*  CREATININE 5.53* 5.35*  CALCIUM 7.2* 7.2*  PHOS  --  3.1   CBC Recent Labs  Lab 10/30/2018 1304 11/11/18 0354  WBC 8.5 9.8  NEUTROABS 6.6  --   HGB 11.8* 8.5*  HCT 38.7 27.1*  MCV 93.0 90.9  PLT 155 122*    @IMGRELPRIORS @ Medications:    . aspirin EC  81 mg Oral Daily  . atorvastatin  20 mg Oral Daily  . Chlorhexidine Gluconate Cloth  6 each Topical Daily  . escitalopram  5 mg Oral Daily  . feeding supplement (PRO-STAT SUGAR FREE 64)  30 mL Oral TID WC  . fludrocortisone  0.1 mg Oral Daily  . heparin  5,000 Units Subcutaneous Q8H  . insulin aspart  2-6 Units Subcutaneous Q4H  . lipase/protease/amylase  12,000 Units Oral TID AC  . midodrine  10 mg Oral TID WC  . multivitamin  1 tablet Oral QHS  . pantoprazole  40 mg Oral BID  . sodium chloride flush  10-40 mL Intracatheter Q12H     Madelon Lips MD [facility] pgr [phone] 11/11/2018, 10:47 AM

## 2018-11-11 NOTE — Progress Notes (Signed)
CRITICAL VALUE ALERT  Critical Value:35  Date & Time Notied:  11/11/2018 1614  Provider Notified:n/a Orders Received/Actions taken: Hypoglycemia Protocol

## 2018-11-11 NOTE — Progress Notes (Signed)
CRITICAL VALUE ALERT  Critical Value: Blood Cultures (Anerobic and aerobic bottle present with Gram negative rods   Date & Time Notied:  11/11/18 @ 0528  Provider Notified: CCM nurse Warren Lacy)   Orders Received/Actions taken: NO NEW ORDERS. See notes for [facility] nurse notes.

## 2018-11-12 DIAGNOSIS — I953 Hypotension of hemodialysis: Secondary | ICD-10-CM

## 2018-11-12 DIAGNOSIS — R531 Weakness: Secondary | ICD-10-CM

## 2018-11-12 DIAGNOSIS — N179 Acute kidney failure, unspecified: Secondary | ICD-10-CM

## 2018-11-12 DIAGNOSIS — N186 End stage renal disease: Secondary | ICD-10-CM

## 2018-11-12 DIAGNOSIS — A419 Sepsis, unspecified organism: Secondary | ICD-10-CM

## 2018-11-12 DIAGNOSIS — Z992 Dependence on renal dialysis: Secondary | ICD-10-CM

## 2018-11-12 DIAGNOSIS — R6521 Severe sepsis with septic shock: Secondary | ICD-10-CM

## 2018-11-12 DIAGNOSIS — Z515 Encounter for palliative care: Secondary | ICD-10-CM

## 2018-11-12 LAB — RENAL FUNCTION PANEL
Albumin: 1.3 g/dL — ABNORMAL LOW (ref 3.5–5.0)
Anion gap: 12 (ref 5–15)
BUN: 40 mg/dL — ABNORMAL HIGH (ref 8–23)
CO2: 18 mmol/L — ABNORMAL LOW (ref 22–32)
Calcium: 7.3 mg/dL — ABNORMAL LOW (ref 8.9–10.3)
Chloride: 106 mmol/L (ref 98–111)
Creatinine, Ser: 5.79 mg/dL — ABNORMAL HIGH (ref 0.44–1.00)
GFR calc Af Amer: 8 mL/min — ABNORMAL LOW (ref >60)
GFR calc non Af Amer: 6 mL/min — ABNORMAL LOW (ref >60)
Glucose, Bld: 120 mg/dL — ABNORMAL HIGH (ref 70–99)
Phosphorus: 2.7 mg/dL (ref 2.5–4.6)
Potassium: 3.6 mmol/L (ref 3.5–5.1)
Sodium: 136 mmol/L (ref 135–145)

## 2018-11-12 LAB — GLUCOSE, CAPILLARY
Glucose-Capillary: 124 mg/dL — ABNORMAL HIGH (ref 70–99)
Glucose-Capillary: 131 mg/dL — ABNORMAL HIGH (ref 70–99)
Glucose-Capillary: 150 mg/dL — ABNORMAL HIGH (ref 70–99)
Glucose-Capillary: 23 mg/dL — CL (ref 70–99)
Glucose-Capillary: 246 mg/dL — ABNORMAL HIGH (ref 70–99)
Glucose-Capillary: 48 mg/dL — ABNORMAL LOW (ref 70–99)
Glucose-Capillary: 58 mg/dL — ABNORMAL LOW (ref 70–99)
Glucose-Capillary: 64 mg/dL — ABNORMAL LOW (ref 70–99)
Glucose-Capillary: 66 mg/dL — ABNORMAL LOW (ref 70–99)

## 2018-11-12 LAB — CBC
HCT: 27.8 % — ABNORMAL LOW (ref 36.0–46.0)
Hemoglobin: 9.1 g/dL — ABNORMAL LOW (ref 12.0–15.0)
MCH: 28.9 pg (ref 26.0–34.0)
MCHC: 32.7 g/dL (ref 30.0–36.0)
MCV: 88.3 fL (ref 80.0–100.0)
Platelets: 113 10*3/uL — ABNORMAL LOW (ref 150–400)
RBC: 3.15 MIL/uL — ABNORMAL LOW (ref 3.87–5.11)
RDW: 22.2 % — ABNORMAL HIGH (ref 11.5–15.5)
WBC: 9 10*3/uL (ref 4.0–10.5)
nRBC: 0 % (ref 0.0–0.2)

## 2018-11-12 LAB — URINALYSIS, ROUTINE W REFLEX MICROSCOPIC
Bilirubin Urine: NEGATIVE
Glucose, UA: NEGATIVE mg/dL
Ketones, ur: NEGATIVE mg/dL
Nitrite: NEGATIVE
Protein, ur: 100 mg/dL — AB
RBC / HPF: >50 RBC/hpf — ABNORMAL HIGH (ref 0–5)
Specific Gravity, Urine: 1.015 (ref 1.005–1.030)
WBC, UA: >50 WBC/hpf — ABNORMAL HIGH (ref 0–5)
pH: 7 (ref 5.0–8.0)

## 2018-11-12 LAB — HEMOGLOBIN A1C
Hgb A1c MFr Bld: 4.6 % — ABNORMAL LOW (ref 4.8–5.6)
Mean Plasma Glucose: 85 mg/dL

## 2018-11-12 MED ORDER — DEXTROSE 50 % IV SOLN
12.5000 g | INTRAVENOUS | Status: AC
  Administered 2018-11-12: 05:00:00 12.5 g via INTRAVENOUS

## 2018-11-12 MED ORDER — ALBUMIN HUMAN 25 % IV SOLN
25.0000 g | Freq: Once | INTRAVENOUS | Status: AC
  Administered 2018-11-12: 14:00:00 25 g via INTRAVENOUS

## 2018-11-12 MED ORDER — DEXTROSE 50 % IV SOLN
INTRAVENOUS | Status: AC
  Filled 2018-11-12: qty 50

## 2018-11-12 MED ORDER — DEXTROSE 50 % IV SOLN
INTRAVENOUS | Status: AC
  Administered 2018-11-12: 20:00:00 50 mL
  Filled 2018-11-12: qty 50

## 2018-11-12 MED ORDER — ALBUMIN HUMAN 25 % IV SOLN
INTRAVENOUS | Status: AC
  Administered 2018-11-12: 14:00:00 25 g via INTRAVENOUS
  Filled 2018-11-12: qty 100

## 2018-11-12 MED ORDER — DEXTROSE 5 % IV SOLN
INTRAVENOUS | Status: DC
  Administered 2018-11-12 – 2018-11-13 (×3): via INTRAVENOUS

## 2018-11-12 MED ORDER — DEXTROSE 50 % IV SOLN
12.5000 g | INTRAVENOUS | Status: AC
  Administered 2018-11-12: 11:00:00 12.5 g via INTRAVENOUS

## 2018-11-12 MED ORDER — DEXTROSE 50 % IV SOLN
25.0000 g | INTRAVENOUS | Status: AC
  Administered 2018-11-12: 01:00:00 25 g via INTRAVENOUS
  Filled 2018-11-12: qty 50

## 2018-11-12 MED ORDER — DEXTROSE 50 % IV SOLN
INTRAVENOUS | Status: AC
  Administered 2018-11-12: 11:00:00
  Filled 2018-11-12: qty 50

## 2018-11-12 NOTE — Consult Note (Signed)
Consultation Note Date: 11/12/2018   Patient Name: Courtney [name]  DOB: [birth year]  MRN: [id number]  Age / Sex: 78 y.o., female  PCP: Neale Burly, MD Referring Physician: Rush Farmer, MD  Reason for Consultation: Establishing goals of care  HPI/Patient Profile: 78 y.o. female  with past medical history of ESRD on hemodialysis, CAD, dCHF, DM, HLD, HTN, CVA with right sided weakness admitted on 11/01/2018 with weakness and fatigue from [facility]. Reports missing last 2 HD sessions. Attempted HD on the day of admission but unable to complete due to hypotension. In ED, CXR with pulmonary vascular congestion and bilateral pleural effusions. BP 65/49. Chronic right foot ulcer. Transferred to [facility] for CVVHD. Nephrology following. Blood pressures remain soft, home midodrine. Plan is to attempt intermittent HD today. Blood cultures positive for proteus, suspect urinary source. Receiving rocephin. Palliative medicine consultation for goals of care.   Clinical Assessment and Goals of Care:  I have reviewed medical records, discussed with care team including RN and Dr. Nelda Marseille and assessed the patient at bedside. She wakes to voice but drowsy and pleasantly confused. She denies pain, dyspnea, nausea, or other discomfort. She appears cachetic and debilitated. No family at bedside. Currently receiving hemodialysis. Discussed with HD RN at bedside--unfortunately blood pressure is soft and he is unable to pull fluid off this session.   Patient is known to PMT from previous admissions. Notes reviewed from 07/2018 and 08/2018.   Shortly after, spoke with daughter Courtney [name]) via telephone.   I introduced Palliative Medicine as specialized medical care for people living with serious illness. It focuses on providing relief from the symptoms and stress of a serious illness. The goal is to improve quality of life for both the  patient and the family.  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 She explains recurrent hospitalization in the last few months and her understanding that palliative discussion is necessary. "EOL/palliative care is where we are headed." She shares that she has the medical understanding of poor prognosis but her brother has not yet come to terms with this. She states "he understands her health has gone downhill" but is having difficulty "accepting" this.   Ms. Bedi had four children but two are deceased. Courtney [name] and her brother, Courtney [name], make medical decisions together. Kristn lives with son, Courtney [name] but Courtney [name] is actively involved in her care.   Explained importance for [place] discussion (in person if possible) with both her and her brother. Explained course of hospitalization and challenges with her tolerating dialysis including soft blood pressures and the inability to remove fluid. Courtney [name] acknowledges dialysis has become more challenging outpatient.   Courtney [name] agrees that it would be best for family meeting with palliative provider in person. She wants her brother to have the opportunity to listen and ask questions regarding steps moving forward. Courtney [name] is unavailable tomorrow, Sunday but available on Monday for [place] discussion. PMT contact information given to daughter and encouraged she call Sunday with Monday meeting time. Also explained  that I would have palliative provider call and arrange meeting for Monday.   Answered questions and concerns for daughter.    SUMMARY OF RECOMMENDATIONS    Continue FULL code/FULL scope treatment. Continue current plan of care. Pending family GOC discussion.  Daughter agrees face-to-face family meeting is necessary, understanding her mother's conditions and poor prognosis. Daughter and son are joint decision makers. Children unavailable to meet until Monday, 9/21. PMT provider to call family Monday AM for meeting time. PMT  contact information given to daughter.   Code Status/Advance Care Planning:  Full code  Symptom Management:   Per attending  Palliative Prophylaxis:   Aspiration, Delirium Protocol, Frequent Pain Assessment, Oral Care and Turn Reposition  Psycho-social/Spiritual:   Desire for further Chaplaincy support:yes  Additional Recommendations: Caregiving  Support/Resources, Compassionate Wean Education and Education on Hospice  Prognosis:   Poor prognosis  Discharge Planning: To Be Determined      Primary Diagnoses: Present on Admission: . Hypotension . Sepsis ([place])   I have reviewed the medical record, interviewed the patient and family, and examined the patient. The following aspects are pertinent.  Past Medical History:  Diagnosis Date  . Anemia 07/2018  . CHF (congestive heart failure) ([place])   . Coronary artery disease   . Diabetes mellitus without complication ([place])   . ESRD on dialysis ([place]) 02/10/2017  . Hyperlipidemia   . Hypertension   . Myocardial infarct ([place])   . Stroke [facility])    2-3 yrs ago- right sided weakness   Social History   Socioeconomic History  . Marital status: Single    Spouse name: Not on file  . Number of children: Not on file  . Years of education: Not on file  . Highest education level: Not on file  Occupational History  . Not on file  Social Needs  . Financial resource strain: Not on file  . Food insecurity    Worry: Not on file    Inability: Not on file  . Transportation needs    Medical: Not on file    Non-medical: Not on file  Tobacco Use  . Smoking status: Never Smoker  . Smokeless tobacco: Never Used  Substance and Sexual Activity  . Alcohol use: No  . Drug use: No  . Sexual activity: Not Currently    Birth control/protection: None  Lifestyle  . Physical activity    Days per week: Not on file    Minutes per session: Not on file  . Stress: Not on file  Relationships  . Social Herbalist on phone: Not on  file    Gets together: Not on file    Attends religious service: Not on file    Active member of club or organization: Not on file    Attends meetings of clubs or organizations: Not on file    Relationship status: Not on file  Other Topics Concern  . Not on file  Social History Narrative  . Not on file   Family History  Problem Relation Age of Onset  . Heart disease Mother   . Heart disease Father    Scheduled Meds: . aspirin EC  81 mg Oral Daily  . atorvastatin  20 mg Oral Daily  . Chlorhexidine Gluconate Cloth  6 each Topical Daily  . Chlorhexidine Gluconate Cloth  6 each Topical Q0600  . escitalopram  5 mg Oral Daily  . feeding supplement (PRO-STAT SUGAR FREE 64)  30 mL Oral TID WC  .  fludrocortisone  0.1 mg Oral Daily  . heparin  5,000 Units Subcutaneous Q8H  . insulin aspart  2-6 Units Subcutaneous Q4H  . lipase/protease/amylase  12,000 Units Oral TID AC  . midodrine  10 mg Oral TID WC  . multivitamin  1 tablet Oral QHS  . pantoprazole  40 mg Oral BID  . sodium chloride flush  10-40 mL Intracatheter Q12H   Continuous Infusions: . sodium chloride    . sodium chloride    . cefTRIAXone (ROCEPHIN)  IV Stopped (11/12/18 1150)  . dextrose 30 mL/hr at 11/12/18 1500   PRN Meds:.sodium chloride, sodium chloride, acetaminophen **OR** acetaminophen, alteplase, alteplase, heparin, lidocaine (PF), lidocaine-prilocaine, ondansetron **OR** ondansetron (ZOFRAN) IV, pentafluoroprop-tetrafluoroeth, sodium chloride flush, traMADol Medications Prior to Admission:  Prior to Admission medications   Medication Sig Start Date End Date Taking? Authorizing Provider  aspirin EC 81 MG tablet Take 81 mg by mouth daily.   Yes [provider]  atorvastatin (LIPITOR) 20 MG tablet Take 20 mg by mouth daily.   Yes [provider]  escitalopram (LEXAPRO) 5 MG tablet Take 5 mg by mouth daily.    Yes [provider]  loperamide (IMODIUM A-D) 2 MG tablet Take 2 mg by mouth  every 6 (six) hours as needed for diarrhea or loose stools.   Yes [provider]  midodrine (PROAMATINE) 10 MG tablet Take 1 tablet (10 mg total) by mouth 3 (three) times daily with meals. 08/19/18  Yes Mikhail, [place], DO  multivitamin (RENA-VIT) TABS tablet Take 1 tablet by mouth at bedtime. Patient taking differently: Take 1 tablet by mouth every evening.  08/19/18  Yes Mikhail, Maryann, DO  Pancrelipase, Lip-Prot-Amyl, (CREON) 6000 units CPEP Take 1 capsule by mouth 3 (three) times daily with meals. 09/27/17  Yes [provider]  pantoprazole (PROTONIX) 40 MG tablet Take 1 tablet (40 mg total) by mouth 2 (two) times daily. 08/19/18  Yes Mikhail, Clinical biochemist, DO  Amino Acids-Protein Hydrolys (FEEDING SUPPLEMENT, PRO-STAT SUGAR FREE 64,) LIQD Take 30 mLs by mouth 3 (three) times daily with meals. Patient not taking: Reported on 09/24/2018 08/19/18   Cristal Ford, DO   Allergies  Allergen Reactions  . Grapefruit Flavor [Flavoring Agent] Other (See Comments)    "Combination of this and another med caused a STROKE"   Review of Systems  Unable to perform ROS: Acuity of condition    Physical Exam Vitals signs and nursing note reviewed.  Constitutional:      Appearance: She is cachectic. She is ill-appearing.  Cardiovascular:     Rate and Rhythm: Normal rate.  Pulmonary:     Effort: No tachypnea, accessory muscle usage or respiratory distress.     Breath sounds: Normal breath sounds.  Skin:    General: Skin is warm and dry.  Neurological:     Mental Status: She is easily aroused.     Comments: Oriented to person. Drowsy. Reoriented to place/situation.  Psychiatric:        Attention and Perception: She is inattentive.        Speech: Speech is delayed.    Vital Signs: BP (!) 100/48   Pulse 74   Temp 97.8 F (36.6 C) (Axillary)   Resp 13   Ht 4\' 11"  (1.499 m)   Wt 38.4 kg   SpO2 100%   BMI 17.10 kg/m  Pain Scale: CPOT POSS *See Group Information*:  S-Acceptable,Sleep, easy to arouse Pain Score: 0-No pain   SpO2: SpO2: 100 % O2 Device:SpO2: 100 %  O2 Flow Rate: .   IO: Intake/output summary:   Intake/Output Summary (Last 24 hours) at 11/12/2018 1543 Last data filed at 11/12/2018 1500 Gross per 24 hour  Intake 604.08 ml  Output 0 ml  Net 604.08 ml    LBM: Last BM Date: 11/11/18 Baseline Weight: Weight: 35.9 kg Most recent weight: Weight: 38.4 kg     Palliative Assessment/Data: PPS 30%     Time In: 1540 Time Out: 1620 Time Total: 68min Greater than 50%  of this time was spent counseling and coordinating care related to the above assessment and plan.  Signed by:  Ihor Dow, DNP, FNP-C Palliative Medicine Team  Phone: [phone] Fax: [phone]   Please contact Palliative Medicine Team phone at [phone] for questions and concerns.  For individual provider: See Shea Evans

## 2018-11-12 NOTE — Progress Notes (Signed)
NAME:  LATOYA KARCHER, MRN:  [id number], DOB:  August 27, 1940, LOS: 2 ADMISSION DATE:  11/07/2018, CONSULTATION DATE:  10/30/2018   REFERRING MD:  Dr. Manuella Ghazi, CHIEF COMPLAINT:  Hypotension    History of present illness   78 year old female presents to [facility] on 9/17 with increased weakness and fatigue. Reports missing last 2 HD appointments. However, attempted to go today but was unable to complete due to hypotension. On arrival to ED patient reports right foot chronic ulcer tenderness. CXR with pulmonary vascular congestion and bilateral pleural effusions. LA 3. BP 65/49. Temp 94.8. WBC 8.5. Given 2.5 L Fluid and Zosyn. Nephrology consulted. Due to needs for CVVHD transferred to [facility].   Son reports that patient is wheelchair bound and confused at baseline. For the last week with diarrhea and decreased oral intake   Past Medical History  ESRD, CAD, DM, HLD, HTN, CVA with residual right sided weakness, Diastolic HF  Significant Hospital Events   9/17 > [facility] with sepsis/hypotension > Transferred to [facility] for CVVHD  Consults:  Nephrology PCCM   Procedures:  CVC Right IJ 9/17 at [facility] >>  Significant Diagnostic Tests:  CXR 9/17 > 1. Increasing pulmonary vascular congestion with increased small to moderate right pleural effusion. 2. No change in the left effusion. 3. Aortic atherosclerosis. DG Foot 9/17 > Diffuse osteopenia limits evaluation of the osseous structures.Question of a nondisplaced fracture seen at the base of the second proximal phalanx at the MTP joint. Sclerosis of the distal tuft of the first digit could be from chronic osteomyelitis or prior injury.  Micro Data:  Blood 9/17 >>   Antimicrobials:  Zosyn 9/17 >> 9/18 Vancomycin 9/17 >> 9/18 Ceftriaxone-9/18  Interim history/subjective:  No events overnight, off pressors  Objective   Blood pressure 123/60, pulse 72, temperature (!) 97.4 F (36.3 C), temperature source Oral, resp. rate 18, height 4\' 11"  (1.499 m), weight  38.4 kg, SpO2 100 %.        Intake/Output Summary (Last 24 hours) at 11/12/2018 1005 Last data filed at 11/12/2018 0518 Gross per 24 hour  Intake 281.43 ml  Output 0 ml  Net 281.43 ml   Filed Weights   11/17/2018 1259 11/11/18 0500 11/12/18 0500  Weight: 35.9 kg 36 kg 38.4 kg    Examination: General: Chronically ill appearing female, NAD HENT: [place]/AT, PERRL, EOM-I and MMM Lungs: CTA bilaterally Cardiovascular: RRR, Nl S1/S2 and -M/R/G Abdomen: Soft, NT, ND and +BS Extremities: contractures noted to right upper/lower extremities, right upper arm fistula, right heel ulcer    Neuro: opens eyes to stimulation, does not follow commands, pupils intact  Skin: Intact  CXR that I reviewed myself, no acute disease noted  Discussed with [facility] Problem list     Assessment & Plan:   Encephalopathy in setting of uremia vs shock state CVA with residual right side weakness   Plan -Monitor  -Treatment for infectious process  -D/C all sedating medications  Hypotension in setting of Septic vs Hypovolemic shock  Chronic Hypotension - On Midodrine  Diastolic Heart Failure  H/O CAD  Plan  -Cardiac Monitoring  -Maintain Systolic [id number] or MAP [id number] -Restart home medodrine -D/C levophed  Non Gap Metabolic Acidosis, Lactic Acidosis  ESRD on HD TTS -Has missed last 2 HD sessions, unable to complete today due to hypotension  Plan -Nephrology Consulted >> Plans for CVVD >> on arrival to [facility] BP [id number] systolic no acute indication for HD, communicated with nephrology with  plans to see in AM for possible HD  -Replace electrolytes as indicated -BMET in AM  Septic Shock, Hypothermia, now growing Proteus in blood cultures Suspect urinary source Plan -Maintain on rocephin for now -F/u on cultures -Urine culture and urinalysis.  Anemia of Chronic Kidney Disease  Plan  -Trend CBC  -Transfuse for hemoglobin <7  DM Plan -Trend Glucose -SSI   GERD Plan -PPI   Transfer to SDU and to [facility] service with PCCM off 9/20  Best practice:  Diet: NPO DVT prophylaxis: Heparin SQ  GI prophylaxis: Protonix  Glucose control: SSI Mobility: Bedrest  Code Status: FC Family Communication: Son updated at bedside  Disposition:   Labs   CBC: Recent Labs  Lab 11/06/2018 1304 11/11/18 0354  WBC 8.5 9.8  NEUTROABS 6.6  --   HGB 11.8* 8.5*  HCT 38.7 27.1*  MCV 93.0 90.9  PLT 155 122*    Basic Metabolic Panel: Recent Labs  Lab 10/29/2018 1304 11/11/18 0354  NA 137 138  K 4.4 3.9  CL 105 107  CO2 21* 21*  GLUCOSE 178* 101*  BUN 35* 35*  CREATININE 5.53* 5.35*  CALCIUM 7.2* 7.2*  MG  --  1.8  PHOS  --  3.1   GFR: Estimated Creatinine Clearance: 5.3 mL/min (A) (by C-G formula based on SCr of 5.35 mg/dL (H)). Recent Labs  Lab 11/17/2018 1304 11/01/2018 1514 11/15/2018 1522 11/11/18 0354  PROCALCITON  --   --  0.20  --   WBC 8.5  --   --  9.8  LATICACIDVEN 3.0* 2.5*  --  1.4    Liver Function Tests: Recent Labs  Lab 11/17/2018 1304  AST 22  ALT 14  ALKPHOS 65  BILITOT 0.2*  PROT 5.0*  ALBUMIN 1.6*   No results for input(s): LIPASE, AMYLASE in the last 168 hours. No results for input(s): AMMONIA in the last 168 hours.  ABG    Component Value Date/Time   PHART 7.434 09/01/2018 1941   PCO2ART 42.4 09/01/2018 1941   PO2ART 72.0 (L) 09/01/2018 1941   HCO3 28.4 (H) 09/01/2018 1941   TCO2 30 09/01/2018 1941   ACIDBASEDEF 3.8 (H) 08/04/2018 1345   O2SAT 95.0 09/01/2018 1941     Coagulation Profile: Recent Labs  Lab 11/01/2018 1304  INR 1.1    Cardiac Enzymes: No results for input(s): CKTOTAL, CKMB, CKMBINDEX, TROPONINI in the last 168 hours.  HbA1C: Hgb A1c MFr Bld  Date/Time Value Ref Range Status  11/20/2018 03:02 PM 4.6 (L) 4.8 - 5.6 % Final    Comment:    (NOTE)         Prediabetes: 5.7 - 6.4         Diabetes: >6.4         Glycemic control for adults with diabetes: <7.0   07/14/2017 04:32 AM 4.3 (L) 4.8 - 5.6 % Final     Comment:    (NOTE) Pre diabetes:          5.7%-6.4% Diabetes:              >6.4% Glycemic control for   <7.0% adults with diabetes     CBG: Recent Labs  Lab 11/12/18 0018 11/12/18 0044 11/12/18 0434 11/12/18 0556 11/12/18 0822  GLUCAP 23* 131* 58* 150* 64*   Rush Farmer, M.D. [facility] Pulmonary/Critical Care Medicine. Pager: [phone]. After hours pager: [phone].

## 2018-11-12 NOTE — Progress Notes (Signed)
  [facility] Progress Note   Assessment/ Plan:   Dialysis Orders: TTS [facility] 3h (usual runs 2h) 32.5kg 2/2.5 bath R AVG  Assessment/Plan: 1.  Hypotension/Septic Shock- pt with acute on chronic hypotension and has been unable to tolerate outpatient HD this week. GNR bacteremia noted, Proteus.  On Broad-spectrum antibiotics.  Her pressures have increased with appropriate rx.  Overall, it looks like she has had functional decline and FTT picture.  She needs a palliative care consult to help define limits of care. Pressures are better even still today so I think she can tolerate IHD.  Will attempt today.  If cannot tolerate, hospice would be appropriate at any time.  ON florinef and midodrine.  2.  ESRD -  As above.  She has not been tolerating outpatient HD due to hypotension which has worsened and she has not had any HD this week.  Poor overall prognosis and recommend palliative care consult to help set goals/limits of care as she is a full code at present  3.  Hypertension/volume  -  Off pressors, Bps improved  4.  Anemia  - stable  5.  Metabolic bone disease -  Check phos levels  6.  Severe Protein-caloric Malnutrition - alb 1.6, poor po intake.  Will need supplements and Palliative care consult as above  Subjective:    For HD today.  Sleeping, arousable, no complaints this AM   Objective:   BP 123/60   Pulse 72   Temp (!) 97.4 F (36.3 C) (Axillary)   Resp 18   Ht 4\' 11"  (1.499 m)   Wt 38.4 kg   SpO2 100%   BMI 17.10 kg/m   Physical Exam: [id number], curled up in bed CVS: RRR Resp: clear anteriorly Abd: thin Ext: severely sarcopenic, 2+ upper and lower LE edema. ACCESS: R AVG + T/B  Labs: BMET Recent Labs  Lab 11/09/2018 1304 11/11/18 0354  NA 137 138  K 4.4 3.9  CL 105 107  CO2 21* 21*  GLUCOSE 178* 101*  BUN 35* 35*  CREATININE 5.53* 5.35*  CALCIUM 7.2* 7.2*  PHOS  --  3.1   CBC Recent Labs  Lab 10/26/2018 1304  11/11/18 0354  WBC 8.5 9.8  NEUTROABS 6.6  --   HGB 11.8* 8.5*  HCT 38.7 27.1*  MCV 93.0 90.9  PLT 155 122*    @IMGRELPRIORS @ Medications:    . aspirin EC  81 mg Oral Daily  . atorvastatin  20 mg Oral Daily  . Chlorhexidine Gluconate Cloth  6 each Topical Daily  . Chlorhexidine Gluconate Cloth  6 each Topical Q0600  . escitalopram  5 mg Oral Daily  . feeding supplement (PRO-STAT SUGAR FREE 64)  30 mL Oral TID WC  . fludrocortisone  0.1 mg Oral Daily  . heparin  5,000 Units Subcutaneous Q8H  . insulin aspart  2-6 Units Subcutaneous Q4H  . lipase/protease/amylase  12,000 Units Oral TID AC  . midodrine  10 mg Oral TID WC  . multivitamin  1 tablet Oral QHS  . pantoprazole  40 mg Oral BID  . sodium chloride flush  10-40 mL Intracatheter Q12H     Madelon Lips MD [facility] pgr [phone] 11/12/2018, 8:14 AM

## 2018-11-13 LAB — CBC
HCT: 23.1 % — ABNORMAL LOW (ref 36.0–46.0)
Hemoglobin: 7.5 g/dL — ABNORMAL LOW (ref 12.0–15.0)
MCH: 29.1 pg (ref 26.0–34.0)
MCHC: 32.5 g/dL (ref 30.0–36.0)
MCV: 89.5 fL (ref 80.0–100.0)
Platelets: 81 10*3/uL — ABNORMAL LOW (ref 150–400)
RBC: 2.58 MIL/uL — ABNORMAL LOW (ref 3.87–5.11)
RDW: 22.4 % — ABNORMAL HIGH (ref 11.5–15.5)
WBC: 6.3 10*3/uL (ref 4.0–10.5)
nRBC: 0 % (ref 0.0–0.2)

## 2018-11-13 LAB — CULTURE, BLOOD (ROUTINE X 2): Special Requests: ADEQUATE

## 2018-11-13 LAB — BASIC METABOLIC PANEL
Anion gap: 6 (ref 5–15)
BUN: 20 mg/dL (ref 8–23)
CO2: 23 mmol/L (ref 22–32)
Calcium: 7.1 mg/dL — ABNORMAL LOW (ref 8.9–10.3)
Chloride: 106 mmol/L (ref 98–111)
Creatinine, Ser: 3.33 mg/dL — ABNORMAL HIGH (ref 0.44–1.00)
GFR calc Af Amer: 15 mL/min — ABNORMAL LOW (ref >60)
GFR calc non Af Amer: 13 mL/min — ABNORMAL LOW (ref >60)
Glucose, Bld: 85 mg/dL (ref 70–99)
Potassium: 3.8 mmol/L (ref 3.5–5.1)
Sodium: 135 mmol/L (ref 135–145)

## 2018-11-13 LAB — GLUCOSE, CAPILLARY
Glucose-Capillary: 104 mg/dL — ABNORMAL HIGH (ref 70–99)
Glucose-Capillary: 139 mg/dL — ABNORMAL HIGH (ref 70–99)
Glucose-Capillary: 140 mg/dL — ABNORMAL HIGH (ref 70–99)
Glucose-Capillary: 147 mg/dL — ABNORMAL HIGH (ref 70–99)
Glucose-Capillary: 153 mg/dL — ABNORMAL HIGH (ref 70–99)
Glucose-Capillary: 153 mg/dL — ABNORMAL HIGH (ref 70–99)
Glucose-Capillary: 171 mg/dL — ABNORMAL HIGH (ref 70–99)
Glucose-Capillary: 176 mg/dL — ABNORMAL HIGH (ref 70–99)
Glucose-Capillary: 49 mg/dL — ABNORMAL LOW (ref 70–99)
Glucose-Capillary: 57 mg/dL — ABNORMAL LOW (ref 70–99)
Glucose-Capillary: 59 mg/dL — ABNORMAL LOW (ref 70–99)
Glucose-Capillary: 60 mg/dL — ABNORMAL LOW (ref 70–99)
Glucose-Capillary: 76 mg/dL (ref 70–99)

## 2018-11-13 LAB — HEPATITIS B SURFACE ANTIGEN: Hepatitis B Surface Ag: NEGATIVE

## 2018-11-13 LAB — MAGNESIUM: Magnesium: 1.7 mg/dL (ref 1.7–2.4)

## 2018-11-13 LAB — PHOSPHORUS: Phosphorus: 2.1 mg/dL — ABNORMAL LOW (ref 2.5–4.6)

## 2018-11-13 NOTE — Plan of Care (Signed)

## 2018-11-13 NOTE — Progress Notes (Signed)
PROGRESS NOTE    Courtney [name]  [id number] DOB: 06-19-1940 DOA: 11/03/2018 PCP: Neale Burly, MD   Brief Narrative:   Courtney [name] is a 78 y.o. female with medical history significant for ESRD on HD TTS, chronic hypotension on midodrine, severe protein-calorie malnutrition, prior CVA, type 2 diabetes, CAD/MI, dyslipidemia, and chronic diastolic CHF, who was brought to [facility] on 11/15/2018  from her hemodialysis facility due to severe hypotension.  Her major complaints were weakness and fatigue.  Apparently she had missed her last 2 HD appointments.  She also reported right foot chronic ulcer.  Upon arrival to the ED, patient noted to be significantly hypotensive as well as hypothermic.  Initial blood pressure was 65/49 and temperature was 94.8.  She was given 2.5 L of fluid and Zosyn.  Chest x-ray showed pulmonary vascular congestion and bilateral pleural effusion.  Due to the need of nephrology consult and hemodialysis, patient was transferred to [facility].    According to her son at bedside, she is only had some random aches that have been bothering her for the last several weeks, but aside from this she apparently did not complain of any other symptoms such as fever, chills, cough, shortness of breath, chest pain, abdominal pain, nausea, or vomiting.  Son is the primary historian at bedside as patient appears quite weak and somnolent.  She also reports that she is wheelchair-bound and confused at baseline.  She also had 1 week history of diarrhea and decreased oral intake.  Before transferring to [facility], she had CVC right IJ on 917 at [facility].  She was also suspected to be having severe sepsis which was also thought to be because of her hypotension.  She was started on Zosyn and vancomycin which were subsequently switched to ceftriaxone on 11/11/2018.  Currently, she also received Levophed in the ICU for possible septic versus hypovolemic shock.  She was also encephalopathic.  Nephrology was consulted and  she had 1 session of dialysis on 11/12/2018.  She was subsequently transferred under [facility] care on 11/13/2018.   Assessment & Plan:   Active Problems:   Hypotension   Sepsis ([place])   ESRD on hemodialysis ([place])   General weakness  Hypotension in the setting of septic vs bulimic shock/chronic hypotension on midodrine/gram-negative bacteremia.:  Off of Levophed.  Blood culture from 11/08/2018 is now growing Proteus.  Presumed source urine however urine culture is negative so far.  Blood pressure much better.  Continue Rocephin.  Acute metabolic vs uremic encephalopathy: Has dementia and chronically confused.  She is pleasant but lethargic and oriented x1.  This is her baseline.  Treat underlying cause.  Non-gap metabolic acidosis/lactic acidosis/ESRD on HD TTS: Resolved.  Nephrology on board.  Received dialysis yesterday.  Further management per nephrology.  Anemia of chronic disease: Globin stable.  Monitor daily.  Transfuse if hemoglobin drops < 7.   Type 2 diabetes mellitus: Blood sugar mostly controlled with one episode of hypoglycemia this morning.  Continue SSI.  GERD: PPI  DVT prophylaxis: Heparin Code Status: Full code Family Communication:  None present at bedside.  Chart reviewed, palliative care consulted.  Noted that palliative care is going to have in person meeting with son and daughter on Monday. Disposition Plan: TBD   Consultants:   Nephrology  PCCM  Procedures:   Right CV C IJ   Antimicrobials:   IV Zosyn and vancomycin 10/31/2018> 11/11/2018  IV Rocephin 11/11/2018   Subjective: Patient seen and examined.  He is  pleasant but slightly lethargic and confused with oriented x1 to the place.  She has no complaints.  Objective: Vitals:   11/13/18 0700 11/13/18 0800 11/13/18 0900 11/13/18 1000  BP: 119/68 114/64 108/67 123/67  Pulse: 70 66 70 71  Resp: 12 (!) 9 12 20   Temp:  (!) 97.3 F (36.3 C)    TempSrc:  Oral    SpO2: 100% 100% 100% 100%  Weight:       Height:        Intake/Output Summary (Last 24 hours) at 11/13/2018 1033 Last data filed at 11/13/2018 1000 Gross per 24 hour  Intake 1115.63 ml  Output 579 ml  Net 536.63 ml   Filed Weights   11/12/18 1320 11/12/18 1632 11/13/18 0500  Weight: 38.4 kg 38.2 kg 37 kg    Examination:  General exam: Appears calm and comfortable  Respiratory system: Clear to auscultation. Respiratory effort normal. Cardiovascular system: S1 & S2 heard, RRR. No JVD, murmurs, rubs, gallops or clicks. No pedal edema. Gastrointestinal system: Abdomen is nondistended, soft and nontender. No organomegaly or masses felt. Normal bowel sounds heard. Central nervous system: Alert and orientedX1.  Chronic right-sided weakness and strictures in bilateral lower extremities due to prior stroke. Psychiatry: Judgement and insight appear poor. Mood & affect flat.   Data Reviewed: I have personally reviewed following labs and imaging studies  CBC: Recent Labs  Lab 11/22/2018 1304 11/11/18 0354 11/12/18 1348 11/13/18 0426  WBC 8.5 9.8 9.0 6.3  NEUTROABS 6.6  --   --   --   HGB 11.8* 8.5* 9.1* 7.5*  HCT 38.7 27.1* 27.8* 23.1*  MCV 93.0 90.9 88.3 89.5  PLT 155 122* 113* 81*   Basic Metabolic Panel: Recent Labs  Lab 11/17/2018 1304 11/11/18 0354 11/12/18 1348 11/13/18 0426  NA 137 138 136 135  K 4.4 3.9 3.6 3.8  CL 105 107 106 106  CO2 21* 21* 18* 23  GLUCOSE 178* 101* 120* 85  BUN 35* 35* 40* 20  CREATININE 5.53* 5.35* 5.79* 3.33*  CALCIUM 7.2* 7.2* 7.3* 7.1*  MG  --  1.8  --  1.7  PHOS  --  3.1 2.7 2.1*   GFR: Estimated Creatinine Clearance: 8.3 mL/min (A) (by C-G formula based on SCr of 3.33 mg/dL (H)). Liver Function Tests: Recent Labs  Lab 11/03/2018 1304 11/12/18 1348  AST 22  --   ALT 14  --   ALKPHOS 65  --   BILITOT 0.2*  --   PROT 5.0*  --   ALBUMIN 1.6* 1.3*   No results for input(s): LIPASE, AMYLASE in the last 168 hours. No results for input(s): AMMONIA in the last 168  hours. Coagulation Profile: Recent Labs  Lab 11/22/2018 1304  INR 1.1   Cardiac Enzymes: No results for input(s): CKTOTAL, CKMB, CKMBINDEX, TROPONINI in the last 168 hours. BNP (last 3 results) No results for input(s): PROBNP in the last 8760 hours. HbA1C: Recent Labs    10/28/2018 1502  HGBA1C 4.6*   CBG: Recent Labs  Lab 11/12/18 2244 11/12/18 2340 11/13/18 0331 11/13/18 0810 11/13/18 0901  GLUCAP 124* 104* 76 60* 147*   Lipid Profile: No results for input(s): CHOL, HDL, LDLCALC, TRIG, CHOLHDL, LDLDIRECT in the last 72 hours. Thyroid Function Tests: Recent Labs    11/22/2018 1522 11/11/18 1037  TSH 3.937 4.498  FREET4 1.22*  --    Anemia Panel: No results for input(s): VITAMINB12, FOLATE, FERRITIN, TIBC, IRON, RETICCTPCT in the last 72 hours. Sepsis Labs:  Recent Labs  Lab 11/12/2018 1304 11/11/2018 1514 11/15/2018 1522 11/11/18 0354  PROCALCITON  --   --  0.20  --   LATICACIDVEN 3.0* 2.5*  --  1.4    Recent Results (from the past 240 hour(s))  Culture, blood (Routine x 2)     Status: Abnormal   Collection Time: 11/15/2018  1:04 PM   Specimen: BLOOD  Result Value Ref Range Status   Specimen Description   Final    BLOOD LEFT ANTECUBITAL Performed at [facility], [address] [place], [place] [address]    Special Requests   Final    BOTTLES DRAWN AEROBIC AND ANAEROBIC Blood Culture adequate volume Performed at [place] [place], [place] [address]    Culture  Setup Time   Final    IN BOTH AEROBIC AND ANAEROBIC BOTTLES GRAM NEGATIVE RODS Gram Stain Report Called to,Read Back By and Verified With: A KOTEY,RN @0523  11/11/18 MKELLY CRITICAL RESULT CALLED TO, READ BACK BY AND VERIFIED WITH: T. BAUMEISTER, PHARMD AT 1000 ON 11/11/18 BY C. JESSUP, MT. Performed at [facility], [place] [address] [place], [place] [address]    Culture PROTEUS VULGARIS (A)  Final   Report Status 11/13/2018 FINAL  Final   Organism ID, Bacteria PROTEUS VULGARIS   Final      Susceptibility   Proteus vulgaris - MIC*    AMPICILLIN >=32 RESISTANT Resistant     CEFAZOLIN >=64 RESISTANT Resistant     CEFEPIME <=1 SENSITIVE Sensitive     CEFTAZIDIME <=1 SENSITIVE Sensitive     CEFTRIAXONE <=1 SENSITIVE Sensitive     CIPROFLOXACIN <=0.25 SENSITIVE Sensitive     GENTAMICIN <=1 SENSITIVE Sensitive     IMIPENEM 1 SENSITIVE Sensitive     TRIMETH/SULFA <=20 SENSITIVE Sensitive     AMPICILLIN/SULBACTAM 8 SENSITIVE Sensitive     PIP/TAZO <=4 SENSITIVE Sensitive     * PROTEUS VULGARIS  Blood Culture ID Panel (Reflexed)     Status: Abnormal   Collection Time: 11/16/2018  1:04 PM  Result Value Ref Range Status   Enterococcus species NOT DETECTED NOT DETECTED Final   Listeria monocytogenes NOT DETECTED NOT DETECTED Final   Staphylococcus species NOT DETECTED NOT DETECTED Final   Staphylococcus aureus (BCID) NOT DETECTED NOT DETECTED Final   Streptococcus species NOT DETECTED NOT DETECTED Final   Streptococcus agalactiae NOT DETECTED NOT DETECTED Final   Streptococcus pneumoniae NOT DETECTED NOT DETECTED Final   Streptococcus pyogenes NOT DETECTED NOT DETECTED Final   Acinetobacter baumannii NOT DETECTED NOT DETECTED Final   Enterobacteriaceae species DETECTED (A) NOT DETECTED Final    Comment: Enterobacteriaceae represent a large family of gram-negative bacteria, not a single organism. CRITICAL RESULT CALLED TO, READ BACK BY AND VERIFIED WITH: T. BAUMEISTER, PHARMD AT 1000 ON 11/11/18 BY C. JESSUP, MT.    Enterobacter cloacae complex NOT DETECTED NOT DETECTED Final   Escherichia coli NOT DETECTED NOT DETECTED Final   Klebsiella oxytoca NOT DETECTED NOT DETECTED Final   Klebsiella pneumoniae NOT DETECTED NOT DETECTED Final   Proteus species DETECTED (A) NOT DETECTED Final    Comment: CRITICAL RESULT CALLED TO, READ BACK BY AND VERIFIED WITH: T. BAUMEISTER, PHARMD AT 1000 ON 11/11/18 BY C. JESSUP, MT.    Serratia marcescens NOT DETECTED NOT DETECTED Final    Carbapenem resistance NOT DETECTED NOT DETECTED Final   Haemophilus influenzae NOT DETECTED NOT DETECTED Final   Neisseria meningitidis NOT DETECTED NOT DETECTED Final   Pseudomonas aeruginosa NOT  DETECTED NOT DETECTED Final   Candida albicans NOT DETECTED NOT DETECTED Final   Candida glabrata NOT DETECTED NOT DETECTED Final   Candida krusei NOT DETECTED NOT DETECTED Final   Candida parapsilosis NOT DETECTED NOT DETECTED Final   Candida tropicalis NOT DETECTED NOT DETECTED Final    Comment: Performed at [facility], [place] [address] [place], [place] [address]  SARS Coronavirus 2 [facility] order, Performed in [facility] hospital lab) Nasopharyngeal Nasopharyngeal Swab     Status: None   Collection Time: 11/14/2018  3:12 PM   Specimen: Nasopharyngeal Swab  Result Value Ref Range Status   SARS Coronavirus 2 NEGATIVE NEGATIVE Final    Comment: (NOTE) If result is NEGATIVE SARS-CoV-2 target nucleic acids are NOT DETECTED. The SARS-CoV-2 RNA is generally detectable in upper and lower  respiratory specimens during the acute phase of infection. The lowest  concentration of SARS-CoV-2 viral copies this assay can detect is 250  copies / mL. A negative result does not preclude SARS-CoV-2 infection  and should not be used as the sole basis for treatment or other  patient management decisions.  A negative result may occur with  improper specimen collection / handling, submission of specimen other  than nasopharyngeal swab, presence of viral mutation(s) within the  areas targeted by this assay, and inadequate number of viral copies  (<250 copies / mL). A negative result must be combined with clinical  observations, patient history, and epidemiological information. If result is POSITIVE SARS-CoV-2 target nucleic acids are DETECTED. The SARS-CoV-2 RNA is generally detectable in upper and lower  respiratory specimens dur ing the acute phase of infection.  Positive  results are indicative of  active infection with SARS-CoV-2.  Clinical  correlation with patient history and other diagnostic information is  necessary to determine patient infection status.  Positive results do  not rule out bacterial infection or co-infection with other viruses. If result is PRESUMPTIVE POSTIVE SARS-CoV-2 nucleic acids MAY BE PRESENT.   A presumptive positive result was obtained on the submitted specimen  and confirmed on repeat testing.  While 2019 novel coronavirus  (SARS-CoV-2) nucleic acids may be present in the submitted sample  additional confirmatory testing may be necessary for epidemiological  and / or clinical management purposes  to differentiate between  SARS-CoV-2 and other Sarbecovirus currently known to infect humans.  If clinically indicated additional testing with an alternate test  methodology [id number]) is advised. The SARS-CoV-2 RNA is generally  detectable in upper and lower respiratory sp ecimens during the acute  phase of infection. The expected result is Negative. Fact Sheet for Patients:  StrictlyIdeas.no Fact Sheet for Healthcare Providers: BankingDealers.co.za This test is not yet approved or cleared by the [place] FDA and has been authorized for detection and/or diagnosis of SARS-CoV-2 by FDA under an Emergency Use Authorization (EUA).  This EUA will remain in effect (meaning this test can be used) for the duration of the COVID-19 declaration under Section 564(b)(1) of the Act, 21 U.S.C. section 360bbb-3(b)(1), unless the authorization is terminated or revoked sooner. Performed at [facility], [address] [place], [place] [address]   Culture, blood (Routine x 2)     Status: None (Preliminary result)   Collection Time: 10/28/2018  3:14 PM   Specimen: Left Antecubital; Blood  Result Value Ref Range Status   Specimen Description LEFT ANTECUBITAL  Final   Special Requests   Final    BOTTLES DRAWN AEROBIC ONLY  Blood Culture adequate volume  Culture   Final    NO GROWTH 3 DAYS Performed at [facility], [address] [place], [place] [address]    Report Status PENDING  Incomplete  MRSA PCR Screening     Status: None   Collection Time: 10/27/2018 10:50 PM   Specimen: Nasopharyngeal  Result Value Ref Range Status   MRSA by PCR NEGATIVE NEGATIVE Final    Comment:        The GeneXpert MRSA Assay (FDA approved for NASAL specimens only), is one component of a comprehensive MRSA colonization surveillance program. It is not intended to diagnose MRSA infection nor to guide or monitor treatment for MRSA infections. Performed at [facility], [place] [address], [place], [place] [address]   C difficile quick scan w PCR reflex     Status: None   Collection Time: 10/31/2018 10:52 PM   Specimen: STOOL  Result Value Ref Range Status   C Diff antigen NEGATIVE NEGATIVE Final   C Diff toxin NEGATIVE NEGATIVE Final   C Diff interpretation No C. difficile detected.  Final    Comment: Performed at [facility], [place] [address], [place], [place] [address]  Urine Culture     Status: None (Preliminary result)   Collection Time: 11/11/18  8:33 PM   Specimen: Urine, Random  Result Value Ref Range Status   Specimen Description URINE, RANDOM  Final   Special Requests NONE  Final   Culture   Final    CULTURE REINCUBATED FOR BETTER GROWTH Performed at [facility], [place] [address] [place], [place] [address]    Report Status PENDING  Incomplete      Radiology Studies: US Abdomen Complete  Result Date: 11/11/2018 CLINICAL DATA:  Sepsis EXAM: ABDOMEN ULTRASOUND COMPLETE COMPARISON:  May 27, 2017 FINDINGS: Gallbladder: There is a mildly prominent gallbladder Turi measuring 2.6 mm. There is a small amount of pericholecystic fluid. Common bile duct: Diameter: 3.9 mm Liver: No focal lesion identified. Within normal limits in parenchymal echogenicity. Portal vein is patent on color Doppler imaging  with normal direction of blood flow towards the liver. IVC: No abnormality visualized. Pancreas: Not well visualized. Spleen: Size and appearance within normal limits. Right Kidney: Length: Not visualized due to overlying bowel gas. Left Kidney: Length: Not visualized due to overlying bowel gas. Abdominal aorta: No aneurysm visualized.  Atherosclerotic plaque. Other findings: Bilateral pleural effusions are seen. IMPRESSION: Nonspecific mild gallbladder Footman prominence and a small amount of pericholecystic fluid. Bilateral pleural effusions. Electronically Signed   By: Prudencio Pair M.D.   On: 11/11/2018 22:53    Scheduled Meds:  aspirin EC  81 mg Oral Daily   atorvastatin  20 mg Oral Daily   Chlorhexidine Gluconate Cloth  6 each Topical Daily   Chlorhexidine Gluconate Cloth  6 each Topical Q0600   escitalopram  5 mg Oral Daily   feeding supplement (PRO-STAT SUGAR FREE 64)  30 mL Oral TID WC   fludrocortisone  0.1 mg Oral Daily   heparin  5,000 Units Subcutaneous Q8H   insulin aspart  2-6 Units Subcutaneous Q4H   lipase/protease/amylase  12,000 Units Oral TID AC   midodrine  10 mg Oral TID WC   multivitamin  1 tablet Oral QHS   pantoprazole  40 mg Oral BID   sodium chloride flush  10-40 mL Intracatheter Q12H   Continuous Infusions:  sodium chloride     sodium chloride     cefTRIAXone (ROCEPHIN)  IV Stopped (11/13/18 0959)  dextrose 50 mL/hr at 11/13/18 1000     LOS: 3 days   Time spent: 35 minutes   Darliss Cheney, MD [facility] Pager [phone]  If 7PM-7AM, please contact night-coverage [email or web address] Password [facility] 11/13/2018, 10:33 AM

## 2018-11-13 NOTE — Progress Notes (Signed)
PALLIATIVE NOTE:  I called and spoke with patient's daughter, [name]. Introduced myself and she is aware I am covering for my colleague Jinny Blossom, whom she spoke with initially. [name] is still trying to get her brother to commit to a meeting time tomorrow. She states she knows it will be in the early afternoon regardless but could not give a direct time. She is requesting a call back mid-morning tomorrow with plans on having a set time. She states she will also call sooner if her brother lets her know.   Plan remains to meet with patient's daughter and son for continued GOC tomorrow. Detailed note and recommendations to follow.   Thank you for allowing Palliative to assist in patient's care.   Alda Lea, AGPCNP-BC Palliative Medicine Team  Phone: [phone] Pager: [phone] Amion: N. [place]   NO CHARGE

## 2018-11-13 NOTE — Progress Notes (Signed)
  [facility] Progress Note   Assessment/ Plan:   Dialysis Orders: TTS [facility] 3h (usual runs 2h) 32.5kg 2/2.5 bath R AVG  Assessment/Plan: 1.  Hypotension/Septic Shock- pt with acute on chronic hypotension and has been unable to tolerate outpatient HD this week. GNR bacteremia noted, Proteus.  On Broad-spectrum antibiotics.  Her pressures have increased with appropriate rx.  Overall, it looks like she has had functional decline and FTT picture.  S/p IHD yesterday, Saturday, with only 500 mL UF. On florinef and midodrine.  2. ESRD -  As above.  She has not been tolerating outpatient HD due to hypotension.  We were able to dialyze her yesterday.  Will attempt again for tomorrow, will give midodrine before rx.  3.  Hypertension/volume  -  Off pressors, Bps improved, florinef and midodrine as above  4.  Anemia  - stable  5.  Metabolic bone disease -  Check phos levels  6.  Severe Protein-caloric Malnutrition - alb 1.6, poor po intake.  supplements.  Needs someone to feed her.  7. Dispo: palliative care note appreciated.  Fam meeting tomorrow 9/21 per their notes.  Subjective:    S/p HD yesterday, only ~500 cc off.  Not eating, has to be fed.  Curled up in bed this AM.   Objective:   BP 108/67   Pulse 70   Temp (!) 97.3 F (36.3 C) (Oral)   Resp 12   Ht 4\' 11"  (1.499 m)   Wt 37 kg   SpO2 100%   BMI 16.48 kg/m   Physical Exam: [id number], curled up in bed CVS: RRR Resp: clear anteriorly Abd: thin Ext: severely sarcopenic, 2+ upper and lower LE edema, largely unchanged. ACCESS: R AVG + T/B  Labs: BMET Recent Labs  Lab 11/08/2018 1304 11/11/18 0354 11/12/18 1348 11/13/18 0426  NA 137 138 136 135  K 4.4 3.9 3.6 3.8  CL 105 107 106 106  CO2 21* 21* 18* 23  GLUCOSE 178* 101* 120* 85  BUN 35* 35* 40* 20  CREATININE 5.53* 5.35* 5.79* 3.33*  CALCIUM 7.2* 7.2* 7.3* 7.1*  PHOS  --  3.1 2.7 2.1*   CBC Recent Labs  Lab 11/21/2018 1304  11/11/18 0354 11/12/18 1348 11/13/18 0426  WBC 8.5 9.8 9.0 6.3  NEUTROABS 6.6  --   --   --   HGB 11.8* 8.5* 9.1* 7.5*  HCT 38.7 27.1* 27.8* 23.1*  MCV 93.0 90.9 88.3 89.5  PLT 155 122* 113* 81*    @IMGRELPRIORS @ Medications:    . aspirin EC  81 mg Oral Daily  . atorvastatin  20 mg Oral Daily  . Chlorhexidine Gluconate Cloth  6 each Topical Daily  . Chlorhexidine Gluconate Cloth  6 each Topical Q0600  . escitalopram  5 mg Oral Daily  . feeding supplement (PRO-STAT SUGAR FREE 64)  30 mL Oral TID WC  . fludrocortisone  0.1 mg Oral Daily  . heparin  5,000 Units Subcutaneous Q8H  . insulin aspart  2-6 Units Subcutaneous Q4H  . lipase/protease/amylase  12,000 Units Oral TID AC  . midodrine  10 mg Oral TID WC  . multivitamin  1 tablet Oral QHS  . pantoprazole  40 mg Oral BID  . sodium chloride flush  10-40 mL Intracatheter Q12H     Madelon Lips MD [facility] pgr [phone] 11/13/2018, 9:52 AM

## 2018-11-14 DIAGNOSIS — Z7189 Other specified counseling: Secondary | ICD-10-CM

## 2018-11-14 DIAGNOSIS — N185 Chronic kidney disease, stage 5: Secondary | ICD-10-CM

## 2018-11-14 LAB — GI PATHOGEN PANEL BY PCR, STOOL
Adenovirus F 40/41: NOT DETECTED
Astrovirus: NOT DETECTED
C difficile toxin A/B: NOT DETECTED
Campylobacter by PCR: DETECTED — AB
Cryptosporidium by PCR: NOT DETECTED
Cyclospora cayetanensis: NOT DETECTED
E coli (ETEC) LT/ST: NOT DETECTED
E coli (STEC): NOT DETECTED
Entamoeba histolytica: NOT DETECTED
Enteroaggregative E coli: NOT DETECTED
Enteropathogenic E coli: DETECTED — AB
G lamblia by PCR: NOT DETECTED
Norovirus GI/GII: NOT DETECTED
Plesiomonas shigelloides: NOT DETECTED
Rotavirus A by PCR: NOT DETECTED
Salmonella by PCR: NOT DETECTED
Sapovirus: NOT DETECTED
Shigella by PCR: NOT DETECTED
Vibrio cholerae: NOT DETECTED
Vibrio: NOT DETECTED
Yersinia enterocolitica: NOT DETECTED

## 2018-11-14 LAB — URINE CULTURE: Culture: >=100000 — AB

## 2018-11-14 LAB — GLUCOSE, CAPILLARY
Glucose-Capillary: 46 mg/dL — ABNORMAL LOW (ref 70–99)
Glucose-Capillary: 48 mg/dL — ABNORMAL LOW (ref 70–99)
Glucose-Capillary: 87 mg/dL (ref 70–99)
Glucose-Capillary: 133 mg/dL — ABNORMAL HIGH (ref 70–99)
Glucose-Capillary: 44 mg/dL — CL (ref 70–99)
Glucose-Capillary: 68 mg/dL — ABNORMAL LOW (ref 70–99)
Glucose-Capillary: 72 mg/dL (ref 70–99)
Glucose-Capillary: 90 mg/dL (ref 70–99)

## 2018-11-14 MED ORDER — DEXTROSE 10 % IV SOLN
INTRAVENOUS | Status: DC
  Administered 2018-11-14 – 2018-11-18 (×8): via INTRAVENOUS

## 2018-11-14 MED ORDER — ALBUMIN HUMAN 25 % IV SOLN
INTRAVENOUS | Status: AC
  Administered 2018-11-14: 14:00:00 25 g
  Filled 2018-11-14: qty 200

## 2018-11-14 NOTE — Progress Notes (Signed)
  [facility] Progress Note    Assessment/ Plan:   Dialysis Orders:TTS [facility] 3h (usual runs 2h) 32.5kg 2/2.5 bath R AVG  Assessment/Plan: 1. Hypotension/Septic Shock- pt with acute on chronic hypotension and has been unable to tolerate outpatient HD this week. GNR bacteremia noted, Proteus.  On Broad-spectrum antibiotics.  Her pressures have increased with appropriate rx.  Overall, it looks like she has had functional decline and FTT picture.  S/p IHD yesterday, Saturday, with only 500 mL UF. On florinef and midodrine.  2. ESRD- As above. She has not been tolerating outpatient HD due to hypotension.   - Attempting to dialyze her 9/21; hopefully she will tolerate. Giving midodrine and albumin before rx.  3. Hypertension/volume- Off pressors, Bps improved, florinef and midodrine as above  4. Anemia- stable  5. Metabolic bone disease- Check phos levels; 2.1. not on binders. Function of poor nutritional status.  6. Severe Protein-caloric Malnutrition- alb 1.6, poor po intake. supplements.  Needs someone to feed her.  7. Dispo: palliative care note appreciated.  Fam meeting 9/21 per their notes.  Subjective:   No f/c/n/v.  No appetite.   Objective:   BP (!) 104/50 (BP Location: Left Arm)   Pulse 70   Temp (!) 95.8 F (35.4 C) (Axillary)   Resp 10   Ht 4\' 11"  (1.499 m)   Wt 38.5 kg   SpO2 100%   BMI 17.14 kg/m   Intake/Output Summary (Last 24 hours) at 11/14/2018 1028 Last data filed at 11/14/2018 1000 Gross per 24 hour  Intake 1068.62 ml  Output 1 ml  Net 1067.62 ml   Weight change: 0.1 kg  Physical Exam: [id number], curled up in bed CVS: RRR Resp: clear anteriorly Abd: thin Ext: severely sarcopenic, 2+ upper and lower LE edema. ACCESS: R AVG + T/B  Imaging: No results found.  Labs: BMET Recent Labs  Lab 10/27/2018 1304 11/11/18 0354 11/12/18 1348 11/13/18 0426  NA 137 138 136 135  K 4.4 3.9 3.6 3.8   CL 105 107 106 106  CO2 21* 21* 18* 23  GLUCOSE 178* 101* 120* 85  BUN 35* 35* 40* 20  CREATININE 5.53* 5.35* 5.79* 3.33*  CALCIUM 7.2* 7.2* 7.3* 7.1*  PHOS  --  3.1 2.7 2.1*   CBC Recent Labs  Lab 11/04/2018 1304 11/11/18 0354 11/12/18 1348 11/13/18 0426  WBC 8.5 9.8 9.0 6.3  NEUTROABS 6.6  --   --   --   HGB 11.8* 8.5* 9.1* 7.5*  HCT 38.7 27.1* 27.8* 23.1*  MCV 93.0 90.9 88.3 89.5  PLT 155 122* 113* 81*    Medications:    . aspirin EC  81 mg Oral Daily  . atorvastatin  20 mg Oral Daily  . Chlorhexidine Gluconate Cloth  6 each Topical Daily  . Chlorhexidine Gluconate Cloth  6 each Topical Q0600  . escitalopram  5 mg Oral Daily  . feeding supplement (PRO-STAT SUGAR FREE 64)  30 mL Oral TID WC  . fludrocortisone  0.1 mg Oral Daily  . heparin  5,000 Units Subcutaneous Q8H  . insulin aspart  2-6 Units Subcutaneous Q4H  . lipase/protease/amylase  12,000 Units Oral TID AC  . midodrine  10 mg Oral TID WC  . multivitamin  1 tablet Oral QHS  . pantoprazole  40 mg Oral BID  . sodium chloride flush  10-40 mL Intracatheter Q12H      Otelia Santee, MD 11/14/2018, 10:28 AM

## 2018-11-14 NOTE — Progress Notes (Signed)
Daughter called requesting to meet today @ 2pm. Confirmed with her and family meeting is scheduled with daughter and son today @ 2pm. Detailed notes and recommendations to follow.   Alda Lea, AGPCNP-BC Palliative Medicine Team  Phone: [phone] Pager: [phone]

## 2018-11-14 NOTE — Progress Notes (Signed)
Hypoglycemic Event  CBG: 48   Treatment:8 oz ginger ale  Symptoms: none  Follow-up CBG: Time: 0451 CBG Result: 87  Possible Reasons for Event: dialysis pt.  Comments/MD notified: Elink notified, switched CBG checks to Q2hr     Evonnie Dawes

## 2018-11-14 NOTE — Progress Notes (Signed)
Inpatient Diabetes Program Recommendations  AACE/ADA: New Consensus Statement on Inpatient Glycemic Control   Target Ranges:  Prepandial:   less than 140 mg/dL      Peak postprandial:   less than 180 mg/dL (1-2 hours)      Critically ill patients:  140 - 180 mg/dL   Results for [name], Courtney C (MRN [id number]) as of 11/14/2018 08:58  Ref. Range 11/13/2018 20:56 11/13/2018 23:34 11/14/2018 04:07 11/14/2018 04:09 11/14/2018 04:51 11/14/2018 07:48 11/14/2018 07:50 11/14/2018 08:12  Glucose-Capillary Latest Ref Range: 70 - 99 mg/dL 171 (H) 140 (H)  Novolog 2 units 48 (L) 46 (L) 87 23 (LL) 44 (LL) 133 (H)  Results for [name], Courtney C (MRN [id number]) as of 11/14/2018 08:58  Ref. Range 11/13/2018 09:01 11/13/2018 11:11 11/13/2018 16:45 11/13/2018 17:30 11/13/2018 20:26  Glucose-Capillary Latest Ref Range: 70 - 99 mg/dL 147 (H) 153 (H) 59 (L) 139 (H) 57 (L)   Review of Glycemic Control  Diabetes history: DM2 Outpatient Diabetes medications: None Current orders for Inpatient glycemic control: Novolog 2-6 units Q4H  Inpatient Diabetes Program Recommendations:   Correction (SSI): Please consider discontinuing Novolog correction scale but continue monitoring glucose Q4H.  Thanks, Barnie Alderman, RN, MSN, CDE Diabetes Coordinator Inpatient Diabetes Program [phone] (Team Pager from 8am to 5pm)

## 2018-11-14 NOTE — Progress Notes (Signed)
Hypoglycemic Event  CBG: 57  Treatment:8 Fl Oz Ginger ale  Symptoms: none  Follow-up CBG: Time: 20:56 CBG Result: 171  Possible Reasons for Event: Dialysis pt.   Comments/MD notified: Elink notified     Courtney [name]

## 2018-11-14 NOTE — Progress Notes (Addendum)
PROGRESS NOTE    Courtney [name]  [id number] DOB: 1940/05/08 DOA: 11/01/2018 PCP: Neale Burly, MD   Brief Narrative:   Courtney [name] is a 78 y.o. female with medical history significant for ESRD on HD TTS, chronic hypotension on midodrine, severe protein-calorie malnutrition, prior CVA, type 2 diabetes, CAD/MI, dyslipidemia, and chronic diastolic CHF, who was brought to [place] on 10/26/2018  from her hemodialysis facility due to severe hypotension.  Her major complaints were weakness and fatigue.  Apparently she had missed her last 2 HD appointments.  She also reported right foot chronic ulcer.  Upon arrival to the ED, patient noted to be significantly hypotensive as well as hypothermic.  Initial blood pressure was 65/49 and temperature was 94.8.  She was given 2.5 L of fluid and Zosyn.  Chest x-ray showed pulmonary vascular congestion and bilateral pleural effusion.  Due to the need of nephrology consult and hemodialysis, patient was transferred to [facility].    According to her son at bedside, she is only had some random aches that have been bothering her for the last several weeks, but aside from this she apparently did not complain of any other symptoms such as fever, chills, cough, shortness of breath, chest pain, abdominal pain, nausea, or vomiting.  Son is the primary historian at bedside as patient appears quite weak and somnolent.  She also reports that she is wheelchair-bound and confused at baseline.  She also had 1 week history of diarrhea and decreased oral intake.  Before transferring to [facility], she had CVC right IJ on 917 at [facility].  She was also suspected to be having severe sepsis which was also thought to be because of her hypotension.  She was started on Zosyn and vancomycin which were subsequently switched to ceftriaxone on 11/11/2018.  Currently, she also received Levophed in the ICU for possible septic versus hypovolemic shock.  She was also encephalopathic.  Nephrology was consulted and  she had 1 session of dialysis on 11/12/2018.  She was subsequently transferred under [facility] care on 11/13/2018.   Assessment & Plan:   Active Problems:   Hypotension   Sepsis ([place])   ESRD on hemodialysis ([place])   General weakness  Hypotension in the setting of septic vs bulimic shock/chronic hypotension on midodrine/gram-negative bacteremia.:  Off of Levophed.  Blood culture from 11/01/2018 is now growing Proteus.  Presumed source urine however urine culture is negative so far.  Blood pressure much better.  Continue Rocephin.  Acute metabolic vs uremic encephalopathy: Has dementia and chronically confused.  She is pleasant but lethargic and oriented x1.  This is her baseline.  Treat underlying cause.  Non-gap metabolic acidosis/lactic acidosis/ESRD on HD TTS: Resolved.  Nephrology on board.  Received dialysis yesterday.  Further management per nephrology.  Anemia of chronic disease: Globin stable.  Monitor daily.  Transfuse if hemoglobin drops < 7.   Type 2 diabetes mellitus: Persistent hypoglycemia despite of being on 5% dextrose.  We will switch to 10% dextrose.  No symptoms of hypoglycemia.  GERD: PPI  Goal of care: Palliative care on board.  I see that they are working on making arrangements to have face-to-face meeting with daughter and son.  We appreciate their help.  DVT prophylaxis: Heparin Code Status: Full code Family Communication:  None present at bedside.  Noted that palliative care is going to have in person meeting with son and daughter on Monday. Disposition Plan: TBD   Consultants:   Nephrology  PCCM  Procedures:  Right CV C IJ 11/09/2018  Antimicrobials:   IV Zosyn and vancomycin 11/02/2018> 11/11/2018  IV Rocephin 11/11/2018   Subjective: Seen and examined.  She has no complaints.  She is alert and oriented x2, at her baseline.  Objective: Vitals:   11/14/18 0500 11/14/18 0600 11/14/18 0700 11/14/18 0744  BP:      Pulse: 67 65 64   Resp: 12 10 16      Temp:    (!) 95.8 F (35.4 C)  TempSrc:    Axillary  SpO2: 95% 99% 100%   Weight: 38.5 kg     Height:        Intake/Output Summary (Last 24 hours) at 11/14/2018 1020 Last data filed at 11/14/2018 0926 Gross per 24 hour  Intake 893.19 ml  Output 1 ml  Net 892.19 ml   Filed Weights   11/12/18 1632 11/13/18 0500 11/14/18 0500  Weight: 38.2 kg 37 kg 38.5 kg    Examination:  General exam: Appears calm and comfortable  Respiratory system: Clear to auscultation. Respiratory effort normal. Cardiovascular system: S1 & S2 heard, RRR. No JVD, murmurs, rubs, gallops or clicks.  Bilateral upper extremity +2 pitting edema Gastrointestinal system: Abdomen is nondistended, soft and nontender. No organomegaly or masses felt. Normal bowel sounds heard. Central nervous system: Alert and oriented x2.  Has a structures in bilateral lower extremities.  Weakness in right upper extremity from known strokes in the past.   Psychiatry: Judgement and insight appear poor. Mood & affect flat.   Data Reviewed: I have personally reviewed following labs and imaging studies  CBC: Recent Labs  Lab 11/13/2018 1304 11/11/18 0354 11/12/18 1348 11/13/18 0426  WBC 8.5 9.8 9.0 6.3  NEUTROABS 6.6  --   --   --   HGB 11.8* 8.5* 9.1* 7.5*  HCT 38.7 27.1* 27.8* 23.1*  MCV 93.0 90.9 88.3 89.5  PLT 155 122* 113* 81*   Basic Metabolic Panel: Recent Labs  Lab 11/15/2018 1304 11/11/18 0354 11/12/18 1348 11/13/18 0426  NA 137 138 136 135  K 4.4 3.9 3.6 3.8  CL 105 107 106 106  CO2 21* 21* 18* 23  GLUCOSE 178* 101* 120* 85  BUN 35* 35* 40* 20  CREATININE 5.53* 5.35* 5.79* 3.33*  CALCIUM 7.2* 7.2* 7.3* 7.1*  MG  --  1.8  --  1.7  PHOS  --  3.1 2.7 2.1*   GFR: Estimated Creatinine Clearance: 8.6 mL/min (A) (by C-G formula based on SCr of 3.33 mg/dL (H)). Liver Function Tests: Recent Labs  Lab 11/23/2018 1304 11/12/18 1348  AST 22  --   ALT 14  --   ALKPHOS 65  --   BILITOT 0.2*  --   PROT 5.0*  --    ALBUMIN 1.6* 1.3*   No results for input(s): LIPASE, AMYLASE in the last 168 hours. No results for input(s): AMMONIA in the last 168 hours. Coagulation Profile: Recent Labs  Lab 11/08/2018 1304  INR 1.1   Cardiac Enzymes: No results for input(s): CKTOTAL, CKMB, CKMBINDEX, TROPONINI in the last 168 hours. BNP (last 3 results) No results for input(s): PROBNP in the last 8760 hours. HbA1C: No results for input(s): HGBA1C in the last 72 hours. CBG: Recent Labs  Lab 11/14/18 0451 11/14/18 0748 11/14/18 0750 11/14/18 0812 11/14/18 1001  GLUCAP 87 23* 44* 133* 68*   Lipid Profile: No results for input(s): CHOL, HDL, LDLCALC, TRIG, CHOLHDL, LDLDIRECT in the last 72 hours. Thyroid Function Tests: Recent Labs  11/11/18 1037  TSH 4.498   Anemia Panel: No results for input(s): VITAMINB12, FOLATE, FERRITIN, TIBC, IRON, RETICCTPCT in the last 72 hours. Sepsis Labs: Recent Labs  Lab 11/08/2018 1304 11/21/2018 1514 11/14/2018 1522 11/11/18 0354  PROCALCITON  --   --  0.20  --   LATICACIDVEN 3.0* 2.5*  --  1.4    Recent Results (from the past 240 hour(s))  Culture, blood (Routine x 2)     Status: Abnormal   Collection Time: 11/09/2018  1:04 PM   Specimen: BLOOD  Result Value Ref Range Status   Specimen Description   Final    BLOOD LEFT ANTECUBITAL Performed at [facility], [address], [place], [place] [address]    Special Requests   Final    BOTTLES DRAWN AEROBIC AND ANAEROBIC Blood Culture adequate volume Performed at [place] [place], [place] [address]    Culture  Setup Time   Final    IN BOTH AEROBIC AND ANAEROBIC BOTTLES GRAM NEGATIVE RODS Gram Stain Report Called to,Read Back By and Verified With: A KOTEY,RN @0523  11/11/18 MKELLY CRITICAL RESULT CALLED TO, READ BACK BY AND VERIFIED WITH: T. BAUMEISTER, PHARMD AT 1000 ON 11/11/18 BY C. JESSUP, MT. Performed at [facility], [place] [address] [place], [place] [address]    Culture PROTEUS  VULGARIS (A)  Final   Report Status 11/13/2018 FINAL  Final   Organism ID, Bacteria PROTEUS VULGARIS  Final      Susceptibility   Proteus vulgaris - MIC*    AMPICILLIN >=32 RESISTANT Resistant     CEFAZOLIN >=64 RESISTANT Resistant     CEFEPIME <=1 SENSITIVE Sensitive     CEFTAZIDIME <=1 SENSITIVE Sensitive     CEFTRIAXONE <=1 SENSITIVE Sensitive     CIPROFLOXACIN <=0.25 SENSITIVE Sensitive     GENTAMICIN <=1 SENSITIVE Sensitive     IMIPENEM 1 SENSITIVE Sensitive     TRIMETH/SULFA <=20 SENSITIVE Sensitive     AMPICILLIN/SULBACTAM 8 SENSITIVE Sensitive     PIP/TAZO <=4 SENSITIVE Sensitive     * PROTEUS VULGARIS  Blood Culture ID Panel (Reflexed)     Status: Abnormal   Collection Time: 11/17/2018  1:04 PM  Result Value Ref Range Status   Enterococcus species NOT DETECTED NOT DETECTED Final   Listeria monocytogenes NOT DETECTED NOT DETECTED Final   Staphylococcus species NOT DETECTED NOT DETECTED Final   Staphylococcus aureus (BCID) NOT DETECTED NOT DETECTED Final   Streptococcus species NOT DETECTED NOT DETECTED Final   Streptococcus agalactiae NOT DETECTED NOT DETECTED Final   Streptococcus pneumoniae NOT DETECTED NOT DETECTED Final   Streptococcus pyogenes NOT DETECTED NOT DETECTED Final   Acinetobacter baumannii NOT DETECTED NOT DETECTED Final   Enterobacteriaceae species DETECTED (A) NOT DETECTED Final    Comment: Enterobacteriaceae represent a large family of gram-negative bacteria, not a single organism. CRITICAL RESULT CALLED TO, READ BACK BY AND VERIFIED WITH: T. BAUMEISTER, PHARMD AT 1000 ON 11/11/18 BY C. JESSUP, MT.    Enterobacter cloacae complex NOT DETECTED NOT DETECTED Final   Escherichia coli NOT DETECTED NOT DETECTED Final   Klebsiella oxytoca NOT DETECTED NOT DETECTED Final   Klebsiella pneumoniae NOT DETECTED NOT DETECTED Final   Proteus species DETECTED (A) NOT DETECTED Final    Comment: CRITICAL RESULT CALLED TO, READ BACK BY AND VERIFIED WITH: T. BAUMEISTER,  PHARMD AT 1000 ON 11/11/18 BY C. JESSUP, MT.    Serratia marcescens NOT DETECTED NOT DETECTED Final   Carbapenem resistance NOT DETECTED  NOT DETECTED Final   Haemophilus influenzae NOT DETECTED NOT DETECTED Final   Neisseria meningitidis NOT DETECTED NOT DETECTED Final   Pseudomonas aeruginosa NOT DETECTED NOT DETECTED Final   Candida albicans NOT DETECTED NOT DETECTED Final   Candida glabrata NOT DETECTED NOT DETECTED Final   Candida krusei NOT DETECTED NOT DETECTED Final   Candida parapsilosis NOT DETECTED NOT DETECTED Final   Candida tropicalis NOT DETECTED NOT DETECTED Final    Comment: Performed at [facility], [place] [address] [place], [place] [address]  SARS Coronavirus 2 [facility] order, Performed in [facility] hospital lab) Nasopharyngeal Nasopharyngeal Swab     Status: None   Collection Time: 11/17/2018  3:12 PM   Specimen: Nasopharyngeal Swab  Result Value Ref Range Status   SARS Coronavirus 2 NEGATIVE NEGATIVE Final    Comment: (NOTE) If result is NEGATIVE SARS-CoV-2 target nucleic acids are NOT DETECTED. The SARS-CoV-2 RNA is generally detectable in upper and lower  respiratory specimens during the acute phase of infection. The lowest  concentration of SARS-CoV-2 viral copies this assay can detect is 250  copies / mL. A negative result does not preclude SARS-CoV-2 infection  and should not be used as the sole basis for treatment or other  patient management decisions.  A negative result may occur with  improper specimen collection / handling, submission of specimen other  than nasopharyngeal swab, presence of viral mutation(s) within the  areas targeted by this assay, and inadequate number of viral copies  (<250 copies / mL). A negative result must be combined with clinical  observations, patient history, and epidemiological information. If result is POSITIVE SARS-CoV-2 target nucleic acids are DETECTED. The SARS-CoV-2 RNA is generally detectable in upper and lower    respiratory specimens dur ing the acute phase of infection.  Positive  results are indicative of active infection with SARS-CoV-2.  Clinical  correlation with patient history and other diagnostic information is  necessary to determine patient infection status.  Positive results do  not rule out bacterial infection or co-infection with other viruses. If result is PRESUMPTIVE POSTIVE SARS-CoV-2 nucleic acids MAY BE PRESENT.   A presumptive positive result was obtained on the submitted specimen  and confirmed on repeat testing.  While 2019 novel coronavirus  (SARS-CoV-2) nucleic acids may be present in the submitted sample  additional confirmatory testing may be necessary for epidemiological  and / or clinical management purposes  to differentiate between  SARS-CoV-2 and other Sarbecovirus currently known to infect humans.  If clinically indicated additional testing with an alternate test  methodology [phone]) is advised. The SARS-CoV-2 RNA is generally  detectable in upper and lower respiratory sp ecimens during the acute  phase of infection. The expected result is Negative. Fact Sheet for Patients:  StrictlyIdeas.no Fact Sheet for Healthcare Providers: BankingDealers.co.za This test is not yet approved or cleared by the [place] FDA and has been authorized for detection and/or diagnosis of SARS-CoV-2 by FDA under an Emergency Use Authorization (EUA).  This EUA will remain in effect (meaning this test can be used) for the duration of the COVID-19 declaration under Section 564(b)(1) of the Act, 21 U.S.C. section 360bbb-3(b)(1), unless the authorization is terminated or revoked sooner. Performed at [facility], [address] [place], [place] [id number]   Culture, blood (Routine x 2)     Status: None (Preliminary result)   Collection Time: 11/01/2018  3:14 PM   Specimen: Left Antecubital; Blood  Result Value Ref Range Status  Specimen Description LEFT ANTECUBITAL  Final   Special Requests   Final    BOTTLES DRAWN AEROBIC ONLY Blood Culture adequate volume   Culture   Final    NO GROWTH 4 DAYS Performed at [facility], [address] [place], [place] [address]    Report Status PENDING  Incomplete  MRSA PCR Screening     Status: None   Collection Time: 11/02/2018 10:50 PM   Specimen: Nasopharyngeal  Result Value Ref Range Status   MRSA by PCR NEGATIVE NEGATIVE Final    Comment:        The GeneXpert MRSA Assay (FDA approved for NASAL specimens only), is one component of a comprehensive MRSA colonization surveillance program. It is not intended to diagnose MRSA infection nor to guide or monitor treatment for MRSA infections. Performed at [facility], [place] [address], [place], [place] [address]   C difficile quick scan w PCR reflex     Status: None   Collection Time: 11/09/2018 10:52 PM   Specimen: STOOL  Result Value Ref Range Status   C Diff antigen NEGATIVE NEGATIVE Final   C Diff toxin NEGATIVE NEGATIVE Final   C Diff interpretation No C. difficile detected.  Final    Comment: Performed at [facility], [place] [address], [place], [place] [address]  Urine Culture     Status: Abnormal   Collection Time: 11/11/18  8:33 PM   Specimen: Urine, Random  Result Value Ref Range Status   Specimen Description URINE, RANDOM  Final   Special Requests   Final    NONE Performed at [facility], [place] [address], [place], [place] [address]    Culture (A)  Final    >=100,000 COLONIES/mL STAPHYLOCOCCUS SPECIES (COAGULASE NEGATIVE)   Report Status 11/14/2018 FINAL  Final   Organism ID, Bacteria STAPHYLOCOCCUS SPECIES (COAGULASE NEGATIVE) (A)  Final      Susceptibility   Staphylococcus species (coagulase negative) - MIC*    CIPROFLOXACIN >=8 RESISTANT Resistant     GENTAMICIN <=0.5 SENSITIVE Sensitive     NITROFURANTOIN <=16 SENSITIVE Sensitive     OXACILLIN >=4 RESISTANT Resistant      TETRACYCLINE <=1 SENSITIVE Sensitive     VANCOMYCIN <=0.5 SENSITIVE Sensitive     TRIMETH/SULFA <=10 SENSITIVE Sensitive     CLINDAMYCIN <=0.25 SENSITIVE Sensitive     RIFAMPIN <=0.5 SENSITIVE Sensitive     Inducible Clindamycin NEGATIVE Sensitive     * >=100,000 COLONIES/mL STAPHYLOCOCCUS SPECIES (COAGULASE NEGATIVE)      Radiology Studies: No results found.  Scheduled Meds:  aspirin EC  81 mg Oral Daily   atorvastatin  20 mg Oral Daily   Chlorhexidine Gluconate Cloth  6 each Topical Daily   Chlorhexidine Gluconate Cloth  6 each Topical Q0600   escitalopram  5 mg Oral Daily   feeding supplement (PRO-STAT SUGAR FREE 64)  30 mL Oral TID WC   fludrocortisone  0.1 mg Oral Daily   heparin  5,000 Units Subcutaneous Q8H   insulin aspart  2-6 Units Subcutaneous Q4H   lipase/protease/amylase  12,000 Units Oral TID AC   midodrine  10 mg Oral TID WC   multivitamin  1 tablet Oral QHS   pantoprazole  40 mg Oral BID   sodium chloride flush  10-40 mL Intracatheter Q12H   Continuous Infusions:  sodium chloride     sodium chloride     cefTRIAXone (ROCEPHIN)  IV 2 g (11/14/18 0930)   dextrose 50 mL/hr at  11/14/18 0756     LOS: 4 days   Time spent: 29 minutes   Darliss Cheney, MD [facility] Pager [phone]  If 7PM-7AM, please contact night-coverage [email or web address] Password [email or web address] 11/14/2018, 10:20 AM

## 2018-11-15 LAB — GLUCOSE, CAPILLARY
Glucose-Capillary: 134 mg/dL — ABNORMAL HIGH (ref 70–99)
Glucose-Capillary: 147 mg/dL — ABNORMAL HIGH (ref 70–99)
Glucose-Capillary: 187 mg/dL — ABNORMAL HIGH (ref 70–99)
Glucose-Capillary: 44 mg/dL — CL (ref 70–99)
Glucose-Capillary: 54 mg/dL — ABNORMAL LOW (ref 70–99)
Glucose-Capillary: 61 mg/dL — ABNORMAL LOW (ref 70–99)
Glucose-Capillary: 67 mg/dL — ABNORMAL LOW (ref 70–99)
Glucose-Capillary: 68 mg/dL — ABNORMAL LOW (ref 70–99)
Glucose-Capillary: 94 mg/dL (ref 70–99)
Glucose-Capillary: 62 mg/dL — ABNORMAL LOW (ref 70–99)
Glucose-Capillary: 68 mg/dL — ABNORMAL LOW (ref 70–99)
Glucose-Capillary: 85 mg/dL (ref 70–99)
Glucose-Capillary: 23 mg/dL — CL (ref 70–99)
Glucose-Capillary: 98 mg/dL (ref 70–99)
Glucose-Capillary: 81 mg/dL (ref 70–99)
Glucose-Capillary: 108 mg/dL — ABNORMAL HIGH (ref 70–99)
Glucose-Capillary: 82 mg/dL (ref 70–99)

## 2018-11-15 LAB — CULTURE, BLOOD (ROUTINE X 2)
Culture: NO GROWTH
Special Requests: ADEQUATE

## 2018-11-15 MED ORDER — BOOST / RESOURCE BREEZE PO LIQD CUSTOM
1.0000 | Freq: Three times a day (TID) | ORAL | Status: DC
  Administered 2018-11-15: 0.25 via ORAL

## 2018-11-15 NOTE — Progress Notes (Signed)
  [facility] Progress Note    Assessment/ Plan:   Dialysis Orders:TTS [facility] 3h (usual runs 2h) 32.5kg 2/2.5 bath R AVG  Assessment/Plan: 1. Hypotension/Septic Shock- pt with acute on chronic hypotension and has been unable to tolerate outpatient HD this week. GNR bacteremia noted, Proteus. On Broad-spectrum antibiotics. Her pressures have increased with appropriate rx. Overall, it looks like she has had functional decline and FTT picture. S/p IHD yesterday, Saturday, with only 500 mL UF.Onflorinef and midodrine.  2. ESRD- As above. She has not been tolerating outpatient HD due to hypotension.  - Tolerated HD  9/21; will hold off on HD till after the meeting tomorrow.  Need to give midodrine and albumin before rxs.  3. Hypertension/volume- Off pressors, Bps improved, florinef and midodrine as above  4. Anemia- stable  5. Metabolic bone disease- Check phos levels; 2.1. not on binders. Function of poor nutritional status.  6. Severe Protein-caloric Malnutrition- alb 1.6, poor po intake.supplements. Needs someone to feed her.  7. Dispo: palliative care note appreciated. Fam meeting 9/21 per their notes.  Subjective:   No new complaints. Feels cold.   Objective:   BP 132/65   Pulse 76   Temp (!) 94.5 F (34.7 C) (Axillary)   Resp 16   Ht 4\' 11"  (1.499 m)   Wt 40.6 kg   SpO2 99%   BMI 18.08 kg/m   Intake/Output Summary (Last 24 hours) at 11/15/2018 1019 Last data filed at 11/15/2018 1000 Gross per 24 hour  Intake 1118.37 ml  Output 1500 ml  Net -381.63 ml   Weight change: 3 kg  Physical Exam: [id number], curled up in bed CVS: RRR Resp: clear anteriorly Abd: thin Ext: severely sarcopenic, 2+ upper and lower LE edema. ACCESS: R AVG + T/B  Imaging: No results found.  Labs: BMET Recent Labs  Lab 11/16/2018 1304 11/11/18 0354 11/12/18 1348 11/13/18 0426  NA 137 138 136 135  K 4.4 3.9 3.6 3.8  CL  105 107 106 106  CO2 21* 21* 18* 23  GLUCOSE 178* 101* 120* 85  BUN 35* 35* 40* 20  CREATININE 5.53* 5.35* 5.79* 3.33*  CALCIUM 7.2* 7.2* 7.3* 7.1*  PHOS  --  3.1 2.7 2.1*   CBC Recent Labs  Lab 10/26/2018 1304 11/11/18 0354 11/12/18 1348 11/13/18 0426  WBC 8.5 9.8 9.0 6.3  NEUTROABS 6.6  --   --   --   HGB 11.8* 8.5* 9.1* 7.5*  HCT 38.7 27.1* 27.8* 23.1*  MCV 93.0 90.9 88.3 89.5  PLT 155 122* 113* 81*    Medications:    . aspirin EC  81 mg Oral Daily  . atorvastatin  20 mg Oral Daily  . Chlorhexidine Gluconate Cloth  6 each Topical Daily  . Chlorhexidine Gluconate Cloth  6 each Topical Q0600  . escitalopram  5 mg Oral Daily  . feeding supplement (PRO-STAT SUGAR FREE 64)  30 mL Oral TID WC  . fludrocortisone  0.1 mg Oral Daily  . heparin  5,000 Units Subcutaneous Q8H  . insulin aspart  2-6 Units Subcutaneous Q4H  . lipase/protease/amylase  12,000 Units Oral TID AC  . midodrine  10 mg Oral TID WC  . multivitamin  1 tablet Oral QHS  . pantoprazole  40 mg Oral BID  . sodium chloride flush  10-40 mL Intracatheter Q12H      Otelia Santee, MD 11/15/2018, 10:19 AM

## 2018-11-15 NOTE — Progress Notes (Signed)
Renal Navigator received call from Palliative Care RN/Melanie who states she would like assistance scheduling a meeting between patient's son/James and Nephrologist, per patient's daughter/[name]. Per Barnabas Lister and [name] spoke with Palliative Care NP yesterday and [name] feels her brother would benefit from talking directly with Dr. Lurlean Horns. Renal Navigator appreciates call from [place] and opportunity to arrange family meeting. Renal Navigator called patient's daughter/[name], who states that she is primary medical decision maker, and that her brother is secondary Media planner. She states she has processed the situation and her mother's deteriorating health. She reports that she is a Marine scientist. She states that her brother will not hear the severity of their mother's condition from her. Navigator validated that she should not have to be the one to explain their mother's condition and that meeting with her and her brother together, face to face with the Nephrologist is not a problem. Renal Navigator offered to attend for support and she accepted. She would like her mother to be included, which Navigator supports. Family meeting to be held in patient's room tomorrow, 11/16/18 at 3pm with patient, daughter, son, Dr. Lin/Nephrologist, and Renal Navigator.  Alphonzo Cruise, [place] Renal Navigator [phone]

## 2018-11-15 NOTE — Progress Notes (Addendum)
Hypoglycemic Event  CBG: 67  Treatment:4 oz ginger ale Symptoms: none  Follow-up CBG: Time: 0632 CBG Result:62  Treatment:4 oz ginger ale Symptoms: none  Follow-up CBG: Time: [id number] CBG Result: 68  Treatment:4 oz ginger ale Symptoms: none  Follow-up CBG: Time: 0715 CBG Result: 85  Possible Reasons for Event: Dialysis pt.  Comments/MD notified: [place]    Evonnie Dawes

## 2018-11-15 NOTE — Progress Notes (Addendum)
Hypoglycemic Event  CBG: 44  Treatment:8 oz ginger ale Symptoms: none  Follow-up CBG: Time: 0048 CBG Result: 54  Treatment: 8 Oz ginger ale Symptoms: none  Follow-up CBG Time: 0113 CBG Result: 61  Treatment: 4 Oz ginger ale Symptoms: none  Follow-up CBG Time: 0135 CBG Result: 68  Treatment: 4 Oz ginger ale  Increased D10 to 73mL/hr (per MD order) Symptoms: none  Follow-up CBG Time: 0200 CBG Result: 98   Possible Reasons for Event: dialysis pt.  Comments/MD notified: DR Hilbert Bible     Evonnie Dawes

## 2018-11-15 NOTE — Progress Notes (Signed)
PROGRESS NOTE    Courtney [name]  [id number] DOB: 12/03/1940 DOA: 11/20/2018 PCP: Neale Burly, MD   Brief Narrative:   Courtney [name] is a 78 y.o. female with medical history significant for ESRD on HD TTS, chronic hypotension on midodrine, severe protein-calorie malnutrition, prior CVA, type 2 diabetes, CAD/MI, dyslipidemia, and chronic diastolic CHF, who was brought to [facility] on 11/09/2018  from her hemodialysis facility due to severe hypotension.  Her major complaints were weakness and fatigue.  Apparently she had missed her last 2 HD appointments.  She also reported right foot chronic ulcer.  Upon arrival to the ED, patient noted to be significantly hypotensive as well as hypothermic.  Initial blood pressure was 65/49 and temperature was 94.8.  She was given 2.5 L of fluid and Zosyn.  Chest x-ray showed pulmonary vascular congestion and bilateral pleural effusion.  Due to the need of nephrology consult and hemodialysis, patient was transferred to [facility].    According to her son at bedside, she is only had some random aches that have been bothering her for the last several weeks, but aside from this she apparently did not complain of any other symptoms such as fever, chills, cough, shortness of breath, chest pain, abdominal pain, nausea, or vomiting.  Son is the primary historian at bedside as patient appears quite weak and somnolent.  She also reports that she is wheelchair-bound and confused at baseline.  She also had 1 week history of diarrhea and decreased oral intake.  Before transferring to [facility], she had CVC right IJ on 917 at [facility].  She was also suspected to be having severe sepsis which was also thought to be because of her hypotension.  She was started on Zosyn and vancomycin which were subsequently switched to ceftriaxone on 11/11/2018.  Currently, she also received Levophed in the ICU for possible septic versus hypovolemic shock.  She was also encephalopathic.  Nephrology was consulted and  she had 1 session of dialysis on 11/12/2018.  She was subsequently transferred under [facility] care on 11/13/2018.  Family including daughter and son had a long discussion with palliative care.  Reportedly, daughter is interested in comfort care however son wants to pursue different route.   Assessment & Plan:   Active Problems:   Hypotension   Sepsis ([place])   ESRD on hemodialysis ([place])   General weakness  Hypotension in the setting of septic vs bulimic shock/chronic hypotension on midodrine/gram-negative bacteremia.:  Off of Levophed.  Blood culture from 11/14/2018 is now growing Proteus.  Presumed source urine however urine culture is negative so far.  Blood pressure much better.  Continue Rocephin.  Acute metabolic vs uremic encephalopathy: Has dementia and chronically confused.  She is pleasant but lethargic and oriented x2.  This is her baseline.  Treat underlying cause.  Non-gap metabolic acidosis/lactic acidosis/ESRD on HD TTS: Resolved.  Nephrology on board.   Further management per nephrology.  Anemia of chronic disease: [place]in stable.  Monitor daily.  Transfuse if hemoglobin drops < 7.   Type 2 diabetes mellitus: Continues to have persistent hypoglycemia despite of switching to 10% dextrose however it is slightly better.  Currently I will increase the rate of fluid 200 cc/h.  GERD: PPI  Goal of care: Son and daughter had a long meeting with palliative care.  Daughter wants to pursue comfort care however son does not agree with that.  They are going to have meeting with nephrology today to here about their perspective about her  prognosis.  DVT prophylaxis: Heparin Code Status: Full code Family Communication:  None present at bedside.  Family had meeting with palliative care. Disposition Plan: TBD   Consultants:   Nephrology  PCCM  Procedures:   Right CV C IJ 11/12/2018  Antimicrobials:   IV Zosyn and vancomycin 11/17/2018> 11/11/2018  IV Rocephin  11/11/2018   Subjective: Patient seen and examined.  No complaints.  She is alert and oriented x2.  Objective: Vitals:   11/15/18 0400 11/15/18 0415 11/15/18 0755 11/15/18 0800  BP: 124/83   (!) 130/58  Pulse:      Resp: 14   15  Temp:  (!) 97.5 F (36.4 C) (!) 94.5 F (34.7 C)   TempSrc:  Oral Axillary   SpO2: 98%   99%  Weight:  40.6 kg    Height:        Intake/Output Summary (Last 24 hours) at 11/15/2018 0832 Last data filed at 11/15/2018 0400 Gross per 24 hour  Intake 1121.43 ml  Output 1500 ml  Net -378.57 ml   Filed Weights   11/14/18 1346 11/14/18 1646 11/15/18 0415  Weight: 41.5 kg 40 kg 40.6 kg    Examination:  General exam: Appears calm and comfortable, short stature Respiratory system: Clear to auscultation. Respiratory effort normal. Cardiovascular system: S1 & S2 heard, RRR. No JVD, murmurs, rubs, gallops or clicks.  +1 pitting edema bilateral upper extremity. Gastrointestinal system: Abdomen is nondistended, soft and nontender. No organomegaly or masses felt. Normal bowel sounds heard. Central nervous system: Alert and oriented x2.  Hypertonic and strictures in bilateral lower extremity.  Weakness in right upper extremity. Extremities: Symmetric 5 x 5 power. Skin: No rashes, lesions or ulcers.  Psychiatry: Judgement and insight appear normal. Mood & affect appropriate.   Data Reviewed: I have personally reviewed following labs and imaging studies  CBC: Recent Labs  Lab 10/27/2018 1304 11/11/18 0354 11/12/18 1348 11/13/18 0426  WBC 8.5 9.8 9.0 6.3  NEUTROABS 6.6  --   --   --   HGB 11.8* 8.5* 9.1* 7.5*  HCT 38.7 27.1* 27.8* 23.1*  MCV 93.0 90.9 88.3 89.5  PLT 155 122* 113* 81*   Basic Metabolic Panel: Recent Labs  Lab 11/12/2018 1304 11/11/18 0354 11/12/18 1348 11/13/18 0426  NA 137 138 136 135  K 4.4 3.9 3.6 3.8  CL 105 107 106 106  CO2 21* 21* 18* 23  GLUCOSE 178* 101* 120* 85  BUN 35* 35* 40* 20  CREATININE 5.53* 5.35* 5.79* 3.33*   CALCIUM 7.2* 7.2* 7.3* 7.1*  MG  --  1.8  --  1.7  PHOS  --  3.1 2.7 2.1*   GFR: Estimated Creatinine Clearance: 9.1 mL/min (A) (by C-G formula based on SCr of 3.33 mg/dL (H)). Liver Function Tests: Recent Labs  Lab 11/07/2018 1304 11/12/18 1348  AST 22  --   ALT 14  --   ALKPHOS 65  --   BILITOT 0.2*  --   PROT 5.0*  --   ALBUMIN 1.6* 1.3*   No results for input(s): LIPASE, AMYLASE in the last 168 hours. No results for input(s): AMMONIA in the last 168 hours. Coagulation Profile: Recent Labs  Lab 11/16/2018 1304  INR 1.1   Cardiac Enzymes: No results for input(s): CKTOTAL, CKMB, CKMBINDEX, TROPONINI in the last 168 hours. BNP (last 3 results) No results for input(s): PROBNP in the last 8760 hours. HbA1C: No results for input(s): HGBA1C in the last 72 hours. CBG: Recent Labs  Lab  11/15/18 0402 11/15/18 [id number] 11/15/18 [id number] 11/15/18 0656 11/15/18 0718  GLUCAP 147* 67* 62* 68* 85   Lipid Profile: No results for input(s): CHOL, HDL, LDLCALC, TRIG, CHOLHDL, LDLDIRECT in the last 72 hours. Thyroid Function Tests: No results for input(s): TSH, T4TOTAL, FREET4, T3FREE, THYROIDAB in the last 72 hours. Anemia Panel: No results for input(s): VITAMINB12, FOLATE, FERRITIN, TIBC, IRON, RETICCTPCT in the last 72 hours. Sepsis Labs: Recent Labs  Lab 11/05/2018 1304 11/23/2018 1514 11/13/2018 1522 11/11/18 0354  PROCALCITON  --   --  0.20  --   LATICACIDVEN 3.0* 2.5*  --  1.4    Recent Results (from the past 240 hour(s))  Culture, blood (Routine x 2)     Status: Abnormal   Collection Time: 11/06/2018  1:04 PM   Specimen: BLOOD  Result Value Ref Range Status   Specimen Description   Final    BLOOD LEFT ANTECUBITAL Performed at [facility], [address], [place], [place] [address]    Special Requests   Final    BOTTLES DRAWN AEROBIC AND ANAEROBIC Blood Culture adequate volume Performed at [place] [place], [place] [address]    Culture  Setup Time    Final    IN BOTH AEROBIC AND ANAEROBIC BOTTLES GRAM NEGATIVE RODS Gram Stain Report Called to,Read Back By and Verified With: A KOTEY,RN @0523  11/11/18 MKELLY CRITICAL RESULT CALLED TO, READ BACK BY AND VERIFIED WITH: T. BAUMEISTER, PHARMD AT 1000 ON 11/11/18 BY C. JESSUP, MT. Performed at [facility], [place] [address] [place], [place] [address]    Culture PROTEUS VULGARIS (A)  Final   Report Status 11/13/2018 FINAL  Final   Organism ID, Bacteria PROTEUS VULGARIS  Final      Susceptibility   Proteus vulgaris - MIC*    AMPICILLIN >=32 RESISTANT Resistant     CEFAZOLIN >=64 RESISTANT Resistant     CEFEPIME <=1 SENSITIVE Sensitive     CEFTAZIDIME <=1 SENSITIVE Sensitive     CEFTRIAXONE <=1 SENSITIVE Sensitive     CIPROFLOXACIN <=0.25 SENSITIVE Sensitive     GENTAMICIN <=1 SENSITIVE Sensitive     IMIPENEM 1 SENSITIVE Sensitive     TRIMETH/SULFA <=20 SENSITIVE Sensitive     AMPICILLIN/SULBACTAM 8 SENSITIVE Sensitive     PIP/TAZO <=4 SENSITIVE Sensitive     * PROTEUS VULGARIS  Blood Culture ID Panel (Reflexed)     Status: Abnormal   Collection Time:   1:04 PM  Result Value Ref Range Status   Enterococcus species NOT DETECTED NOT DETECTED Final   Listeria monocytogenes NOT DETECTED NOT DETECTED Final   Staphylococcus species NOT DETECTED NOT DETECTED Final   Staphylococcus aureus (BCID) NOT DETECTED NOT DETECTED Final   Streptococcus species NOT DETECTED NOT DETECTED Final   Streptococcus agalactiae NOT DETECTED NOT DETECTED Final   Streptococcus pneumoniae NOT DETECTED NOT DETECTED Final   Streptococcus pyogenes NOT DETECTED NOT DETECTED Final   Acinetobacter baumannii NOT DETECTED NOT DETECTED Final   Enterobacteriaceae species DETECTED (A) NOT DETECTED Final    Comment: Enterobacteriaceae represent a large family of gram-negative bacteria, not a single organism. CRITICAL RESULT CALLED TO, READ BACK BY AND VERIFIED WITH: T. BAUMEISTER, PHARMD AT 1000 ON 11/11/18 BY C.  JESSUP, MT.    Enterobacter cloacae complex NOT DETECTED NOT DETECTED Final   Escherichia coli NOT DETECTED NOT DETECTED Final   Klebsiella oxytoca NOT DETECTED NOT DETECTED Final   Klebsiella pneumoniae NOT DETECTED NOT DETECTED Final   Proteus species  DETECTED (A) NOT DETECTED Final    Comment: CRITICAL RESULT CALLED TO, READ BACK BY AND VERIFIED WITH: T. BAUMEISTER, PHARMD AT 1000 ON 11/11/18 BY C. JESSUP, MT.    Serratia marcescens NOT DETECTED NOT DETECTED Final   Carbapenem resistance NOT DETECTED NOT DETECTED Final   Haemophilus influenzae NOT DETECTED NOT DETECTED Final   Neisseria meningitidis NOT DETECTED NOT DETECTED Final   Pseudomonas aeruginosa NOT DETECTED NOT DETECTED Final   Candida albicans NOT DETECTED NOT DETECTED Final   Candida glabrata NOT DETECTED NOT DETECTED Final   Candida krusei NOT DETECTED NOT DETECTED Final   Candida parapsilosis NOT DETECTED NOT DETECTED Final   Candida tropicalis NOT DETECTED NOT DETECTED Final    Comment: Performed at [facility], [place] [address] [place], [place] [address]  SARS Coronavirus 2 [facility] order, Performed in [facility] hospital lab) Nasopharyngeal Nasopharyngeal Swab     Status: None   Collection Time:   3:12 PM   Specimen: Nasopharyngeal Swab  Result Value Ref Range Status   SARS Coronavirus 2 NEGATIVE NEGATIVE Final    Comment: (NOTE) If result is NEGATIVE SARS-CoV-2 target nucleic acids are NOT DETECTED. The SARS-CoV-2 RNA is generally detectable in upper and lower  respiratory specimens during the acute phase of infection. The lowest  concentration of SARS-CoV-2 viral copies this assay can detect is 250  copies / mL. A negative result does not preclude SARS-CoV-2 infection  and should not be used as the sole basis for treatment or other  patient management decisions.  A negative result may occur with  improper specimen collection / handling, submission of specimen other  than nasopharyngeal swab,  presence of viral mutation(s) within the  areas targeted by this assay, and inadequate number of viral copies  (<250 copies / mL). A negative result must be combined with clinical  observations, patient history, and epidemiological information. If result is POSITIVE SARS-CoV-2 target nucleic acids are DETECTED. The SARS-CoV-2 RNA is generally detectable in upper and lower  respiratory specimens dur ing the acute phase of infection.  Positive  results are indicative of active infection with SARS-CoV-2.  Clinical  correlation with patient history and other diagnostic information is  necessary to determine patient infection status.  Positive results do  not rule out bacterial infection or co-infection with other viruses. If result is PRESUMPTIVE POSTIVE SARS-CoV-2 nucleic acids MAY BE PRESENT.   A presumptive positive result was obtained on the submitted specimen  and confirmed on repeat testing.  While 2019 novel coronavirus  (SARS-CoV-2) nucleic acids may be present in the submitted sample  additional confirmatory testing may be necessary for epidemiological  and / or clinical management purposes  to differentiate between  SARS-CoV-2 and other Sarbecovirus currently known to infect humans.  If clinically indicated additional testing with an alternate test  methodology [phone]) is advised. The SARS-CoV-2 RNA is generally  detectable in upper and lower respiratory sp ecimens during the acute  phase of infection. The expected result is Negative. Fact Sheet for Patients:  StrictlyIdeas.no Fact Sheet for Healthcare Providers: BankingDealers.co.za This test is not yet approved or cleared by the [place] FDA and has been authorized for detection and/or diagnosis of SARS-CoV-2 by FDA under an Emergency Use Authorization (EUA).  This EUA will remain in effect (meaning this test can be used) for the duration of the COVID-19 declaration under  Section 564(b)(1) of the Act, 21 U.S.C. section 360bbb-3(b)(1), unless the authorization is terminated or revoked sooner. Performed at  [facility], [address], [place], [place] [address]   Culture, blood (Routine x 2)     Status: None   Collection Time: 11/13/2018  3:14 PM   Specimen: Left Antecubital; Blood  Result Value Ref Range Status   Specimen Description LEFT ANTECUBITAL  Final   Special Requests   Final    BOTTLES DRAWN AEROBIC ONLY Blood Culture adequate volume   Culture   Final    NO GROWTH 5 DAYS Performed at [facility], [address], [place], [place] [address]    Report Status 11/15/2018 FINAL  Final  MRSA PCR Screening     Status: None   Collection Time: 11/22/2018 10:50 PM   Specimen: Nasopharyngeal  Result Value Ref Range Status   MRSA by PCR NEGATIVE NEGATIVE Final    Comment:        The GeneXpert MRSA Assay (FDA approved for NASAL specimens only), is one component of a comprehensive MRSA colonization surveillance program. It is not intended to diagnose MRSA infection nor to guide or monitor treatment for MRSA infections. Performed at [facility], [place] [address] [place], [place] [address]   C difficile quick scan w PCR reflex     Status: None   Collection Time: 11/15/2018 10:52 PM   Specimen: STOOL  Result Value Ref Range Status   C Diff antigen NEGATIVE NEGATIVE Final   C Diff toxin NEGATIVE NEGATIVE Final   C Diff interpretation No C. difficile detected.  Final    Comment: Performed at [facility], [place] [address], [place], [place] [address]  Urine Culture     Status: Abnormal   Collection Time: 11/11/18  8:33 PM   Specimen: Urine, Random  Result Value Ref Range Status   Specimen Description URINE, RANDOM  Final   Special Requests   Final    NONE Performed at [facility], [place] [address] [place],  [address]    Culture (A)  Final    >=100,000 COLONIES/mL STAPHYLOCOCCUS SPECIES (COAGULASE NEGATIVE)   Report Status  11/14/2018 FINAL  Final   Organism ID, Bacteria STAPHYLOCOCCUS SPECIES (COAGULASE NEGATIVE) (A)  Final      Susceptibility   Staphylococcus species (coagulase negative) - MIC*    CIPROFLOXACIN >=8 RESISTANT Resistant     GENTAMICIN <=0.5 SENSITIVE Sensitive     NITROFURANTOIN <=16 SENSITIVE Sensitive     OXACILLIN >=4 RESISTANT Resistant     TETRACYCLINE <=1 SENSITIVE Sensitive     VANCOMYCIN <=0.5 SENSITIVE Sensitive     TRIMETH/SULFA <=10 SENSITIVE Sensitive     CLINDAMYCIN <=0.25 SENSITIVE Sensitive     RIFAMPIN <=0.5 SENSITIVE Sensitive     Inducible Clindamycin NEGATIVE Sensitive     * >=100,000 COLONIES/mL STAPHYLOCOCCUS SPECIES (COAGULASE NEGATIVE)      Radiology Studies: No results found.  Scheduled Meds:  aspirin EC  81 mg Oral Daily   atorvastatin  20 mg Oral Daily   Chlorhexidine Gluconate Cloth  6 each Topical Daily   Chlorhexidine Gluconate Cloth  6 each Topical Q0600   escitalopram  5 mg Oral Daily   feeding supplement (PRO-STAT SUGAR FREE 64)  30 mL Oral TID WC   fludrocortisone  0.1 mg Oral Daily   heparin  5,000 Units Subcutaneous Q8H   insulin aspart  2-6 Units Subcutaneous Q4H   lipase/protease/amylase  12,000 Units Oral TID AC   midodrine  10 mg Oral TID WC   multivitamin  1 tablet Oral QHS   pantoprazole  40 mg Oral BID   sodium chloride flush  10-40 mL Intracatheter Q12H   Continuous Infusions:  sodium chloride     sodium chloride     cefTRIAXone (ROCEPHIN)  IV Stopped (11/14/18 1001)   dextrose 75 mL/hr at 11/15/18 0400     LOS: 5 days   Time spent: 27 minutes   Darliss Cheney, MD [facility] Pager [phone]  If 7PM-7AM, please contact night-coverage [email or web address] Password [facility] 11/15/2018, 8:32 AM

## 2018-11-15 NOTE — Consult Note (Signed)
Consultation Note Date: 11/15/2018   Patient Name: Courtney [name]  DOB: [birth year]  MRN: [id number]  Age / Sex: 78 y.o., female   PCP: Neale Burly, MD Referring Physician: Darliss Cheney, MD   REASON FOR CONSULTATION:Establishing goals of care  Palliative Care consult requested for this 78 y.o. female with multiple medical problems including ESRD on HD TTS, chronic hypotension (midodrine), severe protein-calorie malnutrition, CVA with right-sided weakness, type 2 diabetes, CAD/MI, hyperlipidemia, right foot ulcer, and chronic diastolic CHF. Patient presented to [facility] ED from her dialysis center due to severe hypotension. It was reported that she missed her previous 2 HD appointments. CXR showed pulmonary vascular congestion and bilateral pleural effusions. BP 65/49. Patient was given 2.5 L IV fluid resuscitation and started on Zosyn. Patient was transferred to [name] for HD. Right foot x-ray showed Diffuse osteopenia limits evaluation of the osseous structures.Question of a nondisplaced fracture seen at the base of the second proximal phalanx at the MTP joint. Sclerosis of the distal tuft of the first digit could be from chronic osteomyelitis or prior injury. Since her admission patient was initially treated with Levophed for pressure support but has since weaned off. She remains hypotensive on Midodrine. Nephrology is following however patient continues to have difficulty with tolerating HD due to hypotension. Palliative Medicine team consulted for goals of care.   Clinical Assessment and Goals of Care: I have reviewed medical records including lab results, imaging, Epic notes, and MAR, received report from the bedside RN, and assessed the patient. I met at the bedside with [name]) and [name]) to discuss diagnosis prognosis, GOC, EOL wishes, disposition and options. Patient is somewhat lethargic. Oriented to herself and family only.   I introduced  Palliative Medicine as specialized medical care for people living with serious illness. It focuses on providing relief from the symptoms and stress of a serious illness. The goal is to improve quality of life for both the patient and the family. Family verbalized understanding and appreciation in our involvement.   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   We discussed a brief life review of the patient, along with functional and nutritional status. Patient resides in the home primarily with her son, however her daughter cares for her on her days off from HD at her home and every other weekend. She has 2 daughters.   Prior to admission family reports patient was having some difficulty with HD due to hypotension. Son reports HD staff continued to express their main focus should be to increase her nutritional state and protein. They report patient was alert and would hold appropriate conversations with some confusion at times. She has not ambulated in over 1.5 years due to arthritis and her right sided weakness from her CVA. Her appetite has waxed and wane over the past 6-8 weeks and patient required someone to feed her. Son takes her to HD appointments.   We discussed Her current illness and what it means in the larger context of Her on-going co-morbidities. With specific discussions regarding her intolerance of HD due to hypotension, chronic hypotension, CHF, sepsis, and her overall decline in functional and nutritional state.  Natural disease trajectory and expectations at EOL were discussed.   We discussed at length patient's history with HD and recent concerns with her poor tolerance both while hospitalized and as expressed by her children. Daughter is tearful in expressing her mother's decline in health over the past months. Son  states "you can be sad but I feel like her good days are more in count than her bad days and I am not going to go by what y'all see right now!" I used this opportunity to discuss  with son his previously expressed concerns and awareness of his mother's decline and intolerance to HD with awareness of her missing sessions and his expressed statement of "if she did have treatments they were unable to complete the entire course!" Son verbalized his awareness and continued to state, "I will not give up on my mom. She is a Control and instrumentation engineer and therapeutic listening provided.   Daughter turned and looked at her brother and began explaining to him how her mother was doing poorly and that remaining hopeful is ok but they must also examine closely the alternatives if she does not improve. Courtney [name] stood up and began looking out of the window. Silence allowed. He began crying. Emotional support provided to children.   Daughter requested to continue with discussion after a few minutes asking to review patient's overall condition and medical concerns. I again reviewed concerns regarding her hypotension, poor tolerance to HD, poor nutritional and functional state, sepsis, and hypoglycemic episodes. Daughter tearful stating "I think she is getting tired and so is her body!" Support given with agreement to her statement.   I encouraged family to consider what and how her mother's quality of life is currently and when she was in an acceptable state of health and used that to assist with them navigating their decisions. Also explained what limitations would be placed on continued interventions to their mother versus care/interventions for her to be comfortable. Daughter verbalized appreciation.   Daughter stated she would not want to continue putting her mother through interventions and even HD knowing she is not getting better and could potentially making her suffer more, however she wants to be at peace with their decisions and also for her brother to be there emotionally.   Courtney [name] stated he doesn't want her to suffer but he isn't quite ready to "just give up either". Family is requesting to speak  with Nephrology for direct follow-up and their opinion on how patient is tolerating or not tolerating HD to assist with their decision making.   I attempted to elicit values and goals of care important to the patient.    The difference between aggressive medical intervention and comfort care was considered in light of the patient's goals of care. Daughter asked appropriate questions also requesting to discuss options. We if patient becomes unable to continue HD or if family decided to discontinue HD what this would like for patient and family. We also discussed continued aggressive interventions and the challenges and prolonged suffering that patient could potentially and most likely endure with specifics given to rehospitalizations, testing, and further decline.   Both mutually agreed they would not want to place a PEG tube as their mother often will pull out tubes such as the NG when she had one placed on multiple occassions. Daughter reports she knows that her mother would most likely pull this out in the home and she does not want to go through that. Courtney [name] also agreed. They are aware their are no long-term options for nutritional support outside of her increasing her intake which continues to be a struggle.   Daughter asking to discuss hospice options and prognosis. We discussed at length patient's poor prognosis, and hospice options home with support versus residential hospice facility. Daughter states they live in  [place] and [place] would be their preferred location if they came to this agreement. Stating they also had a family member who worked there. Daughter states this would be the only facility they would ever send her to as they would not consider a SNF for her care. I also discussed if patient was to further decline and/or family chose to transition to a more comfort approach what care would look like while hospitalized as well anticipated hospital death in the event she was  not appropriate to discharge from the hospital. Family verbalized understanding.   Daughter requested information on hospice for them to continue reviewing and discussing amongst themselves. Information provided (Hard Choices Book and printed Hospice/Palliative materials).   Family reports their mother does have an Advanced directives which does identify both children as her 41. We discussed in detail her full code status with consideration to her current illness and co-morbidities. Children both states her wishes were to perform heroic measures, however she did not want to be on long-term life-support. Daughter reports if "she was to code and the medical team stated she would not be able to come off or she is in a vegetative state/coma we would then remove her and allow her die." I discussed at length what a potential code would like for patient in her current condition and her poor prognosis of survival. They both verbalized understanding. Daughter is tearful stating she does not know if they can make a decision at this time. She expressed she feels her mother should be a DNR watching all that she is going through and been through, sharing it is hard when you discuss the everyday scenarios with loved ones like if a tragedy happens what would you want but not discussing what wishes are in the event of chronic or terminal health issues that cause suffering and not a sudden risk to your life. Support given. Family requesting she remains a full code and allow them some time to discuss and make further decisions.    Questions and concerns were addressed. The family was encouraged to call with questions or concerns.  PMT will continue to support holistically.   SOCIAL HISTORY:     reports that she has never smoked. She has never used smokeless tobacco. She reports that she does not drink alcohol or use drugs.  CODE STATUS: Full code  ADVANCE DIRECTIVES: Next of Kin (2 children)   SYMPTOM MANAGEMENT:  per attending   Palliative Prophylaxis:   Aspiration, Bowel Regimen, Delirium Protocol, Eye Care, Frequent Pain Assessment, Oral Care, Palliative Wound Care and Turn Reposition  PSYCHO-SOCIAL/SPIRITUAL:  Support System: Family   Desire for further Chaplaincy support: NO   Additional Recommendations (Limitations, Scope, Preferences):  Full Scope Treatment, No Tracheostomy and No PEG   PAST MEDICAL HISTORY: Past Medical History:  Diagnosis Date   Anemia 07/2018   CHF (congestive heart failure) (HCC)    Coronary artery disease    Diabetes mellitus without complication ([place])    ESRD on dialysis ([place]) 02/10/2017   Hyperlipidemia    Hypertension    Myocardial infarct ([place])    Stroke ([place])    2-3 yrs ago- right sided weakness    PAST SURGICAL HISTORY:  Past Surgical History:  Procedure Laterality Date   APPENDECTOMY     AV FISTULA PLACEMENT Right 09/20/2015   Procedure: PLACEMENT OF RIGHT UPPER EXTREMITY ARTERIOVENOUS GORE-TEX GRAFT FOR HEMODIALYSIS ACCESS;  Surgeon: Vickie Epley, MD;  Location: [facility];  Service: Vascular;  Laterality:  Right;   CATARACT EXTRACTION W/PHACO Left 06/29/2013   Procedure: CATARACT EXTRACTION PHACO AND INTRAOCULAR LENS PLACEMENT (IOC);  Surgeon: Tonny [facility], MD;  Location: [facility];  Service: Ophthalmology;  Laterality: Left;  CDE 12.25   CATARACT EXTRACTION W/PHACO Right 07/27/2013   Procedure: CATARACT EXTRACTION PHACO AND INTRAOCULAR LENS PLACEMENT (IOC);  Surgeon: Tonny [facility], MD;  Location: [facility];  Service: Ophthalmology;  Laterality: Right;  CDE:7.72   COLONOSCOPY WITH PROPOFOL N/A 08/07/2018   Procedure: COLONOSCOPY WITH PROPOFOL;  Surgeon: Ronnette Juniper, MD;  Location: [place];  Service: Gastroenterology;  Laterality: N/A;   ESOPHAGOGASTRODUODENOSCOPY N/A 08/03/2018   Procedure: ESOPHAGOGASTRODUODENOSCOPY (EGD);  Surgeon: Clarene Essex, MD;  Location: [place];  Service: Endoscopy;  Laterality: N/A;   ESOPHAGOGASTRODUODENOSCOPY  (EGD) WITH PROPOFOL N/A 08/07/2018   Procedure: ESOPHAGOGASTRODUODENOSCOPY (EGD) WITH PROPOFOL;  Surgeon: Ronnette Juniper, MD;  Location: [facility];  Service: Gastroenterology;  Laterality: N/A;   GIVENS CAPSULE STUDY N/A 08/07/2018   Procedure: GIVENS CAPSULE STUDY;  Surgeon: Ronnette Juniper, MD;  Location: [facility];  Service: Gastroenterology;  Laterality: N/A;   HEMOSTASIS CLIP PLACEMENT  08/07/2018   Procedure: HEMOSTASIS CLIP PLACEMENT;  Surgeon: Ronnette Juniper, MD;  Location: [facility];  Service: Gastroenterology;;   HOT HEMOSTASIS N/A 08/03/2018   Procedure: HOT HEMOSTASIS (ARGON PLASMA COAGULATION/BICAP);  Surgeon: Clarene Essex, MD;  Location: [facility];  Service: Endoscopy;  Laterality: N/A;   INSERTION OF DIALYSIS CATHETER Right 09/11/2015   Procedure: INSERTION OF TUNNELED DIALYSIS CATHETER;  Surgeon: Vickie Epley, MD;  Location: [facility];  Service: Vascular;  Laterality: Right;   INTRAMEDULLARY (IM) NAIL INTERTROCHANTERIC Right 07/20/2012   Procedure: INTRAMEDULLARY (IM) NAIL INTERTROCHANTRIC;  Surgeon: Carole Civil, MD;  Location: [facility];  Service: Orthopedics;  Laterality: Right;   IR AV DIALY SHUNT INTRO NEEDLE/INTRAC INITIAL W/PTA/STENT/IMG RT Right 07/20/2018   IR GENERIC HISTORICAL  09/25/2015   IR US GUIDE VASC ACCESS RIGHT 09/25/2015 Sandi Mariscal, MD [facility]   IR GENERIC HISTORICAL  09/25/2015   IR FLUORO GUIDE CV LINE RIGHT 09/25/2015 Sandi Mariscal, MD [facility]   IR GENERIC HISTORICAL  09/25/2015   IR REMOVAL TUN CV CATH W/O FL 09/25/2015 Sandi Mariscal, MD [facility]   IR THROMBECTOMY AV FISTULA W/THROMBOLYSIS/PTA INC/SHUNT/IMG RIGHT Right 09/06/2017   IR THROMBECTOMY AV FISTULA W/THROMBOLYSIS/PTA INC/SHUNT/IMG RIGHT Right 09/29/2017   IR THROMBECTOMY AV FISTULA W/THROMBOLYSIS/PTA INC/SHUNT/IMG RIGHT Right 07/20/2018   IR US GUIDE VASC ACCESS RIGHT  09/06/2017   IR US GUIDE VASC ACCESS RIGHT  09/29/2017   IR US GUIDE VASC ACCESS RIGHT  07/20/2018   IR US GUIDE VASC  ACCESS RIGHT  07/20/2018   SCLEROTHERAPY  08/03/2018   Procedure: Clide Deutscher;  Surgeon: Clarene Essex, MD;  Location: [facility];  Service: Endoscopy;;    ALLERGIES:  is allergic to grapefruit flavor [flavoring agent].   MEDICATIONS:  Current Facility-Administered Medications  Medication Dose Route Frequency Provider Last Rate Last Dose   0.9 %  sodium chloride infusion  100 mL Intravenous PRN Madelon Lips, MD       0.9 %  sodium chloride infusion  100 mL Intravenous PRN Madelon Lips, MD       acetaminophen (TYLENOL) tablet 650 mg  650 mg Oral Q6H PRN Heath Lark D, DO   650 mg at 11/14/18 1705   Or   acetaminophen (TYLENOL) suppository 650 mg  650 mg Rectal Q6H PRN Manuella Ghazi, Pratik D, DO       alteplase (CATHFLO ACTIVASE) injection 2 mg  2  mg Intracatheter Once PRN Mauricia Area, MD       alteplase (CATHFLO ACTIVASE) injection 2 mg  2 mg Intracatheter Once PRN Mauricia Area, MD       aspirin EC tablet 81 mg  81 mg Oral Daily Manuella Ghazi, Pratik D, DO   81 mg at 11/15/18 0851   atorvastatin (LIPITOR) tablet 20 mg  20 mg Oral Daily Manuella Ghazi, Pratik D, DO   20 mg at 11/15/18 0851   cefTRIAXone (ROCEPHIN) 2 g in sodium chloride 0.9 % 100 mL IVPB  2 g Intravenous Q24H Kipp Brood, MD   Stopped at 11/14/18 1001   Chlorhexidine Gluconate Cloth 2 % PADS 6 each  6 each Topical Daily Collene Gobble, MD   6 each at 11/14/18 0926   Chlorhexidine Gluconate Cloth 2 % PADS 6 each  6 each Topical Q0600 Madelon Lips, MD   6 each at 11/15/18 0610   dextrose 10 % infusion   Intravenous Continuous Darliss Cheney, MD 100 mL/hr at 11/15/18 0839     escitalopram (LEXAPRO) tablet 5 mg  5 mg Oral Daily Manuella Ghazi, Pratik D, DO   5 mg at 11/15/18 4650   feeding supplement (PRO-STAT SUGAR FREE 64) liquid 30 mL  30 mL Oral TID WC Shah, Pratik D, DO   30 mL at 11/15/18 0840   fludrocortisone (FLORINEF) tablet 0.1 mg  0.1 mg Oral Daily Agarwala, Einar Grad, MD   0.1 mg at 11/15/18 3546   heparin injection  1,000 Units  1,000 Units Dialysis PRN Madelon Lips, MD       heparin injection 5,000 Units  5,000 Units Subcutaneous Q8H Manuella Ghazi, Pratik D, DO   5,000 Units at 11/15/18 5681   insulin aspart (novoLOG) injection 2-6 Units  2-6 Units Subcutaneous Q4H Omar Person, NP   2 Units at 11/15/18 0406   lidocaine (PF) (XYLOCAINE) 1 % injection 5 mL  5 mL Intradermal PRN Madelon Lips, MD       lidocaine-prilocaine (EMLA) cream 1 application  1 application Topical PRN Madelon Lips, MD       lipase/protease/amylase (CREON) capsule 12,000 Units  12,000 Units Oral TID AC Collene Gobble, MD   12,000 Units at 11/15/18 0853   midodrine (PROAMATINE) tablet 10 mg  10 mg Oral TID WC Manuella Ghazi, Pratik D, DO   10 mg at 11/15/18 2751   multivitamin (RENA-VIT) tablet 1 tablet  1 tablet Oral QHS Manuella Ghazi, Pratik D, DO   1 tablet at 11/14/18 2157   ondansetron (ZOFRAN) tablet 4 mg  4 mg Oral Q6H PRN Manuella Ghazi, Pratik D, DO       Or   ondansetron (ZOFRAN) injection 4 mg  4 mg Intravenous Q6H PRN Manuella Ghazi, Pratik D, DO       pantoprazole (PROTONIX) EC tablet 40 mg  40 mg Oral BID Manuella Ghazi, Pratik D, DO   40 mg at 11/15/18 7001   pentafluoroprop-tetrafluoroeth (GEBAUERS) aerosol 1 application  1 application Topical PRN Madelon Lips, MD       sodium chloride flush (NS) 0.9 % injection 10-40 mL  10-40 mL Intracatheter Q12H Shellia Cleverly, MD   10 mL at 11/15/18 0853   sodium chloride flush (NS) 0.9 % injection 10-40 mL  10-40 mL Intracatheter PRN Shellia Cleverly, MD       traMADol Veatrice Bourbon) tablet 50 mg  50 mg Oral Q6H PRN Omar Person, NP   50 mg at 11/12/18 1842    VITAL SIGNS: BP (!) 130/58 (BP Location:  Left Arm)    Pulse 76    Temp (!) 94.5 F (34.7 C) (Axillary)    Resp 15    Ht _0  (1.499 m)    Wt 40.6 kg    SpO2 99%    BMI 18.08 kg/m  Filed Weights   11/14/18 1346 11/14/18 1646 11/15/18 0415  Weight: 41.5 kg 40 kg 40.6 kg    Estimated body mass index is 18.08 kg/m as calculated from the  following:   Height as of this encounter: _1  (1.499 m).   Weight as of this encounter: 40.6 kg.  LABS: CBC:    Component Value Date/Time   WBC 6.3 11/13/2018 0426   HGB 7.5 (L) 11/13/2018 0426   HCT 23.1 (L) 11/13/2018 0426   PLT 81 (L) 11/13/2018 0426   Comprehensive Metabolic Panel:    Component Value Date/Time   NA 135 11/13/2018 0426   K 3.8 11/13/2018 0426   CO2 23 11/13/2018 0426   BUN 20 11/13/2018 0426   CREATININE 3.33 (H) 11/13/2018 0426   CREATININE 3.15 (H) 02/10/2017 1139   ALBUMIN 1.3 (L) 11/12/2018 1348     Review of Systems  Unable to perform ROS: Acuity of condition     Physical Exam General: NAD, frail chronically-ill appearing, thin Cardiovascular: regular rate and rhythm Pulmonary: clear ant fields bilaterally  Abdomen: soft, nontender, + bowel sounds Extremities: bilateral upper extremity edema Skin: no rashes, warm, dry, heel ulceration Neurological: alert to self and family only, right sided weakness r/t previous CVA   Prognosis: Unable to determine (Guarded to Poor)   Discharge Planning:  To Be Determined  Recommendations:  Full Code-as requested by family   Continue current full aggressive plan of care per attending as requested by family   Family requesting to speak with Nephrologist for further updates on patient's tolerance vs. Intolerance to HD to assist with decision making. Dr. Augustin Coupe is aware.   Family remains hopeful but is also preparing for the worst (no improvement/further decline).   PMT will continue to support and follow for continued [place] discussions.    Palliative Performance Scale: PPS 20%               [name] and Courtney [name] expressed understanding and was in agreement with this plan.   Thank you for allowing the Palliative Medicine Team to assist in the care of this patient.  Time In: 1430 Time Out: 1545 Time Total: 75 min.   Visit consisted of counseling and education dealing with the complex and  emotionally intense issues of symptom management and palliative care in the setting of serious and potentially life-threatening illness.Greater than 50%  of this time was spent counseling and coordinating care related to the above assessment and plan.  Signed by:  Alda Lea, AGPCNP-BC Palliative Medicine Team  Phone: [phone] Fax: [phone] Pager: [phone] Amion: Bjorn Pippin

## 2018-11-15 NOTE — Progress Notes (Signed)
Unable to get Pt's temp above 36.5. Pahwani MD notified ordered placed to apply bear hugger. Will continue to monitor.

## 2018-11-15 NOTE — Progress Notes (Signed)
Initial Nutrition Assessment  DOCUMENTATION CODES:   Underweight, suspect malnutrition but unable to confirm without NFPE  INTERVENTION:   - RD will continue to follow for discussions regarding GOC  - Liberalize diet from Heart Healthy/Carb Modified diet to Regular diet, verbal with readback order placed per Dr. Doristine Bosworth  - Boost Breeze po TID, each supplement provides 250 kcal and 9 grams of protein  - Continue Pro-stat 30 ml TID, each supplement provides 100 kcal and 15 grams of protein  - Continue rena-vit daily  NUTRITION DIAGNOSIS:   Increased nutrient needs related to wound healing, chronic illness (ESRD on HD, CHF) as evidenced by estimated needs.  GOAL:   Patient will meet greater than or equal to 90% of their needs  MONITOR:   PO intake, Supplement acceptance, Weight trends, Skin, I & O's, Labs  REASON FOR ASSESSMENT:   Other (low BMI)    ASSESSMENT:   78 year old female who presented to the ED on 9/17 with hypotension. PMH of ESRD on HD, chronic hypotension on midodrine, severe protein-calorie malnutrition, prior CVA, T2DM, CAD/MI, dyslipidemia, CHF.   9/19 - iHD 9/21 - iHD  Discussed pt with RN and during ICU rounds. Per RN, pt talking today and did eat some this morning.  Palliative team is involved for discussions regarding [place]. Reviewed note from 9/21. Plan is for full scope of treatment but no tracheostomy or feeding tube. Next step is for family to meet with Nephrology.  Unable to reach pt via phone call today.  Reviewed RD note from previous admission on 09/02/18. Per RD note, pt does not like Nepro shakes. RD will continue order for Pro-stat and add Boost Breeze supplements. Will also continue with rena-vit daily.  Spoke with MD who approved liberalizing pt's diet to Regular. Verbal with readback order placed.  Reviewed pt's weight history. Current weight of 40.6 kg is above lowest weight of 31.1 kg on 09/03/18. Pt weighed 35.9 kg on admit. Pt with  moderate pitting generalized edema and moderate pitting edema to BUE and BLE. Suspect elevated weight is related to fluid status and not true weight gain. Difficult to determine pt's current dry weight. EDW at hemodialysis is 32.5 kg.  Pt with a history of severe protein-calorie malnutrition. Suspect malnutrition persists but RD unable to confirm at this time without NFPE.  Meal Completion: 10% x 1 recorded meal  Medications reviewed and include: Pro-stat 30 ml TID, rena-vit, Protonix, IV abx IVF: D10 @ 100 ml/hr  Labs reviewed: phosphorus 2.1, hemoglobin 7.5 CBG's: 62-147 x 12 hours  HD net UF 9/21: 1500 ml  NUTRITION - FOCUSED PHYSICAL EXAM:  Unable to complete at this time. Pt is on enteric precautions.  Diet Order:   Diet Order            Diet regular Room service appropriate? Yes; Fluid consistency: Thin  Diet effective now              EDUCATION NEEDS:   Not appropriate for education at this time  Skin:  Skin Assessment: Skin Integrity Issues: Skin Integrity Issues: DTI: left heel Stage II: right sacrum Unstageable: right foot  Last BM:  11/14/18  Height:   Ht Readings from Last 1 Encounters:  11/17/2018 4\' 11"  (1.499 m)    Weight:   Wt Readings from Last 1 Encounters:  11/15/18 40.6 kg    Ideal Body Weight:  43.2 kg  BMI:  Body mass index is 18.08 kg/m.  Estimated Nutritional Needs:   Kcal:  [id number]  Protein:  55-70 grams  Fluid:  UOP + 1000 ml    Gaynell Face, MS, RD, LDN Inpatient Clinical Dietitian Pager: [phone] Weekend/After Hours: [phone]

## 2018-11-16 LAB — CBC
HCT: 18.9 % — ABNORMAL LOW (ref 36.0–46.0)
HCT: 19.2 % — ABNORMAL LOW (ref 36.0–46.0)
Hemoglobin: 6.3 g/dL — CL (ref 12.0–15.0)
Hemoglobin: 6.5 g/dL — CL (ref 12.0–15.0)
MCH: 29.3 pg (ref 26.0–34.0)
MCH: 29.5 pg (ref 26.0–34.0)
MCHC: 33.3 g/dL (ref 30.0–36.0)
MCHC: 33.9 g/dL (ref 30.0–36.0)
MCV: 87.9 fL (ref 80.0–100.0)
MCV: 87.3 fL (ref 80.0–100.0)
Platelets: 67 10*3/uL — ABNORMAL LOW (ref 150–400)
Platelets: 71 10*3/uL — ABNORMAL LOW (ref 150–400)
RBC: 2.15 MIL/uL — ABNORMAL LOW (ref 3.87–5.11)
RBC: 2.2 MIL/uL — ABNORMAL LOW (ref 3.87–5.11)
RDW: 20.9 % — ABNORMAL HIGH (ref 11.5–15.5)
RDW: 21.1 % — ABNORMAL HIGH (ref 11.5–15.5)
WBC: 5.4 10*3/uL (ref 4.0–10.5)
WBC: 5.9 10*3/uL (ref 4.0–10.5)
nRBC: 0 % (ref 0.0–0.2)
nRBC: 0 % (ref 0.0–0.2)

## 2018-11-16 LAB — CBC WITH DIFFERENTIAL/PLATELET
Abs Immature Granulocytes: 0.03 10*3/uL (ref 0.00–0.07)
Basophils Absolute: 0 10*3/uL (ref 0.0–0.1)
Basophils Relative: 0 %
Eosinophils Absolute: 0 10*3/uL (ref 0.0–0.5)
Eosinophils Relative: 0 %
HCT: 20.2 % — ABNORMAL LOW (ref 36.0–46.0)
Hemoglobin: 6.7 g/dL — CL (ref 12.0–15.0)
Immature Granulocytes: 1 %
Lymphocytes Relative: 8 %
Lymphs Abs: 0.5 10*3/uL — ABNORMAL LOW (ref 0.7–4.0)
MCH: 29 pg (ref 26.0–34.0)
MCHC: 33.2 g/dL (ref 30.0–36.0)
MCV: 87.4 fL (ref 80.0–100.0)
Monocytes Absolute: 0.4 10*3/uL (ref 0.1–1.0)
Monocytes Relative: 7 %
Neutro Abs: 5 10*3/uL (ref 1.7–7.7)
Neutrophils Relative %: 84 %
Platelets: 76 10*3/uL — ABNORMAL LOW (ref 150–400)
RBC: 2.31 MIL/uL — ABNORMAL LOW (ref 3.87–5.11)
RDW: 20.9 % — ABNORMAL HIGH (ref 11.5–15.5)
WBC: 5.9 10*3/uL (ref 4.0–10.5)
nRBC: 0 % (ref 0.0–0.2)

## 2018-11-16 LAB — BASIC METABOLIC PANEL
Anion gap: 10 (ref 5–15)
Anion gap: 9 (ref 5–15)
BUN: 18 mg/dL (ref 8–23)
BUN: 18 mg/dL (ref 8–23)
CO2: 23 mmol/L (ref 22–32)
CO2: 22 mmol/L (ref 22–32)
Calcium: 7 mg/dL — ABNORMAL LOW (ref 8.9–10.3)
Calcium: 6.9 mg/dL — ABNORMAL LOW (ref 8.9–10.3)
Chloride: 90 mmol/L — ABNORMAL LOW (ref 98–111)
Chloride: 91 mmol/L — ABNORMAL LOW (ref 98–111)
Creatinine, Ser: 2.85 mg/dL — ABNORMAL HIGH (ref 0.44–1.00)
Creatinine, Ser: 2.94 mg/dL — ABNORMAL HIGH (ref 0.44–1.00)
GFR calc Af Amer: 18 mL/min — ABNORMAL LOW (ref >60)
GFR calc Af Amer: 17 mL/min — ABNORMAL LOW (ref >60)
GFR calc non Af Amer: 15 mL/min — ABNORMAL LOW (ref >60)
GFR calc non Af Amer: 15 mL/min — ABNORMAL LOW (ref >60)
Glucose, Bld: 109 mg/dL — ABNORMAL HIGH (ref 70–99)
Glucose, Bld: 115 mg/dL — ABNORMAL HIGH (ref 70–99)
Potassium: 2.6 mmol/L — CL (ref 3.5–5.1)
Potassium: 2.6 mmol/L — CL (ref 3.5–5.1)
Sodium: 123 mmol/L — ABNORMAL LOW (ref 135–145)
Sodium: 122 mmol/L — ABNORMAL LOW (ref 135–145)

## 2018-11-16 LAB — PREPARE RBC (CROSSMATCH)

## 2018-11-16 LAB — GLUCOSE, CAPILLARY: Glucose-Capillary: 128 mg/dL — ABNORMAL HIGH (ref 70–99)

## 2018-11-16 MED ORDER — CHLORHEXIDINE GLUCONATE CLOTH 2 % EX PADS
6.0000 | MEDICATED_PAD | Freq: Every day | CUTANEOUS | Status: DC
  Administered 2018-11-16 – 2018-11-18 (×3): 6 via TOPICAL

## 2018-11-16 MED ORDER — POTASSIUM CHLORIDE 10 MEQ/50ML IV SOLN
10.0000 meq | INTRAVENOUS | Status: AC
  Administered 2018-11-16 – 2018-11-17 (×4): 10 meq via INTRAVENOUS
  Filled 2018-11-16 (×4): qty 50

## 2018-11-16 MED ORDER — POTASSIUM CHLORIDE 10 MEQ/50ML IV SOLN
10.0000 meq | INTRAVENOUS | Status: AC
  Administered 2018-11-16 (×4): 10 meq via INTRAVENOUS
  Filled 2018-11-16 (×4): qty 50

## 2018-11-16 MED ORDER — POTASSIUM CHLORIDE 10 MEQ/100ML IV SOLN
INTRAVENOUS | Status: AC
  Filled 2018-11-16: qty 100

## 2018-11-16 MED ORDER — SODIUM CHLORIDE 0.9% IV SOLUTION
Freq: Once | INTRAVENOUS | Status: AC
  Administered 2018-11-16: 12:00:00 via INTRAVENOUS

## 2018-11-16 MED ORDER — HEPARIN SODIUM (PORCINE) 5000 UNIT/ML IJ SOLN
5000.0000 [IU] | Freq: Three times a day (TID) | INTRAMUSCULAR | Status: DC
  Administered 2018-11-16 – 2018-11-18 (×5): 5000 [IU] via SUBCUTANEOUS
  Filled 2018-11-16 (×5): qty 1

## 2018-11-16 NOTE — Progress Notes (Signed)
Paged [place] regarding Hbg of 6.7. Orders received to repeated CBC results at 1200. If Hgb remains < 7 MD states we will transfuse. RN will continue to monitor closely.

## 2018-11-16 NOTE — Progress Notes (Signed)
Renal Navigator/LCSW met with patient's son/James "Nicole Kindred" and daughter/[name], Dr. Lin/Nephrologist and patient's RN in the [facility] consult room to provide support while Dr. Augustin Coupe discussed recommendation to stop HD and transition to comfort care.  Patient's children were understanding of the message and do not want to prolong suffering, but are understandably having a very difficult coming to terms with stopping treatments for their mother and allowing her to pass.  Children have questions about transitioning to comfort care. Renal Navigator explained, in general terms, options of hospital death vs hospice at home vs transfer to [facility], but state the details could best be explained by PMT. They wished to speak with PMT again. Renal Navigator contacted PMT pager and was told that they would definitely be seen again tomorrow and this afternoon if possible. Navigator told patient's children. Patient's daughter was extremely tearful at this time and had to leave the room for "air."  Patient's son showed a range of emotion from anger (punching the table), guilt (stating that he wished he had been able to save his mother and not wanting to give up on his mother), sadness (crying) and stating that he had to be "Superman" for the rest of the family, as he is the one whom everyone looks to for strength. Renal Navigator provided crisis intervention counseling as we began to process the information he just received. Renal Navigator encouraged him to tell some family members tonight about this meeting and allow his family, for whom he has cared for, to care for him. We processed his comment about giving up on his mother in depth and he stated appreciation. Navigator pointed out that it is the recommendation of the medical team to stop treatments because his mother's body has given up, not because her family has. Patient's son was extremely receptive to counseling and support offered. Navigator offered Spiritual Care  presence, but this was denied in the room and accepted for patient. Renal Navigator asked that secretary call Chaplain for patient per children's request. Renal Navigator left patient's son to himself in the consult room as he stated no further needs or questions at this time.  Renal Navigator will follow up with patient's daughter tomorrow to offer continued support.   Alphonzo Cruise,  Renal Navigator [phone]

## 2018-11-16 NOTE — Progress Notes (Signed)
Prayed with Ms. Courtney [name] and her daughter and her son.  Ms. Courtney [name] family expressed distress around her being alone once they have left for the night, and I let them know that a chaplain could go see Ms. Courtney [name] throughout the night and that she did not have to be alone.    Chaplain on-call has been told about Ms. Courtney [name] and will be checking on her.

## 2018-11-16 NOTE — Progress Notes (Signed)
MD notified of Potassium of 2.6 orders received for 4 runs of Potassium now and then to wait 4 hours and administer 4 more runs of potassium. RN asked MD if D10 needed to be held during administration of blood due to patient's hypervolemic state. MD stated to continue to run D10 at this time. Will continue to monitor closely.

## 2018-11-16 NOTE — Progress Notes (Signed)
Pt's Mews score 2 r/t LOC & Temp. No acute changes. Will continue to monitor.     11/16/18 1820  Vitals  Temp (!) 96.3 F (35.7 C)  Temp Source Axillary  BP 107/90  MAP (mmHg) 97  BP Location Left Arm  BP Method Automatic  Patient Position (if appropriate) Lying  Pulse Rate 74  ECG Heart Rate 76  Resp 15  Oxygen Therapy  SpO2 100 %  PCA/Epidural/Spinal Assessment  Respiratory Pattern Regular  Glasgow Coma Scale  Eye Opening 3  Best Verbal Response (NON-intubated) 2  Best Motor Response 6  Glasgow Coma Scale Score 11  Level of Consciousness  Level of Consciousness Responds to Voice  MEWS Score  MEWS RR 0  MEWS Pulse 0  MEWS Systolic 0  MEWS LOC 1  MEWS Temp 1  MEWS Score 2  MEWS Score Color Yellow  MEWS Assessment  Is this an acute change? No

## 2018-11-16 NOTE — Plan of Care (Signed)
  Problem: Clinical Measurements: Goal: Will remain free from infection Outcome: Progressing Goal: Respiratory complications will improve Outcome: Progressing Goal: Cardiovascular complication will be avoided Outcome: Progressing   Problem: Education: Goal: Knowledge of General Education information will improve Description: Including pain rating scale, medication(s)/side effects and non-pharmacologic comfort measures Outcome: Not Progressing   Problem: Health Behavior/Discharge Planning: Goal: Ability to manage health-related needs will improve Outcome: Not Progressing   Problem: Clinical Measurements: Goal: Ability to maintain clinical measurements within normal limits will improve Outcome: Not Progressing Goal: Diagnostic test results will improve Outcome: Not Progressing   Problem: Activity: Goal: Risk for activity intolerance will decrease Outcome: Not Progressing

## 2018-11-16 NOTE — Progress Notes (Signed)
MD notified about critical values including potassium of 2.6 and Hgb of 6.5. Due to inconsistencies with previous lab draw orders were received to redraw labs and re-report. Will continue to monitor closely.

## 2018-11-16 NOTE — Progress Notes (Signed)
NURSING PROGRESS NOTE  Courtney [name] [id number] Transfer Data: 11/16/2018 7:25 PM Attending Provider: Darliss Cheney, MD [id number], Samul Dada, MD Code Status: FULL   Courtney [name] is a 78 y.o. female patient transferred from [place]  -No acute distress noted.  -No complaints of shortness of breath.  -No complaints of chest pain.   Cardiac Monitoring: Box # M06 in place. Cardiac monitor yields:normal sinus rhythm.  Last Documented Vital Signs: Blood pressure 107/90, pulse 74, temperature (!) 96.3 F (35.7 C), temperature source Axillary, resp. rate 15, height 4\' 11"  (1.499 m), weight 40.6 kg, SpO2 100 %.  IV Fluids:  Triple lumen Picc, L IJ. D10 @ 100 infusing. Clean dry intact  Allergies:  Grapefruit flavor [flavoring agent]  Past Medical History:   has a past medical history of Anemia (07/2018), CHF (congestive heart failure) ([place]), Coronary artery disease, Diabetes mellitus without complication ([place]), ESRD on dialysis ([place]) (02/10/2017), Hyperlipidemia, Hypertension, Myocardial infarct ([place]), and Stroke ([place]).  Past Surgical History:   has a past surgical history that includes Appendectomy; Intramedullary (im) nail intertrochanteric (Right, 07/20/2012); Cataract extraction w/PHACO (Left, 06/29/2013); Cataract extraction w/PHACO (Right, 07/27/2013); Insertion of dialysis catheter (Right, 09/11/2015); ir generic historical (09/25/2015); ir generic historical (09/25/2015); ir generic historical (09/25/2015); AV fistula placement (Right, 09/20/2015); IR THROMBECTOMY AV FISTULA/W THROMBOLYSIS/PTA INC SHUNT/IMG RIGHT (Right, 09/06/2017); IR US Guide Vasc Access Right (09/06/2017); IR THROMBECTOMY AV FISTULA/W THROMBOLYSIS/PTA INC SHUNT/IMG RIGHT (Right, 09/29/2017); IR US Guide Vasc Access Right (09/29/2017); IR THROMBECTOMY AV FISTULA/W THROMBOLYSIS/PTA INC SHUNT/IMG RIGHT (Right, 07/20/2018); IR US Guide Vasc Access Right (07/20/2018); IR AV DIALY SHUNT INTRO NEEDLE/INTRAC INITIAL W/PTA/STENT/IMG RIGHT (Right, 07/20/2018); IR US  Guide Vasc Access Right (07/20/2018); Esophagogastroduodenoscopy (N/A, 08/03/2018); Sclerotherapy (08/03/2018); Hot hemostasis (N/A, 08/03/2018); Colonoscopy with propofol (N/A, 08/07/2018); Esophagogastroduodenoscopy (egd) with propofol (N/A, 08/07/2018); Givens capsule study (N/A, 08/07/2018); and Hemostasis clip placement (08/07/2018).  Social History:   reports that she has never smoked. She has never used smokeless tobacco. She reports that she does not drink alcohol or use drugs.  Skin: intact except where otherwise charted. 2+ generalized pitting edema.  Patient/Family orientated to room. Information packet given to patient/family. Admission inpatient armband information verified with patient/family to include name and date of birth and placed on patient arm. Side rails up x 2, fall assessment and education completed with patient/family. Patient/family able to verbalize understanding of risk associated with falls and verbalized understanding to call for assistance before getting out of bed. Call light within reach. Patient/family able to voice and demonstrate understanding of unit orientation instructions.

## 2018-11-16 NOTE — Progress Notes (Signed)
PROGRESS NOTE    Courtney [name]  [id number] DOB: 13-Feb-1941 DOA: 11/02/2018 PCP: Neale Burly, MD   Brief Narrative:   Courtney [name] is a 78 y.o. female with medical history significant for ESRD on HD TTS, chronic hypotension on midodrine, severe protein-calorie malnutrition, prior CVA, type 2 diabetes, CAD/MI, dyslipidemia, and chronic diastolic CHF, who was brought to [facility] on 11/04/2018  from her hemodialysis facility due to severe hypotension.  Her major complaints were weakness and fatigue.  Apparently she had missed her last 2 HD appointments.  She also reported right foot chronic ulcer.  Upon arrival to the ED, patient noted to be significantly hypotensive as well as hypothermic.  Initial blood pressure was 65/49 and temperature was 94.8.  She was given 2.5 L of fluid and Zosyn.  Chest x-ray showed pulmonary vascular congestion and bilateral pleural effusion.  Due to the need of nephrology consult and hemodialysis, patient was transferred to [facility].    According to her son at bedside, she is only had some random aches that have been bothering her for the last several weeks, but aside from this she apparently did not complain of any other symptoms such as fever, chills, cough, shortness of breath, chest pain, abdominal pain, nausea, or vomiting.  Son is the primary historian at bedside as patient appears quite weak and somnolent.  She also reports that she is wheelchair-bound and confused at baseline.  She also had 1 week history of diarrhea and decreased oral intake.  Before transferring to [facility], she had CVC right IJ on 917 at [facility].  She was also suspected to be having severe sepsis which was also thought to be because of her hypotension.  She was started on Zosyn and vancomycin which were subsequently switched to ceftriaxone on 11/11/2018.  Currently, she also received Levophed in the ICU for possible septic versus hypovolemic shock.  She was also encephalopathic.  Nephrology was consulted and  she had 1 session of dialysis on 11/12/2018.  She was subsequently transferred under [facility] care on 11/13/2018.  Family including daughter and son had a long discussion with palliative care.  Reportedly, daughter is interested in comfort care however son wants to pursue different route.   Assessment & Plan:   Active Problems:   Pressure injury of skin   Hypotension   Sepsis ([place])   ESRD on hemodialysis ([place])   General weakness  Hypotension in the setting of septic vs bulimic shock/chronic hypotension on midodrine/gram-negative bacteremia.:  Off of Levophed.  Blood culture from 11/17/2018 is now growing Proteus.  Presumed source urine however urine culture is negative so far.  Blood pressure much better.  Continue Rocephin.  Acute metabolic vs uremic encephalopathy: Has dementia and chronically confused.  She is at her baseline of 2 slightly lethargic today likely due to anemia.  Non-gap metabolic acidosis/lactic acidosis/ESRD on HD TTS: Resolved.  Nephrology on board.   Further management per nephrology.  Anemia of chronic disease: Hemoglobin dropped to 6.7 this morning, repeat 6.3.  Will transfuse 1 unit of PRBC.  Type 2 diabetes mellitus: Blood sugar much better.  Continue dextrose 10%.  GERD: PPI  Goal of care: Son and daughter had a long meeting with palliative care 2 days ago.  Daughter wants to pursue comfort care however son does not agree with that.  They are going to have meeting with nephrology and palliative care again today.  DVT prophylaxis: Heparin Code Status: Full code Family Communication:  None present at  bedside.  Family had meeting with palliative care and they are going to meet again today. Disposition Plan: TBD   Consultants:   Nephrology  PCCM  Procedures:   Right CV C IJ 11/06/2018  Antimicrobials:   IV Zosyn and vancomycin 11/09/2018> 11/11/2018  IV Rocephin 11/11/2018   Subjective: Patient seen and examined.  She is slightly lethargic  today.  Objective: Vitals:   11/16/18 0000 11/16/18 0400 11/16/18 0600 11/16/18 0800  BP: (!) 116/56 123/63 (!) 99/37 108/76  Pulse:    86  Resp: 20 18 16 16   Temp: 99.5 F (37.5 C) 99.5 F (37.5 C)    TempSrc: Oral Oral    SpO2:  98%  100%  Weight:      Height:        Intake/Output Summary (Last 24 hours) at 11/16/2018 1010 Last data filed at 11/16/2018 0600 Gross per 24 hour  Intake 2090.84 ml  Output --  Net 2090.84 ml   Filed Weights   11/14/18 1346 11/14/18 1646 11/15/18 0415  Weight: 41.5 kg 40 kg 40.6 kg    Examination:  General exam: Appears lethargic Respiratory system: Clear to auscultation. Respiratory effort normal. Cardiovascular system: S1 & S2 heard, RRR. No JVD, murmurs, rubs, gallops or clicks.  +2 pitting edema bilateral upper extremity Gastrointestinal system: Abdomen is nondistended, soft and nontender. No organomegaly or masses felt. Normal bowel sounds heard. Central nervous system: Lethargic.  Has chronic hypertonicity and structures in bilateral lower extremities, weakness in right upper extremity Skin: No rashes, lesions or ulcers.  Psychiatry: Has chronic dementia.   Data Reviewed: I have personally reviewed following labs and imaging studies  CBC: Recent Labs  Lab 10/29/2018 1304 11/11/18 0354 11/12/18 1348 11/13/18 0426 11/16/18 0823 11/16/18 0930  WBC 8.5 9.8 9.0 6.3 5.9 5.4  NEUTROABS 6.6  --   --   --  5.0  --   HGB 11.8* 8.5* 9.1* 7.5* 6.7* 6.3*  HCT 38.7 27.1* 27.8* 23.1* 20.2* 18.9*  MCV 93.0 90.9 88.3 89.5 87.4 87.9  PLT 155 122* 113* 81* 76* 67*   Basic Metabolic Panel: Recent Labs  Lab  1304 11/11/18 0354 11/12/18 1348 11/13/18 0426 11/16/18 0823  NA 137 138 136 135 123*  K 4.4 3.9 3.6 3.8 2.6*  CL 105 107 106 106 90*  CO2 21* 21* 18* 23 23  GLUCOSE 178* 101* 120* 85 109*  BUN 35* 35* 40* 20 18  CREATININE 5.53* 5.35* 5.79* 3.33* 2.85*  CALCIUM 7.2* 7.2* 7.3* 7.1* 7.0*  MG  --  1.8  --  1.7  --   PHOS   --  3.1 2.7 2.1*  --    GFR: Estimated Creatinine Clearance: 10.6 mL/min (A) (by C-G formula based on SCr of 2.85 mg/dL (H)). Liver Function Tests: Recent Labs  Lab 11/01/2018 1304 11/12/18 1348  AST 22  --   ALT 14  --   ALKPHOS 65  --   BILITOT 0.2*  --   PROT 5.0*  --   ALBUMIN 1.6* 1.3*   No results for input(s): LIPASE, AMYLASE in the last 168 hours. No results for input(s): AMMONIA in the last 168 hours. Coagulation Profile: Recent Labs  Lab 11/23/2018 1304  INR 1.1   Cardiac Enzymes: No results for input(s): CKTOTAL, CKMB, CKMBINDEX, TROPONINI in the last 168 hours. BNP (last 3 results) No results for input(s): PROBNP in the last 8760 hours. HbA1C: No results for input(s): HGBA1C in the last 72 hours. CBG: Recent Labs  Lab 11/15/18 1135 11/15/18 1551 11/15/18 1935 11/15/18 2315 11/16/18 0442  GLUCAP 98 81 82 108* 128*   Lipid Profile: No results for input(s): CHOL, HDL, LDLCALC, TRIG, CHOLHDL, LDLDIRECT in the last 72 hours. Thyroid Function Tests: No results for input(s): TSH, T4TOTAL, FREET4, T3FREE, THYROIDAB in the last 72 hours. Anemia Panel: No results for input(s): VITAMINB12, FOLATE, FERRITIN, TIBC, IRON, RETICCTPCT in the last 72 hours. Sepsis Labs: Recent Labs  Lab 11/14/2018 1304  1514 11/09/2018 1522 11/11/18 0354  PROCALCITON  --   --  0.20  --   LATICACIDVEN 3.0* 2.5*  --  1.4    Recent Results (from the past 240 hour(s))  Culture, blood (Routine x 2)     Status: Abnormal   Collection Time: 11/11/2018  1:04 PM   Specimen: BLOOD  Result Value Ref Range Status   Specimen Description   Final    BLOOD LEFT ANTECUBITAL Performed at [facility], [address], [place], [place] [address]    Special Requests   Final    BOTTLES DRAWN AEROBIC AND ANAEROBIC Blood Culture adequate volume Performed at [place] [place], [place] [address]    Culture  Setup Time   Final    IN BOTH AEROBIC AND ANAEROBIC BOTTLES GRAM  NEGATIVE RODS Gram Stain Report Called to,Read Back By and Verified With: A KOTEY,RN @0523  11/11/18 MKELLY CRITICAL RESULT CALLED TO, READ BACK BY AND VERIFIED WITH: T. BAUMEISTER, PHARMD AT 1000 ON 11/11/18 BY C. JESSUP, MT. Performed at [facility], [place] [address], [place], [place] [address]    Culture PROTEUS VULGARIS (A)  Final   Report Status 11/13/2018 FINAL  Final   Organism ID, Bacteria PROTEUS VULGARIS  Final      Susceptibility   Proteus vulgaris - MIC*    AMPICILLIN >=32 RESISTANT Resistant     CEFAZOLIN >=64 RESISTANT Resistant     CEFEPIME <=1 SENSITIVE Sensitive     CEFTAZIDIME <=1 SENSITIVE Sensitive     CEFTRIAXONE <=1 SENSITIVE Sensitive     CIPROFLOXACIN <=0.25 SENSITIVE Sensitive     GENTAMICIN <=1 SENSITIVE Sensitive     IMIPENEM 1 SENSITIVE Sensitive     TRIMETH/SULFA <=20 SENSITIVE Sensitive     AMPICILLIN/SULBACTAM 8 SENSITIVE Sensitive     PIP/TAZO <=4 SENSITIVE Sensitive     * PROTEUS VULGARIS  Blood Culture ID Panel (Reflexed)     Status: Abnormal   Collection Time: 11/07/2018  1:04 PM  Result Value Ref Range Status   Enterococcus species NOT DETECTED NOT DETECTED Final   Listeria monocytogenes NOT DETECTED NOT DETECTED Final   Staphylococcus species NOT DETECTED NOT DETECTED Final   Staphylococcus aureus (BCID) NOT DETECTED NOT DETECTED Final   Streptococcus species NOT DETECTED NOT DETECTED Final   Streptococcus agalactiae NOT DETECTED NOT DETECTED Final   Streptococcus pneumoniae NOT DETECTED NOT DETECTED Final   Streptococcus pyogenes NOT DETECTED NOT DETECTED Final   Acinetobacter baumannii NOT DETECTED NOT DETECTED Final   Enterobacteriaceae species DETECTED (A) NOT DETECTED Final    Comment: Enterobacteriaceae represent a large family of gram-negative bacteria, not a single organism. CRITICAL RESULT CALLED TO, READ BACK BY AND VERIFIED WITH: T. BAUMEISTER, PHARMD AT 1000 ON 11/11/18 BY C. JESSUP, MT.    Enterobacter cloacae complex NOT  DETECTED NOT DETECTED Final   Escherichia coli NOT DETECTED NOT DETECTED Final   Klebsiella oxytoca NOT DETECTED NOT DETECTED Final   Klebsiella pneumoniae NOT DETECTED NOT DETECTED Final   Proteus  species DETECTED (A) NOT DETECTED Final    Comment: CRITICAL RESULT CALLED TO, READ BACK BY AND VERIFIED WITH: T. BAUMEISTER, PHARMD AT 1000 ON 11/11/18 BY C. JESSUP, MT.    Serratia marcescens NOT DETECTED NOT DETECTED Final   Carbapenem resistance NOT DETECTED NOT DETECTED Final   Haemophilus influenzae NOT DETECTED NOT DETECTED Final   Neisseria meningitidis NOT DETECTED NOT DETECTED Final   Pseudomonas aeruginosa NOT DETECTED NOT DETECTED Final   Candida albicans NOT DETECTED NOT DETECTED Final   Candida glabrata NOT DETECTED NOT DETECTED Final   Candida krusei NOT DETECTED NOT DETECTED Final   Candida parapsilosis NOT DETECTED NOT DETECTED Final   Candida tropicalis NOT DETECTED NOT DETECTED Final    Comment: Performed at [facility], [place] [address], [place], [place] [address]  SARS Coronavirus 2 [address] [facility] order, Performed in [facility] hospital lab) Nasopharyngeal Nasopharyngeal Swab     Status: None   Collection Time: 11/20/2018  3:12 PM   Specimen: Nasopharyngeal Swab  Result Value Ref Range Status   SARS Coronavirus 2 NEGATIVE NEGATIVE Final    Comment: (NOTE) If result is NEGATIVE SARS-CoV-2 target nucleic acids are NOT DETECTED. The SARS-CoV-2 RNA is generally detectable in upper and lower  respiratory specimens during the acute phase of infection. The lowest  concentration of SARS-CoV-2 viral copies this assay can detect is 250  copies / mL. A negative result does not preclude SARS-CoV-2 infection  and should not be used as the sole basis for treatment or other  patient management decisions.  A negative result may occur with  improper specimen collection / handling, submission of specimen other  than nasopharyngeal swab, presence of viral mutation(s) within the  areas  targeted by this assay, and inadequate number of viral copies  (<250 copies / mL). A negative result must be combined with clinical  observations, patient history, and epidemiological information. If result is POSITIVE SARS-CoV-2 target nucleic acids are DETECTED. The SARS-CoV-2 RNA is generally detectable in upper and lower  respiratory specimens dur ing the acute phase of infection.  Positive  results are indicative of active infection with SARS-CoV-2.  Clinical  correlation with patient history and other diagnostic information is  necessary to determine patient infection status.  Positive results do  not rule out bacterial infection or co-infection with other viruses. If result is PRESUMPTIVE POSTIVE SARS-CoV-2 nucleic acids MAY BE PRESENT.   A presumptive positive result was obtained on the submitted specimen  and confirmed on repeat testing.  While 2019 novel coronavirus  (SARS-CoV-2) nucleic acids may be present in the submitted sample  additional confirmatory testing may be necessary for epidemiological  and / or clinical management purposes  to differentiate between  SARS-CoV-2 and other Sarbecovirus currently known to infect humans.  If clinically indicated additional testing with an alternate test  methodology [phone]) is advised. The SARS-CoV-2 RNA is generally  detectable in upper and lower respiratory sp ecimens during the acute  phase of infection. The expected result is Negative. Fact Sheet for Patients:  StrictlyIdeas.no Fact Sheet for Healthcare Providers: BankingDealers.co.za This test is not yet approved or cleared by the [place] FDA and has been authorized for detection and/or diagnosis of SARS-CoV-2 by FDA under an Emergency Use Authorization (EUA).  This EUA will remain in effect (meaning this test can be used) for the duration of the COVID-19 declaration under Section 564(b)(1) of the Act, 21 U.S.C. section  360bbb-3(b)(1), unless the authorization is terminated or revoked sooner. Performed  at [facility], [address], [place], [place] [address]   Culture, blood (Routine x 2)     Status: None   Collection Time: 11/02/2018  3:14 PM   Specimen: Left Antecubital; Blood  Result Value Ref Range Status   Specimen Description LEFT ANTECUBITAL  Final   Special Requests   Final    BOTTLES DRAWN AEROBIC ONLY Blood Culture adequate volume   Culture   Final    NO GROWTH 5 DAYS Performed at [facility], [address] [place], [place] [address]    Report Status 11/15/2018 FINAL  Final  MRSA PCR Screening     Status: None   Collection Time: 11/17/2018 10:50 PM   Specimen: Nasopharyngeal  Result Value Ref Range Status   MRSA by PCR NEGATIVE NEGATIVE Final    Comment:        The GeneXpert MRSA Assay (FDA approved for NASAL specimens only), is one component of a comprehensive MRSA colonization surveillance program. It is not intended to diagnose MRSA infection nor to guide or monitor treatment for MRSA infections. Performed at [facility], [place] [address], [place], [place] [address]   C difficile quick scan w PCR reflex     Status: None   Collection Time: 10/25/2018 10:52 PM   Specimen: STOOL  Result Value Ref Range Status   C Diff antigen NEGATIVE NEGATIVE Final   C Diff toxin NEGATIVE NEGATIVE Final   C Diff interpretation No C. difficile detected.  Final    Comment: Performed at [facility], [place] [address], [place], [place] [address]  Urine Culture     Status: Abnormal   Collection Time: 11/11/18  8:33 PM   Specimen: Urine, Random  Result Value Ref Range Status   Specimen Description URINE, RANDOM  Final   Special Requests   Final    NONE Performed at [facility], [place] [address], [place], [place] [address]    Culture (A)  Final    >=100,000 COLONIES/mL STAPHYLOCOCCUS SPECIES (COAGULASE NEGATIVE)   Report Status 11/14/2018 FINAL  Final   Organism ID, Bacteria  STAPHYLOCOCCUS SPECIES (COAGULASE NEGATIVE) (A)  Final      Susceptibility   Staphylococcus species (coagulase negative) - MIC*    CIPROFLOXACIN >=8 RESISTANT Resistant     GENTAMICIN <=0.5 SENSITIVE Sensitive     NITROFURANTOIN <=16 SENSITIVE Sensitive     OXACILLIN >=4 RESISTANT Resistant     TETRACYCLINE <=1 SENSITIVE Sensitive     VANCOMYCIN <=0.5 SENSITIVE Sensitive     TRIMETH/SULFA <=10 SENSITIVE Sensitive     CLINDAMYCIN <=0.25 SENSITIVE Sensitive     RIFAMPIN <=0.5 SENSITIVE Sensitive     Inducible Clindamycin NEGATIVE Sensitive     * >=100,000 COLONIES/mL STAPHYLOCOCCUS SPECIES (COAGULASE NEGATIVE)      Radiology Studies: No results found.  Scheduled Meds:  aspirin EC  81 mg Oral Daily   atorvastatin  20 mg Oral Daily   Chlorhexidine Gluconate Cloth  6 each Topical Daily   escitalopram  5 mg Oral Daily   feeding supplement  1 Container Oral TID BM   feeding supplement (PRO-STAT SUGAR FREE 64)  30 mL Oral TID WC   fludrocortisone  0.1 mg Oral Daily   heparin  5,000 Units Subcutaneous Q8H   lipase/protease/amylase  12,000 Units Oral TID AC   midodrine  10 mg Oral TID WC   multivitamin  1 tablet Oral QHS   pantoprazole  40 mg Oral BID   sodium chloride  flush  10-40 mL Intracatheter Q12H   Continuous Infusions:  sodium chloride     sodium chloride     cefTRIAXone (ROCEPHIN)  IV Stopped (11/15/18 1226)   dextrose 100 mL/hr at 11/16/18 0600     LOS: 6 days   Time spent: 26 minutes   Darliss Cheney, MD [facility] Pager [phone]  If 7PM-7AM, please contact night-coverage [email or web address] Password [email or web address] 11/16/2018, 10:10 AM

## 2018-11-16 NOTE — Progress Notes (Signed)
[facility] Progress Note    Assessment/ Plan:   Dialysis Orders:TTS [facility] 3h (usual runs 2h) 32.5kg 2/2.5 bath R AVG  Assessment/Plan: 1. Hypotension/Septic Shock- pt with acute on chronic hypotension and has been unable to tolerate outpatient HD this week. GNR bacteremia noted, Proteus. On Broad-spectrum antibiotics. Her pressures have increased with appropriate rx. Overall, it looks like she has had functional decline and FTT picture. S/p IHD yesterday, Saturday, with only 500 mL UF.Onflorinef and midodrine.  2.  ESRD- As above. She has not been tolerating outpatient HD due to hypotension.Need to give midodrineand albuminbefore rxs. - Tolerated HD 9/21; will hold off on HD till after the meeting today at 230-3PM. Sister has been advocating to keep her comfortable given lack of improvement and poor QOL. I would also like to withdraw HD and move to comfort care.   3. Hypertension/volume- Off pressors, Bps improved, florinef and midodrine as above  4. Anemia- stable  5. Metabolic bone disease- Check phos levels; 2.1. not on binders. Function of poor nutritional status.  6. Severe Protein-caloric Malnutrition- alb 1.6, poor po intake.supplements. Needs someone to feed her.  7. Dispo: palliative care note appreciated. Fam meeting 9/21 per their notes. But will need to meet again today.  Subjective:   Looks worse today; even more lethargic than usual   Objective:   BP (!) 109/54 (BP Location: Left Arm)   Pulse 74   Temp 99.5 F (37.5 C) (Oral)   Resp 12   Ht 4\' 11"  (1.499 m)   Wt 40.6 kg   SpO2 100%   BMI 18.08 kg/m   Intake/Output Summary (Last 24 hours) at 11/16/2018 1044 Last data filed at 11/16/2018 0600 Gross per 24 hour  Intake 2090.84 ml  Output -  Net 2090.84 ml   Weight change:   Physical Exam: [id number], curled up in bed, less arousable today CVS: RRR Resp: clear anteriorly Abd:  thin Ext: severely sarcopenic, 2+ upper and lower LE edema. ACCESS: R AVG + T/B  Imaging: No results found.  Labs: BMET Recent Labs  Lab 11/15/2018 1304 11/11/18 0354 11/12/18 1348 11/13/18 0426 11/16/18 0823  NA 137 138 136 135 123*  K 4.4 3.9 3.6 3.8 2.6*  CL 105 107 106 106 90*  CO2 21* 21* 18* 23 23  GLUCOSE 178* 101* 120* 85 109*  BUN 35* 35* 40* 20 18  CREATININE 5.53* 5.35* 5.79* 3.33* 2.85*  CALCIUM 7.2* 7.2* 7.3* 7.1* 7.0*  PHOS  --  3.1 2.7 2.1*  --    CBC Recent Labs  Lab 10/25/2018 1304  11/12/18 1348 11/13/18 0426 11/16/18 0823 11/16/18 0930  WBC 8.5   < > 9.0 6.3 5.9 5.4  NEUTROABS 6.6  --   --   --  5.0  --   HGB 11.8*   < > 9.1* 7.5* 6.7* 6.3*  HCT 38.7   < > 27.8* 23.1* 20.2* 18.9*  MCV 93.0   < > 88.3 89.5 87.4 87.9  PLT 155   < > 113* 81* 76* 67*   < > = values in this interval not displayed.    Medications:    . sodium chloride   Intravenous Once  . aspirin EC  81 mg Oral Daily  . atorvastatin  20 mg Oral Daily  . Chlorhexidine Gluconate Cloth  6 each Topical Daily  . escitalopram  5 mg Oral Daily  . feeding supplement  1 Container Oral TID BM  . feeding supplement (PRO-STAT SUGAR  FREE 64)  30 mL Oral TID WC  . fludrocortisone  0.1 mg Oral Daily  . heparin  5,000 Units Subcutaneous Q8H  . lipase/protease/amylase  12,000 Units Oral TID AC  . midodrine  10 mg Oral TID WC  . multivitamin  1 tablet Oral QHS  . pantoprazole  40 mg Oral BID  . sodium chloride flush  10-40 mL Intracatheter Q12H      Otelia Santee, MD 11/16/2018, 10:44 AM

## 2018-11-16 NOTE — Progress Notes (Signed)
MD notified of patient being increasingly somnolent and having episodes of vomiting. Zofran was given at  0734. MD stated it was okay to hold P.O. medications due to patients current state. Will continue to monitor closely.

## 2018-11-17 LAB — BASIC METABOLIC PANEL
Anion gap: 9 (ref 5–15)
BUN: 20 mg/dL (ref 8–23)
CO2: 20 mmol/L — ABNORMAL LOW (ref 22–32)
Calcium: 7.1 mg/dL — ABNORMAL LOW (ref 8.9–10.3)
Chloride: 90 mmol/L — ABNORMAL LOW (ref 98–111)
Creatinine, Ser: 2.97 mg/dL — ABNORMAL HIGH (ref 0.44–1.00)
GFR calc Af Amer: 17 mL/min — ABNORMAL LOW (ref >60)
GFR calc non Af Amer: 15 mL/min — ABNORMAL LOW (ref >60)
Glucose, Bld: 93 mg/dL (ref 70–99)
Potassium: 5.5 mmol/L — ABNORMAL HIGH (ref 3.5–5.1)
Sodium: 119 mmol/L — CL (ref 135–145)

## 2018-11-17 LAB — TYPE AND SCREEN
ABO/RH(D): A POS
Antibody Screen: NEGATIVE
Unit division: 0

## 2018-11-17 LAB — BPAM RBC
Blood Product Expiration Date: 202010192359
ISSUE DATE / TIME: 202009231205
Unit Type and Rh: 6200

## 2018-11-17 MED ORDER — MORPHINE SULFATE (PF) 2 MG/ML IV SOLN
2.0000 mg | Freq: Once | INTRAVENOUS | Status: AC
  Administered 2018-11-17: 2 mg via INTRAVENOUS
  Filled 2018-11-17: qty 1

## 2018-11-17 NOTE — Progress Notes (Signed)
Renal Navigator spoke with Dr. Tacey Ruiz, Dr. Lynder Parents, N. Pickenpack-Cousar/PMT, and bedside RN. Plan to wait to hear from PMT regarding when family may be present at the hospital today prior to following up with them from meeting yesterday. Appreciate collaboration.  Alphonzo Cruise, [place] Renal Navigator [phone]

## 2018-11-17 NOTE — Progress Notes (Signed)
PALLIATIVE NOTE:  Patient somnolent. Will open eyes briefly to verbal commands. Will not follow simple commands. When asked if she is in pain she opens eyes and then closes them. No acute distress noted. No family at the bedside.   Was hoping to meet with family today and further discuss goals of care. Family originally specified they would be at the hospital to visit this afternoon by 3:30pm however, have not arrived. Per Jaclyn Shaggy she recently spoke with daughter who stated she was still waiting on her brother. They live over an hour away. Unfortunately our service will not be available to meet with them today. As previously discussed with daughter she was not interested in engaging in phone discussions regarding her mother's care and wanted to wait until she and her brother was able to be at the bedside.   Palliative Medicine provider will attempt to follow-up with family tomorrow with hopes of continued discussions regarding transitioning care to comfort, code status, and hospice options vs. Anticipated hospital death.   Patient's prognosis is poor with and expectancy of 2 weeks or less (sooner) in the setting of agreement to discontinuing HD, continued hypotension, deconditioning, poor po intake, albumin 1.3, and sepsis. Patient will be appropriate for residential hospice if family is in agreement with transitioning to comfort for EOL care.   Assessment -Somnolent, critically-ill appearing, cachectic -RRR -diminished bilaterally -anasarca -unable to assess mentation  Plan -Full Code-family remains in discussion -Continue current plan of care per attending  -PMT will attempt to re-engage with family tomorrow for further [place] discussions regarding transitioning to full comfort, d/c HD, code status, and hospice options vs. Anticipated hospital death. (I will not be on service after today, but will report off for colleague to resume care) -PMT will continue to support and follow.  Total Time: 20  min.   Greater than 50%  of this time was spent counseling and coordinating care related to the above assessment and plan.  Alda Lea, AGPCNP-BC Palliative Medicine Team

## 2018-11-17 NOTE — Progress Notes (Signed)
Attempted to administer AM medicines. Was successful with lexapro. However, pt. Appeared uncomfortable with swallowing and shook her head when asked if she can swallow. Did not feel comfortable attempting more medicines. MD notified. Will continue to monitor.

## 2018-11-17 NOTE — Progress Notes (Addendum)
PALLIATIVE NOTE:  Reviewed notes regarding Nephrology meeting with family yesterday.   Called and spoke with daughter, [name]. Attempted to discuss updates and concerns for her mother suffering and in pain. [name] replied "I know she is not doing well, but I am waiting to hear from my brother." I offered to meet with her and her brother today to continue goals of care with hopes of transitioning to a full comfort approach. [name] states she is waiting to hear from her brother which may be around noon sometime and would call and let me know what time they would be available. I also offered to have discussion via phone if they were in agreement, however, [name] expressed she did not want to discuss over the phone again emphasizing she is waiting to hear from her brother on what time he wished to come up to the hospital. Acknowledged her request, also making her aware of previous scheduled commitments after 2pm with hopes we could meet prior to this time. She states "I can not rush him and these aren't rush decisions. He needs time!" Verbalized understanding. [name] states she would call once she heard from him and provide me with the chosen time. Confirmed she had my contact information. She did express she had been reviewing information given (printed hospice material and Hard Choices Book).   Spoke with Jaclyn Shaggy, LCSW (Renal Navigator) and received updates regarding meeting yesterday and plans for today. We agreed to touch base around 11-12 or sooner if I hear back from family. I will provide updates at that time to her and Dr. Augustin Coupe.   Appreciate the opportunity to collaborate and assist in providing the best possible care for Courtney [name].   Alda Lea, AGPCNP-BC Palliative Medicine Team   NO CHARGE

## 2018-11-17 NOTE — Progress Notes (Signed)
PROGRESS NOTE    Courtney [name]  [id number] DOB: 29-May-1940 DOA: 11/05/2018 PCP: Neale Burly, MD   Brief Narrative:   Courtney [name] is a 78 y.o. female with medical history significant for ESRD on HD TTS, chronic hypotension on midodrine, severe protein-calorie malnutrition, prior CVA, type 2 diabetes, CAD/MI, dyslipidemia, and chronic diastolic CHF, who was brought to [place] on 11/17/2018  from her hemodialysis facility due to severe hypotension.  Her major complaints were weakness and fatigue.  Apparently she had missed her last 2 HD appointments.  She also reported right foot chronic ulcer.  Upon arrival to the ED, patient noted to be significantly hypotensive as well as hypothermic.  Initial blood pressure was 65/49 and temperature was 94.8.  She was given 2.5 L of fluid and Zosyn.  Chest x-ray showed pulmonary vascular congestion and bilateral pleural effusion.  Due to the need of nephrology consult and hemodialysis, patient was transferred to [facility].    According to her son at bedside, she is only had some random aches that have been bothering her for the last several weeks, but aside from this she apparently did not complain of any other symptoms such as fever, chills, cough, shortness of breath, chest pain, abdominal pain, nausea, or vomiting.  Son is the primary historian at bedside as patient appears quite weak and somnolent.  She also reports that she is wheelchair-bound and confused at baseline.  She also had 1 week history of diarrhea and decreased oral intake.  Before transferring to [facility], she had CVC right IJ on 917 at [facility].  She was also suspected to be having severe sepsis which was also thought to be because of her hypotension.  She was started on Zosyn and vancomycin which were subsequently switched to ceftriaxone on 11/11/2018.  Currently, she also received Levophed in the ICU for possible septic versus hypovolemic shock.  She was also encephalopathic.  Nephrology was consulted and  she had 1 session of dialysis on 11/12/2018.  She was subsequently transferred under [facility] care on 11/13/2018.  Family including daughter and son had a long discussion with palliative care.  Reportedly, daughter is interested in comfort care however son wants to pursue different route however they have not come to a final conclusion yet.   Assessment & Plan:   Active Problems:   Pressure injury of skin   Hypotension   Sepsis ([place])   ESRD on hemodialysis ([place])   General weakness  Hypotension in the setting of septic vs bulimic shock/chronic hypotension on midodrine/gram-negative bacteremia.:  Off of Levophed.  Blood culture from 11/15/2018 is now growing Proteus.  Presumed source urine however urine culture is negative so far.  Blood pressure much better.  Continue Rocephin.  Acute metabolic vs uremic encephalopathy: Has dementia and chronically confused.  She is at her baseline.   Non-gap metabolic acidosis/lactic acidosis/ESRD on HD TTS: Resolved.  Nephrology on board.  Nephrology likes to withdraw HD and will follow with comfort care however family is still confused about making final decision.  All the help appreciated from renal navigator and palliative care.  Anemia of chronic disease: Her hemoglobin dropped to 6.5 yesterday.  She received 1 PRBC transfusion.  Patient's labs have not been drawn today for some reason.  Will contact RN to investigate on that.  Type 2 diabetes mellitus: Blood sugar much better.  Continue dextrose 10%.  GERD: PPI  Goal of care: Son and daughter had a long meeting with palliative care 3  days ago.  Daughter wants to pursue comfort care however son does not agree with that.  They had meeting with nephrology and palliative care once again yesterday however they have not come to the conclusion.  Nephrology highly recommends withdrawing HD care since patient seems to be actively dying.  Palliative care and renal navigator trying to work with family and providing  support and help.  DVT prophylaxis: Heparin Code Status: Full code Family Communication:  None present at bedside.  Family talking to palliative care. Disposition Plan: TBD   Consultants:   Nephrology  PCCM  Procedures:   Right CV C IJ 10/27/2018  Antimicrobials:   IV Zosyn and vancomycin 10/29/2018> 11/11/2018  IV Rocephin 11/11/2018   Subjective: Patient seen and examined.  She was slightly more awake today.  She was confused though.  Looks comfortable.  Objective: Vitals:   11/16/18 2328 11/17/18 0019 11/17/18 0419 11/17/18 0809  BP:  121/90 (!) 116/58 (!) 115/58  Pulse:  81 73 73  Resp:  16 20 12   Temp: (!) 97.4 F (36.3 C)  97.7 F (36.5 C) 97.6 F (36.4 C)  TempSrc: Axillary  Axillary Axillary  SpO2:  100% 100% 100%  Weight:      Height:        Intake/Output Summary (Last 24 hours) at 11/17/2018 0811 Last data filed at 11/17/2018 0552 Gross per 24 hour  Intake 1441.01 ml  Output -  Net 1441.01 ml   Filed Weights   11/14/18 1346 11/14/18 1646 11/15/18 0415  Weight: 41.5 kg 40 kg 40.6 kg    Examination:  General exam: Appears lethargic Respiratory system: Diminished breath sounds in the bases due to poor inspiratory effort Cardiovascular system: S1 & S2 heard, RRR. No JVD, murmurs, rubs, gallops or clicks. No pedal edema. Gastrointestinal system: Abdomen is nondistended, soft and nontender. No organomegaly or masses felt. Normal bowel sounds heard. Central nervous system: Lethargic Extremities: Symmetric 5 x 5 power. Skin: No rashes, lesions or ulcers.  Psychiatry: Too lethargic to to be assessed   Data Reviewed: I have personally reviewed following labs and imaging studies  CBC: Recent Labs  Lab 11/15/2018 1304  11/12/18 1348 11/13/18 0426 11/16/18 0823 11/16/18 0930 11/16/18 1051  WBC 8.5   < > 9.0 6.3 5.9 5.4 5.9  NEUTROABS 6.6  --   --   --  5.0  --   --   HGB 11.8*   < > 9.1* 7.5* 6.7* 6.3* 6.5*  HCT 38.7   < > 27.8* 23.1* 20.2* 18.9*  19.2*  MCV 93.0   < > 88.3 89.5 87.4 87.9 87.3  PLT 155   < > 113* 81* 76* 67* 71*   < > = values in this interval not displayed.   Basic Metabolic Panel: Recent Labs  Lab 11/11/18 0354 11/12/18 1348 11/13/18 0426 11/16/18 0823 11/16/18 1051 11/17/18 0100  NA 138 136 135 123* 122* 119*  K 3.9 3.6 3.8 2.6* 2.6* 5.5*  CL 107 106 106 90* 91* 90*  CO2 21* 18* 23 23 22  20*  GLUCOSE 101* 120* 85 109* 115* 93  BUN 35* 40* 20 18 18 20   CREATININE 5.35* 5.79* 3.33* 2.85* 2.94* 2.97*  CALCIUM 7.2* 7.3* 7.1* 7.0* 6.9* 7.1*  MG 1.8  --  1.7  --   --   --   PHOS 3.1 2.7 2.1*  --   --   --    GFR: Estimated Creatinine Clearance: 10.2 mL/min (A) (by C-G formula based  on SCr of 2.97 mg/dL (H)). Liver Function Tests: Recent Labs  Lab 11/21/2018 1304 11/12/18 1348  AST 22  --   ALT 14  --   ALKPHOS 65  --   BILITOT 0.2*  --   PROT 5.0*  --   ALBUMIN 1.6* 1.3*   No results for input(s): LIPASE, AMYLASE in the last 168 hours. No results for input(s): AMMONIA in the last 168 hours. Coagulation Profile: Recent Labs  Lab 11/22/2018 1304  INR 1.1   Cardiac Enzymes: No results for input(s): CKTOTAL, CKMB, CKMBINDEX, TROPONINI in the last 168 hours. BNP (last 3 results) No results for input(s): PROBNP in the last 8760 hours. HbA1C: No results for input(s): HGBA1C in the last 72 hours. CBG: Recent Labs  Lab 11/15/18 1135 11/15/18 1551 11/15/18 1935 11/15/18 2315 11/16/18 0442  GLUCAP 98 81 82 108* 128*   Lipid Profile: No results for input(s): CHOL, HDL, LDLCALC, TRIG, CHOLHDL, LDLDIRECT in the last 72 hours. Thyroid Function Tests: No results for input(s): TSH, T4TOTAL, FREET4, T3FREE, THYROIDAB in the last 72 hours. Anemia Panel: No results for input(s): VITAMINB12, FOLATE, FERRITIN, TIBC, IRON, RETICCTPCT in the last 72 hours. Sepsis Labs: Recent Labs  Lab 10/27/2018 1304 11/17/2018 1514 11/12/2018 1522 11/11/18 0354  PROCALCITON  --   --  0.20  --   LATICACIDVEN 3.0* 2.5*   --  1.4    Recent Results (from the past 240 hour(s))  Culture, blood (Routine x 2)     Status: Abnormal   Collection Time: 11/03/2018  1:04 PM   Specimen: BLOOD  Result Value Ref Range Status   Specimen Description   Final    BLOOD LEFT ANTECUBITAL Performed at [facility], [address] [place], [place] [address]    Special Requests   Final    BOTTLES DRAWN AEROBIC AND ANAEROBIC Blood Culture adequate volume Performed at [place] [place], [place] [address]    Culture  Setup Time   Final    IN BOTH AEROBIC AND ANAEROBIC BOTTLES GRAM NEGATIVE RODS Gram Stain Report Called to,Read Back By and Verified With: A KOTEY,RN @0523  11/11/18 MKELLY CRITICAL RESULT CALLED TO, READ BACK BY AND VERIFIED WITH: T. BAUMEISTER, PHARMD AT 1000 ON 11/11/18 BY C. JESSUP, MT. Performed at [facility], [place] [address] [place], [place] [address]    Culture PROTEUS VULGARIS (A)  Final   Report Status 11/13/2018 FINAL  Final   Organism ID, Bacteria PROTEUS VULGARIS  Final      Susceptibility   Proteus vulgaris - MIC*    AMPICILLIN >=32 RESISTANT Resistant     CEFAZOLIN >=64 RESISTANT Resistant     CEFEPIME <=1 SENSITIVE Sensitive     CEFTAZIDIME <=1 SENSITIVE Sensitive     CEFTRIAXONE <=1 SENSITIVE Sensitive     CIPROFLOXACIN <=0.25 SENSITIVE Sensitive     GENTAMICIN <=1 SENSITIVE Sensitive     IMIPENEM 1 SENSITIVE Sensitive     TRIMETH/SULFA <=20 SENSITIVE Sensitive     AMPICILLIN/SULBACTAM 8 SENSITIVE Sensitive     PIP/TAZO <=4 SENSITIVE Sensitive     * PROTEUS VULGARIS  Blood Culture ID Panel (Reflexed)     Status: Abnormal   Collection Time: 10/25/2018  1:04 PM  Result Value Ref Range Status   Enterococcus species NOT DETECTED NOT DETECTED Final   Listeria monocytogenes NOT DETECTED NOT DETECTED Final   Staphylococcus species NOT DETECTED NOT DETECTED Final   Staphylococcus aureus (BCID) NOT DETECTED NOT DETECTED Final  Streptococcus species NOT DETECTED NOT  DETECTED Final   Streptococcus agalactiae NOT DETECTED NOT DETECTED Final   Streptococcus pneumoniae NOT DETECTED NOT DETECTED Final   Streptococcus pyogenes NOT DETECTED NOT DETECTED Final   Acinetobacter baumannii NOT DETECTED NOT DETECTED Final   Enterobacteriaceae species DETECTED (A) NOT DETECTED Final    Comment: Enterobacteriaceae represent a large family of gram-negative bacteria, not a single organism. CRITICAL RESULT CALLED TO, READ BACK BY AND VERIFIED WITH: T. BAUMEISTER, PHARMD AT 1000 ON 11/11/18 BY C. JESSUP, MT.    Enterobacter cloacae complex NOT DETECTED NOT DETECTED Final   Escherichia coli NOT DETECTED NOT DETECTED Final   Klebsiella oxytoca NOT DETECTED NOT DETECTED Final   Klebsiella pneumoniae NOT DETECTED NOT DETECTED Final   Proteus species DETECTED (A) NOT DETECTED Final    Comment: CRITICAL RESULT CALLED TO, READ BACK BY AND VERIFIED WITH: T. BAUMEISTER, PHARMD AT 1000 ON 11/11/18 BY C. JESSUP, MT.    Serratia marcescens NOT DETECTED NOT DETECTED Final   Carbapenem resistance NOT DETECTED NOT DETECTED Final   Haemophilus influenzae NOT DETECTED NOT DETECTED Final   Neisseria meningitidis NOT DETECTED NOT DETECTED Final   Pseudomonas aeruginosa NOT DETECTED NOT DETECTED Final   Candida albicans NOT DETECTED NOT DETECTED Final   Candida glabrata NOT DETECTED NOT DETECTED Final   Candida krusei NOT DETECTED NOT DETECTED Final   Candida parapsilosis NOT DETECTED NOT DETECTED Final   Candida tropicalis NOT DETECTED NOT DETECTED Final    Comment: Performed at [facility], [place] [address], [place], [place] [address]  SARS Coronavirus 2 [facility] order, Performed in [facility] hospital lab) Nasopharyngeal Nasopharyngeal Swab     Status: None   Collection Time: 11/08/2018  3:12 PM   Specimen: Nasopharyngeal Swab  Result Value Ref Range Status   SARS Coronavirus 2 NEGATIVE NEGATIVE Final    Comment: (NOTE) If result is NEGATIVE SARS-CoV-2 target nucleic acids  are NOT DETECTED. The SARS-CoV-2 RNA is generally detectable in upper and lower  respiratory specimens during the acute phase of infection. The lowest  concentration of SARS-CoV-2 viral copies this assay can detect is 250  copies / mL. A negative result does not preclude SARS-CoV-2 infection  and should not be used as the sole basis for treatment or other  patient management decisions.  A negative result may occur with  improper specimen collection / handling, submission of specimen other  than nasopharyngeal swab, presence of viral mutation(s) within the  areas targeted by this assay, and inadequate number of viral copies  (<250 copies / mL). A negative result must be combined with clinical  observations, patient history, and epidemiological information. If result is POSITIVE SARS-CoV-2 target nucleic acids are DETECTED. The SARS-CoV-2 RNA is generally detectable in upper and lower  respiratory specimens dur ing the acute phase of infection.  Positive  results are indicative of active infection with SARS-CoV-2.  Clinical  correlation with patient history and other diagnostic information is  necessary to determine patient infection status.  Positive results do  not rule out bacterial infection or co-infection with other viruses. If result is PRESUMPTIVE POSTIVE SARS-CoV-2 nucleic acids MAY BE PRESENT.   A presumptive positive result was obtained on the submitted specimen  and confirmed on repeat testing.  While 2019 novel coronavirus  (SARS-CoV-2) nucleic acids may be present in the submitted sample  additional confirmatory testing may be necessary for epidemiological  and / or clinical management purposes  to differentiate between  SARS-CoV-2 and  other Sarbecovirus currently known to infect humans.  If clinically indicated additional testing with an alternate test  methodology [phone]) is advised. The SARS-CoV-2 RNA is generally  detectable in upper and lower respiratory sp ecimens  during the acute  phase of infection. The expected result is Negative. Fact Sheet for Patients:  StrictlyIdeas.no Fact Sheet for Healthcare Providers: BankingDealers.co.za This test is not yet approved or cleared by the [place] FDA and has been authorized for detection and/or diagnosis of SARS-CoV-2 by FDA under an Emergency Use Authorization (EUA).  This EUA will remain in effect (meaning this test can be used) for the duration of the COVID-19 declaration under Section 564(b)(1) of the Act, 21 U.S.C. section 360bbb-3(b)(1), unless the authorization is terminated or revoked sooner. Performed at [facility], [address], [place], [place] [address]   Culture, blood (Routine x 2)     Status: None   Collection Time: 11/20/2018  3:14 PM   Specimen: Left Antecubital; Blood  Result Value Ref Range Status   Specimen Description LEFT ANTECUBITAL  Final   Special Requests   Final    BOTTLES DRAWN AEROBIC ONLY Blood Culture adequate volume   Culture   Final    NO GROWTH 5 DAYS Performed at [facility], [address], [place], [place] [address]    Report Status 11/15/2018 FINAL  Final  MRSA PCR Screening     Status: None   Collection Time: 11/01/2018 10:50 PM   Specimen: Nasopharyngeal  Result Value Ref Range Status   MRSA by PCR NEGATIVE NEGATIVE Final    Comment:        The GeneXpert MRSA Assay (FDA approved for NASAL specimens only), is one component of a comprehensive MRSA colonization surveillance program. It is not intended to diagnose MRSA infection nor to guide or monitor treatment for MRSA infections. Performed at [facility], [place] [address] [place], [place] [address]   C difficile quick scan w PCR reflex     Status: None   Collection Time:  10:52 PM   Specimen: STOOL  Result Value Ref Range Status   C Diff antigen NEGATIVE NEGATIVE Final   C Diff toxin NEGATIVE NEGATIVE Final   C Diff interpretation No  C. difficile detected.  Final    Comment: Performed at [facility], [place] [address], [place], [place] [address]  Urine Culture     Status: Abnormal   Collection Time: 11/11/18  8:33 PM   Specimen: Urine, Random  Result Value Ref Range Status   Specimen Description URINE, RANDOM  Final   Special Requests   Final    NONE Performed at [facility], [place] [address], [place], [place] [address]    Culture (A)  Final    >=100,000 COLONIES/mL STAPHYLOCOCCUS SPECIES (COAGULASE NEGATIVE)   Report Status 11/14/2018 FINAL  Final   Organism ID, Bacteria STAPHYLOCOCCUS SPECIES (COAGULASE NEGATIVE) (A)  Final      Susceptibility   Staphylococcus species (coagulase negative) - MIC*    CIPROFLOXACIN >=8 RESISTANT Resistant     GENTAMICIN <=0.5 SENSITIVE Sensitive     NITROFURANTOIN <=16 SENSITIVE Sensitive     OXACILLIN >=4 RESISTANT Resistant     TETRACYCLINE <=1 SENSITIVE Sensitive     VANCOMYCIN <=0.5 SENSITIVE Sensitive     TRIMETH/SULFA <=10 SENSITIVE Sensitive     CLINDAMYCIN <=0.25 SENSITIVE Sensitive     RIFAMPIN <=0.5 SENSITIVE Sensitive     Inducible Clindamycin NEGATIVE Sensitive     * >=  100,000 COLONIES/mL STAPHYLOCOCCUS SPECIES (COAGULASE NEGATIVE)      Radiology Studies: No results found.  Scheduled Meds: . aspirin EC  81 mg Oral Daily  . atorvastatin  20 mg Oral Daily  . Chlorhexidine Gluconate Cloth  6 each Topical Daily  . escitalopram  5 mg Oral Daily  . feeding supplement  1 Container Oral TID BM  . feeding supplement (PRO-STAT SUGAR FREE 64)  30 mL Oral TID WC  . fludrocortisone  0.1 mg Oral Daily  . heparin  5,000 Units Subcutaneous Q8H  . lipase/protease/amylase  12,000 Units Oral TID AC  . midodrine  10 mg Oral TID WC  . multivitamin  1 tablet Oral QHS  . pantoprazole  40 mg Oral BID  . sodium chloride flush  10-40 mL Intracatheter Q12H   Continuous Infusions: . sodium chloride    . sodium chloride    . cefTRIAXone (ROCEPHIN)  IV Stopped (11/16/18  1131)  . dextrose 100 mL/hr at 11/17/18 0452  . potassium chloride       LOS: 7 days   Time spent: 25 minutes   Darliss Cheney, MD [facility] Pager [phone]  If 7PM-7AM, please contact night-coverage [email or web address] Password [email or web address] 11/17/2018, 8:11 AM

## 2018-11-17 NOTE — Progress Notes (Signed)
Pt MEWS score 2/yellow. Not an acute change. Will continue to monitor.

## 2018-11-17 NOTE — Progress Notes (Signed)
CRITICAL VALUE ALERT  Critical Value:  Sodium 119  Date & Time Notied:  11/17/2018 at [place]  Provider Notified: Lamar Blinks and MD Posey Pronto  Orders Received/Actions taken: no new order received this time. Will continue monitor.

## 2018-11-17 NOTE — Progress Notes (Addendum)
Courtney [name] Progress Note    Assessment/ Plan:   Dialysis Orders:TTS [facility] 3h (usual runs 2h) 32.5kg 2/2.5 bath R AVG  Assessment/Plan: 1. Hypotension/Septic Shock- pt with acute on chronic hypotension and has been unable to tolerate outpatient HD this week. GNR bacteremia noted, Proteus and treated with Broad-spectrum antibiotics. Her pressures have increased with appropriate rx. Overall, it looks like she has had functional decline and FTT picture. S/p IHD yesterday, Saturday, with only 500 mL UF.Onflorinef and midodrine.  2.  ESRD- As above. She has not been tolerating outpatient HD due to hypotension.Need to givemidodrineand albuminbefore rxs. -Last HD9/21.  Sister has been advocating to keep her comfortable given lack of improvement and poor QOL.  I am going to hold dialysis pending further input from the family, Jaclyn Shaggy (renal navigator). I would  like to withdraw HD and move to comfort care but family has not consented yet. I've written HD orders for later today for 2-1/2 hrs and 1L UF as tolerated.   3. Hypertension/volume- Off pressors, Bps improved, florinef and midodrine as above  4. Anemia- stable  5. Metabolic bone disease- Check phos levels; 2.1. not on binders. Function of poor nutritional status.  6. Severe Protein-caloric Malnutrition- alb 1.6, poor po intake.supplements. Needs someone to feed her.  7. Dispo: palliative care note appreciated. Fam meeting 9/21 per their notes. Another meeting on 9/23 where family seemed to come to grips that she's not getting better and actually has taken an even further decline from June 2020. In June 2020 she was already not doing well.  Subjective:   Looks worse today; very lethargic and moaning   Objective:   BP (!) 116/58   Pulse 73   Temp 97.7 F (36.5 C) (Axillary)   Resp 20   Ht 4\' 11"  (1.499 m)   Wt 40.6 kg   SpO2 100%   BMI 18.08 kg/m    Intake/Output Summary (Last 24 hours) at 11/17/2018 0733 Last data filed at 11/17/2018 [id number] Gross per 24 hour  Intake 1623.37 ml  Output -  Net 1623.37 ml   Weight change:   Physical Exam: [id number], curled up in bed, less arousable today CVS: RRR Resp: clear anteriorly Abd: thin Ext: severely sarcopenic, 2+ upper and lower LE edema. ACCESS: R AVG + T/B  Imaging: No results found.  Labs: BMET Recent Labs  Lab 11/11/2018 1304 11/11/18 0354 11/12/18 1348 11/13/18 0426 11/16/18 0823 11/16/18 1051 11/17/18 0100  NA 137 138 136 135 123* 122* 119*  K 4.4 3.9 3.6 3.8 2.6* 2.6* 5.5*  CL 105 107 106 106 90* 91* 90*  CO2 21* 21* 18* 23 23 22  20*  GLUCOSE 178* 101* 120* 85 109* 115* 93  BUN 35* 35* 40* 20 18 18 20   CREATININE 5.53* 5.35* 5.79* 3.33* 2.85* 2.94* 2.97*  CALCIUM 7.2* 7.2* 7.3* 7.1* 7.0* 6.9* 7.1*  PHOS  --  3.1 2.7 2.1*  --   --   --    CBC Recent Labs  Lab 10/26/2018 1304  11/13/18 0426 11/16/18 0823 11/16/18 0930 11/16/18 1051  WBC 8.5   < > 6.3 5.9 5.4 5.9  NEUTROABS 6.6  --   --  5.0  --   --   HGB 11.8*   < > 7.5* 6.7* 6.3* 6.5*  HCT 38.7   < > 23.1* 20.2* 18.9* 19.2*  MCV 93.0   < > 89.5 87.4 87.9 87.3  PLT 155   < > 81* 76* 67* 71*   < > =  values in this interval not displayed.    Medications:    . aspirin EC  81 mg Oral Daily  . atorvastatin  20 mg Oral Daily  . Chlorhexidine Gluconate Cloth  6 each Topical Daily  . escitalopram  5 mg Oral Daily  . feeding supplement  1 Container Oral TID BM  . feeding supplement (PRO-STAT SUGAR FREE 64)  30 mL Oral TID WC  . fludrocortisone  0.1 mg Oral Daily  . heparin  5,000 Units Subcutaneous Q8H  . lipase/protease/amylase  12,000 Units Oral TID AC  . midodrine  10 mg Oral TID WC  . multivitamin  1 tablet Oral QHS  . pantoprazole  40 mg Oral BID  . sodium chloride flush  10-40 mL Intracatheter Q12H      Otelia Santee, MD 11/17/2018, 7:33 AM

## 2018-11-17 NOTE — Progress Notes (Addendum)
Renal Navigator/LCSW called [name] to check in from meeting yesterday. Courtney [name] was receptive to call and we spoke for 35 minutes, processing her feelings surrounding her mother's GOC. Courtney [name] states she has been preparing herself for this for a long time, but the reality of it feels very different. Navigator validated her feelings. We talked more about stopping dialysis and needing her and her brother's consent. She was adamant that she did not want to continue HD and that she does not want her mother to suffer. She says that her mother has a Living Will and in it she lists that she "does not want to be kept alive by life saving measures if her illness is irreversible." Her daughter adds, "I refuse to go against my mother's wishes." Renal Navigator supports daughter in her determination to carry out her mother's wishes. Renal Navigator broached subject of code status with Courtney [name]. She hesitated and stated that her head knows one thing and her heart feels another. She said, "I can't promise you I won't be yelling for a crash cart if my mom codes while I'm in the room." Renal Navigator clearly stated to [name] that not intervening with treatment does not mean not intervening with care. We will not stop caring for her mother, but if she codes, we talked about what sense it would make for [place] to try to bring her back if we are no longer providing life sustaining measures such as dialysis. We also discussed how detrimental CPR would be for her mother, who currently weighs only 89 lbs. She states understanding, but would like to talk about this when she and her brother arrive in person today. Renal Navigator agrees.  Renal Navigator asked her to call when she and her brother arrive to the hospital. She states her daughters are also coming to see her mother (ages 52 and 86). Renal Navigator updated N. Pickenpack-Cousar. Renal Navigator notified Dr. Lin/Nephrologist of patient's daughter's  confirmed wishes to stop HD for her mother.  Alphonzo Cruise, [place] Renal Navigator [phone]

## 2018-11-18 DIAGNOSIS — Z5320 Procedure and treatment not carried out because of patient's decision for unspecified reasons: Secondary | ICD-10-CM

## 2018-11-18 DIAGNOSIS — Z5329 Procedure and treatment not carried out because of patient's decision for other reasons: Secondary | ICD-10-CM

## 2018-11-18 DIAGNOSIS — N185 Chronic kidney disease, stage 5: Secondary | ICD-10-CM

## 2018-11-18 DIAGNOSIS — Z7189 Other specified counseling: Secondary | ICD-10-CM

## 2018-11-18 LAB — CBC WITH DIFFERENTIAL/PLATELET
Abs Immature Granulocytes: 0.04 10*3/uL (ref 0.00–0.07)
Basophils Absolute: 0 10*3/uL (ref 0.0–0.1)
Basophils Relative: 0 %
Eosinophils Absolute: 0 10*3/uL (ref 0.0–0.5)
Eosinophils Relative: 0 %
HCT: 39.7 % (ref 36.0–46.0)
Hemoglobin: 14 g/dL (ref 12.0–15.0)
Immature Granulocytes: 0 %
Lymphocytes Relative: 3 %
Lymphs Abs: 0.3 10*3/uL — ABNORMAL LOW (ref 0.7–4.0)
MCH: 29.9 pg (ref 26.0–34.0)
MCHC: 35.3 g/dL (ref 30.0–36.0)
MCV: 84.6 fL (ref 80.0–100.0)
Monocytes Absolute: 0.3 10*3/uL (ref 0.1–1.0)
Monocytes Relative: 3 %
Neutro Abs: 11.2 10*3/uL — ABNORMAL HIGH (ref 1.7–7.7)
Neutrophils Relative %: 94 %
Platelets: 62 10*3/uL — ABNORMAL LOW (ref 150–400)
RBC: 4.69 MIL/uL (ref 3.87–5.11)
RDW: 17.2 % — ABNORMAL HIGH (ref 11.5–15.5)
WBC: 11.8 10*3/uL — ABNORMAL HIGH (ref 4.0–10.5)
nRBC: 0 % (ref 0.0–0.2)

## 2018-11-18 MED ORDER — GLYCOPYRROLATE 0.2 MG/ML IJ SOLN: 0.2000 mg | INTRAMUSCULAR | Status: DC | PRN

## 2018-11-18 MED ORDER — OXYCODONE HCL 20 MG/ML PO CONC: 5.0000 mg | ORAL | Status: DC | PRN

## 2018-11-18 MED ORDER — GLYCOPYRROLATE 1 MG PO TABS
1.0000 mg | ORAL_TABLET | ORAL | Status: DC | PRN
  Filled 2018-11-18: qty 1

## 2018-11-18 MED ORDER — HYDROMORPHONE HCL 1 MG/ML IJ SOLN
0.2500 mg | INTRAMUSCULAR | Status: DC | PRN
  Administered 2018-11-18: 18:00:00 0.25 mg via INTRAVENOUS
  Filled 2018-11-18: qty 0.5

## 2018-11-24 NOTE — Progress Notes (Signed)
Renal Navigator met with patient's daughter/[name] and son/James "Nicole Kindred" and Kasie/PMT PA to provide support in making EOL decisions for their mother. [name] concludes that she does not want her mother to suffer and she does not want her to be alone. They are understandably having a very difficult time processing that their mother is dying and there is nothing to do to keep her alive.  It was explained that the current interventions are most likely causing suffering and may actually be speeding the dying process. We discussed goal to keep patient comfortable, peaceful and surrounded by family. Recommendations were made for DNR and transition to full comfort care. Patient's children were understanding and interested in transfer to [facility] in [place]. See PMT note for full details.  Alphonzo Cruise, [place] Renal Navigator [phone]

## 2018-11-24 NOTE — Death Summary Note (Signed)
Death Summary  Courtney [name] [id number] DOB: 1940-11-17 DOA: 2018-11-12  PCP: Neale Burly, MD  Admit date: 2018-11-12 Date of Death: 20-Nov-2018 Time of Death: 10:45 pm  History of present illness:  Patient is a 78 year old female with history of ESRD on dialysis, chronic hypertension, severe protein calorie malnutrition, CVA, diabetes type 2, coronary artery disease, dyslipidemia, chronic diastolic CHF was brought to ED at [facility] due to hypotension, weakness, fatigue.  She was transferred to [facility] for nephrology consultation.  She is wheelchair-bound and confused at baseline.  She was transferred to ICU and received vasopressors for pressure stable septic versus hypovolemic shock.  Nephrology following.  She was on intermittent hemodialysis.  Last hemodialysis was on 9/24.    She remained hypotensive.  Blood cultures revealed Proteus.  Nephrology decided to withdraw hemodialysis and recommended comfort care.  Palliative care consulted and she was started on full cort care. She expired on 11-20-18.  Final Diagnoses:  1.  Gram-negative bacteremia, septic shock   The results of significant diagnostics from this hospitalization (including imaging, microbiology, ancillary and laboratory) are listed below for reference.    Significant Diagnostic Studies: US Abdomen Complete  Result Date: 11/11/2018 CLINICAL DATA:  Sepsis EXAM: ABDOMEN ULTRASOUND COMPLETE COMPARISON:  May 27, 2017 FINDINGS: Gallbladder: There is a mildly prominent gallbladder Klemmer measuring 2.6 mm. There is a small amount of pericholecystic fluid. Common bile duct: Diameter: 3.9 mm Liver: No focal lesion identified. Within normal limits in parenchymal echogenicity. Portal vein is patent on color Doppler imaging with normal direction of blood flow towards the liver. IVC: No abnormality visualized. Pancreas: Not well visualized. Spleen: Size and appearance within normal limits. Right Kidney: Length: Not visualized due to overlying  bowel gas. Left Kidney: Length: Not visualized due to overlying bowel gas. Abdominal aorta: No aneurysm visualized.  Atherosclerotic plaque. Other findings: Bilateral pleural effusions are seen. IMPRESSION: Nonspecific mild gallbladder Shipper prominence and a small amount of pericholecystic fluid. Bilateral pleural effusions. Electronically Signed   By: Prudencio Pair M.D.   On: 11/11/2018 22:53   Dg Chest Portable 1 View  Result Date: November 12, 2018 CLINICAL DATA:  78 year old female has missed dialysis. Hypotension. Line placement. EXAM: PORTABLE CHEST 1 VIEW COMPARISON:  Portable chest 1356 hours today. FINDINGS: Portable AP supine view at 1959 hours. Single lumen left IJ approach central line placed, tip at the lower SVC level. No pneumothorax. Stable cardiac size and mediastinal contours. Moderate bilateral pleural effusions greater on the left. Stable underlying mild pulmonary vascular congestion. Bilateral nipple shadows. Right axillary vascular stent. Visualized tracheal air column is within normal limits. IMPRESSION: 1. Single lumen left IJ central line placed, tip at the lower SVC level. No pneumothorax. 2. Continued moderate bilateral pleural effusions and pulmonary vascular congestion. Electronically Signed   By: Genevie Ann M.D.   On: 11/12/2018 20:31   Dg Chest Port 1 View  Result Date: Nov 12, 2018 CLINICAL DATA:  Hypotension and weakness. EXAM: PORTABLE CHEST 1 VIEW COMPARISON:  Chest x-ray dated 09/24/2018 FINDINGS: There is increased pulmonary vascular congestion with increased small to moderate right pleural effusion. Left effusion is essentially unchanged with some of the fluid shifted to the left apex. Overall heart size is normal. Aortic atherosclerosis. No significant bone abnormality. IMPRESSION: 1. Increasing pulmonary vascular congestion with increased small to moderate right pleural effusion. 2. No change in the left effusion. 3. Aortic atherosclerosis. Electronically Signed   By: Lorriane Shire  M.D.   On: 11-12-2018 14:11   Dg Foot 2  Views Right  Result Date: 11/15/2018 CLINICAL DATA:  And foot ulceration EXAM: RIGHT FOOT - 2 VIEW COMPARISON:  None. FINDINGS: There is diffuse osteopenia which limits evaluation of the osseous structures. There is question lucency seen at the base of the second proximal phalanx at the MTP joint. There is sclerosis seen within the distal tuft of the first digit. No definite area of cortical destruction seen. There is dorsal soft tissue swelling and dense vascular calcifications. IMPRESSION: Diffuse osteopenia limits evaluation of the osseous structures. Question of a nondisplaced fracture seen at the base of the second proximal phalanx at the MTP joint. Sclerosis of the distal tuft of the first digit could be from chronic osteomyelitis or prior injury. Electronically Signed   By: Prudencio Pair M.D.   On: 11/01/2018 16:19    Microbiology: No results found for this or any previous visit (from the past 240 hour(s)).   Labs: Basic Metabolic Panel: Recent Labs  Lab 11/17/18 0100  NA 119*  K 5.5*  CL 90*  CO2 20*  GLUCOSE 93  BUN 20  CREATININE 2.97*  CALCIUM 7.1*   Liver Function Tests: No results for input(s): AST, ALT, ALKPHOS, BILITOT, PROT, ALBUMIN in the last 168 hours. No results for input(s): LIPASE, AMYLASE in the last 168 hours. No results for input(s): AMMONIA in the last 168 hours. CBC: Recent Labs  Lab 11/22/2018 1608  WBC 11.8*  NEUTROABS 11.2*  HGB 14.0  HCT 39.7  MCV 84.6  PLT 62*   Cardiac Enzymes: No results for input(s): CKTOTAL, CKMB, CKMBINDEX, TROPONINI in the last 168 hours. D-Dimer No results for input(s): DDIMER in the last 72 hours. BNP: Invalid input(s): POCBNP CBG: No results for input(s): GLUCAP in the last 168 hours. Anemia work up No results for input(s): VITAMINB12, FOLATE, FERRITIN, TIBC, IRON, RETICCTPCT in the last 72 hours. Urinalysis    Component Value Date/Time   COLORURINE YELLOW 11/12/2018 0100    APPEARANCEUR TURBID (A) 11/12/2018 0100   LABSPEC 1.015 11/12/2018 0100   PHURINE 7.0 11/12/2018 0100   GLUCOSEU NEGATIVE 11/12/2018 0100   HGBUR MODERATE (A) 11/12/2018 0100   BILIRUBINUR NEGATIVE 11/12/2018 0100   KETONESUR NEGATIVE 11/12/2018 0100   PROTEINUR 100 (A) 11/12/2018 0100   UROBILINOGEN 0.2 07/17/2012 1751   NITRITE NEGATIVE 11/12/2018 0100   LEUKOCYTESUR MODERATE (A) 11/12/2018 0100   Sepsis Labs Invalid input(s): PROCALCITONIN,  WBC,  LACTICIDVEN     SIGNED:  Shelly Coss, MD  [facility] 11/23/2018, 1:22 PM Pager [phone]  If 7PM-7AM, please contact night-coverage [email or web address] Password [email or web address]

## 2018-11-24 NOTE — Progress Notes (Signed)
Daily Progress Note   Patient Name: Courtney [name]       Date: 2018/11/27 DOB: 1941-01-29  Age: 78 y.o. MRN#: [id number] Attending Physician: Shelly Coss, MD Primary Care Physician: Neale Burly, MD Admit Date: 10/28/2018  Reason for Consultation/Follow-up: Establishing goals of care and Terminal Care  Subjective: Met with patient's daughter and son. Reviewed patient's medical status and plan of care. Noted decision has been made to stop dialysis. Given this decision we discussed other measures that would be included in transitioning to full comfort care vs continuing aggressive medical care. Code status was discussed. After extended discussion it was determined that overall GOC for patient were for her to experience "no suffering and not die alone" as she declines into unavoidable EOL due to no further dialysis, septic shock, and FTT.  Continuing IV fluids, IV antibiotics, and continuing aggressive medical care in the setting of no further dialysis would likely increase suffering and would not provide further comfort or prolong patient's lifetime in the setting of no further hemodialysis.  At close of prolonged discussion all were in agreement with transition to full comfort care which included only interventions intended to provide comfort and referral for placement at residential Hospice in [place]. Review of Systems  Unable to perform ROS: Dementia    Length of Stay: 8  Current Medications: Scheduled Meds:  . aspirin EC  81 mg Oral Daily  . Chlorhexidine Gluconate Cloth  6 each Topical Daily  . escitalopram  5 mg Oral Daily  . feeding supplement  1 Container Oral TID BM  . feeding supplement (PRO-STAT SUGAR FREE 64)  30 mL Oral TID WC    Continuous Infusions:   PRN Meds:  acetaminophen **OR** acetaminophen, alteplase, alteplase, glycopyrrolate **OR** glycopyrrolate **OR** glycopyrrolate, HYDROmorphone (DILAUDID) injection, ondansetron **OR** ondansetron (ZOFRAN) IV, oxyCODONE, traMADol  Physical Exam Vitals signs and nursing note reviewed.  Constitutional:      Appearance: She is ill-appearing.     Comments: Diffuse anasarca  Pulmonary:     Breath sounds: Rhonchi present.  Skin:    Coloration: Skin is pale.     Comments: Scattered bruising and petiechiae  Neurological:     Mental Status: She is disoriented.             Vital Signs: BP 98/63   Pulse 72  Temp (!) 97.2 F (36.2 C) (Axillary)   Resp 16   Ht 4' 11" (1.499 m)   Wt 40.6 kg   SpO2 100%   BMI 18.08 kg/m  SpO2: SpO2: 100 % O2 Device: O2 Device: Room Air O2 Flow Rate:    Intake/output summary:   Intake/Output Summary (Last 24 hours) at 2018/12/17 1656 Last data filed at 12/17/2018 1600 Gross per 24 hour  Intake 860 ml  Output -  Net 860 ml   LBM: Last BM Date: 11/17/18 Baseline Weight: Weight: 35.9 kg Most recent weight: Weight: 40.6 kg       Palliative Assessment/Data: PPS: 10%      Patient Active Problem List   Diagnosis Date Noted  . ESRD on hemodialysis ([place])   . General weakness   . Hypotension 11/09/2018  . Sepsis ([place]) 10/29/2018  . FTT (failure to thrive) in adult   . Recurrent pleural effusion on left 09/01/2018  . Chronic diastolic CHF (congestive heart failure) ([place]) 09/01/2018  . History of stroke 09/01/2018  . Excessive somnolence disorder 09/01/2018  . Goals of care, counseling/discussion   . Palliative care by specialist   . Protein-calorie malnutrition, severe 08/09/2018  . Hypotensive episode 08/07/2018  . GI bleed 08/07/2018  . Shock circulatory ([place])   . S/P thoracentesis   . Pneumothorax on left   . Pressure injury of skin 08/04/2018  . Symptomatic anemia 07/30/2018  . Acute CVA (cerebrovascular accident) ([place]) 07/13/2017  . Hypokalemia  07/13/2017  . Anemia in ESRD (end-stage renal disease) ([place]) 07/13/2017  . Hyperlipidemia 07/13/2017  . Coronary artery disease 07/13/2017  . Type 2 diabetes mellitus ([place]) 07/13/2017  . ESRD on dialysis ([place]) 02/10/2017  . Intertrochanteric fracture of right hip ([place]) 08/16/2012  . Syncope 07/17/2012  . Closed right hip fracture ([place]) 07/17/2012  . HTN (hypertension) 07/17/2012  . Cerebrovascular disease 07/17/2012    Palliative Care Assessment & Plan   Patient Profile: Palliative Care consult requested for this 78 y.o. female with multiple medical problems including ESRD on HD TTS,chronic hypotension (midodrine), severe protein-calorie malnutrition, CVA with right-sided weakness,type 2 diabetes, CAD/MI, hyperlipidemia, right foot ulcer, and chronic diastolic CHF. Patient presented to [facility] ED from her dialysis center due to severe hypotension. It was reported that she missed her previous 2 HD appointments. CXR showed pulmonary vascular congestion and bilateral pleural effusions. BP 65/49. Patient was given 2.5 L IV fluid resuscitation and started on Zosyn. Patient was transferred to [facility] for HD. Right foot x-ray showed Diffuse osteopenia limits evaluation of the osseous structures.Question of a nondisplaced fracture seen at the base of the second proximal phalanx at the MTP joint. Sclerosis of the distal tuft of the first digit could be from chronic osteomyelitis or prior injury. Since her admission patient was initially treated with Levophed for pressure support but has since weaned off. She remains hypotensive on Midodrine. Nephrology is following however patient continues to have difficulty with tolerating HD due to hypotension. Palliative Medicine team consulted for goals of care.   Assessment/Recommendations/Plan   No further HD  Transition to full comfort  Oxycodone solution 19m q1hr prn pain or SOB  Lorazepam .27mq6hr prn anxiety or SOB  D/C IV fluids,  antibiotics, labs  Social work consult for referral to residential Hospice  Goals of Care and Additional Recommendations:  Limitations on Scope of Treatment: Full Comfort Care  Code Status:  DNR  Prognosis:   < 2 weeks  Discharge Planning:  Hospice facility  Care plan was discussed with patient's children and Micah Noel, LCSW- Renal Navigator.  Thank you for allowing the Palliative Medicine Team to assist in the care of this patient.   Time In: 1500 Time Out: 1630 Total Time 90 mins Prolonged Time Billed yes      Greater than 50%  of this time was spent counseling and coordinating care related to the above assessment and plan.  Mariana Kaufman, AGNP-C Palliative Medicine   Please contact Palliative Medicine Team phone at [phone] for questions and concerns.

## 2018-11-24 NOTE — Progress Notes (Signed)
Nutrition Follow-up  DOCUMENTATION CODES:   Underweight  INTERVENTION:   -Continue Boost Breeze po TID, each supplement provides 250 kcal and 9 grams of protein -Continue 30 ml Prostat TID, each supplement provides 100 kcals and 15 grams protein -Continue renal MVI daily -Continue with liberalized diet of regular  NUTRITION DIAGNOSIS:   Increased nutrient needs related to wound healing, chronic illness(ESRD on HD, CHF) as evidenced by estimated needs.  Ongoing  GOAL:   Patient will meet greater than or equal to 90% of their needs  Unmet  MONITOR:   PO intake, Supplement acceptance, Weight trends, Skin, I & O's, Labs  REASON FOR ASSESSMENT:   Consult Assessment of nutrition requirement/status  ASSESSMENT:   78 year old female who presented to the ED on 9/17 with hypotension. PMH of ESRD on HD, chronic hypotension on midodrine, severe protein-calorie malnutrition, prior CVA, T2DM, CAD/MI, dyslipidemia, CHF.  Reviewed I/O's: +600 ml x 24 hours and +8.2 L since admission  Last HD 11/14/18.   Chart reviewed. Comfort care is being strongly recommending (including stopping HD), however, family is having difficulty coming to terms with transitioning to comfort.   Family meeting planned with palliative care later on today.   Pt has been too lethargic to take in foods or medications. Noted meal completion 0%. Pt has been unable to take medications, PO's, and supplements per MAR. A topic of feeding tube has been broached in the past, but family has refused this option. Given likely transition to comfort care, feeding tube placement would not be appropriate at this time.   Highly suspect pt has malnutrition given underweight status and previous hospitalizations recognizing malnutrition. In an effort to preserve PPE, RD unable to enter pt room to perform nutrition-focused physical exam.    Labs reviewed: Na: 123, K: 2.6.   Diet Order:   Diet Order            Diet regular Room  service appropriate? Yes; Fluid consistency: Thin  Diet effective now              EDUCATION NEEDS:   Not appropriate for education at this time  Skin:  Skin Assessment: Skin Integrity Issues: Skin Integrity Issues:: DTI, Stage II, Unstageable DTI: left heel Stage II: right sacrum Unstageable: right foot  Last BM:  11/17/18  Height:   Ht Readings from Last 1 Encounters:  11/14/2018 4\' 11"  (1.499 m)    Weight:   Wt Readings from Last 1 Encounters:  11/15/18 40.6 kg    Ideal Body Weight:  43.2 kg  BMI:  Body mass index is 18.08 kg/m.  Estimated Nutritional Needs:   Kcal:  1250-1450  Protein:  55-70 grams  Fluid:  UOP + 1000 ml    Dajiah Kooi A. Jimmye Norman, RD, LDN, [place] Registered Dietitian II Certified Diabetes Care and Education Specialist Pager: [phone] After hours Pager: [phone]

## 2018-11-24 NOTE — Progress Notes (Signed)
Spoke with Palliative care team and they will contact daughter [name] to give updates on care plan.

## 2018-11-24 NOTE — Progress Notes (Signed)
PROGRESS NOTE    Courtney [name]  [id number] DOB: Jun 12, 1940 DOA: 11/17/2018 PCP: Neale Burly, MD   Brief Narrative:  Patient is a 78 year old female with history of ESRD on dialysis, chronic hypertension, severe protein calorie malnutrition, CVA, diabetes type 2, coronary artery disease, dyslipidemia, chronic diastolic CHF was brought to ED at [facility] due to hypotension, weakness, fatigue.  She was transferred to [facility] for nephrology consultation.  She is wheelchair-bound and confused at baseline.  She was transferred to ICU and received vasopressors for pressure stable septic versus hypovolemic shock.  Nephrology following.  She was on intermittent hemodialysis.  Last hemodialysis was on 9/24.  Nephrology decided to withdraw hemodialysis and recommended comfort care.  Palliative care assisting.  Assessment & Plan:   Active Problems:   Pressure injury of skin   Hypotension   Sepsis ([place])   ESRD on hemodialysis (HCC)   General weakness   Hypotension/septic shock: She has acute on chronic hypotension and was unable to tolerate outpatient hemodialysis. She was transferred to ICU and received vasopressors for  Septic shock versus hypovolemic shock.   Gram-negative bacteremia: Blood cultures revealed gram-negative rods, Proteus .  On ceftriaxone  ESRD:Nephrology following.  She was on intermittent hemodialysis.  Last hemodialysis was on 9/24.  Nephrology decided to withdraw hemodialysis and recommended comfort care.  Palliative care assisting.  Severe hyponatremia: Hypervolemic hyponatremia due to volume overload.  Acute metabolic encephalopathy: From uremia.  Also has dementia, confused at baseline.  Anemia: Hemoglobin in the range of 6.5 yesterday.  She was transfused with 1 unit of PRBC earlier.  Will recheck CBC.  Decision on transfusion depends upon discussion with palliative care and family.  Severe protein calorie malnutrition: Due to poor oral intake.[place] consulted.   Multiple comorbidities/advanced age/poor prognosis: Palliative care following.  We have recommended comfort care.  Family opinion was divided.  Palliative care meeting with family today.  Nutrition Problem: Increased nutrient needs Etiology: wound healing, chronic illness(ESRD on HD, CHF)      DVT prophylaxis: Heparin [place] Code Status: Ful Family Communication: None.  Family meeting with palliative care today Disposition Plan: To be determined   Consultants: Nephrology, PCCM  Procedures: Dialysis  Antimicrobials:  Anti-infectives (From admission, onward)   Start     Dose/Rate Route Frequency Ordered Stop   11/12/18 1200  vancomycin (VANCOCIN) IVPB 500 mg/100 ml premix  Status:  Discontinued     500 mg 100 mL/hr over 60 Minutes Intravenous Every T-Th-Sa (Hemodialysis) 11/14/2018 1705 11/11/18 1015   11/11/18 1800  piperacillin-tazobactam (ZOSYN) IVPB 2.25 g  Status:  Discontinued     2.25 g 100 mL/hr over 30 Minutes Intravenous Every 8 hours 11/11/18 0953 11/11/18 1015   11/11/18 1030  cefTRIAXone (ROCEPHIN) 2 g in sodium chloride 0.9 % 100 mL IVPB     2 g 200 mL/hr over 30 Minutes Intravenous Every 24 hours 11/11/18 1015     11/11/18 0300  piperacillin-tazobactam (ZOSYN) IVPB 3.375 g  Status:  Discontinued     3.375 g 12.5 mL/hr over 240 Minutes Intravenous Every 12 hours 10/27/2018 1705 11/11/18 0953   10/25/2018 1445  vancomycin (VANCOCIN) IVPB 750 mg/150 ml premix     750 mg 150 mL/hr over 60 Minutes Intravenous  Once 11/09/2018 1432 11/02/2018 2109   11/01/2018 1415  vancomycin (VANCOCIN) 539 mg in sodium chloride 0.9 % 150 mL IVPB  Status:  Discontinued     15 mg/kg  35.9 kg 150 mL/hr over 60 Minutes Intravenous  Once  11/08/2018 1414 11/23/2018 1432   11/16/2018 1415  piperacillin-tazobactam (ZOSYN) IVPB 3.375 g     3.375 g 100 mL/hr over 30 Minutes Intravenous  Once 10/30/2018 1414 10/29/2018 1455      Subjective:  Patient seen and examined the bedside this morning.  Lethargic,  extremely weak and debilitated.  Did not participate with any meaningful conversation and was hardly opening her eyes.  She has severe anasarca.  Objective: Vitals:   Dec 14, 2018 0638 Dec 14, 2018 0800 12-14-2018 1031 Dec 14, 2018 1209  BP: 95/70  108/73 98/63  Pulse: 72     Resp:  16 18 16   Temp: (!) 97 F (36.1 C) (!) 97.3 F (36.3 C)  (!) 97.2 F (36.2 C)  TempSrc: Axillary Axillary  Axillary  SpO2: 100%     Weight:      Height:        Intake/Output Summary (Last 24 hours) at 2018-12-14 1434 Last data filed at December 14, 2018 0900 Gross per 24 hour  Intake 600 ml  Output -  Net 600 ml   Filed Weights   11/14/18 1346 11/14/18 1646 11/15/18 0415  Weight: 41.5 kg 40 kg 40.6 kg    Examination:  General exam: Terminally ill, extremely debilitated, deconditioned HEENT: Ear/Nose normal on gross exam Respiratory system: Bilateral decreased air entry Cardiovascular system: S1 & S2 heard, RRR.  Anasarca gastrointestinal system: Abdomen is nondistended, soft and nontender.  Central nervous system: Not alert and oriented.  Speech inappropriate.  Mumbling Extremities: Anasarca     Data Reviewed: I have personally reviewed following labs and imaging studies  CBC: Recent Labs  Lab 11/12/18 1348 11/13/18 0426 11/16/18 0823 11/16/18 0930 11/16/18 1051  WBC 9.0 6.3 5.9 5.4 5.9  NEUTROABS  --   --  5.0  --   --   HGB 9.1* 7.5* 6.7* 6.3* 6.5*  HCT 27.8* 23.1* 20.2* 18.9* 19.2*  MCV 88.3 89.5 87.4 87.9 87.3  PLT 113* 81* 76* 67* 71*   Basic Metabolic Panel: Recent Labs  Lab 11/12/18 1348 11/13/18 0426 11/16/18 0823 11/16/18 1051 11/17/18 0100  NA 136 135 123* 122* 119*  K 3.6 3.8 2.6* 2.6* 5.5*  CL 106 106 90* 91* 90*  CO2 18* 23 23 22  20*  GLUCOSE 120* 85 109* 115* 93  BUN 40* 20 18 18 20   CREATININE 5.79* 3.33* 2.85* 2.94* 2.97*  CALCIUM 7.3* 7.1* 7.0* 6.9* 7.1*  MG  --  1.7  --   --   --   PHOS 2.7 2.1*  --   --   --    GFR: Estimated Creatinine Clearance: 10.2 mL/min (A)  (by C-G formula based on SCr of 2.97 mg/dL (H)). Liver Function Tests: Recent Labs  Lab 11/12/18 1348  ALBUMIN 1.3*   No results for input(s): LIPASE, AMYLASE in the last 168 hours. No results for input(s): AMMONIA in the last 168 hours. Coagulation Profile: No results for input(s): INR, PROTIME in the last 168 hours. Cardiac Enzymes: No results for input(s): CKTOTAL, CKMB, CKMBINDEX, TROPONINI in the last 168 hours. BNP (last 3 results) No results for input(s): PROBNP in the last 8760 hours. HbA1C: No results for input(s): HGBA1C in the last 72 hours. CBG: Recent Labs  Lab 11/15/18 1135 11/15/18 1551 11/15/18 1935 11/15/18 2315 11/16/18 0442  GLUCAP 98 81 82 108* 128*   Lipid Profile: No results for input(s): CHOL, HDL, LDLCALC, TRIG, CHOLHDL, LDLDIRECT in the last 72 hours. Thyroid Function Tests: No results for input(s): TSH, T4TOTAL, FREET4, T3FREE, THYROIDAB in  the last 72 hours. Anemia Panel: No results for input(s): VITAMINB12, FOLATE, FERRITIN, TIBC, IRON, RETICCTPCT in the last 72 hours. Sepsis Labs: No results for input(s): PROCALCITON, LATICACIDVEN in the last 168 hours.  Recent Results (from the past 240 hour(s))  Culture, blood (Routine x 2)     Status: Abnormal   Collection Time: 11/04/2018  1:04 PM   Specimen: BLOOD  Result Value Ref [place] Status   Specimen Description   Final    BLOOD LEFT ANTECUBITAL Performed at [facility], [address], [place], [place] [address]    Special Requests   Final    BOTTLES DRAWN AEROBIC AND ANAEROBIC Blood Culture adequate volume Performed at [place] [place], [place] [address]    Culture  Setup Time   Final    IN BOTH AEROBIC AND ANAEROBIC BOTTLES GRAM NEGATIVE RODS Gram Stain Report Called to,Read Back By and Verified With: A KOTEY,RN @0523  11/11/18 MKELLY CRITICAL RESULT CALLED TO, READ BACK BY AND VERIFIED WITH: T. BAUMEISTER, PHARMD AT 1000 ON 11/11/18 BY C. JESSUP, MT. Performed at [facility], [place] [address], [place], [place] [address]    Culture PROTEUS VULGARIS (A)  Final   Report Status 11/13/2018 FINAL  Final   Organism ID, Bacteria PROTEUS VULGARIS  Final      Susceptibility   Proteus vulgaris - MIC*    AMPICILLIN >=32 RESISTANT Resistant     CEFAZOLIN >=64 RESISTANT Resistant     CEFEPIME <=1 SENSITIVE Sensitive     CEFTAZIDIME <=1 SENSITIVE Sensitive     CEFTRIAXONE <=1 SENSITIVE Sensitive     CIPROFLOXACIN <=0.25 SENSITIVE Sensitive     GENTAMICIN <=1 SENSITIVE Sensitive     IMIPENEM 1 SENSITIVE Sensitive     TRIMETH/SULFA <=20 SENSITIVE Sensitive     AMPICILLIN/SULBACTAM 8 SENSITIVE Sensitive     PIP/TAZO <=4 SENSITIVE Sensitive     * PROTEUS VULGARIS  Blood Culture ID Panel (Reflexed)     Status: Abnormal   Collection Time: 11/13/2018  1:04 PM  Result Value Ref [place] Status   Enterococcus species NOT DETECTED NOT DETECTED Final   Listeria monocytogenes NOT DETECTED NOT DETECTED Final   Staphylococcus species NOT DETECTED NOT DETECTED Final   Staphylococcus aureus (BCID) NOT DETECTED NOT DETECTED Final   Streptococcus species NOT DETECTED NOT DETECTED Final   Streptococcus agalactiae NOT DETECTED NOT DETECTED Final   Streptococcus pneumoniae NOT DETECTED NOT DETECTED Final   Streptococcus pyogenes NOT DETECTED NOT DETECTED Final   Acinetobacter baumannii NOT DETECTED NOT DETECTED Final   Enterobacteriaceae species DETECTED (A) NOT DETECTED Final    Comment: Enterobacteriaceae represent a large family of gram-negative bacteria, not a single organism. CRITICAL RESULT CALLED TO, READ BACK BY AND VERIFIED WITH: T. BAUMEISTER, PHARMD AT 1000 ON 11/11/18 BY C. JESSUP, MT.    Enterobacter cloacae complex NOT DETECTED NOT DETECTED Final   Escherichia coli NOT DETECTED NOT DETECTED Final   Klebsiella oxytoca NOT DETECTED NOT DETECTED Final   Klebsiella pneumoniae NOT DETECTED NOT DETECTED Final   Proteus species DETECTED (A) NOT DETECTED Final     Comment: CRITICAL RESULT CALLED TO, READ BACK BY AND VERIFIED WITH: T. BAUMEISTER, PHARMD AT 1000 ON 11/11/18 BY C. JESSUP, MT.    Serratia marcescens NOT DETECTED NOT DETECTED Final   Carbapenem resistance NOT DETECTED NOT DETECTED Final   Haemophilus influenzae NOT DETECTED NOT DETECTED Final   Neisseria meningitidis NOT DETECTED NOT DETECTED Final   Pseudomonas aeruginosa NOT  DETECTED NOT DETECTED Final   Candida albicans NOT DETECTED NOT DETECTED Final   Candida glabrata NOT DETECTED NOT DETECTED Final   Candida krusei NOT DETECTED NOT DETECTED Final   Candida parapsilosis NOT DETECTED NOT DETECTED Final   Candida tropicalis NOT DETECTED NOT DETECTED Final    Comment: Performed at [facility], [place] [address] [place], [place] [address]  SARS Coronavirus 2 [facility] order, Performed in [facility] hospital lab) Nasopharyngeal Nasopharyngeal Swab     Status: None   Collection Time: 11/03/2018  3:12 PM   Specimen: Nasopharyngeal Swab  Result Value Ref [place] Status   SARS Coronavirus 2 NEGATIVE NEGATIVE Final    Comment: (NOTE) If result is NEGATIVE SARS-CoV-2 target nucleic acids are NOT DETECTED. The SARS-CoV-2 RNA is generally detectable in upper and lower  respiratory specimens during the acute phase of infection. The lowest  concentration of SARS-CoV-2 viral copies this assay can detect is 250  copies / mL. A negative result does not preclude SARS-CoV-2 infection  and should not be used as the sole basis for treatment or other  patient management decisions.  A negative result may occur with  improper specimen collection / handling, submission of specimen other  than nasopharyngeal swab, presence of viral mutation(s) within the  areas targeted by this assay, and inadequate number of viral copies  (<250 copies / mL). A negative result must be combined with clinical  observations, patient history, and epidemiological information. If result is POSITIVE SARS-CoV-2 target nucleic  acids are DETECTED. The SARS-CoV-2 RNA is generally detectable in upper and lower  respiratory specimens dur ing the acute phase of infection.  Positive  results are indicative of active infection with SARS-CoV-2.  Clinical  correlation with patient history and other diagnostic information is  necessary to determine patient infection status.  Positive results do  not rule out bacterial infection or co-infection with other viruses. If result is PRESUMPTIVE POSTIVE SARS-CoV-2 nucleic acids MAY BE PRESENT.   A presumptive positive result was obtained on the submitted specimen  and confirmed on repeat testing.  While 2019 novel coronavirus  (SARS-CoV-2) nucleic acids may be present in the submitted sample  additional confirmatory testing may be necessary for epidemiological  and / or clinical management purposes  to differentiate between  SARS-CoV-2 and other Sarbecovirus currently known to infect humans.  If clinically indicated additional testing with an alternate test  methodology [phone]) is advised. The SARS-CoV-2 RNA is generally  detectable in upper and lower respiratory sp ecimens during the acute  phase of infection. The expected result is Negative. Fact Sheet for Patients:  StrictlyIdeas.no Fact Sheet for Healthcare Providers: BankingDealers.co.za This test is not yet approved or cleared by the [place] FDA and has been authorized for detection and/or diagnosis of SARS-CoV-2 by FDA under an Emergency Use Authorization (EUA).  This EUA will remain in effect (meaning this test can be used) for the duration of the COVID-19 declaration under Section 564(b)(1) of the Act, 21 U.S.C. section 360bbb-3(b)(1), unless the authorization is terminated or revoked sooner. Performed at [facility], [address] [place],  [address]   Culture, blood (Routine x 2)     Status: None   Collection Time: 11/22/2018  3:14 PM   Specimen:  Left Antecubital; Blood  Result Value Ref [place] Status   Specimen Description LEFT ANTECUBITAL  Final   Special Requests   Final    BOTTLES DRAWN AEROBIC ONLY Blood Culture adequate volume   Culture  Final    NO GROWTH 5 DAYS Performed at [facility], [address], [place], [place] [address]    Report Status 11/15/2018 FINAL  Final  MRSA PCR Screening     Status: None   Collection Time: 11/07/2018 10:50 PM   Specimen: Nasopharyngeal  Result Value Ref [place] Status   MRSA by PCR NEGATIVE NEGATIVE Final    Comment:        The GeneXpert MRSA Assay (FDA approved for NASAL specimens only), is one component of a comprehensive MRSA colonization surveillance program. It is not intended to diagnose MRSA infection nor to guide or monitor treatment for MRSA infections. Performed at [facility], [place] [address], [place], [place] [address]   C difficile quick scan w PCR reflex     Status: None   Collection Time: 11/02/2018 10:52 PM   Specimen: STOOL  Result Value Ref [place] Status   C Diff antigen NEGATIVE NEGATIVE Final   C Diff toxin NEGATIVE NEGATIVE Final   C Diff interpretation No C. difficile detected.  Final    Comment: Performed at [facility], [place] [address] [place], [place] [address]  Urine Culture     Status: Abnormal   Collection Time: 11/11/18  8:33 PM   Specimen: Urine, Random  Result Value Ref [place] Status   Specimen Description URINE, RANDOM  Final   Special Requests   Final    NONE Performed at [facility], [place] [address] [place], [place] [address]    Culture (A)  Final    >=100,000 COLONIES/mL STAPHYLOCOCCUS SPECIES (COAGULASE NEGATIVE)   Report Status 11/14/2018 FINAL  Final   Organism ID, Bacteria STAPHYLOCOCCUS SPECIES (COAGULASE NEGATIVE) (A)  Final      Susceptibility   Staphylococcus species (coagulase negative) - MIC*    CIPROFLOXACIN >=8 RESISTANT Resistant     GENTAMICIN <=0.5 SENSITIVE Sensitive     NITROFURANTOIN <=16 SENSITIVE  Sensitive     OXACILLIN >=4 RESISTANT Resistant     TETRACYCLINE <=1 SENSITIVE Sensitive     VANCOMYCIN <=0.5 SENSITIVE Sensitive     TRIMETH/SULFA <=10 SENSITIVE Sensitive     CLINDAMYCIN <=0.25 SENSITIVE Sensitive     RIFAMPIN <=0.5 SENSITIVE Sensitive     Inducible Clindamycin NEGATIVE Sensitive     * >=100,000 COLONIES/mL STAPHYLOCOCCUS SPECIES (COAGULASE NEGATIVE)         Radiology Studies: No results found.      Scheduled Meds: . aspirin EC  81 mg Oral Daily  . atorvastatin  20 mg Oral Daily  . Chlorhexidine Gluconate Cloth  6 each Topical Daily  . escitalopram  5 mg Oral Daily  . feeding supplement  1 Container Oral TID BM  . feeding supplement (PRO-STAT SUGAR FREE 64)  30 mL Oral TID WC  . fludrocortisone  0.1 mg Oral Daily  . lipase/protease/amylase  12,000 Units Oral TID AC  . midodrine  10 mg Oral TID WC  . multivitamin  1 tablet Oral QHS  . pantoprazole  40 mg Oral BID  . sodium chloride flush  10-40 mL Intracatheter Q12H   Continuous Infusions: . sodium chloride    . sodium chloride    . cefTRIAXone (ROCEPHIN)  IV 2 g (Nov 30, 2018 1035)  . dextrose 50 mL/hr at 11/17/18 1745     LOS: 8 days    Time spent: 35 mins.More than 50% of that time was spent in counseling and/or coordination of care.      Shelly Coss, MD [facility] Pager [phone]  If 7PM-7AM, please contact night-coverage [email or web address] Password [email or web address] 12-15-2018, 2:34 PM

## 2018-11-24 NOTE — Progress Notes (Signed)
Renal Navigator received call from patient's daughter/[name] who states she and her brother will be at the hospital at 3pm today. She asked to speak with Renal Navigator. Navigator agrees. She states she does not know to discuss DNR with her brother. Renal Navigator suggests that she doesn't and allow the team to discuss this with both of them when they get to the hospital. [name] thanked Navigator and states this is what she would like to do. Renal Navigator updated Primary MD/Dr. Tawanna Solo and PMT RN and NP.  Alphonzo Cruise, [place] Renal Navigator [phone]

## 2018-11-24 NOTE — Progress Notes (Addendum)
Went into patients room and saw patient take her last breath.  She took a breath in and moved her mouth a little bit then I didn't see any more movement.  Pt. Had no lung or heart sounds.  Did not respond to sternal rub.  No blood Pressure and O2 Saturation.  I had a 2nd RN Margarite Gouge verify.  I called her daughter [name] and her son Sanae Filo.  Her daughter was very upset and distraught on the phone.  I told son and daughter that they can come up and see her if they would like.  They opted to come see her.  I notified Arby Barrette NP.  He came up and supplied Death Certificate.  [place] [email or web address].  Will clean up patient and get her ready for family to come see her.

## 2018-11-24 NOTE — Consult Note (Signed)
   [facility] CM Inpatient Consult   12/01/2018  Courtney [name] [birth year] [id number]    Patient screened for potential [facility] CM service needs as benefit from Medicare/ NextGen plan with an extreme high risk of 30% for unplanned readmissions; has 3 hospitalizations and 2 ED visits in the past 6 months.  Patient's primary care provider is Dr. Stoney Bang with [facility].  Medical record review and MD brief narrative reveal as: Patient is a 78 year old female with history of ESRD on dialysis, chronic hypertension, severe protein calorie malnutrition, CVA, diabetes type 2, coronary artery disease, dyslipidemia, chronic diastolic CHF, was brought to ED at [facility] due to hypotension, weakness, fatigue.   She was transferred to [facility] for nephrology consultation.  She is wheelchair-bound and confused at baseline. She was transferred to ICU and received vasopressors for pressure stable septic versus hypovolemic shock.  Nephrology following.  She was on intermittent hemodialysis. Last hemodialysis was on 9/24.  Nephrology decided to withdraw hemodialysis and recommended comfort care.  Palliative care assisting.  Per palliative Care consult note, at close of prolonged discussion with family, all were in agreement with transition to full comfort care which included only interventions intended to provide comfort and referral for placement at residential Hospice in [place].  Patient's Expected discharge: Residential Hospice facility (in [place]).  There are no [facility] follow up needs at this point, patient will have full care under Hospice facility.   For questions and additional information, please call:  Britt Petroni A. Khrystyne Arpin, BSN, RN-BC [facility] Liaison Cell: [phone]

## 2018-11-24 DEATH — deceased

## 2020-11-15 IMAGING — DX PORTABLE CHEST - 1 VIEW
1 series · 1 of 1 positions shown · non-contrast
Comparison: 08/08/2018

CLINICAL DATA: Left-sided pneumothorax status post chest tube
placement

EXAM:
PORTABLE CHEST 1 VIEW

[chest ap]
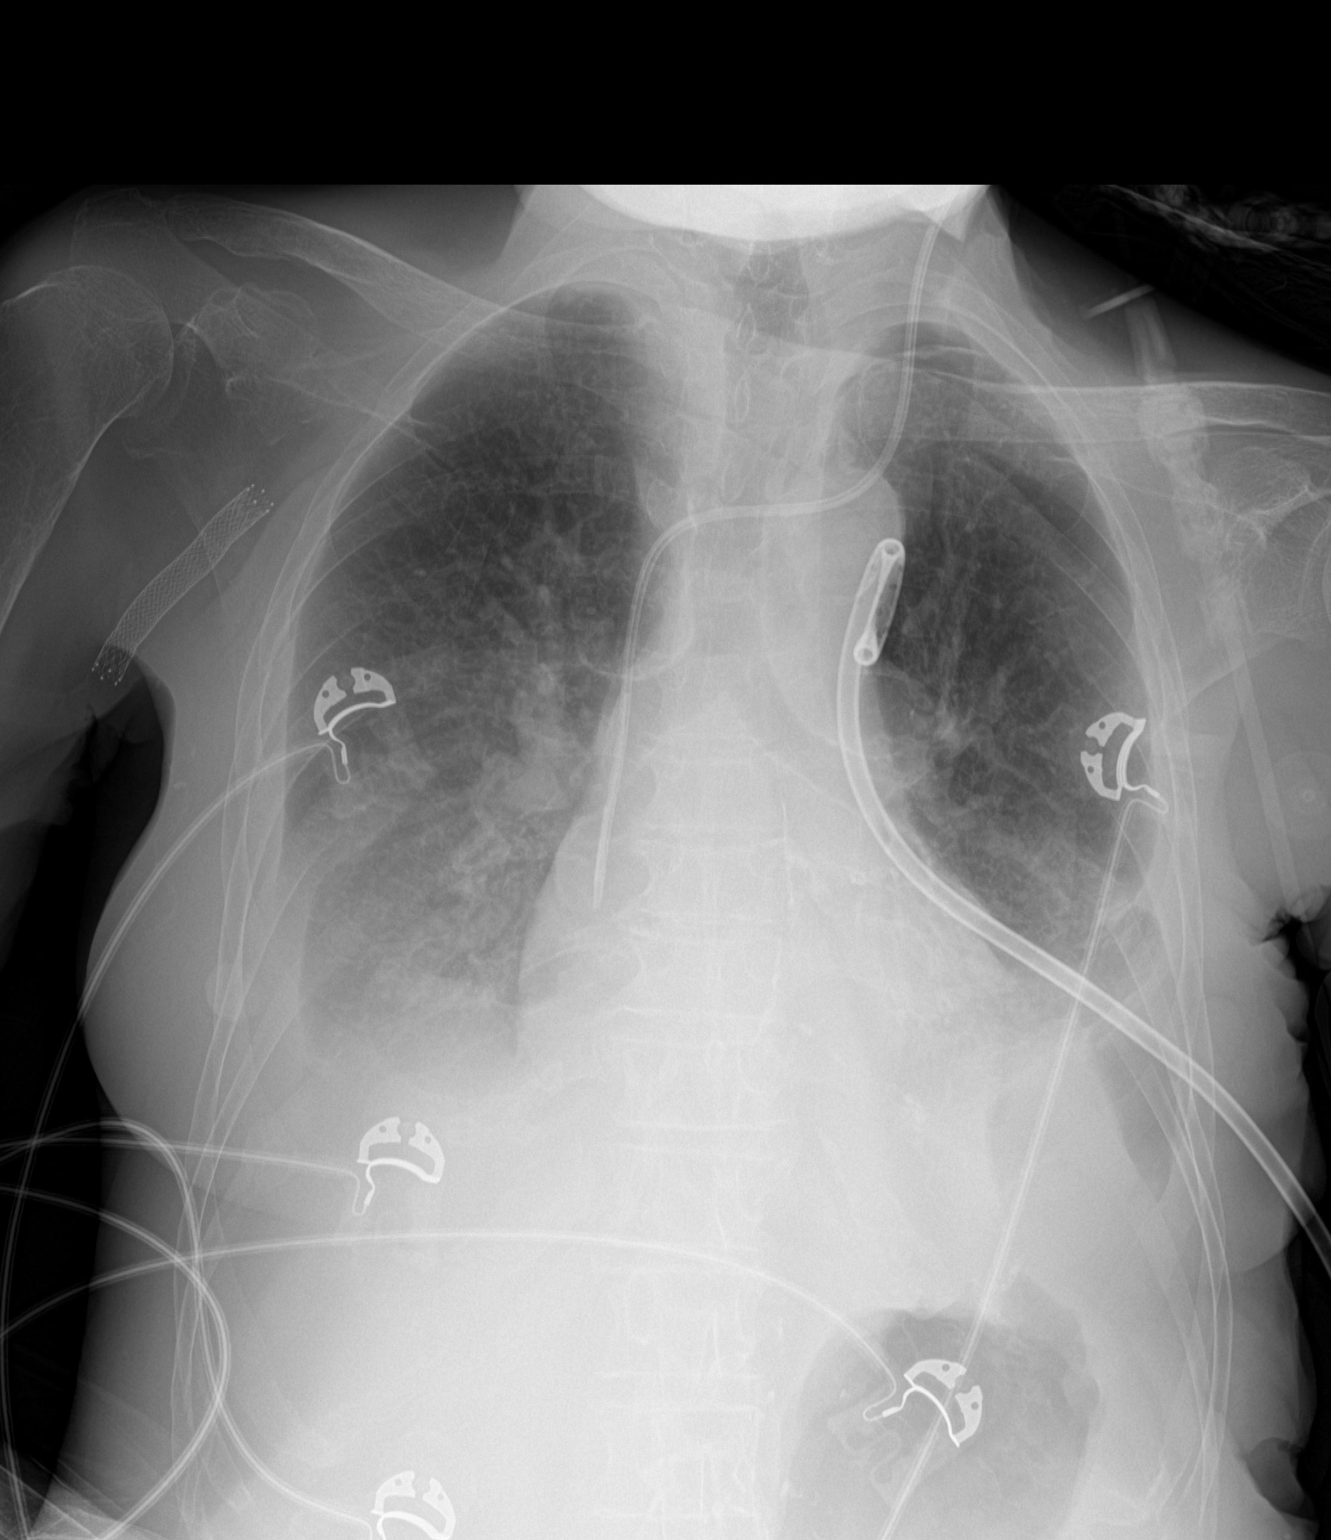

[1 of 1 positions shown; findings below may reference images not displayed]

FINDINGS: The left-sided chest tube and left-sided central venous catheter are
both stable. There is a trace left apical pneumothorax. Bilateral
pleural effusions are again noted which is stable from prior study.
There is no acute osseous abnormality. A vascular stent is noted in
the right axilla. The cardiac size is enlarged but stable from prior
study. There is likely bibasilar atelectasis.
IMPRESSION: 1. Stable lines and tubes.
2. Trace left apical pneumothorax.
3. Persistent bilateral pleural effusions, right greater than left.

## 2020-11-16 IMAGING — DX PORTABLE CHEST - 1 VIEW
1 series · 1 of 1 positions shown · non-contrast
Comparison: 08/09/2018

CLINICAL DATA: Pneumothorax

EXAM:
PORTABLE CHEST 1 VIEW

[chest ap]
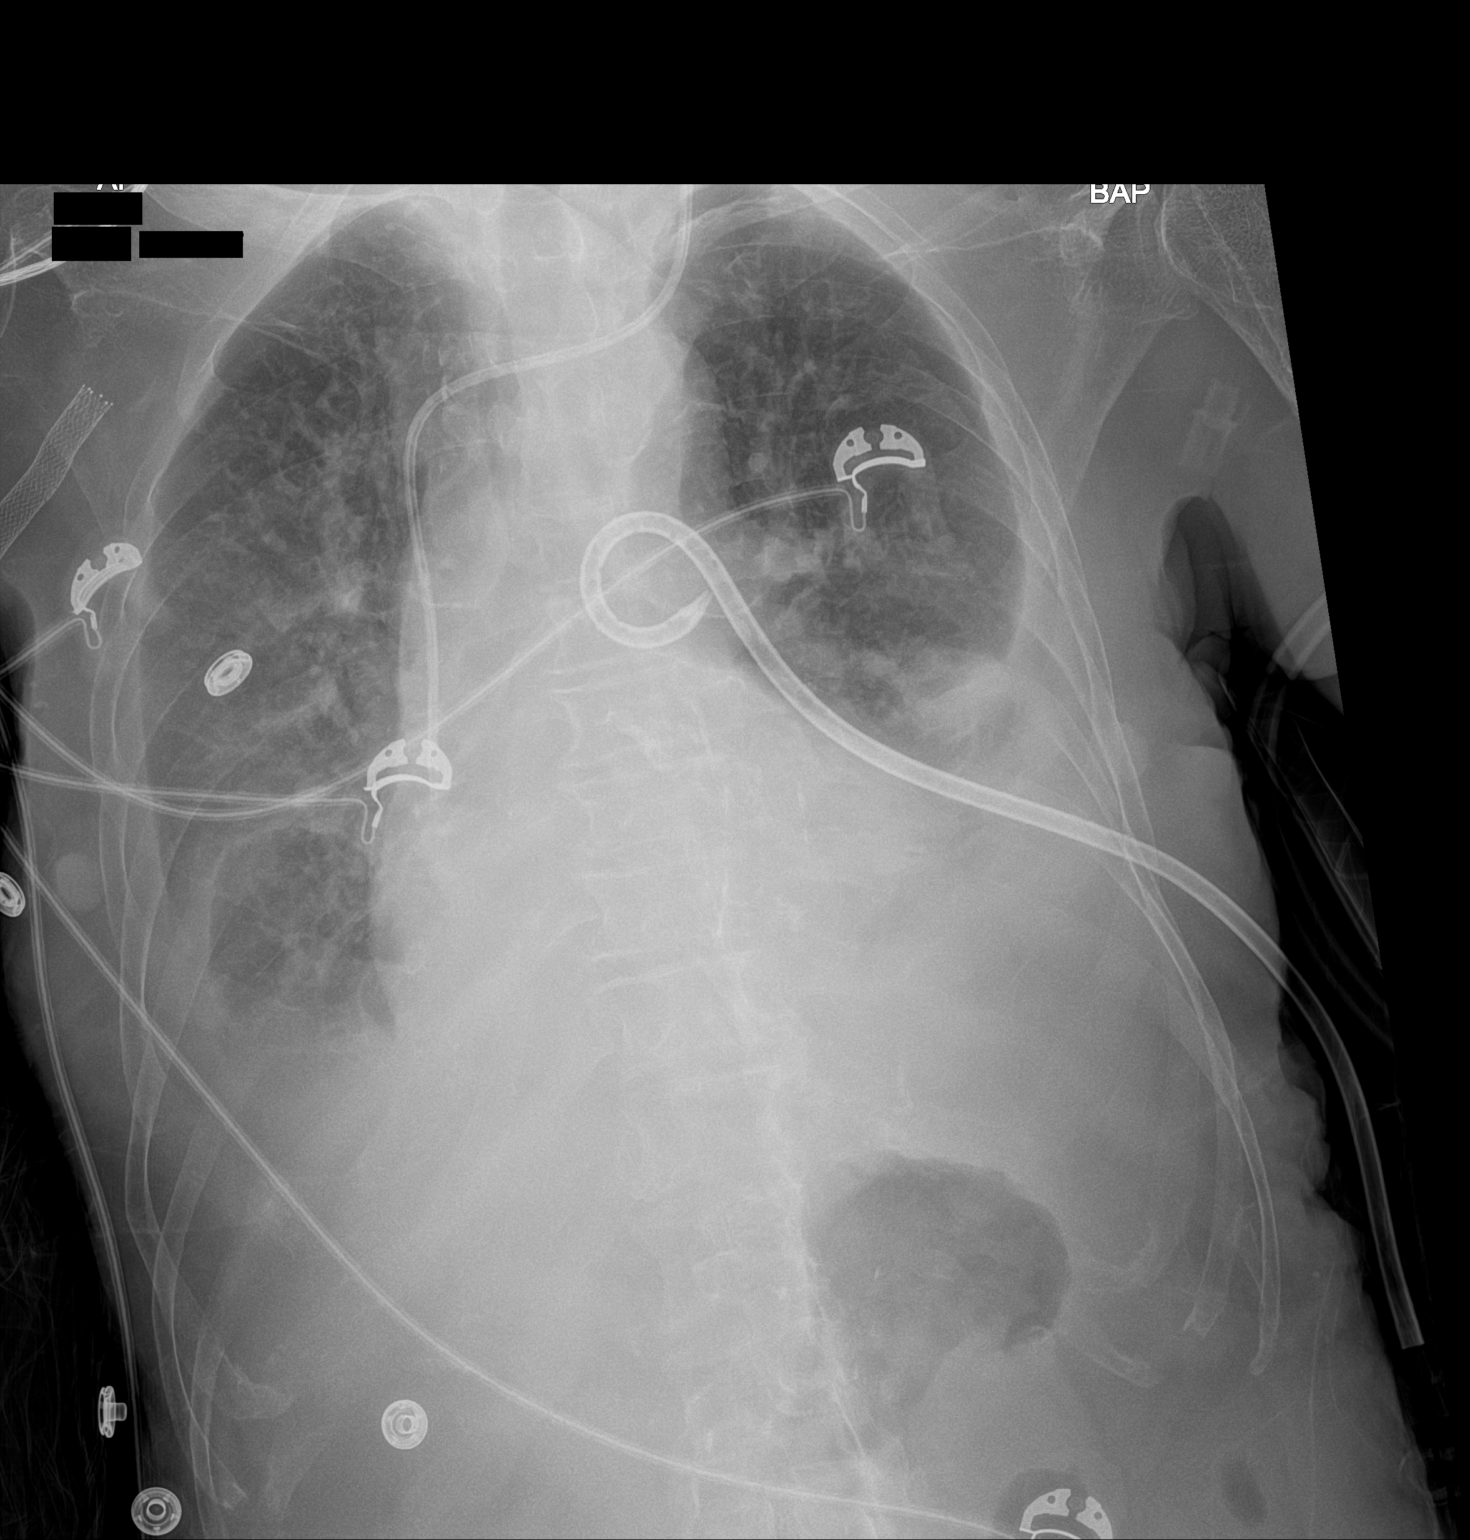

[1 of 1 positions shown; findings below may reference images not displayed]

FINDINGS: The left-sided chest tube is well positioned. There is a probable
trace left apical pneumothorax, better visualized on this
examination secondary to patient positioning. There are persistent
bibasilar airspace opacities and bilateral pleural effusions. Heart
size is stable. The left-sided central venous catheter is stable.
IMPRESSION: 1. Probable trace left apical pneumothorax, better visualized on
this exam secondary to patient positioning.
2. Stable lines and tubes.
3. Persistent bilateral pleural effusions with adjacent airspace
opacities, similar to prior study.

## 2020-11-17 IMAGING — DX PORTABLE CHEST - 1 VIEW
1 series · 1 of 1 positions shown · non-contrast
Comparison: One-view chest x-ray 08/09/2018

CLINICAL DATA: Abnormal respirations.

EXAM:
PORTABLE CHEST 1 VIEW

[chest ap]
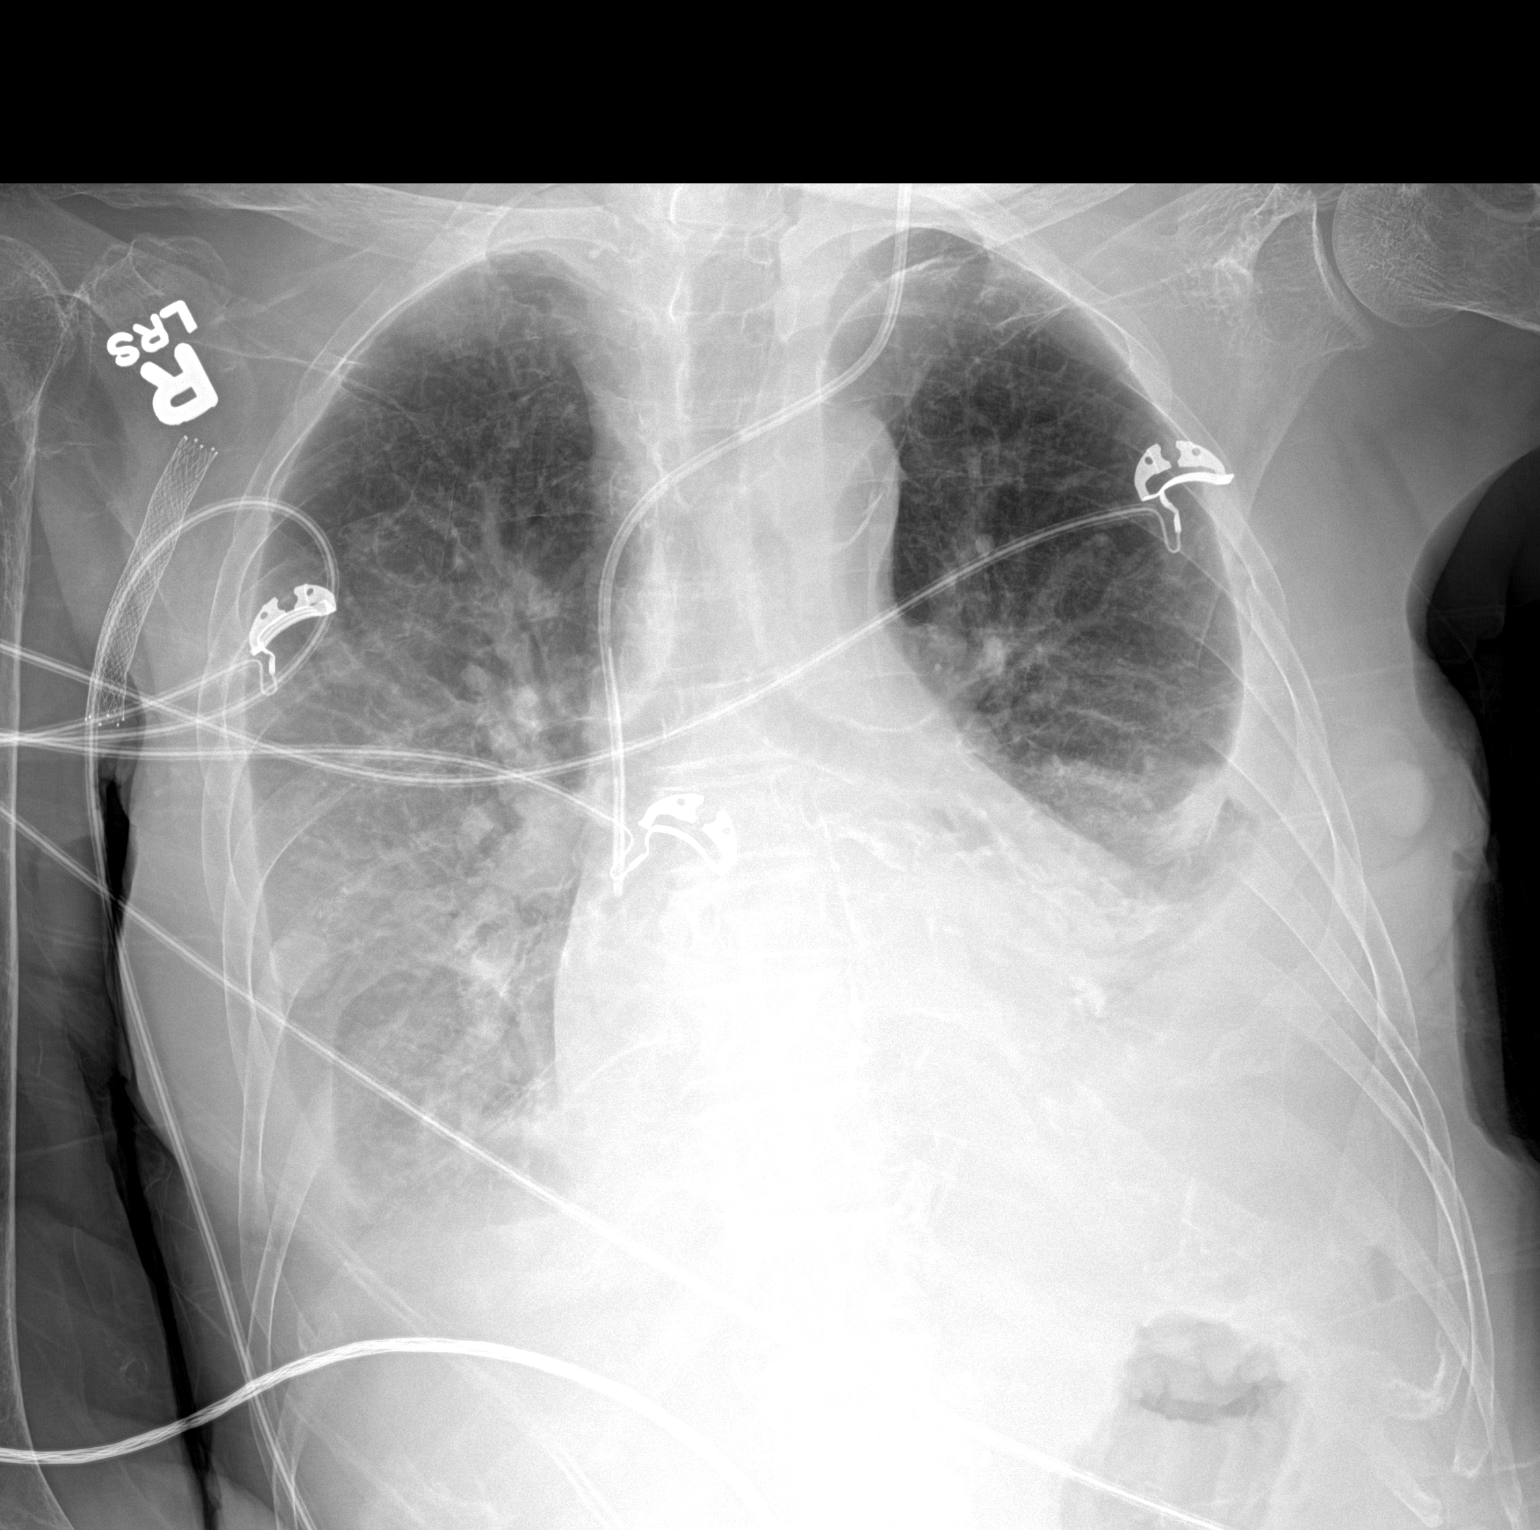

[1 of 1 positions shown; findings below may reference images not displayed]

FINDINGS: The heart is enlarged. Left-sided chest tube was removed. Small
apical pneumothorax remains. Left IJ line is stable.

Diffuse interstitial edema is stable. Right pleural effusion and
airspace disease is stable.
IMPRESSION: 1. Interval removal of left-sided chest tube without significant
change in large left pleural effusion or small atypical
pneumothorax.
2. Stable right pleural effusion.
3. Stable bibasilar airspace disease.
4. Moderate edema unchanged.

## 2020-11-22 IMAGING — DX ABDOMEN - 1 VIEW
1 series · 1 of 1 positions shown · non-contrast
Comparison: 08/13/2018.

CLINICAL DATA: NG tube placement.

EXAM:
ABDOMEN - 1 VIEW

[abdomen kub]
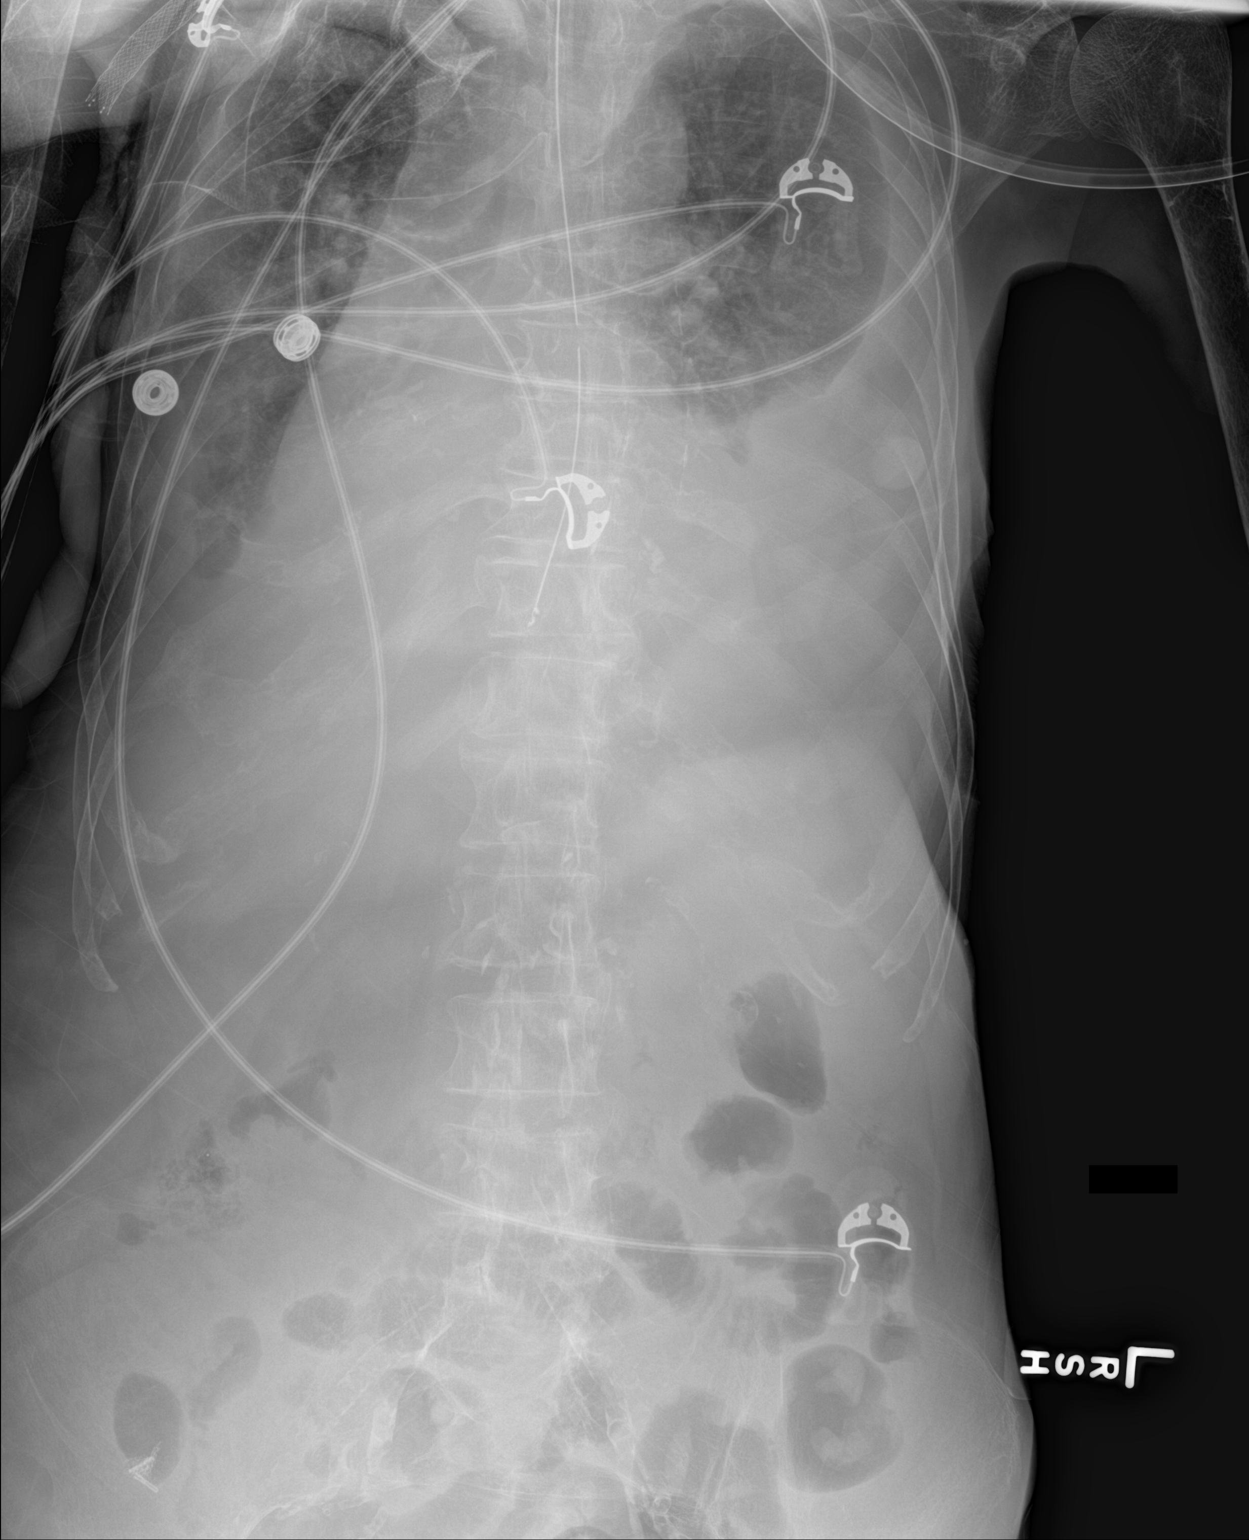

[1 of 1 positions shown; findings below may reference images not displayed]

FINDINGS: NG tube noted with tip over the upper portion of the stomach. Side
hole is noted above the gastroesophageal junction. NG tube
advancement of approximately 15 cm should be considered.
Nondistended air-filled loops of small bowel again noted. Colonic
gas pattern is normal. No free air. Cardiomegaly with bilateral
pulmonary infiltrates/edema and bilateral pleural effusions again
noted.
IMPRESSION: 1. NG tube noted with tip over the upper portion of the stomach.
Side hole is noted above the gastroesophageal junction. NG tube
advancement of approximately 15 cm should be considered.

2.  Nondistended air-filled loops of small bowel again noted.

3. Cardiomegaly with bilateral pulmonary infiltrates/edema bilateral
pleural effusions again noted.
